# Patient Record
Sex: Female | Born: 1995 | Hispanic: No | Marital: Single | Smoking: Current every day smoker
Health system: Southern US, Community
[De-identification: ages and names within clinical notes are randomized; demographics above are authoritative.]

## PROBLEM LIST (undated history)

## (undated) ENCOUNTER — Emergency Department (HOSPITAL_COMMUNITY): Payer: MEDICAID

## (undated) VITALS — BP 124/88 | HR 79 | Temp 97.8°F | Resp 16 | Ht 64.96 in | Wt 159.8 lb

## (undated) VITALS — BP 108/66 | HR 118 | Temp 97.7°F | Resp 18 | Ht 61.61 in | Wt 155.4 lb

## (undated) DIAGNOSIS — F1320 Sedative, hypnotic or anxiolytic dependence, uncomplicated: Secondary | ICD-10-CM

## (undated) DIAGNOSIS — F32A Depression, unspecified: Secondary | ICD-10-CM

## (undated) DIAGNOSIS — T8859XA Other complications of anesthesia, initial encounter: Secondary | ICD-10-CM

## (undated) DIAGNOSIS — H539 Unspecified visual disturbance: Secondary | ICD-10-CM

## (undated) DIAGNOSIS — F1120 Opioid dependence, uncomplicated: Secondary | ICD-10-CM

## (undated) DIAGNOSIS — F1910 Other psychoactive substance abuse, uncomplicated: Secondary | ICD-10-CM

## (undated) DIAGNOSIS — E162 Hypoglycemia, unspecified: Secondary | ICD-10-CM

## (undated) DIAGNOSIS — E785 Hyperlipidemia, unspecified: Secondary | ICD-10-CM

## (undated) DIAGNOSIS — N809 Endometriosis, unspecified: Secondary | ICD-10-CM

## (undated) DIAGNOSIS — R519 Headache, unspecified: Secondary | ICD-10-CM

## (undated) DIAGNOSIS — K219 Gastro-esophageal reflux disease without esophagitis: Secondary | ICD-10-CM

## (undated) DIAGNOSIS — R55 Syncope and collapse: Secondary | ICD-10-CM

## (undated) DIAGNOSIS — F909 Attention-deficit hyperactivity disorder, unspecified type: Secondary | ICD-10-CM

## (undated) DIAGNOSIS — T4145XA Adverse effect of unspecified anesthetic, initial encounter: Secondary | ICD-10-CM

## (undated) DIAGNOSIS — F1410 Cocaine abuse, uncomplicated: Secondary | ICD-10-CM

## (undated) DIAGNOSIS — F419 Anxiety disorder, unspecified: Secondary | ICD-10-CM

## (undated) DIAGNOSIS — F329 Major depressive disorder, single episode, unspecified: Secondary | ICD-10-CM

## (undated) DIAGNOSIS — K819 Cholecystitis, unspecified: Secondary | ICD-10-CM

## (undated) DIAGNOSIS — N946 Dysmenorrhea, unspecified: Secondary | ICD-10-CM

## (undated) DIAGNOSIS — F1994 Other psychoactive substance use, unspecified with psychoactive substance-induced mood disorder: Secondary | ICD-10-CM

## (undated) DIAGNOSIS — D649 Anemia, unspecified: Secondary | ICD-10-CM

## (undated) DIAGNOSIS — Z720 Tobacco use: Secondary | ICD-10-CM

## (undated) DIAGNOSIS — E669 Obesity, unspecified: Secondary | ICD-10-CM

## (undated) HISTORY — DX: Hyperlipidemia, unspecified: E78.5

## (undated) HISTORY — DX: Syncope and collapse: R55

## (undated) HISTORY — DX: Dysmenorrhea, unspecified: N94.6

## (undated) HISTORY — DX: Cholecystitis, unspecified: K81.9

## (undated) HISTORY — DX: Other psychoactive substance abuse, uncomplicated: F19.10

## (undated) HISTORY — DX: Other complications of anesthesia, initial encounter: T88.59XA

## (undated) HISTORY — DX: Headache, unspecified: R51.9

## (undated) HISTORY — DX: Opioid dependence, uncomplicated: F11.20

## (undated) HISTORY — PX: WISDOM TOOTH EXTRACTION: SHX21

## (undated) HISTORY — PX: ADENOIDECTOMY: SUR15

---
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
## 2017-04-02 ENCOUNTER — Other Ambulatory Visit: Payer: Self-pay

## 2017-04-02 ENCOUNTER — Encounter (HOSPITAL_BASED_OUTPATIENT_CLINIC_OR_DEPARTMENT_OTHER): Payer: Self-pay | Admitting: *Deleted

## 2017-04-02 ENCOUNTER — Emergency Department (HOSPITAL_BASED_OUTPATIENT_CLINIC_OR_DEPARTMENT_OTHER)
Admission: EM | Admit: 2017-04-02 | Discharge: 2017-04-02 | Disposition: A | Discharge status: Home / Self Care | Payer: Medicaid Other | Attending: Emergency Medicine | Admitting: Emergency Medicine

## 2017-04-02 DIAGNOSIS — R51 Headache: Secondary | ICD-10-CM | POA: Insufficient documentation

## 2017-04-02 DIAGNOSIS — Z7722 Contact with and (suspected) exposure to environmental tobacco smoke (acute) (chronic): Secondary | ICD-10-CM | POA: Diagnosis not present

## 2017-04-02 DIAGNOSIS — M791 Myalgia, unspecified site: Secondary | ICD-10-CM | POA: Diagnosis not present

## 2017-04-02 LAB — BASIC METABOLIC PANEL
Anion gap: 6 (ref 5–15)
BUN: 13 mg/dL (ref 6–20)
CHLORIDE: 105 mmol/L (ref 101–111)
CO2: 28 mmol/L (ref 22–32)
CREATININE: 0.77 mg/dL (ref 0.44–1.00)
Calcium: 8.7 mg/dL — ABNORMAL LOW (ref 8.9–10.3)
GFR calc Af Amer: >60 mL/min (ref >60)
GFR calc non Af Amer: >60 mL/min (ref >60)
GLUCOSE: 76 mg/dL (ref 65–99)
Potassium: 3.7 mmol/L (ref 3.5–5.1)
Sodium: 139 mmol/L (ref 135–145)

## 2017-04-02 LAB — RAPID URINE DRUG SCREEN, HOSP PERFORMED
AMPHETAMINES: NOT DETECTED
BENZODIAZEPINES: POSITIVE — AB
Barbiturates: NOT DETECTED
Cocaine: NOT DETECTED
OPIATES: NOT DETECTED
Tetrahydrocannabinol: POSITIVE — AB
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
## 2017-04-02 NOTE — Discharge Instructions (Signed)
It was my pleasure taking care of you today!   Increase hydration.  Ibuprofen as needed for pain.  Call your primary care doctor if symptoms are not improved in the next day or two.  Return to ER for new or worsening symptoms, any additional concerns.

## 2017-04-02 NOTE — ED Notes (Signed)
Pt on monitor 
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
## 2017-04-03 ENCOUNTER — Encounter: Payer: Self-pay | Admitting: Obstetrics and Gynecology

## 2017-04-10 ENCOUNTER — Inpatient Hospital Stay (HOSPITAL_COMMUNITY)
Admission: RE | Admit: 2017-04-10 | Discharge: 2017-04-16 | DRG: 897 | Disposition: A | Discharge status: Home / Self Care | Payer: Medicaid Other | Attending: Psychiatry | Admitting: Psychiatry

## 2017-04-10 ENCOUNTER — Other Ambulatory Visit: Payer: Self-pay

## 2017-04-10 ENCOUNTER — Encounter (HOSPITAL_COMMUNITY): Payer: Self-pay | Admitting: *Deleted

## 2017-04-10 ENCOUNTER — Emergency Department (HOSPITAL_COMMUNITY)
Admission: EM | Admit: 2017-04-10 | Discharge: 2017-04-10 | Disposition: A | Discharge status: Home / Self Care | Payer: Medicaid Other | Attending: Emergency Medicine | Admitting: Emergency Medicine

## 2017-04-10 DIAGNOSIS — Z818 Family history of other mental and behavioral disorders: Secondary | ICD-10-CM

## 2017-04-10 DIAGNOSIS — Z885 Allergy status to narcotic agent status: Secondary | ICD-10-CM

## 2017-04-10 DIAGNOSIS — R1030 Lower abdominal pain, unspecified: Secondary | ICD-10-CM | POA: Diagnosis present

## 2017-04-10 DIAGNOSIS — Z810 Family history of intellectual disabilities: Secondary | ICD-10-CM | POA: Diagnosis not present

## 2017-04-10 DIAGNOSIS — F419 Anxiety disorder, unspecified: Secondary | ICD-10-CM | POA: Diagnosis present

## 2017-04-10 DIAGNOSIS — Z91018 Allergy to other foods: Secondary | ICD-10-CM | POA: Diagnosis not present

## 2017-04-10 DIAGNOSIS — F332 Major depressive disorder, recurrent severe without psychotic features: Secondary | ICD-10-CM | POA: Diagnosis present

## 2017-04-10 DIAGNOSIS — F1924 Other psychoactive substance dependence with psychoactive substance-induced mood disorder: Secondary | ICD-10-CM | POA: Diagnosis present

## 2017-04-10 DIAGNOSIS — F902 Attention-deficit hyperactivity disorder, combined type: Secondary | ICD-10-CM | POA: Diagnosis present

## 2017-04-10 DIAGNOSIS — Z5321 Procedure and treatment not carried out due to patient leaving prior to being seen by health care provider: Secondary | ICD-10-CM | POA: Diagnosis not present

## 2017-04-10 DIAGNOSIS — Z7722 Contact with and (suspected) exposure to environmental tobacco smoke (acute) (chronic): Secondary | ICD-10-CM | POA: Diagnosis present

## 2017-04-10 DIAGNOSIS — Z79899 Other long term (current) drug therapy: Secondary | ICD-10-CM | POA: Diagnosis not present

## 2017-04-10 DIAGNOSIS — Z915 Personal history of self-harm: Secondary | ICD-10-CM

## 2017-04-10 DIAGNOSIS — F1920 Other psychoactive substance dependence, uncomplicated: Secondary | ICD-10-CM | POA: Diagnosis present

## 2017-04-10 DIAGNOSIS — E785 Hyperlipidemia, unspecified: Secondary | ICD-10-CM | POA: Diagnosis present

## 2017-04-10 DIAGNOSIS — R45851 Suicidal ideations: Secondary | ICD-10-CM | POA: Diagnosis present

## 2017-04-10 DIAGNOSIS — Z91041 Radiographic dye allergy status: Secondary | ICD-10-CM

## 2017-04-10 DIAGNOSIS — Z560 Unemployment, unspecified: Secondary | ICD-10-CM | POA: Diagnosis not present

## 2017-04-10 DIAGNOSIS — F1994 Other psychoactive substance use, unspecified with psychoactive substance-induced mood disorder: Secondary | ICD-10-CM | POA: Diagnosis present

## 2017-04-10 LAB — COMPREHENSIVE METABOLIC PANEL
ALT: 11 U/L — ABNORMAL LOW (ref 14–54)
ANION GAP: 9 (ref 5–15)
AST: 16 U/L (ref 15–41)
Albumin: 4.1 g/dL (ref 3.5–5.0)
Alkaline Phosphatase: 43 U/L (ref 38–126)
BILIRUBIN TOTAL: 0.9 mg/dL (ref 0.3–1.2)
BUN: 7 mg/dL (ref 6–20)
CO2: 22 mmol/L (ref 22–32)
Calcium: 9.3 mg/dL (ref 8.9–10.3)
Chloride: 111 mmol/L (ref 101–111)
Creatinine, Ser: 0.73 mg/dL (ref 0.44–1.00)
Glucose, Bld: 91 mg/dL (ref 65–99)
POTASSIUM: 4.1 mmol/L (ref 3.5–5.1)
Sodium: 142 mmol/L (ref 135–145)
TOTAL PROTEIN: 6.3 g/dL — AB (ref 6.5–8.1)

## 2017-04-10 LAB — CBC
HEMATOCRIT: 40.7 % (ref 36.0–46.0)
HEMOGLOBIN: 12.9 g/dL (ref 12.0–15.0)
MCH: 26.8 pg (ref 26.0–34.0)
MCHC: 31.7 g/dL (ref 30.0–36.0)
MCV: 84.4 fL (ref 78.0–100.0)
Platelets: 214 10*3/uL (ref 150–400)
RBC: 4.82 MIL/uL (ref 3.87–5.11)
RDW: 13.9 % (ref 11.5–15.5)
WBC: 5.1 10*3/uL (ref 4.0–10.5)

## 2017-04-10 LAB — LIPASE, BLOOD: Lipase: 41 U/L (ref 11–51)

## 2017-04-10 LAB — HCG, QUANTITATIVE, PREGNANCY: hCG, Beta Chain, Quant, S: <1 m[IU]/mL (ref <5)

## 2017-04-10 MED ORDER — CHLORDIAZEPOXIDE HCL 25 MG PO CAPS
25.0000 mg | ORAL_CAPSULE | Freq: Four times a day (QID) | ORAL | Status: DC | PRN
  Administered 2017-04-10: 16:00:00 via ORAL

## 2017-04-10 MED ORDER — QUETIAPINE FUMARATE 50 MG PO TABS
50.0000 mg | ORAL_TABLET | Freq: Once | ORAL | Status: AC
  Administered 2017-04-10: 50 mg via ORAL
  Filled 2017-04-10 (×2): qty 1

## 2017-04-10 MED ORDER — CLONIDINE HCL 0.1 MG PO TABS
0.1000 mg | ORAL_TABLET | ORAL | Status: DC
  Filled 2017-04-10 (×2): qty 1

## 2017-04-10 MED ORDER — LOPERAMIDE HCL 2 MG PO CAPS: 2.0000 mg | ORAL_CAPSULE | ORAL | Status: DC | PRN

## 2017-04-10 MED ORDER — HYDROXYZINE HCL 25 MG PO TABS
25.0000 mg | ORAL_TABLET | Freq: Four times a day (QID) | ORAL | Status: AC | PRN
Start: 2017-04-10 — End: 2017-04-15
  Administered 2017-04-10 – 2017-04-15 (×12): 25 mg via ORAL
  Filled 2017-04-10 (×12): qty 1

## 2017-04-10 MED ORDER — DICYCLOMINE HCL 20 MG PO TABS: 20.0000 mg | ORAL_TABLET | Freq: Four times a day (QID) | ORAL | Status: DC | PRN

## 2017-04-10 MED ORDER — ADULT MULTIVITAMIN W/MINERALS CH
1.0000 | ORAL_TABLET | Freq: Every day | ORAL | Status: DC
  Administered 2017-04-11 – 2017-04-16 (×6): 1 via ORAL
  Filled 2017-04-10 (×10): qty 1

## 2017-04-10 MED ORDER — NAPROXEN 500 MG PO TABS
500.0000 mg | ORAL_TABLET | Freq: Two times a day (BID) | ORAL | Status: AC | PRN
  Administered 2017-04-11 – 2017-04-14 (×5): 500 mg via ORAL
  Filled 2017-04-10 (×5): qty 1

## 2017-04-10 MED ORDER — METHOCARBAMOL 500 MG PO TABS
500.0000 mg | ORAL_TABLET | Freq: Three times a day (TID) | ORAL | Status: DC | PRN
  Administered 2017-04-14: 500 mg via ORAL
  Filled 2017-04-10: qty 1

## 2017-04-10 MED ORDER — CHLORDIAZEPOXIDE HCL 25 MG PO CAPS
ORAL_CAPSULE | ORAL | Status: AC
Start: 2017-04-10 — End: 2017-04-10
  Filled 2017-04-10: qty 1

## 2017-04-10 MED ORDER — CLONIDINE HCL 0.1 MG PO TABS
0.1000 mg | ORAL_TABLET | Freq: Four times a day (QID) | ORAL | Status: DC
  Administered 2017-04-10 – 2017-04-11 (×2): 0.1 mg via ORAL
  Filled 2017-04-10 (×10): qty 1

## 2017-04-10 MED ORDER — CLONIDINE HCL 0.1 MG PO TABS: 0.1000 mg | ORAL_TABLET | Freq: Every day | ORAL | Status: DC

## 2017-04-10 MED ORDER — ONDANSETRON 4 MG PO TBDP: 4.0000 mg | ORAL_TABLET | Freq: Four times a day (QID) | ORAL | Status: AC | PRN
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
## 2017-04-10 NOTE — Progress Notes (Addendum)
Patient ID: [name], female   DOB: April 28, 1995, 22 y.o.   MRN: [id number]  Pt currently presents with a sad affect and cooperative behavior. Pt approaches nurses station multiple times reporting intermittent anxiety and requesting medications. Pt speech is slow, logical, no tremors or shaking noted. Pt reports difficulty sleeping during hospitalization, requests to take sleep aid. Reports she has taken Seroquel in the past. Pt blood pressure low, will consult with NP. Presents with signs and symptoms of withdrawal including fatigue, feeling like her heart is racing and anxiety.    Pt provided with medications per providers orders. Pt's labs and vitals were monitored throughout the night. Pt given a 1:1 about emotional and mental status. Pt supported and encouraged to express concerns and questions. Pt educated on medications and coping skills for anxiety. Provider notified of patients request.   Pt's safety ensured with 15 minute and environmental checks. Pt currently denies SI/HI and A/V hallucinations. Pt verbally agrees to seek staff if SI/HI or A/VH occurs and to consult with staff before acting on any harmful thoughts. Pt reports that coloring works to help decrease her anxiety. Will continue POC.

## 2017-04-10 NOTE — Tx Team (Signed)
Initial Treatment Plan 04/10/2017 4:15 PM [name] [id number]    PATIENT STRESSORS: Educational concerns Marital or family conflict Occupational concerns Substance abuse   PATIENT STRENGTHS: Average or above average intelligence Capable of independent living Communication skills Motivation for treatment/growth Physical Health Supportive family/friends   PATIENT IDENTIFIED PROBLEMS: Risk for self injurious behavior "I was suicidal, I had plan to OD on drugs".  Substance abuse "I drink 1/2 a bottle of Hennessy, 1 gram of cocaine and 7 xenax pill everyday".  Alteration in mood (Depressed, anxious, tearful)                 DISCHARGE CRITERIA:  Improved stabilization in mood, thinking, and/or behavior Verbal commitment to aftercare and medication compliance  PRELIMINARY DISCHARGE PLAN: Outpatient therapy Return to previous living arrangement  PATIENT/FAMILY INVOLVEMENT: This treatment plan has been presented to and reviewed with the patient, [name].  The patient have been given the opportunity to ask questions and make suggestions.  Sherryl MangesWesseh, Lovell Roe, RN 04/10/2017, 4:15 PM

## 2017-04-10 NOTE — ED Triage Notes (Signed)
To ED for eval of lower abd pain. States she has hx of this pain but seems worse today. No vomiting. No diarrhea. States she was told a couple of weeks ago that she was pregnant but unknown how far. Denies vaginal discharge. Hx of endometriosis.
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
## 2017-04-10 NOTE — [facility] Group Notes (Signed)
[facility] Group Notes:  (Nursing/MHT/Case Management/Adjunct)  Date:  04/10/2017  Time:  4:00 pm  Type of Therapy:  Psychoeducational Skills  Participation Level:  Did Not Attend  Participation Quality:    Affect:    Cognitive:    Insight:    Engagement in Group:    Modes of Intervention:    Summary of Progress/Problems:  Earline MayotteKnight, Ariza Evans Shephard 04/10/2017, 7:20 PM

## 2017-04-10 NOTE — H&P (Signed)
Behavioral Health Medical Screening Exam  [name] is an 22 y.o. female presents as walk in with complaints of suicidal ideation; worsening depression, and polysubstance use.  Patient unable to contract for safety   Total Time spent with patient: 45 minutes  Psychiatric Specialty Exam: Physical Exam  Vitals reviewed. Constitutional: She is oriented to person, place, and time.  Neck: Normal range of motion.  Respiratory: Effort normal.  Musculoskeletal: Normal range of motion.  Neurological: She is alert and oriented to person, place, and time.  Skin: Skin is warm and dry.  Psychiatric: Her speech is normal and behavior is normal. She expresses impulsivity. She exhibits a depressed mood. She expresses suicidal ideation. She expresses suicidal plans.    Review of Systems  Psychiatric/Behavioral: Positive for depression, substance abuse and suicidal ideas.  All other systems reviewed and are negative.   Blood pressure 109/67, pulse 72, temperature 98 F (36.7 C), resp. rate 16, last menstrual period 04/02/2017, SpO2 100 %.There is no height or weight on file to calculate BMI.  General Appearance: Casual  Eye Contact:  Good  Speech:  Clear and Coherent and Normal Rate  Volume:  Normal  Mood:  Depressed and Hopeless  Affect:  Depressed and Flat  Thought Process:  Coherent and Goal Directed  Orientation:  Full (Time, Place, and Person)  Thought Content:  Denies hallucinations, delusions, and paranoia  Suicidal Thoughts:  Yes.  with intent/plan  Homicidal Thoughts:  No  Memory:  Immediate;   Good Recent;   Good Remote;   Good  Judgement:  Impaired  Insight:  Lacking and Shallow  Psychomotor Activity:  Normal  Concentration: Concentration: Fair and Attention Span: Fair  Recall:  Good  Fund of Knowledge:Fair  Language: Good  Akathisia:  No  Handed:  Right  AIMS (if indicated):     Assets:  Communication Skills Desire for Improvement Intimacy Social Support  Sleep:        Musculoskeletal: Strength & Muscle Tone: within normal limits Gait & Station: normal Patient leans: N/A  Blood pressure 109/67, pulse 72, temperature 98 F (36.7 C), resp. rate 16, last menstrual period 04/02/2017, SpO2 100 %.  Recommendations:  Inpatient psychiatric treatment  Based on my evaluation the patient does not appear to have an emergency medical condition.  Shuvon Rankin, NP 04/10/2017, 12:24 PM

## 2017-04-11 DIAGNOSIS — Z915 Personal history of self-harm: Secondary | ICD-10-CM

## 2017-04-11 DIAGNOSIS — F332 Major depressive disorder, recurrent severe without psychotic features: Secondary | ICD-10-CM

## 2017-04-11 DIAGNOSIS — F1390 Sedative, hypnotic, or anxiolytic use, unspecified, uncomplicated: Secondary | ICD-10-CM

## 2017-04-11 DIAGNOSIS — F1490 Cocaine use, unspecified, uncomplicated: Secondary | ICD-10-CM

## 2017-04-11 DIAGNOSIS — R45851 Suicidal ideations: Secondary | ICD-10-CM

## 2017-04-11 DIAGNOSIS — F419 Anxiety disorder, unspecified: Secondary | ICD-10-CM

## 2017-04-11 DIAGNOSIS — Z813 Family history of other psychoactive substance abuse and dependence: Secondary | ICD-10-CM

## 2017-04-11 DIAGNOSIS — R450 Nervousness: Secondary | ICD-10-CM

## 2017-04-11 DIAGNOSIS — Z818 Family history of other mental and behavioral disorders: Secondary | ICD-10-CM

## 2017-04-11 DIAGNOSIS — F1920 Other psychoactive substance dependence, uncomplicated: Secondary | ICD-10-CM | POA: Diagnosis present

## 2017-04-11 DIAGNOSIS — F1994 Other psychoactive substance use, unspecified with psychoactive substance-induced mood disorder: Secondary | ICD-10-CM

## 2017-04-11 LAB — LIPID PANEL
CHOLESTEROL: 163 mg/dL (ref 0–200)
HDL: 71 mg/dL (ref >40)
LDL Cholesterol: 81 mg/dL (ref 0–99)
TRIGLYCERIDES: 53 mg/dL (ref <150)
Total CHOL/HDL Ratio: 2.3 RATIO
VLDL: 11 mg/dL (ref 0–40)

## 2017-04-11 LAB — TSH: TSH: 1.037 u[IU]/mL (ref 0.350–4.500)

## 2017-04-11 LAB — ETHANOL: Alcohol, Ethyl (B): <10 mg/dL (ref <10)

## 2017-04-11 MED ORDER — GABAPENTIN 600 MG PO TABS
300.0000 mg | ORAL_TABLET | Freq: Three times a day (TID) | ORAL | Status: DC
  Filled 2017-04-11 (×2): qty 0.5

## 2017-04-11 MED ORDER — QUETIAPINE FUMARATE 25 MG PO TABS
25.0000 mg | ORAL_TABLET | Freq: Every day | ORAL | Status: DC
  Administered 2017-04-12 – 2017-04-16 (×5): 25 mg via ORAL
  Filled 2017-04-11 (×3): qty 1
  Filled 2017-04-11: qty 7
  Filled 2017-04-11: qty 1
  Filled 2017-04-11: qty 7
  Filled 2017-04-11 (×2): qty 1

## 2017-04-11 MED ORDER — GABAPENTIN 300 MG PO CAPS
300.0000 mg | ORAL_CAPSULE | Freq: Three times a day (TID) | ORAL | Status: DC
  Administered 2017-04-11 – 2017-04-13 (×6): 300 mg via ORAL
  Filled 2017-04-11 (×13): qty 1

## 2017-04-11 MED ORDER — LORAZEPAM 1 MG PO TABS
1.0000 mg | ORAL_TABLET | Freq: Four times a day (QID) | ORAL | Status: DC | PRN
  Filled 2017-04-11: qty 1

## 2017-04-11 MED ORDER — QUETIAPINE FUMARATE 100 MG PO TABS
100.0000 mg | ORAL_TABLET | Freq: Every day | ORAL | Status: DC
  Administered 2017-04-11 – 2017-04-15 (×5): 100 mg via ORAL
  Filled 2017-04-11: qty 1
  Filled 2017-04-11: qty 7
  Filled 2017-04-11 (×2): qty 1
  Filled 2017-04-11: qty 7
  Filled 2017-04-11 (×4): qty 1

## 2017-04-11 MED ORDER — BUPROPION HCL ER (XL) 150 MG PO TB24
150.0000 mg | ORAL_TABLET | Freq: Every day | ORAL | Status: DC
  Administered 2017-04-11: 150 mg via ORAL
  Filled 2017-04-11 (×3): qty 1

## 2017-04-11 MED ORDER — CHLORDIAZEPOXIDE HCL 25 MG PO CAPS
25.0000 mg | ORAL_CAPSULE | Freq: Four times a day (QID) | ORAL | Status: DC | PRN
  Administered 2017-04-12: 25 mg via ORAL
  Filled 2017-04-11: qty 1

## 2017-04-11 NOTE — Progress Notes (Signed)
[facility] called [facility] informed that urine specimen was not labeled correctly so it had to be rejected.

## 2017-04-11 NOTE — [facility] Suicide Risk Assessment (Signed)
[facility] Ad[facility]   Nursing information obtained from:    Demographic factors:    Current Mental Status:    Loss Factors:    Historical Factors:    Risk Reduction Factors:     Total Time spent with patient: 45 minutes Principal Problem: Substance Induced Mood Disorder Diagnosis:   Patient Active Problem List   Diagnosis Date Noted  . Polysubstance (excluding opioids) dependence (HCC) [F19.20] 04/11/2017  . MDD (major depressive disorder), recurrent severe, without psychosis (HCC) [F33.2] 04/10/2017  . Post-operative pain [G89.18] 08/26/2014  . Pain, dental [K08.89]   . Cellulitis and abscess of oral soft tissues [K12.2]   . Major depressive disorder, recurrent episode, severe (HCC) [F33.2] 03/15/2012  . GAD (generalized anxiety disorder) [F41.1] 03/15/2012  . 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 ADHD (attention deficit hyperactivity disorder), combined type [F90.2] 03/15/2012  . Headache(784.0) [R51] 09/18/2011  . Palpitations [R00.2] 09/18/2011  . Rapid weight loss [R63.4] 09/18/2011  . Nausea & vomiting [R11.2] 09/17/2011  . Epigastric abdominal pain [R10.13] 09/17/2011  . Pilonidal abscess [L05.01] 04/20/2011  . Chronic constipation [K59.09] 04/20/2011  . Allergy history, radiographic dye [Z91.041] 04/20/2011  . Status post laparoscopic cholecystectomy [Z90.49] 03/10/2011   Subjective Data:  22 y.o Caucasian female, single, lives with her grand-mother, unemployed. Background history of SUD and mood disorder. Presented to the ER in company of her sister. Expressed increased anxiety and thoughts of suicide. Reports daily use of substances. Says she was anxious because she could not get any substance to use. Felt like killing herself. Denies any new stressor. Patient has past history of overdose on illicit substance. Says she wants to attend any program that would help her feel better. No hallucination in any modality. No persecutory delusion. No  grandiose delusion. No other form of delusion. No passivity of thought. No passivity of will.  No evidence of mania. No cognitive impairment. No access to weapons. She is cooperative with care. She has agreed to treatment recommendations. She has agreed to communicate suicidal thoughts to staff if the thoughts becomes overwhelming.     Continued Clinical Symptoms:  Alcohol Use Disorder Identification Test Final Score (AUDIT): 26 The "Alcohol Use Disorders Identification Test", Guidelines for Use in Primary Care, Second Edition.  World Science writerHealth Organization [facility](WHO). Score between 0-7:  no or low risk or alcohol related problems. Score between 8-15:  moderate risk of alcohol related problems. Score between 16-19:  high risk of alcohol related problems. Score 20 or above:  warrants further diagnostic evaluation for alcohol dependence and treatment.   CLINICAL FACTORS:   Alcohol/Substance Abuse/Dependencies   Musculoskeletal: Strength & Muscle Tone: within normal limits Gait & Station: normal Patient leans: N/A  Psychiatric Specialty Exam: Physical Exam  Constitutional: She is oriented to person, place, and time. She appears well-developed and well-nourished.  HENT:  Head: Normocephalic and atraumatic.  Respiratory: Effort normal.  Neurological: She is alert and oriented to person, place, and time.  Psychiatric:  As above    ROS  Blood pressure (!) 98/53, pulse 63, temperature 97.8 F (36.6 C), resp. rate 18, height 5' 3.5" (1.613 m), weight 65.3 kg (144 lb), last menstrual period 04/02/2017, SpO2 100 %.Body mass index is 25.11 kg/m.  General Appearance: Not shaky, not sweaty, not confused. Not unsteady, normal conjugate eye movements. Not internally distressed. Appropriate behavior.   Eye Contact:  Good  Speech:  Clear and Coherent and Normal Rate  Volume:  Normal  Mood:  Dysphoric  Affect:  Restricted  Thought Process:  Linear  Orientation:  Full (Time, Place, and Person)   Thought Content:  Preoccupied with getting drugs. No delusional theme. No preoccupation with violent thoughts. No negative ruminations. No obsession.  No hallucination in any modality.   Suicidal Thoughts:  None currently  Homicidal Thoughts:  No  Memory:  Immediate;   Good Recent;   Fair Remote;   Fair  Judgement:  Impaired  Insight:  Shallow  Psychomotor Activity:  Restlessness  Concentration:  Concentration: Fair and Attention Span: Fair  Recall:  [employer] of Knowledge:  Good  Language:  Good  Akathisia:  Negative  Handed:    AIMS (if indicated):     Assets:  Housing Physical Health Resilience  ADL's:  Intact  Cognition:  WNL  Sleep:  Number of Hours: 6      COGNITIVE FEATURES THAT CONTRIBUTE TO RISK:  None    SUICIDE RISK:   Minimal: No identifiable suicidal ideation.  Patients presenting with no risk factors but with morbid ruminations; may be classified as minimal risk based on the severity of the depressive symptoms  PLAN OF CARE: 1. Alcohol and opiate withdrawal protocol 2. Seroquel 25 mg daily and 100 mg at bedtime 3. Q 15 minute check for suicide. 4. Monitor mood, behavior and interaction with peers 5. SW would gather collateral from his family 6.  SW would facilitate inpatient rehab placement   I certify that inpatient services furnished can reasonably be expected to improve the patient's condition.   Georgiann Cocker, MD 04/11/2017, 3:22 PM

## 2017-04-11 NOTE — Progress Notes (Signed)
Patient at nurse's station complaining of dizziness following lunch. Pt reports that she is drinking and did "eat some chicken". Walked pt to her room and blood pressure rechecked -98/53- Refilled pt's pitcher and encouraged pt to drink fluids NP notified  . Held scheduled clonidine per NP.  Pt administered prn vistaril for anxiety.

## 2017-04-11 NOTE — [facility] Group Notes (Signed)
  Identifying Needs  Date:  04/11/2017  Time:  9:15 PM  Type of Therapy:  Nurse Education  /  The group focuses on teaching patients how to identify their needs and then how to develop the skills needed to get them met.   Participation Level:  Did Not Attend  Participation Quality:    Affect:    Cognitive:    Insight:    Engagement in Group:    Modes of Intervention:    Summary of Progress/Problems:  Lauralyn Primes 04/11/2017, 9:15 PM
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
## 2017-04-11 NOTE — Progress Notes (Signed)
D. Pt presents with an anxious affect and cooperative behavior-Observed in milieu interacting with peers. Pt reports mild withdrawal symptoms (chilling), but appears to be improving. Per pt's self inventory, pt rates her depression, hopelessness and anxiety a 7/8/8, respectively. Pt currently endorses intermittent passive SI, but agrees to contact staff before acting on any harmful thoughts. Pt writes that her goal today today to work on is "anxiety" and "learn coping skills". A. Labs and vitals monitored. Pt compliant with medications. Pt supported emotionally and encouraged to express concerns and ask questions.   R. Pt remains safe with 15 minute checks. Will continue POC.

## 2017-04-11 NOTE — [facility] Group Notes (Signed)
[facility] Group Notes: (Clinical Social Work)   04/11/2017      Type of Therapy:  Group Therapy   Participation Level:  Did Not Attend despite MHT prompting   Ambrose MantleMareida Grossman-Orr, LCSW 04/11/2017, 12:31 PM
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
## 2017-04-11 NOTE — Progress Notes (Signed)
Patient did attend the evening speaker AA meeting.  

## 2017-04-11 NOTE — Progress Notes (Signed)
Writer spoke with patient 1:1 at nursing station. She c/o feeling very anxious since taking wellbutrin earlier. She received a prn of vistaril. She reports that today has been much better than yesterday. Writer informed her that she will need to take in plenty of water and gatorade d/t low blood pressures. She reported that her pitcher was still full. Support given and safety maintained on unit with 15 min chercks.

## 2017-04-12 MED ORDER — DOCUSATE SODIUM 100 MG PO CAPS
100.0000 mg | ORAL_CAPSULE | Freq: Every day | ORAL | Status: DC
  Administered 2017-04-12 – 2017-04-16 (×5): 100 mg via ORAL
  Filled 2017-04-12 (×8): qty 1

## 2017-04-12 NOTE — Progress Notes (Signed)
[facility]  04/12/2017 12:34 PM [name]  MRN:  [id number] Subjective:   22 y.o Caucasian female, single, lives with her grand-mother, unemployed. Background history of SUD and mood disorder. Presented to the ER in company of her sister. Expressed increased anxiety and thoughts of suicide. Reports daily use of substances. No UDS on file.  Chart reviewed today. Patient discussed at team today.  Staff reports that she has been to few groups. No internal stimulation. No abnormal behavior. She has not voiced any suicidal thoughts here.  Seen today. Says she struggled to sleep last night. She has been napping this morning. Feels slightly unsteady when she got out of bed. Says it has normalized. No falls. She has been taking Hydroxyzine and Seroquel. Says Gabapentin makes her oversedated. Would prefer to take it at night only.  No hallucination in nay modality. No current suicidal thoughts. Encouraged    Principal Problem: Substance induced mood disorder (HCC) Diagnosis:   Patient Active Problem List   Diagnosis Date Noted  . Polysubstance (excluding opioids) dependence (HCC) [F19.20] 04/11/2017  . MDD (major depressive disorder), recurrent severe, without psychosis (HCC) [F33.2] 04/10/2017  . Post-operative pain [G89.18] 08/26/2014  . Pain, dental [K08.89]   . Cellulitis and abscess of oral soft tissues [K12.2]   . Substance induced mood disorder (HCC) [F19.94] 03/15/2012  . GAD (generalized anxiety disorder) [F41.1] 03/15/2012  . 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 ADHD (attention deficit hyperactivity disorder), combined type [F90.2] 03/15/2012  . Headache(784.0) [R51] 09/18/2011  . Palpitations [R00.2] 09/18/2011  . Rapid weight loss [R63.4] 09/18/2011  . Nausea & vomiting [R11.2] 09/17/2011  . Epigastric abdominal pain [R10.13] 09/17/2011  . Pilonidal abscess [L05.01] 04/20/2011  . Chronic constipation [K59.09] 04/20/2011  . Allergy history, radiographic dye  [Z91.041] 04/20/2011  . Status post laparoscopic cholecystectomy [Z90.49] 03/10/2011   Total Time spent with patient: 20 minutes  Past Psychiatric History: As in H&P  Past Medical History:  Past Medical History:  Diagnosis Date  . ADHD (attention deficit hyperactivity disorder)   . Anesthesia complication    woke up fighting after tonsillectomy  . Anxiety   . Cholecystitis   . Depression   . Dysmenorrhea   . GERD (gastroesophageal reflux disease)   . Hyperlipidemia   . Hypoglycemia   . Obesity   . Syncope   . Viral warts    hand  . Vision abnormalities    wears glasses    Past Surgical History:  Procedure Laterality Date  . CHOLECYSTECTOMY  03/21/2011   Procedure: LAPAROSCOPIC CHOLECYSTECTOMY;  Surgeon: Shelly Rubensteinouglas A Blackman, MD;  Location: [facility] OR;  Service: General;  Laterality: N/A;  . ESOPHAGOGASTRODUODENOSCOPY  09/26/2011   Procedure: ESOPHAGOGASTRODUODENOSCOPY (EGD);  Surgeon: Jon GillsJoseph H Clark, MD;  [facility];  Service: Gastroenterology;  Laterality: N/A;  . TONSILLECTOMY AND ADENOIDECTOMY  06/2005  . WISDOM TOOTH EXTRACTION     Family History:  Family History  Problem Relation Age of Onset  . Anesthesia problems Maternal Grandfather   . Heart disease Maternal Grandfather   . Nephrolithiasis Maternal Grandfather   . Diabetes Maternal Grandfather   . Mental illness Maternal Grandfather   . Cholelithiasis Mother   . Nephrolithiasis Mother   . Depression Mother   . Hypertension Mother   . Miscarriages / [place] Mother   . Anxiety disorder Mother   . Nephrolithiasis Maternal Grandmother   . COPD Maternal Grandmother   . Heart disease Paternal Grandfather   . Cholelithiasis Maternal Aunt   .  Depression Maternal Aunt   . Learning disabilities Maternal Aunt   . Bipolar disorder Sister    Family Psychiatric  History: As in H&P Social History:  Social History   Substance and Sexual Activity  Alcohol Use No     Social History   Substance and Sexual  Activity  Drug Use No    Social History   Socioeconomic History  . Marital status: Single    Spouse name: None  . Number of children: None  . Years of education: None  . Highest education level: None  Social Needs  . Financial resource strain: None  . Food insecurity - worry: None  . Food insecurity - inability: None  . Transportation needs - medical: None  . Transportation needs - non-medical: None  Occupational History  . 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 Smoking status: Passive Smoke Exposure - Never Smoker  . Smokeless tobacco: Never Used  Substance and Sexual Activity  . Alcohol use: No  . Drug use: No  . Sexual activity: Not Currently  Other Topics Concern  . None  Social History Narrative   Repeating 9th grade (missed 40 days last year despite homebound instruction)   Additional Social History:                         Sleep: Fair  Appetite:  Fair  Current Medications: Current Facility-Administered Medications  Medication Dose Route Frequency Provider Last Rate Last Dose  . chlordiazePOXIDE (LIBRIUM) capsule 25 mg  25 mg Oral QID PRN Georgiann Cocker, MD   25 mg at 04/12/17 0910  . dicyclomine (BENTYL) tablet 20 mg  20 mg Oral Q6H PRN Rankin, Shuvon B, NP      . gabapentin (NEURONTIN) capsule 300 mg  300 mg Oral TID Cobos, Rockey Situ, MD   300 mg at 04/12/17 0750  . hydrOXYzine (ATARAX/VISTARIL) tablet 25 mg  25 mg Oral Q6H PRN Rankin, Shuvon B, NP   25 mg at 04/12/17 0617  . loperamide (IMODIUM) capsule 2-4 mg  2-4 mg Oral PRN Rankin, Shuvon B, NP      . methocarbamol (ROBAXIN) tablet 500 mg  500 mg Oral Q8H PRN Rankin, Shuvon B, NP      . multivitamin with minerals tablet 1 tablet  1 tablet Oral Daily Rankin, Shuvon B, NP   1 tablet at 04/12/17 0750  . naproxen (NAPROSYN) tablet 500 mg  500 mg Oral BID PRN Rankin, Shuvon B, NP   500 mg at 04/12/17 0751  . ondansetron (ZOFRAN-ODT) disintegrating tablet 4 mg  4  mg Oral Q6H PRN Rankin, Shuvon B, NP      . QUEtiapine (SEROQUEL) tablet 100 mg  100 mg Oral QHS Charm Rings, NP   100 mg at 04/11/17 2126  . QUEtiapine (SEROQUEL) tablet 25 mg  25 mg Oral Daily Izediuno, Delight Ovens, MD   25 mg at 04/12/17 0750    Lab Results:  Results for orders placed or performed during the hospital encounter of 04/10/17 (from the past 48 hour(s))  TSH     Status: None   Collection Time: 04/11/17  6:25 AM  Result Value Ref Range   TSH 1.037 0.350 - 4.500 uIU/mL    Comment: Performed by a 3rd Generation assay with a functional sensitivity of <=0.01 uIU/mL. Performed at [facility], [address], [place], [place] [address]   Ethanol  Status: None   Collection Time: 04/11/17  6:25 AM  Result Value Ref Range   Alcohol, Ethyl (B) <10 <10 mg/dL    Comment:        LOWEST DETECTABLE LIMIT FOR SERUM ALCOHOL IS 10 mg/dL FOR MEDICAL PURPOSES ONLY Performed at [facility], [address], [place], [place] [address]   Lipid panel     Status: None   Collection Time: 04/11/17  6:25 AM  Result Value Ref Range   Cholesterol 163 0 - 200 mg/dL   Triglycerides 53 <604 mg/dL   HDL 71 >54 mg/dL   Total CHOL/HDL Ratio 2.3 RATIO   VLDL 11 0 - 40 mg/dL   LDL Cholesterol 81 0 - 99 mg/dL    Comment:        Total Cholesterol/HDL:CHD Risk Coronary Heart Disease Risk Table                     Men   Women  1/2 Average Risk   3.4   3.3  Average Risk       5.0   4.4  2 X Average Risk   9.6   7.1  3 X Average Risk  23.4   11.0        Use the calculated Patient Ratio above and the CHD Risk Table to determine the patient's CHD Risk.        ATP III CLASSIFICATION (LDL):  <100     mg/dL   Optimal  098-119  mg/dL   Near or Above                    Optimal  130-159  mg/dL   Borderline  147-829  mg/dL   High  >562     mg/dL   Very High Performed at [facility], [address] [place], [place] [address]     Blood  Alcohol level:  Lab Results  Component Value Date   [facility] <10 04/11/2017   ETH <11 12/09/2012    Metabolic Disorder Labs: Lab Results  Component Value Date   HGBA1C  04/20/2010    5.4 (NOTE)                                                                       According to the ADA Clinical Practice Recommendations for 2011, when HbA1c is used as a screening test:   >=6.5%   Diagnostic of Diabetes Mellitus           (if abnormal result  is confirmed)  5.7-6.4%   Increased risk of developing Diabetes Mellitus  References:Diagnosis and Classification of Diabetes Mellitus,Diabetes Care,2011,34(Suppl 1):S62-S69 and Standards of Medical Care in         Diabetes - 2011,Diabetes Care,2011,34  (Suppl 1):S11-S61.   MPG 108 04/20/2010   Lab Results  Component Value Date   PROLACTIN 9.4 03/16/2012   Lab Results  Component Value Date   CHOL 163 04/11/2017   TRIG 53 04/11/2017   HDL 71 04/11/2017   CHOLHDL 2.3 04/11/2017   VLDL 11 04/11/2017   LDLCALC 81 04/11/2017   LDLCALC 139 (H) 12/10/2012    Physical Findings: AIMS: Facial and Oral Movements Muscles of Facial  Expression: None, normal Lips and Perioral Area: None, normal Jaw: None, normal Tongue: None, normal,Extremity Movements Upper (arms, wrists, hands, fingers): None, normal Lower (legs, knees, ankles, toes): None, normal, Trunk Movements Neck, shoulders, hips: None, normal, Overall Severity Severity of abnormal movements (highest score from questions above): None, normal Incapacitation due to abnormal movements: None, normal Patient's awareness of abnormal movements (rate only patient's report): No Awareness, Dental Status Current problems with teeth and/or dentures?: Yes Does patient usually wear dentures?: Yes  CIWA:  CIWA-Ar Total: 4 COWS:  COWS Total Score: 3  Musculoskeletal: Strength & Muscle Tone: within normal limits Gait & Station: normal Patient leans: N/A  Psychiatric Specialty Exam: Physical Exam   Constitutional: She is oriented to person, place, and time. She appears well-developed and well-nourished.  HENT:  Head: Normocephalic and atraumatic.  Respiratory: Effort normal.  Neurological: She is alert and oriented to person, place, and time.  Psychiatric:  As above     ROS  Blood pressure (!) 107/53, pulse 62, temperature 98 F (36.7 C), resp. rate 16, height 5' 3.5" (1.613 m), weight 65.3 kg (144 lb), last menstrual period 04/02/2017, SpO2 100 %.Body mass index is 25.11 kg/m.  General Appearance: Casual  Eye Contact:  Good  Speech:  Clear and Coherent  Volume:  Decreased  Mood:  Anxious  Affect:  Blunted   Thought Process:  Linear  Orientation:  Full (Time, Place, and Person)  Thought Content:  Rumination  Suicidal Thoughts:  No  Homicidal Thoughts:  No  Memory:  Immediate;   Good Recent;   Fair Remote;   Good  Judgement:  Good  Insight:  Shallow  Psychomotor Activity:  Decreased  Concentration:  Concentration: Fair and Attention Span: Fair  Recall:  [employer] of Knowledge:  Good  Language:  Good  Akathisia:  Negative  Handed:    AIMS (if indicated):     Assets:  Communication Skills Physical Health Resilience  ADL's:  Intact  Cognition:  WNL  Sleep:  Number of Hours: 5.25     Treatment Plan Summary: Patient is still coming off psychoactive substances. Unclear what she was intoxicated with as she has refused to give her urine sample. She is not psychotic or manic. We plan to adjust medications as below and evaluate her further.  Psychiatric: SIMD SUD  Medical:  Psychosocial:   PLAN: 1. Gabapentin 300 mg at bedtime only 2. Continue other medications at current dose 3 Encourage unit groups and therapeutic activities 4. Monitor mood, behavior and interaction with peers 5. Motivational enhancement     Georgiann Cocker, MD 04/12/2017, 12:34 PM

## 2017-04-12 NOTE — Progress Notes (Signed)
Patient did attend the evening speaker AA meeting.  

## 2017-04-12 NOTE — [facility] Group Notes (Signed)
[facility] Group Notes: (Clinical Social Work)   04/12/2017      Type of Therapy:  Group Therapy   Participation Level:  Did Not Attend despite MHT prompting   Timothee Gali Grossman-Orr, LCSW 04/12/2017, 1:00 PM     

## 2017-04-12 NOTE — Progress Notes (Signed)
D. Pt presents with an anxious affect and congruent mood- observed sitting in dayroom interacting well with student nurse. Pt given prune juice for complaints of constipation. Per pt's self inventory, pt rates her depression, hopelessness and anxiety a 7/9/8, respectively. Pt writes that her most important goal today to work on is "depression-learning coping skills". Pt currently denies SI/HI and AVH  A. Labs and vitals monitored. Pt given compliant medications. Pt supported emotionally and encouraged to express concerns and ask questions.   R. Pt remains safe with 15 minute checks. Will continue POC.

## 2017-04-12 NOTE — Progress Notes (Signed)
Writer spoke with patient 1;1 at medication window. She c/o body aches and reports experiencing chills during the day. She attended group and reports that the vistaril really helps with her anxiety but the Neurontin causes her to be sleepy during the day. She had her sister and 2 friends to visit on tonight. She reports that she has been working on her goal today. Support given and safety maintained on unit with 15 min checks.

## 2017-04-12 NOTE — [facility] Group Notes (Signed)
[facility] Group Notes:  (Nursing)  Date:  04/12/2017  Time: 8:30 Type of Therapy:  Orientation  Participation Level:  Active  Participation Quality:  Appropriate  Affect:  Appropriate  Cognitive:  Alert and Appropriate  Insight:  Appropriate  Engagement in Group:  Engaged  Modes of Intervention:  Discussion and Orientation  Summary of Progress/Problems:  Shela NevinValerie S Sharne Linders 04/12/2017, 7:40 PM

## 2017-04-13 DIAGNOSIS — Z560 Unemployment, unspecified: Secondary | ICD-10-CM

## 2017-04-13 MED ORDER — GABAPENTIN 300 MG PO CAPS
300.0000 mg | ORAL_CAPSULE | Freq: Every day | ORAL | Status: DC
  Administered 2017-04-14 – 2017-04-15 (×2): 300 mg via ORAL
  Filled 2017-04-13: qty 7
  Filled 2017-04-13: qty 1
  Filled 2017-04-13: qty 7
  Filled 2017-04-13 (×2): qty 1

## 2017-04-13 MED ORDER — MAGNESIUM HYDROXIDE 400 MG/5ML PO SUSP
30.0000 mL | Freq: Every day | ORAL | Status: DC | PRN
  Administered 2017-04-13: 30 mL via ORAL

## 2017-04-13 NOTE — Progress Notes (Signed)
[facility] MD Progress Note  04/13/2017 2:43 PM [name]  MRN:  [id number]  Subjective:   22 y.o Caucasian female, single, lives with her grandmother, unemployed. Pt has a background history of SUD and mood disorder. She presented ER for increasing anxiety and thoughts of overdosing. Currently denies SI but has passive thoughts of self harm without a plan. Contracts for safety on the unit. Denies HI . Her primary stressor has been with using illicit drugs including hydrocodone (previously prescribed), cocaine, ecstasy, and xanax. She feels as if she is becoming like her mother who died as a result drug abuse. No tactile, auditory, or visual hallucinations expressed. No tremor.   Of note, she complains of increasing anxiety when around others. She states "feeling my heart racing when 3 or 4 people are sit around me". As per nurse note, patient seen in milieu interacting well with others.  She slept better with medication change. Gabapentin continues to make her feel oversedated. Patient also reports increase in mood swings after receiving a call from her sister. Says her mood is improving. Charted by MD.student Christiane Ha. Reviewed above Gorden Harms NP   Objective:  No UDS on file.   Principal Problem: Substance induced mood disorder (HCC) Diagnosis:   Patient Active Problem List   Diagnosis Date Noted  . Polysubstance (excluding opioids) dependence (HCC) [F19.20] 04/11/2017  . MDD (major depressive disorder), recurrent severe, without psychosis (HCC) [F33.2] 04/10/2017  . Post-operative pain [G89.18] 08/26/2014  . Pain, dental [K08.89]   . Cellulitis and abscess of oral soft tissues [K12.2]   . Substance induced mood disorder (HCC) [F19.94] 03/15/2012  . GAD (generalized anxiety disorder) [F41.1] 03/15/2012  . 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 ADHD (attention deficit hyperactivity disorder), combined type [F90.2] 03/15/2012  . Headache(784.0) [R51] 09/18/2011  . Palpitations  [R00.2] 09/18/2011  . Rapid weight loss [R63.4] 09/18/2011  . Nausea & vomiting [R11.2] 09/17/2011  . Epigastric abdominal pain [R10.13] 09/17/2011  . Pilonidal abscess [L05.01] 04/20/2011  . Chronic constipation [K59.09] 04/20/2011  . Allergy history, radiographic dye [Z91.041] 04/20/2011  . Status post laparoscopic cholecystectomy [Z90.49] 03/10/2011   Total Time spent with patient: 30 minutes  Past Psychiatric History: As per H&P  Past Medical History:  Past Medical History:  Diagnosis Date  . ADHD (attention deficit hyperactivity disorder)   . Anesthesia complication    woke up fighting after tonsillectomy  . Anxiety   . Cholecystitis   . Depression   . Dysmenorrhea   . GERD (gastroesophageal reflux disease)   . Hyperlipidemia   . Hypoglycemia   . Obesity   . Syncope   . Viral warts    hand  . Vision abnormalities    wears glasses    Past Surgical History:  Procedure Laterality Date  . CHOLECYSTECTOMY  03/21/2011   Procedure: LAPAROSCOPIC CHOLECYSTECTOMY;  Surgeon: Shelly Rubenstein, MD;  Location: [facility] OR;  Service: General;  Laterality: N/A;  . ESOPHAGOGASTRODUODENOSCOPY  09/26/2011   Procedure: ESOPHAGOGASTRODUODENOSCOPY (EGD);  Surgeon: Jon Gills, MD;  Location: [facility] OR;  Service: Gastroenterology;  Laterality: N/A;  . TONSILLECTOMY AND ADENOIDECTOMY  06/2005  . WISDOM TOOTH EXTRACTION     Family History:  Family History  Problem Relation Age of Onset  . Anesthesia problems Maternal Grandfather   . Heart disease Maternal Grandfather   . Nephrolithiasis Maternal Grandfather   . Diabetes Maternal Grandfather   . Mental illness Maternal Grandfather   . Cholelithiasis Mother   . Nephrolithiasis Mother   .  Depression Mother   . Hypertension Mother   . Miscarriages / [place] Mother   . Anxiety disorder Mother   . Nephrolithiasis Maternal Grandmother   . COPD Maternal Grandmother   . Heart disease Paternal Grandfather   . Cholelithiasis Maternal Aunt    . Depression Maternal Aunt   . Learning disabilities Maternal Aunt   . Bipolar disorder Sister    Family Psychiatric  History: As [er H&P Social History:  Social History   Substance and Sexual Activity  Alcohol Use No     Social History   Substance and Sexual Activity  Drug Use No    Social History   Socioeconomic History  . Marital status: Single    Spouse name: None  . Number of children: None  . Years of education: None  . Highest education level: None  Social Needs  . Financial resource strain: None  . Food insecurity - worry: None  . Food insecurity - inability: None  . Transportation needs - medical: None  . Transportation needs - non-medical: None  Occupational History  . 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 Smoking status: Passive Smoke Exposure - Never Smoker  . Smokeless tobacco: Never Used  Substance and Sexual Activity  . Alcohol use: No  . Drug use: No  . Sexual activity: Not Currently  Other Topics Concern  . None  Social History Narrative   Repeating 9th grade (missed 40 days last year despite homebound instruction)   Additional Social History:      Sleep: improving   Appetite:  Fair  Current Medications: Current Facility-Administered Medications  Medication Dose Route Frequency Provider Last Rate Last Dose  . chlordiazePOXIDE (LIBRIUM) capsule 25 mg  25 mg Oral QID PRN Georgiann Cocker, MD   25 mg at 04/12/17 0910  . dicyclomine (BENTYL) tablet 20 mg  20 mg Oral Q6H PRN Rankin, Shuvon B, NP      . docusate sodium (COLACE) capsule 100 mg  100 mg Oral Daily Izediuno, Delight Ovens, MD   100 mg at 04/13/17 0824  . gabapentin (NEURONTIN) capsule 300 mg  300 mg Oral TID Cobos, Rockey Situ, MD   300 mg at 04/13/17 1204  . hydrOXYzine (ATARAX/VISTARIL) tablet 25 mg  25 mg Oral Q6H PRN Rankin, Shuvon B, NP   25 mg at 04/12/17 2219  . loperamide (IMODIUM) capsule 2-4 mg  2-4 mg Oral PRN Rankin, Shuvon B, NP      .  magnesium hydroxide (MILK OF MAGNESIA) suspension 30 mL  30 mL Oral Daily PRN Oneta Rack, NP   30 mL at 04/13/17 1005  . methocarbamol (ROBAXIN) tablet 500 mg  500 mg Oral Q8H PRN Rankin, Shuvon B, NP      . multivitamin with minerals tablet 1 tablet  1 tablet Oral Daily Rankin, Shuvon B, NP   1 tablet at 04/13/17 0823  . naproxen (NAPROSYN) tablet 500 mg  500 mg Oral BID PRN Rankin, Shuvon B, NP   500 mg at 04/12/17 2111  . ondansetron (ZOFRAN-ODT) disintegrating tablet 4 mg  4 mg Oral Q6H PRN Rankin, Shuvon B, NP      . QUEtiapine (SEROQUEL) tablet 100 mg  100 mg Oral QHS Charm Rings, NP   100 mg at 04/12/17 2219  . QUEtiapine (SEROQUEL) tablet 25 mg  25 mg Oral Daily Georgiann Cocker, MD   25 mg at 04/13/17 1610    Lab Results: No  results found for this or any previous visit (from the past 48 hour(s)).  Blood Alcohol level:  Lab Results  Component Value Date   [facility] <10 04/11/2017   ETH <11 12/09/2012    Metabolic Disorder Labs: Lab Results  Component Value Date   HGBA1C  04/20/2010    5.4 (NOTE)                                                                       According to the ADA Clinical Practice Recommendations for 2011, when HbA1c is used as a screening test:   >=6.5%   Diagnostic of Diabetes Mellitus           (if abnormal result  is confirmed)  5.7-6.4%   Increased risk of developing Diabetes Mellitus  References:Diagnosis and Classification of Diabetes Mellitus,Diabetes Care,2011,34(Suppl 1):S62-S69 and Standards of Medical Care in         Diabetes - 2011,Diabetes Care,2011,34  (Suppl 1):S11-S61.   MPG 108 04/20/2010   Lab Results  Component Value Date   PROLACTIN 9.4 03/16/2012   Lab Results  Component Value Date   CHOL 163 04/11/2017   TRIG 53 04/11/2017   HDL 71 04/11/2017   CHOLHDL 2.3 04/11/2017   VLDL 11 04/11/2017   LDLCALC 81 04/11/2017   LDLCALC 139 (H) 12/10/2012    Physical Findings: AIMS: Facial and Oral Movements Muscles of Facial  Expression: None, normal Lips and Perioral Area: None, normal Jaw: None, normal Tongue: None, normal,Extremity Movements Upper (arms, wrists, hands, fingers): None, normal Lower (legs, knees, ankles, toes): None, normal, Trunk Movements Neck, shoulders, hips: None, normal, Overall Severity Severity of abnormal movements (highest score from questions above): None, normal Incapacitation due to abnormal movements: None, normal Patient's awareness of abnormal movements (rate only patient's report): No Awareness, Dental Status Current problems with teeth and/or dentures?: Yes Does patient usually wear dentures?: Yes  CIWA:  CIWA-Ar Total: 1 COWS:  COWS Total Score: 0  Musculoskeletal: Strength & Muscle Tone: within normal limits Gait & Station: normal Patient leans: N/A  Psychiatric Specialty Exam: Physical Exam  Nursing note and vitals reviewed. Constitutional: She is oriented to person, place, and time. She appears well-developed and well-nourished.  Cardiovascular: Normal rate.  Respiratory: Effort normal.  Musculoskeletal: Normal range of motion.  Neurological: She is alert and oriented to person, place, and time.  Skin: Skin is warm and dry.  Psychiatric: Her behavior is normal. Thought content normal.    Review of Systems  Neurological: Negative for tremors.  Psychiatric/Behavioral: Positive for depression. Suicidal ideas: passive. The patient is nervous/anxious.   All other systems reviewed and are negative.   Blood pressure (!) 127/58, pulse 90, temperature 98 F (36.7 C), resp. rate 16, height 5' 3.5" (1.613 m), weight 65.3 kg (144 lb), last menstrual period 04/02/2017, SpO2 100 %.Body mass index is 25.11 kg/m.  General Appearance: Fairly Groomed  Eye Contact:  Good  Speech:  Normal Rate  Volume:  Normal  Mood:  Euthymic, Pt reports anxious around others   Affect:  Blunted but improving  Thought Process:  Linear  Orientation:  Full (Time, Place, and Person)  Thought  Content:  No AVH, No Delusions.  Suicidal Thoughts:  Passive SI. No HI. Contracts for  safety on the unit.  Homicidal Thoughts:  No  Memory:  Immediate;   Good Recent;   Good Remote;   Good  Judgement:  Fair  Insight:  Fair  Psychomotor Activity:  Normal  Concentration:  Concentration: Good and Attention Span: Good  Recall:  Fair  Fund of Knowledge:  Good  Language:  Good  Akathisia:  Negative  Handed:    AIMS (if indicated):     Assets:  Communication Skills Physical Health Resilience  ADL's:  Intact  Cognition:  WNL  Sleep:  Number of Hours: 6.25, improving      Treatment Plan Summary: Daily contact with patient to assess and evaluate symptoms and progress in treatment and Medication management:  Psychiatric: SIMD SUD   PLAN: Continue Seroquel 100 mg QHS and 50 mg QD for mood stablilzation 1. Adjusted Gabapentin 300 mg PO at bedtime only 2. Continue other medications at current dose 3 Encourage unit groups and therapeutic activities 4. Monitor mood, behavior and interaction with peers 5. Motivational enhancement  CSW to continue working on discharge disposition    Oneta Rackanika N Peretz Thieme, NP 04/13/2017, 2:43 PM

## 2017-04-13 NOTE — [facility] Group Notes (Signed)
LCSW Group Therapy Note   04/13/2017 1:15pm   Type of Therapy and Topic:  Group Therapy:  Overcoming Obstacles   Participation Level:  Minimal   Description of Group:    In this group patients will be encouraged to explore what they see as obstacles to their own wellness and recovery. They will be guided to discuss their thoughts, feelings, and behaviors related to these obstacles. The group will process together ways to cope with barriers, with attention given to specific choices patients can make. Each patient will be challenged to identify changes they are motivated to make in order to overcome their obstacles. This group will be process-oriented, with patients participating in exploration of their own experiences as well as giving and receiving support and challenge from other group members.   Therapeutic Goals: 1. Patient will identify personal and current obstacles as they relate to admission. 2. Patient will identify barriers that currently interfere with their wellness or overcoming obstacles.  3. Patient will identify feelings, thought process and behaviors related to these barriers. 4. Patient will identify two changes they are willing to make to overcome these obstacles:      Summary of Patient Progress   [name] was attentive and engaged during today's processing group. She shared that her biggest obstacle involves affording her outpatient care and medications. [facility] and therapy, but declined residential treatment. [name] stated that her anxiety continues to be high. She continues to show some progress in the group setting with improving insight.    Therapeutic Modalities:   Cognitive Behavioral Therapy Solution Focused Therapy Motivational Interviewing Relapse Prevention Therapy  Ledell PeoplesHeather N Smart, LCSW 04/13/2017 12:23 PM

## 2017-04-13 NOTE — [facility] Group Notes (Signed)
Orientation / Goals   Date:  04/13/2017  Time:  10:19 AM  Type of Therapy:  Nurse Education  The group focuses on teaching patients who their staff is, what their staff's responsibilities are and what the unit programming / scheduling looks like.  Participation Level:  Did Not Attend  Participation Quality:    Affect:    Cognitive:    Insight:    Engagement in Group:    Modes of Intervention:    Summary of Progress/Problems:  [name], [name] 04/13/2017, 10:19 AM

## 2017-04-13 NOTE — Plan of Care (Signed)
  Medication: Compliance with prescribed medication regimen will improve 04/13/2017 2328 - Progressing by Delos HaringPhillips, Janiyla Long A, RN Note Pt taking medications as prescribed

## 2017-04-13 NOTE — [facility] Suicide Risk Assessment (Signed)
[facility] INPATIENT:  Family/Significant Other Suicide Prevention Education  Suicide Prevention Education:  Education Completed; Melinda BertholdHailey Turner, sister [phone]) has been identified by the patient as the family member/significant other with whom the patient will be residing, and identified as the person(s) who will aid the patient in the event of a mental health crisis (suicidal ideations/suicide attempt).  With written consent from the patient, the family member/significant other has been provided the following suicide prevention education, prior to the and/or following the discharge of the patient.  The suicide prevention education provided includes the following:  Suicide risk factors  Suicide prevention and interventions  National Suicide Hotline telephone number  [facility] assessment telephone number  [facility] Assistance 911  [facility] and/or Residential Mobile Crisis Unit telephone number  Request made of family/significant other to:  Remove weapons (e.g., guns, rifles, knives), all items previously/currently identified as safety concern.    Remove drugs/medications (over-the-counter, prescriptions, illicit drugs), all items previously/currently identified as a safety concern.  The family member/significant other verbalizes understanding of the suicide prevention education information provided.  The family member/significant other agrees to remove the items of safety concern listed above.  Melinda SarahJolan E Nicolina Turner 04/13/2017, 3:55 PM

## 2017-04-13 NOTE — Progress Notes (Signed)
D: Pt denies SI/HI/AVH. Pt is pleasant and cooperative. Pt said she felt better cause the meds are helping.   A: Pt was offered support and encouragement. Pt was given scheduled medications. Pt was encourage to attend groups. Q 15 minute checks were done for safety.   R:Pt attends groups and interacts well with peers and staff. Pt is taking medication. Pt has no complaints.Pt receptive to treatment and safety maintained on unit.

## 2017-04-13 NOTE — Progress Notes (Signed)
DAR NOTE: Patient presents with anxious affect and depressed mood. Pt complained of family issues that is causing her stress and anxiety. Denies pain, auditory and visual hallucinations.  Rates depression at 4, hopelessness at 5, and anxiety at 4.  Maintained on routine safety checks.  Medications given as prescribed.  Support and encouragement offered as needed.  Attended group and participated. Patient observed socializing with peers in the dayroom.  Offered no complaint.

## 2017-04-13 NOTE — Tx Team (Signed)
Interdisciplinary Treatment and Diagnostic Plan Update  04/13/2017 Time of Session: 0830AM [name] MRN: [id number]  Principal Diagnosis: Substance induced mood disorder ([place])  Secondary Diagnoses: Principal Problem:   Substance induced mood disorder ([place]) Active Problems:   MDD (major depressive disorder), recurrent severe, without psychosis ([place])   Polysubstance (excluding opioids) dependence ([place])   Current Medications:  Current Facility-Administered Medications  Medication Dose Route Frequency Provider Last Rate Last Dose  . chlordiazePOXIDE (LIBRIUM) capsule 25 mg  25 mg Oral QID PRN Artist Beach, MD   25 mg at 04/12/17 0910  . dicyclomine (BENTYL) tablet 20 mg  20 mg Oral Q6H PRN Rankin, Shuvon B, NP      . docusate sodium (COLACE) capsule 100 mg  100 mg Oral Daily Izediuno, Laruth Bouchard, MD   100 mg at 04/13/17 0824  . gabapentin (NEURONTIN) capsule 300 mg  300 mg Oral TID Cobos, Myer Peer, MD   300 mg at 04/13/17 2952  . hydrOXYzine (ATARAX/VISTARIL) tablet 25 mg  25 mg Oral Q6H PRN Rankin, Shuvon B, NP   25 mg at 04/12/17 2219  . loperamide (IMODIUM) capsule 2-4 mg  2-4 mg Oral PRN Rankin, Shuvon B, NP      . methocarbamol (ROBAXIN) tablet 500 mg  500 mg Oral Q8H PRN Rankin, Shuvon B, NP      . multivitamin with minerals tablet 1 tablet  1 tablet Oral Daily Rankin, Shuvon B, NP   1 tablet at 04/13/17 0823  . naproxen (NAPROSYN) tablet 500 mg  500 mg Oral BID PRN Rankin, Shuvon B, NP   500 mg at 04/12/17 2111  . ondansetron (ZOFRAN-ODT) disintegrating tablet 4 mg  4 mg Oral Q6H PRN Rankin, Shuvon B, NP      . QUEtiapine (SEROQUEL) tablet 100 mg  100 mg Oral QHS Patrecia Pour, NP   100 mg at 04/12/17 2219  . QUEtiapine (SEROQUEL) tablet 25 mg  25 mg Oral Daily Izediuno, Laruth Bouchard, MD   25 mg at 04/13/17 8413   PTA Medications: No medications prior to admission.    Patient Stressors: Educational concerns Marital or family conflict Occupational  concerns Substance abuse  Patient Strengths: Average or above average intelligence Capable of independent living Agricultural engineer for treatment/growth Physical Health Supportive family/friends  Treatment Modalities: Medication Management, Group therapy, Case management,  1 to 1 session with clinician, Psychoeducation, Recreational therapy.   Physician Treatment Plan for Primary Diagnosis: Substance induced mood disorder ([place]) Long Term Goal(s): Improvement in symptoms so as ready for discharge Improvement in symptoms so as ready for discharge   Short Term Goals: Ability to identify changes in lifestyle to reduce recurrence of condition will improve Ability to verbalize feelings will improve Ability to disclose and discuss suicidal ideas Ability to demonstrate self-control will improve Ability to identify and develop effective coping behaviors will improve Ability to maintain clinical measurements within normal limits will improve Compliance with prescribed medications will improve Ability to identify triggers associated with substance abuse/mental health issues will improve Ability to identify changes in lifestyle to reduce recurrence of condition will improve Ability to verbalize feelings will improve Ability to disclose and discuss suicidal ideas Ability to demonstrate self-control will improve Ability to identify and develop effective coping behaviors will improve Ability to maintain clinical measurements within normal limits will improve Compliance with prescribed medications will improve Ability to identify triggers associated with substance abuse/mental health issues will improve  Medication Management: Evaluate patient's response, side effects, and tolerance  of medication regimen.  Therapeutic Interventions: 1 to 1 sessions, Unit Group sessions and Medication administration.  Evaluation of Outcomes: Not Met  Physician Treatment Plan for Secondary  Diagnosis: Principal Problem:   Substance induced mood disorder ([place]) Active Problems:   MDD (major depressive disorder), recurrent severe, without psychosis ([place])   Polysubstance (excluding opioids) dependence ([place])  Long Term Goal(s): Improvement in symptoms so as ready for discharge Improvement in symptoms so as ready for discharge   Short Term Goals: Ability to identify changes in lifestyle to reduce recurrence of condition will improve Ability to verbalize feelings will improve Ability to disclose and discuss suicidal ideas Ability to demonstrate self-control will improve Ability to identify and develop effective coping behaviors will improve Ability to maintain clinical measurements within normal limits will improve Compliance with prescribed medications will improve Ability to identify triggers associated with substance abuse/mental health issues will improve Ability to identify changes in lifestyle to reduce recurrence of condition will improve Ability to verbalize feelings will improve Ability to disclose and discuss suicidal ideas Ability to demonstrate self-control will improve Ability to identify and develop effective coping behaviors will improve Ability to maintain clinical measurements within normal limits will improve Compliance with prescribed medications will improve Ability to identify triggers associated with substance abuse/mental health issues will improve     Medication Management: Evaluate patient's response, side effects, and tolerance of medication regimen.  Therapeutic Interventions: 1 to 1 sessions, Unit Group sessions and Medication administration.  Evaluation of Outcomes: Not Met   RN Treatment Plan for Primary Diagnosis: Substance induced mood disorder ([place]) Long Term Goal(s): Knowledge of disease and therapeutic regimen to maintain health will improve  Short Term Goals: Ability to remain free from injury will improve, Ability to disclose and discuss  suicidal ideas and Ability to identify and develop effective coping behaviors will improve  Medication Management: RN will administer medications as ordered by provider, will assess and evaluate patient's response and provide education to patient for prescribed medication. RN will report any adverse and/or side effects to prescribing provider.  Therapeutic Interventions: 1 on 1 counseling sessions, Psychoeducation, Medication administration, Evaluate responses to treatment, Monitor vital signs and CBGs as ordered, Perform/monitor CIWA, COWS, AIMS and Fall Risk screenings as ordered, Perform wound care treatments as ordered.  Evaluation of Outcomes: Not Met   LCSW Treatment Plan for Primary Diagnosis: Substance induced mood disorder ([place]) Long Term Goal(s): Safe transition to appropriate next level of care at discharge, Engage patient in therapeutic group addressing interpersonal concerns.  Short Term Goals: Engage patient in aftercare planning with referrals and resources, Facilitate patient progression through stages of change regarding substance use diagnoses and concerns and Identify triggers associated with mental health/substance abuse issues  Therapeutic Interventions: Assess for all discharge needs, 1 to 1 time with Social worker, Explore available resources and support systems, Assess for adequacy in community support network, Educate family and significant other(s) on suicide prevention, Complete Psychosocial Assessment, Interpersonal group therapy.  Evaluation of Outcomes: Not Met   Progress in Treatment: Attending groups: Yes. Participating in groups: Yes. Taking medication as prescribed: Yes. Toleration medication: Yes. Family/Significant other contact made: No, will contact:  pt consented to collateral contact with pt's sister Patient understands diagnosis: Yes. Discussing patient identified problems/goals with staff: Yes. Medical problems stabilized or resolved: Yes. Denies  suicidal/homicidal ideation: Yes. Issues/concerns per patient self-inventory: No. Other: n/a   New problem(s) identified: No, Describe:  n/a  New Short Term/Long Term Goal(s): detox, medication management for mood stabilization;  elimination of SI thoughts; development of comprehensive mental wellness/sobriety plan.   Discharge Plan or Barriers: CSW assessing for appropriate referrals. Pt is considering residential but is unsure of what she wants to do from here. [place] pamphlet and AA/NA information also provided to pt for additional community support.   Reason for Continuation of Hospitalization: Depression Medication stabilization Suicidal ideation Withdrawal symptoms  Estimated Length of Stay: Thursday, 04/16/17  Attendees: Patient: [name]  04/13/2017 8:50 AM  Physician: Dr. Sanjuana Letters MD 04/13/2017 8:50 AM  Nursing: Vallery Ridge; Opal Sidles RN 04/13/2017 8:50 AM  RN Care Manager:x 04/13/2017 8:50 AM  Social Worker: Press photographer, LCSW 04/13/2017 8:50 AM  Recreational Therapist: x 04/13/2017 8:50 AM  Other: Lindell Spar NP; Darnelle Maffucci Money NP 04/13/2017 8:50 AM  Other:  04/13/2017 8:50 AM  Other: 04/13/2017 8:50 AM    Scribe for Treatment Team: [place], LCSW 04/13/2017 8:50 AM

## 2017-04-14 MED ORDER — HYDROXYZINE HCL 50 MG PO TABS
50.0000 mg | ORAL_TABLET | Freq: Once | ORAL | Status: AC
  Administered 2017-04-14: 50 mg via ORAL

## 2017-04-14 MED ORDER — HYDROXYZINE HCL 50 MG PO TABS
ORAL_TABLET | ORAL | Status: AC
  Filled 2017-04-14: qty 1

## 2017-04-14 NOTE — Progress Notes (Signed)
Patient ID: [name], female   DOB: June 27, 1995, 22 y.o.   MRN: [id number]  D: Patient crying c/o of anxiety on approach. Pt reports she normally uses drugs when she is feeling this way. Pt encouraged to use different coping skills. Pt mood and affect appeared anxious. Pt denies SI/HI/AVH and pain.  Cooperative with assessment. No acute distressed noted at this time.   A: Support and encouragement provided as needed. Pt able to use deep breathing to decrease anxiety. Medications administered as prescribed. Pt encouraged to discuss feelings and come to staff with any question or concerns.   R: Patient remains safe on the unit. Pt reports decrease anxiety.

## 2017-04-14 NOTE — Plan of Care (Signed)
Pt is taking medications as prescribed and tolerating well. 

## 2017-04-14 NOTE — [facility] Group Notes (Signed)
[facility] Group Therapy 04/14/2017 1:15pm  Type of Therapy: Mental Health Association Presentation  Participation Level: Active  Participation Quality: Attentive  Affect: Appropriate  Cognitive: Oriented  Insight: Developing/Improving  Engagement in Therapy: Engaged  Modes of Intervention: Discussion, Education and Socialization  Summary of Progress/Problems: Mental Health Association (MHA) Speaker came to talk about his personal journey with mental health. The pt processed ways by which to relate to the speaker. MHA speaker provided handouts and educational information pertaining to groups and services offered by the MHA. Pt was engaged in speaker's presentation and was receptive to resources provided.    Darneisha Windhorst N Smart, LCSW 04/14/2017 3:25 PM  

## 2017-04-14 NOTE — Progress Notes (Signed)
Patient attended last evening's A.A.meeting and was appropriate.  

## 2017-04-14 NOTE — Progress Notes (Signed)
Pt did not attend the morning orientation/goals group. 

## 2017-04-14 NOTE — Progress Notes (Addendum)
[facility] MD Progress Note  04/14/2017 2:12 PM [name]  MRN:  [id number]  Subjective: I feel fine.  They have me on Seroquel and Vistaril.  I do not know if the naproxen is working as still having a lot of aches.  And I do not need an antidepressant on discharge.  I declined the gabapentin because it was giving me the high that I had when I was taking those pills.  22 y.o Caucasian female, single, lives with her grandmother, unemployed. Pt has a background history of SUD and mood disorder. She presented ER for increasing anxiety and thoughts of overdosing.  Patient is alert and oriented x4.  Her mood today continues to present as depressed and is congruent, she does note improvement since her admission to the hospital.  She is observed lying in her room during group time.  She responds and acknowledges Clinical research associate appropriately.  And seems to be very engaged with treatment.  She states her goal for today is to prepare for discharge.  And she is inquiring about her prescriptions upon discharge.  She denies any depressive or anxiety symptoms at this time.  Reports decrease withdrawal symptoms with the exception of muscle aches and myalgia.  Review course of expectation with patient in regards to symptoms of withdrawal and long-term effects.  She verbalizes understanding.  Currently working with a Child psychotherapist in regards to placement and will began to prepare for discharge at this time.  She states that she is sleeping sleeping and eating well with no disturbances.  She denies suicidal ideations homicidal ideations auditory and visual hallucinations and does not appear to be responding to internal stimuli at this time.  She is able to contract for safety.  Objective:  No UDS on file.   Principal Problem: Substance induced mood disorder ([facility]) Diagnosis:   Patient Active Problem List   Diagnosis Date Noted  . Polysubstance (excluding opioids) dependence ([facility]) [F19.20] 04/11/2017  . MDD (major depressive  disorder), recurrent severe, without psychosis ([facility]) [F33.2] 04/10/2017  . Post-operative pain [G89.18] 08/26/2014  . Pain, dental [K08.89]   . Cellulitis and abscess of oral soft tissues [K12.2]   . Substance induced mood disorder ([facility]) [F19.94] 03/15/2012  . GAD (generalized anxiety disorder) [F41.1] 03/15/2012  . 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 ADHD (attention deficit hyperactivity disorder), combined type [F90.2] 03/15/2012  . Headache(784.0) [R51] 09/18/2011  . Palpitations [R00.2] 09/18/2011  . Rapid weight loss [R63.4] 09/18/2011  . Nausea & vomiting [R11.2] 09/17/2011  . Epigastric abdominal pain [R10.13] 09/17/2011  . Pilonidal abscess [L05.01] 04/20/2011  . Chronic constipation [K59.09] 04/20/2011  . Allergy history, radiographic dye [Z91.041] 04/20/2011  . Status post laparoscopic cholecystectomy [Z90.49] 03/10/2011   Total Time spent with patient: 30 minutes  Past Psychiatric History: As per H&P  Past Medical History:  Past Medical History:  Diagnosis Date  . ADHD (attention deficit hyperactivity disorder)   . Anesthesia complication    woke up fighting after tonsillectomy  . Anxiety   . Cholecystitis   . Depression   . Dysmenorrhea   . GERD (gastroesophageal reflux disease)   . Hyperlipidemia   . Hypoglycemia   . Obesity   . Syncope   . Viral warts    hand  . Vision abnormalities    wears glasses    Past Surgical History:  Procedure Laterality Date  . CHOLECYSTECTOMY  03/21/2011   Procedure: LAPAROSCOPIC CHOLECYSTECTOMY;  Surgeon: Shelly Rubenstein, MD;  Location: [facility] OR;  Service: General;  Laterality: N/A;  . ESOPHAGOGASTRODUODENOSCOPY  09/26/2011   Procedure: ESOPHAGOGASTRODUODENOSCOPY (EGD);  Surgeon: Jon Gills, MD;  Location: [facility] OR;  Service: Gastroenterology;  Laterality: N/A;  . TONSILLECTOMY AND ADENOIDECTOMY  06/2005  . WISDOM TOOTH EXTRACTION     Family History:  Family History  Problem Relation Age of Onset  . Anesthesia  problems Maternal Grandfather   . Heart disease Maternal Grandfather   . Nephrolithiasis Maternal Grandfather   . Diabetes Maternal Grandfather   . Mental illness Maternal Grandfather   . Cholelithiasis Mother   . Nephrolithiasis Mother   . Depression Mother   . Hypertension Mother   . Miscarriages / [place] Mother   . Anxiety disorder Mother   . Nephrolithiasis Maternal Grandmother   . COPD Maternal Grandmother   . Heart disease Paternal Grandfather   . Cholelithiasis Maternal Aunt   . Depression Maternal Aunt   . Learning disabilities Maternal Aunt   . Bipolar disorder Sister    Family Psychiatric  History: As [er H&P Social History:  Social History   Substance and Sexual Activity  Alcohol Use No     Social History   Substance and Sexual Activity  Drug Use No    Social History   Socioeconomic History  . Marital status: Single    Spouse name: None  . Number of children: None  . Years of education: None  . Highest education level: None  Social Needs  . Financial resource strain: None  . Food insecurity - worry: None  . Food insecurity - inability: None  . Transportation needs - medical: None  . Transportation needs - non-medical: None  Occupational History  . 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 Smoking status: Passive Smoke Exposure - Never Smoker  . Smokeless tobacco: Never Used  Substance and Sexual Activity  . Alcohol use: No  . Drug use: No  . Sexual activity: Not Currently  Other Topics Concern  . None  Social History Narrative   Repeating 9th grade (missed 40 days last year despite homebound instruction)   Additional Social History:      Sleep: improving   Appetite:  Fair  Current Medications: Current Facility-Administered Medications  Medication Dose Route Frequency Provider Last Rate Last Dose  . hydrOXYzine (ATARAX/VISTARIL) 50 MG tablet           . chlordiazePOXIDE (LIBRIUM) capsule 25 mg  25 mg  Oral QID PRN Georgiann Cocker, MD   25 mg at 04/12/17 0910  . dicyclomine (BENTYL) tablet 20 mg  20 mg Oral Q6H PRN Rankin, Shuvon B, NP      . docusate sodium (COLACE) capsule 100 mg  100 mg Oral Daily Izediuno, Vincent A, MD   100 mg at 04/14/17 0809  . gabapentin (NEURONTIN) capsule 300 mg  300 mg Oral QHS Oneta Rack, NP      . hydrOXYzine (ATARAX/VISTARIL) tablet 25 mg  25 mg Oral Q6H PRN Rankin, Shuvon B, NP   25 mg at 04/14/17 0603  . loperamide (IMODIUM) capsule 2-4 mg  2-4 mg Oral PRN Rankin, Shuvon B, NP      . magnesium hydroxide (MILK OF MAGNESIA) suspension 30 mL  30 mL Oral Daily PRN Oneta Rack, NP   30 mL at 04/13/17 1005  . methocarbamol (ROBAXIN) tablet 500 mg  500 mg Oral Q8H PRN Rankin, Shuvon B, NP      . multivitamin with minerals tablet 1 tablet  1 tablet Oral Daily Rankin, Shuvon B, NP   1 tablet at 04/14/17 0810  . naproxen (NAPROSYN) tablet 500 mg  500 mg Oral BID PRN Rankin, Shuvon B, NP   500 mg at 04/14/17 1204  . ondansetron (ZOFRAN-ODT) disintegrating tablet 4 mg  4 mg Oral Q6H PRN Rankin, Shuvon B, NP      . QUEtiapine (SEROQUEL) tablet 100 mg  100 mg Oral QHS Charm Rings, NP   100 mg at 04/13/17 2200  . QUEtiapine (SEROQUEL) tablet 25 mg  25 mg Oral Daily Izediuno, Delight Ovens, MD   25 mg at 04/14/17 0810    Lab Results: No results found for this or any previous visit (from the past 48 hour(s)).  Blood Alcohol level:  Lab Results  Component Value Date   [facility] <10 04/11/2017   ETH <11 12/09/2012    Metabolic Disorder Labs: Lab Results  Component Value Date   HGBA1C  04/20/2010    5.4 (NOTE)                                                                       According to the ADA Clinical Practice Recommendations for 2011, when HbA1c is used as a screening test:   >=6.5%   Diagnostic of Diabetes Mellitus           (if abnormal result  is confirmed)  5.7-6.4%   Increased risk of developing Diabetes Mellitus  References:Diagnosis and  Classification of Diabetes Mellitus,Diabetes Care,2011,34(Suppl 1):S62-S69 and Standards of Medical Care in         Diabetes - 2011,Diabetes Care,2011,34  (Suppl 1):S11-S61.   MPG 108 04/20/2010   Lab Results  Component Value Date   PROLACTIN 9.4 03/16/2012   Lab Results  Component Value Date   CHOL 163 04/11/2017   TRIG 53 04/11/2017   HDL 71 04/11/2017   CHOLHDL 2.3 04/11/2017   VLDL 11 04/11/2017   LDLCALC 81 04/11/2017   LDLCALC 139 (H) 12/10/2012    Physical Findings: AIMS: Facial and Oral Movements Muscles of Facial Expression: None, normal Lips and Perioral Area: None, normal Jaw: None, normal Tongue: None, normal,Extremity Movements Upper (arms, wrists, hands, fingers): None, normal Lower (legs, knees, ankles, toes): None, normal, Trunk Movements Neck, shoulders, hips: None, normal, Overall Severity Severity of abnormal movements (highest score from questions above): None, normal Incapacitation due to abnormal movements: None, normal Patient's awareness of abnormal movements (rate only patient's report): No Awareness, Dental Status Current problems with teeth and/or dentures?: Yes Does patient usually wear dentures?: Yes  CIWA:  CIWA-Ar Total: 4 COWS:  COWS Total Score: 6  Musculoskeletal: Strength & Muscle Tone: within normal limits Gait & Station: normal Patient leans: N/A  Psychiatric Specialty Exam: Physical Exam  Nursing note and vitals reviewed. Constitutional: She is oriented to person, place, and time. She appears well-developed and well-nourished.  Cardiovascular: Normal rate.  Respiratory: Effort normal.  Musculoskeletal: Normal range of motion.  Neurological: She is alert and oriented to person, place, and time.  Skin: Skin is warm and dry.  Psychiatric: Her behavior is normal. Thought content normal.    Review of Systems  Neurological: Negative for tremors.  Psychiatric/Behavioral: Positive for depression. Suicidal ideas: passive. The patient  is nervous/anxious.  All other systems reviewed and are negative.   Blood pressure 112/64, pulse (!) 106, temperature 98.6 F (37 C), resp. rate 16, height 5' 3.5" (1.613 m), weight 65.3 kg (144 lb), last menstrual period 04/02/2017, SpO2 100 %.Body mass index is 25.11 kg/m.  General Appearance: Fairly Groomed  Eye Contact:  Good  Speech:  Normal Rate  Volume:  Normal  Mood:  Depressed, Pt reports anxious around others   Affect:  Congruent and brightens upon approach  Thought Process:  Linear  Orientation:  Full (Time, Place, and Person)  Thought Content:  No AVH, No Delusions.  Suicidal Thoughts:  No  Homicidal Thoughts:  No  Memory:  Immediate;   Good Recent;   Good Remote;   Good  Judgement:  Fair  Insight:  Fair  Psychomotor Activity:  Normal  Concentration:  Concentration: Good and Attention Span: Good  Recall:  Fair  Fund of Knowledge:  Good  Language:  Good  Akathisia:  Negative  Handed:    AIMS (if indicated):     Assets:  Communication Skills Physical Health Resilience  ADL's:  Intact  Cognition:  WNL  Sleep:  Number of Hours: 5.25, improving      Treatment Plan Summary: Daily contact with patient to assess and evaluate symptoms and progress in treatment and Medication management:  Psychiatric: SIMD SUD   PLAN: Continue Seroquel 100 mg QHS and 50 mg QD for mood stablilzation 1. Adjusted Gabapentin 300 mg PO at bedtime only 2. Continue other medications at current dose 3 Encourage unit groups and therapeutic activities 4. Monitor mood, behavior and interaction with peers 5. Motivational enhancement  CSW to continue working on discharge disposition    Truman Haywardakia S Starkes, FNP 04/14/2017, 2:12 PM   Agree with NP Progress Note

## 2017-04-14 NOTE — Progress Notes (Signed)
[facility] Group Notes:  (Nursing/MHT/Case Management/Adjunct)  Date:  04/14/2017  Time:  5:50 PM  Type of Therapy:  Nurse Education  Participation Level:  Active  Participation Quality:  Appropriate  Affect:  Appropriate  Cognitive:  Alert and Oriented  Insight:  Appropriate  Engagement in Group:  Engaged  Modes of Intervention:  Activity, Education and Support  Summary of Progress/Problems:The purpose of this group was to educate patients on the benefits and uses of aromatherapy.  Beatrix ShipperWright, Ia Leeb Martin 04/14/2017, 5:50 PM

## 2017-04-14 NOTE — Progress Notes (Signed)
D:Pt has c/o multiple detox symptoms including anxiety and generalized pain. She says that she has decreased sleep and is craving xanax.  A:Support and encouragement given. Medications given as ordered and 15 minute checks for safety.  R:Pt verbally denies si and hi. Safety maintained on the unit.

## 2017-04-15 MED ORDER — NAPROXEN 500 MG PO TABS: 500.0000 mg | ORAL_TABLET | Freq: Two times a day (BID) | ORAL | Status: DC | PRN

## 2017-04-15 MED ORDER — HYDROXYZINE HCL 25 MG PO TABS
25.0000 mg | ORAL_TABLET | Freq: Four times a day (QID) | ORAL | Status: DC | PRN
  Administered 2017-04-16: 25 mg via ORAL
  Filled 2017-04-15: qty 10
  Filled 2017-04-15: qty 1

## 2017-04-15 MED ORDER — ONDANSETRON 4 MG PO TBDP: 4.0000 mg | ORAL_TABLET | Freq: Four times a day (QID) | ORAL | Status: DC | PRN

## 2017-04-15 NOTE — Progress Notes (Addendum)
[facility] MD Progress Note  04/15/2017 12:37 PM [name]  MRN:  [id number]  Subjective: At this point I am ready to go.  The groups are no longer able to help me you guys have done all that you can and I am content with the medications that I am on.  My goal today is to go home.  The meds that I received last night the Seroquel Vistaril and Neurontin help me to get the best sleep of had in a really long time.  My discharge planning has already been complete.  And I am just waiting for the doctor to let me go. Objective:  22 y.o Caucasian female, single, lives with her grandmother, unemployed. Pt has a background history of SUD and mood disorder. She presented ER for increasing anxiety and thoughts of overdosing.  Patient is alert and oriented x4.   During evaluation patient is again observed laying in bed and not in group.  She does not appear to be vested in treatment.  However she reports going to group to assess if they will help her or not and if she chooses she will leave the group and return back to her room.  She notes an ongoing history of being a recluse even when she is not in the hospital.  She reports improvement in her muscle aches and that the medications are helping she denies having any support system does despite her sister who is 20 years old.  She reports some on ongoing long-term goals to include going back to school to get her GED.  Was discharged from the hospital she plans to keep herself busy and take her medication so that she can continue to stay healthy in her journey to recovery.  She reports improved sleep, due to the medications received in addition to aromatherapy that was trialed last night.  She has established aftercare at [facility] for next week for medication management, and mental health [facility] for orientation next week as well. She denies suicidal ideations homicidal ideations auditory and visual hallucinations and does not appear to be responding to  internal stimuli at this time.  She is able to contract for safety.    Principal Problem: Substance induced mood disorder (HCC) Diagnosis:   Patient Active Problem List   Diagnosis Date Noted  . Polysubstance (excluding opioids) dependence (HCC) [F19.20] 04/11/2017  . MDD (major depressive disorder), recurrent severe, without psychosis (HCC) [F33.2] 04/10/2017  . Post-operative pain [G89.18] 08/26/2014  . Pain, dental [K08.89]   . Cellulitis and abscess of oral soft tissues [K12.2]   . Substance induced mood disorder (HCC) [F19.94] 03/15/2012  . GAD (generalized anxiety disorder) [F41.1] 03/15/2012  . 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 ADHD (attention deficit hyperactivity disorder), combined type [F90.2] 03/15/2012  . Headache(784.0) [R51] 09/18/2011  . Palpitations [R00.2] 09/18/2011  . Rapid weight loss [R63.4] 09/18/2011  . Nausea & vomiting [R11.2] 09/17/2011  . Epigastric abdominal pain [R10.13] 09/17/2011  . Pilonidal abscess [L05.01] 04/20/2011  . Chronic constipation [K59.09] 04/20/2011  . Allergy history, radiographic dye [Z91.041] 04/20/2011  . Status post laparoscopic cholecystectomy [Z90.49] 03/10/2011   Total Time spent with patient: 20 minutes  Past Psychiatric History: As per H&P  Past Medical History:  Past Medical History:  Diagnosis Date  . ADHD (attention deficit hyperactivity disorder)   . Anesthesia complication    woke up fighting after tonsillectomy  . Anxiety   . Cholecystitis   . Depression   . Dysmenorrhea   .  GERD (gastroesophageal reflux disease)   . Hyperlipidemia   . Hypoglycemia   . Obesity   . Syncope   . Viral warts    hand  . Vision abnormalities    wears glasses    Past Surgical History:  Procedure Laterality Date  . CHOLECYSTECTOMY  03/21/2011   Procedure: LAPAROSCOPIC CHOLECYSTECTOMY;  Surgeon: Shelly Rubenstein, MD;  Location: [facility] OR;  Service: General;  Laterality: N/A;  . ESOPHAGOGASTRODUODENOSCOPY  09/26/2011    Procedure: ESOPHAGOGASTRODUODENOSCOPY (EGD);  Surgeon: Jon Gills, MD;  Location: [facility] OR;  Service: Gastroenterology;  Laterality: N/A;  . TONSILLECTOMY AND ADENOIDECTOMY  06/2005  . WISDOM TOOTH EXTRACTION     Family History:  Family History  Problem Relation Age of Onset  . Anesthesia problems Maternal Grandfather   . Heart disease Maternal Grandfather   . Nephrolithiasis Maternal Grandfather   . Diabetes Maternal Grandfather   . Mental illness Maternal Grandfather   . Cholelithiasis Mother   . Nephrolithiasis Mother   . Depression Mother   . Hypertension Mother   . Miscarriages / [place] Mother   . Anxiety disorder Mother   . Nephrolithiasis Maternal Grandmother   . COPD Maternal Grandmother   . Heart disease Paternal Grandfather   . Cholelithiasis Maternal Aunt   . Depression Maternal Aunt   . Learning disabilities Maternal Aunt   . Bipolar disorder Sister    Family Psychiatric  History: As [er H&P Social History:  Social History   Substance and Sexual Activity  Alcohol Use No     Social History   Substance and Sexual Activity  Drug Use No    Social History   Socioeconomic History  . Marital status: Single    Spouse name: None  . Number of children: None  . Years of education: None  . Highest education level: None  Social Needs  . Financial resource strain: None  . Food insecurity - worry: None  . Food insecurity - inability: None  . Transportation needs - medical: None  . Transportation needs - non-medical: None  Occupational History  . 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 Smoking status: Passive Smoke Exposure - Never Smoker  . Smokeless tobacco: Never Used  Substance and Sexual Activity  . Alcohol use: No  . Drug use: No  . Sexual activity: Not Currently  Other Topics Concern  . None  Social History Narrative   Repeating 9th grade (missed 40 days last year despite homebound instruction)   Additional  Social History:      Sleep: improving   Appetite:  Fair  Current Medications: Current Facility-Administered Medications  Medication Dose Route Frequency Provider Last Rate Last Dose  . chlordiazePOXIDE (LIBRIUM) capsule 25 mg  25 mg Oral QID PRN Georgiann Cocker, MD   25 mg at 04/12/17 0910  . dicyclomine (BENTYL) tablet 20 mg  20 mg Oral Q6H PRN Rankin, Shuvon B, NP      . docusate sodium (COLACE) capsule 100 mg  100 mg Oral Daily Izediuno, Delight Ovens, MD   100 mg at 04/15/17 0806  . gabapentin (NEURONTIN) capsule 300 mg  300 mg Oral QHS Oneta Rack, NP   300 mg at 04/14/17 2107  . hydrOXYzine (ATARAX/VISTARIL) tablet 25 mg  25 mg Oral Q6H PRN Rankin, Shuvon B, NP   25 mg at 04/15/17 1144  . loperamide (IMODIUM) capsule 2-4 mg  2-4 mg Oral PRN Rankin, Shuvon B, NP      .  magnesium hydroxide (MILK OF MAGNESIA) suspension 30 mL  30 mL Oral Daily PRN Oneta RackLewis, Tanika N, NP   30 mL at 04/13/17 1005  . methocarbamol (ROBAXIN) tablet 500 mg  500 mg Oral Q8H PRN Rankin, Shuvon B, NP   500 mg at 04/14/17 2318  . multivitamin with minerals tablet 1 tablet  1 tablet Oral Daily Rankin, Shuvon B, NP   1 tablet at 04/15/17 0806  . naproxen (NAPROSYN) tablet 500 mg  500 mg Oral BID PRN Rankin, Shuvon B, NP   500 mg at 04/14/17 1204  . ondansetron (ZOFRAN-ODT) disintegrating tablet 4 mg  4 mg Oral Q6H PRN Rankin, Shuvon B, NP      . QUEtiapine (SEROQUEL) tablet 100 mg  100 mg Oral QHS Charm RingsLord, Jamison Y, NP   100 mg at 04/14/17 2107  . QUEtiapine (SEROQUEL) tablet 25 mg  25 mg Oral Daily Izediuno, Delight OvensVincent A, MD   25 mg at 04/15/17 [id number]    Lab Results: No results found for this or any previous visit (from the past 48 hour(s)).  Blood Alcohol level:  Lab Results  Component Value Date   [facility] Facility[facility] <10 04/11/2017   ETH <11 12/09/2012    Metabolic Disorder Labs: Lab Results  Component Value Date   HGBA1C  04/20/2010    5.4 (NOTE)                                                                        According to the ADA Clinical Practice Recommendations for 2011, when HbA1c is used as a screening test:   >=6.5%   Diagnostic of Diabetes Mellitus           (if abnormal result  is confirmed)  5.7-6.4%   Increased risk of developing Diabetes Mellitus  References:Diagnosis and Classification of Diabetes Mellitus,Diabetes Care,2011,34(Suppl 1):S62-S69 and Standards of Medical Care in         Diabetes - 2011,Diabetes Care,2011,34  (Suppl 1):S11-S61.   MPG 108 04/20/2010   Lab Results  Component Value Date   PROLACTIN 9.4 03/16/2012   Lab Results  Component Value Date   CHOL 163 04/11/2017   TRIG 53 04/11/2017   HDL 71 04/11/2017   CHOLHDL 2.3 04/11/2017   VLDL 11 04/11/2017   LDLCALC 81 04/11/2017   LDLCALC 139 (H) 12/10/2012    Physical Findings: AIMS: Facial and Oral Movements Muscles of Facial Expression: None, normal Lips and Perioral Area: None, normal Jaw: None, normal Tongue: None, normal,Extremity Movements Upper (arms, wrists, hands, fingers): None, normal Lower (legs, knees, ankles, toes): None, normal, Trunk Movements Neck, shoulders, hips: None, normal, Overall Severity Severity of abnormal movements (highest score from questions above): None, normal Incapacitation due to abnormal movements: None, normal Patient's awareness of abnormal movements (rate only patient's report): No Awareness, Dental Status Current problems with teeth and/or dentures?: Yes Does patient usually wear dentures?: Yes  CIWA:  CIWA-Ar Total: 3 COWS:  COWS Total Score: 4  Musculoskeletal: Strength & Muscle Tone: within normal limits Gait & Station: normal Patient leans: N/A  Psychiatric Specialty Exam: Physical Exam  Nursing note and vitals reviewed. Constitutional: She is oriented to person, place, and time. She appears well-developed and well-nourished.  Cardiovascular: Normal rate.  Respiratory: Effort normal.  Musculoskeletal: Normal range of motion.  Neurological: She is alert and  oriented to person, place, and time.  Skin: Skin is warm and dry.  Psychiatric: Her behavior is normal. Thought content normal.    Review of Systems  Neurological: Negative for tremors.  Psychiatric/Behavioral: Positive for depression. Suicidal ideas: passive. The patient is nervous/anxious.   All other systems reviewed and are negative.   Blood pressure (!) 107/56, pulse 98, temperature 98 F (36.7 C), temperature source Oral, resp. rate 16, height 5' 3.5" (1.613 m), weight 65.3 kg (144 lb), last menstrual period 04/02/2017, SpO2 100 %.Body mass index is 25.11 kg/m.  General Appearance: Fairly Groomed  Eye Contact:  Good  Speech:  Normal Rate  Volume:  Normal  Mood:  better, Pt reports anxious around others   Affect:  Congruent and brightens upon approach  Thought Process:  Linear  Orientation:  Full (Time, Place, and Person)  Thought Content:  No AVH, No Delusions.  Suicidal Thoughts:  No  Homicidal Thoughts:  No  Memory:  Immediate;   Good Recent;   Good Remote;   Good  Judgement:  Fair  Insight:  Fair  Psychomotor Activity:  Normal  Concentration:  Concentration: Good and Attention Span: Good  Recall:  Fair  Fund of Knowledge:  Good  Language:  Good  Akathisia:  Negative  Handed:    AIMS (if indicated):     Assets:  Communication Skills Physical Health Resilience  ADL's:  Intact  Cognition:  WNL  Sleep:  Number of Hours: 6.25, improving      Treatment Plan Summary: Daily contact with patient to assess and evaluate symptoms and progress in treatment and Medication management:  Psychiatric: SIMD SUD   PLAN: Continue Seroquel 100 mg QHS and 50 mg QD for mood stablilzation 1. Adjusted Gabapentin 300 mg PO at bedtime only 2. Continue other medications at current dose 3 Encourage unit groups and therapeutic activities 4. Monitor mood, behavior and interaction with peers 5. Motivational enhancement  CSW to continue working on discharge disposition.   Anticipated discharge date tomorrow April 16, 2017.  Aftercare has been scheduled at this time.   Truman Hayward, FNP 04/15/2017, 12:37 PM   Agree with NP Progress Note

## 2017-04-15 NOTE — Progress Notes (Signed)
D: Pt denies SI/HI/AVH. Pt is pleasant and cooperative. Pt focused on going home, pt seen in dayroom putting puzzle together  A: Pt was offered support and encouragement. Pt was given scheduled medications. Pt was encourage to attend groups. Q 15 minute checks were done for safety.  R:Pt attends groups and interacts well with peers and staff. Pt is taking medication. Pt has no complaints.Pt receptive to treatment and safety maintained on unit.

## 2017-04-15 NOTE — [facility] Group Notes (Signed)
LCSW Group Therapy Note 04/15/2017 3:19 PM  Type of Therapy and Topic: Group Therapy: Avoiding Self-Sabotaging and Enabling Behaviors  Participation Level: Active  Description of Group:  In this group, patients will learn how to identify obstacles, self-sabotaging and enabling behaviors, as well as: what are they, why do we do them and what needs these behaviors meet. Discuss unhealthy relationships and how to have positive healthy boundaries with those that sabotage and enable. Explore aspects of self-sabotage and enabling in yourself and how to limit these self-destructive behaviors in everyday life.  Therapeutic Goals: 1. Patient will identify one obstacle that relates to self-sabotage and enabling behaviors 2. Patient will identify one personal self-sabotaging or enabling behavior they did prior to admission 3. Patient will state a plan to change the above identified behavior 4. Patient will demonstrate ability to communicate their needs through discussion and/or role play.   Summary of Patient Progress:  [name] was engaged and participated throughout the group session. She contributed and states that self sabotaging behaviors were "doing the same shit and expecting something good to come out of it". 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 She reports that she "will go without before she asks for help". [name] reports that when she discharges from the hospital, [facility] in her remaining clean and working on herself.    Therapeutic Modalities:  Cognitive Behavioral Therapy Person-Centered Therapy Motivational Interviewing   Baldo DaubJolan Arneisha Kincannon LCSWA Clinical Social Worker

## 2017-04-15 NOTE — Progress Notes (Signed)
D: Pt continues to be very flat and depressed on the unit today. Pt also continues to be very isolative. Pt reported this morning that she was ready for discharge, pt became irritable after she was told she is not being discharged. Complained of increased anxiety. Pt reported that her depression was a 0, his hopelessness was a 0, and that his anxiety was a 5. Pt reported being negative SI/HI, no AH/VH noted. A: 15 min checks continued for patient safety. R: Pts safety maintained.

## 2017-04-15 NOTE — Progress Notes (Signed)
Recreation Therapy Notes  Date: 3.20.19 Time: 9:30 a.m. Location: [address] Dayroom  Group Topic: Stress Management  Goal Area(s) Addresses:  Goal 1.1: To reduce stress  -Patient will report feeling a reduction in stress level  -Patient will identify the importance of stress management  -Patient will participate during stress management group treatment    Intervention: Stress Management  Activity: Guided Imagery- Patients were in a peaceful environment with soft lighting enhancing patients mood. Patients were read a guided imagery script to promote relaxation   Education: Stress Management, Discharge Planning.   Education Outcome: Acknowledges edcuation/In group clarification offered/Needs additional education  Clinical Observations/Feedback:: Patient did not attend   Sophia Sperry, Recreation Therapy Intern   [name] 04/15/2017 10:58 AM 

## 2017-04-15 NOTE — Progress Notes (Signed)
  [facility] :  Will you be returning to the same living situation after discharge:  Yes,  patient is returning home with her older sister.  At discharge, do you have transportation home?: Yes,  patient reports that her sister will pick her up at discharge.  Do you have the ability to pay for your medications: No.  Release of information consent forms completed and in the chart;  Patient's signature needed at discharge.  Patient to Follow up at: Follow-up Information    [facility] Follow up on 04/20/2017.   Specialty:  Behavioral Health Why:  Appointment is Monday, 04/20/17 at 8:00am.Please be sure to bring your hospital discharge paperwork, Photo ID and any insurance information you may have(if you have any).  Contact information: [address] [place] [address] [phone]           Next level of care provider [facility] Link:yes  Safety Planning and Suicide Prevention discussed: Yes,  with the patient's older sister, Patsey BertholdHailey Ellena  Has patient been referred to the Quitline?: Patient refused referral  Patient has been referred for addiction treatment: Pt. refused referral  Maeola SarahJolan E Nikala Walsworth, LCSWA 04/15/2017, 9:35 AM

## 2017-04-15 NOTE — [facility] Group Notes (Signed)
[facility] Group Notes:  (Nursing/MHT/Case Management/Adjunct)  Date:  04/15/2017  Time:  6:33 PM Type of Therapy:  Psychoeducational Skills  Participation Level:  Active  Participation Quality:  Appropriate  Affect:  Appropriate  Cognitive:  Appropriate  Insight:  Appropriate  Engagement in Group:  Engaged  Modes of Intervention:  Problem-solving  Summary of Progress/Problems: Pt talked about their coping skills and their experience here in the hospital.    Bethann PunchesJane O Maikel Neisler 04/15/2017, 6:33 PM

## 2017-04-15 NOTE — Plan of Care (Signed)
  Coping: Ability to demonstrate self-control will improve 04/15/2017 2216 - Progressing by Delos HaringPhillips, Dushaun Okey A, RN Note Pt stated she was anxious, but was in dayroom doing puzzle with peers

## 2017-04-16 DIAGNOSIS — Z810 Family history of intellectual disabilities: Secondary | ICD-10-CM

## 2017-04-16 MED ORDER — QUETIAPINE FUMARATE 100 MG PO TABS: 100.0000 mg | ORAL_TABLET | Freq: Every day | ORAL | 0 refills | Status: DC

## 2017-04-16 MED ORDER — HYDROXYZINE HCL 25 MG PO TABS: 25.0000 mg | ORAL_TABLET | Freq: Four times a day (QID) | ORAL | 0 refills | Status: DC | PRN

## 2017-04-16 MED ORDER — GABAPENTIN 300 MG PO CAPS: 300.0000 mg | ORAL_CAPSULE | Freq: Every day | ORAL | 0 refills | Status: DC

## 2017-04-16 MED ORDER — QUETIAPINE FUMARATE 25 MG PO TABS: 25.0000 mg | ORAL_TABLET | Freq: Every day | ORAL | 0 refills | Status: DC

## 2017-04-16 NOTE — Progress Notes (Signed)
Pt came to morning orientation/goals group and stated that she will be preparing for discharge today.

## 2017-04-16 NOTE — Progress Notes (Signed)
Patient discharged to lobby. Patient was stable and appreciative at that time. All papers, samples and prescriptions were given and valuables returned. Verbal understanding expressed. Denies SI/HI and A/VH. Pat given opportunity to express concerns and ask questions.

## 2017-04-16 NOTE — Progress Notes (Signed)
  [facility] :  Will you be returning to the same living situation after discharge:  Yes,  patient is returning home with her older sister, [name] At discharge, do you have transportation home?: Yes,  the patient reports her sister will pick her up at discharge Do you have the ability to pay for your medications: No.  Release of information consent forms completed and in the chart;  Patient's signature needed at discharge.  Patient to Follow up at: Follow-up Information    [facility] Follow up on 04/20/2017.   Specialty:  Behavioral Health Why:  Appointment is Monday, 04/20/17 at 8:00am.Please be sure to bring your hospital discharge paperwork, Photo ID and any insurance information you may have(if you have any).  Contact information: [address] [place] [address] [phone]           Next [facility] of care provider [facility]:yes  Safety Planning and Suicide Prevention discussed: Yes,  with the patient's older sister, Melinda BertholdHailey Turner     Has patient been referred to the Quitline?: Patient refused referral  Patient has been referred for addiction treatment: Pt. refused referral  Maeola SarahJolan E Treston Coker, LCSWA 04/16/2017, 10:20 AM

## 2017-04-16 NOTE — [facility] Suicide Risk Assessment (Signed)
[facility] Discharge Suicide Risk Assessment   Principal Problem: Substance induced mood disorder [facility]) Discharge Diagnoses:  Patient Active Problem List   Diagnosis Date Noted  . Polysubstance (excluding opioids) dependence (HCC) [F19.20] 04/11/2017  . MDD (major depressive disorder), recurrent severe, without psychosis (HCC) [F33.2] 04/10/2017  . Post-operative pain [G89.18] 08/26/2014  . Pain, dental [K08.89]   . Cellulitis and abscess of oral soft tissues [K12.2]   . Substance induced mood disorder (HCC) [F19.94] 03/15/2012  . GAD (generalized anxiety disorder) [F41.1] 03/15/2012  . 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 ADHD (attention deficit hyperactivity disorder), combined type [F90.2] 03/15/2012  . Headache(784.0) [R51] 09/18/2011  . Palpitations [R00.2] 09/18/2011  . Rapid weight loss [R63.4] 09/18/2011  . Nausea & vomiting [R11.2] 09/17/2011  . Epigastric abdominal pain [R10.13] 09/17/2011  . Pilonidal abscess [L05.01] 04/20/2011  . Chronic constipation [K59.09] 04/20/2011  . Allergy history, radiographic dye [Z91.041] 04/20/2011  . Status post laparoscopic cholecystectomy [Z90.49] 03/10/2011    Total Time spent with patient: 30 minutes  Musculoskeletal: Strength & Muscle Tone: within normal limits Gait & Station: normal Patient leans: N/A  Psychiatric Specialty Exam: ROS denies headache, no repetitive yawning, no rhinorrhea or lacrimation, no chest pain, no shortness of breath, no vomiting, no diarrhea , no cramping or myalgias  Blood pressure 114/64, pulse 94, temperature 98 F (36.7 C), temperature source Oral, resp. rate 16, height 5' 3.5" (1.613 m), weight 65.3 kg (144 lb), last menstrual period 04/02/2017, SpO2 100 %.Body mass index is 25.11 kg/m.  General Appearance: Well Groomed  Eye Contact::  Good  Speech:  Normal Rate409  Volume:  Normal  Mood:  reports mood is improved and denies feeling depressed at this time, characterizes mood as 8/10  Affect:   Appropriate  Thought Process:  Linear and Descriptions of Associations: Intact  Orientation:  Full (Time, Place, and Person)  Thought Content:  no hallucinations, no delusions, not internally preoccupied   Suicidal Thoughts:  No denies any suicidal or self injurious ideations, denies any homicidal or violent ideations  Homicidal Thoughts:  No  Memory:  recent and remote grossly intact   Judgement:  Other:  improving   Insight:  improving   Psychomotor Activity:  Normal  Concentration:  Good  Recall:  Good  Fund of Knowledge:Good  Language: Good  Akathisia:  Negative  Handed:  Right  AIMS (if indicated):     Assets:  Communication Skills Desire for Improvement Resilience  Sleep:  Number of Hours: 6.75  Cognition: WNL  ADL's:  Intact   Mental Status Per Nursing Assessment::   On Admission:     Demographic Factors:  22 year old single female,unemployed, homeless, states she was staying at friends before admission, but states she is now planning on going to stay with her sister.   Loss Factors: Substance abuse, unemployment, housing   Historical Factors: History of polysubstance use disorder , history of depression. History of suicide attempt as a teenager   Risk Reduction Factors:   Living with another person, especially a relative and Positive coping skills or problem solving skills  Continued Clinical Symptoms:  At this time patient reports she is feeling "a lot better than I did", and reports improving mood, and presents with a more reactive affect. No thought disorder, no suicidal or self injurious ideations, no homicidal or violent ideations, future oriented . Behavior on unit in good control. Denies medication side effects. Side effects discussed , including risk of weight gain, metabolic disturbances, motor side effects ,  and sedation.   Cognitive Features That Contribute To Risk:  No gross cognitive deficits noted upon discharge. Is alert , attentive, and oriented x  3   Suicide Risk:  Mild:  Suicidal ideation of limited frequency, intensity, duration, and specificity.  There are no identifiable plans, no associated intent, mild dysphoria and related symptoms, good self-control (both objective and subjective assessment), few other risk factors, and identifiable protective factors, including available and accessible social support.  Follow-up Information    [facility] Follow up on 04/20/2017.   Specialty:  Behavioral Health Why:  Appointment is Monday, 04/20/17 at 8:00am.Please be sure to bring your hospital discharge paperwork, Photo ID and any insurance information you may have(if you have any).  Contact information: [address] [place] [address] [phone]           Plan Of Care/Follow-up recommendations:  Activity:  as tolerated  Diet:  Regular Tests:  NA Other:  See below  Patient is discharging in good spirits, expresses readiness for discharge, states her sister will be picking her up later today. Plans to go live with sister, states this is a sober and supportive environment. Plans to follow up as above. We discussed importance of avoiding people, places and situations she associates with drug use in order to decrease risk of relapse .   Craige CottaFernando A Cobos, MD 04/16/2017, 12:57 PM
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
## 2017-04-27 ENCOUNTER — Encounter: Payer: Self-pay | Admitting: Obstetrics & Gynecology

## 2017-05-07 ENCOUNTER — Emergency Department (HOSPITAL_COMMUNITY)
Admission: EM | Admit: 2017-05-07 | Discharge: 2017-05-07 | Discharge status: Against Medical Advice | Payer: Medicaid Other

## 2017-05-07 NOTE — ED Notes (Signed)
Last call. No answer.  

## 2017-05-09 ENCOUNTER — Encounter (HOSPITAL_COMMUNITY): Payer: Self-pay | Admitting: Emergency Medicine

## 2017-05-09 ENCOUNTER — Other Ambulatory Visit: Payer: Self-pay

## 2017-05-09 ENCOUNTER — Emergency Department (HOSPITAL_COMMUNITY)
Admission: EM | Admit: 2017-05-09 | Discharge: 2017-05-09 | Disposition: A | Discharge status: Home / Self Care | Payer: Medicaid Other | Attending: Physician Assistant | Admitting: Physician Assistant

## 2017-05-09 DIAGNOSIS — E785 Hyperlipidemia, unspecified: Secondary | ICD-10-CM | POA: Diagnosis not present

## 2017-05-09 DIAGNOSIS — Z7722 Contact with and (suspected) exposure to environmental tobacco smoke (acute) (chronic): Secondary | ICD-10-CM | POA: Diagnosis not present

## 2017-05-09 DIAGNOSIS — Z79899 Other long term (current) drug therapy: Secondary | ICD-10-CM | POA: Diagnosis not present

## 2017-05-09 DIAGNOSIS — R21 Rash and other nonspecific skin eruption: Secondary | ICD-10-CM | POA: Diagnosis not present

## 2017-05-09 DIAGNOSIS — J029 Acute pharyngitis, unspecified: Secondary | ICD-10-CM | POA: Diagnosis present

## 2017-05-09 DIAGNOSIS — H6690 Otitis media, unspecified, unspecified ear: Secondary | ICD-10-CM

## 2017-05-09 DIAGNOSIS — H65192 Other acute nonsuppurative otitis media, left ear: Secondary | ICD-10-CM | POA: Diagnosis not present

## 2017-05-09 LAB — GROUP A STREP BY PCR: Group A Strep by PCR: NOT DETECTED

## 2017-05-09 MED ORDER — AMOXICILLIN 500 MG PO CAPS
500.0000 mg | ORAL_CAPSULE | Freq: Once | ORAL | Status: AC
  Administered 2017-05-09: 500 mg via ORAL
  Filled 2017-05-09: qty 1

## 2017-05-09 MED ORDER — AMOXICILLIN 500 MG PO CAPS: 500.0000 mg | ORAL_CAPSULE | Freq: Two times a day (BID) | ORAL | 0 refills | Status: AC

## 2017-05-09 MED ORDER — LIDOCAINE VISCOUS 2 % MT SOLN: 15.0000 mL | OROMUCOSAL | 0 refills | Status: DC | PRN

## 2017-05-09 MED ORDER — TRIAMCINOLONE ACETONIDE 0.1 % EX CREA: 1.0000 "application " | TOPICAL_CREAM | Freq: Two times a day (BID) | CUTANEOUS | 0 refills | Status: DC

## 2017-05-09 MED ORDER — ACETAMINOPHEN 325 MG PO TABS
650.0000 mg | ORAL_TABLET | Freq: Once | ORAL | Status: AC
  Administered 2017-05-09: 650 mg via ORAL
  Filled 2017-05-09: qty 2

## 2017-05-09 NOTE — ED Triage Notes (Signed)
C/o ear ache and sore throat x 4 days
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
## 2018-01-26 ENCOUNTER — Encounter (HOSPITAL_COMMUNITY): Payer: Self-pay | Admitting: *Deleted

## 2018-01-26 ENCOUNTER — Emergency Department (HOSPITAL_COMMUNITY): Payer: Medicaid Other

## 2018-01-26 ENCOUNTER — Emergency Department (HOSPITAL_COMMUNITY)
Admission: EM | Admit: 2018-01-26 | Discharge: 2018-01-26 | Disposition: A | Discharge status: Home / Self Care | Payer: Medicaid Other | Attending: Emergency Medicine | Admitting: Emergency Medicine

## 2018-01-26 DIAGNOSIS — B9789 Other viral agents as the cause of diseases classified elsewhere: Secondary | ICD-10-CM

## 2018-01-26 DIAGNOSIS — R05 Cough: Secondary | ICD-10-CM | POA: Diagnosis present

## 2018-01-26 DIAGNOSIS — J069 Acute upper respiratory infection, unspecified: Secondary | ICD-10-CM

## 2018-01-26 MED ORDER — ALBUTEROL SULFATE HFA 108 (90 BASE) MCG/ACT IN AERS
2.0000 | INHALATION_SPRAY | Freq: Four times a day (QID) | RESPIRATORY_TRACT | Status: DC | PRN
  Administered 2018-01-26: 2 via RESPIRATORY_TRACT
  Filled 2018-01-26: qty 6.7

## 2018-01-26 NOTE — ED Triage Notes (Signed)
Pt in c/o cough and congestion for the last month, no distress noted

## 2018-01-26 NOTE — ED Provider Notes (Signed)
[facility]   CSN: [id number] Arrival date & time: 01/26/18  1203     History   Chief Complaint Chief Complaint  Patient presents with  . Cough    HPI [name] is a 22 y.o. female.  Patient is a [age] year old female who presents with cough and cold symptoms.  She states she had an episode starting December 1 for a couple weeks but that resolved and then about a week ago started having recurrence of symptoms including runny nose nasal congestion and coughing.  She has some mild myalgias.  No shortness of breath.  She has some nausea but no vomiting or diarrhea.  No known fevers.  Her cough is mostly nonproductive.  She notices a little wheezing at times.  She has a smoker.  She has been using over-the-counter medicines with some improvement in symptoms.     Past Medical History:  Diagnosis Date  . ADHD (attention deficit hyperactivity disorder)   . Anesthesia complication    woke up fighting after tonsillectomy  . Anxiety   . Cholecystitis   . Depression   . Dysmenorrhea   . GERD (gastroesophageal reflux disease)   . Hyperlipidemia   . Hypoglycemia   . Obesity   . Syncope   . Viral warts    hand  . Vision abnormalities    wears glasses    Patient Active Problem List   Diagnosis Date Noted  . Polysubstance (excluding opioids) dependence (HCC) 04/11/2017  . MDD (major depressive disorder), recurrent severe, 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  . Post-operative pain 08/26/2014  . Pain, dental   . Cellulitis and abscess of oral soft tissues   . Substance induced mood disorder (HCC) 03/15/2012  . GAD (generalized anxiety disorder) 03/15/2012  . 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 ADHD (attention deficit hyperactivity disorder), combined type 03/15/2012  . Headache(784.0) 09/18/2011  . Palpitations 09/18/2011  . Rapid weight loss 09/18/2011  . Nausea & vomiting 09/17/2011  . Epigastric abdominal pain  09/17/2011  . Pilonidal abscess 04/20/2011  . Chronic constipation 04/20/2011  . Allergy history, radiographic dye 04/20/2011  . Status post laparoscopic cholecystectomy 03/10/2011    Past Surgical History:  Procedure Laterality Date  . CHOLECYSTECTOMY  03/21/2011   Procedure: LAPAROSCOPIC CHOLECYSTECTOMY;  Surgeon: Shelly Rubensteinouglas A Blackman, MD;  Location: [facility] OR;  Service: General;  Laterality: N/A;  . ESOPHAGOGASTRODUODENOSCOPY  09/26/2011   Procedure: ESOPHAGOGASTRODUODENOSCOPY (EGD);  Surgeon: Jon GillsJoseph H Clark, MD;  [facility];  Service: Gastroenterology;  Laterality: N/A;  . TONSILLECTOMY AND ADENOIDECTOMY  06/2005  . WISDOM TOOTH EXTRACTION       OB History    Gravida  0   Para      Term      Preterm      AB      Living        SAB      TAB      Ectopic      Multiple      Live Births               Home Medications    Prior to Admission medications   Medication Sig Start Date End Date Taking? Authorizing Provider  gabapentin (NEURONTIN) 300 MG capsule Take 1 capsule (300 mg total) by mouth at bedtime. 04/16/17   Money, Gerlene Burdockravis B, FNP  hydrOXYzine (ATARAX/VISTARIL) 25 MG tablet Take 1 tablet (25 mg total) by mouth every 6 (six) hours as needed for anxiety. 04/16/17  Money, Gerlene Burdockravis B, FNP  lidocaine (XYLOCAINE) 2 % solution Use as directed 15 mLs in the mouth or throat as needed for mouth pain. 05/09/17   Khatri, Hina, PA-C  QUEtiapine (SEROQUEL) 100 MG tablet Take 1 tablet (100 mg total) by mouth at bedtime. For mood control 04/16/17   Money, Gerlene Burdockravis B, FNP  QUEtiapine (SEROQUEL) 25 MG tablet Take 1 tablet (25 mg total) by mouth daily. For mood control 04/16/17   Money, Gerlene Burdockravis B, FNP  triamcinolone cream (KENALOG) 0.1 % Apply 1 application topically 2 (two) times daily. 05/09/17   Dietrich PatesKhatri, Hina, PA-C    Family History Family History  Problem Relation Age of Onset  . Anesthesia problems Maternal Grandfather   . Heart disease Maternal Grandfather   . Nephrolithiasis  Maternal Grandfather   . Diabetes Maternal Grandfather   . Mental illness Maternal Grandfather   . Cholelithiasis Mother   . Nephrolithiasis Mother   . Depression Mother   . Hypertension Mother   . Miscarriages / [place]   . Anxiety disorder Mother   . Nephrolithiasis Maternal Grandmother   . COPD Maternal Grandmother   . Heart disease Paternal Grandfather   . Cholelithiasis Maternal Aunt   . Depression Maternal Aunt   . Learning disabilities Maternal Aunt   . Bipolar disorder Sister     Social History Social History   Tobacco Use  . Smoking status: Passive Smoke Exposure - Never Smoker  . Smokeless tobacco: Never Used  Substance Use Topics  . Alcohol use: No  . Drug use: No     Allergies   Blueberry fruit extract; Contrast media [iodinated diagnostic agents]; Codeine; and Omnipaque [iohexol]   Review of Systems Review of Systems  Constitutional: Positive for fatigue. Negative for chills, diaphoresis and fever.  HENT: Positive for congestion, postnasal drip and rhinorrhea. Negative for sneezing.   Eyes: Negative.   Respiratory: Positive for cough and wheezing. Negative for chest tightness and shortness of breath.   Cardiovascular: Negative for chest pain and leg swelling.  Gastrointestinal: Positive for nausea. Negative for abdominal pain, blood in stool, diarrhea and vomiting.  Genitourinary: Negative for difficulty urinating, flank pain, frequency and hematuria.  Musculoskeletal: Positive for myalgias. Negative for arthralgias and back pain.  Skin: Negative for rash.  Neurological: Negative for dizziness, speech difficulty, weakness, numbness and headaches.     Physical Exam Updated Vital Signs BP 103/62 (BP Location: Right Arm)   Pulse 69   Temp 98.4 F (36.9 C) (Oral)   Resp 18   SpO2 99%   Physical Exam Constitutional:      Appearance: She is well-developed.  HENT:     Head: Normocephalic and atraumatic.     Right Ear: Tympanic membrane  normal.     Left Ear: Tympanic membrane normal.     Mouth/Throat:     Mouth: Mucous membranes are moist.     Pharynx: No oropharyngeal exudate or posterior oropharyngeal erythema.  Eyes:     Pupils: Pupils are equal, round, and reactive to light.  Neck:     Musculoskeletal: Normal range of motion and neck supple.  Cardiovascular:     Rate and Rhythm: Normal rate and regular rhythm.     Heart sounds: Normal heart sounds.  Pulmonary:     Effort: Pulmonary effort is normal. No respiratory distress.     Breath sounds: Normal breath sounds. No wheezing or rales.  Chest:     Chest wall: No tenderness.  Abdominal:     General:  Bowel sounds are normal.     Palpations: Abdomen is soft.     Tenderness: There is no abdominal tenderness. There is no guarding or rebound.  Musculoskeletal: Normal range of motion.  Lymphadenopathy:     Cervical: No cervical adenopathy.  Skin:    General: Skin is warm and dry.     Findings: No rash.  Neurological:     Mental Status: She is alert and oriented to person, place, and time.      ED Treatments / Results  Labs (all labs ordered are listed, but only abnormal results are displayed) Labs Reviewed - No data to display  EKG None  Radiology Dg Chest 2 View  Result Date: 01/26/2018 CLINICAL DATA:  Chest pain. EXAM: CHEST - 2 VIEW COMPARISON:  Radiographs of January 19, 2015. FINDINGS: The heart size and mediastinal contours are within normal limits. Both lungs are clear. No pneumothorax or pleural effusion is noted. The visualized skeletal structures are unremarkable. IMPRESSION: No active cardiopulmonary disease. Electronically Signed   By: Lupita Raider, M.D.   On: 01/26/2018 12:19    Procedures Procedures (including critical care time)  Medications Ordered in ED Medications  albuterol (PROVENTIL HFA;VENTOLIN HFA) 108 (90 Base) MCG/ACT inhaler 2 puff (has no administration in time range)     Initial Impression / Assessment and Plan /  ED Course  I have reviewed the triage vital signs and the nursing notes.  Pertinent labs & imaging results that were available during my care of the patient were reviewed by me and considered in my medical decision making (see chart for details).     Patient is a 22 year old female with URI symptoms.  Her lungs are clear without wheezing.  She has no hypoxia.  Her chest x-ray is clear without evidence of pneumonia.  This is likely viral in nature.  She was discharged home in good condition.  She was advised in symptomatic care.  She was dispensed an inhaler given her reports of wheezing and history of smoking.  Return precautions were given.  Final Clinical Impressions(s) / ED Diagnoses   Final diagnoses:  Viral URI with cough    ED Discharge Orders    None       Rolan Bucco, MD 01/26/18 1406

## 2018-01-27 ENCOUNTER — Emergency Department (HOSPITAL_COMMUNITY)
Admission: EM | Admit: 2018-01-27 | Discharge: 2018-01-27 | Disposition: A | Discharge status: Home / Self Care | Payer: Medicaid Other | Attending: Emergency Medicine | Admitting: Emergency Medicine

## 2018-01-27 ENCOUNTER — Encounter (HOSPITAL_COMMUNITY): Payer: Self-pay

## 2018-01-27 ENCOUNTER — Other Ambulatory Visit: Payer: Self-pay

## 2018-01-27 DIAGNOSIS — Z79899 Other long term (current) drug therapy: Secondary | ICD-10-CM | POA: Insufficient documentation

## 2018-01-27 DIAGNOSIS — F909 Attention-deficit hyperactivity disorder, unspecified type: Secondary | ICD-10-CM | POA: Insufficient documentation

## 2018-01-27 DIAGNOSIS — Z7722 Contact with and (suspected) exposure to environmental tobacco smoke (acute) (chronic): Secondary | ICD-10-CM | POA: Insufficient documentation

## 2018-01-27 DIAGNOSIS — J069 Acute upper respiratory infection, unspecified: Secondary | ICD-10-CM | POA: Insufficient documentation

## 2018-01-27 LAB — URINALYSIS, ROUTINE W REFLEX MICROSCOPIC
BILIRUBIN URINE: NEGATIVE
Glucose, UA: NEGATIVE mg/dL
Hgb urine dipstick: NEGATIVE
KETONES UR: NEGATIVE mg/dL
Nitrite: NEGATIVE
PH: 6 (ref 5.0–8.0)
Protein, ur: NEGATIVE mg/dL
Specific Gravity, Urine: 1.024 (ref 1.005–1.030)

## 2018-01-27 LAB — POC URINE PREG, ED: Preg Test, Ur: NEGATIVE

## 2018-01-27 NOTE — ED Triage Notes (Signed)
Pt reports cough, congestion, chest tightness, and nausea x1 month. She [facility]. She also states that "her kidneys hurt." Denies dysuria.

## 2018-01-27 NOTE — ED Notes (Signed)
Pt left prior to receiving discharge instructions.

## 2018-01-27 NOTE — ED Notes (Signed)
ED Provider at bedside. 

## 2018-01-27 NOTE — Discharge Instructions (Addendum)
Please continue using your albuterol as needed Please stop smoking. Recheck with your primary doctor early next week Return if you have worsening symptoms especially high fever, shortness of breath, or unable to keep down liquids.

## 2018-01-27 NOTE — ED Provider Notes (Signed)
[facility]-EMERGENCY DEPT Provider Note   CSN: [id number] Arrival date & time: 01/27/18  2023     History   Chief Complaint Chief Complaint  Patient presents with  . Flank Pain  . Generalized Body Aches    HPI [name] is a 23 y.o. female.  HPI  23 year old female presents today complaining of cough and congestion.  She states that she has had some cough since early December.  She felt worse over the past 2 days.  She was seen and evaluated yesterday and a chest x-Han Vejar.  She states that she was given albuterol inhaler.  She called her primary care doctor [facility] today with questionable antibiotics.  Patient states that that made her think she need to be on antibiotics and return to the ED due to this.  She states she has had ongoing cough and congestion generally does not feel well but has had only low-grade fever.  She states that she had some pain on both of her sides of her chest today with coughing.  She denies any vomiting but has felt nauseated.  She has had some nasal congestion but denies sore throat or ear pain.  She is a smoker and states she has decreased her smoking since she has been sick.  She states her last menstrual.  Was approximately a month ago.  She does not think that she is pregnant and had a negative pregnancy test yesterday as well as a clear chest x-Kinnley Paulson.  Past Medical History:  Diagnosis Date  . ADHD (attention deficit hyperactivity disorder)   . Anesthesia complication    woke up fighting after tonsillectomy  . Anxiety   . Cholecystitis   . Depression   . Dysmenorrhea   . GERD (gastroesophageal reflux disease)   . Hyperlipidemia   . Hypoglycemia   . Obesity   . Syncope   . Viral warts    hand  . Vision abnormalities    wears glasses    Patient Active Problem List   Diagnosis Date Noted  . Polysubstance (excluding opioids) dependence (HCC) 04/11/2017  . MDD (major depressive disorder), recurrent severe, without psychosis  (HCC) 04/10/2017  . Post-operative pain 08/26/2014  . Pain, dental   . Cellulitis and abscess of oral soft tissues   . Substance induced mood disorder (HCC) 03/15/2012  . GAD (generalized anxiety disorder) 03/15/2012  . 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 ADHD (attention deficit hyperactivity disorder), combined type 03/15/2012  . Headache(784.0) 09/18/2011  . Palpitations 09/18/2011  . Rapid weight loss 09/18/2011  . Nausea & vomiting 09/17/2011  . Epigastric abdominal pain 09/17/2011  . Pilonidal abscess 04/20/2011  . Chronic constipation 04/20/2011  . Allergy history, radiographic dye 04/20/2011  . Status post laparoscopic cholecystectomy 03/10/2011    Past Surgical History:  Procedure Laterality Date  . CHOLECYSTECTOMY  03/21/2011   Procedure: LAPAROSCOPIC CHOLECYSTECTOMY;  Surgeon: Shelly Rubenstein, MD;  Location: [facility] OR;  Service: General;  Laterality: N/A;  . ESOPHAGOGASTRODUODENOSCOPY  09/26/2011   Procedure: ESOPHAGOGASTRODUODENOSCOPY (EGD);  Surgeon: Jon Gills, MD;  Location: [facility] OR;  Service: Gastroenterology;  Laterality: N/A;  . TONSILLECTOMY AND ADENOIDECTOMY  06/2005  . WISDOM TOOTH EXTRACTION       OB History    Gravida  0   Para      Term      Preterm      AB      Living        SAB  TAB      Ectopic      Multiple      Live Births               Home Medications    Prior to Admission medications   Medication Sig Start Date End Date Taking? Authorizing Provider  gabapentin (NEURONTIN) 300 MG capsule Take 1 capsule (300 mg total) by mouth at bedtime. 04/16/17   Money, Gerlene Burdock, FNP  hydrOXYzine (ATARAX/VISTARIL) 25 MG tablet Take 1 tablet (25 mg total) by mouth every 6 (six) hours as needed for anxiety. 04/16/17   Money, Gerlene Burdock, FNP  lidocaine (XYLOCAINE) 2 % solution Use as directed 15 mLs in the mouth or throat as needed for mouth pain. 05/09/17   Khatri, Hina, PA-C  QUEtiapine (SEROQUEL) 100 MG tablet Take 1 tablet (100 mg total)  by mouth at bedtime. For mood control 04/16/17   Money, Gerlene Burdock, FNP  QUEtiapine (SEROQUEL) 25 MG tablet Take 1 tablet (25 mg total) by mouth daily. For mood control 04/16/17   Money, Gerlene Burdock, FNP  triamcinolone cream (KENALOG) 0.1 % Apply 1 application topically 2 (two) times daily. 05/09/17   Dietrich Pates, PA-C    Family History Family History  Problem Relation Age of Onset  . Anesthesia problems Maternal Grandfather   . Heart disease Maternal Grandfather   . Nephrolithiasis Maternal Grandfather   . Diabetes Maternal Grandfather   . Mental illness Maternal Grandfather   . Cholelithiasis Mother   . Nephrolithiasis Mother   . Depression Mother   . Hypertension Mother   . Miscarriages / [place] Mother   . Anxiety disorder Mother   . Nephrolithiasis Maternal Grandmother   . COPD Maternal Grandmother   . Heart disease Paternal Grandfather   . Cholelithiasis Maternal Aunt   . Depression Maternal Aunt   . Learning disabilities Maternal Aunt   . Bipolar disorder Sister     Social History Social History   Tobacco Use  . Smoking status: Passive Smoke Exposure - Never Smoker  . Smokeless tobacco: Never Used  Substance Use Topics  . Alcohol use: No  . Drug use: No     Allergies   Blueberry fruit extract; Contrast media [iodinated diagnostic agents]; Codeine; and Omnipaque [iohexol]   Review of Systems Review of Systems  All other systems reviewed and are negative.    Physical Exam Updated Vital Signs BP 107/73 (BP Location: Left Arm)   Pulse 71   Temp 98.3 F (36.8 C) (Oral)   Resp 15   Ht 1.6 m (5\' 3" )   Wt 68 kg   SpO2 100%   BMI 26.57 kg/m   Physical Exam Vitals signs and nursing note reviewed.  Constitutional:      Appearance: Normal appearance.  HENT:     Head: Normocephalic.     Right Ear: Tympanic membrane and external ear normal.     Left Ear: Tympanic membrane and external ear normal.     Nose: Nose normal.     Mouth/Throat:     Mouth: Mucous  membranes are moist.     Pharynx: Oropharynx is clear.  Eyes:     Pupils: Pupils are equal, round, and reactive to light.  Neck:     Musculoskeletal: Normal range of motion.  Cardiovascular:     Rate and Rhythm: Normal rate and regular rhythm.     Pulses: Normal pulses.  Pulmonary:     Effort: Pulmonary effort is normal.     Breath  sounds: Normal breath sounds.     Comments: Few rhonchi at right base with no wheezes Abdominal:     General: Abdomen is flat.     Palpations: Abdomen is soft.  Musculoskeletal: Normal range of motion.  Skin:    General: Skin is warm and dry.     Capillary Refill: Capillary refill takes less than 2 seconds.  Neurological:     General: No focal deficit present.     Mental Status: She is alert and oriented to person, place, and time.  Psychiatric:        Mood and Affect: Mood normal.        Behavior: Behavior normal.      ED Treatments / Results  Labs (all labs ordered are listed, but only abnormal results are displayed) Labs Reviewed  URINALYSIS, ROUTINE W REFLEX MICROSCOPIC - Abnormal; Notable for the following components:      Result Value   APPearance HAZY (*)    Leukocytes, UA TRACE (*)    Bacteria, UA RARE (*)    All other components within normal limits  POC URINE PREG, ED    EKG None  Radiology Dg Chest 2 View  Result Date: 01/26/2018 CLINICAL DATA:  Chest pain. EXAM: CHEST - 2 VIEW COMPARISON:  Radiographs of January 19, 2015. FINDINGS: The heart size and mediastinal contours are within normal limits. Both lungs are clear. No pneumothorax or pleural effusion is noted. The visualized skeletal structures are unremarkable. IMPRESSION: No active cardiopulmonary disease. Electronically Signed   By: Lupita RaiderJames  Green Jr, M.D.   On: 01/26/2018 12:19    Procedures Procedures (including critical care time)  Medications Ordered in ED Medications - No data to display   Initial Impression / Assessment and Plan / ED Course  I have  reviewed the triage vital signs and the nursing notes.  Pertinent labs & imaging results that were available during my care of the patient were reviewed by me and considered in my medical decision making (see chart for details).     Reviewed imaging from yesterday.  I reviewed the patient's other studies and her vital signs.  Do not feel any antibiotics are indicated at this time.  She was counseled regarding smoking cessation.  We discussed return precautions and need for follow-up with her primary care doctor and she voices understanding.  Final Clinical Impressions(s) / ED Diagnoses   Final diagnoses:  Upper respiratory tract infection, unspecified type    ED Discharge Orders    None       Margarita Grizzleay, Heinrich Fertig, MD 01/27/18 2320

## 2018-03-26 ENCOUNTER — Emergency Department (HOSPITAL_COMMUNITY)
Admission: EM | Admit: 2018-03-26 | Discharge: 2018-03-26 | Disposition: A | Discharge status: Home / Self Care | Payer: Medicaid Other | Attending: Emergency Medicine | Admitting: Emergency Medicine

## 2018-03-26 ENCOUNTER — Encounter (HOSPITAL_COMMUNITY): Payer: Self-pay

## 2018-03-26 ENCOUNTER — Other Ambulatory Visit: Payer: Self-pay

## 2018-03-26 DIAGNOSIS — R102 Pelvic and perineal pain: Secondary | ICD-10-CM

## 2018-03-26 DIAGNOSIS — F909 Attention-deficit hyperactivity disorder, unspecified type: Secondary | ICD-10-CM | POA: Insufficient documentation

## 2018-03-26 DIAGNOSIS — Z9049 Acquired absence of other specified parts of digestive tract: Secondary | ICD-10-CM | POA: Insufficient documentation

## 2018-03-26 DIAGNOSIS — Z7722 Contact with and (suspected) exposure to environmental tobacco smoke (acute) (chronic): Secondary | ICD-10-CM | POA: Insufficient documentation

## 2018-03-26 DIAGNOSIS — F329 Major depressive disorder, single episode, unspecified: Secondary | ICD-10-CM | POA: Insufficient documentation

## 2018-03-26 DIAGNOSIS — F419 Anxiety disorder, unspecified: Secondary | ICD-10-CM | POA: Insufficient documentation

## 2018-03-26 DIAGNOSIS — Z79899 Other long term (current) drug therapy: Secondary | ICD-10-CM | POA: Insufficient documentation

## 2018-03-26 LAB — URINALYSIS, ROUTINE W REFLEX MICROSCOPIC
BILIRUBIN URINE: NEGATIVE
Glucose, UA: NEGATIVE mg/dL
Ketones, ur: 5 mg/dL — AB
NITRITE: NEGATIVE
PROTEIN: NEGATIVE mg/dL
RBC / HPF: >50 RBC/hpf — ABNORMAL HIGH (ref 0–5)
Specific Gravity, Urine: 1.023 (ref 1.005–1.030)
pH: 8 (ref 5.0–8.0)

## 2018-03-26 LAB — I-STAT BETA HCG BLOOD, ED (MC, WL, AP ONLY)

## 2018-03-26 MED ORDER — FENTANYL CITRATE (PF) 100 MCG/2ML IJ SOLN
100.0000 ug | Freq: Once | INTRAMUSCULAR | Status: AC
  Administered 2018-03-26: 100 ug via INTRAVENOUS
  Filled 2018-03-26: qty 2

## 2018-03-26 MED ORDER — KETOROLAC TROMETHAMINE 30 MG/ML IJ SOLN
30.0000 mg | Freq: Once | INTRAMUSCULAR | Status: AC
Start: 2018-03-26 — End: 2018-03-26
  Administered 2018-03-26: 30 mg via INTRAVENOUS
  Filled 2018-03-26: qty 1

## 2018-03-26 NOTE — ED Notes (Signed)
MD Wickline just left patient room. Patient updated that she is to be discharged and patient very unhappy that she will not be getting more pain medication. Patient pulling out cords and lines at this time.

## 2018-03-26 NOTE — ED Notes (Signed)
Discharge instructions reviewed with patient. Patient verbalizes understanding. VSS. Patient ambulated from ER without assistance. Patient states she is being picked up by an uber.

## 2018-03-26 NOTE — ED Notes (Signed)
Bed: OV56 Expected date:  Expected time:  Means of arrival:  Comments: Abdominal pain/menstrual

## 2018-03-26 NOTE — ED Notes (Signed)
Patient called out every 3 minutes for 12 minutes during shift change. Secretary explained to patient the nurse would be in shortly. When this nurse went into patient room and introduced self to patient, patient cussing at this RN stating she is "still in pain and Im getting pissed off. I've been calling for 15 minutes and no one came in here and Im gonna need yall to do your fucking jobs and get me out of pain!" This RN explained to patient she got pain medications recently and that the physician would be following up with her to decide on a plan of care. Patient immediately responds "Well, then Im gonna need to see my fucking doctor right now!" This nurse explained to the patient that the doctor knows she wants to talk to him and will be in shortly. Patient then threatened this RN stating, "I dont know why yall cant do your fucking jobs! I need to see my doctor NOW. Im about to go the fuck off on you and youre not gonna like it when I do, so get my fucking doctor in here!" Patient mumbling foul language as this RN left the room to get the MD.

## 2018-03-26 NOTE — ED Provider Notes (Signed)
[facility]-EMERGENCY DEPT Provider Note   CSN: [id number] Arrival date & time: 03/26/18  0534    History   Chief Complaint Chief Complaint  Patient presents with  . Endometriosis    HPI [name] is a 23 y.o. female.     The history is provided by the patient.  Abdominal Cramping  This is a new problem. The current episode started 1 to 2 hours ago. The problem occurs constantly. The problem has been rapidly worsening. Associated symptoms include abdominal pain. Nothing aggravates the symptoms. Nothing relieves the symptoms. She has tried nothing for the symptoms.   PT With history of ADHD, depression, dysmenorrhea resents with abdominal cramping that started about 2 hours ago.  She reports she just started her menstrual cycle, and began having severe abdominal cramping.  She reports that she may have endometriosis that was diagnosed by her previous OB/GYN, but she has not had formal testing.  She reports every month she gets this type of pain.  This pain is similar to prior episodes. Past Medical History:  Diagnosis Date  . ADHD (attention deficit hyperactivity disorder)   . Anesthesia complication    woke up fighting after tonsillectomy  . Anxiety   . Cholecystitis   . Depression   . Dysmenorrhea   . GERD (gastroesophageal reflux disease)   . Hyperlipidemia   . Hypoglycemia   . Obesity   . Syncope   . Viral warts    hand  . Vision abnormalities    wears glasses    Patient Active Problem List   Diagnosis Date Noted  . Polysubstance (excluding opioids) dependence (HCC) 04/11/2017  . MDD (major depressive disorder), recurrent severe, 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  . Post-operative pain 08/26/2014  . Pain, dental   . Cellulitis and abscess of oral soft tissues   . Substance induced mood disorder (HCC) 03/15/2012  . GAD (generalized anxiety disorder) 03/15/2012  . 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 ADHD (attention deficit  hyperactivity disorder), combined type 03/15/2012  . Headache(784.0) 09/18/2011  . Palpitations 09/18/2011  . Rapid weight loss 09/18/2011  . Nausea & vomiting 09/17/2011  . Epigastric abdominal pain 09/17/2011  . Pilonidal abscess 04/20/2011  . Chronic constipation 04/20/2011  . Allergy history, radiographic dye 04/20/2011  . Status post laparoscopic cholecystectomy 03/10/2011    Past Surgical History:  Procedure Laterality Date  . CHOLECYSTECTOMY  03/21/2011   Procedure: LAPAROSCOPIC CHOLECYSTECTOMY;  Surgeon: Shelly Rubenstein, MD;  Location: [facility] OR;  Service: General;  Laterality: N/A;  . ESOPHAGOGASTRODUODENOSCOPY  09/26/2011   Procedure: ESOPHAGOGASTRODUODENOSCOPY (EGD);  Surgeon: Jon Gills, MD;  Location: [facility] OR;  Service: Gastroenterology;  Laterality: N/A;  . TONSILLECTOMY AND ADENOIDECTOMY  06/2005  . WISDOM TOOTH EXTRACTION       OB History    Gravida  0   Para      Term      Preterm      AB      Living        SAB      TAB      Ectopic      Multiple      Live Births               Home Medications    Prior to Admission medications   Medication Sig Start Date End Date Taking? Authorizing Provider  gabapentin (NEURONTIN) 300 MG capsule Take 1 capsule (300 mg total) by mouth at bedtime. 04/16/17   Money, Gerlene Burdock,  FNP  hydrOXYzine (ATARAX/VISTARIL) 25 MG tablet Take 1 tablet (25 mg total) by mouth every 6 (six) hours as needed for anxiety. 04/16/17   Money, Gerlene Burdock, FNP  lidocaine (XYLOCAINE) 2 % solution Use as directed 15 mLs in the mouth or throat as needed for mouth pain. 05/09/17   Khatri, Hina, PA-C  QUEtiapine (SEROQUEL) 100 MG tablet Take 1 tablet (100 mg total) by mouth at bedtime. For mood control 04/16/17   Money, Gerlene Burdock, FNP  QUEtiapine (SEROQUEL) 25 MG tablet Take 1 tablet (25 mg total) by mouth daily. For mood control 04/16/17   Money, Gerlene Burdock, FNP  triamcinolone cream (KENALOG) 0.1 % Apply 1 application topically 2 (two) times daily.  05/09/17   Dietrich Pates, PA-C    Family History Family History  Problem Relation Age of Onset  . Anesthesia problems Maternal Grandfather   . Heart disease Maternal Grandfather   . Nephrolithiasis Maternal Grandfather   . Diabetes Maternal Grandfather   . Mental illness Maternal Grandfather   . Cholelithiasis Mother   . Nephrolithiasis Mother   . Depression Mother   . Hypertension Mother   . Miscarriages / [place] Mother   . Anxiety disorder Mother   . Nephrolithiasis Maternal Grandmother   . COPD Maternal Grandmother   . Heart disease Paternal Grandfather   . Cholelithiasis Maternal Aunt   . Depression Maternal Aunt   . Learning disabilities Maternal Aunt   . Bipolar disorder Sister     Social History Social History   Tobacco Use  . Smoking status: Passive Smoke Exposure - Never Smoker  . Smokeless tobacco: Never Used  Substance Use Topics  . Alcohol use: No  . Drug use: No     Allergies   Blueberry fruit extract; Contrast media [iodinated diagnostic agents]; Codeine; and Omnipaque [iohexol]   Review of Systems Review of Systems  Constitutional: Negative for fever.  Gastrointestinal: Positive for abdominal pain.  Genitourinary: Positive for menstrual problem.  Psychiatric/Behavioral: The patient is nervous/anxious.   All other systems reviewed and are negative.    Physical Exam Updated Vital Signs BP 101/78 (BP Location: Left Arm)   Pulse 90   Resp 20   SpO2 98%   Physical Exam CONSTITUTIONAL: Anxious and crying, hyperventilating.  She is sitting up on bed on all fours refusing to lay on her back HEAD: Normocephalic/atraumatic EYES: EOMI, tearful ENMT: Mucous membranes moist NECK: supple no meningeal signs CV: S1/S2 noted, no murmurs/rubs/gallops noted LUNGS: Lungs are clear to auscultation bilaterally, no apparent distress ABDOMEN: soft, mild diffuse tenderness, no rebound or guarding, bowel sounds noted throughout abdomen NEURO: Pt is  awake/alert/appropriate, moves all extremitiesx4.  No facial droop.   EXTREMITIES: full ROM SKIN: warm, color normal PSYCH: Anxious and crying  ED Treatments / Results  Labs (all labs ordered are listed, but only abnormal results are displayed) Labs Reviewed  URINALYSIS, ROUTINE W REFLEX MICROSCOPIC  I-STAT BETA HCG BLOOD, ED ([facility], [facility], [facility] ONLY)    EKG None  Radiology No results found.  Procedures Procedures    Medications Ordered in ED Medications  ketorolac (TORADOL) 30 MG/ML injection 30 mg (30 mg Intravenous Given 03/26/18 0639)  fentaNYL (SUBLIMAZE) injection 100 mcg (100 mcg Intravenous Given 03/26/18 6147)     Initial Impression / Assessment and Plan / ED Course  I have reviewed the triage vital signs and the nursing notes.  Pertinent labs results that were available during my care of the patient were reviewed by me and considered in  my medical decision making (see chart for details).        6:58 AM Patient reports pelvic pain "on the daily "she reports it gets worse each month with her menstrual cycle.  Tonight's episode is similar to prior episodes.  She reports she has having her typical vaginal bleeding with her period.  She also reports a mild dysuria. Her abdominal exam is unremarkable except for mild lower abdominal tenderness on repeat exam.  No indication for emergent imaging or pelvic exam as patient reports she gets this pain chronically.  Suspicion for acute abdominal/gynecologic emergency is low. She reports she is not currently sexually active She has not had recent follow-up with gynecology, but has been advised by gynecology that she may have endometriosis  Pt will be referred to gyn clinic Advised continued use of OTC ibuprofen  Final Clinical Impressions(s) / ED Diagnoses   Final diagnoses:  Pelvic pain    ED Discharge Orders    None       Zadie Rhine, MD 03/26/18 [phone]

## 2018-03-26 NOTE — ED Provider Notes (Signed)
Patient informed of her results.  No convincing signs of UTI. She reports she gets this pain every day.  Advised that this is a chronic issue she should follow-up with gynecology today.  Advised to continue OTC ibuprofen She then asked me what medications were given and then she  states "that shit don't work " then she states she does not want to see any more nurses in the room.  Will discharge home    Zadie Rhine, MD 03/26/18 [phone]

## 2018-03-26 NOTE — ED Triage Notes (Signed)
Per EMS, patient coming from home with complaints of abdominal pain related to her menstrual cycle/endometriosis for the past hour. Patient tried taking tylenol and using heat packs with no relief.

## 2018-03-30 ENCOUNTER — Emergency Department (HOSPITAL_COMMUNITY)
Admission: EM | Admit: 2018-03-30 | Discharge: 2018-03-31 | Disposition: A | Discharge status: Home / Self Care | Payer: Medicaid Other | Attending: Emergency Medicine | Admitting: Emergency Medicine

## 2018-03-30 ENCOUNTER — Other Ambulatory Visit: Payer: Self-pay

## 2018-03-30 ENCOUNTER — Encounter (HOSPITAL_COMMUNITY): Payer: Self-pay

## 2018-03-30 DIAGNOSIS — Z5321 Procedure and treatment not carried out due to patient leaving prior to being seen by health care provider: Secondary | ICD-10-CM | POA: Insufficient documentation

## 2018-03-30 DIAGNOSIS — R1110 Vomiting, unspecified: Secondary | ICD-10-CM | POA: Insufficient documentation

## 2018-03-30 DIAGNOSIS — R1030 Lower abdominal pain, unspecified: Secondary | ICD-10-CM | POA: Insufficient documentation

## 2018-03-30 LAB — URINALYSIS, ROUTINE W REFLEX MICROSCOPIC
BACTERIA UA: NONE SEEN
Bilirubin Urine: NEGATIVE
Glucose, UA: NEGATIVE mg/dL
KETONES UR: 80 mg/dL — AB
Nitrite: NEGATIVE
PH: 6 (ref 5.0–8.0)
PROTEIN: NEGATIVE mg/dL
Specific Gravity, Urine: 1.026 (ref 1.005–1.030)

## 2018-03-30 LAB — CBC
HEMATOCRIT: 40 % (ref 36.0–46.0)
HEMOGLOBIN: 12.8 g/dL (ref 12.0–15.0)
MCH: 26.2 pg (ref 26.0–34.0)
MCHC: 32 g/dL (ref 30.0–36.0)
MCV: 82 fL (ref 80.0–100.0)
NRBC: 0 % (ref 0.0–0.2)
Platelets: 201 10*3/uL (ref 150–400)
RBC: 4.88 MIL/uL (ref 3.87–5.11)
RDW: 13 % (ref 11.5–15.5)
WBC: 3.7 10*3/uL — ABNORMAL LOW (ref 4.0–10.5)

## 2018-03-30 LAB — COMPREHENSIVE METABOLIC PANEL
ALBUMIN: 4.3 g/dL (ref 3.5–5.0)
ALK PHOS: 48 U/L (ref 38–126)
ALT: 11 U/L (ref 0–44)
ANION GAP: 10 (ref 5–15)
AST: 22 U/L (ref 15–41)
BILIRUBIN TOTAL: 1.3 mg/dL — AB (ref 0.3–1.2)
BUN: 6 mg/dL (ref 6–20)
CALCIUM: 8.8 mg/dL — AB (ref 8.9–10.3)
CO2: 22 mmol/L (ref 22–32)
CREATININE: 0.75 mg/dL (ref 0.44–1.00)
Chloride: 100 mmol/L (ref 98–111)
GFR calc Af Amer: >60 mL/min (ref >60)
GFR calc non Af Amer: >60 mL/min (ref >60)
GLUCOSE: 86 mg/dL (ref 70–99)
Potassium: 3.2 mmol/L — ABNORMAL LOW (ref 3.5–5.1)
Sodium: 132 mmol/L — ABNORMAL LOW (ref 135–145)
TOTAL PROTEIN: 6.6 g/dL (ref 6.5–8.1)

## 2018-03-30 LAB — LIPASE, BLOOD: Lipase: 26 U/L (ref 11–51)

## 2018-03-30 MED ORDER — FENTANYL CITRATE (PF) 100 MCG/2ML IJ SOLN
50.0000 ug | INTRAMUSCULAR | Status: DC | PRN
  Administered 2018-03-30: 50 ug via INTRAVENOUS
  Filled 2018-03-30: qty 2

## 2018-03-30 MED ORDER — SODIUM CHLORIDE 0.9% FLUSH: 3.0000 mL | Freq: Once | INTRAVENOUS | Status: DC

## 2018-03-30 NOTE — ED Triage Notes (Signed)
Pt BIB ems for severe lower abd pain and vomiting. Pt was seen a few days ago and dx with a uterine infection and is supposed to have an outpatient US done. 20G LAC 4 zofran IV given en route. Pt tearful in triage. HR 120s rr24, BP 126/58.

## 2018-03-31 ENCOUNTER — Encounter (HOSPITAL_COMMUNITY): Payer: Self-pay | Admitting: Emergency Medicine

## 2018-03-31 ENCOUNTER — Other Ambulatory Visit: Payer: Self-pay

## 2018-03-31 ENCOUNTER — Observation Stay (HOSPITAL_COMMUNITY)
Admission: EM | Admit: 2018-03-31 | Discharge: 2018-04-01 | Disposition: A | Discharge status: Home / Self Care | Payer: Self-pay | Attending: Internal Medicine | Admitting: Internal Medicine

## 2018-03-31 DIAGNOSIS — N809 Endometriosis, unspecified: Secondary | ICD-10-CM | POA: Insufficient documentation

## 2018-03-31 DIAGNOSIS — R1030 Lower abdominal pain, unspecified: Principal | ICD-10-CM | POA: Insufficient documentation

## 2018-03-31 DIAGNOSIS — Z885 Allergy status to narcotic agent status: Secondary | ICD-10-CM | POA: Insufficient documentation

## 2018-03-31 DIAGNOSIS — F909 Attention-deficit hyperactivity disorder, unspecified type: Secondary | ICD-10-CM | POA: Insufficient documentation

## 2018-03-31 DIAGNOSIS — E785 Hyperlipidemia, unspecified: Secondary | ICD-10-CM | POA: Insufficient documentation

## 2018-03-31 DIAGNOSIS — K219 Gastro-esophageal reflux disease without esophagitis: Secondary | ICD-10-CM | POA: Insufficient documentation

## 2018-03-31 DIAGNOSIS — Z791 Long term (current) use of non-steroidal anti-inflammatories (NSAID): Secondary | ICD-10-CM | POA: Insufficient documentation

## 2018-03-31 DIAGNOSIS — E162 Hypoglycemia, unspecified: Secondary | ICD-10-CM | POA: Insufficient documentation

## 2018-03-31 DIAGNOSIS — F419 Anxiety disorder, unspecified: Secondary | ICD-10-CM | POA: Insufficient documentation

## 2018-03-31 DIAGNOSIS — G8929 Other chronic pain: Secondary | ICD-10-CM | POA: Insufficient documentation

## 2018-03-31 DIAGNOSIS — E876 Hypokalemia: Secondary | ICD-10-CM | POA: Insufficient documentation

## 2018-03-31 DIAGNOSIS — R109 Unspecified abdominal pain: Secondary | ICD-10-CM | POA: Diagnosis present

## 2018-03-31 DIAGNOSIS — F332 Major depressive disorder, recurrent severe without psychotic features: Secondary | ICD-10-CM | POA: Insufficient documentation

## 2018-03-31 DIAGNOSIS — R112 Nausea with vomiting, unspecified: Secondary | ICD-10-CM | POA: Insufficient documentation

## 2018-03-31 DIAGNOSIS — Z79899 Other long term (current) drug therapy: Secondary | ICD-10-CM | POA: Insufficient documentation

## 2018-03-31 LAB — CBC
HEMATOCRIT: 42.7 % (ref 36.0–46.0)
Hemoglobin: 13.1 g/dL (ref 12.0–15.0)
MCH: 26.1 pg (ref 26.0–34.0)
MCHC: 30.7 g/dL (ref 30.0–36.0)
MCV: 85.1 fL (ref 80.0–100.0)
Platelets: 195 10*3/uL (ref 150–400)
RBC: 5.02 MIL/uL (ref 3.87–5.11)
RDW: 13.2 % (ref 11.5–15.5)
WBC: 2.6 10*3/uL — ABNORMAL LOW (ref 4.0–10.5)
nRBC: 0 % (ref 0.0–0.2)

## 2018-03-31 LAB — COMPREHENSIVE METABOLIC PANEL
ALT: 9 U/L (ref 0–44)
AST: 16 U/L (ref 15–41)
Albumin: 3.1 g/dL — ABNORMAL LOW (ref 3.5–5.0)
Alkaline Phosphatase: 34 U/L — ABNORMAL LOW (ref 38–126)
Anion gap: 8 (ref 5–15)
BUN: 6 mg/dL (ref 6–20)
CO2: 17 mmol/L — ABNORMAL LOW (ref 22–32)
Calcium: 6.3 mg/dL — CL (ref 8.9–10.3)
Chloride: 118 mmol/L — ABNORMAL HIGH (ref 98–111)
Creatinine, Ser: 0.47 mg/dL (ref 0.44–1.00)
GFR calc Af Amer: >60 mL/min (ref >60)
GFR calc non Af Amer: >60 mL/min (ref >60)
Glucose, Bld: 65 mg/dL — ABNORMAL LOW (ref 70–99)
Potassium: 2.8 mmol/L — ABNORMAL LOW (ref 3.5–5.1)
Sodium: 143 mmol/L (ref 135–145)
Total Bilirubin: 0.9 mg/dL (ref 0.3–1.2)
Total Protein: 4.8 g/dL — ABNORMAL LOW (ref 6.5–8.1)

## 2018-03-31 LAB — I-STAT BETA HCG BLOOD, ED (MC, WL, AP ONLY): I-stat hCG, quantitative: <5 m[IU]/mL (ref <5)

## 2018-03-31 LAB — LIPASE, BLOOD: Lipase: 25 U/L (ref 11–51)

## 2018-03-31 MED ORDER — SODIUM CHLORIDE 0.9% FLUSH
3.0000 mL | Freq: Once | INTRAVENOUS | Status: AC
  Administered 2018-03-31: 3 mL via INTRAVENOUS

## 2018-03-31 MED ORDER — ONDANSETRON HCL 4 MG/2ML IJ SOLN: 4.0000 mg | Freq: Once | INTRAMUSCULAR | Status: DC | PRN

## 2018-03-31 MED ORDER — SODIUM CHLORIDE 0.9 % IV BOLUS: 1000.0000 mL | Freq: Once | INTRAVENOUS | Status: DC

## 2018-03-31 MED ORDER — ONDANSETRON HCL 4 MG/2ML IJ SOLN
4.0000 mg | Freq: Once | INTRAMUSCULAR | Status: AC
  Administered 2018-03-31: 4 mg via INTRAVENOUS
  Filled 2018-03-31: qty 2

## 2018-03-31 MED ORDER — KETOROLAC TROMETHAMINE 30 MG/ML IJ SOLN
30.0000 mg | Freq: Once | INTRAMUSCULAR | Status: AC
  Administered 2018-03-31: 30 mg via INTRAVENOUS
  Filled 2018-03-31: qty 1

## 2018-03-31 MED ORDER — SODIUM CHLORIDE 0.9 % IV SOLN
1.0000 g | Freq: Once | INTRAVENOUS | Status: DC
  Filled 2018-03-31: qty 10

## 2018-03-31 MED ORDER — POTASSIUM CHLORIDE 10 MEQ/100ML IV SOLN
10.0000 meq | Freq: Once | INTRAVENOUS | Status: AC
  Administered 2018-04-01: 10 meq via INTRAVENOUS
  Filled 2018-03-31: qty 100

## 2018-03-31 NOTE — ED Notes (Signed)
Date and time results received: 03/31/18 2233 (use smartphrase ".now" to insert current time)  Test: calcium  Critical Value: 6.3  Name of Provider Notified: Dr. Rhunette Croft  Orders Received? Or Actions Taken?: Actions Taken: reported calcium 6.3 to Dr Rhunette Croft

## 2018-03-31 NOTE — ED Triage Notes (Signed)
Pt arriving via EMS for lower abdominal pain and N/V. Pt was seen at [facility] yesterday for same but left before finishing treatment. Pt has hx of endometriosis.

## 2018-03-31 NOTE — ED Notes (Signed)
Pt screaming and crying "it hurts so bad, I haven't been able to eat or drink all day. And I cant pee". Pt crying hysterically

## 2018-03-31 NOTE — ED Provider Notes (Signed)
[facility]-EMERGENCY DEPT Provider Note   CSN: [id number] Arrival date & time: 03/31/18  2017    History   Chief Complaint Chief Complaint  Patient presents with  . Abdominal Pain    HPI [name] is a 23 y.o. female past with history of ADHD, depression, GERD, hyperlipidemia, hypoglycemia, chronic abdominal pain who presents for evaluation of worsening lower abdominal pain that began approxi-6 days ago today.  Patient reports that she has a history of chronic abdominal pain and gets the same type of pain every month.  States is related to her cycle.  She reports at onset of symptoms, she was having her cycle.  Patient states that she was told she has endometriosis but states there was no definitive diagnosis.  She was seen in the ED on 03/26/18 for evaluation of symptoms.  At that time, her pain improved and she was discharged.  She was told to follow-up with OB/GYN but states she never followed up.  Patient reports she went to the emergency department last night for evaluation of symptoms but states that she did not wait to be seen.  She comes back today for worsening abdominal pain as well as nausea/vomiting.  She reports multiple episodes of nausea/vomiting over the last 24 hours.  She reports she has had a few episodes with blood-tinged emesis but states no pure hematemesis.  Patient states that the pain is in the lower abdomen and does not radiate.  Patient denies any fevers, chest pain, difficulty breathing, urinary complaints.      The history is provided by the patient.    Past Medical History:  Diagnosis Date  . ADHD (attention deficit hyperactivity disorder)   . Anesthesia complication    woke up fighting after tonsillectomy  . Anxiety   . Cholecystitis   . Depression   . Dysmenorrhea   . GERD (gastroesophageal reflux disease)   . Hyperlipidemia   . Hypoglycemia   . Obesity   . Syncope   . Viral warts    hand  . Vision abnormalities    wears  glasses    Patient Active Problem List   Diagnosis Date Noted  . Polysubstance (excluding opioids) dependence (HCC) 04/11/2017  . MDD (major depressive disorder), recurrent severe, 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  . Post-operative pain 08/26/2014  . Pain, dental   . Cellulitis and abscess of oral soft tissues   . Substance induced mood disorder (HCC) 03/15/2012  . GAD (generalized anxiety disorder) 03/15/2012  . 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 ADHD (attention deficit hyperactivity disorder), combined type 03/15/2012  . Headache(784.0) 09/18/2011  . Palpitations 09/18/2011  . Rapid weight loss 09/18/2011  . Nausea & vomiting 09/17/2011  . Epigastric abdominal pain 09/17/2011  . Pilonidal abscess 04/20/2011  . Chronic constipation 04/20/2011  . Allergy history, radiographic dye 04/20/2011  . Status post laparoscopic cholecystectomy 03/10/2011    Past Surgical History:  Procedure Laterality Date  . CHOLECYSTECTOMY  03/21/2011   Procedure: LAPAROSCOPIC CHOLECYSTECTOMY;  Surgeon: Shelly Rubenstein, MD;  Location: [facility] OR;  Service: General;  Laterality: N/A;  . ESOPHAGOGASTRODUODENOSCOPY  09/26/2011   Procedure: ESOPHAGOGASTRODUODENOSCOPY (EGD);  Surgeon: Jon Gills, MD;  Location: [facility] OR;  Service: Gastroenterology;  Laterality: N/A;  . TONSILLECTOMY AND ADENOIDECTOMY  06/2005  . WISDOM TOOTH EXTRACTION       OB History    Gravida  0   Para      Term      Preterm  AB      Living        SAB      TAB      Ectopic      Multiple      Live Births               Home Medications    Prior to Admission medications   Medication Sig Start Date End Date Taking? Authorizing Provider  acetaminophen (TYLENOL) 500 MG tablet Take 1,000 mg by mouth every 6 (six) hours as needed for moderate pain.   Yes [provider]  albuterol (PROVENTIL HFA) 108 (90 Base) MCG/ACT inhaler Inhale 2 puffs into the lungs every 6 (six) hours as needed for wheezing or  shortness of breath.   Yes [provider]  Ibuprofen (ADVIL) 200 MG CAPS Take 400 mg by mouth every 4 (four) hours as needed (stomach pain).   Yes [provider]  gabapentin (NEURONTIN) 300 MG capsule Take 1 capsule (300 mg total) by mouth at bedtime. Patient not taking: Reported on 03/31/2018 04/16/17   Money, Gerlene Burdock, FNP  hydrOXYzine (ATARAX/VISTARIL) 25 MG tablet Take 1 tablet (25 mg total) by mouth every 6 (six) hours as needed for anxiety. Patient not taking: Reported on 03/31/2018 04/16/17   Money, Gerlene Burdock, FNP  lidocaine (XYLOCAINE) 2 % solution Use as directed 15 mLs in the mouth or throat as needed for mouth pain. Patient not taking: Reported on 03/31/2018 05/09/17   Dietrich Pates, PA-C  QUEtiapine (SEROQUEL) 100 MG tablet Take 1 tablet (100 mg total) by mouth at bedtime. For mood control Patient not taking: Reported on 03/31/2018 04/16/17   Money, Gerlene Burdock, FNP  QUEtiapine (SEROQUEL) 25 MG tablet Take 1 tablet (25 mg total) by mouth daily. For mood control Patient not taking: Reported on 03/31/2018 04/16/17   Money, Gerlene Burdock, FNP  triamcinolone cream (KENALOG) 0.1 % Apply 1 application topically 2 (two) times daily. Patient not taking: Reported on 03/31/2018 05/09/17   Dietrich Pates, PA-C    Family History Family History  Problem Relation Age of Onset  . Anesthesia problems Maternal Grandfather   . Heart disease Maternal Grandfather   . Nephrolithiasis Maternal Grandfather   . Diabetes Maternal Grandfather   . Mental illness Maternal Grandfather   . Cholelithiasis Mother   . Nephrolithiasis Mother   . Depression Mother   . Hypertension Mother   . Miscarriages / [place] Mother   . Anxiety disorder Mother   . Nephrolithiasis Maternal Grandmother   . COPD Maternal Grandmother   . Heart disease Paternal Grandfather   . Cholelithiasis Maternal Aunt   . Depression Maternal Aunt   . Learning disabilities Maternal Aunt   . Bipolar disorder Sister     Social  History Social History   Tobacco Use  . Smoking status: Passive Smoke Exposure - Never Smoker  . Smokeless tobacco: Never Used  Substance Use Topics  . Alcohol use: No  . Drug use: No     Allergies   Blueberry fruit extract; Contrast media [iodinated diagnostic agents]; Codeine; and Omnipaque [iohexol]   Review of Systems Review of Systems  Constitutional: Negative for fever.  Respiratory: Negative for cough and shortness of breath.   Cardiovascular: Negative for chest pain.  Gastrointestinal: Positive for abdominal pain, nausea and vomiting.  Genitourinary: Negative for dysuria and hematuria.  Neurological: Negative for headaches.  All other systems reviewed and are negative.    Physical Exam Updated Vital Signs BP (!) 104/56 (BP Location:  Left Arm)   Pulse 65   Temp 98.8 F (37.1 C) (Oral)   Resp 17   Ht 5\' 3"  (1.6 m)   Wt 68 kg   SpO2 98%   BMI 26.57 kg/m   Physical Exam Vitals signs and nursing note reviewed.  Constitutional:      Appearance: Normal appearance. She is well-developed.     Comments: Appears uncomfortable  HENT:     Head: Normocephalic and atraumatic.  Eyes:     General: Lids are normal.     Conjunctiva/sclera: Conjunctivae normal.     Pupils: Pupils are equal, round, and reactive to light.  Neck:     Musculoskeletal: Full passive range of motion without pain.  Cardiovascular:     Rate and Rhythm: Normal rate and regular rhythm.     Pulses: Normal pulses.     Heart sounds: Normal heart sounds. No murmur. No friction rub. No gallop.   Pulmonary:     Effort: Pulmonary effort is normal.     Breath sounds: Normal breath sounds.  Abdominal:     Palpations: Abdomen is soft. Abdomen is not rigid.     Tenderness: There is abdominal tenderness in the right lower quadrant, suprapubic area and left lower quadrant. There is no guarding.     Comments: Abdomen is soft, nondistended.  Diffuse tenderness noted to the lower abdomen.  No rigidity,  guarding.  Musculoskeletal: Normal range of motion.  Skin:    General: Skin is warm and dry.     Capillary Refill: Capillary refill takes less than 2 seconds.  Neurological:     Mental Status: She is alert and oriented to person, place, and time.  Psychiatric:        Speech: Speech normal.      ED Treatments / Results  Labs (all labs ordered are listed, but only abnormal results are displayed) Labs Reviewed  COMPREHENSIVE METABOLIC PANEL - Abnormal; Notable for the following components:      Result Value   Potassium 2.8 (*)    Chloride 118 (*)    CO2 17 (*)    Glucose, Bld 65 (*)    Calcium 6.3 (*)    Total Protein 4.8 (*)    Albumin 3.1 (*)    Alkaline Phosphatase 34 (*)    All other components within normal limits  CBC - Abnormal; Notable for the following components:   WBC 2.6 (*)    All other components within normal limits  LIPASE, BLOOD  URINALYSIS, ROUTINE W REFLEX MICROSCOPIC  I-STAT BETA HCG BLOOD, ED ([facility], [facility], [facility] ONLY)    EKG None  Radiology No results found.  Procedures Procedures (including critical care time)  Medications Ordered in ED Medications  ondansetron (ZOFRAN) injection 4 mg (has no administration in time range)  sodium chloride 0.9 % bolus 1,000 mL (has no administration in time range)  potassium chloride 10 mEq in 100 mL IVPB (has no administration in time range)  calcium gluconate 2 g/ 100 mL sodium chloride IVPB (has no administration in time range)  potassium chloride 20 MEQ/15ML (10%) solution 60 mEq (has no administration in time range)  magnesium sulfate IVPB 2 g 50 mL (has no administration in time range)  sodium chloride flush (NS) 0.9 % injection 3 mL (3 mLs Intravenous Given 03/31/18 2221)  ondansetron (ZOFRAN) injection 4 mg (4 mg Intravenous Given 03/31/18 2221)  ketorolac (TORADOL) 30 MG/ML injection 30 mg (30 mg Intravenous Given 03/31/18 2221)     Initial  Impression / Assessment and Plan / ED Course  I have reviewed the  triage vital signs and the nursing notes.  Pertinent labs & imaging results that were available during my care of the patient were reviewed by me and considered in my medical decision making (see chart for details).        23 y.o. F who presents for evaluation of lower abdominal pain.  She reports been ongoing for the last 6 days.  She states that she has a history of chronic pain and states that she gets this every month associated with her cycle.  She states that this pain feels similar but states that it keeps coming back.  Additionally, she has had some nausea/vomiting.  No fevers, difficulty breathing. Patient is afebrile, non-toxic appearing, sitting comfortably on examination table. Vital signs reviewed and stable.  On exam, has diffuse tenderness noted to lower abdomen.  She is crying what appears in no acute distress.  We will plan to check basic labs, give fluids, analgesics.  Consider chronic abdominal pain versus pain associated with menstrual cramping versus infectious etiology.  Given diffuse tenderness, lower suspicion for infectious process such as appendicitis.  Additionally, doubt PID, ovarian torsion.  Abdomen is soft, nondistended.  No evidence of rigidity.  Will hold off on any imaging until reexam.  I-STAT beta negative.  Lipase unremarkable.  CBC shows leukopenia of 2.6.  CMP shows hypokalemia of 2.8, bicarb of 17.  Glucose is 65.  Calcium is 6.3.  Review of records shows her calcium has been low previously.  On 03/30/18, her calcium was 8.8.  EKG shows no evidence of QT prolongation.  Patient given IV potassium, calcium to replete.  Given severe hypocalcemia, recommend admission at this time.  Reevaluation after analgesics.  Patient reports her pain has improved after analgesics.  She has not had any episodes of vomiting since being here in ED.  Repeat abdominal exam shows improved tenderness.  Abdomen is soft.  No indication for CT as I do not suspect surgical abdomen.   Additionally, do not suspect PID or ovarian torsion.  No indication for pelvic exam, ultrasound evaluation.  Updated patient on plan.  She is agreeable.  Discussed patient with Dr. Clyde Lundborg (hospitalist). Will plan for admission.   Portions of this note were generated with Scientist, clinical (histocompatibility and immunogenetics). Dictation errors may occur despite best attempts at proofreading.   Final Clinical Impressions(s) / ED Diagnoses   Final diagnoses:  Hypokalemia  Hypocalcemia  Lower abdominal pain  Non-intractable vomiting with nausea, unspecified vomiting type    ED Discharge Orders    None       Maxwell Caul, PA-C 04/01/18 0032    Derwood Kaplan, MD 04/01/18 1535

## 2018-03-31 NOTE — ED Notes (Signed)
Pt requested to leave. IV removed.

## 2018-04-01 ENCOUNTER — Encounter (HOSPITAL_COMMUNITY): Payer: Self-pay | Admitting: Internal Medicine

## 2018-04-01 ENCOUNTER — Observation Stay (HOSPITAL_COMMUNITY): Payer: Self-pay

## 2018-04-01 DIAGNOSIS — R109 Unspecified abdominal pain: Secondary | ICD-10-CM | POA: Diagnosis present

## 2018-04-01 DIAGNOSIS — E162 Hypoglycemia, unspecified: Secondary | ICD-10-CM | POA: Diagnosis present

## 2018-04-01 DIAGNOSIS — E876 Hypokalemia: Secondary | ICD-10-CM | POA: Diagnosis present

## 2018-04-01 DIAGNOSIS — R1032 Left lower quadrant pain: Secondary | ICD-10-CM

## 2018-04-01 LAB — CBG MONITORING, ED: Glucose-Capillary: 140 mg/dL — ABNORMAL HIGH (ref 70–99)

## 2018-04-01 LAB — CBC
HCT: 36.7 % (ref 36.0–46.0)
Hemoglobin: 11.4 g/dL — ABNORMAL LOW (ref 12.0–15.0)
MCH: 27.1 pg (ref 26.0–34.0)
MCHC: 31.1 g/dL (ref 30.0–36.0)
MCV: 87.2 fL (ref 80.0–100.0)
NRBC: 0 % (ref 0.0–0.2)
PLATELETS: 147 10*3/uL — AB (ref 150–400)
RBC: 4.21 MIL/uL (ref 3.87–5.11)
RDW: 13.3 % (ref 11.5–15.5)
WBC: 2.5 10*3/uL — ABNORMAL LOW (ref 4.0–10.5)

## 2018-04-01 LAB — BASIC METABOLIC PANEL
Anion gap: 7 (ref 5–15)
BUN: 9 mg/dL (ref 6–20)
CO2: 22 mmol/L (ref 22–32)
Calcium: 8 mg/dL — ABNORMAL LOW (ref 8.9–10.3)
Chloride: 109 mmol/L (ref 98–111)
Creatinine, Ser: 0.71 mg/dL (ref 0.44–1.00)
GFR calc Af Amer: >60 mL/min (ref >60)
GFR calc non Af Amer: >60 mL/min (ref >60)
Glucose, Bld: 98 mg/dL (ref 70–99)
Potassium: 3.9 mmol/L (ref 3.5–5.1)
Sodium: 138 mmol/L (ref 135–145)

## 2018-04-01 LAB — MAGNESIUM: Magnesium: 2.3 mg/dL (ref 1.7–2.4)

## 2018-04-01 LAB — HIV ANTIBODY (ROUTINE TESTING W REFLEX): HIV Screen 4th Generation wRfx: NONREACTIVE

## 2018-04-01 MED ORDER — CALCIUM GLUCONATE-NACL 2-0.675 GM/100ML-% IV SOLN
2.0000 g | Freq: Once | INTRAVENOUS | Status: AC
  Administered 2018-04-01: 2000 mg via INTRAVENOUS
  Filled 2018-04-01: qty 100

## 2018-04-01 MED ORDER — KETOROLAC TROMETHAMINE 15 MG/ML IJ SOLN
15.0000 mg | Freq: Four times a day (QID) | INTRAMUSCULAR | Status: DC | PRN
  Administered 2018-04-01 (×2): 15 mg via INTRAVENOUS
  Filled 2018-04-01 (×2): qty 1

## 2018-04-01 MED ORDER — SODIUM CHLORIDE 0.9 % IV BOLUS
2000.0000 mL | Freq: Once | INTRAVENOUS | Status: AC
  Administered 2018-04-01: 2000 mL via INTRAVENOUS

## 2018-04-01 MED ORDER — IBUPROFEN 200 MG PO TABS
400.0000 mg | ORAL_TABLET | ORAL | Status: DC | PRN
  Administered 2018-04-01 (×3): 400 mg via ORAL
  Filled 2018-04-01 (×3): qty 2

## 2018-04-01 MED ORDER — DEXTROSE 50 % IV SOLN
50.0000 mL | Freq: Once | INTRAVENOUS | Status: AC
  Administered 2018-04-01: 50 mL via INTRAVENOUS
  Filled 2018-04-01: qty 50

## 2018-04-01 MED ORDER — DEXTROSE 50 % IV SOLN: 50.0000 mL | INTRAVENOUS | Status: DC | PRN

## 2018-04-01 MED ORDER — ONDANSETRON HCL 4 MG PO TABS: 4.0000 mg | ORAL_TABLET | Freq: Every day | ORAL | 1 refills | Status: DC | PRN

## 2018-04-01 MED ORDER — POTASSIUM CHLORIDE ER 20 MEQ PO TBCR: 20.0000 meq | EXTENDED_RELEASE_TABLET | Freq: Every day | ORAL | 0 refills | Status: DC

## 2018-04-01 MED ORDER — MAGNESIUM SULFATE 2 GM/50ML IV SOLN
2.0000 g | Freq: Once | INTRAVENOUS | Status: AC
  Administered 2018-04-01: 2 g via INTRAVENOUS
  Filled 2018-04-01: qty 50

## 2018-04-01 MED ORDER — HYDROCODONE-ACETAMINOPHEN 5-325 MG PO TABS: 1.0000 | ORAL_TABLET | Freq: Four times a day (QID) | ORAL | 0 refills | Status: DC | PRN

## 2018-04-01 MED ORDER — HYDROCODONE-ACETAMINOPHEN 5-325 MG PO TABS: 2.0000 | ORAL_TABLET | Freq: Four times a day (QID) | ORAL | 0 refills | Status: DC | PRN

## 2018-04-01 MED ORDER — PANTOPRAZOLE SODIUM 40 MG PO TBEC: 40.0000 mg | DELAYED_RELEASE_TABLET | Freq: Every day | ORAL | 0 refills | Status: DC

## 2018-04-01 MED ORDER — ACETAMINOPHEN 325 MG PO TABS
650.0000 mg | ORAL_TABLET | Freq: Four times a day (QID) | ORAL | Status: DC | PRN
  Administered 2018-04-01: 650 mg via ORAL
  Filled 2018-04-01: qty 2

## 2018-04-01 MED ORDER — POTASSIUM CHLORIDE 20 MEQ/15ML (10%) PO SOLN
60.0000 meq | Freq: Once | ORAL | Status: AC
  Administered 2018-04-01: 60 meq via ORAL
  Filled 2018-04-01: qty 45

## 2018-04-01 MED ORDER — ALBUTEROL SULFATE (2.5 MG/3ML) 0.083% IN NEBU: 3.0000 mL | INHALATION_SOLUTION | Freq: Four times a day (QID) | RESPIRATORY_TRACT | Status: DC | PRN

## 2018-04-01 MED ORDER — HYDROCODONE-ACETAMINOPHEN 5-325 MG PO TABS: 2.0000 | ORAL_TABLET | Freq: Four times a day (QID) | ORAL | Status: DC | PRN

## 2018-04-01 MED ORDER — FAMOTIDINE IN NACL 20-0.9 MG/50ML-% IV SOLN
20.0000 mg | Freq: Two times a day (BID) | INTRAVENOUS | Status: DC
  Administered 2018-04-01 (×2): 20 mg via INTRAVENOUS
  Filled 2018-04-01 (×2): qty 50

## 2018-04-01 MED ORDER — SODIUM CHLORIDE 0.9 % IV SOLN: 1.0000 g | Freq: Once | INTRAVENOUS | Status: DC

## 2018-04-01 MED ORDER — ONDANSETRON HCL 4 MG/2ML IJ SOLN
4.0000 mg | Freq: Three times a day (TID) | INTRAMUSCULAR | Status: AC | PRN
  Administered 2018-04-01: 4 mg via INTRAVENOUS
  Filled 2018-04-01: qty 2

## 2018-04-01 MED ORDER — CALCIUM GLUCONATE-NACL 1-0.675 GM/50ML-% IV SOLN
1.0000 g | Freq: Once | INTRAVENOUS | Status: DC
  Filled 2018-04-01: qty 50

## 2018-04-01 MED ORDER — ACETAMINOPHEN 650 MG RE SUPP: 650.0000 mg | Freq: Four times a day (QID) | RECTAL | Status: DC | PRN

## 2018-04-01 MED ORDER — MORPHINE SULFATE (PF) 2 MG/ML IV SOLN
2.0000 mg | Freq: Once | INTRAVENOUS | Status: AC
  Administered 2018-04-01: 2 mg via INTRAVENOUS
  Filled 2018-04-01: qty 1

## 2018-04-01 MED ORDER — HYDROCODONE-ACETAMINOPHEN 5-325 MG PO TABS
2.0000 | ORAL_TABLET | Freq: Once | ORAL | Status: AC
  Administered 2018-04-01: 2 via ORAL
  Filled 2018-04-01: qty 2

## 2018-04-01 NOTE — Progress Notes (Signed)
Dr Renford Dills paged regarding patient complaints of continued unrelieved pain and to report that both ultrasounds have been done. Patient states current medication regimen not helping. Lina Sar, RN

## 2018-04-01 NOTE — Discharge Summary (Signed)
Physician Discharge Summary  MARIADELROSARI JENSEN OPF:[id number] DOB: Jun 10, 1995 DOA: 03/31/2018  PCP: [facility]  Admit date: 03/31/2018 Discharge date: 04/01/2018  Admitted From: Home Disposition:  Home  Discharge Condition:Stable CODE STATUS:FULL Diet recommendation: Regular   Brief/Interim Summary: HPI: [name] is a 23 y.o. female with medical history significant of ADHD, depression, endometriosis with chronic abdominal pain, GERD, depression, anxiety, who presents with abdominal pain.  Patient states that she has chronic abdominal pain that goes worse each month with her menstrual cycle.  Her worsening abdominal pain started when she started having menstrual period on 2/28. States that her menstrual period stopped yesterday, but her abdominal pain has been persistent, which has worsened today.  The pain is located in the lower abdomen, right side is worse than the left, constant, 9 out of 10 severity, sharp, nonradiating.  It is associated with nausea and vomiting.  She states that she vomited many times, occasionally with streaks of blood.  No diarrhea.  She has chills, but no fever.  Denies chest pain, shortness breath, cough.  No symptoms of UTI.  She was seen in the ED on 03/26/18 for evaluation of symptoms. At that time, her pain improved and she was discharged home.  ED Course: pt was found to have WBC 2.6, hemoglobin 13.1, negative pregnancy test, lipase 25, pending urinalysis, potassium 2.8, calcium 6.3, blood sugar 65, temperature normal, initially tachycardia, currently heart rate 65, oxygen saturation 98% on room air.  Patient is placed on MedSurg bed for observation.  Hospital Course: Patient's hospital course remained stable.  She was found to be severely hypokalemic on presentation.  Potassium level normal today after supplementation.  She still complains of abdominal pain but surprisingly her pain improves only with opioids. We did ultrasound of the  abdomen and pelvis which did not show any acute intra-abdominal pathology, no adnexal masses or fibroids. Patient is stable for discharge to home today.  She is to follow-up with the PCP  who can make a referral for OB/GYN as an outpatient.  I discussed with case manager and requested her to arrange a follow-up as an outpatient with the PCP.  Following problems were addressed during hospitalization:  Abdominal pain, nausea vomiting: better today. Ultrasound of the abdomen did not show any acute intra-abdominal abnormalities.  MDD (major depressive disorder), recurrent severe, without psychosis (HCC): pt is not taking medications currently.  Denies suicidal or homicidal ideations.  She states she does not feel depressed at present.  Hypokalemia: Supplemented and corrected  Hypocalcemia: Ca 6.3.  -Given calcium gluconate  Hypoglycemia: Blood sugar 65, most likely due to decreased oral intake Blood sugar stable this morning.  Discharge Diagnoses:  Principal Problem:   Abdominal pain Active Problems:   Nausea & vomiting   MDD (major depressive disorder), recurrent severe, without psychosis (HCC)   Hypokalemia   Hypocalcemia   Hypoglycemia    Discharge Instructions  Discharge Instructions    Diet general   Complete by:  As directed    Discharge instructions   Complete by:  As directed    1)Follow up with your PCP in a week.  Make an outpatient appointment with OB/GYN as per the PCPs referral. 2)Take prescribed medications as instructed.   Increase activity slowly   Complete by:  As directed      Allergies as of 04/01/2018      Reactions   Blueberry Fruit Extract Anaphylaxis   Contrast Media [iodinated Diagnostic Agents] Anaphylaxis, Rash  Codeine Hives, Itching, Nausea And Vomiting   Omnipaque [iohexol] Itching, Nausea And Vomiting, Swelling      Medication List    TAKE these medications   acetaminophen 500 MG tablet Commonly known as:  TYLENOL Take 1,000 mg by  mouth every 6 (six) hours as needed for moderate pain.   ADVIL 200 MG Caps Generic drug:  Ibuprofen Take 400 mg by mouth every 4 (four) hours as needed (stomach pain).   gabapentin 300 MG capsule Commonly known as:  NEURONTIN Take 1 capsule (300 mg total) by mouth at bedtime.   HYDROcodone-acetaminophen 5-325 MG tablet Commonly known as:  NORCO/VICODIN Take 2 tablets by mouth every 6 (six) hours as needed for moderate pain.   hydrOXYzine 25 MG tablet Commonly known as:  ATARAX/VISTARIL Take 1 tablet (25 mg total) by mouth every 6 (six) hours as needed for anxiety.   lidocaine 2 % solution Commonly known as:  XYLOCAINE Use as directed 15 mLs in the mouth or throat as needed for mouth pain.   ondansetron 4 MG tablet Commonly known as:  ZOFRAN Take 1 tablet (4 mg total) by mouth daily as needed for nausea or vomiting.   pantoprazole 40 MG tablet Commonly known as:  PROTONIX Take 1 tablet (40 mg total) by mouth daily for 14 days.   Potassium Chloride ER 20 MEQ Tbcr Take 20 mEq by mouth daily.   PROVENTIL HFA 108 (90 Base) MCG/ACT inhaler Generic drug:  albuterol Inhale 2 puffs into the lungs every 6 (six) hours as needed for wheezing or shortness of breath.   QUEtiapine 100 MG tablet Commonly known as:  SEROQUEL Take 1 tablet (100 mg total) by mouth at bedtime. For mood control   QUEtiapine 25 MG tablet Commonly known as:  SEROQUEL Take 1 tablet (25 mg total) by mouth daily. For mood control   triamcinolone cream 0.1 % Commonly known as:  KENALOG Apply 1 application topically 2 (two) times daily.      Follow-up Information    [facility] Follow up.   Specialty:  Internal Medicine Why:  Clinic for people without insurance. Call to get appointment for a new patient to establish care with primary care doctor. Contact information: [address] [place] [id number] [address] [phone]       [facility] Follow up.   Why:  Clinic for people without insurance. Call to get appointment for a new patient to establish care with primary care doctor. Contact information: [address] [place] [address] [phone]         Allergies  Allergen Reactions  . Blueberry Fruit Extract Anaphylaxis  . Contrast Media [Iodinated Diagnostic Agents] Anaphylaxis and Rash  . Codeine Hives, Itching and Nausea And Vomiting  . Omnipaque [Iohexol] Itching, Nausea And Vomiting and Swelling    Consultations:  None   Procedures/Studies: US Abdomen Complete  Result Date: 04/01/2018 CLINICAL DATA:  Abdominal pain EXAM: ABDOMEN ULTRASOUND COMPLETE COMPARISON:  None. FINDINGS: Gallbladder: Surgically removed Common bile duct: Diameter: 3.1 mm Liver: No focal mass lesion is seen. The portal triads appear hyperechoic. This could be related to some underlying hepatic inflammatory change. Correlation with laboratory values is recommended. Portal vein is patent on color Doppler imaging with normal direction of blood flow towards the liver. IVC: No abnormality visualized. Pancreas: Visualized portion unremarkable. Spleen: Spleen is elongated 14.5 cm with a calculated volume of 572 mL. No mass lesion is noted.  Right Kidney: Length: 11.5 cm. Echogenicity within normal limits. No mass or hydronephrosis visualized. Left Kidney: Length: 11.7 cm. Echogenicity within normal limits. No mass or hydronephrosis visualized. Abdominal aorta: No aneurysm visualized. Other findings: None. IMPRESSION: Changes suggestive of mild hepatic inflammatory change. Correlation with laboratory values is recommended. Mild splenomegaly. Electronically Signed   By: Alcide Clever M.D.   On: 04/01/2018 11:48   US Pelvis (transabdominal Only)  Result Date: 04/01/2018 CLINICAL DATA:  Lower abdominal pain for 6 days. EXAM: TRANSABDOMINAL ULTRASOUND OF PELVIS TECHNIQUE: Transabdominal ultrasound examination of the pelvis  was performed including evaluation of the uterus, ovaries, adnexal regions, and pelvic cul-de-sac. COMPARISON:  None. FINDINGS: Uterus Measurements: 7.8 x 3.5 x 5.5 cm = volume: 77.8 mL. No fibroids or other mass visualized. Endometrium Thickness: 4.6 mm.  No focal abnormality visualized. Right ovary Measurements: 2.5 x 2.2 x 2.4 cm = volume: 6.9 mL. Normal appearance/no adnexal mass. Left ovary Measurements: 3.5 x 2.4 x 3.4 cm = volume: 14.9 mL. Normal appearance/no adnexal mass. Other findings:  Small amount of free fluid in the right adnexa. IMPRESSION: No acute process within the pelvis. Electronically Signed   By: Annia Belt M.D.   On: 04/01/2018 12:01      Subjective: Patient seen and examined the bedside this morning.  Hemodynamically stable.  Still complains of some abdominal pain.  Abdomen is soft and nondistended on examination.  Ultrasound of the abdomen did not show any acute intra abdominal pathology.  Stable for discharge.  Discharge Exam: Vitals:   04/01/18 0213 04/01/18 0537  BP: (!) 111/59 (!) 96/46  Pulse: (!) 56 60  Resp: 18 16  Temp: 98.3 F (36.8 C) 98.1 F (36.7 C)  SpO2: 100% 100%   Vitals:   04/01/18 0049 04/01/18 0115 04/01/18 0213 04/01/18 0537  BP: (!) 93/34 (!) 99/49 (!) 111/59 (!) 96/46  Pulse: 67 61 (!) 56 60  Resp: 14 14 18 16   Temp:   98.3 F (36.8 C) 98.1 F (36.7 C)  TempSrc:   Oral Oral  SpO2: 100% 98% 100% 100%  Weight:      Height:        General: Pt is alert, awake, not in acute distress Cardiovascular: RRR, S1/S2 +, no rubs, no gallops Respiratory: CTA bilaterally, no wheezing, no rhonchi Abdominal: Soft, NT, ND, bowel sounds + Extremities: no edema, no cyanosis    The results of significant diagnostics from this hospitalization (including imaging, microbiology, ancillary and laboratory) are listed below for reference.     Microbiology: No results found for this or any previous visit (from the past 240 hour(s)).   Labs: BNP (last  3 results) No results for input(s): BNP in the last 8760 hours. Basic Metabolic Panel: Recent Labs  Lab 03/30/18 2119 03/31/18 2054 04/01/18 0410  NA 132* 143 138  K 3.2* 2.8* 3.9  CL 100 118* 109  CO2 22 17* 22  GLUCOSE 86 65* 98  BUN 6 6 9   CREATININE 0.75 0.47 0.71  CALCIUM 8.8* 6.3* 8.0*  MG  --   --  2.3   Liver Function Tests: Recent Labs  Lab 03/30/18 2119 03/31/18 2054  AST 22 16  ALT 11 9  ALKPHOS 48 34*  BILITOT 1.3* 0.9  PROT 6.6 4.8*  ALBUMIN 4.3 3.1*   Recent Labs  Lab 03/30/18 2119 03/31/18 2054  LIPASE 26 25   No results for input(s): AMMONIA in the last 168 hours. CBC: Recent Labs  Lab 03/30/18 2119 03/31/18  2054 04/01/18 0410  WBC 3.7* 2.6* 2.5*  HGB 12.8 13.1 11.4*  HCT 40.0 42.7 36.7  MCV 82.0 85.1 87.2  PLT 201 195 147*   Cardiac Enzymes: No results for input(s): CKTOTAL, CKMB, CKMBINDEX, TROPONINI in the last 168 hours. BNP: Invalid input(s): POCBNP CBG: Recent Labs  Lab 04/01/18 0143  GLUCAP 140*   D-Dimer No results for input(s): DDIMER in the last 72 hours. Hgb A1c No results for input(s): HGBA1C in the last 72 hours. Lipid Profile No results for input(s): CHOL, HDL, LDLCALC, TRIG, CHOLHDL, LDLDIRECT in the last 72 hours. Thyroid function studies No results for input(s): TSH, T4TOTAL, T3FREE, THYROIDAB in the last 72 hours.  Invalid input(s): FREET3 Anemia work up No results for input(s): VITAMINB12, FOLATE, FERRITIN, TIBC, IRON, RETICCTPCT in the last 72 hours. Urinalysis    Component Value Date/Time   COLORURINE YELLOW 03/30/2018 2027   APPEARANCEUR HAZY (A) 03/30/2018 2027   LABSPEC 1.026 03/30/2018 2027   PHURINE 6.0 03/30/2018 2027   GLUCOSEU NEGATIVE 03/30/2018 2027   HGBUR SMALL (A) 03/30/2018 2027   BILIRUBINUR NEGATIVE 03/30/2018 2027   KETONESUR 80 (A) 03/30/2018 2027   PROTEINUR NEGATIVE 03/30/2018 2027   UROBILINOGEN 0.2 09/21/2013 2250   NITRITE NEGATIVE 03/30/2018 2027   LEUKOCYTESUR LARGE (A)  03/30/2018 2027   Sepsis Labs Invalid input(s): PROCALCITONIN,  WBC,  LACTICIDVEN Microbiology No results found for this or any previous visit (from the past 240 hour(s)).  Please note: You were cared for by a hospitalist during your hospital stay. Once you are discharged, your primary care physician will handle any further medical issues. Please note that NO REFILLS for any discharge medications will be authorized once you are discharged, as it is imperative that you return to your primary care physician (or establish a relationship with a primary care physician if you do not have one) for your post hospital discharge needs so that they can reassess your need for medications and monitor your lab values.    Time coordinating discharge: 40 minutes  SIGNED:   Burnadette Pop, MD  [facility] 04/01/2018, 12:52 PM Pager [phone]  If 7PM-7AM, please contact night-coverage [email or web address] Password [email or web address]

## 2018-04-01 NOTE — Progress Notes (Signed)
Discharge and medication instructions reviewed with patient. Questions answered and patient denies further questions. No prescriptions given to patient. Patient has called a family member to drive her home. Lina Sar, RN

## 2018-04-01 NOTE — Progress Notes (Signed)
Pt provided with Goodrx coupons for DC meds. Sandford Craze RN,BSN [phone]

## 2018-04-01 NOTE — H&P (Addendum)
History and Physical    [name] ZOX:[id number] DOB: [birth year] DOA: 03/31/2018  Referring MD/NP/PA:   PCP: [facility], [facility]   Patient coming from:  The patient is coming from home.  At baseline, pt is independent for most of ADL.        Chief Complaint: Abdominal pain  HPI: [name] is a 23 y.o. female with medical history significant of ADHD, depression, endometriosis with chronic abdominal pain, GERD, depression, anxiety, who presents with abdominal pain.  Patient states that she has chronic abdominal pain that goes worse each month with her menstrual cycle.  Her worsening abdominal pain started when she started having menstrual period on 2/28. States that her menstrual period stopped yesterday, but her abdominal pain has been persistent, which has worsened today.  The pain is located in the lower abdomen, right side is worse than the left, constant, 9 out of 10 severity, sharp, nonradiating.  It is associated with nausea and vomiting.  She states that she vomited many times, occasionally with streaks of blood.  No diarrhea.  She has chills, but no fever.  Denies chest pain, shortness breath, cough.  No symptoms of UTI.  She was seen in the ED on 03/26/18 for evaluation of symptoms.  At that time, her pain improved and she was discharged home.  ED Course: pt was found to have WBC 2.6, hemoglobin 13.1, negative pregnancy test, lipase 25, pending urinalysis, potassium 2.8, calcium 6.3, blood sugar 65, temperature normal, initially tachycardia, currently heart rate 65, oxygen saturation 98% on room air.  Patient is placed on MedSurg bed for observation.  Review of Systems:   General: no fevers, has chills, no body weight gain, has poor appetite, has fatigue HEENT: no blurry vision, hearing changes or sore throat Respiratory: no dyspnea, coughing, wheezing CV: no chest pain, no palpitations GI: has nausea, vomiting, abdominal pain, no diarrhea, constipation GU: no  dysuria, burning on urination, increased urinary frequency, hematuria  Ext: no leg edema Neuro: no unilateral weakness, numbness, or tingling, no vision change or hearing loss Skin: no rash, no skin tear. MSK: No muscle spasm, no deformity, no limitation of range of movement in spin Heme: No easy bruising.  Travel history: No recent long distant travel.  Allergy:  Allergies  Allergen Reactions  . Blueberry Fruit Extract Anaphylaxis  . Contrast Media [Iodinated Diagnostic Agents] Anaphylaxis and Rash  . Codeine Hives, Itching and Nausea And Vomiting  . Omnipaque [Iohexol] Itching, Nausea And Vomiting and Swelling    Past Medical History:  Diagnosis Date  . ADHD (attention deficit hyperactivity disorder)   . Anesthesia complication    woke up fighting after tonsillectomy  . Anxiety   . Cholecystitis   . Depression   . Dysmenorrhea   . GERD (gastroesophageal reflux disease)   . Hyperlipidemia   . Hypoglycemia   . Obesity   . Syncope   . Viral warts    hand  . Vision abnormalities    wears glasses    Past Surgical History:  Procedure Laterality Date  . CHOLECYSTECTOMY  03/21/2011   Procedure: LAPAROSCOPIC CHOLECYSTECTOMY;  Surgeon: Shelly Rubensteinouglas A Blackman, MD;  Location: [facility] OR;  Service: General;  Laterality: N/A;  . ESOPHAGOGASTRODUODENOSCOPY  09/26/2011   Procedure: ESOPHAGOGASTRODUODENOSCOPY (EGD);  Surgeon: Jon GillsJoseph H Clark, MD;  [facility];  Service: Gastroenterology;  Laterality: N/A;  . TONSILLECTOMY AND ADENOIDECTOMY  06/2005  . WISDOM TOOTH EXTRACTION      Social History:  reports that  she is a non-smoker but has been exposed to tobacco smoke. She has never used smokeless tobacco. She reports current drug use. Drug: Marijuana. She reports that she does not drink alcohol.  Family History:  Family History  Problem Relation Age of Onset  . Anesthesia problems Maternal Grandfather   . Heart disease Maternal Grandfather   . Nephrolithiasis Maternal Grandfather   .  Diabetes Maternal Grandfather   . Mental illness Maternal Grandfather   . Cholelithiasis Mother   . Nephrolithiasis Mother   . Depression Mother   . Hypertension Mother   . Miscarriages / [place] Mother   . Anxiety disorder Mother   . Nephrolithiasis Maternal Grandmother   . COPD Maternal Grandmother   . Heart disease Paternal Grandfather   . Cholelithiasis Maternal Aunt   . Depression Maternal Aunt   . Learning disabilities Maternal Aunt   . Bipolar disorder Sister      Prior to Admission medications   Medication Sig Start Date End Date Taking? Authorizing Provider  acetaminophen (TYLENOL) 500 MG tablet Take 1,000 mg by mouth every 6 (six) hours as needed for moderate pain.   Yes [provider]  albuterol (PROVENTIL HFA) 108 (90 Base) MCG/ACT inhaler Inhale 2 puffs into the lungs every 6 (six) hours as needed for wheezing or shortness of breath.   Yes [provider]  Ibuprofen (ADVIL) 200 MG CAPS Take 400 mg by mouth every 4 (four) hours as needed (stomach pain).   Yes [provider]  gabapentin (NEURONTIN) 300 MG capsule Take 1 capsule (300 mg total) by mouth at bedtime. Patient not taking: Reported on 03/31/2018 04/16/17   Money, Gerlene Burdock, FNP  hydrOXYzine (ATARAX/VISTARIL) 25 MG tablet Take 1 tablet (25 mg total) by mouth every 6 (six) hours as needed for anxiety. Patient not taking: Reported on 03/31/2018 04/16/17   Money, Gerlene Burdock, FNP  lidocaine (XYLOCAINE) 2 % solution Use as directed 15 mLs in the mouth or throat as needed for mouth pain. Patient not taking: Reported on 03/31/2018 05/09/17   Dietrich Pates, PA-C  QUEtiapine (SEROQUEL) 100 MG tablet Take 1 tablet (100 mg total) by mouth at bedtime. For mood control Patient not taking: Reported on 03/31/2018 04/16/17   Money, Gerlene Burdock, FNP  QUEtiapine (SEROQUEL) 25 MG tablet Take 1 tablet (25 mg total) by mouth daily. For mood control Patient not taking: Reported on 03/31/2018 04/16/17   Money, Gerlene Burdock, FNP    triamcinolone cream (KENALOG) 0.1 % Apply 1 application topically 2 (two) times daily. Patient not taking: Reported on 03/31/2018 05/09/17   Dietrich Pates, PA-C    Physical Exam: Vitals:   03/31/18 2024 03/31/18 2230 04/01/18 0049  BP: 106/64 (!) 104/56 (!) 93/34  Pulse: 93 65 67  Resp: Temp: 98.8 F (37.1 C)    TempSrc: Oral    SpO2: 98% 98% 100%  Weight: 68 kg    Height:  (1.6 m)     General: Not in acute distress HEENT:       Eyes: PERRL, EOMI, no scleral icterus.       ENT: No discharge from the ears and nose, no pharynx injection, no tonsillar enlargement.        Neck: No JVD, no bruit, no mass felt. Heme: No neck lymph node enlargement. Cardiac: S1/S2, RRR, No murmurs, No gallops or rubs. Respiratory: No rales, wheezing, rhonchi or rubs. GI: Soft, nondistended, has tenderness in lower abdoment, no rebound pain, no organomegaly, BS  present. GU: No hematuria Ext: No pitting leg edema bilaterally. 2+DP/PT pulse bilaterally. Musculoskeletal: No joint deformities, No joint redness or warmth, no limitation of ROM in spin. Skin: No rashes.  Neuro: Alert, oriented X3, cranial nerves II-XII grossly intact, moves all extremities normally.  Psych: Patient is not psychotic, no suicidal or hemocidal ideation.  Labs on Admission: I have personally reviewed following labs and imaging studies  CBC: Recent Labs  Lab 03/30/18 2119 03/31/18 2054  WBC 3.7* 2.6*  HGB 12.8 13.1  HCT 40.0 42.7  MCV 82.0 85.1  PLT 201 195   Basic Metabolic Panel: Recent Labs  Lab 03/30/18 2119 03/31/18 2054  NA 132* 143  K 3.2* 2.8*  CL 100 118*  CO2 22 17*  GLUCOSE 86 65*  BUN 6 6  CREATININE 0.75 0.47  CALCIUM 8.8* 6.3*   GFR: Estimated Creatinine Clearance: 102.9 mL/min (by C-G formula based on SCr of 0.47 mg/dL). Liver Function Tests: Recent Labs  Lab 03/30/18 2119 03/31/18 2054  AST 22 16  ALT 11 9  ALKPHOS 48 34*  BILITOT 1.3* 0.9  PROT 6.6 4.8*  ALBUMIN 4.3  3.1*   Recent Labs  Lab 03/30/18 2119 03/31/18 2054  LIPASE 26 25   No results for input(s): AMMONIA in the last 168 hours. Coagulation Profile: No results for input(s): INR, PROTIME in the last 168 hours. Cardiac Enzymes: No results for input(s): CKTOTAL, CKMB, CKMBINDEX, TROPONINI in the last 168 hours. BNP (last 3 results) No results for input(s): PROBNP in the last 8760 hours. HbA1C: No results for input(s): HGBA1C in the last 72 hours. CBG: No results for input(s): GLUCAP in the last 168 hours. Lipid Profile: No results for input(s): CHOL, HDL, LDLCALC, TRIG, CHOLHDL, LDLDIRECT in the last 72 hours. Thyroid Function Tests: No results for input(s): TSH, T4TOTAL, FREET4, T3FREE, THYROIDAB in the last 72 hours. Anemia Panel: No results for input(s): VITAMINB12, FOLATE, FERRITIN, TIBC, IRON, RETICCTPCT in the last 72 hours. Urine analysis:    Component Value Date/Time   COLORURINE YELLOW 03/30/2018 2027   APPEARANCEUR HAZY (A) 03/30/2018 2027   LABSPEC 1.026 03/30/2018 2027   PHURINE 6.0 03/30/2018 2027   GLUCOSEU NEGATIVE 03/30/2018 2027   HGBUR SMALL (A) 03/30/2018 2027   BILIRUBINUR NEGATIVE 03/30/2018 2027   KETONESUR 80 (A) 03/30/2018 2027   PROTEINUR NEGATIVE 03/30/2018 2027   UROBILINOGEN 0.2 09/21/2013 2250   NITRITE NEGATIVE 03/30/2018 2027   LEUKOCYTESUR LARGE (A) 03/30/2018 2027   Sepsis Labs: @LABRCNTIP (procalcitonin:4,lacticidven:4) )No results found for this or any previous visit (from the past 240 hour(s)).   Radiological Exams on Admission: No results found.   EKG: Independently reviewed.  Sinus rhythm, QTC 442, nonspecific T wave change.   Assessment/Plan Principal Problem:   Abdominal pain Active Problems:   Nausea & vomiting   MDD (major depressive disorder), recurrent severe, without psychosis (HCC)   Hypokalemia   Hypocalcemia   Hypoglycemia   Abdominal pain, nausea vomiting: pt has lower abdominal pain, right side is worse than the  left.  On physical examination, her abdomen is soft, no guarding and no rebound pain.  Most likely due to endometriosis. No indication for emergent imaging.   -will place on med-surg bed for obs -prn Tylenol, ibuprofen, ketorolac for pain -PRN Zofran for nausea -IV fluid: 2 L normal saline bolus -follow-up with gynecology -pepcid 20 mg bid by IV  MDD (major depressive disorder), recurrent severe, without psychosis (HCC): pt is not taking medications currently.  Denies suicidal or homicidal  ideations.  She states she does not feel depressed today. -Observe  Hypokalemia: K=2.8 on admission. - Repleted with 10 mEq of IV and 60 or oral KCl - Check Mg level - Give 1 g of magnesium sulfate'  Hypocalcemia: Ca 6.3.  -Give 2 g of calcium gluconate -Check vitamin D 1,25 level  Hypoglycemia: Blood sugar 65, most likely due to decreased oral intake -CBG q1h -D50 prn   DVT ppx: SCD Code Status: Full code Family Communication: None at bed side.   Disposition Plan:  Anticipate discharge back to previous home environment Consults called:  none Admission status:  medical floor/obs    Date of Service 04/01/2018    Lorretta Harp [facility]   If 7PM-7AM, please contact night-coverage [email or web address] Password [email or web address] 04/01/2018, 1:02 AM

## 2018-04-02 LAB — CALCITRIOL (1,25 DI-OH VIT D): Vit D, 1,25-Dihydroxy: 65.8 pg/mL (ref 19.9–79.3)

## 2018-11-16 ENCOUNTER — Other Ambulatory Visit: Payer: Self-pay

## 2018-11-16 ENCOUNTER — Ambulatory Visit (INDEPENDENT_AMBULATORY_CARE_PROVIDER_SITE_OTHER): Payer: Medicaid Other | Admitting: Obstetrics and Gynecology

## 2018-11-16 ENCOUNTER — Other Ambulatory Visit (HOSPITAL_COMMUNITY)
Admission: RE | Admit: 2018-11-16 | Discharge: 2018-11-16 | Disposition: A | Discharge status: Home / Self Care | Payer: Medicaid Other | Source: Ambulatory Visit | Attending: Obstetrics and Gynecology | Admitting: Obstetrics and Gynecology

## 2018-11-16 ENCOUNTER — Ambulatory Visit: Payer: Self-pay | Admitting: Clinical

## 2018-11-16 ENCOUNTER — Encounter: Payer: Self-pay | Admitting: Obstetrics and Gynecology

## 2018-11-16 VITALS — BP 120/73 | HR 87 | Wt 145.9 lb

## 2018-11-16 DIAGNOSIS — F329 Major depressive disorder, single episode, unspecified: Secondary | ICD-10-CM

## 2018-11-16 DIAGNOSIS — Z0000 Encounter for general adult medical examination without abnormal findings: Secondary | ICD-10-CM | POA: Diagnosis not present

## 2018-11-16 DIAGNOSIS — F909 Attention-deficit hyperactivity disorder, unspecified type: Secondary | ICD-10-CM

## 2018-11-16 DIAGNOSIS — F411 Generalized anxiety disorder: Secondary | ICD-10-CM

## 2018-11-16 DIAGNOSIS — Z202 Contact with and (suspected) exposure to infections with a predominantly sexual mode of transmission: Secondary | ICD-10-CM | POA: Insufficient documentation

## 2018-11-16 DIAGNOSIS — Z01419 Encounter for gynecological examination (general) (routine) without abnormal findings: Secondary | ICD-10-CM

## 2018-11-16 DIAGNOSIS — Z30011 Encounter for initial prescription of contraceptive pills: Secondary | ICD-10-CM

## 2018-11-16 DIAGNOSIS — Z309 Encounter for contraceptive management, unspecified: Secondary | ICD-10-CM | POA: Insufficient documentation

## 2018-11-16 HISTORY — DX: Major depressive disorder, single episode, unspecified: F32.9

## 2018-11-16 HISTORY — DX: Generalized anxiety disorder: F41.1

## 2018-11-16 MED ORDER — NORETHIN ACE-ETH ESTRAD-FE 1-20 MG-MCG(24) PO TABS: 1.0000 | ORAL_TABLET | Freq: Every day | ORAL | 11 refills | Status: DC

## 2018-11-16 NOTE — Patient Instructions (Signed)
Behavioral Health Resources:   What if I or someone I know is in crisis?  . If you are thinking about harming yourself or having thoughts of suicide, or if you know someone who is, seek help right away.  . Call your doctor or mental health care provider.  . Call 911 or go to a hospital emergency room to get immediate help, or ask a friend or family member to help you do these things.  . Call the [place] National Suicide Prevention Lifeline's toll-free, 24-hour hotline at 1-800-273-TALK (1-800-273-8255) or TTY: 1-800-799-4 TTY (1-800-799-4889) to talk to a trained counselor.  . If you are in crisis, make sure you are not left alone.   . If someone else is in crisis, make sure he or she is not left alone   24 Hour :   [place] National Suicide Hotline: 1-800-273-8255  Therapeutic Alternative Mobile Crisis: 1-877-626-1772   [facility]  [address], [place], [place] [address]  800-711-2635 or 336-832-9700  [facility] Crisis Line (Domestic Violence, Rape & Victim Assistance)  336-273-7273  [facility]  [address] [place], [place]  [address]   1-855-788-8787 or 336-676-6840   [facility]: 336-899-1505 (8am-4pm) or 1-866- 261-5769 (after hours)           [facility], [address], [place], [place]  1-800-711-2635 Fax: 336-832-9701 [email or web address]  *Interpreters available *Accepts Medicaid, Medicare, uninsured   [facility]   Mon-Fri: 8am-5pm [address], [place], [place] [phone](phone); [phone](fax) www.[email or web address]  *Accepts Medicare  [facility] Mon, Tues, Thurs, Fri: 8am-4pm [address], [place], [place]  [phone] (phone); 336-256-0121 (fax) www.[email or web address]  *Accepts Medicare  [facility] Mon-Fri: 9am-5pm  [address], [place], [place] [phone] (phone); 336-540-9454  www.[email or web address]  *Accepts Medicaid  [facility] [address], [place], [place]  [phone]  [email or web address]://[email or web address]   [facility] Mon-Fri, 8:30am-12pm/1pm-2:30pm [address], [place], [place] [phone] (phone); [phone] (fax) www.[email or web address]  *Accepts Medicaid, sliding-scale*Bilingual services available  [facility] Mon-Fri, 8am-7pm [address], [place], [place]  [phone](phone); 336-899-8811(fax) www.[email or web address]  *Accepts Medicaid *Bilingual services available  [facility] Mon-Fri: 8am-5pm, Saturday by appointment only [address], [place], [place] [phone] (phone); [phone] (fax) www.[email or web address]   [facility] [address], [place], [place] [phone] www.[email or web address]  *Free & reduced services for uninsured and underinsured individuals *Bilingual services for Spanish-speaking clients 21 and under  [facility], [address], [place], [place] [phone](phone); 336-676-6409(fax) www.[email or web address]  *Bring your own interpreter at first visit *Accepts Medicare and Medicaid  [facility] Mon-Fri: 9am-5:30pm [address], [place], [place] [phone] (phone), [phone] (fax) After hours crisis line: 336-763-1165 www.[email or web address]  *Accepts Medicare and Medicaid  [facility] Mon-Thurs, 8am-6pm [address], [place], [place]  [phone] (phone); [phone] (fax) http://[email or web address]  *Subsidized costs available  [facility] Mon-Fri: 8am-4pm [address], [place],  [place] [phone](phone); [phone](fax) www.[email or web address]  *Accepts Medicaid  [facility] [place] [facility] Same day access hours: Mon-Fri, 8:30-3pm Crisis hours: Mon-Fri, 8am-5pm [address], [place], [place]  [facility] Same day access hours: Mon-Fri, 8:30-3pm Crisis hours: Mon-Fri, 8am-8pm [address], [place],  [phone] (phone); [phone] (fax) www.[email or web address]  *Accepts Medicaid and Medicare  [facility] Mon, Wed, Fri: 9am-9pm Tues, Thurs: 9am-6pm [address],   [place], [place]  [phone] (phone); [phone] (fax) [email or web address]://[email or web address]  *(Accepts Medicare and Medicaid; payment plans available)*Bilingual services available  [facility] [address], [place], [place] [phone] (phone); [phone] (fax) www.[email or web address]   [facility] [address], [place], [place]  [phone]  www.[email or web address]  *Bilingual services available  [facility] Mon-Thurs: 8am-8pm [address], [place], [place] [phone] (phone); [phone] (fax) [email or web address]://[email or web address]  *Accepts Medicaid*Bilingual services available  [facility] [address], [place], [place] [phone] (phone) [email or web address]://[email or web address]  *Accepts Medicaid *Bilingual services available  [facility] Mon-Fri, 9am-4:45pm [address], [place], [place] [phone] (phone); [phone] (fax) [email or web address]://[email or web address]  *Accepts Medicare  [facility] Mon-Thurs: 8:30-8pm, Fri: 8:30am-7pm [address], [place], [place] (3rd floor) [phone] (phone); [phone] (fax) [email or web address]://[email or web address]  *Accepts Medicaid; income-based reduced rates available  [facility] Mon-Fri: 8am-5pm [address], [place],  [place] [phone] (phone); [phone] (fax) [email or web address]://[email or web address]  *Accepts Medicaid*Bilingual services available  [facility] [address], [place], [place]  [phone] (phone); [phone] (fax) www.[email or web address]  *Free emergency housing and clinical services for youth in crisis  [facility]  [address],  336-373-1402 www.mhag.org  *Provides direct services to individuals in recovery from mental illness, including support groups, recovery skills classes, and one on one peer support  NAMI (National Alliance on Mental Illness) [place] NAMI helpline: 336-370-4264  https://nam[facility].org  *A community hub for information relating to local resources and services for the friends and families of individuals living alongside a mental health condition, as well as the individuals themselves. Classes and support groups also provided   /Emotional Wellbeing Apps and Websites Here are a few free apps meant to help you to help yourself.  To find, try searching on the internet to see if the app is offered on Apple/Android devices. If your first choice doesn't come up on your device, the good news is that there are many choices! Play around with different apps to see which ones are helpful to you.    Calm This is an app meant to help increase calm feelings. Includes info, strategies, and tools for tracking your feelings.      Calm Harm  This app is meant to help with self-harm. Provides many 5-minute or 15-min coping strategies for doing instead of hurting yourself.       Healthy Minds Health Minds is a problem-solving tool to help deal with emotions and cope with stress you encounter wherever you are.      MindShift This app can help people cope with anxiety. Rather than trying to avoid anxiety, you can make an important shift and face it.      MY3  MY3 features a support system, safety plan and  resources with the goal of offering a tool to use in a time of need.       My Life My Voice  This mood journal offers a simple solution for tracking your thoughts, feelings and moods. Animated emoticons can help identify your mood.       Relax Melodies Designed to help with sleep, on this app you can mix sounds and meditations for relaxation.      Smiling Mind Smiling Mind is meditation made easy: it's a simple tool that helps put a smile on your mind.        Stop, Breathe & Think  A friendly, simple guide for people through meditations for mindfulness and compassion.  Stop,   Breathe and Think Kids Enter your current feelings and choose a "mission" to help you cope. Offers videos for certain moods instead of just sound recordings.       Team [place] The goal of this tool is to help teens change how they think, act, and react. This app helps you focus on your own good feelings and experiences.      The Virtual Hope Box The Virtual Hope Box (VHB) contains simple tools to help patients with coping, relaxation, distraction, and positive thinking.      

## 2018-11-16 NOTE — Addendum Note (Signed)
Addended by: Annabell Howells on: 11/16/2018 02:45 PM   Modules accepted: Orders

## 2018-11-16 NOTE — Patient Instructions (Signed)
Health Maintenance, Female Adopting a healthy lifestyle and getting preventive care are important in promoting health and wellness. Ask your health care provider about:  The right schedule for you to have regular tests and exams.  Things you can do on your own to prevent diseases and keep yourself healthy. What should I know about diet, weight, and exercise? Eat a healthy diet   Eat a diet that includes plenty of vegetables, fruits, low-fat dairy products, and lean protein.  Do not eat a lot of foods that are high in solid fats, added sugars, or sodium. Maintain a healthy weight Body mass index (BMI) is used to identify weight problems. It estimates body fat based on height and weight. Your health care provider can help determine your BMI and help you achieve or maintain a healthy weight. Get regular exercise Get regular exercise. This is one of the most important things you can do for your health. Most adults should:  Exercise for at least 150 minutes each week. The exercise should increase your heart rate and make you sweat (moderate-intensity exercise).  Do strengthening exercises at least twice a week. This is in addition to the moderate-intensity exercise.  Spend less time sitting. Even light physical activity can be beneficial. Watch cholesterol and blood lipids Have your blood tested for lipids and cholesterol at 22 years of age, then have this test every 5 years. Have your cholesterol levels checked more often if:  Your lipid or cholesterol levels are high.  You are older than 22 years of age.  You are at high risk for heart disease. What should I know about cancer screening? Depending on your health history and family history, you may need to have cancer screening at various ages. This may include screening for:  Breast cancer.  Cervical cancer.  Colorectal cancer.  Skin cancer.  Lung cancer. What should I know about heart disease, diabetes, and high blood  pressure? Blood pressure and heart disease  High blood pressure causes heart disease and increases the risk of stroke. This is more likely to develop in people who have high blood pressure readings, are of African descent, or are overweight.  Have your blood pressure checked: ? Every 3-5 years if you are 18-39 years of age. ? Every year if you are 40 years old or older. Diabetes Have regular diabetes screenings. This checks your fasting blood sugar level. Have the screening done:  Once every three years after age 40 if you are at a normal weight and have a low risk for diabetes.  More often and at a younger age if you are overweight or have a high risk for diabetes. What should I know about preventing infection? Hepatitis B If you have a higher risk for hepatitis B, you should be screened for this virus. Talk with your health care provider to find out if you are at risk for hepatitis B infection. Hepatitis C Testing is recommended for:  Everyone born from 1945 through 1965.  Anyone with known risk factors for hepatitis C. Sexually transmitted infections (STIs)  Get screened for STIs, including gonorrhea and chlamydia, if: ? You are sexually active and are younger than 22 years of age. ? You are older than 22 years of age and your health care provider tells you that you are at risk for this type of infection. ? Your sexual activity has changed since you were last screened, and you are at increased risk for chlamydia or gonorrhea. Ask your health care provider if   you are at risk.  Ask your health care provider about whether you are at high risk for HIV. Your health care provider may recommend a prescription medicine to help prevent HIV infection. If you choose to take medicine to prevent HIV, you should first get tested for HIV. You should then be tested every 3 months for as long as you are taking the medicine. Pregnancy  If you are about to stop having your period (premenopausal) and  you may become pregnant, seek counseling before you get pregnant.  Take 400 to 800 micrograms (mcg) of folic acid every day if you become pregnant.  Ask for birth control (contraception) if you want to prevent pregnancy. Osteoporosis and menopause Osteoporosis is a disease in which the bones lose minerals and strength with aging. This can result in bone fractures. If you are 65 years old or older, or if you are at risk for osteoporosis and fractures, ask your health care provider if you should:  Be screened for bone loss.  Take a calcium or vitamin D supplement to lower your risk of fractures.  Be given hormone replacement therapy (HRT) to treat symptoms of menopause. Follow these instructions at home: Lifestyle  Do not use any products that contain nicotine or tobacco, such as cigarettes, e-cigarettes, and chewing tobacco. If you need help quitting, ask your health care provider.  Do not use street drugs.  Do not share needles.  Ask your health care provider for help if you need support or information about quitting drugs. Alcohol use  Do not drink alcohol if: ? Your health care provider tells you not to drink. ? You are pregnant, may be pregnant, or are planning to become pregnant.  If you drink alcohol: ? Limit how much you use to 0-1 drink a day. ? Limit intake if you are breastfeeding.  Be aware of how much alcohol is in your drink. In the [place] one drink equals one 12 oz bottle of beer (355 mL), one 5 oz glass of wine (148 mL), or one 1 oz glass of hard liquor (44 mL). General instructions  Schedule regular health, dental, and eye exams.  Stay current with your vaccines.  Tell your health care provider if: ? You often feel depressed. ? You have ever been abused or do not feel safe at home. Summary  Adopting a healthy lifestyle and getting preventive care are important in promoting health and wellness.  Follow your health care provider's instructions about healthy  diet, exercising, and getting tested or screened for diseases.  Follow your health care provider's instructions on monitoring your cholesterol and blood pressure. This information is not intended to replace advice given to you by your health care provider. Make sure you discuss any questions you have with your health care provider. Document Released: 07/29/2010 Document Revised: 01/06/2018 Document Reviewed: 01/06/2018 Elsevier Patient Education  2020 Elsevier Inc.  

## 2018-11-16 NOTE — BH Specialist Note (Signed)
Integrated Behavioral Health Initial Visit  MRN: [id number] Name: [name]  Number of [place] Clinician visits:: 1/6 Session Start time: 2:54 Session End time: 3:08 Total time: 15 minutes  Type of Service: [place] Interpretor:No. Interpretor Name and Language: n/a   Warm Hand Off Completed.       SUBJECTIVE: [name] is a 23 y.o. female accompanied by n/a Patient was referred by Arlina Robes, MD for anxiety. Patient reports the following symptoms/concerns: Pt states her primary concern today is anxiety that is present "always"; was previously treated with therapy and medication at [facility] until about March 2020; pt open to learning self-coping strategy to implement daily, prior to re-establishing care with therapy/psychiatry.  Duration of problem: Ongoing; Severity of problem: moderate  OBJECTIVE: Mood: Normal and Affect: Appropriate Risk of harm to self or others: No plan to harm self or others  LIFE CONTEXT: Family and Social: - School/Work: - Self-Care: - Life Changes: -  GOALS ADDRESSED: Patient will: 1. Reduce symptoms of: anxiety  INTERVENTIONS: Interventions utilized: Optician, dispensing and Psychoeducation and/or Health Education  Standardized Assessments completed: GAD-7 and PHQ 9  ASSESSMENT: Patient currently experiencing Generalized anxiety disorder and ADHD and MDD as previously diagnosed   Patient may benefit from psychoeducation and brief therapeutic interventions regarding coping with symptoms of anxiety today .  PLAN: 1. Follow up with behavioral health clinician on : three weeks 2. Behavioral recommendations:  -CALM relaxation breathing exercise daily; continue to self coping strategy (breathing in paper bag) if panic begins again -Re-establish with previous therapist 3. Referral(s): [facility] (In  Clinic) 4. "From scale of 1-10, how likely are you to follow plan?": -  Melinda Fair, LCSW   Depression screen [facility] 2/9 11/16/2018  Decreased Interest 2  Down, Depressed, Hopeless 2  PHQ - 2 Score 4  Altered sleeping 2  Tired, decreased energy 2  Change in appetite 0  Feeling bad or failure about yourself  0  Trouble concentrating 0  Moving slowly or fidgety/restless 0  Suicidal thoughts 0  PHQ-9 Score 8   GAD 7 : Generalized Anxiety Score 11/16/2018  Nervous, Anxious, on Edge 1  Control/stop worrying 1  Worry too much - different things 1  Trouble relaxing 1  Restless 1  Easily annoyed or irritable 1  Afraid - awful might happen 1  Total GAD 7 Score 7

## 2018-11-16 NOTE — Progress Notes (Signed)
[name] is a 23 y.o. G0P0 female here for a routine annual gynecologic exam.  Current complaints: cramps with her cycles.  Desires OCP's ( has used in the past) and STD testing.  Denies abnormal vaginal bleeding, discharge, problems with intercourse or other gynecologic concerns.    Gynecologic History No LMP recorded. Contraception: condoms Last Pap: NA.  Last mammogram: NA.    Obstetric History OB History  Gravida Para Term Preterm AB Living  0            SAB TAB Ectopic Multiple Live Births               Past Medical History:  Diagnosis Date  . ADHD (attention deficit hyperactivity disorder)   . Anesthesia complication    woke up fighting after tonsillectomy  . Anxiety   . Cholecystitis   . Depression   . Dysmenorrhea   . GERD (gastroesophageal reflux disease)   . Hyperlipidemia   . Hypoglycemia   . Obesity   . Syncope   . Viral warts    hand  . Vision abnormalities    wears glasses    Past Surgical History:  Procedure Laterality Date  . CHOLECYSTECTOMY  03/21/2011   Procedure: LAPAROSCOPIC CHOLECYSTECTOMY;  Surgeon: Shelly Rubenstein, MD;  Location: [facility] OR;  Service: General;  Laterality: N/A;  . ESOPHAGOGASTRODUODENOSCOPY  09/26/2011   Procedure: ESOPHAGOGASTRODUODENOSCOPY (EGD);  Surgeon: Jon Gills, MD;  Location[facility] OR;  Service: Gastroenterology;  Laterality: N/A;  . TONSILLECTOMY AND ADENOIDECTOMY  06/2005  . WISDOM TOOTH EXTRACTION      Current Outpatient Medications on File Prior to Visit  Medication Sig Dispense Refill  . acetaminophen (TYLENOL) 500 MG tablet Take 1,000 mg by mouth every 6 (six) hours as needed for moderate pain.    Marland Kitchen gabapentin (NEURONTIN) 300 MG capsule Take 1 capsule (300 mg total) by mouth at bedtime. 30 capsule 0  . Ibuprofen (ADVIL) 200 MG CAPS Take 400 mg by mouth every 4 (four) hours as needed (stomach pain).     No current facility-administered medications on file prior to visit.     Allergies  Allergen Reactions   . Blueberry Fruit Extract Anaphylaxis  . Contrast Media [Iodinated Diagnostic Agents] Anaphylaxis and Rash  . Codeine Hives, Itching and Nausea And Vomiting  . Omnipaque [Iohexol] Itching, Nausea And Vomiting and Swelling    Social History   Socioeconomic History  . Marital status: Single    Spouse name: Not on file  . Number of children: Not on file  . Years of education: Not on file  . Highest education level: Not on file  Occupational History  . 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 Financial resource strain: Not on file  . Food insecurity    Worry: Not on file    Inability: Not on file  . Transportation needs    Medical: Not on file    Non-medical: Not on file  Tobacco Use  . Smoking status: Passive Smoke Exposure - Never Smoker  . Smokeless tobacco: Never Used  Substance and Sexual Activity  . Alcohol use: No  . Drug use: Yes    Types: Marijuana  . Sexual activity: Not Currently  Lifestyle  . Physical activity    Days per week: Not on file    Minutes per session: Not on file  . Stress: Not on file  Relationships  . Social Musician on  phone: Not on file    Gets together: Not on file    Attends religious service: Not on file    Active member of club or organization: Not on file    Attends meetings of clubs or organizations: Not on file    Relationship status: Not on file  . Intimate partner violence    Fear of current or ex partner: Not on file    Emotionally abused: Not on file    Physically abused: Not on file    Forced sexual activity: Not on file  Other Topics Concern  . Not on file  Social History Narrative   Repeating 9th grade (missed 40 days last year despite homebound instruction)    Family History  Problem Relation Age of Onset  . Anesthesia problems Maternal Grandfather   . Heart disease Maternal Grandfather   . Nephrolithiasis Maternal Grandfather   . Diabetes Maternal Grandfather   . Mental  illness Maternal Grandfather   . Cholelithiasis Mother   . Nephrolithiasis Mother   . Depression Mother   . Hypertension Mother   . Miscarriages / [place] Mother   . Anxiety disorder Mother   . Nephrolithiasis Maternal Grandmother   . COPD Maternal Grandmother   . Heart disease Paternal Grandfather   . Cholelithiasis Maternal Aunt   . Depression Maternal Aunt   . Learning disabilities Maternal Aunt   . Bipolar disorder Sister     The following portions of the patient's history were reviewed and updated as appropriate: allergies, current medications, past family history, past medical history, past social history, past surgical history and problem list.  Review of Systems Pertinent items noted in HPI and remainder of comprehensive ROS otherwise negative.   Objective:  Wt 145 lb 14.4 oz (66.2 kg)   BMI 25.85 kg/m  CONSTITUTIONAL: Well-developed, well-nourished female in no acute distress.  HENT:  Normocephalic, atraumatic, External right and left ear normal. Oropharynx is clear and moist EYES: Conjunctivae and EOM are normal. Pupils are equal, round, and reactive to light. No scleral icterus.  NECK: Normal range of motion, supple, no masses.  Normal thyroid.  SKIN: Skin is warm and dry. No rash noted. Not diaphoretic. No erythema. No pallor. Arnold: Alert and oriented to person, place, and time. Normal reflexes, muscle tone coordination. No cranial nerve deficit noted. PSYCHIATRIC: Normal mood and affect. Normal behavior. Normal judgment and thought content. CARDIOVASCULAR: Normal heart rate noted, regular rhythm RESPIRATORY: Clear to auscultation bilaterally. Effort and breath sounds normal, no problems with respiration noted. BREASTS: Symmetric in size. No masses, skin changes, nipple drainage, or lymphadenopathy. ABDOMEN: Soft, normal bowel sounds, no distention noted.  No tenderness, rebound or guarding.  PELVIC: Normal appearing external genitalia; normal appearing  vaginal mucosa and cervix.  No abnormal discharge noted.  Pap smear obtained.  Normal uterine size, no other palpable masses, no uterine or adnexal tenderness. MUSCULOSKELETAL: Normal range of motion. No tenderness.  No cyanosis, clubbing, or edema.  2+ distal pulses.   Assessment:  Annual gynecologic examination with pap smear  STD testing Contraception management Plan:  Will follow up results of pap smear and manage accordingly. STD testing per pt request. U/R/B and back up method reviewed with pt.  Routine preventative health maintenance measures emphasized. Please refer to After Visit Summary for other counseling recommendations.    Chancy Milroy, MD, [place] Attending [place] for [facility], [place]

## 2018-11-17 LAB — HEPATITIS B SURFACE ANTIGEN: Hepatitis B Surface Ag: NEGATIVE

## 2018-11-17 LAB — HIV ANTIBODY (ROUTINE TESTING W REFLEX): HIV Screen 4th Generation wRfx: NONREACTIVE

## 2018-11-17 LAB — HEPATITIS C ANTIBODY: Hep C Virus Ab: <0.1 s/co ratio (ref 0.0–0.9)

## 2018-11-17 LAB — RPR: RPR Ser Ql: NONREACTIVE

## 2018-11-18 LAB — CYTOLOGY - PAP
Chlamydia: NEGATIVE
Comment: NEGATIVE
Comment: NEGATIVE
Comment: NORMAL
Diagnosis: NEGATIVE
Neisseria Gonorrhea: NEGATIVE
Trichomonas: NEGATIVE

## 2018-12-01 ENCOUNTER — Encounter (HOSPITAL_COMMUNITY): Payer: Self-pay | Admitting: *Deleted

## 2018-12-01 ENCOUNTER — Telehealth: Payer: Self-pay | Admitting: Family Medicine

## 2018-12-01 ENCOUNTER — Inpatient Hospital Stay (HOSPITAL_COMMUNITY): Payer: Medicaid Other

## 2018-12-01 ENCOUNTER — Other Ambulatory Visit: Payer: Self-pay

## 2018-12-01 ENCOUNTER — Inpatient Hospital Stay (HOSPITAL_COMMUNITY)
Admission: AD | Admit: 2018-12-01 | Discharge: 2018-12-01 | Disposition: A | Discharge status: Home / Self Care | Payer: Medicaid Other | Attending: Obstetrics & Gynecology | Admitting: Obstetrics & Gynecology

## 2018-12-01 DIAGNOSIS — O3481 Maternal care for other abnormalities of pelvic organs, first trimester: Secondary | ICD-10-CM

## 2018-12-01 DIAGNOSIS — O99331 Smoking (tobacco) complicating pregnancy, first trimester: Secondary | ICD-10-CM | POA: Insufficient documentation

## 2018-12-01 DIAGNOSIS — E785 Hyperlipidemia, unspecified: Secondary | ICD-10-CM | POA: Insufficient documentation

## 2018-12-01 DIAGNOSIS — N83209 Unspecified ovarian cyst, unspecified side: Secondary | ICD-10-CM

## 2018-12-01 DIAGNOSIS — J029 Acute pharyngitis, unspecified: Secondary | ICD-10-CM | POA: Insufficient documentation

## 2018-12-01 DIAGNOSIS — O99281 Endocrine, nutritional and metabolic diseases complicating pregnancy, first trimester: Secondary | ICD-10-CM | POA: Insufficient documentation

## 2018-12-01 DIAGNOSIS — Z3A01 Less than 8 weeks gestation of pregnancy: Secondary | ICD-10-CM | POA: Insufficient documentation

## 2018-12-01 DIAGNOSIS — O23591 Infection of other part of genital tract in pregnancy, first trimester: Secondary | ICD-10-CM

## 2018-12-01 DIAGNOSIS — Z793 Long term (current) use of hormonal contraceptives: Secondary | ICD-10-CM | POA: Insufficient documentation

## 2018-12-01 DIAGNOSIS — B373 Candidiasis of vulva and vagina: Secondary | ICD-10-CM | POA: Insufficient documentation

## 2018-12-01 DIAGNOSIS — O98811 Other maternal infectious and parasitic diseases complicating pregnancy, first trimester: Secondary | ICD-10-CM | POA: Insufficient documentation

## 2018-12-01 DIAGNOSIS — R109 Unspecified abdominal pain: Secondary | ICD-10-CM

## 2018-12-01 DIAGNOSIS — F17200 Nicotine dependence, unspecified, uncomplicated: Secondary | ICD-10-CM | POA: Insufficient documentation

## 2018-12-01 DIAGNOSIS — O99211 Obesity complicating pregnancy, first trimester: Secondary | ICD-10-CM | POA: Insufficient documentation

## 2018-12-01 DIAGNOSIS — O26899 Other specified pregnancy related conditions, unspecified trimester: Secondary | ICD-10-CM

## 2018-12-01 DIAGNOSIS — R102 Pelvic and perineal pain: Secondary | ICD-10-CM | POA: Insufficient documentation

## 2018-12-01 DIAGNOSIS — E669 Obesity, unspecified: Secondary | ICD-10-CM | POA: Insufficient documentation

## 2018-12-01 DIAGNOSIS — O26891 Other specified pregnancy related conditions, first trimester: Secondary | ICD-10-CM | POA: Insufficient documentation

## 2018-12-01 LAB — CBC WITH DIFFERENTIAL/PLATELET
Abs Immature Granulocytes: 0.02 10*3/uL (ref 0.00–0.07)
Basophils Absolute: 0 10*3/uL (ref 0.0–0.1)
Basophils Relative: 1 %
Eosinophils Absolute: 0.1 10*3/uL (ref 0.0–0.5)
Eosinophils Relative: 2 %
HCT: 36.5 % (ref 36.0–46.0)
Hemoglobin: 11.8 g/dL — ABNORMAL LOW (ref 12.0–15.0)
Immature Granulocytes: 1 %
Lymphocytes Relative: 19 %
Lymphs Abs: 0.8 10*3/uL (ref 0.7–4.0)
MCH: 26.8 pg (ref 26.0–34.0)
MCHC: 32.3 g/dL (ref 30.0–36.0)
MCV: 82.8 fL (ref 80.0–100.0)
Monocytes Absolute: 0.3 10*3/uL (ref 0.1–1.0)
Monocytes Relative: 7 %
Neutro Abs: 3.1 10*3/uL (ref 1.7–7.7)
Neutrophils Relative %: 70 %
Platelets: 153 10*3/uL (ref 150–400)
RBC: 4.41 MIL/uL (ref 3.87–5.11)
RDW: 12.7 % (ref 11.5–15.5)
WBC: 4.4 10*3/uL (ref 4.0–10.5)
nRBC: 0 % (ref 0.0–0.2)

## 2018-12-01 LAB — WET PREP, GENITAL
Clue Cells Wet Prep HPF POC: NONE SEEN
Sperm: NONE SEEN
Trich, Wet Prep: NONE SEEN

## 2018-12-01 LAB — POCT PREGNANCY, URINE: Preg Test, Ur: POSITIVE — AB

## 2018-12-01 LAB — HCG, QUANTITATIVE, PREGNANCY: hCG, Beta Chain, Quant, S: 57719 m[IU]/mL — ABNORMAL HIGH (ref <5)

## 2018-12-01 LAB — HIV ANTIBODY (ROUTINE TESTING W REFLEX): HIV Screen 4th Generation wRfx: NONREACTIVE

## 2018-12-01 MED ORDER — TERCONAZOLE 0.8 % VA CREA: 1.0000 | TOPICAL_CREAM | Freq: Every day | VAGINAL | 0 refills | Status: DC

## 2018-12-01 MED ORDER — PRENATAL VITAMIN 27-0.8 MG PO TABS: 1.0000 | ORAL_TABLET | Freq: Every day | ORAL | 11 refills | Status: DC

## 2018-12-01 NOTE — MAU Note (Signed)
Presents with c/o abdominal cramping, denies VB.  LMP 10/14/2018.  Reports +HPT. Also c/o sore throat

## 2018-12-01 NOTE — Telephone Encounter (Signed)
Attempted to call patient with her strep appointment 11/5 @ 4:10. No answer, left voicemail with the appointment information. Patient instructed to wear a face mask and no visitors.

## 2018-12-01 NOTE — Discharge Instructions (Signed)
Safe Medications in Pregnancy  ° °Acne: °Benzoyl Peroxide °Salicylic Acid ° °Backache/Headache: °Tylenol: 2 regular strength every 4 hours OR °             2 Extra strength every 6 hours ° °Colds/Coughs/Allergies: °Benadryl (alcohol free) 25 mg every 6 hours as needed °Breath right strips °Claritin °Cepacol throat lozenges °Chloraseptic throat spray °Cold-Eeze- up to three times per day °Cough drops, alcohol free °Flonase (by prescription only) °Guaifenesin °Mucinex °Robitussin DM (plain only, alcohol free) °Saline nasal spray/drops °Sudafed (pseudoephedrine) & Actifed ** use only after [redacted] weeks gestation and if you do not have high blood pressure °Tylenol °Vicks Vaporub °Zinc lozenges °Zyrtec  ° °Constipation: °Colace °Ducolax suppositories °Fleet enema °Glycerin suppositories °Metamucil °Milk of magnesia °Miralax °Senokot °Smooth move tea ° °Diarrhea: °Kaopectate °Imodium A-D ° °*NO pepto Bismol ° °Hemorrhoids: °Anusol °Anusol HC °Preparation H °Tucks ° °Indigestion: °Tums °Maalox °Mylanta °Zantac  °Pepcid ° °Insomnia: °Benadryl (alcohol free) 25mg every 6 hours as needed °Tylenol PM °Unisom, no Gelcaps ° °Leg Cramps: °Tums °MagGel ° °Nausea/Vomiting:  °Bonine °Dramamine °Emetrol °Ginger extract °Sea bands °Meclizine  °Nausea medication to take during pregnancy:  °Unisom (doxylamine succinate 25 mg tablets) Take one tablet daily at bedtime. If symptoms are not adequately controlled, the dose can be increased to a maximum recommended dose of two tablets daily (1/2 tablet in the morning, 1/2 tablet mid-afternoon and one at bedtime). °Vitamin B6 100mg tablets. Take one tablet twice a day (up to 200 mg per day). ° °Skin Rashes: °Aveeno products °Benadryl cream or 25mg every 6 hours as needed °Calamine Lotion °1% cortisone cream ° °Yeast infection: °Gyne-lotrimin 7 °Monistat 7 ° ° °**If taking multiple medications, please check labels to avoid duplicating the same active ingredients °**take medication as directed on  the label °** Do not exceed 4000 mg of tylenol in 24 hours °**Do not take medications that contain aspirin or ibuprofen ° ° ° ° °First Trimester of Pregnancy ° °The first trimester of pregnancy is from week 1 until the end of week 13 (months 1 through 3). During this time, your baby will begin to develop inside you. At 6-8 weeks, the eyes and face are formed, and the heartbeat can be seen on ultrasound. At the end of 12 weeks, all the baby's organs are formed. Prenatal care is all the medical care you receive before the birth of your baby. Make sure you get good prenatal care and follow all of your doctor's instructions. °Follow these instructions at home: °Medicines °· Take over-the-counter and prescription medicines only as told by your doctor. Some medicines are safe and some medicines are not safe during pregnancy. °· Take a prenatal vitamin that contains at least 600 micrograms (mcg) of folic acid. °· If you have trouble pooping (constipation), take medicine that will make your stool soft (stool softener) if your doctor approves. °Eating and drinking ° °· Eat regular, healthy meals. °· Your doctor will tell you the amount of weight gain that is right for you. °· Avoid raw meat and uncooked cheese. °· If you feel sick to your stomach (nauseous) or throw up (vomit): °? Eat 4 or 5 small meals a day instead of 3 large meals. °? Try eating a few soda crackers. °? Drink liquids between meals instead of during meals. °· To prevent constipation: °? Eat foods that are high in fiber, like fresh fruits and vegetables, whole grains, and beans. °? Drink enough fluids to keep your pee (urine) clear or pale yellow. °  Activity °· Exercise only as told by your doctor. Stop exercising if you have cramps or pain in your lower belly (abdomen) or low back. °· Do not exercise if it is too hot, too humid, or if you are in a place of great height (high altitude). °· Try to avoid standing for long periods of time. Move your legs often  if you must stand in one place for a long time. °· Avoid heavy lifting. °· Wear low-heeled shoes. Sit and stand up straight. °· You can have sex unless your doctor tells you not to. °Relieving pain and discomfort °· Wear a good support bra if your breasts are sore. °· Take warm water baths (sitz baths) to soothe pain or discomfort caused by hemorrhoids. Use hemorrhoid cream if your doctor says it is okay. °· Rest with your legs raised if you have leg cramps or low back pain. °· If you have puffy, bulging veins (varicose veins) in your legs: °? Wear support hose or compression stockings as told by your doctor. °? Raise (elevate) your feet for 15 minutes, 3-4 times a day. °? Limit salt in your food. °Prenatal care °· Schedule your prenatal visits by the twelfth week of pregnancy. °· Write down your questions. Take them to your prenatal visits. °· Keep all your prenatal visits as told by your doctor. This is important. °Safety °· Wear your seat belt at all times when driving. °· Make a list of emergency phone numbers. The list should include numbers for family, friends, the hospital, and police and fire departments. °General instructions °· Ask your doctor for a referral to a local prenatal class. Begin classes no later than at the start of month 6 of your pregnancy. °· Ask for help if you need counseling or if you need help with nutrition. Your doctor can give you advice or tell you where to go for help. °· Do not use hot tubs, steam rooms, or saunas. °· Do not douche or use tampons or scented sanitary pads. °· Do not cross your legs for long periods of time. °· Avoid all herbs and alcohol. Avoid drugs that are not approved by your doctor. °· Do not use any tobacco products, including cigarettes, chewing tobacco, and electronic cigarettes. If you need help quitting, ask your doctor. You may get counseling or other support to help you quit. °· Avoid cat litter boxes and soil used by cats. These carry germs that can  cause birth defects in the baby and can cause a loss of your baby (miscarriage) or stillbirth. °· Visit your dentist. At home, brush your teeth with a soft toothbrush. Be gentle when you floss. °Contact a doctor if: °· You are dizzy. °· You have mild cramps or pressure in your lower belly. °· You have a nagging pain in your belly area. °· You continue to feel sick to your stomach, you throw up, or you have watery poop (diarrhea). °· You have a bad smelling fluid coming from your vagina. °· You have pain when you pee (urinate). °· You have increased puffiness (swelling) in your face, hands, legs, or ankles. °Get help right away if: °· You have a fever. °· You are leaking fluid from your vagina. °· You have spotting or bleeding from your vagina. °· You have very bad belly cramping or pain. °· You gain or lose weight rapidly. °· You throw up blood. It may look like coffee grounds. °· You are around people who have German measles, fifth disease,   or chickenpox. °· You have a very bad headache. °· You have shortness of breath. °· You have any kind of trauma, such as from a fall or a car accident. °Summary °· The first trimester of pregnancy is from week 1 until the end of week 13 (months 1 through 3). °· To take care of yourself and your unborn baby, you will need to eat healthy meals, take medicines only if your doctor tells you to do so, and do activities that are safe for you and your baby. °· Keep all follow-up visits as told by your doctor. This is important as your doctor will have to ensure that your baby is healthy and growing well. °This information is not intended to replace advice given to you by your health care provider. Make sure you discuss any questions you have with your health care provider. °Document Released: 07/02/2007 Document Revised: 05/06/2018 Document Reviewed: 01/22/2016 °Elsevier Patient Education © 2020 Elsevier Inc[employer] ° °

## 2018-12-01 NOTE — MAU Provider Note (Signed)
History     CSN: [id number]  Arrival date and time: 12/01/18 1046   First Provider Initiated Contact with Patient 12/01/18 1350      Chief Complaint  Patient presents with  . Abdominal Pain   HPI  [name] is a 23 y.o. G1P0 at [redacted]w[redacted]d who presents to MAU today with complaint of intermittent lower abdominal cramping x 1 week and sore throat x 3-4 days . The patient denies vaginal bleeding. She rates pain at 4/10 at the most and has tried ibuprofen and Tylenol which help. She states LMP 10/14/18 and recent +HPT. She denies congestion or fever or sick contacts. She states that she has been COVID tested numerous times and is negative.   OB History    Gravida  1   Para      Term      Preterm      AB      Living        SAB      TAB      Ectopic      Multiple      Live Births              Past Medical History:  Diagnosis Date  . ADHD (attention deficit hyperactivity disorder)   . Anesthesia complication    woke up fighting after tonsillectomy  . Anxiety   . Cholecystitis   . Depression   . Dysmenorrhea   . GERD (gastroesophageal reflux disease)   . Hyperlipidemia   . Hypoglycemia   . Obesity   . Syncope   . Viral warts    hand  . Vision abnormalities    wears glasses    Past Surgical History:  Procedure Laterality Date  . CHOLECYSTECTOMY  03/21/2011   Procedure: LAPAROSCOPIC CHOLECYSTECTOMY;  Surgeon: Shelly Rubenstein, MD;  Location: [facility] OR;  Service: General;  Laterality: N/A;  . ESOPHAGOGASTRODUODENOSCOPY  09/26/2011   Procedure: ESOPHAGOGASTRODUODENOSCOPY (EGD);  Surgeon: Jon Gills, MD;  Location: [facility] OR;  Service: Gastroenterology;  Laterality: N/A;  . TONSILLECTOMY AND ADENOIDECTOMY  06/2005  . WISDOM TOOTH EXTRACTION      Family History  Problem Relation Age of Onset  . Anesthesia problems Maternal Grandfather   . Heart disease Maternal Grandfather   . Nephrolithiasis Maternal Grandfather   . Diabetes Maternal Grandfather   .  Mental illness Maternal Grandfather   . Cholelithiasis Mother   . Nephrolithiasis Mother   . Depression Mother   . Hypertension Mother   . Miscarriages / [place] Mother   . Anxiety disorder Mother   . Gout Father   . Nephrolithiasis Maternal Grandmother   . COPD Maternal Grandmother   . Heart disease Paternal Grandfather   . Cholelithiasis Maternal Aunt   . Depression Maternal Aunt   . Learning disabilities Maternal Aunt   . Bipolar disorder Sister     Social History   Tobacco Use  . Smoking status: Current Some Day Smoker  . Smokeless tobacco: Never Used  Substance Use Topics  . Alcohol use: No  . Drug use: Not Currently    Types: Marijuana    Allergies:  Allergies  Allergen Reactions  . Blueberry Fruit Extract Anaphylaxis  . Contrast Media [Iodinated Diagnostic Agents] Anaphylaxis and Rash  . Codeine Hives, Itching and Nausea And Vomiting  . Omnipaque [Iohexol] Itching, Nausea And Vomiting and Swelling    Medications Prior to Admission  Medication Sig Dispense Refill Last Dose  . acetaminophen (TYLENOL) 500 MG  tablet Take 1,000 mg by mouth every 6 (six) hours as needed for moderate pain.   11/26/2018 at Unknown time  . Ibuprofen (ADVIL) 200 MG CAPS Take 400 mg by mouth every 4 (four) hours as needed (stomach pain).   11/30/2018 at 1800  . gabapentin (NEURONTIN) 300 MG capsule Take 1 capsule (300 mg total) by mouth at bedtime. 30 capsule 0 11/24/2018 at 2200  . Norethindrone Acetate-Ethinyl Estrad-FE (LOESTRIN 24 FE) 1-20 MG-MCG(24) tablet Take 1 tablet by mouth daily. 1 Package 11     Review of Systems  Constitutional: Negative for fever.  HENT: Positive for sore throat. Negative for congestion, rhinorrhea, sinus pressure and sinus pain.   Respiratory: Negative for cough and shortness of breath.   Gastrointestinal: Positive for abdominal pain, nausea and vomiting. Negative for constipation and diarrhea.  Genitourinary: Negative for dysuria, frequency, urgency,  vaginal bleeding and vaginal discharge.   Physical Exam   Blood pressure (!) 105/59, pulse 100, temperature 97.8 F (36.6 C), temperature source Oral, resp. rate 20, height 5\' 3"  (1.6 m), weight 65.6 kg, last menstrual period 10/14/2018, SpO2 100 %.  Physical Exam  Nursing note and vitals reviewed. Constitutional: She is oriented to person, place, and time. She appears well-developed and well-nourished. No distress.  HENT:  Head: Normocephalic and atraumatic.  Cardiovascular: Normal rate.  Respiratory: Effort normal.  GI: Soft. She exhibits no distension and no mass. There is no abdominal tenderness. There is no rebound and no guarding.  Neurological: She is alert and oriented to person, place, and time.  Skin: Skin is warm and dry. No erythema.  Psychiatric: She has a normal mood and affect.   Results for orders placed or performed during the hospital encounter of 12/01/18 (from the past 24 hour(s))  Pregnancy, urine POC     Status: Abnormal   Collection Time: 12/01/18 10:57 AM  Result Value Ref Range   Preg Test, Ur POSITIVE (A) NEGATIVE  CBC with Differential/Platelet     Status: Abnormal   Collection Time: 12/01/18 11:40 AM  Result Value Ref Range   WBC 4.4 4.0 - 10.5 K/uL   RBC 4.41 3.87 - 5.11 MIL/uL   Hemoglobin 11.8 (L) 12.0 - 15.0 g/dL   HCT 36.5 36.0 - 46.0 %   MCV 82.8 80.0 - 100.0 fL   MCH 26.8 26.0 - 34.0 pg   MCHC 32.3 30.0 - 36.0 g/dL   RDW 12.7 11.5 - 15.5 %   Platelets 153 150 - 400 K/uL   nRBC 0.0 0.0 - 0.2 %   Neutrophils Relative % 70 %   Neutro Abs 3.1 1.7 - 7.7 K/uL   Lymphocytes Relative 19 %   Lymphs Abs 0.8 0.7 - 4.0 K/uL   Monocytes Relative 7 %   Monocytes Absolute 0.3 0.1 - 1.0 K/uL   Eosinophils Relative 2 %   Eosinophils Absolute 0.1 0.0 - 0.5 K/uL   Basophils Relative 1 %   Basophils Absolute 0.0 0.0 - 0.1 K/uL   Immature Granulocytes 1 %   Abs Immature Granulocytes 0.02 0.00 - 0.07 K/uL  hCG, quantitative, pregnancy     Status: Abnormal    Collection Time: 12/01/18 11:40 AM  Result Value Ref Range   hCG, Beta Chain, Quant, S 57,719 (H) <5 mIU/mL  ABO/Rh     Status: None   Collection Time: 12/01/18 11:40 AM  Result Value Ref Range   ABO/RH(D)      A POS Performed at [facility], [address] [place], [place] [id number]   HIV Antibody (routine testing w rflx)     Status: None   Collection Time: 12/01/18 11:40 AM  Result Value Ref Range   HIV Screen 4th Generation wRfx NON REACTIVE NON REACTIVE  Wet prep, genital     Status: Abnormal   Collection Time: 12/01/18 11:47 AM  Result Value Ref Range   Yeast Wet Prep HPF POC PRESENT (A) NONE SEEN   Trich, Wet Prep NONE SEEN NONE SEEN   Clue Cells Wet Prep HPF POC NONE SEEN NONE SEEN   WBC, Wet Prep HPF POC MANY (A) NONE SEEN   Sperm NONE SEEN    [place] Ob Less Than 14 Weeks With Ob Transvaginal  Result Date: 12/01/2018 CLINICAL DATA:  Pelvic cramping. EXAM: OBSTETRIC <14 WK US AND TRANSVAGINAL OB US TECHNIQUE: Both transabdominal and transvaginal ultrasound examinations were performed for complete evaluation of the gestation as well as the maternal uterus, adnexal regions, and pelvic cul-de-sac. Transvaginal technique was performed to assess early pregnancy. COMPARISON:  04/01/2018 FINDINGS: Intrauterine gestational sac: Present Yolk sac:  Present Embryo:  Present Cardiac Activity: Present Heart Rate: 100 bpm CRL:  2.18 mm   5 w   5 d                  US EDC: 07/29/2019 Subchorionic hemorrhage:  None visualized. Maternal uterus/adnexae: Normal ovaries. Hemorrhagic corpus luteum cyst noted on the left. IMPRESSION: Single living intrauterine embryo estimated at 5 weeks and 5 days gestation with heart rate of 100 beats per minute. Normal ovaries. Electronically Signed   By: Rudie MeyerP.  Gallerani M.D.   On: 12/01/2018 13:25    MAU Course  Procedures None  MDM +UPT today  UA, wet prep, GC/chlamydia, CBC, ABO/Rh, quant hCG, HIV, RPR and US today to rule out ectopic pregnancy Patient  instructed on self swab  Assessment and Plan  A: SIUP at [id number] Hemorrhagic ovarian cyst  Abdominal pain in pregnancy, first trimester  Yeast vulvovaginitis  Sore throat  P:  Discharge home Rx for Terazol cream and PNV sent to patient's pharmacy  Rapid Strep swab pending. Will contact patient with results later today.  List of OTC medications safe in pregnancy for symptom management given  First trimester  precautions discussed Patient advised to follow-up with [facility] as planned to start prenatal care  Patient may return to MAU as needed or if her condition were to change or worsen  Vonzella NippleJulie Jd Mccaster, PA-C 12/01/2018, 1:50 PM

## 2018-12-02 ENCOUNTER — Ambulatory Visit: Payer: Medicaid Other

## 2018-12-02 LAB — ABO/RH: ABO/RH(D): A POS

## 2018-12-02 LAB — GC/CHLAMYDIA PROBE AMP (~~LOC~~) NOT AT ARMC
Chlamydia: NEGATIVE
Comment: NEGATIVE
Comment: NORMAL
Neisseria Gonorrhea: NEGATIVE

## 2018-12-02 LAB — RPR: RPR Ser Ql: NONREACTIVE

## 2018-12-03 ENCOUNTER — Other Ambulatory Visit: Payer: Self-pay

## 2018-12-03 ENCOUNTER — Inpatient Hospital Stay (HOSPITAL_COMMUNITY)
Admission: AD | Admit: 2018-12-03 | Discharge: 2018-12-03 | Disposition: A | Discharge status: Home / Self Care | Payer: Medicaid Other | Attending: Obstetrics & Gynecology | Admitting: Obstetrics & Gynecology

## 2018-12-03 ENCOUNTER — Encounter (HOSPITAL_COMMUNITY): Payer: Self-pay | Admitting: *Deleted

## 2018-12-03 ENCOUNTER — Ambulatory Visit: Payer: Medicaid Other

## 2018-12-03 DIAGNOSIS — Z3A01 Less than 8 weeks gestation of pregnancy: Secondary | ICD-10-CM | POA: Insufficient documentation

## 2018-12-03 DIAGNOSIS — E860 Dehydration: Secondary | ICD-10-CM

## 2018-12-03 DIAGNOSIS — O211 Hyperemesis gravidarum with metabolic disturbance: Secondary | ICD-10-CM | POA: Insufficient documentation

## 2018-12-03 DIAGNOSIS — O219 Vomiting of pregnancy, unspecified: Secondary | ICD-10-CM

## 2018-12-03 LAB — URINALYSIS, ROUTINE W REFLEX MICROSCOPIC
Bilirubin Urine: NEGATIVE
Glucose, UA: NEGATIVE mg/dL
Hgb urine dipstick: NEGATIVE
Ketones, ur: 80 mg/dL — AB
Leukocytes,Ua: NEGATIVE
Nitrite: NEGATIVE
Protein, ur: 30 mg/dL — AB
Specific Gravity, Urine: 1.031 — ABNORMAL HIGH (ref 1.005–1.030)
pH: 5 (ref 5.0–8.0)

## 2018-12-03 MED ORDER — LACTATED RINGERS IV BOLUS
1000.0000 mL | Freq: Once | INTRAVENOUS | Status: AC
  Administered 2018-12-03: 1000 mL via INTRAVENOUS

## 2018-12-03 MED ORDER — PROMETHAZINE HCL 25 MG/ML IJ SOLN: 25.0000 mg | Freq: Once | INTRAMUSCULAR | Status: DC

## 2018-12-03 MED ORDER — PROMETHAZINE HCL 25 MG/ML IJ SOLN
25.0000 mg | Freq: Once | INTRAVENOUS | Status: AC
  Administered 2018-12-03: 14:00:00 25 mg via INTRAVENOUS
  Filled 2018-12-03: qty 1

## 2018-12-03 MED ORDER — LACTATED RINGERS IV BOLUS: 1000.0000 mL | Freq: Once | INTRAVENOUS | Status: DC

## 2018-12-03 MED ORDER — PROMETHAZINE HCL 25 MG PO TABS: 12.5000 mg | ORAL_TABLET | Freq: Four times a day (QID) | ORAL | 1 refills | Status: DC | PRN

## 2018-12-03 NOTE — MAU Note (Signed)
Is real sick.  Can't keep no food or fluids down.  Can't keep nothing down. Having pain in rt lower abd, was dx with a cyst.

## 2018-12-03 NOTE — Discharge Instructions (Signed)
Morning Sickness  Morning sickness is when you feel sick to your stomach (nauseous) during pregnancy. You may feel sick to your stomach and throw up (vomit). You may feel sick in the morning, but you can feel this way at any time of day. Some women feel very sick to their stomach and cannot stop throwing up (hyperemesis gravidarum). Follow these instructions at home: Medicines  Take over-the-counter and prescription medicines only as told by your doctor. Do not take any medicines until you talk with your doctor about them first.  Taking multivitamins before getting pregnant can stop or lessen the harshness of morning sickness. Eating and drinking  Eat dry toast or crackers before getting out of bed.  Eat 5 or 6 small meals a day.  Eat dry and bland foods like rice and baked potatoes.  Do not eat greasy, fatty, or spicy foods.  Have someone cook for you if the smell of food causes you to feel sick or throw up.  If you feel sick to your stomach after taking prenatal vitamins, take them at night or with a snack.  Eat protein when you need a snack. Nuts, yogurt, and cheese are good choices.  Drink fluids throughout the day.  Try ginger ale made with real ginger, ginger tea made from fresh grated ginger, or ginger candies. General instructions  Do not use any products that have nicotine or tobacco in them, such as cigarettes and e-cigarettes. If you need help quitting, ask your doctor.  Use an air purifier to keep the air in your house free of smells.  Get lots of fresh air.  Try to avoid smells that make you feel sick.  Try: ? Wearing a bracelet that is used for seasickness (acupressure wristband). ? Going to a doctor who puts thin needles into certain body points (acupuncture) to improve how you feel. Contact a doctor if:  You need medicine to feel better.  You feel dizzy or light-headed.  You are losing weight. Get help right away if:  You feel very sick to your  stomach and cannot stop throwing up.  You pass out (faint).  You have very bad pain in your belly. Summary  Morning sickness is when you feel sick to your stomach (nauseous) during pregnancy.  You may feel sick in the morning, but you can feel this way at any time of day.  Making some changes to what you eat may help your symptoms go away. This information is not intended to replace advice given to you by your health care provider. Make sure you discuss any questions you have with your health care provider. Document Released: 02/21/2004 Document Revised: 12/26/2016 Document Reviewed: 02/14/2016 Elsevier Patient Education  2020 Elsevier Inc.  

## 2018-12-03 NOTE — MAU Provider Note (Signed)
History     CSN: [id number]  Arrival date and time: 12/03/18 1220   First Provider Initiated Contact with Patient 12/03/18 1312      Chief Complaint  Patient presents with  . Emesis   [name] is a 23 y.o. G1P0 at G1P0 who presents today with nausea and vomiting. She states that this started on 11/4, and has gotten to the point that she is vomiting about 10x per day.   Emesis  This is a new problem. Episode onset: 12/01/2018. The problem occurs more than 10 times per day. The problem has been unchanged. The emesis has an appearance of stomach contents. There has been no fever. Pertinent negatives include no chills, diarrhea or fever. Risk factors: pregnancy. She has tried bed rest, increased fluids and sleep (zofran (had some at home)) for the symptoms. The treatment provided no relief.    OB History    Gravida  1   Para      Term      Preterm      AB      Living        SAB      TAB      Ectopic      Multiple      Live Births              Past Medical History:  Diagnosis Date  . ADHD (attention deficit hyperactivity disorder)   . Anesthesia complication    woke up fighting after tonsillectomy  . Anxiety   . Cholecystitis   . Depression   . Dysmenorrhea   . GERD (gastroesophageal reflux disease)   . Hyperlipidemia   . Hypoglycemia   . Obesity   . Syncope   . Viral warts    hand  . Vision abnormalities    wears glasses    Past Surgical History:  Procedure Laterality Date  . CHOLECYSTECTOMY  03/21/2011   Procedure: LAPAROSCOPIC CHOLECYSTECTOMY;  Surgeon: Harl Bowie, MD;  Location: [place];  Service: General;  Laterality: N/A;  . ESOPHAGOGASTRODUODENOSCOPY  09/26/2011   Procedure: ESOPHAGOGASTRODUODENOSCOPY (EGD);  Surgeon: Oletha Blend, MD;  Location: [place];  Service: Gastroenterology;  Laterality: N/A;  . TONSILLECTOMY AND ADENOIDECTOMY  06/2005  . WISDOM TOOTH EXTRACTION      Family History  Problem Relation Age of Onset  .  Anesthesia problems Maternal Grandfather   . Heart disease Maternal Grandfather   . Nephrolithiasis Maternal Grandfather   . Diabetes Maternal Grandfather   . Mental illness Maternal Grandfather   . Cholelithiasis Mother   . Nephrolithiasis Mother   . Depression Mother   . Hypertension Mother   . Miscarriages / [place] Mother   . Anxiety disorder Mother   . Gout Father   . Nephrolithiasis Maternal Grandmother   . COPD Maternal Grandmother   . Heart disease Paternal Grandfather   . Cholelithiasis Maternal Aunt   . Depression Maternal Aunt   . Learning disabilities Maternal Aunt   . Bipolar disorder Sister     Social History   Tobacco Use  . Smoking status: Current Some Day Smoker  . Smokeless tobacco: Never Used  Substance Use Topics  . Alcohol use: No  . Drug use: Not Currently    Types: Marijuana    Allergies:  Allergies  Allergen Reactions  . Blueberry Fruit Extract Anaphylaxis  . Contrast Media [Iodinated Diagnostic Agents] Anaphylaxis and Rash  . Codeine Hives, Itching and Nausea And Vomiting  . Omnipaque [Iohexol]  Itching, Nausea And Vomiting and Swelling    Medications Prior to Admission  Medication Sig Dispense Refill Last Dose  . acetaminophen (TYLENOL) 500 MG tablet Take 1,000 mg by mouth every 6 (six) hours as needed for moderate pain.     . Prenatal Vit-Fe Fumarate-FA (PRENATAL VITAMIN) 27-0.8 MG TABS Take 1 tablet by mouth daily. 30 tablet 11   . terconazole (TERAZOL 3) 0.8 % vaginal cream Place 1 applicator vaginally at bedtime. 20 g 0     Review of Systems  Constitutional: Negative for chills and fever.  Gastrointestinal: Positive for nausea and vomiting. Negative for constipation and diarrhea.  Genitourinary: Negative for dysuria, frequency, hematuria, pelvic pain, vaginal bleeding and vaginal discharge.   Physical Exam   Blood pressure (!) 131/56, pulse 100, temperature 98.5 F (36.9 C), temperature source Oral, resp. rate 18, height 5\' 3"   (1.6 m), weight 64.8 kg, last menstrual period 10/14/2018, SpO2 100 %.  Physical Exam  Nursing note and vitals reviewed. Constitutional: She is oriented to person, place, and time. She appears well-developed and well-nourished. No distress.  HENT:  Head: Normocephalic.  Cardiovascular: Normal rate.  Respiratory: Effort normal.  GI: Soft. There is no abdominal tenderness. There is no rebound.  Neurological: She is alert and oriented to person, place, and time.  Skin: Skin is warm and dry.  Psychiatric: She has a normal mood and affect.     Results for orders placed or performed during the hospital encounter of 12/03/18 (from the past 24 hour(s))  Urinalysis, Routine w reflex microscopic     Status: Abnormal   Collection Time: 12/03/18 12:45 PM  Result Value Ref Range   Color, Urine AMBER (A) YELLOW   APPearance HAZY (A) CLEAR   Specific Gravity, Urine 1.031 (H) 1.005 - 1.030   pH 5.0 5.0 - 8.0   Glucose, UA NEGATIVE NEGATIVE mg/dL   Hgb urine dipstick NEGATIVE NEGATIVE   Bilirubin Urine NEGATIVE NEGATIVE   Ketones, ur 80 (A) NEGATIVE mg/dL   Protein, ur 30 (A) NEGATIVE mg/dL   Nitrite NEGATIVE NEGATIVE   Leukocytes,Ua NEGATIVE NEGATIVE   RBC / HPF 0-5 0 - 5 RBC/hpf   WBC, UA 6-10 0 - 5 WBC/hpf   Bacteria, UA MANY (A) NONE SEEN   Squamous Epithelial / LPF 6-10 0 - 5   Mucus PRESENT     MAU Course  Procedures  MDM Patient has had 2L of LR and 25mg  phenergan. She reports that she is feeling better. She is tolerating PO.   Assessment and Plan   1. Nausea/vomiting in pregnancy   2. [redacted] weeks gestation of pregnancy   3. Dehydration    DC home Comfort measures reviewed  1stTrimester precautions  RX: phenergan PRN #[id number]  Return to MAU as needed FU with OB as planned  Follow-up Information    Department, [facility] Follow up.   Contact information: [address] [place] [address] [place] [phone]          [id number] DNP, CNM  12/03/18  4:31  PM

## 2018-12-07 ENCOUNTER — Ambulatory Visit: Payer: Self-pay | Admitting: Clinical

## 2018-12-07 ENCOUNTER — Other Ambulatory Visit: Payer: Self-pay

## 2018-12-07 DIAGNOSIS — Z5329 Procedure and treatment not carried out because of patient's decision for other reasons: Secondary | ICD-10-CM

## 2018-12-07 DIAGNOSIS — Z91199 Patient's noncompliance with other medical treatment and regimen due to unspecified reason: Secondary | ICD-10-CM

## 2018-12-07 NOTE — BH Specialist Note (Signed)
Pt did not arrive to video visit and did not answer the phone; Left HIPPA-compliant message to call back Roselyn Reef from [facility] at [phone], and left MyChart message for patient.   [place] via Telemedicine Video Visit  12/07/2018 FREIDA NEBEL [id number]  Garlan Fair

## 2018-12-16 ENCOUNTER — Other Ambulatory Visit: Payer: Self-pay

## 2018-12-16 ENCOUNTER — Ambulatory Visit (INDEPENDENT_AMBULATORY_CARE_PROVIDER_SITE_OTHER): Payer: Self-pay | Admitting: *Deleted

## 2018-12-16 DIAGNOSIS — O0990 Supervision of high risk pregnancy, unspecified, unspecified trimester: Secondary | ICD-10-CM | POA: Insufficient documentation

## 2018-12-16 DIAGNOSIS — K5909 Other constipation: Secondary | ICD-10-CM

## 2018-12-16 NOTE — Progress Notes (Signed)
I connected with  [name] on 12/16/18 at  2:30 PM EST by telephone and verified that I am speaking with the correct person using two identifiers.   I discussed the limitations, risks, security and privacy concerns of performing an evaluation and management service by telephone and the availability of in person appointments. I also discussed with the patient that there may be a patient responsible charge related to this service. The patient expressed understanding and agreed to proceed. Explained I am completing her New OB Intake today. We discussed Her EDD and that it is based on  Early [place] . I reviewed her allergies, meds, OB History, Medical /Surgical history, and appropriate screenings. I explained I will send her the [employer] app- app sent to her while on phone.  I explained we would like her to take her blood pressure weekly and once her medicaid is active ( changing from Family Planning to pregnancy medicaid) we will send a blood pressure cuff to [facility] that will fill that prescription  Explained  then we will have her take her blood pressure weekly and enter into the app. Explained she will have some visits in office and some virtually. She already has MyChart but forgot her log in information. I assisted her with her login and resetting her password. She will download the MyChart  App after our phone call as she can't do it while we are on the phone. I reviewed her  appointment date/ time with her , our location and to wear mask, no visitors. Explained she will have exam, ob bloodwork, hemoglobin a1C, cbg , genetic testing if desired, pap if needed. I scheduled an [place] at 19 weeks and gave her the appointment. She also c/o constipation which she has had for several years. She states the MAU gave her a list of meds she can take but she has only taken colace  A few times and miralax a few times. I instructed her per protocol to take Colace 1 BID and metamucil or fibercon daily to prevent  constipation and then after several days this isn't helping; she may take Miralax only as needed. I asked her to call us if this isn't helping. She voices understanding.   Noel Rodier,RN 12/16/2018  2:30 PM

## 2018-12-16 NOTE — Patient Instructions (Signed)

## 2018-12-21 NOTE — Progress Notes (Signed)
Patient seen and assessed by nursing staff during this encounter. I have reviewed the chart and agree with the documentation and plan.  Emilina Smarr, MD 12/21/2018 12:19 AM    

## 2019-01-05 ENCOUNTER — Encounter: Payer: Medicaid Other | Admitting: Obstetrics and Gynecology

## 2019-01-05 NOTE — BH Specialist Note (Addendum)
Pt did not arrive to video visit and did not answer the phone ; Left HIPPA-compliant message to call back Roselyn Reef from [facility] at [phone].  ; left MyChart message for patient.   [place] via Telemedicine Video Visit  01/05/2019 [name] [id number]  Garlan Fair

## 2019-01-06 ENCOUNTER — Ambulatory Visit: Payer: Medicaid Other | Admitting: Clinical

## 2019-01-06 DIAGNOSIS — Z91199 Patient's noncompliance with other medical treatment and regimen due to unspecified reason: Secondary | ICD-10-CM

## 2019-01-06 DIAGNOSIS — Z5329 Procedure and treatment not carried out because of patient's decision for other reasons: Secondary | ICD-10-CM

## 2019-01-24 ENCOUNTER — Encounter: Payer: Self-pay | Admitting: Family Medicine

## 2019-01-24 ENCOUNTER — Encounter: Payer: Medicaid Other | Admitting: Family Medicine

## 2019-01-24 NOTE — Progress Notes (Signed)
Patient did not keep appointment today. She will be called to reschedule.  

## 2019-01-31 ENCOUNTER — Encounter: Payer: Self-pay | Admitting: Family Medicine

## 2019-01-31 ENCOUNTER — Encounter: Payer: Self-pay | Admitting: Nurse Practitioner

## 2019-01-31 ENCOUNTER — Ambulatory Visit: Payer: Medicaid Other | Admitting: Nurse Practitioner

## 2019-02-08 ENCOUNTER — Encounter: Payer: Self-pay | Admitting: Nurse Practitioner

## 2019-02-08 ENCOUNTER — Ambulatory Visit (INDEPENDENT_AMBULATORY_CARE_PROVIDER_SITE_OTHER): Payer: Self-pay

## 2019-02-08 ENCOUNTER — Other Ambulatory Visit: Payer: Self-pay

## 2019-02-08 ENCOUNTER — Other Ambulatory Visit (HOSPITAL_COMMUNITY)
Admission: RE | Admit: 2019-02-08 | Discharge: 2019-02-08 | Disposition: A | Discharge status: Home / Self Care | Payer: Medicaid Other | Source: Ambulatory Visit | Attending: Nurse Practitioner | Admitting: Nurse Practitioner

## 2019-02-08 VITALS — BP 121/82 | HR 95 | Wt 143.0 lb

## 2019-02-08 DIAGNOSIS — N898 Other specified noninflammatory disorders of vagina: Secondary | ICD-10-CM | POA: Insufficient documentation

## 2019-02-08 NOTE — Progress Notes (Signed)
Pt here today following TAB on 12/27/18 with complaint of vaginal discharge. She denies any abdominal pain or bleeding at this time. She states she has a yellow, brown vaginal discharge with a foul odor. Reviewed pt's hx with Nolene Bernheim, NP who states this is most likely a vaginal infection and not related to TAB. Terri, NP recommends a self-swab and treatment to follow based on results. Explained provider's recommendation to pt; self swab instructions given and specimen obtained.   Per chart review patient has missed multiple appointments with [facility]. Pt states she would like to make another appt with [facility]. Front office to schedule appt before pt leaves the office.   Discussed with patient if she is using birth control at this time. Pt was last seen in our office on 11/16/18 by Alysia Penna, MD. Patient states she was prescribed OCPs at that time and plans to begin taking. Will follow-up with our office as needed.   Fleet Contras RN 02/08/19   Chart reviewed for nurse visit. Agree with plan of care.   Currie Paris, NP 02/08/2019 5:21 PM

## 2019-02-09 LAB — CERVICOVAGINAL ANCILLARY ONLY
Bacterial Vaginitis (gardnerella): POSITIVE — AB
Candida Glabrata: NEGATIVE
Candida Vaginitis: NEGATIVE
Chlamydia: NEGATIVE
Comment: NEGATIVE
Comment: NEGATIVE
Comment: NEGATIVE
Comment: NEGATIVE
Comment: NEGATIVE
Comment: NORMAL
Neisseria Gonorrhea: NEGATIVE
Trichomonas: NEGATIVE

## 2019-02-09 MED ORDER — NITROFURANTOIN MONOHYD MACRO 100 MG PO CAPS: 100.0000 mg | ORAL_CAPSULE | Freq: Two times a day (BID) | ORAL | 0 refills | Status: DC

## 2019-02-09 NOTE — Addendum Note (Signed)
Addended by: Currie Paris on: 02/09/2019 09:22 PM   Modules accepted: Orders

## 2019-02-14 NOTE — BH Specialist Note (Signed)
Pt did not arrive to video visit and did not answer the phone ; Left HIPPA-compliant message to call back Asher Muir from [facility] at [phone].  ; left MyChart message for patient.    [facility] via Telemedicine Video Visit  02/14/2019 KIEU QUIGGLE [phone]  Rae Lips

## 2019-02-16 ENCOUNTER — Other Ambulatory Visit: Payer: Self-pay

## 2019-02-16 ENCOUNTER — Ambulatory Visit: Payer: Self-pay | Admitting: Clinical

## 2019-02-16 DIAGNOSIS — Z5329 Procedure and treatment not carried out because of patient's decision for other reasons: Secondary | ICD-10-CM

## 2019-02-16 DIAGNOSIS — Z91199 Patient's noncompliance with other medical treatment and regimen due to unspecified reason: Secondary | ICD-10-CM

## 2019-03-04 ENCOUNTER — Other Ambulatory Visit (HOSPITAL_COMMUNITY): Payer: Medicaid Other

## 2019-04-06 ENCOUNTER — Ambulatory Visit: Payer: Medicaid Other | Admitting: Student

## 2019-04-26 ENCOUNTER — Ambulatory Visit: Payer: Medicaid Other

## 2019-05-02 ENCOUNTER — Ambulatory Visit: Payer: Medicaid Other

## 2019-05-03 ENCOUNTER — Encounter: Payer: Self-pay | Admitting: General Practice

## 2019-06-08 ENCOUNTER — Ambulatory Visit: Payer: Medicaid Other

## 2019-12-16 ENCOUNTER — Encounter (HOSPITAL_COMMUNITY): Payer: Self-pay | Admitting: Emergency Medicine

## 2019-12-16 ENCOUNTER — Emergency Department (HOSPITAL_COMMUNITY)
Admission: EM | Admit: 2019-12-16 | Discharge: 2019-12-18 | Disposition: A | Discharge status: Home / Self Care | Payer: Medicaid Other | Attending: Emergency Medicine | Admitting: Emergency Medicine

## 2019-12-16 DIAGNOSIS — F17210 Nicotine dependence, cigarettes, uncomplicated: Secondary | ICD-10-CM | POA: Insufficient documentation

## 2019-12-16 DIAGNOSIS — Z20822 Contact with and (suspected) exposure to covid-19: Secondary | ICD-10-CM | POA: Insufficient documentation

## 2019-12-16 DIAGNOSIS — F331 Major depressive disorder, recurrent, moderate: Secondary | ICD-10-CM | POA: Insufficient documentation

## 2019-12-16 DIAGNOSIS — F411 Generalized anxiety disorder: Secondary | ICD-10-CM | POA: Insufficient documentation

## 2019-12-16 DIAGNOSIS — F32A Depression, unspecified: Secondary | ICD-10-CM

## 2019-12-16 DIAGNOSIS — F1994 Other psychoactive substance use, unspecified with psychoactive substance-induced mood disorder: Secondary | ICD-10-CM | POA: Diagnosis present

## 2019-12-16 DIAGNOSIS — F332 Major depressive disorder, recurrent severe without psychotic features: Secondary | ICD-10-CM | POA: Diagnosis present

## 2019-12-16 DIAGNOSIS — F1920 Other psychoactive substance dependence, uncomplicated: Secondary | ICD-10-CM | POA: Insufficient documentation

## 2019-12-16 DIAGNOSIS — R45851 Suicidal ideations: Secondary | ICD-10-CM | POA: Insufficient documentation

## 2019-12-16 LAB — COMPREHENSIVE METABOLIC PANEL
ALT: 37 U/L (ref 0–44)
AST: 45 U/L — ABNORMAL HIGH (ref 15–41)
Albumin: 3.9 g/dL (ref 3.5–5.0)
Alkaline Phosphatase: 56 U/L (ref 38–126)
Anion gap: 8 (ref 5–15)
BUN: 10 mg/dL (ref 6–20)
CO2: 22 mmol/L (ref 22–32)
Calcium: 8.6 mg/dL — ABNORMAL LOW (ref 8.9–10.3)
Chloride: 107 mmol/L (ref 98–111)
Creatinine, Ser: 0.53 mg/dL (ref 0.44–1.00)
GFR, Estimated: >60 mL/min (ref >60)
Glucose, Bld: 97 mg/dL (ref 70–99)
Potassium: 4.1 mmol/L (ref 3.5–5.1)
Sodium: 137 mmol/L (ref 135–145)
Total Bilirubin: 0.6 mg/dL (ref 0.3–1.2)
Total Protein: 5.8 g/dL — ABNORMAL LOW (ref 6.5–8.1)

## 2019-12-16 LAB — CBC WITH DIFFERENTIAL/PLATELET
Abs Immature Granulocytes: 0.01 10*3/uL (ref 0.00–0.07)
Basophils Absolute: 0 10*3/uL (ref 0.0–0.1)
Basophils Relative: 1 %
Eosinophils Absolute: 0.3 10*3/uL (ref 0.0–0.5)
Eosinophils Relative: 7 %
HCT: 36.6 % (ref 36.0–46.0)
Hemoglobin: 11.6 g/dL — ABNORMAL LOW (ref 12.0–15.0)
Immature Granulocytes: 0 %
Lymphocytes Relative: 24 %
Lymphs Abs: 1.1 10*3/uL (ref 0.7–4.0)
MCH: 27.4 pg (ref 26.0–34.0)
MCHC: 31.7 g/dL (ref 30.0–36.0)
MCV: 86.5 fL (ref 80.0–100.0)
Monocytes Absolute: 0.4 10*3/uL (ref 0.1–1.0)
Monocytes Relative: 8 %
Neutro Abs: 2.7 10*3/uL (ref 1.7–7.7)
Neutrophils Relative %: 60 %
Platelets: 164 10*3/uL (ref 150–400)
RBC: 4.23 MIL/uL (ref 3.87–5.11)
RDW: 13.2 % (ref 11.5–15.5)
WBC: 4.5 10*3/uL (ref 4.0–10.5)
nRBC: 0 % (ref 0.0–0.2)

## 2019-12-16 LAB — RESP PANEL BY RT-PCR (FLU A&B, COVID) ARPGX2
Influenza A by PCR: NEGATIVE
Influenza B by PCR: NEGATIVE
SARS Coronavirus 2 by RT PCR: NEGATIVE

## 2019-12-16 LAB — ETHANOL: Alcohol, Ethyl (B): <10 mg/dL (ref <10)

## 2019-12-16 LAB — I-STAT BETA HCG BLOOD, ED (MC, WL, AP ONLY): I-stat hCG, quantitative: <5 m[IU]/mL (ref <5)

## 2019-12-16 MED ORDER — ACETAMINOPHEN 325 MG PO TABS: 650.0000 mg | ORAL_TABLET | ORAL | Status: DC | PRN

## 2019-12-16 MED ORDER — CLONIDINE HCL 0.1 MG PO TABS: 0.1000 mg | ORAL_TABLET | Freq: Two times a day (BID) | ORAL | Status: DC

## 2019-12-16 MED ORDER — DICYCLOMINE HCL 20 MG PO TABS: 20.0000 mg | ORAL_TABLET | Freq: Four times a day (QID) | ORAL | Status: DC | PRN

## 2019-12-16 MED ORDER — METHOCARBAMOL 500 MG PO TABS
500.0000 mg | ORAL_TABLET | Freq: Three times a day (TID) | ORAL | Status: DC | PRN
  Administered 2019-12-17 – 2019-12-18 (×2): 500 mg via ORAL
  Filled 2019-12-16 (×2): qty 1

## 2019-12-16 MED ORDER — ZOLPIDEM TARTRATE 5 MG PO TABS: 5.0000 mg | ORAL_TABLET | Freq: Every evening | ORAL | Status: DC | PRN

## 2019-12-16 MED ORDER — ONDANSETRON HCL 4 MG PO TABS: 4.0000 mg | ORAL_TABLET | Freq: Three times a day (TID) | ORAL | Status: DC | PRN

## 2019-12-16 MED ORDER — NAPROXEN 500 MG PO TABS: 500.0000 mg | ORAL_TABLET | Freq: Two times a day (BID) | ORAL | Status: DC | PRN

## 2019-12-16 MED ORDER — CLONIDINE HCL 0.1 MG PO TABS: 0.1000 mg | ORAL_TABLET | Freq: Every day | ORAL | Status: DC

## 2019-12-16 MED ORDER — LOPERAMIDE HCL 2 MG PO CAPS: 2.0000 mg | ORAL_CAPSULE | ORAL | Status: DC | PRN

## 2019-12-16 MED ORDER — ONDANSETRON 4 MG PO TBDP: 4.0000 mg | ORAL_TABLET | Freq: Four times a day (QID) | ORAL | Status: DC | PRN

## 2019-12-16 MED ORDER — HYDROXYZINE HCL 25 MG PO TABS: 25.0000 mg | ORAL_TABLET | Freq: Four times a day (QID) | ORAL | Status: DC | PRN

## 2019-12-16 MED ORDER — CLONIDINE HCL 0.1 MG PO TABS
0.1000 mg | ORAL_TABLET | Freq: Four times a day (QID) | ORAL | Status: DC
  Administered 2019-12-17 – 2019-12-18 (×3): 0.1 mg via ORAL
  Filled 2019-12-16 (×3): qty 1

## 2019-12-16 NOTE — ED Notes (Addendum)
Attempted to obtain blood work from pt and pt refused labs stating " I am fine, I don't need my blood drawn." Triage RN made aware

## 2019-12-16 NOTE — ED Notes (Signed)
pts mother requesting to speak with tts [name] [place] [phone]

## 2019-12-16 NOTE — ED Provider Notes (Signed)
[name]-EMERGENCY DEPT Provider Note   CSN: [id number] Arrival date & time: 12/16/19  1904     History Chief Complaint  Patient presents with  . IVC  . Medical Clearance    [name] is a 24 y.o. female.  HPI    Patient with a history of depression, reported polysubstance abuse comes in with police department with IVC completed by her mother.  Patient is tearful and is unsure why she is in the hospital.  She reports that she was laying in the couch at her grandmothers when she was picked up by the PD.  She states that she normally lives at a motel but was unable to pay the rent and had to leave earlier today.  She denies any SI.  She denies any substance abuse.  According to the IVC paperwork, patient has made verbal threats of wanting to harm herself.  She also was kicked out of the motel because of threatening and aggressive behavior towards the staff over there.  I spoke with patient's mother, who filled out the IVC paperwork.  She reports that patient might have broken up with her boyfriend recently and that might have triggered her downfall.  She is concerned about her wellbeing.  She reports that she suspects that patient had contacted someone for delivery of drugs to their home LATER this evening.  Past Medical History:  Diagnosis Date  . ADHD (attention deficit hyperactivity disorder)   . Anesthesia complication    woke up fighting after tonsillectomy  . Anxiety   . Cholecystitis   . Complication of anesthesia    when wakes up " freaks out- like panic attack"  . Depression   . Dysmenorrhea   . GERD (gastroesophageal reflux disease)   . Hyperlipidemia   . Hypoglycemia   . Obesity   . Syncope   . Viral warts    hand  . Vision abnormalities    wears glasses    Patient Active Problem List   Diagnosis Date Noted  . Contraceptive management 11/16/2018  . Abdominal pain 04/01/2018  . Hypokalemia 04/01/2018  . Hypocalcemia 04/01/2018    . Hypoglycemia 04/01/2018  . Polysubstance (excluding opioids) dependence (HCC) 04/11/2017  . MDD (major depressive disorder), recurrent severe, 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  . Post-operative pain 08/26/2014  . Pain, dental   . Cellulitis and abscess of oral soft tissues   . Substance induced mood disorder (HCC) 03/15/2012  . GAD (generalized anxiety disorder) 03/15/2012  . 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 ADHD (attention deficit hyperactivity disorder), combined type 03/15/2012  . Headache(784.0) 09/18/2011  . Palpitations 09/18/2011  . Rapid weight loss 09/18/2011  . Nausea & vomiting 09/17/2011  . Epigastric abdominal pain 09/17/2011  . Pilonidal abscess 04/20/2011  . Chronic constipation 04/20/2011  . Allergy history, radiographic dye 04/20/2011  . Status post laparoscopic cholecystectomy 03/10/2011    Past Surgical History:  Procedure Laterality Date  . CHOLECYSTECTOMY  03/21/2011   Procedure: LAPAROSCOPIC CHOLECYSTECTOMY;  Surgeon: Shelly Rubenstein, MD;  Location: [facility];  Service: General;  Laterality: N/A;  . ESOPHAGOGASTRODUODENOSCOPY  09/26/2011   Procedure: ESOPHAGOGASTRODUODENOSCOPY (EGD);  Surgeon: Jon Gills, MD;  Location: [facility] OR;  Service: Gastroenterology;  Laterality: N/A;  . TONSILLECTOMY AND ADENOIDECTOMY  06/2005  . WISDOM TOOTH EXTRACTION       OB History    Gravida  1   Para      Term      Preterm  AB      Living        SAB      TAB      Ectopic      Multiple      Live Births              Family History  Problem Relation Age of Onset  . Anesthesia problems Maternal Grandfather   . Heart disease Maternal Grandfather   . Nephrolithiasis Maternal Grandfather   . Diabetes Maternal Grandfather   . Mental illness Maternal Grandfather   . Cholelithiasis Mother   . Nephrolithiasis Mother   . Depression Mother   . Hypertension Mother   . Miscarriages / [place] Mother   . Anxiety disorder Mother   . Gout Father    . Nephrolithiasis Maternal Grandmother   . COPD Maternal Grandmother   . Heart disease Paternal Grandfather   . Cholelithiasis Maternal Aunt   . Depression Maternal Aunt   . Learning disabilities Maternal Aunt   . Bipolar disorder Sister     Social History   Tobacco Use  . Smoking status: Current Every Day Smoker    Packs/day: 0.50    Years: 4.00    Pack years: 2.00    Types: Cigarettes  . Smokeless tobacco: Never Used  . Tobacco comment: is cutting back ; trying to quit   Vaping Use  . Vaping Use: Never used  Substance Use Topics  . Alcohol use: Not Currently    Comment: socially  . Drug use: Not Currently    Types: Marijuana    Comment: last used 2014    Home Medications Prior to Admission medications   Medication Sig Start Date End Date Taking? Authorizing Provider  acetaminophen (TYLENOL) 500 MG tablet Take 1,000 mg by mouth every 6 (six) hours as needed for moderate pain.    [provider]  docusate sodium (COLACE) 100 MG capsule Take 100 mg by mouth daily as needed for mild constipation.    [provider]  nitrofurantoin, macrocrystal-monohydrate, (MACROBID) 100 MG capsule Take 1 capsule (100 mg total) by mouth 2 (two) times daily. 02/09/19   Burleson, Brand Males, NP  promethazine (PHENERGAN) 25 MG tablet Take 0.5-1 tablets (12.5-25 mg total) by mouth every 6 (six) hours as needed for nausea or vomiting. 12/03/18   Armando Reichert, CNM    Allergies    Blueberry fruit extract, Contrast media [iodinated diagnostic agents], Codeine, and Omnipaque [iohexol]  Review of Systems   Review of Systems  Constitutional: Positive for activity change.  Respiratory: Negative for shortness of breath.   Cardiovascular: Negative for chest pain.  Psychiatric/Behavioral: Positive for behavioral problems and suicidal ideas.  All other systems reviewed and are negative.   Physical Exam Updated Vital Signs BP 123/80 (BP Location: Right Arm)   Pulse (!) 109    Temp 98 F (36.7 C)   Resp 19   SpO2 100%   Physical Exam Vitals and nursing note reviewed.  Constitutional:      Appearance: She is well-developed.  HENT:     Head: Normocephalic and atraumatic.  Eyes:     Pupils: Pupils are equal, round, and reactive to light.  Cardiovascular:     Rate and Rhythm: Normal rate and regular rhythm.     Heart sounds: Normal heart sounds. No murmur heard.   Pulmonary:     Effort: Pulmonary effort is normal. No respiratory distress.  Abdominal:     General: There is no distension.  Palpations: Abdomen is soft.     Tenderness: There is no abdominal tenderness. There is no guarding or rebound.  Musculoskeletal:     Cervical back: Neck supple.  Skin:    General: Skin is warm and dry.  Neurological:     Mental Status: She is alert and oriented to person, place, and time.  Psychiatric:     Comments: Tearful, and labile     ED Results / Procedures / Treatments   Labs (all labs ordered are listed, but only abnormal results are displayed) Labs Reviewed  COMPREHENSIVE METABOLIC PANEL - Abnormal; Notable for the following components:      Result Value   Calcium 8.6 (*)    Total Protein 5.8 (*)    AST 45 (*)    All other components within normal limits  CBC WITH DIFFERENTIAL/PLATELET - Abnormal; Notable for the following components:   Hemoglobin 11.6 (*)    All other components within normal limits  RESP PANEL BY RT-PCR (FLU A&B, COVID) ARPGX2  ETHANOL  RAPID URINE DRUG SCREEN, HOSP PERFORMED  I-STAT BETA HCG BLOOD, ED ([facility], [facility], [facility] ONLY)    EKG None  Radiology No results found.  Procedures Procedures (including critical care time)  Medications Ordered in ED Medications  cloNIDine (CATAPRES) tablet 0.1 mg (0.1 mg Oral Refused 12/16/19 2207)    Followed by  cloNIDine (CATAPRES) tablet 0.1 mg (has no administration in time range)    Followed by  cloNIDine (CATAPRES) tablet 0.1 mg (has no administration in time range)  dicyclomine  (BENTYL) tablet 20 mg (has no administration in time range)  hydrOXYzine (ATARAX/VISTARIL) tablet 25 mg (has no administration in time range)  loperamide (IMODIUM) capsule 2-4 mg (has no administration in time range)  methocarbamol (ROBAXIN) tablet 500 mg (has no administration in time range)  naproxen (NAPROSYN) tablet 500 mg (has no administration in time range)  ondansetron (ZOFRAN-ODT) disintegrating tablet 4 mg (has no administration in time range)  acetaminophen (TYLENOL) tablet 650 mg (has no administration in time range)  zolpidem (AMBIEN) tablet 5 mg (has no administration in time range)  ondansetron (ZOFRAN) tablet 4 mg (has no administration in time range)    ED Course  I have reviewed the triage vital signs and the nursing notes.  Pertinent labs & imaging results that were available during my care of the patient were reviewed by me and considered in my medical decision making (see chart for details).    MDM Rules/Calculators/A&P                          Patient brought in for suicidal ideation.  Patient is not providing meaningful history besides stating that she does not want to hurt herself.  Family contacted to get their side.  For now, I think she will need psych for clearance.  Pt denies nausea, emesis, fevers, chills, chest pains, shortness of breath, headaches, abdominal pain, uti like symptoms. Medically cleared.   Final Clinical Impression(s) / ED Diagnoses Final diagnoses:  Suicidal ideation  Depression, unspecified depression type    Rx / DC Orders ED Discharge Orders    None       Derwood Kaplan, MD 12/16/19 2322

## 2019-12-16 NOTE — ED Triage Notes (Signed)
Patient here via [facility] IVC'd reporting danger to self and others. Xanax and heroin addiction. Wandering streets, sitting on steps confused, threatening and cursing at staff at hotel where shes staying. Denies SI/HI.

## 2019-12-16 NOTE — ED Notes (Signed)
Pt's grandmother, Laurence Aly, is requesting to speak with counselor. [phone]

## 2019-12-17 LAB — RAPID URINE DRUG SCREEN, HOSP PERFORMED
Amphetamines: NOT DETECTED
Barbiturates: NOT DETECTED
Benzodiazepines: POSITIVE — AB
Cocaine: NOT DETECTED
Opiates: POSITIVE — AB
Tetrahydrocannabinol: NOT DETECTED

## 2019-12-17 MED ORDER — GABAPENTIN 300 MG PO CAPS
300.0000 mg | ORAL_CAPSULE | Freq: Two times a day (BID) | ORAL | Status: DC
  Administered 2019-12-17 – 2019-12-18 (×2): 300 mg via ORAL
  Filled 2019-12-17 (×2): qty 1

## 2019-12-17 MED ORDER — RISPERIDONE 1 MG PO TABS
1.0000 mg | ORAL_TABLET | Freq: Two times a day (BID) | ORAL | Status: DC
  Administered 2019-12-17 – 2019-12-18 (×3): 1 mg via ORAL
  Filled 2019-12-17 (×3): qty 1

## 2019-12-17 NOTE — ED Notes (Addendum)
Pt mother would like to speak to psychiatrist when rounds ar done in the morning.[name] [phone]

## 2019-12-17 NOTE — BH Assessment (Signed)
Tele Assessment Note   Patient Name: [name] MRN: [id number] Referring Physician: Dr. Derwood Kaplan Location of Patient: [place] Location of Provider: Behavioral Health TTS Department  [name] is an 24 y.o. female.  -Clinician reviewed note by Dr. Rhunette Croft.  Patient with a history of depression, reported polysubstance abuse comes in with police department with IVC completed by her mother. Patient is tearful and is unsure why she is in the hospital.  She reports that she was laying in the couch at her grandmothers when she was picked up by the PD.  She states that she normally lives at a motel but was unable to pay the rent and had to leave earlier today.  She denies any SI.  She denies any substance abuse. According to the IVC paperwork, patient has made verbal threats of wanting to harm herself.  She also was kicked out of the motel because of threatening and aggressive behavior towards the staff over there.  I spoke with patient's mother, who filled out the IVC paperwork.  She reports that patient might have broken up with her boyfriend recently and that might have triggered her downfall.  She is concerned about her wellbeing.  She reports that she suspects that patient had contacted someone for delivery of drugs to their home LATER this evening  When this clinician talked to patient she was waking up from having slept.  She was cooperative during assessment.  She reiterated that she did not understand why she was taken to [facility] from her grandmother's home.  Pt admits that she had some "trouble" at the hotel where she was staying.  She says "my mom knows how it is over there."  Patient denies wanting to harm or kill anyone however.    Patient is denying any SI, no intention or plan.  Patient has had a previous suicide attempt.  She denies telling anyone that she was feeling suicidal.    Pt says that she has not been drinking or smoking marijuana.  She has had some use of heroin in  the past.  Pt is taking 1 tab (8mg ) of suboxone off the street daily.  Patient says her stressors are that she has some legal issues (trafficing cocaine charge).  Clinician asked if she had broken up w/ a boyfriend and she said they were not broken up.  When asked if she panhandled for money she does not answer, pt is unemployed.  Patient has good eye contact and is oriented x3.  She is not responding to internal stimuli nor is she displaying delusional behavior.  Pt thought process is clear and logical.  She reports weight loss of 20 lbs in the last 2 months however.  Sleep is WNL.  Patient has been at [facility] in 03/2017 and 3x in 2014.  She was at [facility] once in 2014.  Patient has no current outpatient provider.  -Clinician unable to contact provider.  Called over to [facility] and unable to get in touch with [facility] at [facility].  Pt will be reviewed by psychiatry since she is on IVC.  Diagnosis: MDD recurrent, moderate; polysubstance dependence; GAD  Past Medical History:  Past Medical History:  Diagnosis Date  . ADHD (attention deficit hyperactivity disorder)   . Anesthesia complication    woke up fighting after tonsillectomy  . Anxiety   . Cholecystitis   . Complication of anesthesia    when wakes up " freaks out- like panic attack"  . Depression   .  Dysmenorrhea   . GERD (gastroesophageal reflux disease)   . Hyperlipidemia   . Hypoglycemia   . Obesity   . Syncope   . Viral warts    hand  . Vision abnormalities    wears glasses    Past Surgical History:  Procedure Laterality Date  . CHOLECYSTECTOMY  03/21/2011   Procedure: LAPAROSCOPIC CHOLECYSTECTOMY;  Surgeon: Shelly Rubenstein, MD;  Location: [facility] OR;  Service: General;  Laterality: N/A;  . ESOPHAGOGASTRODUODENOSCOPY  09/26/2011   Procedure: ESOPHAGOGASTRODUODENOSCOPY (EGD);  Surgeon: Jon Gills, MD;  Location[facility] OR;  Service: Gastroenterology;  Laterality: N/A;  . TONSILLECTOMY AND ADENOIDECTOMY  06/2005  . WISDOM TOOTH  EXTRACTION      Family History:  Family History  Problem Relation Age of Onset  . Anesthesia problems Maternal Grandfather   . Heart disease Maternal Grandfather   . Nephrolithiasis Maternal Grandfather   . Diabetes Maternal Grandfather   . Mental illness Maternal Grandfather   . Cholelithiasis Mother   . Nephrolithiasis Mother   . Depression Mother   . Hypertension Mother   . Miscarriages / [place] Mother   . Anxiety disorder Mother   . Gout Father   . Nephrolithiasis Maternal Grandmother   . COPD Maternal Grandmother   . Heart disease Paternal Grandfather   . Cholelithiasis Maternal Aunt   . Depression Maternal Aunt   . Learning disabilities Maternal Aunt   . Bipolar disorder Sister     Social History:  reports that she has been smoking cigarettes. She has a 2.00 pack-year smoking history. She has never used smokeless tobacco. She reports previous alcohol use. She reports previous drug use. Drug: Marijuana.  Additional Social History:  Alcohol / Drug Use Pain Medications: No medications Prescriptions: None Over the Counter: No medications History of alcohol / drug use?: Yes  CIWA: CIWA-Ar BP: 124/82 Pulse Rate: 99 COWS:    Allergies:  Allergies  Allergen Reactions  . Blueberry Fruit Extract Anaphylaxis  . Contrast Media [Iodinated Diagnostic Agents] Anaphylaxis and Rash  . Codeine Hives, Itching and Nausea And Vomiting  . Omnipaque [Iohexol] Itching, Nausea And Vomiting and Swelling    Home Medications: (Not in a hospital admission)   OB/GYN Status:  No LMP recorded.  General Assessment Data Location of Assessment: [facility] ED TTS Assessment: In system Is this a Tele or Face-to-Face Assessment?: Tele Assessment Is this an Initial Assessment or a Re-assessment for this encounter?: Initial Assessment Patient Accompanied by:: N/A Language Other than English: No Living Arrangements: Homeless/Shelter What gender do you identify as?: Female Date Telepsych  consult ordered in [facility]: 12/16/19 Time Telepsych consult ordered in [facility]: 2019 Marital status: Single Pregnancy Status: No Living Arrangements: Other (Comment) (Pt has been staying at hotels.) Can pt return to current living arrangement?: Yes Admission Status: Involuntary Petitioner: Family member Is patient capable of signing voluntary admission?: No Referral Source: Self/Family/Friend Insurance type: self pay     Crisis Care Plan Living Arrangements: Other (Comment) (Pt has been staying at hotels.) Name of Psychiatrist: None Name of Therapist: None  Education Status Is patient currently in school?: No Is the patient employed, unemployed or receiving disability?: Unemployed  Risk to self with the past 6 months Suicidal Ideation: No Has patient been a risk to self within the past 6 months prior to admission? : No Suicidal Intent: No Has patient had any suicidal intent within the past 6 months prior to admission? : No Is patient at risk for suicide?: No Suicidal Plan?: No Has  patient had any suicidal plan within the past 6 months prior to admission? : No Access to Means: No What has been your use of drugs/alcohol within the last 12 months?: Suboxone Previous Attempts/Gestures: Yes How many times?: 1 Other Self Harm Risks: SA issues Triggers for Past Attempts: Other personal contacts Intentional Self Injurious Behavior: None Family Suicide History: No Recent stressful life event(s): Conflict (Comment), Financial Problems, Legal Issues, Turmoil (Comment) (Homeless; conflicts with mother) Persecutory voices/beliefs?: No Depression: Yes Depression Symptoms: Despondent, Loss of interest in usual pleasures, Feeling worthless/self pity, Insomnia, Isolating Substance abuse history and/or treatment for substance abuse?: Yes Suicide prevention information given to non-admitted patients: Not applicable  Risk to Others within the past 6 months Homicidal Ideation: No Does patient have  any lifetime risk of violence toward others beyond the six months prior to admission? : No Thoughts of Harm to Others: No Current Homicidal Intent: No Current Homicidal Plan: No Access to Homicidal Means: No Identified Victim: No one History of harm to others?: No Assessment of Violence: None Noted Violent Behavior Description: None reported Does patient have access to weapons?: No Criminal Charges Pending?: Yes Describe Pending Criminal Charges: Trafficing cocaine Does patient have a court date: Yes Court Date: 02/20/20 Is patient on probation?: No  Psychosis Hallucinations: None noted Delusions: None noted  Mental Status Report Appearance/Hygiene: Unremarkable, In scrubs Eye Contact: Good Motor Activity: Unremarkable Speech: Logical/coherent Level of Consciousness: Drowsy Mood: Depressed, Sad Affect: Sad Anxiety Level: Moderate Thought Processes: Coherent, Relevant Judgement: Impaired Orientation: Person, Situation, Place Obsessive Compulsive Thoughts/Behaviors: None  Cognitive Functioning Concentration: Poor Memory: Recent Impaired, Remote Intact Is patient IDD: No Insight: Fair Impulse Control: Poor Appetite: Poor Have you had any weight changes? : Loss Amount of the weight change? (lbs):  (20 lbs in last 2 months) Sleep: No Change Total Hours of Sleep: 6 Vegetative Symptoms: Staying in bed  ADLScreening [facility] Assessment Services) Patient's cognitive ability adequate to safely complete daily activities?: Yes Patient able to express need for assistance with ADLs?: Yes Independently performs ADLs?: Yes (appropriate for developmental age)  Prior Inpatient Therapy Prior Inpatient Therapy: Yes Prior Therapy Dates: 03/2017; 3x in 03/2012 [facility] Reason for Treatment: SI, SA  Prior Outpatient Therapy Prior Outpatient Therapy: Yes Prior Therapy Dates: unknown Prior [facility] Reason for Treatment: Therapy Does patient have an ACCT team?: No Does patient have Intensive In-House Services?  : No Does patient have [facility] services? : No Does patient have P4CC services?: No  ADL Screening (condition at time of admission) Patient's cognitive ability adequate to safely complete daily activities?: Yes Is the patient deaf or have difficulty hearing?: No Does the patient have difficulty seeing, even when wearing glasses/contacts?: No Does the patient have difficulty concentrating, remembering, or making decisions?: No Patient able to express need for assistance with ADLs?: Yes Does the patient have difficulty dressing or bathing?: No Independently performs ADLs?: Yes (appropriate for developmental age) Does the patient have difficulty walking or climbing stairs?: No Weakness of Legs: None Weakness of Arms/Hands: None       Abuse/Neglect Assessment (Assessment to be complete while patient is alone) Abuse/Neglect Assessment Can Be Completed: Yes Physical Abuse: Denies Verbal Abuse: Denies Sexual Abuse: Denies Exploitation of patient/patient's resources: Denies Self-Neglect: Denies     Merchant navy officerAdvance Directives (For Healthcare) Does Patient Have a Medical Advance Directive?: No          Disposition:  Disposition Initial Assessment Completed for  this Encounter: Yes Patient referred to: Other (Comment) (Psychiatry to review)  This service was provided via telemedicine using a 2-way, interactive audio and video technology.  Names of all persons participating in this telemedicine service and their role in this encounter. Name: [name] Role: patient  Name: Beatriz Stallion, M.S. LCAS QP Role: clinician  Name:  Role:   Name:  Role:     Alexandria Lodge 12/17/2019 6:52 AM

## 2019-12-18 DIAGNOSIS — F1994 Other psychoactive substance use, unspecified with psychoactive substance-induced mood disorder: Secondary | ICD-10-CM

## 2019-12-18 MED ORDER — GABAPENTIN 300 MG PO CAPS
300.0000 mg | ORAL_CAPSULE | Freq: Three times a day (TID) | ORAL | Status: DC
  Administered 2019-12-18: 300 mg via ORAL
  Filled 2019-12-18: qty 1

## 2019-12-18 MED ORDER — GABAPENTIN 100 MG PO CAPS: 300.0000 mg | ORAL_CAPSULE | Freq: Three times a day (TID) | ORAL | 0 refills | Status: DC

## 2019-12-18 NOTE — ED Notes (Signed)
Lunch tray at bedside. ?

## 2019-12-18 NOTE — ED Notes (Addendum)
Call received from pt mother [name] [phone], states returning call received from [facility] NP re: pt status/updates...requesting f/u when possible. [employer]

## 2019-12-18 NOTE — [facility] Suicide Risk Assessment (Cosign Needed)
Suicide Risk Assessment  Discharge Assessment   [facility] Discharge Suicide Risk Assessment   Principal Problem: Substance induced mood disorder (HCC) Discharge Diagnoses: Principal Problem:   Substance induced mood disorder (HCC) Active Problems:   MDD (major depressive disorder), recurrent severe, without psychosis (HCC)   INTERIM PROGRESS NOTE [name], 24 y.o., female patient seen via telepsych by this provider; chart reviewed and consulted with Dr. Lucianne Muss on 12/18/19.  On evaluation [name] endorses a 2 year history for heroine use but denies suicidal thoughts,plan or intent as outlined in IVC taken out by her mother.  She also endorses recent break up with her boyfriend, but is guarded and does not provide specific details surrounding this.  States she is confused to why her mother had her hospitalized because she states she has a good relationship with her mother and grandmother and she feels they are very supportive of her. When she broke up with her boyfriend, she relates she went to her grandmother's house to rest because she "felt safe there."  She acknowledges her family has reasons to be concerned about her due to present drug use but emphatically denies intentional attempts or thoughts of self harm.  She states she been hospitalized for psychiatric concerns 3x over her lifetime, for suicidal behaviors "caused by drugs" and for substance abuse treatment in 2019 at [facility] and know how to reach for help when needed.  On admission, her UDS was positive for benzodiazepines and opiates.    She was treated placed on CIWA protocol and also treated with clonidine, gabapentin for withdrawal symptoms and anxiety; and risperidone to counteract effects of drug use and depressive symptoms.  She tolerated the medications well, no side effects. Sleep and appetite good.   During her admission, she has remained cooperative with staff and has not demonstrated behavioral or safety concerns.     Collateral information received from her mother, [name], who was the petitioner for her current IVC. She states at the time of petitioner, she was concerned with her daughter's drug use and wanted her to get help. She denies hearing her daughter voice suicidal concerns.  States she has a history for addiction as well and just wants her daughter to get help.  States she spoke wit Mallisa earlier today and, "she sounds much better today, I am not concerned about her now but know we have a long road ahead of [place]. "     Total Time spent with patient: 30 minutes  Musculoskeletal: Psychiatric Specialty Exam: @ROSBYAGE @  Blood pressure (!) 107/57, pulse 76, temperature 98.4 F (36.9 C), temperature source Oral, resp. rate 18, SpO2 100 %.There is no height or weight on file to calculate BMI.  General Appearance: Casual  Eye Contact::  Good  Speech:  Clear and Coherent and Normal Rate  Volume:  Normal  Mood:  Euthymic  Affect:  Congruent  Thought Process:  Coherent, Goal Directed and Descriptions of Associations: Intact  Orientation:  Full (Time, Place, and Person)  Thought Content:  Logical, improved since admission  Suicidal Thoughts:  No  Homicidal Thoughts:  No  Memory:  Immediate;   Good Recent;   Good Remote;   Good  Judgement:  Fair, in lieu of chronic drug abuse  Insight:  Fair  Psychomotor Activity:  Normal  Concentration:  Good  Recall:  Good  Fund of Knowledge:Good  Language: Good  Akathisia:  NA  Handed:  Right  AIMS (if indicated):     Assets:  Communication Skills Desire for Improvement Housing Social Support  Sleep:   >6 hours  Cognition: WNL  ADL's:  Intact   Mental Status Per Nursing Assessment::   On Admission:     Demographic Factors:  Low socioeconomic status  Loss Factors: Loss of significant relationship  Historical Factors: Prior suicide attempts, Family history of mental illness or substance abuse and Impulsivity  Risk Reduction Factors:    Sense of responsibility to family, Living with another person, especially a relative and Positive social support  Continued Clinical Symptoms:  Depression:   Comorbid alcohol abuse/dependence Alcohol/Substance Abuse/Dependencies  Cognitive Features That Contribute To Risk:  None    Suicide Risk:  Mild:  Suicidal ideation of limited frequency, intensity, duration, and specificity.  There are no identifiable plans, no associated intent, mild dysphoria and related symptoms, good self-control (both objective and subjective assessment), few other risk factors, and identifiable protective factors, including available and accessible social support.    Plan Of Care/Follow-up recommendations:    Disposition: No evidence of imminent risk to self or others at present.   Patient does not meet criteria for psychiatric inpatient admission. Supportive therapy provided about ongoing stressors. Refer to IOP. Discussed crisis plan, support from social network, calling 911, coming to the Emergency Department, and calling Suicide Hotline.   Recommendations: Plan- As per above assessment, there are no current grounds for involuntary commitment at this time.?   Patient is not currently interested in inpatient services, but requesting resources for outpatient mental health services.  I have asked SW to provide him with a Higher education careers adviser.  ?  Recommend SW to provide resources for patient to establish with outpatient substance abuse and medication management.  Patient would like to establish are with [facility] outpatient or [facility].  Asking SW to help with this.  Additionally, She does not have medical insurance but would like to continue taking gabapentin 300mg  po TID until she can follow up for outpatient care. Asking EDP for assistance with this matter.   Spoke with Dr. , EDP informed of above recommendation and disposition.  He agrees to rescind the IVC.        Melene Plan, NP 12/18/2019,  10:53 AM

## 2019-12-18 NOTE — Progress Notes (Signed)
..  Transition of Care [facility]) - Emergency Department Mini Assessment   Patient Details  Name: [name] MRN: [id number] Date of Birth: 1995-08-29  Transition of [facility] CM/SW Contact:    Malayasia Mirkin C Tarpley-Carter, [place] Phone Number: 12/18/2019, 2:10 PM   Clinical Narrative: TOC CM/CSW met with pt about outpt substance treatment and med.assistance.  Pt stated her mother has her an appointment to a treatment center on Monday.  Pt also accepted my resources to also seek outpt treatment.  Pt also accepted medication coupon to [facility] for prescribed meds (CSW got assistance with coupon from [facility] CM/RN-Diana).    CSW also actively listened to pt about her concerns.  CSW offered pt information concerning resource sheet.    Clementine Soulliere Tarpley-Carter, MSW, LCSW-A Pronouns:  She, Her, [place] ED Transitions of CareClinical Social Worker [email or web address] [phone]    ED Mini Assessment: What brought you to the Emergency Department? : Unsure, brought in by Grandmother.  Barriers to Discharge: No Barriers Identified     Means of departure: Car  Interventions which prevented an admission or readmission: Other (must enter comment) (Medication management and outpt substance treatment)    Patient Contact and Communications        ,              Choice offered to / list presented to : Patient  Admission diagnosis:  IVC Patient Active Problem List   Diagnosis Date Noted  . Contraceptive management 11/16/2018  . Abdominal pain 04/01/2018  . Hypokalemia 04/01/2018  . Hypocalcemia 04/01/2018  . Hypoglycemia 04/01/2018  . Polysubstance (excluding opioids) dependence ([place]) 04/11/2017  . MDD (major depressive disorder), recurrent severe, without psychosis ([place]) 04/10/2017  . Post-operative pain 08/26/2014  . Pain, dental   . Cellulitis and abscess of oral soft tissues   . Substance induced mood disorder ([place])  03/15/2012  . GAD (generalized anxiety disorder) 03/15/2012  . 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 ADHD (attention deficit hyperactivity disorder), combined type 03/15/2012  . Headache(784.0) 09/18/2011  . Palpitations 09/18/2011  . Rapid weight loss 09/18/2011  . Nausea & vomiting 09/17/2011  . Epigastric abdominal pain 09/17/2011  . Pilonidal abscess 04/20/2011  . Chronic constipation 04/20/2011  . Allergy history, radiographic dye 04/20/2011  . Status post laparoscopic cholecystectomy 03/10/2011   PCP:  Patient, No Pcp Per Pharmacy:   [id number] [place] [place] [address]Phone: [phone]: [phone] [place]: [phone]: [phone]

## 2019-12-19 ENCOUNTER — Telehealth: Payer: Self-pay

## 2019-12-19 NOTE — Telephone Encounter (Signed)
Requesting to speak with a nurse about getting an appt for OUD. Please call pt back.

## 2019-12-19 NOTE — Telephone Encounter (Signed)
Sounds great.  Thank you. 

## 2019-12-19 NOTE — Telephone Encounter (Signed)
Pt calls and states a friend told her about OUD clinic. She has been using heroin and xanax for appr 2 yrs Overdosed 1x in 2019 Went to detox in 2019 for 7 days but eventually went back to using Snorts only does not use needles Last used heroin 4 days ago Lives w/ mother and grandmother Mother is clean but was using heroin Mother and grandmother are supportive and want to help her get clean She does not have medicaid or insurance appt 12/7 at 0945 She was given directions, told about clinic and ask to please call for cancellations, she was agreeable.

## 2020-01-10 ENCOUNTER — Other Ambulatory Visit: Payer: Self-pay

## 2020-01-10 ENCOUNTER — Other Ambulatory Visit: Payer: Self-pay | Admitting: Internal Medicine

## 2020-01-10 ENCOUNTER — Ambulatory Visit (INDEPENDENT_AMBULATORY_CARE_PROVIDER_SITE_OTHER): Payer: Self-pay | Admitting: Internal Medicine

## 2020-01-10 DIAGNOSIS — F1121 Opioid dependence, in remission: Secondary | ICD-10-CM | POA: Insufficient documentation

## 2020-01-10 DIAGNOSIS — F1120 Opioid dependence, uncomplicated: Secondary | ICD-10-CM | POA: Insufficient documentation

## 2020-01-10 MED ORDER — BUPRENORPHINE HCL-NALOXONE HCL 8-2 MG SL SUBL: 1.0000 | SUBLINGUAL_TABLET | Freq: Two times a day (BID) | SUBLINGUAL | 0 refills | Status: DC

## 2020-01-10 MED FILL — BUPRENORPHIN-NALOXON 8-2 MG: 8-2 | 7 days supply | Qty: 14 | Fill #0

## 2020-01-10 NOTE — Patient Instructions (Addendum)
To Ms. Riese,  It was a pleasure meeting you today! Today we will start you on Suboxone, please take the medication as directed on this handout. We will see you back in one week's time to see how you are handling the medication! Have a good day! Sincerely,  Dolan Amen, MD  Instruction for starting buprenorphine-naloxone (Suboxone) at home  You should not mix buprenorphine-naloxone with other drugs especially large amounts of alcohol or benzodiazepines (Valium, Klonopin, Xanax, Ativan). If you have taken any of these medication, please tell your healthcare team and do not take buprenorphine-naloxone.   You must wait until you are feeling signs of withdrawal from opiates (heroin, pain pills) before you take buprenorphine-naloxone.  If you do not wait long enough the medication will make you sicker.  If you do take it too soon and get sicker then wait until later when you feel signs of withdrawal listed below and then try again.   Signs that you are withdrawing: ? Anxiety, restlessness, can't sit still ? Aches ? Nausea or sick to your stomach ? Goose-bumps ? Racing heart   You should have ALL of these symptoms before you start taking your first dose of buprenorphine-naloxone. If you are not sure call your healthcare team.    When it's time to take your first dose 1. Split your pill or film in half 2. Make sure your mouth is empty of everything (no candy/gum/etc) 3. Sit or stand, but do not lie down 4. Swallow a sip of water to wet your mouth  5. Put the half of the tablet or film under your tongue. Do not suck or swallow it. It must stay there until it is completely dissolved. Try to not even swallow your spit during this time. Anything that you swallow will not make you feel better.   In 20 minutes: You should start feeling a little better. If you feel worse then you started too early so you would wait a few hours and then try again later.    In one hour: You can take the other half  of the pill or film the same way you took the first one.   In 2 hours: if you are still feeling symptoms of withdrawal listed above you can take another half a pill or film. You can repeat this if needed until you take a total of 2 pills or 2 films (16mg ). You may need less than this to control your symptoms.  You should adjust your dose so that you are taking one and half or two pills or films per day (12-16mg  per day). At this dose you should have cut down on cravings and help with any withdrawal symptoms.   The next day:  In the morning you can take the same amount you took yesterday all at one time in the morning.  Expect a call from your team to see how you are doing.   If you have any questions or concerns at any time call your healthcare team.  Clinic Number:  830am - 5pm: [phone] After Hours Number: [phone] - - Leave your number and expect a call back from a physician.

## 2020-01-10 NOTE — Progress Notes (Addendum)
01/10/2020  [name] presents for buprenorphine/naloxone intake visit.   I have reviewed EPIC data including labwork which was available.  I have reviewed outside records provided by patient if available.  The salient points were confirmed with the patient.    Review of substance use history (first use, substances used, any illicit purchases): 24 y/o, pain medications (1st use), xanax, (snorted) heroin, buying from the streets  Last substance used: xanax, heroin  If last substance not an opioid, last opioid used (type, dose, route, withdrawal symptoms): Xanax, oral, no withdrawal symptoms.   Mental Health History: GAD, Anxiety, Depression  Current counseling/behavioural health provider: Does not see a provider   This patient has Opioid Use Disorder by following DSM-V criteria: Keep those that apply.  - Opioids taken in larger amounts or over a longer period than intended - Persistent desire to cut down - A great deal of time is spent to obtain/use/recover from the opioid - Cravings to use opioids - Use resulting in a failure to fulfill major role obligations - Continue opioid use despite persistent social or interpersonal problems - Important activities are given up or reduced because of opioid use - Recurrent opioid use in situations in which it is physically hazardous - Use despite knowledge of health problems caused by opioids - Tolerance - Withdrawal  Past Medical History:  Diagnosis Date  . ADHD (attention deficit hyperactivity disorder)   . Anesthesia complication    woke up fighting after tonsillectomy  . Anxiety   . Cholecystitis   . Complication of anesthesia    when wakes up " freaks out- like panic attack"  . Depression   . Dysmenorrhea   . GERD (gastroesophageal reflux disease)   . Hyperlipidemia   . Hypoglycemia   . Obesity   . Syncope   . Viral warts    hand  . Vision abnormalities    wears glasses    Current Outpatient Medications on File  Prior to Visit  Medication Sig Dispense Refill  . gabapentin (NEURONTIN) 100 MG capsule Take 3 capsules (300 mg total) by mouth 3 (three) times daily. 270 capsule 0  . nitrofurantoin, macrocrystal-monohydrate, (MACROBID) 100 MG capsule Take 1 capsule (100 mg total) by mouth 2 (two) times daily. (Patient not taking: Reported on 12/16/2019) 14 capsule 0  . promethazine (PHENERGAN) 25 MG tablet Take 0.5-1 tablets (12.5-25 mg total) by mouth every 6 (six) hours as needed for nausea or vomiting. (Patient not taking: Reported on 12/16/2019) 30 tablet 1   No current facility-administered medications on file prior to visit.    Physical Exam  There were no vitals filed for this visit.   Physical Exam Vitals reviewed.  Constitutional:      General: She is not in acute distress.    Appearance: Normal appearance. She is not ill-appearing or toxic-appearing.  HENT:     Head: Normocephalic and atraumatic.  Eyes:     Extraocular Movements: Extraocular movements intact.     Conjunctiva/sclera: Conjunctivae normal.     Comments: Moderate mydriasis bilaterally   Cardiovascular:     Rate and Rhythm: Normal rate and regular rhythm.     Pulses: Normal pulses.     Heart sounds: Normal heart sounds. No murmur heard. No friction rub. No gallop.   Pulmonary:     Effort: Pulmonary effort is normal.     Breath sounds: Normal breath sounds. No wheezing, rhonchi or rales.  Abdominal:     General: Bowel sounds are normal.  Palpations: Abdomen is soft.     Tenderness: There is no abdominal tenderness. There is no guarding.  Musculoskeletal:        General: No swelling.     Right lower leg: No edema.     Left lower leg: No edema.  Skin:    General: Skin is warm and dry.     Coloration: Skin is not jaundiced or pale.     Findings: No bruising, erythema, lesion or rash.  Neurological:     General: No focal deficit present.     Mental Status: She is alert and oriented to person, place, and time.   Psychiatric:        Behavior: Behavior normal.     Comments: Flat affect     Clinical Opiate Withdrawal Scale: bold applicable COWS scoring   - Resting HR:    - 0 for < 80   - 1 for 81 - 100   - 2 for 101 - 120   - 4 for > 120  - Sweating:   - 0 for no chills/flushing   - 1 for subjective chills/flushing   - 3 for beads of sweat on brow/face   - 4 for sweat streaming off of face  - Restlessness:    - 0 for able to sit still   - 1 for subjective difficulty sitting still   - 3 for frequent shifting or extraneous movement   - 5 for unable to sit still for more than a few seconds  - Pupil size:    - 0 for pinpoint or normal   - 1 for possibly larger than normal   - 2 for moderately dilated   - 5 for only iris rim visible  - Bone/joint pain:    - 0 for not present   - 1 for mild diffuse discomfort   - 2 severe diffuse aching   - 4 for objectively rubbing joints/muscles and obviously in pain  - Runny nose/tearing:    - 0 for not present   - 1 for stuffy nose/moist eyes   - 2 for nose running/tearing   - 4 for nose constantly running or tears streaming down cheeks  - GI Upset:    - 0 for no GI symptoms   - 1 for stomach cramps   - 2 for nausea or loose stool   - 3 for vomiting or diarrhea   - 5 for multiple episodes of vomiting or diarrhea  - Tremor observation of outstretched hands:    - 0 for no tremor   - 1 for tremor can be felt but not observed   - 2 for slight tremor observable   - 4 for gross tremor or muscle twitching  - Yawning:    - 0 for no yawning   - 1 for yawning once or twice during assessment   - 2 for yawning three or more times during assessment   - 4 for yawning several times per minute  - Anxiety or irritability:    - 0 for none   - 1 for patient reports increasing irritability or anxiousness   - 2 for patient obviously irritable/anxious   - 4 for patient so irritable/anxious that assessment is difficult  - Gooseflesh:    - 0 for skin is  smooth   - 3 for piloerection of skin can be felt or seen   - 5 for prominent piloerection  TOTAL: 16  Assessment/Plan:   Based on a  review of the patient's medical history including substance use and mental health factors, and physical exam, JALEY YAN is a suitable candidate for MAT with buprenorphine/naloxone.  UDS ordered this visit.     Home Induction:   I have instructed the patient how to appropriately take this medication, including placing under the tongue with head relaxed for 10 minutes and allowing to dissolve without chewing or swallowing tab/film, and with nothing to eat or drink in the subsequent 15 minutes.  They have been told not to start taking the medication until they have significant signs of withdrawal and I have explained the concept of precipitated withdrawal with the patient.    Intervisit Care:  We discussed this medication must be kept in a safe place and away from children.   We will see the patient back in 1 week in clinic, with options for a sooner appointment based on patient and provider preference.   Patient was encouraged to call the office and speak with the MD on call for any urgent concerns.    Dolan Amen, MD 01/10/2020 10:49 AM

## 2020-01-10 NOTE — Assessment & Plan Note (Addendum)
Patient presents with 3 years of (snorted) heroin usage, and Xanax. Patient's last use was before her psychiatric stay 12/18/2019. She is currently in moderate withdrawal with a COWS score of 16. She qualifies for Severe OUD by answering positively to all 11 of the criteria. Will start suboxone induction today.  - Suboxone 8-2 mg tablet twice daily through [facility] outpatient pharmacy - Self induction handout given, with education in the room about how to take and start medication. - FU one week's time - Tox screen at next visit

## 2020-01-11 NOTE — Progress Notes (Signed)
Internal Medicine Clinic Attending  I saw and evaluated the patient.  I personally confirmed the key portions of the history and exam documented by Dr. Winters and I reviewed pertinent patient test results.  The assessment, diagnosis, and plan were formulated together and I agree with the documentation in the resident's note.  

## 2020-01-15 ENCOUNTER — Inpatient Hospital Stay (HOSPITAL_COMMUNITY)
Admission: AD | Admit: 2020-01-15 | Discharge: 2020-01-15 | Disposition: A | Discharge status: Home / Self Care | Payer: Medicaid Other | Attending: Obstetrics & Gynecology | Admitting: Obstetrics & Gynecology

## 2020-01-15 ENCOUNTER — Other Ambulatory Visit: Payer: Self-pay

## 2020-01-15 ENCOUNTER — Encounter (HOSPITAL_COMMUNITY): Payer: Self-pay | Admitting: Obstetrics & Gynecology

## 2020-01-15 DIAGNOSIS — O99611 Diseases of the digestive system complicating pregnancy, first trimester: Secondary | ICD-10-CM | POA: Insufficient documentation

## 2020-01-15 DIAGNOSIS — Z79899 Other long term (current) drug therapy: Secondary | ICD-10-CM | POA: Diagnosis not present

## 2020-01-15 DIAGNOSIS — O99211 Obesity complicating pregnancy, first trimester: Secondary | ICD-10-CM | POA: Diagnosis not present

## 2020-01-15 DIAGNOSIS — K219 Gastro-esophageal reflux disease without esophagitis: Secondary | ICD-10-CM | POA: Insufficient documentation

## 2020-01-15 DIAGNOSIS — O219 Vomiting of pregnancy, unspecified: Secondary | ICD-10-CM | POA: Insufficient documentation

## 2020-01-15 DIAGNOSIS — O99331 Smoking (tobacco) complicating pregnancy, first trimester: Secondary | ICD-10-CM | POA: Diagnosis not present

## 2020-01-15 DIAGNOSIS — Z3A01 Less than 8 weeks gestation of pregnancy: Secondary | ICD-10-CM | POA: Insufficient documentation

## 2020-01-15 DIAGNOSIS — F17210 Nicotine dependence, cigarettes, uncomplicated: Secondary | ICD-10-CM | POA: Insufficient documentation

## 2020-01-15 DIAGNOSIS — O210 Mild hyperemesis gravidarum: Secondary | ICD-10-CM

## 2020-01-15 LAB — COMPREHENSIVE METABOLIC PANEL
ALT: 11 U/L (ref 0–44)
AST: 15 U/L (ref 15–41)
Albumin: 4.2 g/dL (ref 3.5–5.0)
Alkaline Phosphatase: 36 U/L — ABNORMAL LOW (ref 38–126)
Anion gap: 14 (ref 5–15)
BUN: 11 mg/dL (ref 6–20)
CO2: 17 mmol/L — ABNORMAL LOW (ref 22–32)
Calcium: 9.4 mg/dL (ref 8.9–10.3)
Chloride: 104 mmol/L (ref 98–111)
Creatinine, Ser: 0.66 mg/dL (ref 0.44–1.00)
GFR, Estimated: >60 mL/min (ref >60)
Glucose, Bld: 69 mg/dL — ABNORMAL LOW (ref 70–99)
Potassium: 4.1 mmol/L (ref 3.5–5.1)
Sodium: 135 mmol/L (ref 135–145)
Total Bilirubin: 1.4 mg/dL — ABNORMAL HIGH (ref 0.3–1.2)
Total Protein: 6.9 g/dL (ref 6.5–8.1)

## 2020-01-15 LAB — CBC
HCT: 39.5 % (ref 36.0–46.0)
Hemoglobin: 12.6 g/dL (ref 12.0–15.0)
MCH: 26.9 pg (ref 26.0–34.0)
MCHC: 31.9 g/dL (ref 30.0–36.0)
MCV: 84.4 fL (ref 80.0–100.0)
Platelets: 164 10*3/uL (ref 150–400)
RBC: 4.68 MIL/uL (ref 3.87–5.11)
RDW: 12.3 % (ref 11.5–15.5)
WBC: 4.2 10*3/uL (ref 4.0–10.5)
nRBC: 0 % (ref 0.0–0.2)

## 2020-01-15 LAB — URINALYSIS, ROUTINE W REFLEX MICROSCOPIC
Bacteria, UA: NONE SEEN
Bilirubin Urine: NEGATIVE
Glucose, UA: NEGATIVE mg/dL
Hgb urine dipstick: NEGATIVE
Ketones, ur: 80 mg/dL — AB
Leukocytes,Ua: NEGATIVE
Nitrite: NEGATIVE
Protein, ur: 30 mg/dL — AB
Specific Gravity, Urine: 1.027 (ref 1.005–1.030)
pH: 5 (ref 5.0–8.0)

## 2020-01-15 LAB — POCT PREGNANCY, URINE: Preg Test, Ur: POSITIVE — AB

## 2020-01-15 MED ORDER — FAMOTIDINE IN NACL 20-0.9 MG/50ML-% IV SOLN
20.0000 mg | Freq: Once | INTRAVENOUS | Status: AC
  Administered 2020-01-15: 16:00:00 20 mg via INTRAVENOUS
  Filled 2020-01-15: qty 50

## 2020-01-15 MED ORDER — SCOPOLAMINE 1 MG/3DAYS TD PT72: 1.0000 | MEDICATED_PATCH | TRANSDERMAL | 12 refills | Status: DC

## 2020-01-15 MED ORDER — ONDANSETRON HCL 4 MG/2ML IJ SOLN
4.0000 mg | Freq: Once | INTRAMUSCULAR | Status: AC
  Administered 2020-01-15: 20:00:00 4 mg via INTRAVENOUS
  Filled 2020-01-15: qty 2

## 2020-01-15 MED ORDER — M.V.I. ADULT IV INJ
Freq: Once | INTRAVENOUS | Status: AC
  Filled 2020-01-15: qty 1000

## 2020-01-15 MED ORDER — PROMETHAZINE HCL 25 MG/ML IJ SOLN
12.5000 mg | Freq: Four times a day (QID) | INTRAMUSCULAR | Status: DC | PRN
  Administered 2020-01-15: 16:00:00 12.5 mg via INTRAVENOUS
  Filled 2020-01-15: qty 1

## 2020-01-15 MED ORDER — SCOPOLAMINE 1 MG/3DAYS TD PT72
1.0000 | MEDICATED_PATCH | TRANSDERMAL | Status: DC
  Administered 2020-01-15: 17:00:00 1.5 mg via TRANSDERMAL
  Filled 2020-01-15: qty 1

## 2020-01-15 MED ORDER — LACTATED RINGERS IV BOLUS
1000.0000 mL | Freq: Once | INTRAVENOUS | Status: AC
  Administered 2020-01-15: 16:00:00 1000 mL via INTRAVENOUS

## 2020-01-15 MED ORDER — PROMETHAZINE HCL 25 MG PO TABS: 12.5000 mg | ORAL_TABLET | Freq: Four times a day (QID) | ORAL | 2 refills | Status: DC | PRN

## 2020-01-15 MED ORDER — PROMETHAZINE HCL 25 MG/ML IJ SOLN
12.5000 mg | Freq: Once | INTRAMUSCULAR | Status: AC
  Administered 2020-01-15: 20:00:00 12.5 mg via INTRAVENOUS
  Filled 2020-01-15: qty 1

## 2020-01-15 MED ORDER — ONDANSETRON HCL 4 MG PO TABS: 4.0000 mg | ORAL_TABLET | Freq: Three times a day (TID) | ORAL | 2 refills | Status: DC | PRN

## 2020-01-15 NOTE — MAU Provider Note (Signed)
Chief Complaint: Emesis and Nausea   Event Date/Time   First Provider Initiated Contact with Patient 01/15/20 1510      SUBJECTIVE HPI: [name] is a 24 y.o. G2P0010 at [redacted]w[redacted]d by sure LMP who presents to maternity admissions reporting nausea with emesis 10+ times in the last 24 hours. She is unable to keep down any food or fluids. She has not tried any treatments. She plans to start care at [facility] but was told she needed proof of pregnancy.  There are no other s/sx, she denies pain or vaginal bleeding.  HPI  Past Medical History:  Diagnosis Date  . ADHD (attention deficit hyperactivity disorder)   . Anesthesia complication    woke up fighting after tonsillectomy  . Anxiety   . Cholecystitis   . Complication of anesthesia    when wakes up " freaks out- like panic attack"  . Depression   . Dysmenorrhea   . GERD (gastroesophageal reflux disease)   . Hyperlipidemia   . Hypoglycemia   . Obesity   . Syncope   . Viral warts    hand  . Vision abnormalities    wears glasses   Past Surgical History:  Procedure Laterality Date  . CHOLECYSTECTOMY  03/21/2011   Procedure: LAPAROSCOPIC CHOLECYSTECTOMY;  Surgeon: Shelly Rubenstein, MD;  Location: [facility] OR;  Service: General;  Laterality: N/A;  . ESOPHAGOGASTRODUODENOSCOPY  09/26/2011   Procedure: ESOPHAGOGASTRODUODENOSCOPY (EGD);  Surgeon: Jon Gills, MD;  Location: [facility] OR;  Service: Gastroenterology;  Laterality: N/A;  . TONSILLECTOMY AND ADENOIDECTOMY  06/2005  . WISDOM TOOTH EXTRACTION     Social History   Socioeconomic History  . Marital status: Single    Spouse name: Not on file  . Number of children: Not on file  . Years of education: Not on file  . Highest education level: Not on file  Occupational History  . 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 Smoking status: Current Every Day Smoker    Packs/day: 0.50    Years: 4.00    Pack years: 2.00    Types: Cigarettes  . Smokeless tobacco:  Never Used  . Tobacco comment: 0.5 PPD  Vaping Use  . Vaping Use: Never used  Substance and Sexual Activity  . Alcohol use: Not Currently    Comment: socially  . Drug use: Not Currently    Types: Marijuana    Comment: last used 2014  . Sexual activity: Yes    Birth control/protection: None  Other Topics Concern  . Not on file  Social History Narrative   Repeating 9th grade (missed 40 days last year despite homebound instruction)   Social Determinants of Health   Financial Resource Strain: Not on file  Food Insecurity: Not on file  Transportation Needs: Not on file  Physical Activity: Not on file  Stress: Not on file  Social Connections: Not on file  Intimate Partner Violence: Not on file   No current facility-administered medications on file prior to encounter.   Current Outpatient Medications on File Prior to Encounter  Medication Sig Dispense Refill  . buprenorphine-naloxone (SUBOXONE) 8-2 mg SUBL SL tablet Place 1 tablet under the tongue in the morning and at bedtime for 7 days. 14 tablet 0  . gabapentin (NEURONTIN) 100 MG capsule Take 3 capsules (300 mg total) by mouth 3 (three) times daily. 270 capsule 0  . nitrofurantoin, macrocrystal-monohydrate, (MACROBID) 100 MG capsule Take 1 capsule (100 mg total)  by mouth 2 (two) times daily. (Patient not taking: Reported on 12/16/2019) 14 capsule 0  . promethazine (PHENERGAN) 25 MG tablet Take 0.5-1 tablets (12.5-25 mg total) by mouth every 6 (six) hours as needed for nausea or vomiting. (Patient not taking: Reported on 12/16/2019) 30 tablet 1   Allergies  Allergen Reactions  . Blueberry Fruit Extract Anaphylaxis  . Contrast Media [Iodinated Diagnostic Agents] Anaphylaxis and Rash  . Codeine Hives, Itching and Nausea And Vomiting  . Omnipaque [Iohexol] Itching, Nausea And Vomiting and Swelling    ROS:  Review of Systems  Constitutional: Negative for chills, fatigue and fever.  Eyes: Negative for visual disturbance.   Respiratory: Negative for shortness of breath.   Cardiovascular: Negative for chest pain.  Gastrointestinal: Negative for abdominal pain, nausea and vomiting.  Genitourinary: Negative for difficulty urinating, dysuria, flank pain, pelvic pain, vaginal bleeding, vaginal discharge and vaginal pain.  Neurological: Negative for dizziness and headaches.  Psychiatric/Behavioral: Negative.      I have reviewed patient's Past Medical Hx, Surgical Hx, Family Hx, Social Hx, medications and allergies.   Physical Exam   Patient Vitals for the past 24 hrs:  BP Temp Temp src Pulse Resp SpO2 Height Weight  01/15/20 1406 (!) 109/58 98.4 F (36.9 C) Oral 90 16 100 % 5\' 3"  (1.6 m) 62.8 kg   Constitutional: Well-developed, well-nourished female in no acute distress.  Cardiovascular: normal rate Respiratory: normal effort GI: Abd soft, non-tender. Pos BS x 4 MS: Extremities nontender, no edema, normal ROM Neurologic: Alert and oriented x 4.  GU: Neg CVAT.  PELVIC EXAM: Deferred   LAB RESULTS Results for orders placed or performed during the hospital encounter of 01/15/20 (from the past 24 hour(s))  Pregnancy, urine POC     Status: Abnormal   Collection Time: 01/15/20  2:17 PM  Result Value Ref Range   Preg Test, Ur POSITIVE (A) NEGATIVE  Urinalysis, Routine w reflex microscopic Urine, Clean Catch     Status: Abnormal   Collection Time: 01/15/20  2:19 PM  Result Value Ref Range   Color, Urine YELLOW YELLOW   APPearance HAZY (A) CLEAR   Specific Gravity, Urine 1.027 1.005 - 1.030   pH 5.0 5.0 - 8.0   Glucose, UA NEGATIVE NEGATIVE mg/dL   Hgb urine dipstick NEGATIVE NEGATIVE   Bilirubin Urine NEGATIVE NEGATIVE   Ketones, ur 80 (A) NEGATIVE mg/dL   Protein, ur 30 (A) NEGATIVE mg/dL   Nitrite NEGATIVE NEGATIVE   Leukocytes,Ua NEGATIVE NEGATIVE   RBC / HPF 0-5 0 - 5 RBC/hpf   WBC, UA 0-5 0 - 5 WBC/hpf   Bacteria, UA NONE SEEN NONE SEEN   Squamous Epithelial / LPF 0-5 0 - 5   Mucus  PRESENT   CBC     Status: None   Collection Time: 01/15/20  3:40 PM  Result Value Ref Range   WBC 4.2 4.0 - 10.5 K/uL   RBC 4.68 3.87 - 5.11 MIL/uL   Hemoglobin 12.6 12.0 - 15.0 g/dL   HCT 01/17/20 40.8 - 14.4 %   MCV 84.4 80.0 - 100.0 fL   MCH 26.9 26.0 - 34.0 pg   MCHC 31.9 30.0 - 36.0 g/dL   RDW 81.8 56.3 - 14.9 %   Platelets 164 150 - 400 K/uL   nRBC 0.0 0.0 - 0.2 %  Comprehensive metabolic panel     Status: Abnormal   Collection Time: 01/15/20  3:40 PM  Result Value Ref Range   Sodium 135 135 -  145 mmol/L   Potassium 4.1 3.5 - 5.1 mmol/L   Chloride 104 98 - 111 mmol/L   CO2 17 (L) 22 - 32 mmol/L   Glucose, Bld 69 (L) 70 - 99 mg/dL   BUN 11 6 - 20 mg/dL   Creatinine, Ser 1.28 0.44 - 1.00 mg/dL   Calcium 9.4 8.9 - 78.6 mg/dL   Total Protein 6.9 6.5 - 8.1 g/dL   Albumin 4.2 3.5 - 5.0 g/dL   AST 15 15 - 41 U/L   ALT 11 0 - 44 U/L   Alkaline Phosphatase 36 (L) 38 - 126 U/L   Total Bilirubin 1.4 (H) 0.3 - 1.2 mg/dL   GFR, Estimated >76 >72 mL/min   Anion gap 14 5 - 15       IMAGING No results found.  MAU Management/MDM: Orders Placed This Encounter  Procedures  . Urinalysis, Routine w reflex microscopic Urine, Clean Catch  . CBC  . Comprehensive metabolic panel  . Rapid urine drug screen (hospital performed)  . Pregnancy, urine POC  . Discharge patient    Meds ordered this encounter  Medications  . lactated ringers bolus 1,000 mL  . promethazine (PHENERGAN) injection 12.5 mg  . famotidine (PEPCID) IVPB 20 mg premix  . scopolamine (TRANSDERM-SCOP) 1 MG/3DAYS 1.5 mg  . dextrose 5% lactated ringers 1,000 mL with multivitamins adult (INFUVITE ADULT) 10 mL infusion  . ondansetron (ZOFRAN) injection 4 mg  . promethazine (PHENERGAN) 25 MG tablet    Sig: Take 0.5-1 tablets (12.5-25 mg total) by mouth every 6 (six) hours as needed for nausea. If you do not tolerate the pills by mouth, you can place them in the vagina or rectum instead.    Dispense:  30 tablet    Refill:   2    Order Specific Question:   Supervising Provider    Answer:   Jaynie Collins A [[id number]]  . scopolamine (TRANSDERM-SCOP) 1 MG/3DAYS    Sig: Place 1 patch (1.5 mg total) onto the skin every 3 (three) days.    Dispense:  10 patch    Refill:  12    Order Specific Question:   Supervising Provider    Answer:   Jaynie Collins A [[id number]]  . ondansetron (ZOFRAN) 4 MG tablet    Sig: Take 1 tablet (4 mg total) by mouth every 8 (eight) hours as needed for nausea or vomiting.    Dispense:  20 tablet    Refill:  2    Order Specific Question:   Supervising Provider    Answer:   Jaynie Collins A [[id number]]  . promethazine (PHENERGAN) injection 12.5 mg    UA consistent with mild dehydration.  Fluids and antiemetics given. Order for outpatient dating confirmation [place] ordered, pt to follow up at [facility].  Proof of pregnancy letter provided today. Pt improved initially after Phenergan and Pepcid but reported more nausea within 2-3 hours so Zofran and second dose of Phenergan 12.5 mg IV. Discussed with the patient the risk of Zofran use in the first trimester of pregnancy. Risks of use in the first trimester include birth defects in babies, specifically cleft lip/palate.  Pt states understanding and plans to use Zofran sparingly.   Rx for Phenergan, Zofran, and Scopolamine patch sent to pharmacy. Pt waiting on insurance so will pick up Phenergan and get others when insurance covers.  Return precautions reviewed.   ASSESSMENT 1. Nausea and vomiting during pregnancy prior to [redacted] weeks gestation     PLAN Discharge  home Allergies as of 01/15/2020      Reactions   Blueberry Fruit Extract Anaphylaxis   Contrast Media [iodinated Diagnostic Agents] Anaphylaxis, Rash   Codeine Hives, Itching, Nausea And Vomiting   Omnipaque [iohexol] Itching, Nausea And Vomiting, Swelling      Medication List    STOP taking these medications   nitrofurantoin (macrocrystal-monohydrate) 100 MG capsule Commonly known as:  MACROBID     TAKE these medications   buprenorphine-naloxone 8-2 mg Subl SL tablet Commonly known as: SUBOXONE Place 1 tablet under the tongue in the morning and at bedtime for 7 days.   gabapentin 100 MG capsule Commonly known as: NEURONTIN Take 3 capsules (300 mg total) by mouth 3 (three) times daily.   ondansetron 4 MG tablet Commonly known as: Zofran Take 1 tablet (4 mg total) by mouth every 8 (eight) hours as needed for nausea or vomiting.   promethazine 25 MG tablet Commonly known as: PHENERGAN Take 0.5-1 tablets (12.5-25 mg total) by mouth every 6 (six) hours as needed for nausea. If you do not tolerate the pills by mouth, you can place them in the vagina or rectum instead. What changed:   reasons to take this  additional instructions   scopolamine 1 MG/3DAYS Commonly known as: TRANSDERM-SCOP Place 1 patch (1.5 mg total) onto the skin every 3 (three) days. Start taking on: January 18, 2020        Sharen CounterLisa Leftwich-Kirby Certified Nurse-Midwife 01/15/2020  7:56 PM

## 2020-01-15 NOTE — MAU Note (Signed)
[name] is a 24 y.o. here in MAU reporting: had a + UPT on 12/1. Started vomiting about 2 days ago, > 10 episodes in the past 24 hours. Is unable to keep down foods or liquids. Denies pain and bleeding. Does report increased discharge but denies odor or itching.  LMP: 11/26/19  Onset of complaint: 2 days  Pain score: 0/10  Vitals:   01/15/20 1406  BP: (!) 109/58  Pulse: 90  Resp: 16  Temp: 98.4 F (36.9 C)  SpO2: 100%     Lab orders placed from triage: UPT

## 2020-01-16 ENCOUNTER — Other Ambulatory Visit: Payer: Self-pay | Admitting: Internal Medicine

## 2020-01-16 ENCOUNTER — Ambulatory Visit (INDEPENDENT_AMBULATORY_CARE_PROVIDER_SITE_OTHER): Payer: Self-pay | Admitting: Internal Medicine

## 2020-01-16 ENCOUNTER — Encounter: Payer: Self-pay | Admitting: Internal Medicine

## 2020-01-16 ENCOUNTER — Other Ambulatory Visit: Payer: Self-pay

## 2020-01-16 VITALS — BP 90/45 | HR 70 | Temp 98.3°F | Ht 63.0 in | Wt 145.6 lb

## 2020-01-16 DIAGNOSIS — F1120 Opioid dependence, uncomplicated: Secondary | ICD-10-CM

## 2020-01-16 MED ORDER — BUPRENORPHINE HCL-NALOXONE HCL 8-2 MG SL SUBL: 1.0000 | SUBLINGUAL_TABLET | Freq: Two times a day (BID) | SUBLINGUAL | 0 refills | Status: DC

## 2020-01-16 MED FILL — BUPRENORPHIN-NALOXON 8-2 MG: 8-2 | 14 days supply | Qty: 28 | Fill #0

## 2020-01-16 NOTE — Patient Instructions (Signed)
To Ms. Panik,  It was a pleasure meeting you again today! Today we discussed your Suboxone medication. I am glad to hear that there have been no relapses since the last I saw you. We will continue your suboxone for 2 weeks and have you follow back with [place] in clinic. Have a happy holiday season!  Sincerely,  Dolan Amen, MD

## 2020-01-16 NOTE — Progress Notes (Signed)
   01/16/2020  [name] presents for follow up of opioid use disorder I have reviewed the prior induction visit, follow up visits, and telephone encounters relevant to opiate use disorder (OUD) treatment.   Current daily dose: Suboxone 8-2 mg twice daily   Date of Induction: 01/10/2020  Current follow up interval, in weeks: 2 weeks  The patient has been adherent with the buprenorphine for OUD contract.   Last UDS Result: Today- Results pending.   HPI:  Patient is compliant with her medications. She states that she is doing well since her last visit. She takes her Suboxone as indicated and lets it completely dissolve. She does endorse having some cravings, but is doing well overall. She is not having symptoms of withdrawal at this time. Additionally, patient found out that she is recently pregnant. Patient endorses that it was expected, and plans to carry out to full term.   Exam:   Vitals:   01/16/20 1357  BP: (!) 90/45  Pulse: 70  Temp: 98.3 F (36.8 C)  TempSrc: Oral  SpO2: 100%  Weight: 145 lb 9.6 oz (66 kg)  Height: 5\' 3"  (1.6 m)    Physical Exam Vitals reviewed.  Constitutional:      General: She is not in acute distress.    Appearance: Normal appearance. She is not ill-appearing or toxic-appearing.  Cardiovascular:     Rate and Rhythm: Normal rate and regular rhythm.     Pulses: Normal pulses.     Heart sounds: Normal heart sounds. No murmur heard. No friction rub. No gallop.   Pulmonary:     Effort: Pulmonary effort is normal.     Breath sounds: Normal breath sounds. No wheezing, rhonchi or rales.  Abdominal:     General: Bowel sounds are normal.     Palpations: Abdomen is soft.     Tenderness: There is no abdominal tenderness. There is no guarding.  Musculoskeletal:        General: No swelling.     Right lower leg: No edema.     Left lower leg: No edema.  Neurological:     General: No focal deficit present.     Mental Status: She is alert and  oriented to person, place, and time.  Psychiatric:        Mood and Affect: Mood normal.        Behavior: Behavior normal.     Assessment/Plan:  See Problem Based Charting in the Encounters Tab     , MD  01/16/2020  2:15 PM

## 2020-01-17 ENCOUNTER — Encounter: Payer: Self-pay | Admitting: Internal Medicine

## 2020-01-17 NOTE — Assessment & Plan Note (Signed)
Patient presents today for follow up for her severe OUD. Patient is compliant with her medications, but recently discovered she was pregnant. She is taking suboxone 8-2 mg twice daily. We discussed side effects of suboxone during pregnancy, and agreed upon continuing the medication. Due to the holiday season and clinic closure, we will supply [name] with a 2 week supply of suboxone, and have her follow up in the new year.   P:  - Suboxone 8-2 mg tablet twice daily. 14 day supply - Follow up in 2 weeks.  - Utox today

## 2020-01-25 LAB — TOXASSURE SELECT,+ANTIDEPR,UR

## 2020-01-28 NOTE — L&D Delivery Note (Addendum)
[facility]N [facility] Delivery Note  [name] is a 25 y.o. G2P0010 s/p PPD#0 SVD at [redacted]w[redacted]d.   ROM: 5h 54m with clear fluid GBS Status: negative  Delivery Date/Time: 3790 Delivery: Called to room and patient was complete and pushing. Head delivered ROA. Cord wrapped around should, delivered through. Shoulder and body delivered in usual fashion. Infant with spontaneous cry, placed on mother's abdomen, dried and stimulated. Cord clamped x 2 after 1-minute delay, and cut by FOB under my direct supervision. Cord blood drawn. Placenta delivered spontaneously with gentle cord traction. Fundus firm with massage and Pitocin. Labia, perineum, vagina, and cervix were inspected, 1st degree perineal.   Placenta: complete, three vessel cord appreciated Complications: N/A Lacerations: 1st degree perineal EBL: 100 mL Analgesia: Epidural  Postpartum Planning [x]  message to sent to schedule follow-up    Infant:   APGARs 8, 9   Allee , MD [facility], [facility]     I have seen and examined this patient and agree with above documentation in the resident's note. I was gloved and present at the time of delivery and available for questions/assistance.  [facility], MSN, CNM 08/28/20 2:56 AM

## 2020-01-30 ENCOUNTER — Ambulatory Visit (INDEPENDENT_AMBULATORY_CARE_PROVIDER_SITE_OTHER): Payer: Self-pay | Admitting: Internal Medicine

## 2020-01-30 ENCOUNTER — Other Ambulatory Visit: Payer: Self-pay

## 2020-01-30 ENCOUNTER — Other Ambulatory Visit: Payer: Self-pay | Admitting: Internal Medicine

## 2020-01-30 DIAGNOSIS — F1120 Opioid dependence, uncomplicated: Secondary | ICD-10-CM

## 2020-01-30 MED ORDER — BUPRENORPHINE HCL-NALOXONE HCL 8-2 MG SL SUBL
1.0000 | SUBLINGUAL_TABLET | Freq: Two times a day (BID) | SUBLINGUAL | 0 refills | Status: DC
Start: 2020-01-30 — End: 2020-02-14

## 2020-01-30 NOTE — Progress Notes (Signed)
I connected with  [name] on 01/30/20 by a video enabled telemedicine application and verified that I am speaking with the correct person using two identifiers.   I discussed the limitations of evaluation and management by telemedicine. The patient expressed understanding and agreed to proceed.    CC: Cough  This is a telephone encounter between [name] and Dellia Cloud on 01/30/2020 for cough. The visit was conducted with the patient located at home and Dellia Cloud at [facility]. The patient's identity was confirmed using their DOB and current address. The patient has consented to being evaluated through a telephone encounter and understands the associated risks (an examination cannot be done and the patient may need to come in for an appointment) / benefits (allows the patient to remain at home, decreasing exposure to coronavirus). I personally spent 10 minutes on medical discussion.   HPI:  Ms.Jacklynn Kathie Rhodes Lanagan is a 25 y.o. with PMH as below.   Please see A&P for assessment of the patient's acute and chronic medical conditions.   Past Medical History:  Diagnosis Date  . ADHD (attention deficit hyperactivity disorder)   . Anesthesia complication    woke up fighting after tonsillectomy  . Anxiety   . Cholecystitis   . Complication of anesthesia    when wakes up " freaks out- like panic attack"  . Depression   . Dysmenorrhea   . GERD (gastroesophageal reflux disease)   . Hyperlipidemia   . Hypoglycemia   . Obesity   . Syncope   . Viral warts    hand  . Vision abnormalities    wears glasses   Review of Systems:  Negative except what is noted in problem based assessment and plan.     Assessment & Plan:   See Encounters Tab for problem based charting.  Patient discussed with Dr. Antony Contras

## 2020-01-31 ENCOUNTER — Telehealth: Payer: Self-pay | Admitting: *Deleted

## 2020-01-31 ENCOUNTER — Encounter: Payer: Self-pay | Admitting: Internal Medicine

## 2020-01-31 NOTE — Assessment & Plan Note (Addendum)
Presents for telehealth visit for her opioid use disorder.  States that she has not had any relapses and denies any symptoms of withdrawal.  She admits to being compliant with her medication but does endorse some increased cravings and periodically takes an extra half in the afternoon to help with this.    She states that she has had some body aches and cold chills that she was initially concerned for withdrawal symptoms.  She states that she was recently exposed to her grandfather who tested positive for Covid.  She states that she denies any shortness of breath, cough, change in taste or smell, rhinorrhea, or other upper respiratory symptoms.  But she does admit to fatigue, body aches, sore throat and chills.  I counseled the patient to get tested for COVID-19 and to call the clinic if she is positive.  She will need referral for monoclonal antibodies treatment if she is positive.  Furthermore, I counseled her to go to the hospital if she becomes short of breath.  Patient states that she is not taking any other medications but does smoke up to 2 to 3 cigarettes/day.  Her recent pregnancy I counseled her on the importance of cessation.  She states that she is trying to quit slowly.  Patient's last U tox was 2 weeks ago and was as expected.  We will give her another 2-week supply of Suboxone 8-2 mg tablets twice daily.  She will need a 2-week follow-up appointment in the office.  If her U tox at that time is appropriate, she will need to be scheduled for 1 month follow-up.  Plan: -Refill Suboxone 8-2 mg tablets twice daily.  2-week supply -Follow-up with in person visit in 2 weeks -U tox in 2 weeks

## 2020-01-31 NOTE — Telephone Encounter (Signed)
Call from pt's mother who stated pt has been exposed toc ovid from her step-father who is positive.stated pt has fever 101.0 axillary and c/o "bad headache". Also stated she's [redacted] weeks pregnant. Instructed to take pt to the ED. She agreed to take pt to the [facility] ED. Is this correct?

## 2020-02-01 NOTE — Progress Notes (Signed)
Internal Medicine Clinic Attending ? ?Case discussed with Dr. Winters  At the time of the visit.  We reviewed the resident?s history and exam and pertinent patient test results.  I agree with the assessment, diagnosis, and plan of care documented in the resident?s note.  ?

## 2020-02-01 NOTE — Telephone Encounter (Signed)
F/U I had called pt back yesterday afternoon - informed the mother , pt will need to be tested. Mother stated [facility] was [facility]

## 2020-02-01 NOTE — Telephone Encounter (Signed)
I agree

## 2020-02-02 MED FILL — BUPRENORPHIN-NALOXON 8-2 MG: 8-2 | 14 days supply | Qty: 28 | Fill #0

## 2020-02-06 ENCOUNTER — Telehealth: Payer: Self-pay

## 2020-02-06 NOTE — Telephone Encounter (Signed)
Patient has an appt scheduled for 02/14/2020 at 0915. Returned call to patient/mom. No answer. Left message on VM requesting return call. Kinnie Feil, BSN, RN-BC

## 2020-02-06 NOTE — Telephone Encounter (Signed)
Pt's mother requesting an appt for OUD for Tuesday afternoon. Please call back.

## 2020-02-07 NOTE — Addendum Note (Signed)
Addended by: Burnell Blanks on: 02/07/2020 12:20 PM   Modules accepted: Level of Service

## 2020-02-07 NOTE — Progress Notes (Addendum)
Internal Medicine Clinic Attending  Case discussed with Dr. Coe  At the time of the visit.  We reviewed the resident's history and exam and pertinent patient test results.  I agree with the assessment, diagnosis, and plan of care documented in the resident's note.  

## 2020-02-14 ENCOUNTER — Other Ambulatory Visit (HOSPITAL_COMMUNITY)
Admission: RE | Admit: 2020-02-14 | Discharge: 2020-02-14 | Disposition: A | Discharge status: Home / Self Care | Payer: Medicaid Other | Source: Ambulatory Visit | Attending: Family Medicine | Admitting: Family Medicine

## 2020-02-14 ENCOUNTER — Ambulatory Visit (INDEPENDENT_AMBULATORY_CARE_PROVIDER_SITE_OTHER): Payer: Self-pay | Admitting: Obstetrics and Gynecology

## 2020-02-14 ENCOUNTER — Other Ambulatory Visit: Payer: Self-pay | Admitting: Student in an Organized Health Care Education/Training Program

## 2020-02-14 ENCOUNTER — Telehealth: Payer: Self-pay

## 2020-02-14 ENCOUNTER — Other Ambulatory Visit: Payer: Self-pay | Admitting: Internal Medicine

## 2020-02-14 ENCOUNTER — Encounter: Payer: Self-pay | Admitting: Obstetrics and Gynecology

## 2020-02-14 ENCOUNTER — Other Ambulatory Visit: Payer: Self-pay

## 2020-02-14 VITALS — BP 122/59 | HR 97 | Wt 156.4 lb

## 2020-02-14 DIAGNOSIS — F1120 Opioid dependence, uncomplicated: Secondary | ICD-10-CM

## 2020-02-14 DIAGNOSIS — Z3401 Encounter for supervision of normal first pregnancy, first trimester: Secondary | ICD-10-CM | POA: Diagnosis present

## 2020-02-14 DIAGNOSIS — F332 Major depressive disorder, recurrent severe without psychotic features: Secondary | ICD-10-CM

## 2020-02-14 DIAGNOSIS — Z3400 Encounter for supervision of normal first pregnancy, unspecified trimester: Secondary | ICD-10-CM

## 2020-02-14 DIAGNOSIS — O98811 Other maternal infectious and parasitic diseases complicating pregnancy, first trimester: Secondary | ICD-10-CM | POA: Diagnosis not present

## 2020-02-14 DIAGNOSIS — Z3A11 11 weeks gestation of pregnancy: Secondary | ICD-10-CM | POA: Insufficient documentation

## 2020-02-14 DIAGNOSIS — F902 Attention-deficit hyperactivity disorder, combined type: Secondary | ICD-10-CM

## 2020-02-14 DIAGNOSIS — O99320 Drug use complicating pregnancy, unspecified trimester: Secondary | ICD-10-CM

## 2020-02-14 MED ORDER — BLOOD PRESSURE KIT DEVI: 1.0000 | 0 refills | Status: DC | PRN

## 2020-02-14 MED ORDER — VITAFOL ULTRA 29-0.6-0.4-200 MG PO CAPS: 1.0000 | ORAL_CAPSULE | Freq: Every day | ORAL | 12 refills | Status: DC

## 2020-02-14 NOTE — Patient Instructions (Signed)
 Obstetrics: Normal and Problem Pregnancies (7th ed., pp. 102-121). [place], [place]: [employer]."> Textbook of Family Medicine (9th ed., pp. 365-410). [place], [place]: [employer].">  First Trimester of Pregnancy  The first trimester of pregnancy starts on the first day of your last menstrual period until the end of week 12. This is months 1 through 3 of pregnancy. A week after a sperm fertilizes an egg, the egg will implant into the wall of the uterus and begin to develop into a baby. By the end of 12 weeks, all the baby's organs will be formed and the baby will be 2-3 inches in size. Body changes during your first trimester Your body goes through many changes during pregnancy. The changes vary and generally return to normal after your baby is born. Physical changes  You may gain or lose weight.  Your breasts may begin to grow larger and become tender. The tissue that surrounds your nipples (areola) may become darker.  Dark spots or blotches (chloasma or mask of pregnancy) may develop on your face.  You may have changes in your hair. These can include thickening or thinning of your hair or changes in texture. Health changes  You may feel nauseous, and you may vomit.  You may have heartburn.  You may develop headaches.  You may develop constipation.  Your gums may bleed and may be sensitive to brushing and flossing. Other changes  You may tire easily.  You may urinate more often.  Your menstrual periods will stop.  You may have a loss of appetite.  You may develop cravings for certain kinds of food.  You may have changes in your emotions from day to day.  You may have more vivid and strange dreams. Follow these instructions at home: Medicines  Follow your health care provider's instructions regarding medicine use. Specific medicines may be either safe or unsafe to take during pregnancy. Do not take any medicines unless told to by your health care provider.  Take  a prenatal vitamin that contains at least 600 micrograms (mcg) of folic acid. Eating and drinking  Eat a healthy diet that includes fresh fruits and vegetables, whole grains, good sources of protein such as meat, eggs, or tofu, and low-fat dairy products.  Avoid raw meat and unpasteurized juice, milk, and cheese. These carry germs that can harm you and your baby.  If you feel nauseous or you vomit: ? Eat 4 or 5 small meals a day instead of 3 large meals. ? Try eating a few soda crackers. ? Drink liquids between meals instead of during meals.  You may need to take these actions to prevent or treat constipation: ? Drink enough fluid to keep your urine pale yellow. ? Eat foods that are high in fiber, such as beans, whole grains, and fresh fruits and vegetables. ? Limit foods that are high in fat and processed sugars, such as fried or sweet foods. Activity  Exercise only as directed by your health care provider. Most people can continue their usual exercise routine during pregnancy. Try to exercise for 30 minutes at least 5 days a week.  Stop exercising if you develop pain or cramping in the lower abdomen or lower back.  Avoid exercising if it is very hot or humid or if you are at high altitude.  Avoid heavy lifting.  If you choose to, you may have sex unless your health care provider tells you not to. Relieving pain and discomfort  Wear a good support bra to relieve   breast tenderness.  Rest with your legs elevated if you have leg cramps or low back pain.  If you develop bulging veins (varicose veins) in your legs: ? Wear support hose as told by your health care provider. ? Elevate your feet for 15 minutes, 3-4 times a day. ? Limit salt in your diet. Safety  Wear your seat belt at all times when driving or riding in a car.  Talk with your health care provider if someone is verbally or physically abusive to you.  Talk with your health care provider if you are feeling sad or have  thoughts of hurting yourself. Lifestyle  Do not use hot tubs, steam rooms, or saunas.  Do not douche. Do not use tampons or scented sanitary pads.  Do not use herbal remedies, alcohol, illegal drugs, or medicines that are not approved by your health care provider. Chemicals in these products can harm your baby.  Do not use any products that contain nicotine or tobacco, such as cigarettes, e-cigarettes, and chewing tobacco. If you need help quitting, ask your health care provider.  Avoid cat litter boxes and soil used by cats. These carry germs that can cause birth defects in the baby and possibly loss of the unborn baby (fetus) by miscarriage or stillbirth. General instructions  During routine prenatal visits in the first trimester, your health care provider will do a physical exam, perform necessary tests, and ask you how things are going. Keep all follow-up visits. This is important.  Ask for help if you have counseling or nutritional needs during pregnancy. Your health care provider can offer advice or refer you to specialists for help with various needs.  Schedule a dentist appointment. At home, brush your teeth with a soft toothbrush. Floss gently.  Write down your questions. Take them to your prenatal visits. Where to find more information  American Pregnancy Association: americanpregnancy.org  American College of Obstetricians and Gynecologists: acog.org/en/Womens%20Health/Pregnancy  Office on Women's Health: womenshealth.gov/pregnancy Contact a health care provider if you have:  Dizziness.  A fever.  Mild pelvic cramps, pelvic pressure, or nagging pain in the abdominal area.  Nausea, vomiting, or diarrhea that lasts for 24 hours or longer.  A bad-smelling vaginal discharge.  Pain when you urinate.  Known exposure to a contagious illness, such as chickenpox, measles, Zika virus, HIV, or hepatitis. Get help right away if you have:  Spotting or bleeding from your  vagina.  Severe abdominal cramping or pain.  Shortness of breath or chest pain.  Any kind of trauma, such as from a fall or a car crash.  New or increased pain, swelling, or redness in an arm or leg. Summary  The first trimester of pregnancy starts on the first day of your last menstrual period until the end of week 12 (months 1 through 3).  Eating 4 or 5 small meals a day rather than 3 large meals may help to relieve nausea and vomiting.  Do not use any products that contain nicotine or tobacco, such as cigarettes, e-cigarettes, and chewing tobacco. If you need help quitting, ask your health care provider.  Keep all follow-up visits. This is important. This information is not intended to replace advice given to you by your health care provider. Make sure you discuss any questions you have with your health care provider. Document Revised: 06/22/2019 Document Reviewed: 04/28/2019 Else[employer] Patient Education  2021 [employer]   Second Trimester of Pregnancy  The second trimester of pregnancy is from week 13 through week   27. This is months 4 through 6 of pregnancy. The second trimester is often a time when you feel your best. Your body has adjusted to being pregnant, and you begin to feel better physically. During the second trimester:  Morning sickness has lessened or stopped completely.  You may have more energy.  You may have an increase in appetite. The second trimester is also a time when the unborn baby (fetus) is growing rapidly. At the end of the sixth month, the fetus may be up to 12 inches long and weigh about 1 pounds. You will likely begin to feel the baby move (quickening) between 16 and 20 weeks of pregnancy. Body changes during your second trimester Your body continues to go through many changes during your second trimester. The changes vary and generally return to normal after the baby is born. Physical changes  Your weight will continue to increase. You will  notice your lower abdomen bulging out.  You may begin to get stretch marks on your hips, abdomen, and breasts.  Your breasts will continue to grow and to become tender.  Dark spots or blotches (chloasma or mask of pregnancy) may develop on your face.  A dark line from your belly button to the pubic area (linea nigra) may appear.  You may have changes in your hair. These can include thickening of your hair, rapid growth, and changes in texture. Some people also have hair loss during or after pregnancy, or hair that feels dry or thin. Health changes  You may develop headaches.  You may have heartburn.  You may develop constipation.  You may develop hemorrhoids or swollen, bulging veins (varicose veins).  Your gums may bleed and may be sensitive to brushing and flossing.  You may urinate more often because the fetus is pressing on your bladder.  You may have back pain. This is caused by: ? Weight gain. ? Pregnancy hormones that are relaxing the joints in your pelvis. ? A shift in weight and the muscles that support your balance. Follow these instructions at home: Medicines  Follow your health care provider's instructions regarding medicine use. Specific medicines may be either safe or unsafe to take during pregnancy. Do not take any medicines unless approved by your health care provider.  Take a prenatal vitamin that contains at least 600 micrograms (mcg) of folic acid. Eating and drinking  Eat a healthy diet that includes fresh fruits and vegetables, whole grains, good sources of protein such as meat, eggs, or tofu, and low-fat dairy products.  Avoid raw meat and unpasteurized juice, milk, and cheese. These carry germs that can harm you and your baby.  You may need to take these actions to prevent or treat constipation: ? Drink enough fluid to keep your urine pale yellow. ? Eat foods that are high in fiber, such as beans, whole grains, and fresh fruits and  vegetables. ? Limit foods that are high in fat and processed sugars, such as fried or sweet foods. Activity  Exercise only as directed by your health care provider. Most people can continue their usual exercise routine during pregnancy. Try to exercise for 30 minutes at least 5 days a week. Stop exercising if you develop contractions in your uterus.  Stop exercising if you develop pain or cramping in the lower abdomen or lower back.  Avoid exercising if it is very hot or humid or if you are at a high altitude.  Avoid heavy lifting.  If you choose to, you may have   sex unless your health care provider tells you not to. Relieving pain and discomfort  Wear a supportive bra to prevent discomfort from breast tenderness.  Take warm sitz baths to soothe any pain or discomfort caused by hemorrhoids. Use hemorrhoid cream if your health care provider approves.  Rest with your legs raised (elevated) if you have leg cramps or low back pain.  If you develop varicose veins: ? Wear support hose as told by your health care provider. ? Elevate your feet for 15 minutes, 3-4 times a day. ? Limit salt in your diet. Safety  Wear your seat belt at all times when driving or riding in a car.  Talk with your health care provider if someone is verbally or physically abusive to you. Lifestyle  Do not use hot tubs, steam rooms, or saunas.  Do not douche. Do not use tampons or scented sanitary pads.  Avoid cat litter boxes and soil used by cats. These carry germs that can cause birth defects in the baby and possibly loss of the fetus by miscarriage or stillbirth.  Do not use herbal remedies, alcohol, illegal drugs, or medicines that are not approved by your health care provider. Chemicals in these products can harm your baby.  Do not use any products that contain nicotine or tobacco, such as cigarettes, e-cigarettes, and chewing tobacco. If you need help quitting, ask your health care provider. General  instructions  During a routine prenatal visit, your health care provider will do a physical exam and other tests. He or she will also discuss your overall health. Keep all follow-up visits. This is important.  Ask your health care provider for a referral to a local prenatal education class.  Ask for help if you have counseling or nutritional needs during pregnancy. Your health care provider can offer advice or refer you to specialists for help with various needs. Where to find more information  American Pregnancy Association: americanpregnancy.org  American College of Obstetricians and Gynecologists: acog.org/en/Womens%20Health/Pregnancy  Office on Women's Health: womenshealth.gov/pregnancy Contact a health care provider if you have:  A headache that does not go away when you take medicine.  Vision changes or you see spots in front of your eyes.  Mild pelvic cramps, pelvic pressure, or nagging pain in the abdominal area.  Persistent nausea, vomiting, or diarrhea.  A bad-smelling vaginal discharge or foul-smelling urine.  Pain when you urinate.  Sudden or extreme swelling of your face, hands, ankles, feet, or legs.  A fever. Get help right away if you:  Have fluid leaking from your vagina.  Have spotting or bleeding from your vagina.  Have severe abdominal cramping or pain.  Have difficulty breathing.  Have chest pain.  Have fainting spells.  Have not felt your baby move for the time period told by your health care provider.  Have new or increased pain, swelling, or redness in an arm or leg. Summary  The second trimester of pregnancy is from week 13 through week 27 (months 4 through 6).  Do not use herbal remedies, alcohol, illegal drugs, or medicines that are not approved by your health care provider. Chemicals in these products can harm your baby.  Exercise only as directed by your health care provider. Most people can continue their usual exercise routine  during pregnancy.  Keep all follow-up visits. This is important. This information is not intended to replace advice given to you by your health care provider. Make sure you discuss any questions you have with your health care   provider. Document Revised: 06/22/2019 Document Reviewed: 04/28/2019 Else[employer] Patient Education  2021 [employer]   Contraception Choices Contraception, also called birth control, refers to methods or devices that prevent pregnancy. Hormonal methods Contraceptive implant A contraceptive implant is a thin, plastic tube that contains a hormone that prevents pregnancy. It is different from an intrauterine device (IUD). It is inserted into the upper part of the arm by a health care provider. Implants can be effective for up to 3 years. Progestin-only injections Progestin-only injections are injections of progestin, a synthetic form of the hormone progesterone. They are given every 3 months by a health care provider. Birth control pills Birth control pills are pills that contain hormones that prevent pregnancy. They must be taken once a day, preferably at the same time each day. A prescription is needed to use this method of contraception. Birth control patch The birth control patch contains hormones that prevent pregnancy. It is placed on the skin and must be changed once a week for three weeks and removed on the fourth week. A prescription is needed to use this method of contraception. Vaginal ring A vaginal ring contains hormones that prevent pregnancy. It is placed in the vagina for three weeks and removed on the fourth week. After that, the process is repeated with a new ring. A prescription is needed to use this method of contraception. Emergency contraceptive Emergency contraceptives prevent pregnancy after unprotected sex. They come in pill form and can be taken up to 5 days after sex. They work best the sooner they are taken after having sex. Most emergency  contraceptives are available without a prescription. This method should not be used as your only form of birth control.   Barrier methods Female condom A female condom is a thin sheath that is worn over the penis during sex. Condoms keep sperm from going inside a woman's body. They can be used with a sperm-killing substance (spermicide) to increase their effectiveness. They should be thrown away after one use. Female condom A female condom is a soft, loose-fitting sheath that is put into the vagina before sex. The condom keeps sperm from going inside a woman's body. They should be thrown away after one use. Diaphragm A diaphragm is a soft, dome-shaped barrier. It is inserted into the vagina before sex, along with a spermicide. The diaphragm blocks sperm from entering the uterus, and the spermicide kills sperm. A diaphragm should be left in the vagina for 6-8 hours after sex and removed within 24 hours. A diaphragm is prescribed and fitted by a health care provider. A diaphragm should be replaced every 1-2 years, after giving birth, after gaining more than 15 lb (6.8 kg), and after pelvic surgery. Cervical cap A cervical cap is a round, soft latex or plastic cup that fits over the cervix. It is inserted into the vagina before sex, along with spermicide. It blocks sperm from entering the uterus. The cap should be left in place for 6-8 hours after sex and removed within 48 hours. A cervical cap must be prescribed and fitted by a health care provider. It should be replaced every 2 years. Sponge A sponge is a soft, circular piece of polyurethane foam with spermicide in it. The sponge helps block sperm from entering the uterus, and the spermicide kills sperm. To use it, you make it wet and then insert it into the vagina. It should be inserted before sex, left in for at least 6 hours after sex, and removed and thrown away   within 30 hours. Spermicides Spermicides are chemicals that kill or block sperm from  entering the cervix and uterus. They can come as a cream, jelly, suppository, foam, or tablet. A spermicide should be inserted into the vagina with an applicator at least 10-15 minutes before sex to allow time for it to work. The process must be repeated every time you have sex. Spermicides do not require a prescription.   Intrauterine contraception Intrauterine device (IUD) An IUD is a T-shaped device that is put in a woman's uterus. There are two types:  Hormone IUD.This type contains progestin, a synthetic form of the hormone progesterone. This type can stay in place for 3-5 years.  Copper IUD.This type is wrapped in copper wire. It can stay in place for 10 years. Permanent methods of contraception Female tubal ligation In this method, a woman's fallopian tubes are sealed, tied, or blocked during surgery to prevent eggs from traveling to the uterus. Hysteroscopic sterilization In this method, a small, flexible insert is placed into each fallopian tube. The inserts cause scar tissue to form in the fallopian tubes and block them, so sperm cannot reach an egg. The procedure takes about 3 months to be effective. Another form of birth control must be used during those 3 months. Female sterilization This is a procedure to tie off the tubes that carry sperm (vasectomy). After the procedure, the man can still ejaculate fluid (semen). Another form of birth control must be used for 3 months after the procedure. Natural planning methods Natural family planning In this method, a couple does not have sex on days when the woman could become pregnant. Calendar method In this method, the woman keeps track of the length of each menstrual cycle, identifies the days when pregnancy can happen, and does not have sex on those days. Ovulation method In this method, a couple avoids sex during ovulation. Symptothermal method This method involves not having sex during ovulation. The woman typically checks for  ovulation by watching changes in her temperature and in the consistency of cervical mucus. Post-ovulation method In this method, a couple waits to have sex until after ovulation. Where to find more information  Centers for Disease Control and Prevention: www.cdc.gov Summary  Contraception, also called birth control, refers to methods or devices that prevent pregnancy.  Hormonal methods of contraception include implants, injections, pills, patches, vaginal rings, and emergency contraceptives.  Barrier methods of contraception can include female condoms, female condoms, diaphragms, cervical caps, sponges, and spermicides.  There are two types of IUDs (intrauterine devices). An IUD can be put in a woman's uterus to prevent pregnancy for 3-5 years.  Permanent sterilization can be done through a procedure for males and females. Natural family planning methods involve nothaving sex on days when the woman could become pregnant. This information is not intended to replace advice given to you by your health care provider. Make sure you discuss any questions you have with your health care provider. Document Revised: 06/20/2019 Document Reviewed: 06/20/2019 Elsevier Patient Education  2021 [employer].   Breastfeeding  Choosing to breastfeed is one of the best decisions you can make for yourself and your baby. A change in hormones during pregnancy causes your breasts to make breast milk in your milk-producing glands. Hormones prevent breast milk from being released before your baby is born. They also prompt milk flow after birth. Once breastfeeding has begun, thoughts of your baby, as well as his or her sucking or crying, can stimulate the release of milk   from your milk-producing glands. Benefits of breastfeeding Research shows that breastfeeding offers many health benefits for infants and mothers. It also offers a cost-free and convenient way to feed your baby. For your baby  Your first milk  (colostrum) helps your baby's digestive system to function better.  Special cells in your milk (antibodies) help your baby to fight off infections.  Breastfed babies are less likely to develop asthma, allergies, obesity, or type 2 diabetes. They are also at lower risk for sudden infant death syndrome (SIDS).  Nutrients in breast milk are better able to meet your baby's needs compared to infant formula.  Breast milk improves your baby's brain development. For you  Breastfeeding helps to create a very special bond between you and your baby.  Breastfeeding is convenient. Breast milk costs nothing and is always available at the correct temperature.  Breastfeeding helps to burn calories. It helps you to lose the weight that you gained during pregnancy.  Breastfeeding makes your uterus return faster to its size before pregnancy. It also slows bleeding (lochia) after you give birth.  Breastfeeding helps to lower your risk of developing type 2 diabetes, osteoporosis, rheumatoid arthritis, cardiovascular disease, and breast, ovarian, uterine, and endometrial cancer later in life. Breastfeeding basics Starting breastfeeding  Find a comfortable place to sit or lie down, with your neck and back well-supported.  Place a pillow or a rolled-up blanket under your baby to bring him or her to the level of your breast (if you are seated). Nursing pillows are specially designed to help support your arms and your baby while you breastfeed.  Make sure that your baby's tummy (abdomen) is facing your abdomen.  Gently massage your breast. With your fingertips, massage from the outer edges of your breast inward toward the nipple. This encourages milk flow. If your milk flows slowly, you may need to continue this action during the feeding.  Support your breast with 4 fingers underneath and your thumb above your nipple (make the letter "C" with your hand). Make sure your fingers are well away from your nipple and  your baby's mouth.  Stroke your baby's lips gently with your finger or nipple.  When your baby's mouth is open wide enough, quickly bring your baby to your breast, placing your entire nipple and as much of the areola as possible into your baby's mouth. The areola is the colored area around your nipple. ? More areola should be visible above your baby's upper lip than below the lower lip. ? Your baby's lips should be opened and extended outward (flanged) to ensure an adequate, comfortable latch. ? Your baby's tongue should be between his or her lower gum and your breast.  Make sure that your baby's mouth is correctly positioned around your nipple (latched). Your baby's lips should create a seal on your breast and be turned out (everted).  It is common for your baby to suck about 2-3 minutes in order to start the flow of breast milk. Latching Teaching your baby how to latch onto your breast properly is very important. An improper latch can cause nipple pain, decreased milk supply, and poor weight gain in your baby. Also, if your baby is not latched onto your nipple properly, he or she may swallow some air during feeding. This can make your baby fussy. Burping your baby when you switch breasts during the feeding can help to get rid of the air. However, teaching your baby to latch on properly is still the best way to   prevent fussiness from swallowing air while breastfeeding. Signs that your baby has successfully latched onto your nipple  Silent tugging or silent sucking, without causing you pain. Infant's lips should be extended outward (flanged).  Swallowing heard between every 3-4 sucks once your milk has started to flow (after your let-down milk reflex occurs).  Muscle movement above and in front of his or her ears while sucking. Signs that your baby has not successfully latched onto your nipple  Sucking sounds or smacking sounds from your baby while breastfeeding.  Nipple pain. If you think  your baby has not latched on correctly, slip your finger into the corner of your baby's mouth to break the suction and place it between your baby's gums. Attempt to start breastfeeding again. Signs of successful breastfeeding Signs from your baby  Your baby will gradually decrease the number of sucks or will completely stop sucking.  Your baby will fall asleep.  Your baby's body will relax.  Your baby will retain a small amount of milk in his or her mouth.  Your baby will let go of your breast by himself or herself. Signs from you  Breasts that have increased in firmness, weight, and size 1-3 hours after feeding.  Breasts that are softer immediately after breastfeeding.  Increased milk volume, as well as a change in milk consistency and color by the fifth day of breastfeeding.  Nipples that are not sore, cracked, or bleeding. Signs that your baby is getting enough milk  Wetting at least 1-2 diapers during the first 24 hours after birth.  Wetting at least 5-6 diapers every 24 hours for the first week after birth. The urine should be clear or pale yellow by the age of 5 days.  Wetting 6-8 diapers every 24 hours as your baby continues to grow and develop.  At least 3 stools in a 24-hour period by the age of 5 days. The stool should be soft and yellow.  At least 3 stools in a 24-hour period by the age of 7 days. The stool should be seedy and yellow.  No loss of weight greater than 10% of birth weight during the first 3 days of life.  Average weight gain of 4-7 oz (113-198 g) per week after the age of 4 days.  Consistent daily weight gain by the age of 5 days, without weight loss after the age of 2 weeks. After a feeding, your baby may spit up a small amount of milk. This is normal. Breastfeeding frequency and duration Frequent feeding will help you make more milk and can prevent sore nipples and extremely full breasts (breast engorgement). Breastfeed when you feel the need to  reduce the fullness of your breasts or when your baby shows signs of hunger. This is called "breastfeeding on demand." Signs that your baby is hungry include:  Increased alertness, activity, or restlessness.  Movement of the head from side to side.  Opening of the mouth when the corner of the mouth or cheek is stroked (rooting).  Increased sucking sounds, smacking lips, cooing, sighing, or squeaking.  Hand-to-mouth movements and sucking on fingers or hands.  Fussing or crying. Avoid introducing a pacifier to your baby in the first 4-6 weeks after your baby is born. After this time, you may choose to use a pacifier. Research has shown that pacifier use during the first year of a baby's life decreases the risk of sudden infant death syndrome (SIDS). Allow your baby to feed on each breast as long as   he or she wants. When your baby unlatches or falls asleep while feeding from the first breast, offer the second breast. Because newborns are often sleepy in the first few weeks of life, you may need to awaken your baby to get him or her to feed. Breastfeeding times will vary from baby to baby. However, the following rules can serve as a guide to help you make sure that your baby is properly fed:  Newborns (babies 4 weeks of age or younger) may breastfeed every 1-3 hours.  Newborns should not go without breastfeeding for longer than 3 hours during the day or 5 hours during the night.  You should breastfeed your baby a minimum of 8 times in a 24-hour period. Breast milk pumping Pumping and storing breast milk allows you to make sure that your baby is exclusively fed your breast milk, even at times when you are unable to breastfeed. This is especially important if you go back to work while you are still breastfeeding, or if you are not able to be present during feedings. Your lactation consultant can help you find a method of pumping that works best for you and give you guidelines about how long it is  safe to store breast milk.      Caring for your breasts while you breastfeed Nipples can become dry, cracked, and sore while breastfeeding. The following recommendations can help keep your breasts moisturized and healthy:  Avoid using soap on your nipples.  Wear a supportive bra designed especially for nursing. Avoid wearing underwire-style bras or extremely tight bras (sports bras).  Air-dry your nipples for 3-4 minutes after each feeding.  Use only cotton bra pads to absorb leaked breast milk. Leaking of breast milk between feedings is normal.  Use lanolin on your nipples after breastfeeding. Lanolin helps to maintain your skin's normal moisture barrier. Pure lanolin is not harmful (not toxic) to your baby. You may also hand express a few drops of breast milk and gently massage that milk into your nipples and allow the milk to air-dry. In the first few weeks after giving birth, some women experience breast engorgement. Engorgement can make your breasts feel heavy, warm, and tender to the touch. Engorgement peaks within 3-5 days after you give birth. The following recommendations can help to ease engorgement:  Completely empty your breasts while breastfeeding or pumping. You may want to start by applying warm, moist heat (in the shower or with warm, water-soaked hand towels) just before feeding or pumping. This increases circulation and helps the milk flow. If your baby does not completely empty your breasts while breastfeeding, pump any extra milk after he or she is finished.  Apply ice packs to your breasts immediately after breastfeeding or pumping, unless this is too uncomfortable for you. To do this: ? Put ice in a plastic bag. ? Place a towel between your skin and the bag. ? Leave the ice on for 20 minutes, 2-3 times a day.  Make sure that your baby is latched on and positioned properly while breastfeeding. If engorgement persists after 48 hours of following these recommendations,  contact your health care provider or a lactation consultant. Overall health care recommendations while breastfeeding  Eat 3 healthy meals and 3 snacks every day. Well-nourished mothers who are breastfeeding need an additional 450-500 calories a day. You can meet this requirement by increasing the amount of a balanced diet that you eat.  Drink enough water to keep your urine pale yellow or clear.  Rest   often, relax, and continue to take your prenatal vitamins to prevent fatigue, stress, and low vitamin and mineral levels in your body (nutrient deficiencies).  Do not use any products that contain nicotine or tobacco, such as cigarettes and e-cigarettes. Your baby may be harmed by chemicals from cigarettes that pass into breast milk and exposure to secondhand smoke. If you need help quitting, ask your health care provider.  Avoid alcohol.  Do not use illegal drugs or marijuana.  Talk with your health care provider before taking any medicines. These include over-the-counter and prescription medicines as well as vitamins and herbal supplements. Some medicines that may be harmful to your baby can pass through breast milk.  It is possible to become pregnant while breastfeeding. If birth control is desired, ask your health care provider about options that will be safe while breastfeeding your baby. Where to find more information: [employer]: www.llli.org Contact a health care provider if:  You feel like you want to stop breastfeeding or have become frustrated with breastfeeding.  Your nipples are cracked or bleeding.  Your breasts are red, tender, or warm.  You have: ? Painful breasts or nipples. ? A swollen area on either breast. ? A fever or chills. ? Nausea or vomiting. ? Drainage other than breast milk from your nipples.  Your breasts do not become full before feedings by the fifth day after you give birth.  You feel sad and depressed.  Your baby is: ? Too  sleepy to eat well. ? Having trouble sleeping. ? More than 1 week old and wetting fewer than 6 diapers in a 24-hour period. ? Not gaining weight by 5 days of age.  Your baby has fewer than 3 stools in a 24-hour period.  Your baby's skin or the white parts of his or her eyes become yellow. Get help right away if:  Your baby is overly tired (lethargic) and does not want to wake up and feed.  Your baby develops an unexplained fever. Summary  Breastfeeding offers many health benefits for infant and mothers.  Try to breastfeed your infant when he or she shows early signs of hunger.  Gently tickle or stroke your baby's lips with your finger or nipple to allow the baby to open his or her mouth. Bring the baby to your breast. Make sure that much of the areola is in your baby's mouth. Offer one side and burp the baby before you offer the other side.  Talk with your health care provider or lactation consultant if you have questions or you face problems as you breastfeed. This information is not intended to replace advice given to you by your health care provider. Make sure you discuss any questions you have with your health care provider. Document Revised: 04/09/2017 Document Reviewed: 02/15/2016 Elsevier Patient Education  2021 Elsevier Inc.  

## 2020-02-14 NOTE — Progress Notes (Signed)
Subjective:    [name] is a G2P0010 [redacted]w[redacted]d being seen today for her first obstetrical visit.  Her obstetrical history is significant for history of opioid abuse currently on suboxone followed by [facility] Internal medicine. Patient does intend to breast feed. Pregnancy history fully reviewed.  Patient reports nausea controlled with compazine.  There were no vitals filed for this visit.  HISTORY: OB History  Gravida Para Term Preterm AB Living  2       1    SAB IAB Ectopic Multiple Live Births    1          # Outcome Date GA Lbr Len/2nd Weight Sex Delivery Anes PTL Lv  2 Current           1 IAB      TAB      Past Medical History:  Diagnosis Date  . ADHD (attention deficit hyperactivity disorder)   . Anesthesia complication    woke up fighting after tonsillectomy  . Anxiety   . Cholecystitis   . Complication of anesthesia    when wakes up " freaks out- like panic attack"  . Depression   . Dysmenorrhea   . GERD (gastroesophageal reflux disease)   . Hyperlipidemia   . Hypoglycemia   . Obesity   . Syncope   . Viral warts    hand  . Vision abnormalities    wears glasses   Past Surgical History:  Procedure Laterality Date  . CHOLECYSTECTOMY  03/21/2011   Procedure: LAPAROSCOPIC CHOLECYSTECTOMY;  Surgeon: Shelly Rubenstein, MD;  Location: [facility] OR;  Service: General;  Laterality: N/A;  . ESOPHAGOGASTRODUODENOSCOPY  09/26/2011   Procedure: ESOPHAGOGASTRODUODENOSCOPY (EGD);  Surgeon: Jon Gills, MD;  Location: [facility] OR;  Service: Gastroenterology;  Laterality: N/A;  . TONSILLECTOMY AND ADENOIDECTOMY  06/2005  . WISDOM TOOTH EXTRACTION     Family History  Problem Relation Age of Onset  . Anesthesia problems Maternal Grandfather   . Heart disease Maternal Grandfather   . Nephrolithiasis Maternal Grandfather   . Diabetes Maternal Grandfather   . Mental illness Maternal Grandfather   . Cholelithiasis Mother   . Nephrolithiasis Mother   . Depression Mother   .  Hypertension Mother   . Miscarriages / [place] Mother   . Anxiety disorder Mother   . Gout Father   . Nephrolithiasis Maternal Grandmother   . COPD Maternal Grandmother   . Heart disease Paternal Grandfather   . Cholelithiasis Maternal Aunt   . Depression Maternal Aunt   . Learning disabilities Maternal Aunt   . Bipolar disorder Sister      Exam    Uterus:   12-weeks  Pelvic Exam:    Perineum: Normal Perineum   Vulva: normal   Vagina:  normal mucosa, normal discharge   pH:    Cervix: nulliparous appearance and cervix is closed and long   Adnexa: no mass, fullness, tenderness   Bony Pelvis: gynecoid  System: Breast:  normal appearance, no masses or tenderness   Skin: normal coloration and turgor, no rashes    Neurologic: oriented, no focal deficits   Extremities: normal strength, tone, and muscle mass   HEENT extra ocular movement intact   Mouth/Teeth mucous membranes moist, pharynx normal without lesions and dental hygiene good   Neck supple and no masses   Cardiovascular: regular rate and rhythm   Respiratory:  appears well, vitals normal, no respiratory distress, acyanotic, normal RR, chest clear, no wheezing, crepitations, rhonchi, normal symmetric air entry  Abdomen: soft, non-tender; bowel sounds normal; no masses,  no organomegaly   Urinary:       Assessment:    Pregnancy: G2P0010 Patient Active Problem List   Diagnosis Date Noted  . Supervision of normal first pregnancy, antepartum 02/14/2020  . Severe opioid use disorder (HCC) 01/10/2020  . Polysubstance (excluding opioids) dependence (HCC) 04/11/2017  . MDD (major depressive disorder), recurrent severe, 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  . Substance induced mood disorder (HCC) 03/15/2012  . GAD (generalized anxiety disorder) 03/15/2012  . 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 ADHD (attention deficit hyperactivity disorder), combined type 03/15/2012  . Allergy history, radiographic dye 04/20/2011  .  Status post laparoscopic cholecystectomy 03/10/2011        Plan:     Initial labs drawn. Prenatal vitamins. Problem list reviewed and updated. Genetic Screening discussed : Panorama ordered.  Ultrasound discussed; fetal survey: requested.  Follow up in 4 weeks. 50% of 30 min visit spent on counseling and coordination of care.     Janziel Hockett 02/14/2020

## 2020-02-14 NOTE — Telephone Encounter (Signed)
Requesting a refill on Suboxone @ [facility].

## 2020-02-14 NOTE — Telephone Encounter (Signed)
Suboxone is not on current med list. Next appt scheduled 1/25 in th OUD clinic.

## 2020-02-15 ENCOUNTER — Encounter: Payer: Self-pay | Admitting: *Deleted

## 2020-02-15 LAB — CBC/D/PLT+RPR+RH+ABO+RUB AB...
Antibody Screen: NEGATIVE
Basophils Absolute: 0 10*3/uL (ref 0.0–0.2)
Basos: 1 %
EOS (ABSOLUTE): 0.1 10*3/uL (ref 0.0–0.4)
Eos: 3 %
HCV Ab: <0.1 s/co ratio (ref 0.0–0.9)
HIV Screen 4th Generation wRfx: NONREACTIVE
Hematocrit: 30.7 % — ABNORMAL LOW (ref 34.0–46.6)
Hemoglobin: 10.1 g/dL — ABNORMAL LOW (ref 11.1–15.9)
Hepatitis B Surface Ag: NEGATIVE
Immature Grans (Abs): 0 10*3/uL (ref 0.0–0.1)
Immature Granulocytes: 0 %
Lymphocytes Absolute: 0.9 10*3/uL (ref 0.7–3.1)
Lymphs: 28 %
MCH: 26.6 pg (ref 26.6–33.0)
MCHC: 32.9 g/dL (ref 31.5–35.7)
MCV: 81 fL (ref 79–97)
Monocytes Absolute: 0.3 10*3/uL (ref 0.1–0.9)
Monocytes: 7 %
Neutrophils Absolute: 2.1 10*3/uL (ref 1.4–7.0)
Neutrophils: 61 %
Platelets: 197 10*3/uL (ref 150–450)
RBC: 3.8 x10E6/uL (ref 3.77–5.28)
RDW: 12.4 % (ref 11.7–15.4)
RPR Ser Ql: NONREACTIVE
Rh Factor: POSITIVE
Rubella Antibodies, IGG: 9 index (ref >0.99)
WBC: 3.4 10*3/uL (ref 3.4–10.8)

## 2020-02-15 LAB — HEMOGLOBIN A1C
Est. average glucose Bld gHb Est-mCnc: 97 mg/dL
Hgb A1c MFr Bld: 5 % (ref 4.8–5.6)

## 2020-02-15 LAB — HCV INTERPRETATION

## 2020-02-16 ENCOUNTER — Other Ambulatory Visit: Payer: Self-pay | Admitting: Obstetrics and Gynecology

## 2020-02-16 DIAGNOSIS — A749 Chlamydial infection, unspecified: Secondary | ICD-10-CM | POA: Insufficient documentation

## 2020-02-16 LAB — GC/CHLAMYDIA PROBE AMP (~~LOC~~) NOT AT ARMC
Chlamydia: POSITIVE — AB
Comment: NEGATIVE
Comment: NORMAL
Neisseria Gonorrhea: NEGATIVE

## 2020-02-16 LAB — URINE CULTURE, OB REFLEX

## 2020-02-16 LAB — CULTURE, OB URINE

## 2020-02-16 MED ORDER — AZITHROMYCIN 500 MG PO TABS: 1000.0000 mg | ORAL_TABLET | Freq: Once | ORAL | 1 refills | Status: DC

## 2020-02-16 MED FILL — BUPRENORPHIN-NALOXON 8-2 MG: 8-2 | 14 days supply | Qty: 28 | Fill #0

## 2020-02-16 MED FILL — AZITHROMYCIN 500 MG TABLET: 500 | 1 days supply | Qty: 2 | Fill #0

## 2020-02-17 ENCOUNTER — Telehealth: Payer: Self-pay

## 2020-02-17 NOTE — Telephone Encounter (Addendum)
Left message for pt to return call to RN for results. Sent pt Mychart message to return call about results.   STD card faxed to [facility].

## 2020-02-17 NOTE — Telephone Encounter (Signed)
-----   Message from Catalina Antigua, MD sent at 02/16/2020  4:19 PM EST ----- Please inform patient of positive chlamydia infection. Rx e-prescribed. Her partner needs to be notified and treated as well

## 2020-02-21 ENCOUNTER — Ambulatory Visit (INDEPENDENT_AMBULATORY_CARE_PROVIDER_SITE_OTHER): Payer: Self-pay | Admitting: Student in an Organized Health Care Education/Training Program

## 2020-02-21 ENCOUNTER — Other Ambulatory Visit: Payer: Self-pay

## 2020-02-21 ENCOUNTER — Other Ambulatory Visit: Payer: Self-pay | Admitting: Student in an Organized Health Care Education/Training Program

## 2020-02-21 VITALS — BP 110/67 | HR 104 | Temp 99.5°F | Wt 155.8 lb

## 2020-02-21 DIAGNOSIS — F1120 Opioid dependence, uncomplicated: Secondary | ICD-10-CM

## 2020-02-21 DIAGNOSIS — F411 Generalized anxiety disorder: Secondary | ICD-10-CM

## 2020-02-21 MED ORDER — GABAPENTIN 100 MG PO CAPS: 100.0000 mg | ORAL_CAPSULE | Freq: Three times a day (TID) | ORAL | 1 refills | Status: DC

## 2020-02-21 MED ORDER — GABAPENTIN 100 MG PO CAPS
300.0000 mg | ORAL_CAPSULE | Freq: Three times a day (TID) | ORAL | 1 refills | Status: DC
Start: 2020-02-21 — End: 2020-02-21

## 2020-02-21 MED ORDER — PROMETHAZINE HCL 25 MG PO TABS: 25.0000 mg | ORAL_TABLET | Freq: Two times a day (BID) | ORAL | 0 refills | Status: DC | PRN

## 2020-02-21 MED ORDER — BUPRENORPHINE HCL 8 MG SL SUBL: SUBLINGUAL_TABLET | SUBLINGUAL | 1 refills | Status: DC

## 2020-02-21 MED FILL — GABAPENTIN 100 MG CAPSULE: 100 | 30 days supply | Qty: 90 | Fill #0

## 2020-02-21 MED FILL — BUPRENORPHINE HCL 8 MG SUBL: 8 | 14 days supply | Qty: 35 | Fill #0

## 2020-02-21 MED FILL — PROMETHAZINE 25 MG TABLET: 25 | 14 days supply | Qty: 28 | Fill #0

## 2020-02-21 NOTE — Assessment & Plan Note (Signed)
Mostly stable, PHQ9 score of 8 today. She is nervous about the pregnancy, but seems to have a good living situation right now. She has been prescribed gabapentin in November by psychiatry during an ED visit and noted benefit with anxiety symptoms. This is still acceptable given pregnancy. I have refilled the gabapentin 100mg  tid.

## 2020-02-21 NOTE — Addendum Note (Signed)
Addended by: Erlinda Hong T on: 02/21/2020 11:03 AM   Modules accepted: Orders

## 2020-02-21 NOTE — Assessment & Plan Note (Signed)
Doing ok on treatment since early December, subsequently found to be pregnant, now [redacted] weeks gestation and [facility]. We talked about the issues surrounding best choice of treatment for OUD during and after pregnancy. I re-iterated our commitment to her treatment, that the best thing for baby is to treat mother. We decided to switch from Suboxone product to Subutex product to avoid naloxone exposure. Will increase the dose slightly given her significant cravings to 20mg  daily (2 and one-half tabs in divided doses over the day). Urine toxassure ordered today. Database reviewed and appropriate. Follow up in 4 weeks. Medicaid should cover the subutex given pregnancy status, just need to wait for it to be activated. She has plenty of suboxone to last the rest of the week.

## 2020-02-21 NOTE — Progress Notes (Signed)
Last office visit 01/30/20  Presents for telehealth visit for her opioid use disorder.  States that she has not had any relapses and denies any symptoms of withdrawal.  She admits to being compliant with her medication but does endorse some increased cravings and periodically takes an extra half in the afternoon to help with this.    She states that she has had some body aches and cold chills that she was initially concerned for withdrawal symptoms.  She states that she was recently exposed to her grandfather who tested positive for Covid.  She states that she denies any shortness of breath, cough, change in taste or smell, rhinorrhea, or other upper respiratory symptoms.  But she does admit to fatigue, body aches, sore throat and chills.  I counseled the patient to get tested for COVID-19 and to call the clinic if she is positive.  She will need referral for monoclonal antibodies treatment if she is positive.  Furthermore, I counseled her to go to the hospital if she becomes short of breath.  Patient states that she is not taking any other medications but does smoke up to 2 to 3 cigarettes/day.  Her recent pregnancy I counseled her on the importance of cessation.  She states that she is trying to quit slowly.  Patient's last U tox was 2 weeks ago and was as expected.  We will give her another 2-week supply of Suboxone 8-2 mg tablets twice daily.  She will need a 2-week follow-up appointment in the office.  If her U tox at that time is appropriate, she will need to be scheduled for 1 month follow-up.  Plan: -Refill Suboxone 8-2 mg tablets twice daily.  2-week supply -Follow-up with in person visit in 2 weeks -U tox in 2 weeks  PDMP: last filled on 1/20 for 14 days

## 2020-02-21 NOTE — Progress Notes (Signed)
   Assessment and Plan:  See Encounters tab for problem-based medical decision making.   __________________________________________________________  HPI:   25 year old person, now [redacted] weeks pregnant, living with OUD here for office based OUD treatment. Reports significant cravings throughout the week, rates at a level 8. Denies any relapses. She found out she was pregnant at the end of December and [facility]. Says the OB was supportive of her continuing with OUD treatment. She is worried about relapse if her cravings remain this bad. She denies any withdrawal. Reports good technique with sublingual administration. Living with her mother right now, no drugs in the home, says it is a safe and supportive environment. No recent fevers or chills. Having nausea related to the pregnancy, found phenergan helpful from prior ED visit.   __________________________________________________________  Problem List: Patient Active Problem List   Diagnosis Date Noted  . Severe opioid use disorder (HCC) 01/10/2020    Priority: High  . GAD (generalized anxiety disorder) 03/15/2012    Priority: Medium  . Chlamydia infection affecting pregnancy 02/16/2020  . Supervision of normal first pregnancy, antepartum 02/14/2020  . Substance abuse affecting pregnancy, antepartum 02/14/2020  . MDD (major depressive disorder), recurrent severe, 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    Medications: Reconciled today in Epic __________________________________________________________  Physical Exam:  Vital Signs: Vitals:   02/21/20 0931 02/21/20 0932  BP: 110/67   Pulse: (!) 104   Temp:  99.5 F (37.5 C)  SpO2: 98%   Weight: 155 lb 12.8 oz (70.7 kg)     Gen: Well appearing, NAD Neck: No cervical LAD, No thyromegaly or nodules CV: RRR, no murmurs Pulm: Normal effort, CTA throughout, no wheezing Ext: Warm, no edema, normal joints

## 2020-02-22 ENCOUNTER — Encounter: Payer: Self-pay | Admitting: General Practice

## 2020-02-22 NOTE — Telephone Encounter (Signed)
Called patient and message stated call couldn't be completed at this time. Called and left message at emergency contact number. Will send letter.

## 2020-02-27 ENCOUNTER — Encounter: Payer: Self-pay | Admitting: General Practice

## 2020-02-29 LAB — TOXASSURE SELECT,+ANTIDEPR,UR

## 2020-03-01 ENCOUNTER — Telehealth: Payer: Self-pay | Admitting: Obstetrics and Gynecology

## 2020-03-01 NOTE — Telephone Encounter (Signed)
Patient is requesting a call back today. She would like her results.

## 2020-03-02 NOTE — BH Specialist Note (Deleted)
Integrated Behavioral Health via Telemedicine Visit  03/02/2020 [name] [id number]  Number of Integrated Behavioral Health visits: 1 Session Start time: 8:15***  Session End time: 9:15*** Total time: {IBH Total XBWI:20355974}  Referring Provider: Catalina Antigua, MD Patient/Family location: Home*** [facility] Provider location: Center for [facility]  All persons participating in visit: Patient *** and [facility] Lashun Mccants ***  Types of Service: {CHL AMB TYPE OF [phone]}  I connected with [name] and/or Coolidge Breeze Baumgarten's {family members:20773} by Telephone  (Video is Caregility application) and verified that I am speaking with the correct person using two identifiers.Discussed confidentiality: Yes   I discussed the limitations of telemedicine and the availability of in person appointments.  Discussed there is a possibility of technology failure and discussed alternative modes of communication if that failure occurs.  I discussed that engaging in this telemedicine visit, they consent to the provision of behavioral healthcare and the services will be billed under their insurance.  Patient and/or legal guardian expressed understanding and consented to Telemedicine visit: Yes   Presenting Concerns: Patient and/or family reports the following symptoms/concerns: *** Duration of problem: ***; Severity of problem: {Mild/Moderate/Severe:20260}  Patient and/or Family's Strengths/Protective Factors: {CHL AMB BH PROTECTIVE FACTORS:5703143629}  Goals Addressed: Patient will: 1.  Reduce symptoms of: {IBH Symptoms:21014056}  2.  Increase knowledge and/or ability of: {IBH Patient Tools:21014057}  3.  Demonstrate ability to: {IBH Goals:21014053}  Progress towards Goals: {CHL AMB BH PROGRESS TOWARDS [phone]}  Interventions: Interventions utilized:  {IBH Interventions:21014054} Standardized Assessments completed: {IBH Screening  Tools:21014051}  Patient and/or Family Response: ***  Assessment: Patient currently experiencing ***.   Patient may benefit from ***.  Plan: 1. Follow up with behavioral health clinician on : *** 2. Behavioral recommendations: *** 3. Referral(s): {IBH Referrals:21014055}  I discussed the assessment and treatment plan with the patient and/or parent/guardian. They were provided an opportunity to ask questions and all were answered. They agreed with the plan and demonstrated an understanding of the instructions.   They were advised to call back or seek an in-person evaluation if the symptoms worsen or if the condition fails to improve as anticipated.  Valetta Close Titilayo Hagans, LCSW

## 2020-03-05 ENCOUNTER — Other Ambulatory Visit: Payer: Self-pay | Admitting: Student in an Organized Health Care Education/Training Program

## 2020-03-05 NOTE — Telephone Encounter (Signed)
Patient called and wanted her results of her Genetic Screening. Panorama results are in the chart. She is not able to get into My Chart. She was given the results that infant is a boy. Patient excited.

## 2020-03-06 ENCOUNTER — Other Ambulatory Visit: Payer: Self-pay | Admitting: *Deleted

## 2020-03-06 ENCOUNTER — Other Ambulatory Visit: Payer: Self-pay | Admitting: Internal Medicine

## 2020-03-06 ENCOUNTER — Telehealth: Payer: Self-pay

## 2020-03-06 MED ORDER — PROMETHAZINE HCL 25 MG PO TABS: ORAL_TABLET | ORAL | 0 refills | Status: DC

## 2020-03-06 MED FILL — BUPRENORPHINE HCL 8 MG SUBL: 8 | 14 days supply | Qty: 35 | Fill #1

## 2020-03-06 MED FILL — PROMETHAZINE 25 MG TABLET: 25 | 14 days supply | Qty: 28 | Fill #0

## 2020-03-06 NOTE — Telephone Encounter (Signed)
Call [facility] requesting to re-send Promethazine rx with "IM Program". Thanks

## 2020-03-10 ENCOUNTER — Inpatient Hospital Stay (HOSPITAL_COMMUNITY)
Admission: AD | Admit: 2020-03-10 | Discharge: 2020-03-10 | Disposition: A | Discharge status: Home / Self Care | Payer: Medicaid Other | Attending: Obstetrics and Gynecology | Admitting: Obstetrics and Gynecology

## 2020-03-10 ENCOUNTER — Encounter (HOSPITAL_COMMUNITY): Payer: Self-pay | Admitting: Obstetrics and Gynecology

## 2020-03-10 ENCOUNTER — Other Ambulatory Visit: Payer: Self-pay

## 2020-03-10 DIAGNOSIS — F17210 Nicotine dependence, cigarettes, uncomplicated: Secondary | ICD-10-CM | POA: Diagnosis not present

## 2020-03-10 DIAGNOSIS — O99612 Diseases of the digestive system complicating pregnancy, second trimester: Secondary | ICD-10-CM | POA: Diagnosis not present

## 2020-03-10 DIAGNOSIS — Z3400 Encounter for supervision of normal first pregnancy, unspecified trimester: Secondary | ICD-10-CM

## 2020-03-10 DIAGNOSIS — Z885 Allergy status to narcotic agent status: Secondary | ICD-10-CM | POA: Diagnosis not present

## 2020-03-10 DIAGNOSIS — O99332 Smoking (tobacco) complicating pregnancy, second trimester: Secondary | ICD-10-CM | POA: Diagnosis not present

## 2020-03-10 DIAGNOSIS — O99891 Other specified diseases and conditions complicating pregnancy: Secondary | ICD-10-CM

## 2020-03-10 DIAGNOSIS — R109 Unspecified abdominal pain: Secondary | ICD-10-CM | POA: Diagnosis present

## 2020-03-10 DIAGNOSIS — Z3A15 15 weeks gestation of pregnancy: Secondary | ICD-10-CM | POA: Diagnosis not present

## 2020-03-10 DIAGNOSIS — K5900 Constipation, unspecified: Secondary | ICD-10-CM | POA: Diagnosis not present

## 2020-03-10 DIAGNOSIS — O99320 Drug use complicating pregnancy, unspecified trimester: Secondary | ICD-10-CM

## 2020-03-10 LAB — CBC
HCT: 36.2 % (ref 36.0–46.0)
Hemoglobin: 11.7 g/dL — ABNORMAL LOW (ref 12.0–15.0)
MCH: 26.8 pg (ref 26.0–34.0)
MCHC: 32.3 g/dL (ref 30.0–36.0)
MCV: 82.8 fL (ref 80.0–100.0)
Platelets: 191 10*3/uL (ref 150–400)
RBC: 4.37 MIL/uL (ref 3.87–5.11)
RDW: 14.3 % (ref 11.5–15.5)
WBC: 5.3 10*3/uL (ref 4.0–10.5)
nRBC: 0 % (ref 0.0–0.2)

## 2020-03-10 LAB — COMPREHENSIVE METABOLIC PANEL
ALT: 14 U/L (ref 0–44)
AST: 16 U/L (ref 15–41)
Albumin: 3.7 g/dL (ref 3.5–5.0)
Alkaline Phosphatase: 53 U/L (ref 38–126)
Anion gap: 10 (ref 5–15)
BUN: 8 mg/dL (ref 6–20)
CO2: 22 mmol/L (ref 22–32)
Calcium: 8.9 mg/dL (ref 8.9–10.3)
Chloride: 104 mmol/L (ref 98–111)
Creatinine, Ser: 0.58 mg/dL (ref 0.44–1.00)
GFR, Estimated: >60 mL/min (ref >60)
Glucose, Bld: 87 mg/dL (ref 70–99)
Potassium: 3.9 mmol/L (ref 3.5–5.1)
Sodium: 136 mmol/L (ref 135–145)
Total Bilirubin: 1.1 mg/dL (ref 0.3–1.2)
Total Protein: 6.6 g/dL (ref 6.5–8.1)

## 2020-03-10 LAB — URINALYSIS, ROUTINE W REFLEX MICROSCOPIC
Bilirubin Urine: NEGATIVE
Glucose, UA: NEGATIVE mg/dL
Hgb urine dipstick: NEGATIVE
Ketones, ur: 5 mg/dL — AB
Nitrite: NEGATIVE
Protein, ur: NEGATIVE mg/dL
Specific Gravity, Urine: 1.026 (ref 1.005–1.030)
pH: 5 (ref 5.0–8.0)

## 2020-03-10 LAB — LIPASE, BLOOD: Lipase: 42 U/L (ref 11–51)

## 2020-03-10 MED ORDER — DOCUSATE SODIUM 100 MG PO CAPS: 100.0000 mg | ORAL_CAPSULE | Freq: Two times a day (BID) | ORAL | 0 refills | Status: AC

## 2020-03-10 MED ORDER — LACTATED RINGERS IV BOLUS
1000.0000 mL | Freq: Once | INTRAVENOUS | Status: AC
  Administered 2020-03-10: 1000 mL via INTRAVENOUS

## 2020-03-10 MED ORDER — PROMETHAZINE HCL 25 MG PO TABS: ORAL_TABLET | ORAL | 1 refills | Status: DC

## 2020-03-10 MED ORDER — CYCLOBENZAPRINE HCL 5 MG PO TABS
10.0000 mg | ORAL_TABLET | Freq: Once | ORAL | Status: AC
  Administered 2020-03-10: 10 mg via ORAL
  Filled 2020-03-10: qty 2

## 2020-03-10 MED ORDER — POLYETHYLENE GLYCOL 3350 17 G PO PACK: 17.0000 g | PACK | Freq: Every day | ORAL | 0 refills | Status: DC

## 2020-03-10 MED ORDER — ACETAMINOPHEN 500 MG PO TABS
1000.0000 mg | ORAL_TABLET | Freq: Once | ORAL | Status: AC
  Administered 2020-03-10: 1000 mg via ORAL
  Filled 2020-03-10: qty 2

## 2020-03-10 NOTE — Discharge Instructions (Signed)
Abdominal Pain During Pregnancy Belly (abdominal) pain is common during pregnancy. There are many possible causes. Some causes are more serious than others. Sometimes the cause is not known. Always tell your doctor if you have belly pain. Follow these instructions at home:  Do not have sex or put anything in your vagina until your pain goes away completely.  Get plenty of rest until your pain gets better.  Drink enough fluid to keep your pee (urine) pale yellow.  Take over-the-counter and prescription medicines only as told by your doctor.  Keep all follow-up visits.   Contact a doctor if:  You keep having pain after resting.  Your pain gets worse after resting.  You have lower belly pain that: ? Comes and goes at regular times. ? Spreads to your back. ? Feels like menstrual cramps.  You have pain or burning when you pee (urinate). Get help right away if:  You have a fever or chills.  You feel like it is hard to breathe.  You have bleeding from your vagina.  You are leaking fluid or tissue from your vagina.  You vomit for more than 24 hours.  You have watery poop (diarrhea) for more than 24 hours.  Your baby is moving less than usual.  You feel very weak or faint.  You have very bad pain in your upper belly. Summary  Belly pain is common during pregnancy. There are many possible causes.  If you have belly pain during pregnancy, tell your doctor right away.  Keep all follow-up visits. This information is not intended to replace advice given to you by your health care provider. Make sure you discuss any questions you have with your health care provider. Document Revised: 09/27/2019 Document Reviewed: 09/27/2019 Elsevier Patient Education  2021 Elsevier Inc.   Constipation, Adult Constipation is when a person has trouble pooping (having a bowel movement). When you have this condition, you may poop fewer than 3 times a week. Your poop (stool) may also be dry,  hard, or bigger than normal. Follow these instructions at home: Eating and drinking  Eat foods that have a lot of fiber, such as: ? Fresh fruits and vegetables. ? Whole grains. ? Beans.  Eat less of foods that are low in fiber and high in fat and sugar, such as: ? [place] fries. ? Hamburgers. ? Cookies. ? Candy. ? Soda.  Drink enough fluid to keep your pee (urine) pale yellow.   General instructions  Exercise regularly or as told by your doctor. Try to do 150 minutes of exercise each week.  Go to the restroom when you feel like you need to poop. Do not hold it in.  Take over-the-counter and prescription medicines only as told by your doctor. These include any fiber supplements.  When you poop: ? Do deep breathing while relaxing your lower belly (abdomen). ? Relax your pelvic floor. The pelvic floor is a group of muscles that support the rectum, bladder, and intestines (as well as the uterus in women).  Watch your condition for any changes. Tell your doctor if you notice any.  Keep all follow-up visits as told by your doctor. This is important. Contact a doctor if:  You have pain that gets worse.  You have a fever.  You have not pooped for 4 days.  You vomit.  You are not hungry.  You lose weight.  You are bleeding from the opening of the butt (anus).  You have thin, pencil-like poop. Get help right  away if:  You have a fever, and your symptoms suddenly get worse.  You leak poop or have blood in your poop.  Your belly feels hard or bigger than normal (bloated).  You have very bad belly pain.  You feel dizzy or you faint. Summary  Constipation is when a person poops fewer than 3 times a week, has trouble pooping, or has poop that is dry, hard, or bigger than normal.  Eat foods that have a lot of fiber.  Drink enough fluid to keep your pee (urine) pale yellow.  Take over-the-counter and prescription medicines only as told by your doctor. These include  any fiber supplements. This information is not intended to replace advice given to you by your health care provider. Make sure you discuss any questions you have with your health care provider. Document Revised: 12/01/2018 Document Reviewed: 12/01/2018 Elsevier Patient Education  2021 [employer].

## 2020-03-10 NOTE — MAU Note (Signed)
Pt woke up this morning with some mild abdominal pain and took a warm shower and went back to bed.  Woke up at 1000 with worsening pain she rates a 10/10.  Denies vaginal bleeding or LOF.  Called EMS because she was hurting so bad.

## 2020-03-10 NOTE — MAU Provider Note (Signed)
History     CSN: [id number]  Arrival date and time: 03/10/20 1115  Event Date/Time  First Provider Initiated Contact with Patient 03/10/20 1312     Chief Complaint  Patient presents with  . Abdominal Pain   HPI [name] is a 25 y.o. G2P0010 at 93w0dwho presents to MAU via EMS with chief complaint of abdominal pain. This is a new problem, onset about one hour ago. Patient's pain is periumbilical and does not radiate. Pain score 10/10. She has not taken medication or tried other treatments for this complaint.  Patient's pregnancy is c/b recurrent constipation. She has attempted management with Colace and laxatives but has not experienced relief. She denies hemorrhoids. She denies lower abdominal pain, vaginal bleeding, dysuria, fever or recent illness.  Patient's pregnancy also c/b Opioid Use Disorder. Her Subutex is managed by Internal Medicine, most recently seen in office end of January 2022.  Patient receives prenatal care with [facility].  OB History    Gravida  2   Para  0   Term  0   Preterm  0   AB  1   Living  0     SAB  0   IAB  1   Ectopic  0   Multiple  0   Live Births  0           Past Medical History:  Diagnosis Date  . ADHD (attention deficit hyperactivity disorder)   . Anesthesia complication    woke up fighting after tonsillectomy  . Anxiety   . Cholecystitis   . Complication of anesthesia    when wakes up " freaks out- like panic attack"  . Depression   . Dysmenorrhea   . GERD (gastroesophageal reflux disease)   . Hyperlipidemia   . Hypoglycemia   . Obesity   . Syncope   . Viral warts    hand  . Vision abnormalities    wears glasses    Past Surgical History:  Procedure Laterality Date  . ADENOIDECTOMY    . CHOLECYSTECTOMY  03/21/2011   Procedure: LAPAROSCOPIC CHOLECYSTECTOMY;  Surgeon: DHarl Bowie MD;  [place] [facility]  Service: General;  Laterality: N/A;  . ESOPHAGOGASTRODUODENOSCOPY  09/26/2011    Procedure: ESOPHAGOGASTRODUODENOSCOPY (EGD);  Surgeon: JOletha Blend MD;  [place] [facility]  Service: Gastroenterology;  Laterality: N/A;  . TONSILLECTOMY AND ADENOIDECTOMY  06/2005  . WISDOM TOOTH EXTRACTION      Family History  Problem Relation Age of Onset  . Anesthesia problems Maternal Grandfather   . Heart disease Maternal Grandfather   . Nephrolithiasis Maternal Grandfather   . Diabetes Maternal Grandfather   . Mental illness Maternal Grandfather   . Cholelithiasis Mother   . Nephrolithiasis Mother   . Depression Mother   . Hypertension Mother   . Miscarriages / [place]   . Anxiety disorder Mother   . Gout Father   . Nephrolithiasis Maternal Grandmother   . COPD Maternal Grandmother   . Heart disease Paternal Grandfather   . Cholelithiasis Maternal Aunt   . Depression Maternal Aunt   . Learning disabilities Maternal Aunt   . Bipolar disorder Sister     Social History   Tobacco Use  . Smoking status: Current Every Day Smoker    Packs/day: 0.50    Years: 4.00    Pack years: 2.00    Types: Cigarettes  . Smokeless tobacco: Never Used  . Tobacco comment: 0.5 PPD  Vaping Use  .  Vaping Use: Never used  Substance Use Topics  . Alcohol use: Not Currently    Comment: socially  . Drug use: Not Currently    Types: Marijuana, Heroin, Benzodiazepines    Comment: last used 11/2019     Allergies:  Allergies  Allergen Reactions  . Blueberry Fruit Extract Anaphylaxis  . Contrast Media [Iodinated Diagnostic Agents] Anaphylaxis and Rash  . Codeine Hives, Itching and Nausea And Vomiting  . Omnipaque [Iohexol] Itching, Nausea And Vomiting and Swelling    Medications Prior to Admission  Medication Sig Dispense Refill Last Dose  . buprenorphine (SUBUTEX) 8 MG SUBL SL tablet Use two and one-half tablets per day 35 tablet 1 03/10/2020 at Unknown time  . gabapentin (NEURONTIN) 100 MG capsule Take 1 capsule (100 mg total) by mouth 3 (three) times daily. 90 capsule 1  03/09/2020 at Unknown time  . Prenat-Fe Poly-Methfol-FA-DHA (VITAFOL ULTRA) 29-0.6-0.4-200 MG CAPS Take 1 tablet by mouth daily. 30 capsule 12 03/09/2020 at Unknown time  . UNKNOWN TO PATIENT Patient reports taking 3 laxatives around 1130pm 03/09/20   03/09/2020 at Unknown time  . [DISCONTINUED] docusate sodium (COLACE) 100 MG capsule Take 200 mg by mouth once.   03/09/2020 at Unknown time  . [DISCONTINUED] promethazine (PHENERGAN) 25 MG tablet TAKE 1 TABLET BY MOUTH TWICE DAILY AS NEEDED FOR NAUSEA. IF NOT TOLERATED BY MOUTH, YOU CAN PLACE THEM IN THE VAGINA OR RECTUM INSTEAD. 28 tablet 0 03/09/2020 at Unknown time  . Blood Pressure Monitoring (BLOOD PRESSURE KIT) DEVI 1 Device by Does not apply route as needed. 1 each 0   . prenatal vitamin w/FE, FA (PRENATAL 1 + 1) 27-1 MG TABS tablet Take 1 tablet by mouth daily at 12 noon.       Review of Systems  Gastrointestinal: Positive for abdominal pain.  Genitourinary: Negative for vaginal bleeding.  Musculoskeletal: Negative for back pain.  All other systems reviewed and are negative.  Physical Exam   Blood pressure (!) 105/43, pulse 81, temperature 97.6 F (36.4 C), temperature source Oral, resp. rate 16, last menstrual period 11/26/2019, SpO2 100 %.  Physical Exam Vitals and nursing note reviewed. Exam conducted with a chaperone present.  Constitutional:      Appearance: She is well-developed.  Cardiovascular:     Rate and Rhythm: Normal rate.     Heart sounds: Normal heart sounds.  Pulmonary:     Effort: Pulmonary effort is normal.     Breath sounds: Normal breath sounds.  Abdominal:     General: Bowel sounds are normal.     Palpations: Abdomen is soft.     Tenderness: There is no abdominal tenderness. There is no right CVA tenderness, left CVA tenderness, guarding or rebound.    Skin:    General: Skin is warm.     Capillary Refill: Capillary refill takes less than 2 seconds.  Neurological:     Mental Status: She is alert and  oriented to person, place, and time.  Psychiatric:        Mood and Affect: Mood normal.        Behavior: Behavior normal.     MAU Course  Procedures   --Urine sample provided is tea-colored/amber. Discussed that constipation is related to medications, low activity level, low hydration. Important to manage these variable in addition to taking Colace.  --Enema initially deferred per patient stated preference. Discussed my concern that patient has anxiety about discomfort with bowel movement. Encouraged enema in MAU. Pt agreeable. Large bowel movement in  MAU. Patient endorses relief and resolution of periumbilical pain. Endorses residual rectal pressure.  Orders Placed This Encounter  Procedures  . Urinalysis, Routine w reflex microscopic  . CBC  . Comprehensive metabolic panel  . Lipase, blood  . Soap suds enema  . Blood draw with IV start  . Discharge patient   Results for orders placed or performed during the hospital encounter of 03/10/20 (from the past 24 hour(s))  Urinalysis, Routine w reflex microscopic Urine, Clean Catch     Status: Abnormal   Collection Time: 03/10/20 11:40 AM  Result Value Ref Range   Color, Urine AMBER (A) YELLOW   APPearance CLOUDY (A) CLEAR   Specific Gravity, Urine 1.026 1.005 - 1.030   pH 5.0 5.0 - 8.0   Glucose, UA NEGATIVE NEGATIVE mg/dL   Hgb urine dipstick NEGATIVE NEGATIVE   Bilirubin Urine NEGATIVE NEGATIVE   Ketones, ur 5 (A) NEGATIVE mg/dL   Protein, ur NEGATIVE NEGATIVE mg/dL   Nitrite NEGATIVE NEGATIVE   Leukocytes,Ua TRACE (A) NEGATIVE   RBC / HPF 0-5 0 - 5 RBC/hpf   WBC, UA 6-10 0 - 5 WBC/hpf   Bacteria, UA RARE (A) NONE SEEN   Squamous Epithelial / LPF 6-10 0 - 5   Mucus PRESENT    Ca Oxalate Crys, UA PRESENT   CBC     Status: Abnormal   Collection Time: 03/10/20 12:19 PM  Result Value Ref Range   WBC 5.3 4.0 - 10.5 K/uL   RBC 4.37 3.87 - 5.11 MIL/uL   Hemoglobin 11.7 (L) 12.0 - 15.0 g/dL   HCT 36.2 36.0 - 46.0 %   MCV 82.8  80.0 - 100.0 fL   MCH 26.8 26.0 - 34.0 pg   MCHC 32.3 30.0 - 36.0 g/dL   RDW 14.3 11.5 - 15.5 %   Platelets 191 150 - 400 K/uL   nRBC 0.0 0.0 - 0.2 %  Comprehensive metabolic panel     Status: None   Collection Time: 03/10/20 12:19 PM  Result Value Ref Range   Sodium 136 135 - 145 mmol/L   Potassium 3.9 3.5 - 5.1 mmol/L   Chloride 104 98 - 111 mmol/L   CO2 22 22 - 32 mmol/L   Glucose, Bld 87 70 - 99 mg/dL   BUN 8 6 - 20 mg/dL   Creatinine, Ser 0.58 0.44 - 1.00 mg/dL   Calcium 8.9 8.9 - 10.3 mg/dL   Total Protein 6.6 6.5 - 8.1 g/dL   Albumin 3.7 3.5 - 5.0 g/dL   AST 16 15 - 41 U/L   ALT 14 0 - 44 U/L   Alkaline Phosphatase 53 38 - 126 U/L   Total Bilirubin 1.1 0.3 - 1.2 mg/dL   GFR, Estimated >60 >60 mL/min   Anion gap 10 5 - 15  Lipase, blood     Status: None   Collection Time: 03/10/20 12:19 PM  Result Value Ref Range   Lipase 42 11 - 51 U/L   Meds ordered this encounter  Medications  . acetaminophen (TYLENOL) tablet 1,000 mg  . cyclobenzaprine (FLEXERIL) tablet 10 mg  . lactated ringers bolus 1,000 mL  . docusate sodium (COLACE) 100 MG capsule    Sig: Take 1 capsule (100 mg total) by mouth 2 (two) times daily.    Dispense:  60 capsule    Refill:  0    Order Specific Question:   Supervising Provider    Answer:   Lynnda Shields A [[id number]]  . promethazine (  PHENERGAN) 25 MG tablet    Sig: TAKE 1 TABLET BY MOUTH TWICE DAILY AS NEEDED FOR NAUSEA. IF NOT TOLERATED BY MOUTH, YOU CAN PLACE THEM IN THE VAGINA OR RECTUM INSTEAD.    Dispense:  28 tablet    Refill:  1    IM Program    Order Specific Question:   Supervising Provider    Answer:   Lynnda Shields A [[id number]]  . polyethylene glycol (MIRALAX) 17 g packet    Sig: Take 17 g by mouth daily.    Dispense:  14 each    Refill:  0    Order Specific Question:   Supervising Provider    Answer:   Griffin Basil [[id number]]   Assessment and Plan  --25 y.o. G2P0010 at [redacted]w[redacted]d --[place] by Doppler --Abdominal pain r/t  constipation --S/p large bowel movement in MAU --New outpatient regimen and diet recommendations for constipation --Denies pain prior to discharge from MAU --Discharge home in stable condition  F/U: --[facility]  SDarlina Rumpf CPrentice2/12/2020, 7:02 PM

## 2020-03-13 ENCOUNTER — Other Ambulatory Visit: Payer: Self-pay | Admitting: Family Medicine

## 2020-03-13 ENCOUNTER — Telehealth (INDEPENDENT_AMBULATORY_CARE_PROVIDER_SITE_OTHER): Payer: Self-pay | Admitting: Family Medicine

## 2020-03-13 DIAGNOSIS — O98812 Other maternal infectious and parasitic diseases complicating pregnancy, second trimester: Secondary | ICD-10-CM

## 2020-03-13 DIAGNOSIS — Z3A15 15 weeks gestation of pregnancy: Secondary | ICD-10-CM

## 2020-03-13 DIAGNOSIS — Z3400 Encounter for supervision of normal first pregnancy, unspecified trimester: Secondary | ICD-10-CM

## 2020-03-13 DIAGNOSIS — F1120 Opioid dependence, uncomplicated: Secondary | ICD-10-CM

## 2020-03-13 DIAGNOSIS — O99322 Drug use complicating pregnancy, second trimester: Secondary | ICD-10-CM

## 2020-03-13 DIAGNOSIS — F332 Major depressive disorder, recurrent severe without psychotic features: Secondary | ICD-10-CM

## 2020-03-13 DIAGNOSIS — O99342 Other mental disorders complicating pregnancy, second trimester: Secondary | ICD-10-CM

## 2020-03-13 DIAGNOSIS — O99320 Drug use complicating pregnancy, unspecified trimester: Secondary | ICD-10-CM

## 2020-03-13 DIAGNOSIS — A749 Chlamydial infection, unspecified: Secondary | ICD-10-CM

## 2020-03-13 MED ORDER — PROMETHAZINE HCL 25 MG PO TABS: ORAL_TABLET | ORAL | 1 refills | Status: DC

## 2020-03-13 NOTE — Progress Notes (Signed)
Pt states has not picked up BP cuff at pharmacy.   Pt is scheduled for MFM [place] for anatomy and growth on 3/15@ 1245pm.

## 2020-03-13 NOTE — Progress Notes (Signed)
Called pt on given number at 1:24pm  x 1 and left message to connect to video for appt. Will try again if no answer.

## 2020-03-13 NOTE — Progress Notes (Signed)
    TELEHEALTH OBSTETRICS VISIT ENCOUNTER NOTE  Provider location: [facility] [employer] [facility] [employer]   I connected with [name] on 03/13/20 at  1:35 PM EST by telephone at home and verified that I am speaking with the correct person using two identifiers. Of note, unable to do video encounter due to technical difficulties.    I discussed the limitations, risks, security and privacy concerns of performing an evaluation and management service by telephone and the availability of in person appointments. I also discussed with the patient that there may be a patient responsible charge related to this service. The patient expressed understanding and agreed to proceed.  Subjective:  [name] is a 25 y.o. G2P0010 at [redacted]w[redacted]d being followed for ongoing prenatal care.  She is currently monitored for the following issues for this high-risk pregnancy and has GAD (generalized anxiety disorder); MDD (major depressive disorder), recurrent severe, without psychosis (HCC); Severe opioid use disorder (HCC); Supervision of normal first pregnancy, antepartum; Substance abuse affecting pregnancy, antepartum; and Chlamydia infection affecting pregnancy on their problem list.  Patient reports nausea. Reports fetal movement. Denies any contractions, bleeding or leaking of fluid.   The following portions of the patient's history were reviewed and updated as appropriate: allergies, current medications, past family history, past medical history, past social history, past surgical history and problem list.   Objective:  Last menstrual period 11/26/2019. General:  Alert, oriented and cooperative.   Mental Status: Normal mood and affect perceived. Normal judgment and thought content.  Rest of physical exam deferred due to type of encounter  Assessment and Plan:  Pregnancy: G2P0010 at [redacted]w[redacted]d 1. Supervision of normal first pregnancy, antepartum Some ongoing nausea relieved by phenergan, refill  sent Discussed AFP at next visit Anatomy scan scheduled  2. Severe opioid use disorder (HCC) Follows w IM clinic Switched to Subutex, discussed with her that while the effects of naloxone have not been definitively proven, numerous studies have shown no adverse effects If she would like to revert to suboxone I told her we would have no issue with that Will message IM clinic providers as well  3. MDD (major depressive disorder), recurrent severe, without psychosis (HCC)   4. Chlamydia infection affecting pregnancy in second trimester Aware and took azithromycin, TOC at next visit  5. Substance abuse affecting pregnancy, antepartum   Preterm labor symptoms and general obstetric precautions including but not limited to vaginal bleeding, contractions, leaking of fluid and fetal movement were reviewed in detail with the patient.  I discussed the assessment and treatment plan with the patient. The patient was provided an opportunity to ask questions and all were answered. The patient agreed with the plan and demonstrated an understanding of the instructions. The patient was advised to call back or seek an in-person office evaluation/go to MAU at [facility] for any urgent or concerning symptoms. Please refer to After Visit Summary for other counseling recommendations.   I provided 12 minutes of non-face-to-face time during this encounter.  Return in 4 weeks (on 04/10/2020).  Future Appointments  Date Time Provider Department Center  03/20/2020 10:15 AM IMP-OUD [facility]    Venora Maples, MD Center for [facility], [facility]

## 2020-03-13 NOTE — Patient Instructions (Signed)
 Contraception Choices Contraception, also called birth control, refers to methods or devices that prevent pregnancy. Hormonal methods Contraceptive implant A contraceptive implant is a thin, plastic tube that contains a hormone that prevents pregnancy. It is different from an intrauterine device (IUD). It is inserted into the upper part of the arm by a health care provider. Implants can be effective for up to 3 years. Progestin-only injections Progestin-only injections are injections of progestin, a synthetic form of the hormone progesterone. They are given every 3 months by a health care provider. Birth control pills Birth control pills are pills that contain hormones that prevent pregnancy. They must be taken once a day, preferably at the same time each day. A prescription is needed to use this method of contraception. Birth control patch The birth control patch contains hormones that prevent pregnancy. It is placed on the skin and must be changed once a week for three weeks and removed on the fourth week. A prescription is needed to use this method of contraception. Vaginal ring A vaginal ring contains hormones that prevent pregnancy. It is placed in the vagina for three weeks and removed on the fourth week. After that, the process is repeated with a new ring. A prescription is needed to use this method of contraception. Emergency contraceptive Emergency contraceptives prevent pregnancy after unprotected sex. They come in pill form and can be taken up to 5 days after sex. They work best the sooner they are taken after having sex. Most emergency contraceptives are available without a prescription. This method should not be used as your only form of birth control.   Barrier methods Female condom A female condom is a thin sheath that is worn over the penis during sex. Condoms keep sperm from going inside a woman's body. They can be used with a sperm-killing substance (spermicide) to increase their  effectiveness. They should be thrown away after one use. Female condom A female condom is a soft, loose-fitting sheath that is put into the vagina before sex. The condom keeps sperm from going inside a woman's body. They should be thrown away after one use. Diaphragm A diaphragm is a soft, dome-shaped barrier. It is inserted into the vagina before sex, along with a spermicide. The diaphragm blocks sperm from entering the uterus, and the spermicide kills sperm. A diaphragm should be left in the vagina for 6-8 hours after sex and removed within 24 hours. A diaphragm is prescribed and fitted by a health care provider. A diaphragm should be replaced every 1-2 years, after giving birth, after gaining more than 15 lb (6.8 kg), and after pelvic surgery. Cervical cap A cervical cap is a round, soft latex or plastic cup that fits over the cervix. It is inserted into the vagina before sex, along with spermicide. It blocks sperm from entering the uterus. The cap should be left in place for 6-8 hours after sex and removed within 48 hours. A cervical cap must be prescribed and fitted by a health care provider. It should be replaced every 2 years. Sponge A sponge is a soft, circular piece of polyurethane foam with spermicide in it. The sponge helps block sperm from entering the uterus, and the spermicide kills sperm. To use it, you make it wet and then insert it into the vagina. It should be inserted before sex, left in for at least 6 hours after sex, and removed and thrown away within 30 hours. Spermicides Spermicides are chemicals that kill or block sperm from entering the   cervix and uterus. They can come as a cream, jelly, suppository, foam, or tablet. A spermicide should be inserted into the vagina with an applicator at least 10-15 minutes before sex to allow time for it to work. The process must be repeated every time you have sex. Spermicides do not require a prescription.   Intrauterine  contraception Intrauterine device (IUD) An IUD is a T-shaped device that is put in a woman's uterus. There are two types:  Hormone IUD.This type contains progestin, a synthetic form of the hormone progesterone. This type can stay in place for 3-5 years.  Copper IUD.This type is wrapped in copper wire. It can stay in place for 10 years. Permanent methods of contraception Female tubal ligation In this method, a woman's fallopian tubes are sealed, tied, or blocked during surgery to prevent eggs from traveling to the uterus. Hysteroscopic sterilization In this method, a small, flexible insert is placed into each fallopian tube. The inserts cause scar tissue to form in the fallopian tubes and block them, so sperm cannot reach an egg. The procedure takes about 3 months to be effective. Another form of birth control must be used during those 3 months. Female sterilization This is a procedure to tie off the tubes that carry sperm (vasectomy). After the procedure, the man can still ejaculate fluid (semen). Another form of birth control must be used for 3 months after the procedure. Natural planning methods Natural family planning In this method, a couple does not have sex on days when the woman could become pregnant. Calendar method In this method, the woman keeps track of the length of each menstrual cycle, identifies the days when pregnancy can happen, and does not have sex on those days. Ovulation method In this method, a couple avoids sex during ovulation. Symptothermal method This method involves not having sex during ovulation. The woman typically checks for ovulation by watching changes in her temperature and in the consistency of cervical mucus. Post-ovulation method In this method, a couple waits to have sex until after ovulation. Where to find more information  Centers for Disease Control and Prevention: www.cdc.gov Summary  Contraception, also called birth control, refers to methods or  devices that prevent pregnancy.  Hormonal methods of contraception include implants, injections, pills, patches, vaginal rings, and emergency contraceptives.  Barrier methods of contraception can include female condoms, female condoms, diaphragms, cervical caps, sponges, and spermicides.  There are two types of IUDs (intrauterine devices). An IUD can be put in a woman's uterus to prevent pregnancy for 3-5 years.  Permanent sterilization can be done through a procedure for males and females. Natural family planning methods involve nothaving sex on days when the woman could become pregnant. This information is not intended to replace advice given to you by your health care provider. Make sure you discuss any questions you have with your health care provider. Document Revised: 06/20/2019 Document Reviewed: 06/20/2019 Elsevier Patient Education  2021 Elsevier Inc.   Breastfeeding  Choosing to breastfeed is one of the best decisions you can make for yourself and your baby. A change in hormones during pregnancy causes your breasts to make breast milk in your milk-producing glands. Hormones prevent breast milk from being released before your baby is born. They also prompt milk flow after birth. Once breastfeeding has begun, thoughts of your baby, as well as his or her sucking or crying, can stimulate the release of milk from your milk-producing glands. Benefits of breastfeeding Research shows that breastfeeding offers many health benefits   for infants and mothers. It also offers a cost-free and convenient way to feed your baby. For your baby  Your first milk (colostrum) helps your baby's digestive system to function better.  Special cells in your milk (antibodies) help your baby to fight off infections.  Breastfed babies are less likely to develop asthma, allergies, obesity, or type 2 diabetes. They are also at lower risk for sudden infant death syndrome (SIDS).  Nutrients in breast milk are better  able to meet your baby's needs compared to infant formula.  Breast milk improves your baby's brain development. For you  Breastfeeding helps to create a very special bond between you and your baby.  Breastfeeding is convenient. Breast milk costs nothing and is always available at the correct temperature.  Breastfeeding helps to burn calories. It helps you to lose the weight that you gained during pregnancy.  Breastfeeding makes your uterus return faster to its size before pregnancy. It also slows bleeding (lochia) after you give birth.  Breastfeeding helps to lower your risk of developing type 2 diabetes, osteoporosis, rheumatoid arthritis, cardiovascular disease, and breast, ovarian, uterine, and endometrial cancer later in life. Breastfeeding basics Starting breastfeeding  Find a comfortable place to sit or lie down, with your neck and back well-supported.  Place a pillow or a rolled-up blanket under your baby to bring him or her to the level of your breast (if you are seated). Nursing pillows are specially designed to help support your arms and your baby while you breastfeed.  Make sure that your baby's tummy (abdomen) is facing your abdomen.  Gently massage your breast. With your fingertips, massage from the outer edges of your breast inward toward the nipple. This encourages milk flow. If your milk flows slowly, you may need to continue this action during the feeding.  Support your breast with 4 fingers underneath and your thumb above your nipple (make the letter "C" with your hand). Make sure your fingers are well away from your nipple and your baby's mouth.  Stroke your baby's lips gently with your finger or nipple.  When your baby's mouth is open wide enough, quickly bring your baby to your breast, placing your entire nipple and as much of the areola as possible into your baby's mouth. The areola is the colored area around your nipple. ? More areola should be visible above your  baby's upper lip than below the lower lip. ? Your baby's lips should be opened and extended outward (flanged) to ensure an adequate, comfortable latch. ? Your baby's tongue should be between his or her lower gum and your breast.  Make sure that your baby's mouth is correctly positioned around your nipple (latched). Your baby's lips should create a seal on your breast and be turned out (everted).  It is common for your baby to suck about 2-3 minutes in order to start the flow of breast milk. Latching Teaching your baby how to latch onto your breast properly is very important. An improper latch can cause nipple pain, decreased milk supply, and poor weight gain in your baby. Also, if your baby is not latched onto your nipple properly, he or she may swallow some air during feeding. This can make your baby fussy. Burping your baby when you switch breasts during the feeding can help to get rid of the air. However, teaching your baby to latch on properly is still the best way to prevent fussiness from swallowing air while breastfeeding. Signs that your baby has successfully latched onto   your nipple  Silent tugging or silent sucking, without causing you pain. Infant's lips should be extended outward (flanged).  Swallowing heard between every 3-4 sucks once your milk has started to flow (after your let-down milk reflex occurs).  Muscle movement above and in front of his or her ears while sucking. Signs that your baby has not successfully latched onto your nipple  Sucking sounds or smacking sounds from your baby while breastfeeding.  Nipple pain. If you think your baby has not latched on correctly, slip your finger into the corner of your baby's mouth to break the suction and place it between your baby's gums. Attempt to start breastfeeding again. Signs of successful breastfeeding Signs from your baby  Your baby will gradually decrease the number of sucks or will completely stop sucking.  Your baby  will fall asleep.  Your baby's body will relax.  Your baby will retain a small amount of milk in his or her mouth.  Your baby will let go of your breast by himself or herself. Signs from you  Breasts that have increased in firmness, weight, and size 1-3 hours after feeding.  Breasts that are softer immediately after breastfeeding.  Increased milk volume, as well as a change in milk consistency and color by the fifth day of breastfeeding.  Nipples that are not sore, cracked, or bleeding. Signs that your baby is getting enough milk  Wetting at least 1-2 diapers during the first 24 hours after birth.  Wetting at least 5-6 diapers every 24 hours for the first week after birth. The urine should be clear or pale yellow by the age of 5 days.  Wetting 6-8 diapers every 24 hours as your baby continues to grow and develop.  At least 3 stools in a 24-hour period by the age of 5 days. The stool should be soft and yellow.  At least 3 stools in a 24-hour period by the age of 7 days. The stool should be seedy and yellow.  No loss of weight greater than 10% of birth weight during the first 3 days of life.  Average weight gain of 4-7 oz (113-198 g) per week after the age of 4 days.  Consistent daily weight gain by the age of 5 days, without weight loss after the age of 2 weeks. After a feeding, your baby may spit up a small amount of milk. This is normal. Breastfeeding frequency and duration Frequent feeding will help you make more milk and can prevent sore nipples and extremely full breasts (breast engorgement). Breastfeed when you feel the need to reduce the fullness of your breasts or when your baby shows signs of hunger. This is called "breastfeeding on demand." Signs that your baby is hungry include:  Increased alertness, activity, or restlessness.  Movement of the head from side to side.  Opening of the mouth when the corner of the mouth or cheek is stroked (rooting).  Increased  sucking sounds, smacking lips, cooing, sighing, or squeaking.  Hand-to-mouth movements and sucking on fingers or hands.  Fussing or crying. Avoid introducing a pacifier to your baby in the first 4-6 weeks after your baby is born. After this time, you may choose to use a pacifier. Research has shown that pacifier use during the first year of a baby's life decreases the risk of sudden infant death syndrome (SIDS). Allow your baby to feed on each breast as long as he or she wants. When your baby unlatches or falls asleep while feeding from the   first breast, offer the second breast. Because newborns are often sleepy in the first few weeks of life, you may need to awaken your baby to get him or her to feed. Breastfeeding times will vary from baby to baby. However, the following rules can serve as a guide to help you make sure that your baby is properly fed:  Newborns (babies 4 weeks of age or younger) may breastfeed every 1-3 hours.  Newborns should not go without breastfeeding for longer than 3 hours during the day or 5 hours during the night.  You should breastfeed your baby a minimum of 8 times in a 24-hour period. Breast milk pumping Pumping and storing breast milk allows you to make sure that your baby is exclusively fed your breast milk, even at times when you are unable to breastfeed. This is especially important if you go back to work while you are still breastfeeding, or if you are not able to be present during feedings. Your lactation consultant can help you find a method of pumping that works best for you and give you guidelines about how long it is safe to store breast milk.      Caring for your breasts while you breastfeed Nipples can become dry, cracked, and sore while breastfeeding. The following recommendations can help keep your breasts moisturized and healthy:  Avoid using soap on your nipples.  Wear a supportive bra designed especially for nursing. Avoid wearing underwire-style  bras or extremely tight bras (sports bras).  Air-dry your nipples for 3-4 minutes after each feeding.  Use only cotton bra pads to absorb leaked breast milk. Leaking of breast milk between feedings is normal.  Use lanolin on your nipples after breastfeeding. Lanolin helps to maintain your skin's normal moisture barrier. Pure lanolin is not harmful (not toxic) to your baby. You may also hand express a few drops of breast milk and gently massage that milk into your nipples and allow the milk to air-dry. In the first few weeks after giving birth, some women experience breast engorgement. Engorgement can make your breasts feel heavy, warm, and tender to the touch. Engorgement peaks within 3-5 days after you give birth. The following recommendations can help to ease engorgement:  Completely empty your breasts while breastfeeding or pumping. You may want to start by applying warm, moist heat (in the shower or with warm, water-soaked hand towels) just before feeding or pumping. This increases circulation and helps the milk flow. If your baby does not completely empty your breasts while breastfeeding, pump any extra milk after he or she is finished.  Apply ice packs to your breasts immediately after breastfeeding or pumping, unless this is too uncomfortable for you. To do this: ? Put ice in a plastic bag. ? Place a towel between your skin and the bag. ? Leave the ice on for 20 minutes, 2-3 times a day.  Make sure that your baby is latched on and positioned properly while breastfeeding. If engorgement persists after 48 hours of following these recommendations, contact your health care provider or a lactation consultant. Overall health care recommendations while breastfeeding  Eat 3 healthy meals and 3 snacks every day. Well-nourished mothers who are breastfeeding need an additional 450-500 calories a day. You can meet this requirement by increasing the amount of a balanced diet that you eat.  Drink  enough water to keep your urine pale yellow or clear.  Rest often, relax, and continue to take your prenatal vitamins to prevent fatigue, stress, and low   vitamin and mineral levels in your body (nutrient deficiencies).  Do not use any products that contain nicotine or tobacco, such as cigarettes and e-cigarettes. Your baby may be harmed by chemicals from cigarettes that pass into breast milk and exposure to secondhand smoke. If you need help quitting, ask your health care provider.  Avoid alcohol.  Do not use illegal drugs or marijuana.  Talk with your health care provider before taking any medicines. These include over-the-counter and prescription medicines as well as vitamins and herbal supplements. Some medicines that may be harmful to your baby can pass through breast milk.  It is possible to become pregnant while breastfeeding. If birth control is desired, ask your health care provider about options that will be safe while breastfeeding your baby. Where to find more information: [employer]: www.llli.org Contact a health care provider if:  You feel like you want to stop breastfeeding or have become frustrated with breastfeeding.  Your nipples are cracked or bleeding.  Your breasts are red, tender, or warm.  You have: ? Painful breasts or nipples. ? A swollen area on either breast. ? A fever or chills. ? Nausea or vomiting. ? Drainage other than breast milk from your nipples.  Your breasts do not become full before feedings by the fifth day after you give birth.  You feel sad and depressed.  Your baby is: ? Too sleepy to eat well. ? Having trouble sleeping. ? More than 1 week old and wetting fewer than 6 diapers in a 24-hour period. ? Not gaining weight by 5 days of age.  Your baby has fewer than 3 stools in a 24-hour period.  Your baby's skin or the white parts of his or her eyes become yellow. Get help right away if:  Your baby is overly tired  (lethargic) and does not want to wake up and feed.  Your baby develops an unexplained fever. Summary  Breastfeeding offers many health benefits for infant and mothers.  Try to breastfeed your infant when he or she shows early signs of hunger.  Gently tickle or stroke your baby's lips with your finger or nipple to allow the baby to open his or her mouth. Bring the baby to your breast. Make sure that much of the areola is in your baby's mouth. Offer one side and burp the baby before you offer the other side.  Talk with your health care provider or lactation consultant if you have questions or you face problems as you breastfeed. This information is not intended to replace advice given to you by your health care provider. Make sure you discuss any questions you have with your health care provider. Document Revised: 04/09/2017 Document Reviewed: 02/15/2016 Elsevier Patient Education  2021 Elsevier Inc.  

## 2020-03-19 ENCOUNTER — Telehealth: Payer: Self-pay | Admitting: Lactation Services

## 2020-03-19 NOTE — Telephone Encounter (Signed)
Called patient in regards to [employer].Patient is a [employer] for Alpha-Thalassem[employer].    Received message that call cannot be completed at this time on [phone] #. Then called home number and female answered and reports patient is not home. Asked her to have patient call the office for results. She reports she will give Scout the message.   My Chart message sent with information.

## 2020-03-20 ENCOUNTER — Other Ambulatory Visit: Payer: Self-pay | Admitting: *Deleted

## 2020-03-20 NOTE — Telephone Encounter (Signed)
Pt was a no show for OUD appt this am and has been rescheduled for next Tues (overbook).  Requests refill.Kingsley Spittle Cassady2/22/20224:56 PM

## 2020-03-21 ENCOUNTER — Other Ambulatory Visit: Payer: Self-pay | Admitting: Internal Medicine

## 2020-03-21 ENCOUNTER — Telehealth: Payer: Self-pay

## 2020-03-21 MED ORDER — BUPRENORPHINE HCL 8 MG SL SUBL: SUBLINGUAL_TABLET | SUBLINGUAL | 0 refills | Status: DC

## 2020-03-21 MED FILL — BUPRENORPHINE HCL 8 MG SUBL: 8 | 8 days supply | Qty: 18 | Fill #0

## 2020-03-21 NOTE — Telephone Encounter (Signed)
Received TC from patient who is asking if her Subutex was sent to the pharmacy.  Pt informed, per chart review, RX was sent this morning to [facility] w/ receipt confirmation.  Pt also asking if she has an appt in OUD on 03/27/20 @0845 .  No appt noted, pt states she missed her appt yesterday and was told by someone at [facility] she will be worked into the schedule next Tuesday at 0845.  Informed patient RN does not see a pending appt, but will forward to OUD nurse to follow up.  Pt states she will call [facility] back regarding this tomorrow morning.  SChaplin, RN,BSN

## 2020-03-27 ENCOUNTER — Ambulatory Visit (INDEPENDENT_AMBULATORY_CARE_PROVIDER_SITE_OTHER): Payer: Self-pay | Admitting: Internal Medicine

## 2020-03-27 ENCOUNTER — Other Ambulatory Visit: Payer: Self-pay | Admitting: Internal Medicine

## 2020-03-27 ENCOUNTER — Other Ambulatory Visit: Payer: Self-pay

## 2020-03-27 VITALS — BP 96/52 | HR 82 | Temp 98.2°F | Ht 62.0 in | Wt 157.4 lb

## 2020-03-27 DIAGNOSIS — B359 Dermatophytosis, unspecified: Secondary | ICD-10-CM

## 2020-03-27 DIAGNOSIS — O210 Mild hyperemesis gravidarum: Secondary | ICD-10-CM | POA: Insufficient documentation

## 2020-03-27 DIAGNOSIS — F1120 Opioid dependence, uncomplicated: Secondary | ICD-10-CM

## 2020-03-27 MED ORDER — BUPRENORPHINE HCL 8 MG SL SUBL: 8.0000 mg | SUBLINGUAL_TABLET | Freq: Three times a day (TID) | SUBLINGUAL | 0 refills | Status: DC

## 2020-03-27 MED FILL — BUPRENORPHINE HCL 8 MG SUBL: 8 | 14 days supply | Qty: 42 | Fill #0

## 2020-03-27 NOTE — Assessment & Plan Note (Signed)
Pt notes 2w history of rash. Lesions noted on her right calf, left finger and left arm. Lesions consistent with ringworm -recommend OTC lotrimin treatment

## 2020-03-27 NOTE — Assessment & Plan Note (Signed)
This encounter was for follow up of chronic opioid use. Currently being maintained on subutex 8-2mg  twice daily and a half tablet in the afternoon. She currently admits to still experiencing cravings between her afternoon and evening dose. Denies recent illicit drug use. Plan -will increase subutex to 8-2mg  every 8 hours but advised her to try to stick with the half tablet for the afternoon dose if able.  -UDS collected today -f/u in 4 weeks

## 2020-03-27 NOTE — Progress Notes (Signed)
Office Visit   Patient ID: [name], female    DOB: January 10, 1996, 25 y.o.   MRN: [id number]  Subjective:  CC: chronic opioid use disorder   HPI 25 y.o. presents today for follow up of chronic opioid use disorder. Please refer to problem based charting for details, assesment and plan.      ACTIVE MEDICATIONS   Outpatient Medications Prior to Visit  Medication Sig Dispense Refill  . Blood Pressure Monitoring (BLOOD PRESSURE KIT) DEVI 1 Device by Does not apply route as needed. (Patient not taking: Reported on 03/13/2020) 1 each 0  . docusate sodium (COLACE) 100 MG capsule Take 1 capsule (100 mg total) by mouth 2 (two) times daily. 60 capsule 0  . gabapentin (NEURONTIN) 100 MG capsule Take 1 capsule (100 mg total) by mouth 3 (three) times daily. 90 capsule 1  . polyethylene glycol (MIRALAX) 17 g packet Take 17 g by mouth daily. 14 each 0  . Prenat-Fe Poly-Methfol-FA-DHA (VITAFOL ULTRA) 29-0.6-0.4-200 MG CAPS Take 1 tablet by mouth daily. 30 capsule 12  . prenatal vitamin w/FE, FA (PRENATAL 1 + 1) 27-1 MG TABS tablet Take 1 tablet by mouth daily at 12 noon.    . promethazine (PHENERGAN) 25 MG tablet TAKE 1 TABLET BY MOUTH TWICE DAILY AS NEEDED FOR NAUSEA. IF NOT TOLERATED BY MOUTH, YOU CAN PLACE THEM IN THE VAGINA OR RECTUM INSTEAD. 28 tablet 1  . UNKNOWN TO PATIENT Patient reports taking 3 laxatives around 1130pm 03/09/20    . buprenorphine (SUBUTEX) 8 MG SUBL SL tablet Use two and one-half tablets per day 18 tablet 0   No facility-administered medications prior to visit.     ROS  Review of Systems  Gastrointestinal: Positive for nausea and vomiting.  Skin: Positive for rash.  Neurological: Positive for dizziness.  Psychiatric/Behavioral: Negative.     Objective:   BP (!) 96/52 (BP Location: Left Arm, Patient Position: Sitting, Cuff Size: Normal)   Pulse 82   Temp 98.2 F (36.8 C) (Oral)   Ht '5\' 2"'  (1.575 m)   Wt 157 lb 6.4 oz (71.4 kg)   LMP 11/26/2019   SpO2 100%    BMI 28.79 kg/m  Wt Readings from Last 3 Encounters:  03/27/20 157 lb 6.4 oz (71.4 kg)  02/21/20 155 lb 12.8 oz (70.7 kg)  02/14/20 156 lb 6.4 oz (70.9 kg)   BP Readings from Last 3 Encounters:  03/27/20 (!) 96/52  03/10/20 (!) 105/43  02/21/20 110/67   Physical Exam HENT:     Mouth/Throat:     Mouth: Mucous membranes are dry.  Cardiovascular:     Rate and Rhythm: Normal rate and regular rhythm.  Skin:    Comments: circumferential rash with well demarcated border on the right calf, left 4th finger and left upper arm     Health Maintenance:   Health Maintenance  Topic Date Due  . COVID-19 Vaccine (1) Never done  . HPV VACCINES (1 - 2-dose series) Never done  . INFLUENZA VACCINE  04/26/2020 (Originally 08/28/2019)  . PAP-Cervical Cytology Screening  11/15/2021  . PAP SMEAR-Modifier  11/15/2021  . TETANUS/TDAP  08/09/2023  . Hepatitis C Screening  Completed  . HIV Screening  Completed     Assessment & Plan:   Problem List Items Addressed This Visit      Musculoskeletal and Integument   Ringworm    Pt notes 2w history of rash. Lesions noted on her right calf, left finger and left arm. Lesions consistent  with ringworm -recommend OTC lotrimin treatment        Other   Severe opioid use disorder ([place]) - Primary (Chronic)    This encounter was for follow up of chronic opioid use. Currently being maintained on subutex 8-6m twice daily and a half tablet in the afternoon. She currently admits to still experiencing cravings between her afternoon and evening dose. Denies recent illicit drug use. Plan -will increase subutex to 8-235mevery 8 hours but advised her to try to stick with the half tablet for the afternoon dose if able.  -UDS collected today -f/u in 4 weeks      Relevant Orders   ToxAssure Select,+Antidepr,UR        Pt discussed with Dr. HoForde RadonMD Internal Medicine Resident PGY-2 [facility] Pager:  #[phone]/01/2020 11:09 AM

## 2020-03-30 ENCOUNTER — Encounter: Payer: Self-pay | Admitting: *Deleted

## 2020-03-30 NOTE — Progress Notes (Signed)
Internal Medicine Clinic Attending  Case discussed with Dr. Christian  At the time of the visit.  We reviewed the resident's history and exam and pertinent patient test results.  I agree with the assessment, diagnosis, and plan of care documented in the resident's note.  

## 2020-04-02 ENCOUNTER — Other Ambulatory Visit: Payer: Self-pay | Admitting: Obstetrics and Gynecology

## 2020-04-02 DIAGNOSIS — Z3400 Encounter for supervision of normal first pregnancy, unspecified trimester: Secondary | ICD-10-CM

## 2020-04-05 LAB — TOXASSURE SELECT,+ANTIDEPR,UR

## 2020-04-10 ENCOUNTER — Ambulatory Visit: Payer: Medicaid Other | Attending: Obstetrics and Gynecology

## 2020-04-10 ENCOUNTER — Ambulatory Visit (INDEPENDENT_AMBULATORY_CARE_PROVIDER_SITE_OTHER): Payer: Medicaid Other | Admitting: Internal Medicine

## 2020-04-10 ENCOUNTER — Other Ambulatory Visit: Payer: Self-pay | Admitting: *Deleted

## 2020-04-10 ENCOUNTER — Other Ambulatory Visit: Payer: Self-pay

## 2020-04-10 ENCOUNTER — Encounter: Payer: Self-pay | Admitting: Internal Medicine

## 2020-04-10 ENCOUNTER — Ambulatory Visit: Payer: Medicaid Other | Admitting: *Deleted

## 2020-04-10 ENCOUNTER — Encounter: Payer: Self-pay | Admitting: *Deleted

## 2020-04-10 DIAGNOSIS — O09899 Supervision of other high risk pregnancies, unspecified trimester: Secondary | ICD-10-CM

## 2020-04-10 DIAGNOSIS — F1120 Opioid dependence, uncomplicated: Secondary | ICD-10-CM | POA: Diagnosis not present

## 2020-04-10 DIAGNOSIS — F17210 Nicotine dependence, cigarettes, uncomplicated: Secondary | ICD-10-CM | POA: Diagnosis not present

## 2020-04-10 DIAGNOSIS — O99322 Drug use complicating pregnancy, second trimester: Secondary | ICD-10-CM | POA: Diagnosis not present

## 2020-04-10 DIAGNOSIS — O99332 Smoking (tobacco) complicating pregnancy, second trimester: Secondary | ICD-10-CM

## 2020-04-10 DIAGNOSIS — O99320 Drug use complicating pregnancy, unspecified trimester: Secondary | ICD-10-CM | POA: Insufficient documentation

## 2020-04-10 DIAGNOSIS — F411 Generalized anxiety disorder: Secondary | ICD-10-CM | POA: Diagnosis not present

## 2020-04-10 DIAGNOSIS — Z3400 Encounter for supervision of normal first pregnancy, unspecified trimester: Secondary | ICD-10-CM

## 2020-04-10 DIAGNOSIS — Z3A19 19 weeks gestation of pregnancy: Secondary | ICD-10-CM

## 2020-04-10 DIAGNOSIS — O289 Unspecified abnormal findings on antenatal screening of mother: Secondary | ICD-10-CM

## 2020-04-10 DIAGNOSIS — F1121 Opioid dependence, in remission: Secondary | ICD-10-CM | POA: Diagnosis not present

## 2020-04-10 DIAGNOSIS — Z79891 Long term (current) use of opiate analgesic: Secondary | ICD-10-CM

## 2020-04-10 DIAGNOSIS — Z148 Genetic carrier of other disease: Secondary | ICD-10-CM

## 2020-04-10 MED ORDER — GABAPENTIN 300 MG PO CAPS
300.0000 mg | ORAL_CAPSULE | Freq: Three times a day (TID) | ORAL | 1 refills | Status: DC
Start: 2020-04-10 — End: 2020-04-10

## 2020-04-10 MED ORDER — BUPRENORPHINE HCL 8 MG SL SUBL
8.0000 mg | SUBLINGUAL_TABLET | Freq: Three times a day (TID) | SUBLINGUAL | 0 refills | Status: DC
Start: 2020-04-10 — End: 2020-04-25

## 2020-04-10 NOTE — Progress Notes (Signed)
    This is a telephone encounter between [name] and Reymundo Poll on 04/10/2020 for OUD follow up. The visit was conducted with the patient located at home and Reymundo Poll at [facility]. The patient's identity was confirmed using their DOB and current address. The patient has consented to being evaluated through a telephone encounter and understands the associated risks (an examination cannot be done and the patient may need to come in for an appointment) / benefits (allows the patient to remain at home, decreasing exposure to coronavirus). I personally spent 15 minutes on medical discussion.    [name] presents for follow up of opioid use disorder I have reviewed the prior induction visit, follow up visits, and telephone encounters relevant to opiate use disorder (OUD) treatment.   Current daily dose: Suboxone 8 mg TID  Date of Induction: 01/10/2020  Current follow up interval, in weeks: 2 weeks  The patient has been adherent with the buprenorphine for OUD contract.   Last UDS Result: 03/27/20 appropriate for buprenorphine and metabolite   HPI: For the details of today's visit, please refer to the assessment and plan.   Review of Systems  Musculoskeletal: Positive for myalgias.  Psychiatric/Behavioral: The patient is nervous/anxious.      Assessment & Plan:   See Encounters Tab for problem based charting.

## 2020-04-10 NOTE — Assessment & Plan Note (Signed)
Patient initially scheduled for in person follow up but requested to change to telehealth. She is doing well with the increased dose of subutex 8 mg TID. She feels this has helped with her cravings. Denies relapse. She is currently [redacted] weeks pregnant and [facility] closely with [facility] for her [facility] care. Plan for in person follow up in 2 weeks. If Utox is appropriate at that visit we can likely space her out to 1 month follow up.  -- Refilled subutex 8 mg TID -- Follow up in person in 2 weeks

## 2020-04-10 NOTE — Assessment & Plan Note (Signed)
Patient is requesting refill of her gabapentin. This was refilled during a visit in January but she reports the prescription was for the wrong dose. On review of prior psych notes, she was initially prescribed 300 mg QHS but she says this was increased at her outpatient follow up visit to [facility] (I am unable to see these records). She has follow up scheduled with her psychiatrist next month. I have sent a short term refill of her gabapentin 300 mg TID and encouraged her to keep her follow up appointment.

## 2020-04-20 MED FILL — GABAPENTIN 300 MG CAPSULE: 300 | 30 days supply | Qty: 90 | Fill #0

## 2020-04-24 ENCOUNTER — Telehealth: Payer: Self-pay | Admitting: *Deleted

## 2020-04-24 NOTE — Telephone Encounter (Signed)
Pt dnka appt today with OUD  Call placed to pt- No answer, message left on recorder.Melinda Spittle Cassady3/29/202211:13 AM

## 2020-04-25 ENCOUNTER — Other Ambulatory Visit: Payer: Self-pay

## 2020-04-25 ENCOUNTER — Other Ambulatory Visit: Payer: Self-pay | Admitting: Internal Medicine

## 2020-04-25 DIAGNOSIS — F1120 Opioid dependence, uncomplicated: Secondary | ICD-10-CM

## 2020-04-25 MED ORDER — BUPRENORPHINE HCL 8 MG SL SUBL
8.0000 mg | SUBLINGUAL_TABLET | Freq: Three times a day (TID) | SUBLINGUAL | 0 refills | Status: DC
Start: 2020-04-25 — End: 2020-04-25

## 2020-04-25 MED FILL — BUPRENORPHINE HCL 8 MG SUBL: 8 | 14 days supply | Qty: 42 | Fill #0

## 2020-04-25 NOTE — Telephone Encounter (Signed)
Sent 2 week refill to the [facility] outpatient pharmacy to last until her next appointment. Thank you!

## 2020-04-25 NOTE — Telephone Encounter (Signed)
Attempted to relay info below to patient, however, recording immediately picked up stating mail box has not been set up.

## 2020-04-25 NOTE — Telephone Encounter (Signed)
  buprenorphine (SUBUTEX) 8 MG SUBL SL tablet, refill request @  [facility] - [place], [place] - [address] [facility]. Phone:  [phone]  Fax:  [phone]

## 2020-04-25 NOTE — Telephone Encounter (Signed)
Patient no showed OUD appt yesterday. Returned her call. States she thought appt was for next Tuesday. States she has been having transportation issues. R/s for first available OUD appt on 05/08/2020 at 0915. Asked her to start working on transportation arrangements now. She is requesting refill on suboxone till that appt.

## 2020-04-26 ENCOUNTER — Telehealth: Payer: Self-pay | Admitting: Internal Medicine

## 2020-04-26 NOTE — Telephone Encounter (Signed)
Pt rtn call about her medication Refills.  buprenorphine (SUBUTEX) 8 MG SUBL SL tablet   [facility] - [place], [place] - [address] [facility]. (Ph: [phone])

## 2020-04-26 NOTE — Telephone Encounter (Signed)
Call / inform pt Subutex was refilled yesterday.

## 2020-05-07 ENCOUNTER — Telehealth: Payer: Self-pay | Admitting: *Deleted

## 2020-05-07 NOTE — Telephone Encounter (Signed)
Patient's mom called stating patient will definitely need med refill on tomorrow.  OUD clinic was cancelled.

## 2020-05-08 ENCOUNTER — Other Ambulatory Visit (HOSPITAL_COMMUNITY): Payer: Self-pay

## 2020-05-08 ENCOUNTER — Ambulatory Visit: Payer: Medicaid Other | Attending: Obstetrics and Gynecology

## 2020-05-08 ENCOUNTER — Other Ambulatory Visit: Payer: Self-pay

## 2020-05-08 ENCOUNTER — Encounter: Payer: Self-pay | Admitting: *Deleted

## 2020-05-08 ENCOUNTER — Other Ambulatory Visit: Payer: Self-pay | Admitting: Internal Medicine

## 2020-05-08 ENCOUNTER — Ambulatory Visit: Payer: Medicaid Other | Admitting: *Deleted

## 2020-05-08 DIAGNOSIS — Z3400 Encounter for supervision of normal first pregnancy, unspecified trimester: Secondary | ICD-10-CM | POA: Diagnosis present

## 2020-05-08 DIAGNOSIS — O09899 Supervision of other high risk pregnancies, unspecified trimester: Secondary | ICD-10-CM | POA: Diagnosis present

## 2020-05-08 DIAGNOSIS — O99320 Drug use complicating pregnancy, unspecified trimester: Secondary | ICD-10-CM | POA: Insufficient documentation

## 2020-05-08 DIAGNOSIS — F1120 Opioid dependence, uncomplicated: Secondary | ICD-10-CM

## 2020-05-08 MED FILL — Azithromycin Tab 500 MG: ORAL | 1 days supply | Qty: 2 | Fill #0 | Status: CN

## 2020-05-08 NOTE — Telephone Encounter (Signed)
There should be 1 more refill on Gabapentin - called pt ; no answer. VM has not been activated ,unable to leave a message.

## 2020-05-08 NOTE — Telephone Encounter (Signed)
Refill Request- PT has sch fro 05/29/2020 with the OUD Clinic.    buprenorphine (SUBUTEX) 8 MG SUBL SL tablet  gabapentin (NEURONTIN) 300 MG capsule    [facility] (Ph: [phone])

## 2020-05-09 ENCOUNTER — Other Ambulatory Visit: Payer: Self-pay

## 2020-05-09 ENCOUNTER — Other Ambulatory Visit (HOSPITAL_COMMUNITY): Payer: Self-pay

## 2020-05-09 ENCOUNTER — Other Ambulatory Visit: Payer: Self-pay | Admitting: *Deleted

## 2020-05-09 DIAGNOSIS — F1120 Opioid dependence, uncomplicated: Secondary | ICD-10-CM

## 2020-05-09 DIAGNOSIS — Q270 Congenital absence and hypoplasia of umbilical artery: Secondary | ICD-10-CM

## 2020-05-09 MED ORDER — BUPRENORPHINE HCL 8 MG SL SUBL
8.0000 mg | SUBLINGUAL_TABLET | Freq: Three times a day (TID) | SUBLINGUAL | 0 refills | Status: DC
  Filled 2020-05-09: qty 42, 14d supply, fill #0

## 2020-05-09 NOTE — Telephone Encounter (Signed)
Next appt scheduled  5/3 in OUD clinic.

## 2020-05-09 NOTE — Telephone Encounter (Signed)
Pt states she is completely out of  buprenorphine (SUBUTEX) 8 MG SUBL SL tablet. Needs med to be filled by today.

## 2020-05-09 NOTE — Telephone Encounter (Signed)
Pt mom is requesting her buprenorphine (SUBUTEX) 8 MG SUBL SL tablet sent to  [facility] Phone:  [phone]  Fax:  [phone]      ( pt appt was canceled for this past Tuesday  05/08/20  And she was given an appt in may  But by pt  choice  .. she was to get her medicine yesterday so she is in need of a refill )

## 2020-05-10 ENCOUNTER — Telehealth: Payer: Self-pay | Admitting: *Deleted

## 2020-05-10 ENCOUNTER — Other Ambulatory Visit (HOSPITAL_COMMUNITY): Payer: Self-pay

## 2020-05-10 NOTE — Telephone Encounter (Signed)
Call to Pharmacy patient does not need a PA for her Suboxone .

## 2020-05-10 NOTE — Telephone Encounter (Signed)
Call to [facility] about need for PA for Suboxone tablets.  Spoke  To Pharmacist -no PA is needed as patient is on IM program.  Patient pays $25 for and prescription is ready for pick up.  Attempts to call patient.  Unable to get through to leave message.  Angelina Ok, RN 05/10/2020 3:09 PM.

## 2020-05-10 NOTE — Telephone Encounter (Signed)
Patient spoke with FO and stated suboxone needs a PA. Will forward to PA Staff.

## 2020-05-14 ENCOUNTER — Other Ambulatory Visit: Payer: Self-pay

## 2020-05-14 ENCOUNTER — Other Ambulatory Visit (HOSPITAL_COMMUNITY): Payer: Self-pay

## 2020-05-14 ENCOUNTER — Inpatient Hospital Stay (HOSPITAL_COMMUNITY)
Admission: AD | Admit: 2020-05-14 | Discharge: 2020-05-14 | Disposition: A | Discharge status: Home / Self Care | Payer: Medicaid Other | Attending: Obstetrics & Gynecology | Admitting: Obstetrics & Gynecology

## 2020-05-14 ENCOUNTER — Encounter (HOSPITAL_COMMUNITY): Payer: Self-pay | Admitting: Obstetrics & Gynecology

## 2020-05-14 DIAGNOSIS — Z3A24 24 weeks gestation of pregnancy: Secondary | ICD-10-CM | POA: Diagnosis not present

## 2020-05-14 DIAGNOSIS — O98812 Other maternal infectious and parasitic diseases complicating pregnancy, second trimester: Secondary | ICD-10-CM | POA: Insufficient documentation

## 2020-05-14 DIAGNOSIS — Z79899 Other long term (current) drug therapy: Secondary | ICD-10-CM | POA: Diagnosis not present

## 2020-05-14 DIAGNOSIS — O26892 Other specified pregnancy related conditions, second trimester: Secondary | ICD-10-CM | POA: Insufficient documentation

## 2020-05-14 DIAGNOSIS — O23592 Infection of other part of genital tract in pregnancy, second trimester: Secondary | ICD-10-CM

## 2020-05-14 DIAGNOSIS — F17210 Nicotine dependence, cigarettes, uncomplicated: Secondary | ICD-10-CM | POA: Diagnosis not present

## 2020-05-14 DIAGNOSIS — R102 Pelvic and perineal pain: Secondary | ICD-10-CM | POA: Insufficient documentation

## 2020-05-14 DIAGNOSIS — O99332 Smoking (tobacco) complicating pregnancy, second trimester: Secondary | ICD-10-CM | POA: Diagnosis not present

## 2020-05-14 DIAGNOSIS — B373 Candidiasis of vulva and vagina: Secondary | ICD-10-CM | POA: Insufficient documentation

## 2020-05-14 DIAGNOSIS — O26899 Other specified pregnancy related conditions, unspecified trimester: Secondary | ICD-10-CM

## 2020-05-14 DIAGNOSIS — B3731 Acute candidiasis of vulva and vagina: Secondary | ICD-10-CM

## 2020-05-14 LAB — WET PREP, GENITAL
Clue Cells Wet Prep HPF POC: NONE SEEN
Sperm: NONE SEEN
Trich, Wet Prep: NONE SEEN

## 2020-05-14 LAB — GC/CHLAMYDIA PROBE AMP (~~LOC~~) NOT AT ARMC
Chlamydia: NEGATIVE
Comment: NEGATIVE
Comment: NORMAL
Neisseria Gonorrhea: NEGATIVE

## 2020-05-14 LAB — URINALYSIS, ROUTINE W REFLEX MICROSCOPIC
Bilirubin Urine: NEGATIVE
Glucose, UA: NEGATIVE mg/dL
Hgb urine dipstick: NEGATIVE
Ketones, ur: NEGATIVE mg/dL
Leukocytes,Ua: NEGATIVE
Nitrite: NEGATIVE
Protein, ur: NEGATIVE mg/dL
Specific Gravity, Urine: 1.018 (ref 1.005–1.030)
pH: 7 (ref 5.0–8.0)

## 2020-05-14 MED ORDER — CYCLOBENZAPRINE HCL 5 MG PO TABS
10.0000 mg | ORAL_TABLET | Freq: Once | ORAL | Status: AC
  Administered 2020-05-14: 10 mg via ORAL
  Filled 2020-05-14: qty 2

## 2020-05-14 MED ORDER — TERCONAZOLE 0.4 % VA CREA
1.0000 | TOPICAL_CREAM | Freq: Every day | VAGINAL | 0 refills | Status: DC
  Filled 2020-05-14: qty 45, 7d supply, fill #0

## 2020-05-14 MED ORDER — LACTATED RINGERS IV BOLUS
1000.0000 mL | Freq: Once | INTRAVENOUS | Status: AC
  Administered 2020-05-14: 1000 mL via INTRAVENOUS

## 2020-05-14 MED ORDER — CYCLOBENZAPRINE HCL 10 MG PO TABS
10.0000 mg | ORAL_TABLET | Freq: Two times a day (BID) | ORAL | 0 refills | Status: DC | PRN
  Filled 2020-05-14: qty 20, 10d supply, fill #0

## 2020-05-14 MED ORDER — NIFEDIPINE 10 MG PO CAPS
10.0000 mg | ORAL_CAPSULE | ORAL | Status: DC | PRN
  Administered 2020-05-14: 10 mg via ORAL
  Filled 2020-05-14: qty 1

## 2020-05-14 NOTE — MAU Provider Note (Signed)
History     CSN: [id number]  Arrival date and time: 05/14/20 0240   Event Date/Time   First Provider Initiated Contact with Patient 05/14/20 0308      Chief Complaint  Patient presents with  . Abdominal Cramping  . Vaginal Bleeding    Spotting-dark and bright red   HPI  [name] is a 25 y.o. G2P0010 at 48w2dwho presents with abdominal pain. She states it started yesterday and got worse around 0230 am. She reports the pain comes in waves and is a 10/10. She also reports spotting when she wipes. She denies any leaking or discharge. She denies any recent intercourse. She reports taking her regular subutex dose last night and not missing any doses. She reports normal fetal movement.   OB History    Gravida  2   Para  0   Term  0   Preterm  0   AB  1   Living  0     SAB  0   IAB  1   Ectopic  0   Multiple  0   Live Births  0           Past Medical History:  Diagnosis Date  . ADHD (attention deficit hyperactivity disorder)   . Anesthesia complication    woke up fighting after tonsillectomy  . Anxiety   . Cholecystitis   . Complication of anesthesia    when wakes up " freaks out- like panic attack"  . Depression   . Dysmenorrhea   . GERD (gastroesophageal reflux disease)   . Headache    migraines  . Hyperlipidemia   . Hypoglycemia   . Polysubstance abuse ([facility]   . Pregnancy complicated by subutex maintenance, antepartum ([place]   . Syncope   . Viral warts    hand  . Vision abnormalities    wears glasses    Past Surgical History:  Procedure Laterality Date  . ADENOIDECTOMY    . CHOLECYSTECTOMY  03/21/2011   Procedure: LAPAROSCOPIC CHOLECYSTECTOMY;  Surgeon: DHarl Bowie MD;  [place] [facility]  Service: General;  Laterality: N/A;  . ESOPHAGOGASTRODUODENOSCOPY  09/26/2011   Procedure: ESOPHAGOGASTRODUODENOSCOPY (EGD);  Surgeon: JOletha Blend MD;  [place] [facility]  Service: Gastroenterology;  Laterality: N/A;  . TONSILLECTOMY AND  ADENOIDECTOMY  06/2005  . WISDOM TOOTH EXTRACTION      Family History  Problem Relation Age of Onset  . Anesthesia problems Maternal Grandfather   . Heart disease Maternal Grandfather   . Nephrolithiasis Maternal Grandfather   . Diabetes Maternal Grandfather   . Mental illness Maternal Grandfather   . Cholelithiasis Mother   . Nephrolithiasis Mother   . Depression Mother   . Hypertension Mother   . Miscarriages / [place]orea[place]   . Anxiety disorder Mother   . Gout Father   . Nephrolithiasis Maternal Grandmother   . COPD Maternal Grandmother   . Heart disease Paternal Grandfather   . Cholelithiasis Maternal Aunt   . Depression Maternal Aunt   . Learning disabilities Maternal Aunt   . Bipolar disorder Sister     Social History   Tobacco Use  . Smoking status: Current Every Day Smoker    Packs/day: 0.50    Years: 4.00    Pack years: 2.00    Types: Cigarettes  . Smokeless tobacco: Never Used  . Tobacco comment: 0.5 PPD  Vaping Use  . Vaping Use: Never used  Substance Use Topics  . Alcohol use: Not Currently  Comment: socially  . Drug use: Not Currently    Types: Marijuana, Heroin, Benzodiazepines    Comment: last used 11/2019     Allergies:  Allergies  Allergen Reactions  . Blueberry Fruit Extract Anaphylaxis  . Contrast Media [Iodinated Diagnostic Agents] Anaphylaxis and Rash  . Codeine Hives, Itching and Nausea And Vomiting  . Omnipaque [Iohexol] Itching, Nausea And Vomiting and Swelling    Medications Prior to Admission  Medication Sig Dispense Refill Last Dose  . azithromycin (ZITHROMAX) 500 MG tablet TAKE 2 TABLETS (1,000 MG TOTAL) BY MOUTH ONCE FOR 1 DOSE. 2 tablet 1   . Blood Pressure Monitoring (BLOOD PRESSURE KIT) DEVI 1 Device by Does not apply route as needed. (Patient not taking: Reported on 03/13/2020) 1 each 0   . buprenorphine (SUBUTEX) 8 MG SUBL SL tablet Place 1 tablet (8 mg total) under the tongue in the morning, at noon, and at bedtime.  42 tablet 0   . gabapentin (NEURONTIN) 300 MG capsule TAKE 1 CAPSULE (300 MG TOTAL) BY MOUTH 3 (THREE) TIMES DAILY. 90 capsule 1   . polyethylene glycol (MIRALAX) 17 g packet Take 17 g by mouth daily. 14 each 0   . Prenat-Fe Poly-Methfol-FA-DHA (VITAFOL ULTRA) 29-0.6-0.4-200 MG CAPS Take 1 tablet by mouth daily. 30 capsule 12   . prenatal vitamin w/FE, FA (PRENATAL 1 + 1) 27-1 MG TABS tablet Take 1 tablet by mouth daily at 12 noon.     . promethazine (PHENERGAN) 25 MG tablet TAKE 1 TABLET BY MOUTH TWICE DAILY AS NEEDED FOR NAUSEA. IF NOT TOLERATED BY MOUTH, YOU CAN PLACE THEM IN THE VAGINA OR RECTUM INSTEAD. 28 tablet 1   . UNKNOWN TO PATIENT Patient reports taking 3 laxatives around 1130pm 03/09/20       Review of Systems  Constitutional: Negative.  Negative for fatigue and fever.  HENT: Negative.   Respiratory: Negative.  Negative for shortness of breath.   Cardiovascular: Negative.  Negative for chest pain.  Gastrointestinal: Positive for abdominal pain. Negative for constipation, diarrhea, nausea and vomiting.  Genitourinary: Positive for vaginal bleeding. Negative for dysuria and vaginal discharge.  Neurological: Negative.  Negative for dizziness and headaches.   Physical Exam   Blood pressure (!) 115/59, pulse 73, temperature 97.6 F (36.4 C), temperature source Oral, resp. rate 17, last menstrual period 11/26/2019, SpO2 100 %.  Physical Exam Vitals and nursing note reviewed.  Constitutional:      General: She is not in acute distress.    Appearance: She is well-developed.  HENT:     Head: Normocephalic.  Eyes:     Pupils: Pupils are equal, round, and reactive to light.  Cardiovascular:     Rate and Rhythm: Normal rate and regular rhythm.     Heart sounds: Normal heart sounds.  Pulmonary:     Effort: Pulmonary effort is normal. No respiratory distress.     Breath sounds: Normal breath sounds.  Abdominal:     General: Bowel sounds are normal. There is no distension.      Palpations: Abdomen is soft.     Tenderness: There is no abdominal tenderness.  Genitourinary:    Comments: SSE: Cervix pink, visually closed, without lesion, scant white creamy discharge, vaginal walls and external genitalia normal Bimanual exam: Cervix 0/long/high, firm  Skin:    General: Skin is warm and dry.  Neurological:     Mental Status: She is alert and oriented to person, place, and time.  Psychiatric:  Behavior: Behavior normal.        Thought Content: Thought content normal.        Judgment: Judgment normal.    Fetal Tracing:  Baseline: 130 Variability: moderate Accels: 10x10 Decels: none  Toco: ui   MAU Course  Procedures Results for orders placed or performed during the hospital encounter of 05/14/20 (from the past 24 hour(s))  Urinalysis, Routine w reflex microscopic PATH Cytology Cervicovaginal Ancillary Only     Status: Abnormal   Collection Time: 05/14/20  2:59 AM  Result Value Ref Range   Color, Urine YELLOW YELLOW   APPearance CLOUDY (A) CLEAR   Specific Gravity, Urine 1.018 1.005 - 1.030   pH 7.0 5.0 - 8.0   Glucose, UA NEGATIVE NEGATIVE mg/dL   Hgb urine dipstick NEGATIVE NEGATIVE   Bilirubin Urine NEGATIVE NEGATIVE   Ketones, ur NEGATIVE NEGATIVE mg/dL   Protein, ur NEGATIVE NEGATIVE mg/dL   Nitrite NEGATIVE NEGATIVE   Leukocytes,Ua NEGATIVE NEGATIVE  Wet prep, genital     Status: Abnormal   Collection Time: 05/14/20  3:17 AM   Specimen: PATH Cytology Cervicovaginal Ancillary Only  Result Value Ref Range   Yeast Wet Prep HPF POC PRESENT (A) NONE SEEN   Trich, Wet Prep NONE SEEN NONE SEEN   Clue Cells Wet Prep HPF POC NONE SEEN NONE SEEN   WBC, Wet Prep HPF POC MANY (A) NONE SEEN   Sperm NONE SEEN    MDM UA Wet prep and gc/chlamydia LR bolus Procardia x1- resolution of uterine activity  Patient now describes pain as sharp and shooting with movement. Still rating pain 8/10.  Flexeril PO  Cervix rechecked and unchanged. No signs  of preterm labor at this time.  Assessment and Plan   1. Pain of round ligament during pregnancy   2. [redacted] weeks gestation of pregnancy   3. Candidiasis of vagina    -Discharge home in stable condition -Rx for terazol and flexeril sent to patient's pharmacy. Encouraged patient to get pregnancy support belt. -Preterm labor precautions discussed -Patient advised to follow-up with [facility] as scheduled for prenatal care -Patient may return to MAU as needed or if her condition were to change or worsen   Wende Mott CNM 05/14/2020, 3:08 AM

## 2020-05-14 NOTE — MAU Note (Addendum)
Pt reports uterine cramping that she rates at a 10 on 1-10 scale. States yesterday morning cramping began and within the last 30 min became more intense.Cramping is intermittent approx. Every 10 min. Pt denies SROM or vaginal discharge or foul odor. Pt denies placenta previa or low lying placenta. Denies drug use Pt denies fall or abdominal trauma. Endorses + fetal movement.

## 2020-05-14 NOTE — Discharge Instructions (Signed)

## 2020-05-15 ENCOUNTER — Other Ambulatory Visit (HOSPITAL_COMMUNITY): Payer: Self-pay

## 2020-05-15 NOTE — Telephone Encounter (Signed)
I called pt to let her know PA was done and there's a $25 co-pay per [facility] telephone encounter. She wanted to see if using "pregnacncy " medicaid (family planning) would make co-pay less. I asked to f/u.

## 2020-05-15 NOTE — Telephone Encounter (Signed)
Pt wants a call back about why her medication has not been authorized through [facility] yet.  Please call the patient back.

## 2020-05-15 NOTE — Telephone Encounter (Signed)
Spoke with patient-pt was uninsured, but now has "pregnancy medicaid" and wanted to see if subutex rx was covered, CMA faxed form (opioid dependence) to medicaid along with last visit. Request pending review.Melinda Spittle Cassady4/19/20224:40 PM  Pt aware.Marland Kitchen

## 2020-05-21 ENCOUNTER — Other Ambulatory Visit (HOSPITAL_COMMUNITY): Payer: Self-pay

## 2020-05-21 ENCOUNTER — Telehealth: Payer: Self-pay | Admitting: *Deleted

## 2020-05-21 NOTE — Telephone Encounter (Signed)
Call to [employer] for results of PA for Subutex Tablets.  Approved 05/16/2020 thru 02/10/2021.  Patient will need to call [phone] for refund of previously paid prescriptions.  Unable to reach pt per phone or to leave a message x 2 . [phone].  Angelina Ok, RN 05/21/2020 9:49 AM.

## 2020-05-22 ENCOUNTER — Other Ambulatory Visit (HOSPITAL_COMMUNITY): Payer: Self-pay

## 2020-05-22 MED FILL — Gabapentin Cap 300 MG: ORAL | 30 days supply | Qty: 90 | Fill #0 | Status: AC

## 2020-05-24 ENCOUNTER — Other Ambulatory Visit (HOSPITAL_COMMUNITY): Payer: Self-pay

## 2020-05-29 ENCOUNTER — Other Ambulatory Visit (HOSPITAL_COMMUNITY): Payer: Self-pay

## 2020-05-29 ENCOUNTER — Other Ambulatory Visit: Payer: Self-pay

## 2020-05-29 ENCOUNTER — Ambulatory Visit (INDEPENDENT_AMBULATORY_CARE_PROVIDER_SITE_OTHER): Payer: Medicaid Other | Admitting: Internal Medicine

## 2020-05-29 VITALS — BP 104/56 | HR 85 | Temp 98.6°F | Wt 186.2 lb

## 2020-05-29 DIAGNOSIS — F1120 Opioid dependence, uncomplicated: Secondary | ICD-10-CM | POA: Diagnosis not present

## 2020-05-29 DIAGNOSIS — F17200 Nicotine dependence, unspecified, uncomplicated: Secondary | ICD-10-CM | POA: Insufficient documentation

## 2020-05-29 DIAGNOSIS — F1121 Opioid dependence, in remission: Secondary | ICD-10-CM | POA: Diagnosis present

## 2020-05-29 MED ORDER — BUPRENORPHINE HCL 8 MG SL SUBL
8.0000 mg | SUBLINGUAL_TABLET | Freq: Three times a day (TID) | SUBLINGUAL | 0 refills | Status: DC
  Filled 2020-05-29: qty 6, 2d supply, fill #0
  Filled 2020-05-29: qty 84, 28d supply, fill #0

## 2020-05-29 MED ORDER — NICOTINE 7 MG/24HR TD PT24
7.0000 mg | MEDICATED_PATCH | TRANSDERMAL | 0 refills | Status: AC
  Filled 2020-05-29: qty 14, 14d supply, fill #0

## 2020-05-29 MED ORDER — BUPRENORPHINE HCL 8 MG SL SUBL
8.0000 mg | SUBLINGUAL_TABLET | Freq: Three times a day (TID) | SUBLINGUAL | 0 refills | Status: DC
  Filled 2020-05-29 (×2): qty 90, 30d supply, fill #0

## 2020-05-29 MED ORDER — NICOTINE 14 MG/24HR TD PT24
14.0000 mg | MEDICATED_PATCH | TRANSDERMAL | 0 refills | Status: AC
  Filled 2020-05-29: qty 42, 42d supply, fill #0

## 2020-05-29 NOTE — Assessment & Plan Note (Signed)
Severe opioid use disorder (HCC) - Treatment is going well. Patient will follow up in 4 weeks.  - buprenorphine (SUBUTEX) 8 MG SUBL SL tablet; Place 1 tablet (8 mg total) under the tongue in the morning, at noon, and at bedtime.  Dispense: 90 tablet; Refill: 0 - ToxAssure Select,+Antidepr,UR

## 2020-05-29 NOTE — Assessment & Plan Note (Signed)
Patient has decrease from 1 ppd to 5-7 cigarettes a day. She would like help with quitting all together.   Assessment: Tobacco use disorder Plan: - nicotine (NICODERM CQ - DOSED IN MG/24 HOURS) 14 mg/24hr patch; Place 1 patch (14 mg total) onto the skin daily.  Dispense: 42 patch; Refill: 0 - nicotine (NICODERM CQ - DOSED IN MG/24 HR) 7 mg/24hr patch; Place 1 patch (7 mg total) onto the skin daily for 14 days.  Dispense: 14 patch; Refill: 0

## 2020-05-29 NOTE — Patient Instructions (Signed)
Thank you for trusting me with your care. To recap, today we discussed the following:   1. Severe opioid use disorder (HCC)  - buprenorphine (SUBUTEX) 8 MG SUBL SL tablet; Place 1 tablet (8 mg total) under the tongue in the morning, at noon, and at bedtime.  Dispense: 90 tablet; Refill: 0  2. Tobacco use disorder  - nicotine (NICODERM CQ - DOSED IN MG/24 HOURS) 14 mg/24hr patch; Place 1 patch (14 mg total) onto the skin daily.  Dispense: 42 patch; Refill: 0 - nicotine (NICODERM CQ - DOSED IN MG/24 HR) 7 mg/24hr patch; Place 1 patch (7 mg total) onto the skin daily for 14 days.  Dispense: 14 patch; Refill: 0

## 2020-05-29 NOTE — Progress Notes (Signed)
   05/29/2020  [name] presents for follow up of opioid use disorder I have reviewed the prior induction visit, follow up visits, and telephone encounters relevant to opiate use disorder (OUD) treatment.   Current daily dose: Buprenorphine 8 mg TID  Date of Induction: 01/10/2020  Current follow up interval, in weeks: Every 2 weeks   The patient has been adherent with the buprenorphine for OUD contract.   Last UDS Result: 03/27/2020 . Appropriate for Buprenorphine and metabolite.   HPI: This is a 25 year old person living with OUD who presents for follow up of her OUD. She reports she has been adherent to Buprenorphine therapy which is controlling cravings well. Denies any relapse and feels she is doing well on Buprenorphine therapy.    Exam:   Vitals:   05/29/20 0850  BP: (!) 104/56  Pulse: 85  Temp: 98.6 F (37 C)  TempSrc: Oral  SpO2: 100%  Weight: 186 lb 3.2 oz (84.5 kg)     General: Pregnant women in NAD Cardiovascular: Normal rate, regular rhythm.  No murmurs, rubs, or gallops Pulmonary : Effort normal, breath sounds normal. No wheezes, rales, or rhonchi Skin: Warm, ecchymosis on left forearm  Psychiatric/Behavioral:  normal mood, normal behavior    Assessment/Plan:  See Problem Based Charting in the Encounters Tab     Albertha Ghee, MD  05/29/2020  9:13 AM

## 2020-05-29 NOTE — Progress Notes (Signed)
Internal Medicine Clinic Attending  Case discussed with Dr. Steen  At the time of the visit.  We reviewed the resident's history and exam and pertinent patient test results.  I agree with the assessment, diagnosis, and plan of care documented in the resident's note.  

## 2020-05-29 NOTE — Addendum Note (Signed)
Addended by: Erlinda Hong T on: 05/29/2020 09:44 AM   Modules accepted: Orders

## 2020-05-30 ENCOUNTER — Other Ambulatory Visit (HOSPITAL_COMMUNITY): Payer: Self-pay

## 2020-06-04 ENCOUNTER — Telehealth: Payer: Self-pay | Admitting: Lactation Services

## 2020-06-04 NOTE — Telephone Encounter (Signed)
Patient called and LM that she needs a letter for court. She reports she needs a note for the attorney that reports she needs to be on bedrest, how far along she is, EDD, Meds, bedrest for swelling or pain.  Called patient back and she asked that I speak with her mom for explanation for the letter.  She has an court appearance on 5/16 and they would like a letter to devery court until after baby is born. Reviewed with patient and her mom that patient needs to be seen in the office as she has not been seen in 2 months.   I spoke with mother and she reports patient is having a lot of lower extremity swelling. She is moving some but is resting when she is having lower abdominal pain and pressure. She is having the pain often. Mom reports that her feet and legs are very swollen at least 3-4 days a week so that you may not see her ankles or knees well. She has some intermittent swelling to her hands, none to face.   Patient denies headache, blurred vision, or dizziness.   Patient is not drinking well as she is nauseated. She is not eating well. She is having a lot of nausea, she is taking Promethazine for nausea.   GM has a BP cuff at home. Had patient sit for 15 minutes with feet on the floor. Could not find batteries to check BP. Advised her mom to call [facility] to see if she can get her BP cuff through Medicaid.   Patient to be seen in the office tomorrow at 3:55. Mom and patient voiced she will be at appointment.

## 2020-06-05 ENCOUNTER — Other Ambulatory Visit: Payer: Self-pay

## 2020-06-05 ENCOUNTER — Ambulatory Visit (INDEPENDENT_AMBULATORY_CARE_PROVIDER_SITE_OTHER): Payer: Medicaid Other | Admitting: Obstetrics and Gynecology

## 2020-06-05 VITALS — BP 119/55 | HR 86 | Wt 177.0 lb

## 2020-06-05 DIAGNOSIS — A749 Chlamydial infection, unspecified: Secondary | ICD-10-CM

## 2020-06-05 DIAGNOSIS — Z23 Encounter for immunization: Secondary | ICD-10-CM | POA: Diagnosis not present

## 2020-06-05 DIAGNOSIS — Z3A27 27 weeks gestation of pregnancy: Secondary | ICD-10-CM

## 2020-06-05 DIAGNOSIS — F1120 Opioid dependence, uncomplicated: Secondary | ICD-10-CM

## 2020-06-05 DIAGNOSIS — Z3400 Encounter for supervision of normal first pregnancy, unspecified trimester: Secondary | ICD-10-CM

## 2020-06-05 DIAGNOSIS — O98812 Other maternal infectious and parasitic diseases complicating pregnancy, second trimester: Secondary | ICD-10-CM

## 2020-06-05 NOTE — Progress Notes (Signed)
   PRENATAL VISIT NOTE  Subjective:  [name] is a 25 y.o. G2P0010 at [redacted]w[redacted]d being seen today for ongoing prenatal care.  She is currently monitored for the following issues for this high-risk pregnancy and has GAD (generalized anxiety disorder); MDD (major depressive disorder), recurrent severe, without psychosis (HCC); Opioid use disorder, severe, in early remission (HCC); Supervision of normal first pregnancy, antepartum; Substance abuse affecting pregnancy, antepartum; Chlamydia infection affecting pregnancy; Ringworm; and Tobacco use disorder on their problem list.  Patient reports swelling and fatigue. Reports that she gets really swollen when she is on her feet. Goes away with rest. No shortness of breath or difficulty breathing. Swelling is bilateral and symmetric. It is not present today she says because she has been laying down all day.  Contractions: Irritability. Vag. Bleeding: None.  Movement: Present. Denies leaking of fluid.    No history of IVDU. No history of endocarditis.   The following portions of the patient's history were reviewed and updated as appropriate: allergies, current medications, past family history, past medical history, past social history, past surgical history and problem list.   Objective:   Vitals:   06/05/20 1552  BP: (!) 119/55  Pulse: 86  Weight: 177 lb (80.3 kg)    Fetal Status:     Movement: Present     General:  Alert, oriented and cooperative. Patient is in no acute distress.  Skin: Skin is warm and dry. No rash noted.   Cardiovascular: Normal heart rate noted  Respiratory: Normal respiratory effort, no problems with respiration noted  Abdomen: Soft, gravid, appropriate for gestational age.  Pain/Pressure: Present     Pelvic: Cervical exam deferred        Extremities: Normal range of motion.  Edema: none  Mental Status: Normal mood and affect. Normal behavior. Normal judgment and thought content.   Assessment and Plan:  Pregnancy:  G2P0010 at [redacted]w[redacted]d 1. Supervision of normal first pregnancy, antepartum -has missed several appointments, will have her return for 2hr gtt and 28 week labs  -no swelling on exam today. Suspect secondary to pregnancy and not alternative cause. Discussed rest, compression socks, etc.   2. [redacted] weeks gestation of pregnancy   3. Severe opioid use disorder (HCC) -Follows with Internal Medicine. On 8 mg subutex TID  4. Chlamydia infection affecting pregnancy in second trimester -Neg TOC 4/18   Preterm labor symptoms and general obstetric precautions including but not limited to vaginal bleeding, contractions, leaking of fluid and fetal movement were reviewed in detail with the patient. Please refer to After Visit Summary for other counseling recommendations.   No follow-ups on file.  Future Appointments  Date Time Provider Department Center  06/12/2020  3:00 PM [facility] NURSE [facility]  06/12/2020  3:15 PM [facility]    Gita Kudo, MD

## 2020-06-06 LAB — TOXASSURE SELECT,+ANTIDEPR,UR

## 2020-06-12 ENCOUNTER — Ambulatory Visit: Payer: Medicaid Other

## 2020-06-12 ENCOUNTER — Ambulatory Visit: Payer: Medicaid Other | Attending: Maternal & Fetal Medicine

## 2020-06-14 ENCOUNTER — Other Ambulatory Visit: Payer: Self-pay | Admitting: Internal Medicine

## 2020-06-14 ENCOUNTER — Other Ambulatory Visit (HOSPITAL_COMMUNITY): Payer: Self-pay

## 2020-06-14 DIAGNOSIS — F411 Generalized anxiety disorder: Secondary | ICD-10-CM

## 2020-06-18 ENCOUNTER — Other Ambulatory Visit: Payer: Self-pay | Admitting: General Practice

## 2020-06-18 DIAGNOSIS — Z3400 Encounter for supervision of normal first pregnancy, unspecified trimester: Secondary | ICD-10-CM

## 2020-06-20 ENCOUNTER — Other Ambulatory Visit (HOSPITAL_COMMUNITY): Payer: Self-pay

## 2020-06-20 ENCOUNTER — Other Ambulatory Visit: Payer: Self-pay | Admitting: Internal Medicine

## 2020-06-20 DIAGNOSIS — F411 Generalized anxiety disorder: Secondary | ICD-10-CM

## 2020-06-21 ENCOUNTER — Telehealth: Payer: Self-pay | Admitting: *Deleted

## 2020-06-21 ENCOUNTER — Other Ambulatory Visit: Payer: Medicaid Other

## 2020-06-21 ENCOUNTER — Telehealth: Payer: Self-pay | Admitting: Family Medicine

## 2020-06-21 ENCOUNTER — Encounter: Payer: Medicaid Other | Admitting: Family Medicine

## 2020-06-21 ENCOUNTER — Other Ambulatory Visit (HOSPITAL_COMMUNITY): Payer: Self-pay

## 2020-06-21 DIAGNOSIS — F411 Generalized anxiety disorder: Secondary | ICD-10-CM

## 2020-06-21 MED ORDER — GABAPENTIN 300 MG PO CAPS
300.0000 mg | ORAL_CAPSULE | Freq: Three times a day (TID) | ORAL | 1 refills | Status: DC
  Filled 2020-06-21: qty 90, 30d supply, fill #0

## 2020-06-21 NOTE — Telephone Encounter (Signed)
Sure. I have sent to the [facility].

## 2020-06-21 NOTE — Telephone Encounter (Signed)
Patient asking MD to refill the Gabapentin until she can get in to see her GYN MD.

## 2020-06-22 ENCOUNTER — Other Ambulatory Visit (HOSPITAL_COMMUNITY): Payer: Self-pay

## 2020-06-26 ENCOUNTER — Other Ambulatory Visit (HOSPITAL_COMMUNITY): Payer: Self-pay

## 2020-06-26 ENCOUNTER — Other Ambulatory Visit: Payer: Self-pay | Admitting: Student in an Organized Health Care Education/Training Program

## 2020-06-26 DIAGNOSIS — F1120 Opioid dependence, uncomplicated: Secondary | ICD-10-CM

## 2020-06-27 ENCOUNTER — Telehealth: Payer: Self-pay

## 2020-06-27 ENCOUNTER — Other Ambulatory Visit (HOSPITAL_COMMUNITY): Payer: Self-pay

## 2020-06-27 ENCOUNTER — Other Ambulatory Visit: Payer: Self-pay | Admitting: Student in an Organized Health Care Education/Training Program

## 2020-06-27 DIAGNOSIS — F1120 Opioid dependence, uncomplicated: Secondary | ICD-10-CM

## 2020-06-27 NOTE — Telephone Encounter (Signed)
Pt is requesting herbuprenorphine (SUBUTEX) 8 MG SUBL SL tablet sent to  [facility] Phone:  [phone]  Fax:  [phone]

## 2020-06-27 NOTE — Telephone Encounter (Signed)
Pt is requesting a call back  Due to the pharmacy had sent in a few med refills and she is completely out of her meds

## 2020-06-27 NOTE — Telephone Encounter (Signed)
Refill request from pharmacy forwarded to OUD attendings. SChaplin, RN,BSN

## 2020-06-28 ENCOUNTER — Other Ambulatory Visit (HOSPITAL_COMMUNITY): Payer: Self-pay

## 2020-06-28 ENCOUNTER — Other Ambulatory Visit: Payer: Self-pay | Admitting: Student in an Organized Health Care Education/Training Program

## 2020-06-28 ENCOUNTER — Encounter: Payer: Medicaid Other | Admitting: Family Medicine

## 2020-06-28 DIAGNOSIS — F1120 Opioid dependence, uncomplicated: Secondary | ICD-10-CM

## 2020-06-28 NOTE — Telephone Encounter (Signed)
Patient's mother calling back for refill on suboxone. FO explained providers have 48 hours to respond. Will forward to OUD Attending Pool.

## 2020-06-29 ENCOUNTER — Other Ambulatory Visit (HOSPITAL_COMMUNITY): Payer: Self-pay

## 2020-06-29 MED ORDER — BUPRENORPHINE HCL 8 MG SL SUBL
8.0000 mg | SUBLINGUAL_TABLET | Freq: Three times a day (TID) | SUBLINGUAL | 0 refills | Status: DC
  Filled 2020-06-29: qty 12, 4d supply, fill #0
  Filled 2020-06-29: qty 78, 26d supply, fill #0

## 2020-06-29 NOTE — Telephone Encounter (Signed)
Patient and mother have called multiple times since 6/1 requesting refill on suboxone. She has been out x 4 days. We did schedule f/u for 07/04/20 at 3:15 with Dr. Sande Brothers.

## 2020-06-29 NOTE — Telephone Encounter (Signed)
Patient's mother notified refill has been sent and she is very Adult nurse.

## 2020-07-02 ENCOUNTER — Other Ambulatory Visit (HOSPITAL_COMMUNITY): Payer: Self-pay

## 2020-07-02 ENCOUNTER — Encounter: Payer: Medicaid Other | Admitting: Family Medicine

## 2020-07-04 ENCOUNTER — Other Ambulatory Visit: Payer: Self-pay

## 2020-07-04 ENCOUNTER — Ambulatory Visit (INDEPENDENT_AMBULATORY_CARE_PROVIDER_SITE_OTHER): Payer: Medicaid Other | Admitting: Internal Medicine

## 2020-07-04 ENCOUNTER — Encounter: Payer: Self-pay | Admitting: Internal Medicine

## 2020-07-04 DIAGNOSIS — F1121 Opioid dependence, in remission: Secondary | ICD-10-CM | POA: Diagnosis not present

## 2020-07-04 NOTE — Patient Instructions (Signed)
To [name],  It was a pleasure seeing you again! Today we talked about your Subutex. I am glad you are continuing to do well on your current dose. We will continue to have you follow up with [place] in 4 weeks time. Have a good day!  Dolan Amen, MD

## 2020-07-04 NOTE — Progress Notes (Signed)
   07/04/2020  [name] presents for follow up of opioid use disorder I have reviewed the prior induction visit, follow up visits, and telephone encounters relevant to opiate use disorder (OUD) treatment.   Current daily dose: subutex 8 mg SL tab TID  Date of Induction: 01/10/20  Current follow up interval, in weeks: 4 weeks  The patient has been adherent with the buprenorphine for OUD contract.   Last UDS Result: Expected  HPI:  [name] states that she is doing well today. She has been compliant with her medications, although she was out of them for 4 days. During those days she felt symptomatic with nausea, chills, and fatigue. She denies relapse, and her symptoms were well controlled when her prescription was called in.   Exam:   Vitals:   07/04/20 1530  BP: 103/61  Pulse: 97  Temp: 98.2 F (36.8 C)  TempSrc: Oral  SpO2: 100%  Weight: 188 lb 8 oz (85.5 kg)  Height: 5\' 3"  (1.6 m)    Physical Exam Constitutional:      General: She is not in acute distress.    Appearance: Normal appearance. She is not ill-appearing, toxic-appearing or diaphoretic.  Cardiovascular:     Rate and Rhythm: Normal rate and regular rhythm.     Pulses: Normal pulses.     Heart sounds: Normal heart sounds. No murmur heard.   No friction rub. No gallop.  Pulmonary:     Effort: Pulmonary effort is normal. No respiratory distress.     Breath sounds: Normal breath sounds. No wheezing.  Abdominal:     General: There is no distension.  Neurological:     Mental Status: She is alert and oriented to person, place, and time.  Psychiatric:        Mood and Affect: Mood normal.        Behavior: Behavior normal.     Assessment/Plan:  See Problem Based Charting in the Encounters Tab     , MD  07/04/2020  3:32 PM

## 2020-07-05 ENCOUNTER — Encounter: Payer: Self-pay | Admitting: Internal Medicine

## 2020-07-05 NOTE — Assessment & Plan Note (Signed)
Continues to do well on treatment with no relapses. She is currently pregnant. Her baby (boy) is due in August. Will continue her current medication, have an in person visit in July, then telehealth as she settles with her newborn.  - Continue Subutex 8 mg sublingual tablet three times daily. Was prescribed on 07/02/20. - Last Utox reassuring. She is prescribed Flexeril.  - F/U in 4 weeks.

## 2020-07-06 NOTE — Progress Notes (Signed)
Internal Medicine Clinic Attending ? ?Case discussed with Dr. Winters  At the time of the visit.  We reviewed the resident?s history and exam and pertinent patient test results.  I agree with the assessment, diagnosis, and plan of care documented in the resident?s note.  ?

## 2020-07-11 ENCOUNTER — Other Ambulatory Visit (HOSPITAL_COMMUNITY): Payer: Self-pay

## 2020-07-11 ENCOUNTER — Other Ambulatory Visit: Payer: Self-pay

## 2020-07-11 ENCOUNTER — Ambulatory Visit (INDEPENDENT_AMBULATORY_CARE_PROVIDER_SITE_OTHER): Payer: Medicaid Other | Admitting: Family Medicine

## 2020-07-11 ENCOUNTER — Other Ambulatory Visit: Payer: Medicaid Other

## 2020-07-11 VITALS — BP 110/72 | HR 92 | Wt 191.1 lb

## 2020-07-11 DIAGNOSIS — Z3400 Encounter for supervision of normal first pregnancy, unspecified trimester: Secondary | ICD-10-CM

## 2020-07-11 DIAGNOSIS — K219 Gastro-esophageal reflux disease without esophagitis: Secondary | ICD-10-CM

## 2020-07-11 DIAGNOSIS — F17200 Nicotine dependence, unspecified, uncomplicated: Secondary | ICD-10-CM

## 2020-07-11 DIAGNOSIS — O98813 Other maternal infectious and parasitic diseases complicating pregnancy, third trimester: Secondary | ICD-10-CM

## 2020-07-11 DIAGNOSIS — F1121 Opioid dependence, in remission: Secondary | ICD-10-CM

## 2020-07-11 DIAGNOSIS — A749 Chlamydial infection, unspecified: Secondary | ICD-10-CM

## 2020-07-11 DIAGNOSIS — F411 Generalized anxiety disorder: Secondary | ICD-10-CM

## 2020-07-11 DIAGNOSIS — F332 Major depressive disorder, recurrent severe without psychotic features: Secondary | ICD-10-CM

## 2020-07-11 DIAGNOSIS — Q270 Congenital absence and hypoplasia of umbilical artery: Secondary | ICD-10-CM | POA: Insufficient documentation

## 2020-07-11 MED ORDER — OMEPRAZOLE 20 MG PO CPDR
20.0000 mg | DELAYED_RELEASE_CAPSULE | Freq: Every day | ORAL | 5 refills | Status: DC
  Filled 2020-07-11 – 2020-08-24 (×2): qty 30, 30d supply, fill #0

## 2020-07-11 MED ORDER — GABAPENTIN 300 MG PO CAPS
300.0000 mg | ORAL_CAPSULE | Freq: Three times a day (TID) | ORAL | 2 refills | Status: DC
Start: 2020-07-11 — End: 2020-11-06
  Filled 2020-07-11 – 2020-07-26 (×2): qty 90, 30d supply, fill #0
  Filled 2020-08-23: qty 90, 30d supply, fill #1
  Filled 2020-09-21: qty 90, 30d supply, fill #2

## 2020-07-11 NOTE — Patient Instructions (Addendum)
AREA PEDIATRIC/FAMILY PRACTICE PHYSICIANS  [facility] [address] [place], [place] [address] Phone - [phone]   Fax - [phone]  [facility] [address] [place], [place]  [address] Phone - [phone]   Fax - [phone]  [facility] [address] [place], [place]  [address] Phone - [phone]   Fax - [phone]  [facility] [address] [place], [place]  [address] Phone - [phone]   Fax - [phone]  [facility] [address] [place], [place]  [address] Phone - [phone]   Fax - [phone]  [facility] [address] [place], [place]  [address] Phone - [phone]   Fax - [phone]  [facility] [address] [place], [place]  [address] Phone - [phone]   Fax - [phone]  [facility] AT [facility] [address] [place], [address] [place], [place]  [address] Phone - [phone]   Fax - [phone]  [facility] AT [facility] [address] [place], [place]  [address] Phone - [phone]   Fax - [phone] [facility] [place] [address] [place], [place]  [address] Phone - [phone]   Fax - [phone]  [facility] [address] [place], [place]  [address] Phone - [phone]   Fax - [phone]  [facility] [address], [address] [place], [place]  [address] Phone - [phone]   Fax - [phone]  [facility] [place] [address] [place], [place]  [address] Phone - [phone]   Fax - [phone]  [facility] [address], [address] [place], [place]  [address] Phone - [phone]  [facility] [address] [place], [place]  [address] Phone - [phone]   Fax - [phone]  [facility] [address] [place], [place]  [address] Phone - [phone]   Fax - [phone]  [facility] [address] [place], [place]  [address] Phone - [phone]   Fax - [phone]  [facility] [address] [place], [place]  [address] Phone - [phone]   Fax - [phone]   [facility] [address] [place], [place]  [address] Phone - [phone]   Fax - [phone]  [facility] [address] [place], [place]  [address] Phone - [phone]   Fax - [phone]  DAVID RUBIN [address] [place], [place]  [address] Phone - [phone]   Fax - [phone]  [facility] [address] [place], [place]  [address] Phone - [phone]   Fax - [phone]  [facility] [address] [place], [place]  [address] Phone - [phone]   Fax - [phone] Gerarda Fraction [address] [place], [place]  [address] Phone - [phone]   Fax - [phone]  [facility] [address] [place], [place]  [address] Phone - [phone]   Fax - [phone]  [facility] [place] [address] [place], [place]  [address] Phone - [phone]   Fax - [phone]     Contraception Choices Contraception, also called birth control, refers to methods or devices thatprevent pregnancy. Hormonal methods  Contraceptive implant A contraceptive implant is a thin, plastic tube that contains a hormone  that prevents pregnancy. It is different from an intrauterine device (IUD). It is inserted into the upper part of the arm by a health care provider. Implants canbe effective for up to 3 years. Progestin-only injections Progestin-only injections are injections of progestin, a synthetic form of thehormone progesterone. They are given every 3 months by a health care provider. Birth control  pills Birth control pills are pills that contain hormones that prevent pregnancy. They must be taken once a day, preferably at the same time each day. Aprescription is needed to use this method of contraception. Birth control patch The birth control patch contains hormones that prevent pregnancy. It is placed on the skin and must be changed once a week for three weeks and removed on thefourth week. A prescription is needed to use this method of contraception. Vaginal ring A vaginal ring contains hormones that prevent pregnancy. It is placed in the vagina for three weeks and removed on the fourth week. After that, the process is repeated with a new ring. A prescription is needed to use this method ofcontraception. Emergency contraceptive Emergency contraceptives prevent pregnancy after unprotected sex. They come in pill form and can be taken up to 5 days after sex. They work best the sooner they are taken after having sex. Most emergency contraceptives are available without a prescription. This method should not be used as your only form ofbirth control. Barrier methods  Female condom A female condom is a thin sheath that is worn over the penis during sex. Condoms keep sperm from going inside a woman's body. They can be used with a sperm-killing substance (spermicide) to increase their effectiveness. They should be thrown away after one use. Female condom A female condom is a soft, loose-fitting sheath that is put into the vagina before sex. The condom keeps sperm from going inside a woman's body. Theyshould be thrown away after one use. Diaphragm A diaphragm is a soft, dome-shaped barrier. It is inserted into the vagina before sex, along with a spermicide. The diaphragm blocks sperm from entering the uterus, and the spermicide kills sperm. A diaphragm should be left in thevagina for 6-8 hours after sex and removed within 24 hours. A diaphragm is prescribed and fitted by a health care provider. A  diaphragm should be replaced every 1-2 years, after giving birth, after gaining more than15 lb (6.8 kg), and after pelvic surgery. Cervical cap A cervical cap is a round, soft latex or plastic cup that fits over the cervix. It is inserted into the vagina before sex, along with spermicide. It blocks sperm from entering the uterus. The cap should be left in place for 6-8 hours after sex and removed within 48 hours. A cervical cap must be prescribed andfitted by a health care provider. It should be replaced every 2 years. Sponge A sponge is a soft, circular piece of polyurethane foam with spermicide in it. The sponge helps block sperm from entering the uterus, and the spermicide kills sperm. To use it, you make it wet and then insert it into the vagina. It should be inserted before sex, left in for at least 6 hours after sex, and removed andthrown away within 30 hours. Spermicides Spermicides are chemicals that kill or block sperm from entering the cervix and uterus. They can come as a cream, jelly, suppository, foam, or tablet. A spermicide should be inserted into the vagina with an applicator at least 10-15 minutes before sex to allow time for it to work. The process must be repeatedevery  you have sex. Spermicides do not require a prescription. Intrauterine contraception Intrauterine device (IUD) An IUD is a T-shaped device that is put in a woman's uterus. There are two types: Hormone IUD.This type contains progestin, a synthetic form of the hormone progesterone. This type can stay in place for 3-5 years. Copper IUD.This type is wrapped in copper wire. It can stay in place for 10 years. Permanent methods of contraception Female tubal ligation In this method, a woman's fallopian tubes are sealed, tied, or blocked duringsurgery to prevent eggs from traveling to the uterus. Hysteroscopic sterilization In this method, a small, flexible insert is placed into each fallopian tube. The inserts cause scar  tissue to form in the fallopian tubes and block them, so sperm cannot reach an egg. The procedure takes about 3 months to be effective.Another form of birth control must be used during those 3 months. Female sterilization This is a procedure to tie off the tubes that carry sperm (vasectomy). After the procedure, the man can still ejaculate fluid (semen). Another form of birth control must be used for 3 months after the procedure. Natural planning methods Natural family planning In this method, a couple does not have sex on days when the woman could become pregnant. Calendar method In this method, the woman keeps track of the length of each menstrual cycle, identifies the days when pregnancy can happen, and does not have sex on those days. Ovulation method In this method, a couple avoids sex during ovulation. Symptothermal method This method involves not having sex during ovulation. The woman typically checks for ovulation bywatching changes in her temperature and in the consistency of cervical mucus. Post-ovulation method In this method, a couple waits to have sex until after ovulation. Where to find more information Centers for Disease Control and Prevention: www.cdc.gov Summary Contraception, also called birth control, refers to methods or devices that prevent pregnancy. Hormonal methods of contraception include implants, injections, pills, patches, vaginal rings, and emergency contraceptives. Barrier methods of contraception can include female condoms, female condoms, diaphragms, cervical caps, sponges, and spermicides. There are two types of IUDs (intrauterine devices). An IUD can be put in a woman's uterus to prevent pregnancy for 3-5 years. Permanent sterilization can be done through a procedure for males and females. Natural family planning methods involve nothaving sex on days when the woman could become pregnant. This information is not intended to replace advice given to you by your  health care provider. Make sure you discuss any questions you have with your healthcare provider. Document Revised: 06/20/2019 Document Reviewed: 06/20/2019 Elsevier Patient Education  2022 Elsevier Inc.  

## 2020-07-11 NOTE — Progress Notes (Signed)
   Subjective:  [name] is a 25 y.o. G2P0010 at [redacted]w[redacted]d being seen today for ongoing prenatal care.  She is currently monitored for the following issues for this high-risk pregnancy and has GAD (generalized anxiety disorder); MDD (major depressive disorder), recurrent severe, without psychosis (HCC); Opioid use disorder, severe, in early remission (HCC); Supervision of normal first pregnancy, antepartum; Substance abuse affecting pregnancy, antepartum; Chlamydia infection affecting pregnancy; and Tobacco use disorder on their problem list.  Patient reports  pressure and discomfort .  Contractions: Irritability. Vag. Bleeding: None.  Movement: Present. Denies leaking of fluid.   The following portions of the patient's history were reviewed and updated as appropriate: allergies, current medications, past family history, past medical history, past social history, past surgical history and problem list. Problem list updated.  Objective:   Vitals:   07/11/20 0850  BP: 110/72  Pulse: 92  Weight: 191 lb 1.6 oz (86.7 kg)    Fetal Status: Fetal Heart Rate (bpm): 134   Movement: Present     General:  Alert, oriented and cooperative. Patient is in no acute distress.  Skin: Skin is warm and dry. No rash noted.   Cardiovascular: Normal heart rate noted  Respiratory: Normal respiratory effort, no problems with respiration noted  Abdomen: Soft, gravid, appropriate for gestational age. Pain/Pressure: Present     Pelvic: Vag. Bleeding: None Vag D/C Character: Watery   Cervical exam deferred        Extremities: Normal range of motion.  Edema: Trace  Mental Status: Normal mood and affect. Normal behavior. Normal judgment and thought content.   Urinalysis:      Assessment and Plan:  Pregnancy: G2P0010 at [redacted]w[redacted]d  1. Supervision of normal first pregnancy, antepartum BP and FHR normal Given list of peds practices to choose from Discussed contraception, will think about a LARC PPI sent for GERD  symptoms  2. Opioid use disorder, severe, in early remission (HCC) Follows w IM clinic Subutex 8mg  TID UDS have been appropriate Stable   3. MDD (major depressive disorder), recurrent severe, without psychosis (HCC) Reports she takes gabapentin primarily for anxiety and that it helps a lot Discussed unknown risks on fetus and for the most part irrelevant at this point as she is well past organogenesis stage and has been taking throughout pregnancy Refill provided  4. Chlamydia infection affecting pregnancy in third trimester TOC neg on 05/14/2020  5. Tobacco use disorder Cutting back, using patches and vaping more Encouraged to continue progress towards quitting  6. Single umbilical artery Needs follow up growth 05/16/2020 with MFM Will schedule today  Preterm labor symptoms and general obstetric precautions including but not limited to vaginal bleeding, contractions, leaking of fluid and fetal movement were reviewed in detail with the patient. Please refer to After Visit Summary for other counseling recommendations.  Return in 2 weeks (on 07/25/2020).   07/27/2020, MD

## 2020-07-12 ENCOUNTER — Telehealth: Payer: Self-pay | Admitting: Lactation Services

## 2020-07-12 ENCOUNTER — Other Ambulatory Visit (HOSPITAL_COMMUNITY): Payer: Self-pay

## 2020-07-12 ENCOUNTER — Other Ambulatory Visit: Payer: Self-pay | Admitting: Family Medicine

## 2020-07-12 DIAGNOSIS — O99013 Anemia complicating pregnancy, third trimester: Secondary | ICD-10-CM | POA: Insufficient documentation

## 2020-07-12 LAB — RPR: RPR Ser Ql: NONREACTIVE

## 2020-07-12 LAB — HIV ANTIBODY (ROUTINE TESTING W REFLEX): HIV Screen 4th Generation wRfx: NONREACTIVE

## 2020-07-12 LAB — CBC
Hematocrit: 29.4 % — ABNORMAL LOW (ref 34.0–46.6)
Hemoglobin: 9.6 g/dL — ABNORMAL LOW (ref 11.1–15.9)
MCH: 27.8 pg (ref 26.6–33.0)
MCHC: 32.7 g/dL (ref 31.5–35.7)
MCV: 85 fL (ref 79–97)
Platelets: 180 10*3/uL (ref 150–450)
RBC: 3.45 x10E6/uL — ABNORMAL LOW (ref 3.77–5.28)
RDW: 12.9 % (ref 11.7–15.4)
WBC: 8.2 10*3/uL (ref 3.4–10.8)

## 2020-07-12 MED ORDER — FERROUS SULFATE 325 (65 FE) MG PO TABS
325.0000 mg | ORAL_TABLET | ORAL | 1 refills | Status: DC
  Filled 2020-07-12: qty 60, 120d supply, fill #0

## 2020-07-12 NOTE — Telephone Encounter (Signed)
-----   Message from Federico Flake, MD sent at 07/12/2020  9:15 AM EDT ----- Patient with new onset anemia. Sent in ferrous sulfate. If patient unable to tolerate oral meds would recommend IV iron.

## 2020-07-12 NOTE — Telephone Encounter (Signed)
Called patient, voice mail not set up, so not able to leave a message. No other contact information seen in chart.   My Chart message was sent to patient by Dr. Alvester Morin.   My Chart message sent

## 2020-07-18 ENCOUNTER — Other Ambulatory Visit (HOSPITAL_COMMUNITY): Payer: Self-pay

## 2020-07-20 ENCOUNTER — Other Ambulatory Visit (HOSPITAL_COMMUNITY): Payer: Self-pay

## 2020-07-23 ENCOUNTER — Ambulatory Visit: Payer: Medicaid Other | Attending: Maternal & Fetal Medicine

## 2020-07-23 ENCOUNTER — Ambulatory Visit: Payer: Medicaid Other | Admitting: *Deleted

## 2020-07-23 ENCOUNTER — Encounter: Payer: Self-pay | Admitting: *Deleted

## 2020-07-23 ENCOUNTER — Other Ambulatory Visit: Payer: Self-pay

## 2020-07-23 VITALS — BP 116/64 | HR 97

## 2020-07-23 DIAGNOSIS — Z3400 Encounter for supervision of normal first pregnancy, unspecified trimester: Secondary | ICD-10-CM | POA: Diagnosis present

## 2020-07-23 DIAGNOSIS — O99320 Drug use complicating pregnancy, unspecified trimester: Secondary | ICD-10-CM | POA: Insufficient documentation

## 2020-07-23 DIAGNOSIS — Q270 Congenital absence and hypoplasia of umbilical artery: Secondary | ICD-10-CM | POA: Diagnosis present

## 2020-07-23 DIAGNOSIS — O98813 Other maternal infectious and parasitic diseases complicating pregnancy, third trimester: Secondary | ICD-10-CM | POA: Insufficient documentation

## 2020-07-23 DIAGNOSIS — O99323 Drug use complicating pregnancy, third trimester: Secondary | ICD-10-CM | POA: Insufficient documentation

## 2020-07-23 DIAGNOSIS — O3663X0 Maternal care for excessive fetal growth, third trimester, not applicable or unspecified: Secondary | ICD-10-CM | POA: Diagnosis not present

## 2020-07-23 DIAGNOSIS — A749 Chlamydial infection, unspecified: Secondary | ICD-10-CM

## 2020-07-23 DIAGNOSIS — O289 Unspecified abnormal findings on antenatal screening of mother: Secondary | ICD-10-CM | POA: Diagnosis not present

## 2020-07-23 DIAGNOSIS — F1120 Opioid dependence, uncomplicated: Secondary | ICD-10-CM | POA: Diagnosis not present

## 2020-07-23 DIAGNOSIS — Z3A34 34 weeks gestation of pregnancy: Secondary | ICD-10-CM | POA: Insufficient documentation

## 2020-07-23 DIAGNOSIS — Z79891 Long term (current) use of opiate analgesic: Secondary | ICD-10-CM

## 2020-07-23 DIAGNOSIS — O403XX0 Polyhydramnios, third trimester, not applicable or unspecified: Secondary | ICD-10-CM | POA: Diagnosis present

## 2020-07-23 DIAGNOSIS — O99333 Smoking (tobacco) complicating pregnancy, third trimester: Secondary | ICD-10-CM

## 2020-07-23 DIAGNOSIS — F17210 Nicotine dependence, cigarettes, uncomplicated: Secondary | ICD-10-CM

## 2020-07-23 DIAGNOSIS — Z148 Genetic carrier of other disease: Secondary | ICD-10-CM

## 2020-07-23 NOTE — Progress Notes (Unsigned)
MFM Note  This patient was seen for a follow up growth scan due to a fetus with a two vessel cord and maternal treatment with Subutex.  She reports that she has screened negative for gestational diabetes in her pregnancy.  She was informed that the fetal growth measures greater than the 99 with percentile for her gestational age.  Mild polyhydramnios with a total AFI of over 25 cm.  Due to a large for gestational fetus and polyhydramnios noted today, we will start weekly biophysical profiles next week and continue these tests until delivery.  We will reassess the fetal growth in 3 weeks.  Should a large for gestational age fetus continue to be noted at that exam, delivery is recommended at around 39 weeks in order to increase her chances of having a successful vaginal delivery.  The patient understands that a vaginal delivery may be attempted in someone without diabetes should the EFW be less than 5000 g.   A total of 15 minutes was spent counseling and coordinating the care for this patient.  Greater than 50% of the time was spent in direct face-to-face contact.

## 2020-07-24 ENCOUNTER — Other Ambulatory Visit: Payer: Self-pay | Admitting: *Deleted

## 2020-07-24 DIAGNOSIS — O3663X0 Maternal care for excessive fetal growth, third trimester, not applicable or unspecified: Secondary | ICD-10-CM

## 2020-07-25 ENCOUNTER — Other Ambulatory Visit (HOSPITAL_COMMUNITY): Payer: Self-pay

## 2020-07-25 MED FILL — Promethazine HCl Tab 25 MG: ORAL | 14 days supply | Qty: 28 | Fill #0 | Status: AC

## 2020-07-26 ENCOUNTER — Other Ambulatory Visit (HOSPITAL_COMMUNITY): Payer: Self-pay

## 2020-07-26 ENCOUNTER — Ambulatory Visit (INDEPENDENT_AMBULATORY_CARE_PROVIDER_SITE_OTHER): Payer: Medicaid Other | Admitting: Student

## 2020-07-26 ENCOUNTER — Telehealth: Payer: Self-pay

## 2020-07-26 DIAGNOSIS — F1120 Opioid dependence, uncomplicated: Secondary | ICD-10-CM

## 2020-07-26 DIAGNOSIS — F1121 Opioid dependence, in remission: Secondary | ICD-10-CM | POA: Diagnosis not present

## 2020-07-26 MED ORDER — BUPRENORPHINE HCL 8 MG SL SUBL
8.0000 mg | SUBLINGUAL_TABLET | Freq: Three times a day (TID) | SUBLINGUAL | 0 refills | Status: DC
  Filled 2020-07-26: qty 90, 30d supply, fill #0

## 2020-07-26 NOTE — Telephone Encounter (Signed)
Pt mother requesting gabapentin to be filled by today. Please call back.

## 2020-07-27 ENCOUNTER — Other Ambulatory Visit (HOSPITAL_COMMUNITY): Payer: Self-pay

## 2020-07-27 ENCOUNTER — Encounter: Payer: Medicaid Other | Admitting: Family Medicine

## 2020-07-27 NOTE — Patient Instructions (Signed)
It was a pleasure seeing you in clinic. Today I have sent in a new script for your Subetex that you can fill before this holiday weekend.  If you have any questions or concerns, please call our clinic at [phone] between 9am-5pm and after hours call [phone] and ask for the internal medicine resident on call. If you feel you are having a medical emergency please call 911.   Thank you, we look forward to helping you remain healthy!

## 2020-07-28 NOTE — Progress Notes (Signed)
  07/26/2020   [name] presents for follow up of opioid use disorder I have reviewed the prior induction visit, follow up visits, and telephone encounters relevant to opiate use disorder (OUD) treatment.   Current daily dose: subutex 8 mg SL tab TID   Date of Induction: 01/10/20   Current follow up interval, in weeks: 4 weeks   The patient has been adherent with the buprenorphine for OUD contract.   Last UDS Result: Expected HPI:  [name] is a 25 y.o.   Past Medical History:  Diagnosis Date   ADHD (attention deficit hyperactivity disorder)    Anesthesia complication    woke up fighting after tonsillectomy   Anxiety    Cholecystitis    Complication of anesthesia    when wakes up " freaks out- like panic attack"   Depression    Dysmenorrhea    GERD (gastroesophageal reflux disease)    Headache    migraines   Hyperlipidemia    Hypoglycemia    Polysubstance abuse (HCC)    Pregnancy complicated by subutex maintenance, antepartum (HCC)    Syncope    Viral warts    hand   Vision abnormalities    wears glasses   Review of Systems:  negative as per HPI  Physical Exam:  Vitals:   07/26/20 1504  BP: 111/62  Pulse: 100  Temp: 98 F (36.7 C)  TempSrc: Oral  SpO2: 100%  Weight: 197 lb 1.6 oz (89.4 kg)  Height: 5\' 3"  (1.6 m)   Physical Exam Vitals reviewed.  Constitutional:      Appearance: Normal appearance.  HENT:     Head: Normocephalic and atraumatic.     Right Ear: External ear normal.     Left Ear: External ear normal.     Mouth/Throat:     Mouth: Mucous membranes are moist.     Pharynx: Oropharynx is clear.  Eyes:     Extraocular Movements: Extraocular movements intact.     Pupils: Pupils are equal, round, and reactive to light.  Cardiovascular:     Rate and Rhythm: Normal rate and regular rhythm.     Heart sounds: No murmur heard. Pulmonary:     Effort: Pulmonary effort is normal.     Breath sounds: Normal breath sounds. No wheezing.   Abdominal:     General: Bowel sounds are normal.     Comments: gravid  Musculoskeletal:        General: No swelling or deformity. Normal range of motion.  Skin:    General: Skin is warm and dry.     Capillary Refill: Capillary refill takes less than 2 seconds.  Neurological:     General: No focal deficit present.     Mental Status: She is alert and oriented to person, place, and time.  Psychiatric:        Mood and Affect: Mood normal.        Behavior: Behavior normal.     Assessment & Plan:   See Encounters Tab for problem based charting.  Patient discussed with Dr. 

## 2020-08-03 ENCOUNTER — Other Ambulatory Visit: Payer: Medicaid Other

## 2020-08-07 NOTE — Assessment & Plan Note (Addendum)
Patient reports doing well. States she has had no withdrawal symptoms, cravings between doses, or relapses. Anxious about refill due to upcoming holiday weekend scheduling. Reports withdrawal symptoms when she ran out of script and could not pick up medication for a few days a month ago. Tentative plans of induction of labor in second half of July.  - Continue subutex 8 mg three times daily. Prescribed on 6/30 so she can pick up script before holiday weekend.  - Telehealth follow up in 2 month after delivery

## 2020-08-08 ENCOUNTER — Other Ambulatory Visit: Payer: Self-pay

## 2020-08-13 ENCOUNTER — Encounter: Payer: Self-pay | Admitting: *Deleted

## 2020-08-13 ENCOUNTER — Other Ambulatory Visit (HOSPITAL_COMMUNITY): Payer: Self-pay

## 2020-08-13 ENCOUNTER — Ambulatory Visit: Payer: Medicaid Other | Admitting: *Deleted

## 2020-08-13 ENCOUNTER — Other Ambulatory Visit: Payer: Self-pay

## 2020-08-13 ENCOUNTER — Ambulatory Visit: Payer: Medicaid Other | Attending: Obstetrics

## 2020-08-13 VITALS — BP 109/54 | HR 72

## 2020-08-13 DIAGNOSIS — F17210 Nicotine dependence, cigarettes, uncomplicated: Secondary | ICD-10-CM

## 2020-08-13 DIAGNOSIS — Z3400 Encounter for supervision of normal first pregnancy, unspecified trimester: Secondary | ICD-10-CM | POA: Insufficient documentation

## 2020-08-13 DIAGNOSIS — F1120 Opioid dependence, uncomplicated: Secondary | ICD-10-CM | POA: Diagnosis not present

## 2020-08-13 DIAGNOSIS — O99323 Drug use complicating pregnancy, third trimester: Secondary | ICD-10-CM

## 2020-08-13 DIAGNOSIS — Z362 Encounter for other antenatal screening follow-up: Secondary | ICD-10-CM

## 2020-08-13 DIAGNOSIS — O3663X0 Maternal care for excessive fetal growth, third trimester, not applicable or unspecified: Secondary | ICD-10-CM

## 2020-08-13 DIAGNOSIS — O98813 Other maternal infectious and parasitic diseases complicating pregnancy, third trimester: Secondary | ICD-10-CM | POA: Insufficient documentation

## 2020-08-13 DIAGNOSIS — A749 Chlamydial infection, unspecified: Secondary | ICD-10-CM | POA: Insufficient documentation

## 2020-08-13 DIAGNOSIS — O99320 Drug use complicating pregnancy, unspecified trimester: Secondary | ICD-10-CM | POA: Diagnosis present

## 2020-08-13 DIAGNOSIS — Z148 Genetic carrier of other disease: Secondary | ICD-10-CM

## 2020-08-13 DIAGNOSIS — O289 Unspecified abnormal findings on antenatal screening of mother: Secondary | ICD-10-CM | POA: Diagnosis not present

## 2020-08-13 DIAGNOSIS — Z79891 Long term (current) use of opiate analgesic: Secondary | ICD-10-CM

## 2020-08-13 DIAGNOSIS — O99333 Smoking (tobacco) complicating pregnancy, third trimester: Secondary | ICD-10-CM

## 2020-08-13 DIAGNOSIS — Z3A37 37 weeks gestation of pregnancy: Secondary | ICD-10-CM

## 2020-08-13 MED FILL — Promethazine HCl Tab 25 MG: ORAL | 14 days supply | Qty: 28 | Fill #1 | Status: CN

## 2020-08-13 NOTE — Progress Notes (Signed)
Internal Medicine Clinic Attending ? ?Case discussed with Dr. Liang  At the time of the visit.  We reviewed the resident?s history and exam and pertinent patient test results.  I agree with the assessment, diagnosis, and plan of care documented in the resident?s note. ? ?

## 2020-08-17 ENCOUNTER — Other Ambulatory Visit: Payer: Self-pay | Admitting: Internal Medicine

## 2020-08-17 ENCOUNTER — Other Ambulatory Visit (HOSPITAL_COMMUNITY): Payer: Self-pay

## 2020-08-17 DIAGNOSIS — F1120 Opioid dependence, uncomplicated: Secondary | ICD-10-CM

## 2020-08-17 MED ORDER — BUPRENORPHINE HCL 8 MG SL SUBL
8.0000 mg | SUBLINGUAL_TABLET | Freq: Three times a day (TID) | SUBLINGUAL | 0 refills | Status: DC
  Filled 2020-08-17 – 2020-08-24 (×3): qty 90, 30d supply, fill #0

## 2020-08-17 NOTE — Telephone Encounter (Signed)
Last appt was 07/26/20.

## 2020-08-21 ENCOUNTER — Other Ambulatory Visit: Payer: Self-pay

## 2020-08-21 ENCOUNTER — Other Ambulatory Visit (HOSPITAL_COMMUNITY): Payer: Self-pay

## 2020-08-21 ENCOUNTER — Inpatient Hospital Stay (HOSPITAL_COMMUNITY)
Admission: AD | Admit: 2020-08-21 | Discharge: 2020-08-21 | Disposition: A | Discharge status: Home / Self Care | Payer: Medicaid Other | Attending: Obstetrics and Gynecology | Admitting: Obstetrics and Gynecology

## 2020-08-21 ENCOUNTER — Encounter (HOSPITAL_COMMUNITY): Payer: Self-pay | Admitting: Obstetrics and Gynecology

## 2020-08-21 DIAGNOSIS — O99320 Drug use complicating pregnancy, unspecified trimester: Secondary | ICD-10-CM

## 2020-08-21 DIAGNOSIS — A749 Chlamydial infection, unspecified: Secondary | ICD-10-CM

## 2020-08-21 DIAGNOSIS — Z3A39 39 weeks gestation of pregnancy: Secondary | ICD-10-CM | POA: Insufficient documentation

## 2020-08-21 DIAGNOSIS — Z3400 Encounter for supervision of normal first pregnancy, unspecified trimester: Secondary | ICD-10-CM

## 2020-08-21 DIAGNOSIS — O471 False labor at or after 37 completed weeks of gestation: Secondary | ICD-10-CM | POA: Insufficient documentation

## 2020-08-21 DIAGNOSIS — Z3A38 38 weeks gestation of pregnancy: Secondary | ICD-10-CM | POA: Diagnosis not present

## 2020-08-21 HISTORY — DX: Endometriosis, unspecified: N80.9

## 2020-08-21 MED ORDER — ONDANSETRON 4 MG PO TBDP
4.0000 mg | ORAL_TABLET | Freq: Once | ORAL | Status: AC
  Administered 2020-08-21: 4 mg via ORAL
  Filled 2020-08-21: qty 1

## 2020-08-21 MED FILL — Promethazine HCl Tab 25 MG: ORAL | 14 days supply | Qty: 28 | Fill #1 | Status: AC

## 2020-08-21 NOTE — MAU Provider Note (Signed)
[name] is a G2P0010 at [redacted]w[redacted]d seen in MAU for labor. RN labor check, not seen by provider. SVE by RN Dilation: Fingertip Cervical Position: Posterior Station: Ballotable Exam by:: K WIlson  Unchanged after 1 hour+ observation  REACTIVE NST - FHR: 135 bpm / moderate variability / accels present / decels absent / TOCO: irregular UC's with UI    Plan:  D/C home with labor precautions Keep scheduled appt with [facility] on 08/22/2020  Raelyn Mora, CNM  08/21/2020 11:24 AM

## 2020-08-21 NOTE — MAU Note (Signed)
Contractions started this morning. Denies ROM or bleeding.   Has been throwing up , keeps asking for something for nausea.

## 2020-08-22 ENCOUNTER — Ambulatory Visit (INDEPENDENT_AMBULATORY_CARE_PROVIDER_SITE_OTHER): Payer: Medicaid Other | Admitting: Obstetrics and Gynecology

## 2020-08-22 ENCOUNTER — Other Ambulatory Visit: Payer: Self-pay | Admitting: Advanced Practice Midwife

## 2020-08-22 ENCOUNTER — Ambulatory Visit (INDEPENDENT_AMBULATORY_CARE_PROVIDER_SITE_OTHER): Payer: Medicaid Other

## 2020-08-22 ENCOUNTER — Encounter: Payer: Self-pay | Admitting: Obstetrics and Gynecology

## 2020-08-22 ENCOUNTER — Ambulatory Visit: Payer: Self-pay | Admitting: Clinical

## 2020-08-22 VITALS — BP 111/59 | HR 79 | Wt 200.5 lb

## 2020-08-22 DIAGNOSIS — F411 Generalized anxiety disorder: Secondary | ICD-10-CM

## 2020-08-22 DIAGNOSIS — O98813 Other maternal infectious and parasitic diseases complicating pregnancy, third trimester: Secondary | ICD-10-CM

## 2020-08-22 DIAGNOSIS — O99320 Drug use complicating pregnancy, unspecified trimester: Secondary | ICD-10-CM

## 2020-08-22 DIAGNOSIS — Q270 Congenital absence and hypoplasia of umbilical artery: Secondary | ICD-10-CM

## 2020-08-22 DIAGNOSIS — O99013 Anemia complicating pregnancy, third trimester: Secondary | ICD-10-CM

## 2020-08-22 DIAGNOSIS — F1120 Opioid dependence, uncomplicated: Secondary | ICD-10-CM

## 2020-08-22 DIAGNOSIS — Z3400 Encounter for supervision of normal first pregnancy, unspecified trimester: Secondary | ICD-10-CM

## 2020-08-22 DIAGNOSIS — O403XX0 Polyhydramnios, third trimester, not applicable or unspecified: Secondary | ICD-10-CM

## 2020-08-22 DIAGNOSIS — Z7189 Other specified counseling: Secondary | ICD-10-CM

## 2020-08-22 DIAGNOSIS — O0933 Supervision of pregnancy with insufficient antenatal care, third trimester: Secondary | ICD-10-CM

## 2020-08-22 DIAGNOSIS — Z3A38 38 weeks gestation of pregnancy: Secondary | ICD-10-CM

## 2020-08-22 DIAGNOSIS — O3663X0 Maternal care for excessive fetal growth, third trimester, not applicable or unspecified: Secondary | ICD-10-CM

## 2020-08-22 DIAGNOSIS — O409XX0 Polyhydramnios, unspecified trimester, not applicable or unspecified: Secondary | ICD-10-CM | POA: Insufficient documentation

## 2020-08-22 DIAGNOSIS — F1910 Other psychoactive substance abuse, uncomplicated: Secondary | ICD-10-CM

## 2020-08-22 DIAGNOSIS — A749 Chlamydial infection, unspecified: Secondary | ICD-10-CM

## 2020-08-22 LAB — GLUCOSE, CAPILLARY: Glucose-Capillary: 101 mg/dL — ABNORMAL HIGH (ref 70–99)

## 2020-08-22 NOTE — BH Specialist Note (Signed)
Integrated Behavioral Health Initial In-Person Visit  MRN: [id number] Name: [name]  Number of Integrated Behavioral Health Clinician visits:: 1/6 Session Start time: 2:46  Session End time: 3:10 Total time:  24  minutes  Types of Service: Individual psychotherapy  Interpretor:No. Interpretor Name and Language: n/a   Warm Hand Off Completed.         Subjective: [name] is a 25 y.o. female accompanied by  n/a Patient was referred by Leroy Libman, MD for anxious about childbirth. Patient reports the following symptoms/concerns: Pt states her primary concern is feeling nervous about upcoming childbirth; helps to talk through emotions today.  Duration of problem: Increase last trimester pregnancy; Severity of problem: moderate  Objective: Mood: Anxious and Affect: Appropriate Risk of harm to self or others: No plan to harm self or others  Life Context: Family and Social: Pt feels well-supported by boyfriend, mother, family School/Work: - Self-Care: - Life Changes: Current first pregnancy  Patient and/or Family's Strengths/Protective Factors: Social connections, Concrete supports in place (healthy food, safe environments, etc.), and Sense of purpose  Goals Addressed: Patient will: Reduce symptoms of: anxiety, depression, and stress Increase knowledge and/or ability of: healthy habits and stress reduction  Demonstrate ability to: Increase healthy adjustment to current life circumstances and Increase adequate support systems for patient/family  Progress towards Goals: Ongoing  Interventions: Interventions utilized: Solution-Focused Strategies, Psychoeducation and/or Health Education, and Link to [employer]  Standardized Assessments completed: GAD-7 and PHQ 9  Patient and/or Family Response: Pt agrees with treatment plan  Patient Centered Plan: Patient is on the following Treatment Plan(s):  IBH  Assessment: Patient currently experiencing Other  specified counseling   Patient may benefit from psychoeducation and brief therapeutic interventions regarding coping with symptoms of anxiety, depression, stress .  Plan: Follow up with behavioral health clinician on : Pt agrees to f/u call two weeks postpartum; will schedule any follow up visits at that time. Call Tyress Loden at [phone] as needed.  Behavioral recommendations:  -Continue taking Subutex and Iron pills as prescribed by medical provider -Use relaxation and distraction strategies discussed, for the next three days, prior to induction -Consider reading through Postpartum Planner (on After Visit Summary) Referral(s): Integrated Art gallery manager (In Clinic) and [employer]:  New mom support groups  Rae Lips, [place]  Depression screen [facility] 2/9 08/22/2020 07/26/2020 07/11/2020 07/04/2020 06/05/2020  Decreased Interest 3 0 3 0 2  Down, Depressed, Hopeless 3 0 1 0 1  PHQ - 2 Score 6 0 4 0 3  Altered sleeping 3 - 3 0 2  Tired, decreased energy 3 - 3 0 3  Change in appetite 3 - 3 0 3  Feeling bad or failure about yourself  0 - 0 0 0  Trouble concentrating 0 - 0 0 0  Moving slowly or fidgety/restless 0 - 0 0 2  Suicidal thoughts 0 - 0 0 0  PHQ-9 Score 15 - 13 0 13  Difficult doing work/chores - - - Not difficult at all -  Some recent data might be hidden   GAD 7 : Generalized Anxiety Score 08/22/2020 07/11/2020 06/05/2020 03/13/2020  Nervous, Anxious, on Edge 1 1 2  0  Control/stop worrying 1 1 1  0  Worry too much - different things 1 1 1  0  Trouble relaxing 2 1 3  0  Restless 3 1 3  0  Easily annoyed or irritable 3 1 3  0  Afraid - awful might happen 0 1 1 0  Total GAD  7 Score 11 7 14  0

## 2020-08-22 NOTE — Progress Notes (Addendum)
Induction has been scheduled for August 25, 2020 during the day. Patient will be 39 weeks at time of induction.   [name], CMA   08/22/20

## 2020-08-22 NOTE — Progress Notes (Signed)
   PRENATAL VISIT NOTE  Subjective:  [name] is a 25 y.o. G2P0010 at [redacted]w[redacted]d being seen today for ongoing prenatal care.  She is currently monitored for the following issues for this high-risk pregnancy and has GAD (generalized anxiety disorder); MDD (major depressive disorder), recurrent severe, without psychosis (HCC); Opioid use disorder, severe, in early remission (HCC); Supervision of normal first pregnancy, antepartum; Substance abuse affecting pregnancy, antepartum; Chlamydia infection affecting pregnancy; Tobacco use disorder; Single umbilical artery; Anemia affecting pregnancy in third trimester; and Polyhydramnios on their problem list.  Patient reports no complaints.  Contractions: Irregular. Vag. Bleeding: None.  Movement: Present. Denies leaking of fluid.   The following portions of the patient's history were reviewed and updated as appropriate: allergies, current medications, past family history, past medical history, past social history, past surgical history and problem list.   Objective:   Vitals:   08/22/20 1321  BP: (!) 111/59  Pulse: 79  Weight: 200 lb 8 oz (90.9 kg)    Fetal Status: Fetal Heart Rate (bpm): RNST   Movement: Present  Presentation: Vertex  General:  Alert, oriented and cooperative. Patient is in no acute distress.  Skin: Skin is warm and dry. No rash noted.   Cardiovascular: Normal heart rate noted  Respiratory: Normal respiratory effort, no problems with respiration noted  Abdomen: Soft, gravid, appropriate for gestational age.  Pain/Pressure: Present     Pelvic: Cervical exam deferred        Extremities: Normal range of motion.     Mental Status: Normal mood and affect. Normal behavior. Normal judgment and thought content.   Assessment and Plan:  Pregnancy: G2P0010 at [redacted]w[redacted]d  1. Supervision of normal first pregnancy, antepartum - Patient with limited prenatal care (has been seen by IM for subutex but has had limited OB appointments, today has  mild polyhydramnios - random CBG today is 101 - given limited prenatal care, mild poly, lack of gDM screening, recommend induction to be planned for 39 weeks - IOL scheduled for 39 weeks - patient left prior to GBS/GC/CT being collected  2. Pregnancy complicated by subutex maintenance, antepartum (HCC) - US FETAL BPP W/NONSTRESS; Future  3. Single umbilical artery  4. Substance abuse affecting pregnancy, antepartum - on subutex 8 mg TID rx by IM  5. Chlamydia infection affecting pregnancy in third trimester Neg TOC  6. Anemia affecting pregnancy in third trimester  7. [redacted] weeks gestation of pregnancy  8. Limited prenatal care in third trimester - Pt has had limited prenatal care, has been followed by IM for subutex but has only had 3 prenatal visits - states she has had issues with transportation  9. Polyhydramnios in third trimester complication, single or unspecified fetus  10. Excessive fetal growth affecting management of pregnancy in third trimester, single or unspecified fetus  11. GAD (generalized anxiety disorder) Elevated PHQ9, patient states it is due to being irritable from being "so big" and she sees Asher Muir, will try to schedule appt with Asher Muir   Term labor symptoms and general obstetric precautions including but not limited to vaginal bleeding, contractions, leaking of fluid and fetal movement were reviewed in detail with the patient. Please refer to After Visit Summary for other counseling recommendations.   Return in about 5 weeks (around 09/26/2020) for high OB, in person.  Future Appointments  Date Time Provider Department Center  10/02/2020  2:15 PM Currie Paris, NP [facility]    Conan Bowens, MD

## 2020-08-22 NOTE — Progress Notes (Signed)

## 2020-08-22 NOTE — Patient Instructions (Signed)
[facility] [place], [place] [id number] [phone] (main office) [phone] [facility] office)  [email or web address]  Www.[email or web address].net     BRAINSTORMING  Develop a Plan Goals: Provide a way to start conversation about your new life with a baby Assist parents in recognizing and using resources within their reach Help pave the way before birth for an easier period of transition afterwards.  Make a list of the following information to keep in a central location: Full name of Mom and Partner: _____________________________________________ 76 full name and Date of Birth: ___________________________________________ Home Address: ___________________________________________________________ ________________________________________________________________________ Home Phone: ____________________________________________________________ Parents' cell numbers: _____________________________________________________ ________________________________________________________________________ Name and contact info for OB: ______________________________________________ Name and contact info for Pediatrician:________________________________________ Contact info for Lactation Consultants: ________________________________________  REST and SLEEP *You each need at least 4-5 hours of uninterrupted sleep every day. Write specific names and contact information.* How are you going to rest in the postpartum period? While partner's home? When partner returns to work? When you both return to work? Where will your baby sleep? Who is available to help during the day? Evening? Night? Who could move in for a period to help support you? What are some ideas to help you get enough  sleep? __________________________________________________________________________________________________________________________________________________________________________________________________________________________________________ NUTRITIOUS FOOD AND DRINK *Plan for meals before your baby is born so you can have healthy food to eat during the immediate postpartum period.* Who will look after breakfast? Lunch? Dinner? List names and contact information. Brainstorm quick, healthy ideas for each meal. What can you do before baby is born to prepare meals for the postpartum period? How can others help you with meals? Which grocery stores provide online shopping and delivery? Which restaurants offer take-out or delivery options? ______________________________________________________________________________________________________________________________________________________________________________________________________________________________________________________________________________________________________________________________________________________________________________________________________  CARE FOR MOM *It's important that mom is cared for and pampered in the postpartum period. Remember, the most important ways new mothers need care are: sleep, nutrition, gentle exercise, and time off.* Who can come take care of mom during this period? Make a list of people with their contact information. List some activities that make you feel cared for, rested, and energized? Who can make sure you have opportunities to do these things? Does mom have a space of her very own within your home that's just for her? Make a "[facility]" where she can be comfortable, rest, and renew herself  daily. ______________________________________________________________________________________________________________________________________________________________________________________________________________________________________________________________________________________________________________________________________________________________________________________________________    CARE FOR AND FEEDING BABY *Knowledgeable and encouraging people will offer the best support with regard to feeding your baby.* Educate yourself and choose the best feeding option for your baby. Make a list of people who will guide, support, and be a resource for you as your care for and feed your baby. (Friends that have breastfed or are currently breastfeeding, lactation consultants, breastfeeding support groups, etc.) Consider a postpartum doula. (These websites can give you information: dona.org & BuyingShow.es) Seek out local breastfeeding resources like the breastfeeding support group at [employer] or [employer]. ______________________________________________________________________________________________________________________________________________________________________________________________________________________________________________________________________________________________________________________________________________________________________________________________________  Verner Chol AND ERRANDS Who can help with a thorough cleaning before baby is born? Make a list of people who will help with housekeeping and chores, like laundry, light cleaning, dishes, bathrooms, etc. Who can run some errands for you? What can you do to make sure you are stocked with basic supplies before baby is born? Who is going to do the  shopping? ______________________________________________________________________________________________________________________________________________________________________________________________________________________________________________________________________________________________________________________________________________________________________________________________________     Family Adjustment *Nurture yourselves.it helps parents be more loving and allows for better bonding with their child.* What sorts of things do  you and partner enjoy doing together? Which activities help you to connect and strengthen your relationship? Make a list of those things. Make a list of people whom you trust to care for your baby so you can have some time together as a couple. What types of things help partner feel connected to Mom? Make a list. What needs will partner have in order to bond with baby? Other children? Who will care for them when you go into labor and while you are in the hospital? Think about what the needs of your older children might be. Who can help you meet those needs? In what ways are you helping them prepare for bringing baby home? List some specific strategies you have for family adjustment. _______________________________________________________________________________________________________________________________________________________________________________________________________________________________________________________________________________________________________________________________________________  SUPPORT *Someone who can empathize with experiences normalizes your problems and makes them more bearable.* Make a list of other friends, neighbors, and/or co-workers you know with infants (and small children, if applicable) with whom you can connect. Make a list of local or online support groups, mom groups, etc. in which you can be  involved. ______________________________________________________________________________________________________________________________________________________________________________________________________________________________________________________________________________________________________________________________________________________________________________________________________  Childcare Plans Investigate and plan for childcare if mom is returning to work. Talk about mom's concerns about her transition back to work. Talk about partner's concerns regarding this transition.  Mental Health *Your mental health is one of the highest priorities for a pregnant or postpartum mom.* 1 in 5 women experience anxiety and/or depression from the time of conception through the first year after birth. Postpartum Mood Disorders are the #1 complication of pregnancy and childbirth and the suffering experienced by these mothers is not necessary! These illnesses are temporary and respond well to treatment, which often includes self-care, social support, talk therapy, and medication when needed. Women experiencing anxiety and depression often say things like: "I'm supposed to be happy.why do I feel so sad?", "Why can't I snap out of it?", "I'm having thoughts that scare me." There is no need to be embarrassed if you are feeling these symptoms: Overwhelmed, anxious, angry, sad, guilty, irritable, hopeless, exhausted but can't sleep You are NOT alone. You are NOT to blame. With help, you WILL be well. Where can I find help? Medical professionals such as your OB, midwife, gynecologist, family practitioner, primary care provider, pediatrician, or mental health providers; [facility] support groups: Feelings After Birth, Breastfeeding Support Group, Baby and Me Group, and Fit 4 Two exercise classes. You have permission to ask for help. It will confirm your feelings, validate your experiences,  share/learn coping strategies, and gain support and encouragement as you heal. You are important! BRAINSTORM Make a list of local resources, including resources for mom and for partner. Identify support groups. Identify people to call late at night - include names and contact info. Talk with partner about perinatal mood and anxiety disorders. Talk with your OB, midwife, and doula about baby blues and about perinatal mood and anxiety disorders. Talk with your pediatrician about perinatal mood and anxiety disorders.   Support & Sanity Savers   What do you really need?  Basics In preparing for a new baby, many expectant parents spend hours shopping for baby clothes, decorating the nursery, and deciding which car seat to buy. Yet most don't think much about what the reality of parenting a newborn will be like, and what they need to make it through that. So, here is the advice of experienced parents. We know you'll read this, and think "they're exaggerating, I don't really need that." Just trust us on these, OK?   Plan for all of this, and if it turns out you don't need it, come back and teach us how you did it!  Must-Haves (Once baby's survival needs are met, make sure you attend to your own survival needs!) Sleep An average newborn sleeps 16-18 hours per day, over 6-7 sleep periods, rarely more than three hours at a time. It is normal and healthy for a newborn to wake throughout the night... but really hard on parents!! Naps. Prioritize sleep above any responsibilities like: cleaning house, visiting friends, running errands, etc.  Sleep whenever baby sleeps. If you can't nap, at least have restful times when baby eats. The more rest you get, the more patient you will be, the more emotionally stable, and better at solving problems.  Food You may not have realized it would be difficult to eat when you have a newborn. Yet, when we talk to countless new parents, they say things like "it may be 2:00 pm  when I realize I haven't had breakfast yet." Or "every time we sit down to dinner, baby needs to eat, and my food gets cold, so I don't bother to eat it." Finger food. Before your baby is born, stock up with one months' worth of food that: 1) you can eat with one hand while holding a baby, 2) doesn't need to be prepped, 3) is good hot or cold, 4) doesn't spoil when left out for a few hours, and 5) you like to eat. Think about: nuts, dried fruit, Clif bars, pretzels, jerky, gogurt, baby carrots, apples, bananas, crackers, cheez-n-crackers, string cheese, hot pockets or frozen burritos to microwave, garden burgers and breakfast pastries to put in the toaster, yogurt drinks, etc. Restaurant Menus. Make lists of your favorite restaurants & menu items. When family/friends want to help, you can give specific information without much thought. They can either bring you the food or send gift cards for just the right meals. Freezer Meals.  Take some time to make a few meals to put in the freezer ahead of time.  Easy to freeze meals can be anything such as soup, lasagna, chicken pie, or spaghetti sauce. Set up a Meal Schedule.  Ask friends and family to sign up to bring you meals during the first few weeks of being home. (It can be passed around at baby showers!) You have no idea how helpful this will be until you are in the throes of parenting.  www.takethemameal.com is a great website to check out. Emotional Support Know who to call when you're stressed out. Parenting a newborn is very challenging work. There are times when it totally overwhelms your normal coping abilities. EVERY NEW PARENT NEEDS TO HAVE A PLAN FOR WHO TO CALL WHEN THEY JUST CAN'T COPE ANY MORE. (And it has to be someone other than the baby's other parent!) Before your baby is born, come up with at least one person you can call for support - write their phone number down and post it on the refrigerator. Anxiety & Sadness. Baby blues are normal after  pregnancy; however, there are more severe types of anxiety & sadness which can occur and should not be ignored.  They are always treatable, but you have to take the first step by reaching out for help. [facility] offers a "Mom Talk" group which meets every Tuesday from 10 am - 11 am.  This group is for new moms who need support and connection after their babies are born.  Call [phone].  Really, Really   Helpful (Plan for them! Make sure these happen often!!) Physical Support with Taking Care of Yourselves Asking friends and family. Before your baby is born, set up a schedule of people who can come and visit and help out (or ask a friend to schedule for you). Any time someone says "let me know what I can do to help," sign them up for a day. When they get there, their job is not to take care of the baby (that's your job and your joy). Their job is to take care of you!  Postpartum doulas. If you don't have anyone you can call on for support, look into postpartum doulas:  professionals at helping parents with caring for baby, caring for themselves, getting breastfeeding started, and helping with household tasks. www.padanc.org is a helpful website for learning about doulas in our area. Peer Support / Parent Groups Why: One of the greatest ideas for new parents is to be around other new parents. Parent groups give you a chance to share and listen to others who are going through the same season of life, get a sense of what is normal infant development by watching several babies learn and grow, share your stories of triumph and struggles with empathetic ears, and forgive your own mistakes when you realize all parents are learning by trial and error. Where to find: There are many places you can meet other new parents throughout our community.  [facility] offers the following classes for new moms and their little ones:  Baby and Me (Birth to [place]) and Breastfeeding Support Group. Go to  www.conehealthybaby.com or call [phone] for more information. Time for your Relationship It's easy to get so caught up in meeting baby's immediate needs that it's hard to find time to connect with your partner, and meet the needs of your relationship. It's also easy to forget what "quality time with your partner" actually looks like. If you take your baby on a date, you'd be amazed how much of your couple time is spent feeding the baby, diapering the baby, admiring the baby, and talking about the baby. Dating: Try to take time for just the two of you. Babysitter tip: Sometimes when moms are breastfeeding a newborn, they find it hard to figure out how to schedule outings around baby's unpredictable feeding schedules. Have the babysitter come for a three hour period. When she comes over, if baby has just eaten, you can leave right away, and come back in two hours. If baby hasn't fed recently, you start the date at home. Once baby gets hungry and gets a good feeding in, you can head out for the rest of your date time. Date Nights at Home: If you can't get out, at least set aside one evening a week to prioritize your relationship: whenever baby dozes off or doesn't have any immediate needs, spend a little time focusing on each other. Potential conflicts: The main relationship conflicts that come up for new parents are: issues related to sexuality, financial stresses, a feeling of an unfair division of household tasks, and conflicts in parenting styles. The more you can work on these issues before baby arrives, the better!  Fun and Frills (Don't forget these. and don't feel guilty for indulging in them!) Everyone has something in life that is a fun little treat that they do just for themselves. It may be: reading the morning paper, or going for a daily jog, or having coffee with a friend once a week, or going to a  movie on Friday nights, or fine chocolates, or bubble baths, or curling up with a good  book. Unless you do fun things for yourself every now and then, it's hard to have the energy for fun with your baby. Whatever your "special" treats are, make sure you find a way to continue to indulge in them after your baby is born. These special moments can recharge you, and allow you to return to baby with a new joy   PERINATAL MOOD DISORDERS: [place]   Emergency and Crisis Resources:  If you are an imminent risk to self or others, are experiencing intense personal distress, and/or have noticed significant changes in activities of daily living, call:  [facility]: [place]: [place]: [phone] Or visit the following crisis centers: Local Emergency Departments [facility]: [address] [place]. Hours: 8:30AM-5PM. Insurance Accepted: Medicaid, Medicare, and Uninsured.  [facility]  [address] [place] Mon-Friday 8am-3pm  [phone]                                                                                    Non-Crisis Resources: To identify specific providers that are covered by your insurance, contact your insurance company or local agencies: [place]: [phone] [facility] and [place]: [phone] [place]: [phone] Postpartum Support [facility]- [place] [phone]                                                      Outpatient therapy and medication management providers:  [facility] [phone] Hours: 9AM-5PM  Insurance Accepted: Alben Spittle, Lorella Nimrod, Freddrick March, [place], Medicare  [facility] ([place]) [phone] Hours: 8AM-5PM  nsurance Accepted: All insurances EXCEPT AARP, [place], [place], and [place]: [phone]             Hours: 8AM-8PM Insurance  Accepted: Cristal Ford, Freddrick March, [place], Medicare, [place] [phone] [facility]: [phone] Hours: 8:30AM-7PM Insurance Accepted: Cristal Ford, Medicaid, Medicare, Tricare, [employer] Counseling:  [place] Accepted:  Holland Falling, Lorella Nimrod, [facility], [place], [facility] [phone] Hours: 9AM-5:30PM Insurance Accepted: Alben Spittle, Charlotte Crumb, and Medicaid, Medicare, [employer] [facility] Counsel[facility]:  [phone] Hours: 9am-5pm Insurance Accepted: BCBS; they do not accept Medicaid/Medicare [place]: [phone] Hours: 9am-9pm Insurance Accepted: All major insurance including Medicaid and Medicare [facility]: [phone] Hours: 9AM-5:30PM Insurance Accepted: All insurances EXCEPT Medicaid and Medicare. [facility]: [phone]  Parenting [place]: [phone] [facility]:  [place] (support for children in the NICU and/or with special needs), [place] [employer]: [phone]                                                                                     Online Resources: Postpartum Support International: http://jones-berg.com/  800-944-4PPD 2Moms Supporting Moms:  www.momssupportingmoms.net

## 2020-08-23 ENCOUNTER — Telehealth (HOSPITAL_COMMUNITY): Payer: Self-pay | Admitting: *Deleted

## 2020-08-23 ENCOUNTER — Other Ambulatory Visit (HOSPITAL_COMMUNITY): Payer: Self-pay

## 2020-08-23 NOTE — Telephone Encounter (Signed)
Preadmission screen  

## 2020-08-24 ENCOUNTER — Telehealth (HOSPITAL_COMMUNITY): Payer: Self-pay | Admitting: *Deleted

## 2020-08-24 ENCOUNTER — Other Ambulatory Visit (HOSPITAL_COMMUNITY): Payer: Medicaid Other | Attending: Obstetrics and Gynecology

## 2020-08-24 ENCOUNTER — Other Ambulatory Visit (HOSPITAL_COMMUNITY): Payer: Self-pay

## 2020-08-24 NOTE — Telephone Encounter (Signed)
Preadmission screen  

## 2020-08-25 ENCOUNTER — Inpatient Hospital Stay (HOSPITAL_COMMUNITY): Payer: Medicaid Other

## 2020-08-26 ENCOUNTER — Inpatient Hospital Stay (HOSPITAL_COMMUNITY)
Admission: AD | Admit: 2020-08-26 | Discharge: 2020-08-30 | DRG: 806 | Disposition: A | Discharge status: Home / Self Care | Payer: Medicaid Other | Attending: Obstetrics & Gynecology | Admitting: Obstetrics & Gynecology

## 2020-08-26 ENCOUNTER — Encounter (HOSPITAL_COMMUNITY): Payer: Self-pay | Admitting: Obstetrics and Gynecology

## 2020-08-26 DIAGNOSIS — O99344 Other mental disorders complicating childbirth: Secondary | ICD-10-CM | POA: Diagnosis present

## 2020-08-26 DIAGNOSIS — O99334 Smoking (tobacco) complicating childbirth: Secondary | ICD-10-CM | POA: Diagnosis present

## 2020-08-26 DIAGNOSIS — Z20822 Contact with and (suspected) exposure to covid-19: Secondary | ICD-10-CM | POA: Diagnosis present

## 2020-08-26 DIAGNOSIS — O24429 Gestational diabetes mellitus in childbirth, unspecified control: Secondary | ICD-10-CM | POA: Diagnosis present

## 2020-08-26 DIAGNOSIS — A749 Chlamydial infection, unspecified: Secondary | ICD-10-CM

## 2020-08-26 DIAGNOSIS — O9902 Anemia complicating childbirth: Secondary | ICD-10-CM | POA: Diagnosis present

## 2020-08-26 DIAGNOSIS — F17210 Nicotine dependence, cigarettes, uncomplicated: Secondary | ICD-10-CM | POA: Diagnosis present

## 2020-08-26 DIAGNOSIS — Z3A39 39 weeks gestation of pregnancy: Secondary | ICD-10-CM

## 2020-08-26 DIAGNOSIS — F332 Major depressive disorder, recurrent severe without psychotic features: Secondary | ICD-10-CM | POA: Diagnosis present

## 2020-08-26 DIAGNOSIS — O99320 Drug use complicating pregnancy, unspecified trimester: Secondary | ICD-10-CM | POA: Diagnosis present

## 2020-08-26 DIAGNOSIS — D563 Thalassemia minor: Secondary | ICD-10-CM | POA: Diagnosis present

## 2020-08-26 DIAGNOSIS — F32A Depression, unspecified: Secondary | ICD-10-CM | POA: Diagnosis present

## 2020-08-26 DIAGNOSIS — F1121 Opioid dependence, in remission: Secondary | ICD-10-CM | POA: Diagnosis present

## 2020-08-26 DIAGNOSIS — O99013 Anemia complicating pregnancy, third trimester: Secondary | ICD-10-CM

## 2020-08-26 DIAGNOSIS — O98813 Other maternal infectious and parasitic diseases complicating pregnancy, third trimester: Secondary | ICD-10-CM

## 2020-08-26 DIAGNOSIS — O409XX0 Polyhydramnios, unspecified trimester, not applicable or unspecified: Secondary | ICD-10-CM | POA: Diagnosis present

## 2020-08-26 DIAGNOSIS — Q270 Congenital absence and hypoplasia of umbilical artery: Secondary | ICD-10-CM

## 2020-08-26 DIAGNOSIS — Z79899 Other long term (current) drug therapy: Secondary | ICD-10-CM | POA: Diagnosis not present

## 2020-08-26 DIAGNOSIS — F411 Generalized anxiety disorder: Secondary | ICD-10-CM | POA: Diagnosis present

## 2020-08-26 DIAGNOSIS — O99324 Drug use complicating childbirth: Secondary | ICD-10-CM | POA: Diagnosis present

## 2020-08-26 DIAGNOSIS — O403XX0 Polyhydramnios, third trimester, not applicable or unspecified: Secondary | ICD-10-CM | POA: Diagnosis present

## 2020-08-26 DIAGNOSIS — Z3400 Encounter for supervision of normal first pregnancy, unspecified trimester: Secondary | ICD-10-CM

## 2020-08-26 MED ORDER — OXYTOCIN BOLUS FROM INFUSION
333.0000 mL | Freq: Once | INTRAVENOUS | Status: AC
  Administered 2020-08-28: 333 mL via INTRAVENOUS

## 2020-08-26 MED ORDER — ACETAMINOPHEN 325 MG PO TABS
650.0000 mg | ORAL_TABLET | ORAL | Status: DC | PRN
  Administered 2020-08-28: 650 mg via ORAL
  Filled 2020-08-26: qty 2

## 2020-08-26 MED ORDER — LACTATED RINGERS IV SOLN: INTRAVENOUS | Status: DC

## 2020-08-26 MED ORDER — ONDANSETRON HCL 4 MG/2ML IJ SOLN
4.0000 mg | Freq: Four times a day (QID) | INTRAMUSCULAR | Status: DC | PRN
  Administered 2020-08-27: 4 mg via INTRAVENOUS
  Filled 2020-08-26: qty 2

## 2020-08-26 MED ORDER — LACTATED RINGERS IV SOLN
500.0000 mL | INTRAVENOUS | Status: DC | PRN
  Administered 2020-08-27: 1000 mL via INTRAVENOUS

## 2020-08-26 MED ORDER — OXYTOCIN-SODIUM CHLORIDE 30-0.9 UT/500ML-% IV SOLN
2.5000 [IU]/h | INTRAVENOUS | Status: DC
  Administered 2020-08-28: 2.5 [IU]/h via INTRAVENOUS
  Filled 2020-08-26: qty 500

## 2020-08-26 MED ORDER — TERBUTALINE SULFATE 1 MG/ML IJ SOLN: 0.2500 mg | Freq: Once | INTRAMUSCULAR | Status: DC | PRN

## 2020-08-26 MED ORDER — SOD CITRATE-CITRIC ACID 500-334 MG/5ML PO SOLN: 30.0000 mL | ORAL | Status: DC | PRN

## 2020-08-26 MED ORDER — LIDOCAINE HCL (PF) 1 % IJ SOLN: 30.0000 mL | INTRAMUSCULAR | Status: DC | PRN

## 2020-08-26 MED ORDER — MISOPROSTOL 25 MCG QUARTER TABLET: 25.0000 ug | ORAL_TABLET | ORAL | Status: DC | PRN

## 2020-08-26 NOTE — H&P (Addendum)
OBSTETRIC ADMISSION HISTORY AND PHYSICAL  [name] is a 25 y.o. female G2P0010 with IUP at 79w1dby LMP presenting for IOL- mild polyhydramnios (AFI 25). She reports +FMs-she reports decreased FM over the last couple of days, No LOF, no VB, no blurry vision, headaches or peripheral edema, and RUQ pain.  She plans on breast feeding. She request POPs for birth control but is open to an IUD. [facility] --->  Estimated Date of Delivery: 09/01/20  Sono:   08/13/20'@[redacted]w[redacted]d' , CWD, normal anatomy, cephalic presentation, posterior placental lie,  Prenatal History/Complications:  Anxiety/Depression (gabapentin) Substance Use Disorder (subutex) SUA Alpha thal carrier  Anemia   Past Medical History: Past Medical History:  Diagnosis Date   ADHD (attention deficit hyperactivity disorder)    Anesthesia complication    woke up fighting after tonsillectomy   Anxiety    Cholecystitis    Complication of anesthesia    when wakes up " freaks out- like panic attack"   Depression    Dysmenorrhea    Endometriosis    GERD (gastroesophageal reflux disease)    Headache    migraines   Hyperlipidemia    Hypoglycemia    Polysubstance abuse ([place]    Pregnancy complicated by subutex maintenance, [place]    Syncope    Viral warts    hand   Vision abnormalities    wears glasses    Past Surgical History: Past Surgical History:  Procedure Laterality Date   ADENOIDECTOMY     CHOLECYSTECTOMY  03/21/2011   Procedure: LAPAROSCOPIC CHOLECYSTECTOMY;  Surgeon: DHarl Bowie MD;  [facility] [facility]  Service: General;  Laterality: N/A;   ESOPHAGOGASTRODUODENOSCOPY  09/26/2011   Procedure: ESOPHAGOGASTRODUODENOSCOPY (EGD);  Surgeon: JOletha Blend MD;  [facility] [facility]  Service: Gastroenterology;  Laterality: N/A;   TONSILLECTOMY AND ADENOIDECTOMY  06/2005   WISDOM TOOTH EXTRACTION      Obstetrical History: OB History     Gravida  2   Para  0    Term  0   Preterm  0   AB  1   Living  0      SAB  0   IAB  1   Ectopic  0   Multiple  0   Live Births  0           Social History Social History   Socioeconomic History   Marital status: Single    Spouse name: Not on file   Number of children: Not on file   Years of education: Not on file   Highest education level: Not on file  Occupational History   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  Tobacco Use   Smoking status: Every Day    Packs/day: 1.00    Years: 4.00    Pack years: 4.00    Types: Cigarettes   Smokeless tobacco: Never   Tobacco comments:    Quit in June  Vaping Use   Vaping Use: Never used  Substance and Sexual Activity   Alcohol use: Not Currently    Comment: socially   Drug use: Not Currently    Types: Marijuana, Heroin, Benzodiazepines    Comment: last used 11/2019    Sexual activity: Yes    Birth control/protection: None  Other Topics Concern   Not on file  Social History Narrative   Repeating 9th grade (missed 40 days last year despite homebound instruction)  Social Determinants of Radio broadcast assistant Strain: Not on file  Food Insecurity: No Food Insecurity   Worried About Charity fundraiser in the Last Year: Never true   Arboriculturist in the Last Year: Never true  Transportation Needs: Unmet Transportation Needs   Lack of Transportation (Medical): Yes   Lack of Transportation (Non-Medical): Yes  Physical Activity: Not on file  Stress: Not on file  Social Connections: Not on file    Family History: Family History  Problem Relation Age of Onset   Anesthesia problems Maternal Grandfather    Heart disease Maternal Grandfather    Nephrolithiasis Maternal Grandfather    Diabetes Maternal Grandfather    Mental illness Maternal Grandfather    Cholelithiasis Mother    Nephrolithiasis Mother    Depression Mother    Hypertension Mother    Miscarriages / Stillbirths Mother    Anxiety disorder  Mother    Gout Father    Nephrolithiasis Maternal Grandmother    COPD Maternal Grandmother    Heart disease Paternal Grandfather    Cholelithiasis Maternal Aunt    Depression Maternal Aunt    Learning disabilities Maternal Aunt    Bipolar disorder Sister     Allergies: Allergies  Allergen Reactions   Blueberry Fruit Extract Anaphylaxis   Contrast Media [Iodinated Diagnostic Agents] Anaphylaxis and Rash   Codeine Hives, Itching and Nausea And Vomiting   Omnipaque [Iohexol] Itching, Nausea And Vomiting and Swelling    Medications Prior to Admission  Medication Sig Dispense Refill Last Dose   buprenorphine (SUBUTEX) 8 MG SUBL SL tablet Place 1 tablet (8 mg total) under the tongue in the morning, at noon, and at bedtime. 90 tablet 0 08/26/2020   gabapentin (NEURONTIN) 300 MG capsule Take 1 capsule (300 mg total) by mouth 3 (three) times daily. 90 capsule 2 08/26/2020   polyethylene glycol (MIRALAX) 17 g packet Take 17 g by mouth daily. 14 each 0 Past Week   Prenat-Fe Poly-Methfol-FA-DHA (VITAFOL ULTRA) 29-0.6-0.4-200 MG CAPS Take 1 tablet by mouth daily. 30 capsule 12 08/26/2020   promethazine (PHENERGAN) 25 MG tablet TAKE 1 TABLET BY MOUTH TWICE DAILY AS NEEDED FOR NAUSEA. IF NOT TOLERATED BY MOUTH, YOU CAN PLACE THEM IN THE VAGINA OR RECTUM INSTEAD. 28 tablet 1 08/26/2020   Blood Pressure Monitoring (BLOOD PRESSURE KIT) DEVI 1 Device by Does not apply route as needed. (Patient not taking: No sig reported) 1 each 0    cyclobenzaprine (FLEXERIL) 10 MG tablet Take 1 tablet (10 mg total) by mouth 2 (two) times daily as needed for muscle spasms. (Patient not taking: No sig reported) 20 tablet 0 More than a month   ferrous sulfate (FERROUSUL) 325 (65 FE) MG tablet Take 1 tablet (325 mg total) by mouth every other day. 60 tablet 1 More than a month   omeprazole (PRILOSEC) 20 MG capsule Take 1 capsule (20 mg total) by mouth daily. 30 capsule 5 More than a month     Review of Systems   All  systems reviewed and negative except as stated in HPI  Last menstrual period 11/26/2019. General appearance: alert, cooperative, and appears stated age Lungs: Normal WOB Abdomen: soft, non-tender; gravid Pelvic: As stated below Extremities: Homans sign is negative, no sign of DVT Presentation: cephalic Fetal monitoringBaseline: 130 bpm, Variability: Good {> 6 bpm), Accelerations: Reactive, and Decelerations: Absent Uterine activity: No contractions Dilation: 1 Effacement (%): Thick Exam by:: Maye Hides, CNM  Prenatal labs: ABO, Rh: --/--/PENDING (  07/31 2331) Antibody: PENDING (07/31 2331) Rubella: 9.00 (01/18 1449) RPR: Non Reactive (06/15 0846)  HBsAg: Negative (01/18 1449)  HIV: Non Reactive (06/15 0846)  GBS:   Unknown No glucose test performed  Genetic screening  low risk female/silent alpha thal carrier Anatomy US nml  Prenatal Transfer Tool  Maternal Diabetes: No 2 hour glucose test  Genetic Screening: Normal Maternal Ultrasounds/Referrals: Other: SUA, polyhydramnios (AFI 25) Fetal Ultrasounds or other Referrals:  Referred to Materal Fetal Medicine  Maternal Substance Abuse:  Yes:  Type: Smoker, Other: Subutex Significant Maternal Medications:  Meds include: Other:  Subutex and Gabapentin  Significant Maternal Lab Results: None  Results for orders placed or performed during the hospital encounter of 08/26/20 (from the past 24 hour(s))  Type and screen   Collection Time: 08/26/20 11:31 PM  Result Value Ref Range   ABO/RH(D) PENDING    Antibody Screen PENDING    Sample Expiration      08/29/2020,2359 Performed at [facility], [place] [address] [place], [place] [address]   CBC   Collection Time: 08/26/20 11:45 PM  Result Value Ref Range   WBC 8.7 4.0 - 10.5 K/uL   RBC 3.22 (L) 3.87 - 5.11 MIL/uL   Hemoglobin 8.8 (L) 12.0 - 15.0 g/dL   HCT 27.5 (L) 36.0 - 46.0 %   MCV 85.4 80.0 - 100.0 fL   MCH 27.3 26.0 - 34.0 pg   MCHC 32.0 30.0 - 36.0 g/dL   RDW  13.6 11.5 - 15.5 %   Platelets 203 150 - 400 K/uL   nRBC 0.0 0.0 - 0.2 %  Glucose, capillary   Collection Time: 08/27/20 12:16 AM  Result Value Ref Range   Glucose-Capillary 95 70 - 99 mg/dL    Patient Active Problem List   Diagnosis Date Noted   Polyhydramnios affecting pregnancy 08/26/2020   Polyhydramnios 08/22/2020   Anemia affecting pregnancy in third trimester 28/36/6294   Single umbilical artery 76/54/6503   Tobacco use disorder 05/29/2020   Chlamydia infection affecting pregnancy 02/16/2020   Supervision of normal first pregnancy, antepartum 02/14/2020   Substance abuse affecting pregnancy, antepartum 02/14/2020   Opioid use disorder, severe, in early remission ([place]) 01/10/2020   MDD (major depressive disorder), recurrent severe, without psychosis ([place]) 04/10/2017   GAD (generalized anxiety disorder) 03/15/2012    Assessment/Plan:  QUINNLYN HEARNS is a 25 y.o. G2P0010 at 30w1dhere for IOL- mild polyhydramnios (AFI 25).  #IOL: Pt is very posterior and 1cm. Will continue with a dose of cytotec, and on recheck may attempt a FB.  #Pain: PRN #FWB: Cat 1 #ID:  GBS unknown>ordered PCR #MOF: breast #MOC: POPs-open to IUDpp-continue conversation #Circ:  No #Anxiety/Depression: Gabapentin 3024mTID-ordered home dose #Substance use disorder: Subutex 18m20mID-no withdrawal symptoms, UDS ordered, home dose ordered #Possible GDM: Last CBG on 7/27 101, no 2 hour glucose test was performed. Will continue with HA1C, q4CBG. Cannot do fasting as pt ate 1 hour prior to arrival.  #Anemia: Await admit Hgb #Alpha thal carrier #SUA  AllErskine EmeryD  08/27/2020, 12:25 AM  -Patient last US [place] fetus weight 3632g, 92% EFW> caution for possible LGA -GC/CT collected as well -anticipatory guidance given regarding steps to IOL, patient, mother and FOB had no questions.   KatMaye Hides

## 2020-08-26 NOTE — Telephone Encounter (Signed)
Per RN, pt has been spoken to and can come in for IOL in about an hour.

## 2020-08-27 ENCOUNTER — Inpatient Hospital Stay (HOSPITAL_COMMUNITY): Payer: Medicaid Other | Admitting: Anesthesiology

## 2020-08-27 ENCOUNTER — Other Ambulatory Visit: Payer: Self-pay

## 2020-08-27 LAB — CBC
HCT: 27.5 % — ABNORMAL LOW (ref 36.0–46.0)
Hemoglobin: 8.8 g/dL — ABNORMAL LOW (ref 12.0–15.0)
MCH: 27.3 pg (ref 26.0–34.0)
MCHC: 32 g/dL (ref 30.0–36.0)
MCV: 85.4 fL (ref 80.0–100.0)
Platelets: 203 10*3/uL (ref 150–400)
RBC: 3.22 MIL/uL — ABNORMAL LOW (ref 3.87–5.11)
RDW: 13.6 % (ref 11.5–15.5)
WBC: 8.7 10*3/uL (ref 4.0–10.5)
nRBC: 0 % (ref 0.0–0.2)

## 2020-08-27 LAB — TYPE AND SCREEN
ABO/RH(D): A POS
Antibody Screen: NEGATIVE

## 2020-08-27 LAB — RAPID URINE DRUG SCREEN, HOSP PERFORMED
Amphetamines: NOT DETECTED
Barbiturates: NOT DETECTED
Benzodiazepines: NOT DETECTED
Cocaine: NOT DETECTED
Opiates: NOT DETECTED
Tetrahydrocannabinol: NOT DETECTED

## 2020-08-27 LAB — GROUP B STREP BY PCR: Group B strep by PCR: NEGATIVE

## 2020-08-27 LAB — GC/CHLAMYDIA PROBE AMP (~~LOC~~) NOT AT ARMC
Chlamydia: NEGATIVE
Comment: NEGATIVE
Comment: NORMAL
Neisseria Gonorrhea: NEGATIVE

## 2020-08-27 LAB — GLUCOSE, CAPILLARY
Glucose-Capillary: 60 mg/dL — ABNORMAL LOW (ref 70–99)
Glucose-Capillary: 64 mg/dL — ABNORMAL LOW (ref 70–99)
Glucose-Capillary: 74 mg/dL (ref 70–99)
Glucose-Capillary: 87 mg/dL (ref 70–99)
Glucose-Capillary: 91 mg/dL (ref 70–99)
Glucose-Capillary: 91 mg/dL (ref 70–99)
Glucose-Capillary: 95 mg/dL (ref 70–99)

## 2020-08-27 LAB — RESP PANEL BY RT-PCR (FLU A&B, COVID) ARPGX2
Influenza A by PCR: NEGATIVE
Influenza B by PCR: NEGATIVE
SARS Coronavirus 2 by RT PCR: NEGATIVE

## 2020-08-27 LAB — RPR: RPR Ser Ql: NONREACTIVE

## 2020-08-27 LAB — HEMOGLOBIN A1C
Hgb A1c MFr Bld: 5.1 % (ref 4.8–5.6)
Mean Plasma Glucose: 99.67 mg/dL

## 2020-08-27 MED ORDER — BUPRENORPHINE HCL 8 MG SL SUBL
8.0000 mg | SUBLINGUAL_TABLET | Freq: Three times a day (TID) | SUBLINGUAL | Status: DC
  Administered 2020-08-27 (×2): 8 mg via SUBLINGUAL
  Filled 2020-08-27 (×2): qty 1

## 2020-08-27 MED ORDER — EPHEDRINE 5 MG/ML INJ: 10.0000 mg | INTRAVENOUS | Status: DC | PRN

## 2020-08-27 MED ORDER — BUPRENORPHINE HCL 8 MG SL SUBL
8.0000 mg | SUBLINGUAL_TABLET | Freq: Three times a day (TID) | SUBLINGUAL | Status: DC
Start: 2020-08-28 — End: 2020-08-27
  Administered 2020-08-27: 8 mg via SUBLINGUAL
  Filled 2020-08-27: qty 1

## 2020-08-27 MED ORDER — GABAPENTIN 300 MG PO CAPS
300.0000 mg | ORAL_CAPSULE | Freq: Three times a day (TID) | ORAL | Status: DC
  Administered 2020-08-27 – 2020-08-28 (×3): 300 mg via ORAL
  Filled 2020-08-27 (×3): qty 1

## 2020-08-27 MED ORDER — TERBUTALINE SULFATE 1 MG/ML IJ SOLN: 0.2500 mg | Freq: Once | INTRAMUSCULAR | Status: DC | PRN

## 2020-08-27 MED ORDER — DIPHENHYDRAMINE HCL 50 MG/ML IJ SOLN
12.5000 mg | INTRAMUSCULAR | Status: DC | PRN
Start: 2020-08-27 — End: 2020-08-28

## 2020-08-27 MED ORDER — GABAPENTIN 300 MG PO CAPS: 300.0000 mg | ORAL_CAPSULE | Freq: Three times a day (TID) | ORAL | Status: DC

## 2020-08-27 MED ORDER — OXYTOCIN-SODIUM CHLORIDE 30-0.9 UT/500ML-% IV SOLN
1.0000 m[IU]/min | INTRAVENOUS | Status: DC
  Administered 2020-08-27: 2 m[IU]/min via INTRAVENOUS

## 2020-08-27 MED ORDER — PHENYLEPHRINE 40 MCG/ML (10ML) SYRINGE FOR IV PUSH (FOR BLOOD PRESSURE SUPPORT)
80.0000 ug | PREFILLED_SYRINGE | INTRAVENOUS | Status: DC | PRN
  Filled 2020-08-27: qty 10

## 2020-08-27 MED ORDER — SODIUM CHLORIDE 0.9 % IV SOLN
12.5000 mg | Freq: Four times a day (QID) | INTRAVENOUS | Status: DC | PRN
  Administered 2020-08-27: 12.5 mg via INTRAVENOUS
  Filled 2020-08-27 (×3): qty 0.5

## 2020-08-27 MED ORDER — PHENYLEPHRINE 40 MCG/ML (10ML) SYRINGE FOR IV PUSH (FOR BLOOD PRESSURE SUPPORT)
80.0000 ug | PREFILLED_SYRINGE | INTRAVENOUS | Status: AC | PRN
  Administered 2020-08-27 – 2020-08-28 (×3): 80 ug via INTRAVENOUS

## 2020-08-27 MED ORDER — MISOPROSTOL 50MCG HALF TABLET
50.0000 ug | ORAL_TABLET | ORAL | Status: DC
  Administered 2020-08-27 (×3): 50 ug via BUCCAL
  Filled 2020-08-27 (×3): qty 1

## 2020-08-27 MED ORDER — FENTANYL-BUPIVACAINE-NACL 0.5-0.125-0.9 MG/250ML-% EP SOLN
12.0000 mL/h | EPIDURAL | Status: DC | PRN
  Administered 2020-08-27: 12 mL/h via EPIDURAL
  Filled 2020-08-27: qty 250

## 2020-08-27 MED ORDER — LIDOCAINE HCL (PF) 1 % IJ SOLN
INTRAMUSCULAR | Status: DC | PRN
  Administered 2020-08-27: 11 mL via EPIDURAL

## 2020-08-27 MED ORDER — LACTATED RINGERS IV SOLN: 500.0000 mL | Freq: Once | INTRAVENOUS | Status: DC

## 2020-08-27 NOTE — Progress Notes (Signed)
Labor Progress Note ADRIJANA HAROS is a 25 y.o. G2P0010 at [redacted]w[redacted]d presented for IOL-mild polyhydramnios S: Patient is resting comfortably.  O:  BP 102/66   Pulse 69   Temp (!) 96.4 F (35.8 C) (Axillary)   Resp 15   Ht 5\' 3"  (1.6 m)   Wt 97.3 kg   LMP 11/26/2019   SpO2 100%   BMI 38.00 kg/m  EFM: baseline 110 BPM/few accels/-decels/min variability Toco: contractions q5-64min   CVE: Dilation: 6 Effacement (%): 90 Cervical Position: Middle Station: -1 Presentation: Vertex Exam by:: [email or web address], CNM   A&P: 25 y.o. G2P0010 [redacted]w[redacted]d IOL-mild polyhydramnios #Labor: S/p Cytotec x3, FB dislodged, AROM@1915 -clear fluid. Pt remains unchanged on check. Will start pit 2/2.  #Pain: Epidural  #FWB: Has been cat 2-overall reassuring-currently working with peanut ball and position changes. Will monitor.  #Anxiety/Depression: Gabapentin 300mg  TID-ordered home dose #Substance use disorder: Subutex 8mg  TID-no withdrawal symptoms, UDS neg, home dose ordered #Possible GDM: CBG: 91>60>91. Continue monitoring. HA1C-5.1 #Anemia: Hgb 8.8-Iron PP #Alpha thal carrier #SUA  [redacted]w[redacted]d, MD Center for , [facility] 9:47 PM

## 2020-08-27 NOTE — Progress Notes (Signed)
Hypoglycemic Event  CBG: 60  Treatment: 4 oz juice/soda  Symptoms: None  Follow-up CBG: Time: 7:15pm CBG Result:n/a  Possible Reasons for Event: Inadequate meal intake  Comments/MD notified:Dr. Pray/Dr.Kraus  Next oncoming nurse to repeat after she drinks 4oz of juice.   Garald Braver

## 2020-08-27 NOTE — Progress Notes (Addendum)
Labor Progress Note [name] is a 25 y.o. G2P0010 at [redacted]w[redacted]d presented for IOL-mild polyhydramnios.  S: Patient is resting comfortably.  O:  BP 109/68 (BP Location: Left Arm)   Pulse 72   Temp 98.7 F (37.1 C) (Oral)   Resp 16   Ht 5\' 3"  (1.6 m)   Wt 97.3 kg   LMP 11/26/2019   BMI 38.00 kg/m  EFM: baseline 140BPM/+accels/-decels/mod variability Toco: ~q4-42min contractions   CVE: Dilation: 1 Effacement (%): Thick Cervical Position: Posterior Station: -3 Presentation: Vertex Exam by:: [email or web address], RN   A&P: 25 y.o. G2P0010 [redacted]w[redacted]d IOL-mild polyhydramnios #Labor: S/p cytotec x3. Discussed option of inserting FB with pt. She would like to wait a couple of hours before attempting the balloon.  #Pain: prn, epidural as desired  #FWB: cat 1 #GBS negative   [redacted]w[redacted]d, MD Center for [name], [facility] 7:52 AM   Attestation of Supervision of Student:  I confirm that I have verified the information documented in the  resident  student's note and that I have also personally reperformed the history, physical exam and all medical decision making activities.  I have verified that all services and findings are accurately documented in this student's note; and I agree with management and plan as outlined in the documentation. I have also made any necessary editorial changes.  Patient expressing anxiety about balloon; desiring to wait until later in the morning. Patient will eat breakfast and reconsider. Continue checking blood sugars as patient did not have 2 hour GTT.   [facility], CNM Center for Melinda Land, [facility] 08/29/2020 12:25 PM

## 2020-08-27 NOTE — Progress Notes (Signed)
Labor Progress Note CHIQUITTA MATTY is a 25 y.o. G2P0010 at [redacted]w[redacted]d presented for IOL-mild polyhydramnios S:  Patient resting comfortably. Requesting AROM  O:  BP 109/64 (BP Location: Left Arm)   Pulse 84   Temp 97.6 F (36.4 C) (Oral)   Resp 15   Ht 5\' 3"  (1.6 m)   Wt 97.3 kg   LMP 11/26/2019   SpO2 100%   BMI 38.00 kg/m  EFM: 110-115 bpm/minimal tomoderate variability/+accels, - decels Toco: q1-10min  CVE: 6cm after AROM /80% effaced/0 station with head well applied   A&P: 25 y.o. G2P0010 [redacted]w[redacted]d IOL mild polyhydramnios #IOL: Progressing well. Cytotec x3, Fb out. Bulging bag noted on exam, progessing well without further augmentation, discussed risks/benefits of artificial rupture of membranes including risk of cord prolapse, patient aware and agreeable. AROM with clear fluid at 1915, infant tolerated well. #Pain: Epidural in place #FWB: Category II, overall reassuring, will continue to monitor #GBS negative #Anxiety/Depression: Gabapentin 300mg  TID-ordered home dose #Substance use disorder: Subutex 8mg  TID-no withdrawal symptoms, UDS ordered, home dose ordered #Possible GDM: Last CBG on 7/27 101, no 2 hour glucose test was performed. Will continue with HA1C, q4CBG, have been appropriate so far. #Anemia: Hgb 8.8 #Alpha thal carrier #SUA  , MD 8:05 PM

## 2020-08-27 NOTE — Anesthesia Procedure Notes (Signed)
Epidural Patient location during procedure: OB Start time: 08/27/2020 12:21 PM End time: 08/27/2020 12:35 PM  Staffing Anesthesiologist: Lowella Curb, MD Performed: anesthesiologist   Preanesthetic Checklist Completed: patient identified, IV checked, site marked, risks and benefits discussed, surgical consent, monitors and equipment checked, pre-op evaluation and timeout performed  Epidural Patient position: sitting Prep: ChloraPrep Patient monitoring: heart rate, cardiac monitor, continuous pulse ox and blood pressure Approach: midline Location: L2-L3 Injection technique: LOR saline  Needle:  Needle type: Tuohy  Needle gauge: 17 G Needle length: 9 cm Needle insertion depth: 6 cm Catheter type: closed end flexible Catheter size: 20 Guage Catheter at skin depth: 10 cm Test dose: negative  Assessment Events: blood not aspirated, injection not painful, no injection resistance, no paresthesia and negative IV test  Additional Notes Reason for block:procedure for pain

## 2020-08-27 NOTE — Progress Notes (Signed)
Labor Progress Note [name] is a 25 y.o. G2P0010 at [redacted]w[redacted]d presented for IOL-mild polyhydramnios S:  Patient resting comfortably. Has not received her phenergan yet but would like it  O:  BP 108/63   Pulse 82   Temp 98.7 F (37.1 C) (Oral)   Resp 18   Ht 5\' 3"  (1.6 m)   Wt 97.3 kg   LMP 11/26/2019   SpO2 100%   BMI 38.00 kg/m  EFM: 110 bpm/moderate variability/+accels, - decels Toco: q1-63min  CVE: Dilation: 5.5 Effacement (%): 90 Cervical Position: Middle Station: -3 Presentation: Vertex Exam by:: [email or web address] RN   A&P: 25 y.o. G2P0010 [redacted]w[redacted]d IOL mild polyhydramnios #IOL: Progressing well. Cytotec x3, foley bulb out and will start pitocin if  contractions space out #Pain: Epidural in place #FWB: Cat 1 #GBS negative #Anxiety/Depression: Gabapentin 300mg  TID-ordered home dose #Substance use disorder: Subutex 8mg  TID-no withdrawal symptoms, UDS ordered, home dose ordered #Possible GDM: Last CBG on 7/27 101, no 2 hour glucose test was performed. Will continue with HA1C, q4CBG. #Anemia: Hgb 8.8 #Alpha thal carrier #SUA  , MD 3:31 PM

## 2020-08-27 NOTE — Anesthesia Preprocedure Evaluation (Signed)
Anesthesia Evaluation  Patient identified by MRN, date of birth, ID band Patient awake    Reviewed: Allergy & Precautions, H&P , NPO status , Patient's Chart, lab work & pertinent test results  History of Anesthesia Complications Negative for: history of anesthetic complications  Airway Mallampati: I  TM Distance: >3 FB Neck ROM: Full    Dental no notable dental hx. (+) Teeth Intact, Dental Advisory Given   Pulmonary neg pulmonary ROS,    Pulmonary exam normal breath sounds clear to auscultation       Cardiovascular negative cardio ROS Normal cardiovascular exam Rhythm:Regular Rate:Normal     Neuro/Psych  Headaches, Anxiety Depression    GI/Hepatic Neg liver ROS, GERD  Poorly Controlled,Nausea, vomiting, abd pain   Endo/Other  negative endocrine ROS  Renal/GU negative Renal ROS     Musculoskeletal   Abdominal (+) + obese,   Peds  Hematology   Anesthesia Other Findings   Reproductive/Obstetrics (+) Pregnancy LMP presently                             Anesthesia Physical  Anesthesia Plan  ASA: II  Anesthesia Plan: Epidural   Post-op Pain Management:    Induction:   PONV Risk Score and Plan: Treatment may vary due to age or medical condition  Airway Management Planned: Natural Airway  Additional Equipment:   Intra-op Plan:   Post-operative Plan:   Informed Consent: I have reviewed the patients History and Physical, chart, labs and discussed the procedure including the risks, benefits and alternatives for the proposed anesthesia with the patient or authorized representative who has indicated his/her understanding and acceptance.     Dental advisory given  Plan Discussed with: CRNA and Surgeon  Anesthesia Plan Comments:         Anesthesia Quick Evaluation

## 2020-08-27 NOTE — Progress Notes (Signed)
Labor Progress Note [name] is a 25 y.o. G2P0010 at [redacted]w[redacted]d presented for IOL-mild polyhydramnios S:  Patient resting comfortably. Discussed foley bulb insertion with patient. Patient declining at this time for foley bulb placement as she wants to try and dilate on her own.  O:  BP 109/68 (BP Location: Left Arm)   Pulse 72   Temp 98.7 F (37.1 C) (Oral)   Resp 16   Ht 5\' 3"  (1.6 m)   Wt 97.3 kg   LMP 11/26/2019   BMI 38.00 kg/m  EFM: 130 bpm/moderate variability/+accels, - decels Toco: q1-73min  CVE: Dilation: 1.5 Effacement (%): Thick Cervical Position: Posterior Station: -3 Presentation: Vertex Exam by:: K. Kooistra, CNM   A&P: 25 y.o. G2P0010 [redacted]w[redacted]d IOL mild polyhydramnios #IOL: Progressing well. Cytotec x3 now, will consider foley bulb next check if no change #Pain: IV pain medicine PRN vs Epidural as desired #FWB: Cat 1 #GBS negative #Anxiety/Depression: Gabapentin 300mg  TID-ordered home dose #Substance use disorder: Subutex 8mg  TID-no withdrawal symptoms, UDS ordered, home dose ordered #Possible GDM: Last CBG on 7/27 101, no 2 hour glucose test was performed. Will continue with HA1C, q4CBG. #Anemia: Hgb 8.8 #Alpha thal carrier #SUA  , MD 9:04 AM

## 2020-08-28 ENCOUNTER — Encounter (HOSPITAL_COMMUNITY): Payer: Self-pay | Admitting: Obstetrics and Gynecology

## 2020-08-28 DIAGNOSIS — O403XX0 Polyhydramnios, third trimester, not applicable or unspecified: Secondary | ICD-10-CM

## 2020-08-28 DIAGNOSIS — Z3A39 39 weeks gestation of pregnancy: Secondary | ICD-10-CM

## 2020-08-28 MED ORDER — ONDANSETRON HCL 4 MG PO TABS: 4.0000 mg | ORAL_TABLET | ORAL | Status: DC | PRN

## 2020-08-28 MED ORDER — DIBUCAINE (PERIANAL) 1 % EX OINT: 1.0000 "application " | TOPICAL_OINTMENT | CUTANEOUS | Status: DC | PRN

## 2020-08-28 MED ORDER — DIPHENHYDRAMINE HCL 25 MG PO CAPS: 25.0000 mg | ORAL_CAPSULE | Freq: Four times a day (QID) | ORAL | Status: DC | PRN

## 2020-08-28 MED ORDER — ACETAMINOPHEN 325 MG PO TABS
650.0000 mg | ORAL_TABLET | ORAL | Status: DC | PRN
  Administered 2020-08-28 – 2020-08-30 (×3): 650 mg via ORAL
  Filled 2020-08-28 (×3): qty 2

## 2020-08-28 MED ORDER — BUPRENORPHINE HCL 8 MG SL SUBL
8.0000 mg | SUBLINGUAL_TABLET | Freq: Three times a day (TID) | SUBLINGUAL | Status: DC
Start: 2020-08-28 — End: 2020-08-30
  Administered 2020-08-28 – 2020-08-30 (×7): 8 mg via SUBLINGUAL
  Filled 2020-08-28 (×7): qty 1

## 2020-08-28 MED ORDER — TETANUS-DIPHTH-ACELL PERTUSSIS 5-2.5-18.5 LF-MCG/0.5 IM SUSY: 0.5000 mL | PREFILLED_SYRINGE | Freq: Once | INTRAMUSCULAR | Status: DC

## 2020-08-28 MED ORDER — IBUPROFEN 600 MG PO TABS
600.0000 mg | ORAL_TABLET | Freq: Four times a day (QID) | ORAL | Status: DC
  Administered 2020-08-28 – 2020-08-30 (×8): 600 mg via ORAL
  Filled 2020-08-28 (×9): qty 1

## 2020-08-28 MED ORDER — COCONUT OIL OIL: 1.0000 "application " | TOPICAL_OIL | Status: DC | PRN

## 2020-08-28 MED ORDER — GABAPENTIN 300 MG PO CAPS
300.0000 mg | ORAL_CAPSULE | Freq: Three times a day (TID) | ORAL | Status: DC
  Administered 2020-08-28 – 2020-08-30 (×7): 300 mg via ORAL
  Filled 2020-08-28 (×7): qty 1

## 2020-08-28 MED ORDER — SENNOSIDES-DOCUSATE SODIUM 8.6-50 MG PO TABS
2.0000 | ORAL_TABLET | Freq: Every day | ORAL | Status: DC
  Administered 2020-08-29 – 2020-08-30 (×2): 2 via ORAL
  Filled 2020-08-28 (×2): qty 2

## 2020-08-28 MED ORDER — BENZOCAINE-MENTHOL 20-0.5 % EX AERO
1.0000 "application " | INHALATION_SPRAY | CUTANEOUS | Status: DC | PRN
  Administered 2020-08-28: 1 via TOPICAL
  Filled 2020-08-28: qty 56

## 2020-08-28 MED ORDER — WITCH HAZEL-GLYCERIN EX PADS: 1.0000 "application " | MEDICATED_PAD | CUTANEOUS | Status: DC | PRN

## 2020-08-28 MED ORDER — PRENATAL MULTIVITAMIN CH
1.0000 | ORAL_TABLET | Freq: Every day | ORAL | Status: DC
  Administered 2020-08-28 – 2020-08-30 (×3): 1 via ORAL
  Filled 2020-08-28 (×3): qty 1

## 2020-08-28 MED ORDER — ONDANSETRON HCL 4 MG/2ML IJ SOLN: 4.0000 mg | INTRAMUSCULAR | Status: DC | PRN

## 2020-08-28 MED ORDER — SIMETHICONE 80 MG PO CHEW: 80.0000 mg | CHEWABLE_TABLET | ORAL | Status: DC | PRN

## 2020-08-28 NOTE — Lactation Note (Addendum)
This note was copied from a baby's chart. Lactation Consultation Note  Patient Name: [name] [id number] Date: 08/28/2020 Reason for consult: Initial assessment Age:24 hours  P1, Mother taught hand expression. After massage and attempt to hand express several times, Obtained tiny clear drop of colostrum .   Mother was given a hand pump and sat up with a DEBP.  Assist mother with pumping. She was fit with a #21 flange , no colostrum obtained. Advised mother to pump every 3 hours until infant is feeding well. Mother reports that she has a DEBP at home.  Assist mother with positioning and attempt to latch infant on in football hold. Infant showing no signs of interest . No feeding cues. Infant refused suckling on a gloved finger.   Mother was given Nurse, learning disability and basic teaching done. Father of infant asleep at the bedside.  Encouraged mother to call for assistance with latching infant . Advised mother to attempt to feed infant with all feeding cues. Mother was taught to use bulb syringe and it was placed in crib bedside of infant.   Maternal Data Has patient been taught Hand Expression?: Yes Does the patient have breastfeeding experience prior to this delivery?: No  Feeding Mother's Current Feeding Choice: Breast Milk and Formula Nipple Type: Slow - flow  LATCH Score                    Lactation Tools Discussed/Used    Interventions    Discharge    Consult Status      Michel Bickers 08/28/2020, 8:27 AM

## 2020-08-28 NOTE — Anesthesia Postprocedure Evaluation (Signed)
Anesthesia Post Note  Patient: CAMRI MOLLOY  Procedure(s) Performed: AN AD HOC LABOR EPIDURAL     Patient location during evaluation: Mother Baby Anesthesia Type: Epidural Level of consciousness: awake and alert Pain management: pain level controlled Vital Signs Assessment: post-procedure vital signs reviewed and stable Respiratory status: spontaneous breathing, nonlabored ventilation and respiratory function stable Cardiovascular status: stable Postop Assessment: no headache, no backache and epidural receding Anesthetic complications: no   No notable events documented.  Last Vitals:  Vitals:   08/28/20 0838 08/28/20 1245  BP: 113/70 97/62  Pulse: 73 88  Resp: 18 17  Temp: (!) 36.4 C 36.7 C  SpO2: 100% 100%    Last Pain:  Vitals:   08/28/20 1549  TempSrc:   PainSc: 0-No pain   Pain Goal:                   Juni Glaab

## 2020-08-28 NOTE — Clinical Social Work Maternal (Signed)
CLINICAL SOCIAL WORK MATERNAL/CHILD NOTE  Patient Details  Name: [name] MRN: [id number] Date of Birth: 05/09/1995  Date:  03-04-20  Clinical Social Worker Initiating Note:  Darra Lis, [place] Date/Time: Initiated:  2020-04-25/1100     Child's Name:  [name].   Biological Parents:  Mother, Father Sylvio Weatherall 09/08/1991)   Need for Interpreter:  None   Reason for Referral:  Behavioral Health Concerns, Current Substance Use/Substance Use During Pregnancy     Address:  [address] [place] [address]    Phone number:  [phone] (home)     Additional phone number:   Household Members/Support Persons (HM/SP):   Household Member/Support Person 1   HM/SP Name Relationship DOB or Age  HM/SP -1 Dewitte Vannice Significant Other/FOB 09/08/1991  HM/SP -2        HM/SP -3        HM/SP -4        HM/SP -5        HM/SP -6        HM/SP -7        HM/SP -8          Natural Supports (not living in the home):  Immediate Family   Professional Supports: None   Employment: Unemployed   Type of Work:     Education:  Other (comment) (8th grade)   Homebound arranged:    Museum/gallery curator Resources:  Medicaid   Other Resources:  Physicist, medical  , [place] Considerations Which May Impact Care:    Strengths:  Ability to meet basic needs  , Engineer, materials, Home prepared for child     Psychotropic Medications:         Pediatrician:    [place]olic[place] area  Pediatrician List:   [place] Other ([facility])  [place]      Pediatrician Fax Number:    Risk Factors/Current Problems:  Substance Use     Cognitive State:  Linear Thinking  , Goal Oriented  , Alert     Mood/Affect:  Interested  , Calm  , Relaxed  , Happy     CSW Assessment: CSW consulted for "Hx polysubstance abuse currently on Subutex, Hx ADHD/Anx/Dep". CSW met with MOB to  assess and offer support. FOB was leaving the room when CSW entered. CSW observed infant sleeping in bassinet. CSW informed MOB of reason for consult. MOB was understanding and receptive to visit. CSW inquired on MOB current emotions. MOB reported she is currently doing better than expected. MOB shared she is feeling okay emotionally. CSW inquired on MOB social situation. MOB stated her and FOB live together. MOB is currently unemployed and FOB works full-time for [employer]. MOB is enrolled in [facility] and has submitted a food stamp application.  CSW inquired on MOB's mental health history. MOB reported she has a diagnosis of anxiety, depression, ADHD and Auditory Processing Disorder. MOB stated she was first diagnosed at age 29. MOB stated she had a really good pregnancy, however she did struggle with a lot of anxiety. MOB shared she cried a lot and maintaining sobriety was challenging due to the fact that she previously coped by using drugs. CSW expressed understanding and validated MOB feelings. CSW encouraged MOB to identify coping skills such as exercising, writing or other self care methods. MOB commended MOB on maintaining her sobriety during the pregnancy.  MOB reported that she was on Risperidone prior to pregnancy and had to stop during the pregnancy. MOB stated she plans to restart postpartum with her OBGYN or PCP. CSW asked MOB if she she think her auditory processing disorder will interfere with her ability to care for infant. MOB reported "no," and stated it just takes her a little longer to learn things at times. MOB stated it impacted her more in the school setting. CSW discussed MOB previous hospitalizations. MOB reported she was hospitalized in November due to her mother filing an IVC, however she was not experiencing any SI or HI. MOB disclosed the previous hospitalizations (2014 & 2019) she had were due to SI and depression. CSW asked MOB when she last experienced SI, she reported it has been at least two  years. MOB stated the thoughts were passive and she has never had a plan. MOB shared she does worry about PPD, due to all of the stories she has heard. CSW was understanding and encouraged MOB to utilize supports and engage in mental health services postpartum to adapt to the adjustment. MOB was receptive and reported she feels comfortable reaching out to a professional or supports if mental health needs arise. MOB expressed interest in therapy postpartum. MOB identifies FOB, her mother, sister, aunt and grandmother as supports.  MOB denies any current SI, HI or being involved in DV.   CSW inquired on MOB substance use history. MOB reported she has been on Subutex since December 2021. MOB stated prior to being on Subutex 8mg she was abusing Xanax, pain pills and Heroine. MOB is managed through [facility] once a month. MOB reported she still has cravings at time, however the Subutex is extremely helpful. CSW asked MOB how she copes with the cravings. MOB stated "I know I will lose him if I use," referring to infant. MOB reported she last used substances in November of 2021. MOB denies FOB abuses substances. CSW informed MOB of the hospital drug screen policy. MOB aware an UDS/CDS is performed on infant and a CPS report will be made if infant test positive for any substance other than prescribed. CSW informed MOB that a CPS notification is made if infant is positive for any substance prescribed, to ensure proper outpatient referrals are made. MOB was understanding and denied any questions.   CSW provided education regarding the baby blues period versus PPD and provided resources. CSW provided the New Mom Checklist and encouraged MOB to self evaluate and contact a medical professional if symptoms are noted at any time.   CSW provided review of Sudden Infant Death Syndrome (SIDS) precautions. MOB stated she has a bassinet, car seat and all infant needs. MOB reported she does not have a car, however she  is never in need of a ride considering family and friends help. MOB denies any barriers to infant follow-up care.  MOB open to all referrals for community resources. CSW completed referral for Healthy Start and Parents as Teachers. [facility] referral will be completed with CPS notification.   CSW will continue to follow CDS/UDS and make a CPS report if warranted. CSW identifies no further need for intervention and no barriers to discharge at this time.  CSW Plan/Description:  CSW Will Continue to Monitor Umbilical Cord Tissue Drug Screen Results and Make Report if Warranted, Child Protective Service Report  , Hospital Drug Screen Policy Information, Perinatal Mood and Anxiety Disorder (PMADs) Education, Sudden Infant Death Syndrome (SIDS) Education, No Further Intervention Required/No Barriers to   Discharge, Other Information/Referral to Community Resources, Other Patient/Family Education    Alyxandra Tenbrink J Renne Platts, LCSWA 08/28/2020, 12:25 PM 

## 2020-08-28 NOTE — Discharge Summary (Addendum)
Postpartum Discharge Summary     Patient Name: [name] DOB: 1995/11/15 MRN: [id number]  Date of admission: 08/26/2020 Delivery date:08/28/2020  Delivering provider: Erskine Emery  Date of discharge: 08/30/2020  Admitting diagnosis: Polyhydramnios affecting pregnancy [O40.9XX0] Intrauterine pregnancy: [redacted]w[redacted]d    Secondary diagnosis:  Active Problems:   Polyhydramnios affecting pregnancy  Additional problems: substance use disorder, anxiety and depression; SUA    Discharge diagnosis: Term Pregnancy Delivered and suspected GDM                                               Post partum procedures: None Augmentation: AROM, Pitocin, Cytotec, and IP Foley Complications: None  Hospital course: Induction of Labor With Vaginal Delivery   25y.o. yo G2P0010 at 379w3das admitted to the hospital 08/26/2020 for induction of labor.  Indication for induction:  polyhydramnios .  Patient had an uncomplicated labor course as follows: Membrane Rupture Time/Date: 7:14 PM ,08/27/2020   Delivery Method:Vaginal, Spontaneous  Episiotomy: None  Lacerations:  1st degree  Details of delivery can be found in separate delivery note.  Patient had a routine postpartum course. Patient is discharged home 08/30/20.  Newborn Data: Birth date:08/28/2020  Birth time:1:05 AM  Gender:Female  Living status:Living  Apgars:8 ,9  Weight:3711 g   Magnesium Sulfate received: No BMZ received: No Rhophylac:N/A MMR:N/A T-DaP:Given prenatally Flu: No Transfusion:No  Physical exam  Vitals:   08/28/20 1245 08/29/20 0616 08/29/20 1410 08/30/20 0601  BP: 97/62 108/63 118/64 103/69  Pulse: 88 92 90 87  Resp: _0 Temp: 98 F (36.7 C) 97.8 F (36.6 C) 98.1 F (36.7 C) 98.2 F (36.8 C)  TempSrc: Oral Oral Oral Oral  SpO2: 100% 100% 100% 100%  Weight:      Height:       General: alert, cooperative, and no distress Lochia: appropriate Uterine Fundus: firm Incision: N/A DVT Evaluation: No evidence of DVT  seen on physical exam. Labs: Lab Results  Component Value Date   WBC 8.7 08/26/2020   HGB 8.8 (L) 08/26/2020   HCT 27.5 (L) 08/26/2020   MCV 85.4 08/26/2020   PLT 203 08/26/2020   CMP Latest Ref Rng & Units 03/10/2020  Glucose 70 - 99 mg/dL 87  BUN 6 - 20 mg/dL 8  Creatinine 0.44 - 1.00 mg/dL 0.58  Sodium 135 - 145 mmol/L 136  Potassium 3.5 - 5.1 mmol/L 3.9  Chloride 98 - 111 mmol/L 104  CO2 22 - 32 mmol/L 22  Calcium 8.9 - 10.3 mg/dL 8.9  Total Protein 6.5 - 8.1 g/dL 6.6  Total Bilirubin 0.3 - 1.2 mg/dL 1.1  Alkaline Phos 38 - 126 U/L 53  AST 15 - 41 U/L 16  ALT 0 - 44 U/L 14   Edinburgh Score: Edinburgh Postnatal Depression Scale Screening Tool 08/28/2020  I have been able to laugh and see the funny side of things. 0  I have looked forward with enjoyment to things. 0  I have blamed myself unnecessarily when things went wrong. 2  I have been anxious or worried for no good reason. 2  I have felt scared or panicky for no good reason. 3  Things have been getting on top of me. 3  I have been so unhappy that I have had difficulty sleeping. 3  I have felt sad or miserable.  1  I have been so unhappy that I have been crying. 1  The thought of harming myself has occurred to me. 0  Edinburgh Postnatal Depression Scale Total 15     After visit meds:  Allergies as of 08/30/2020       Reactions   Blueberry Fruit Extract Anaphylaxis   Contrast Media [iodinated Diagnostic Agents] Anaphylaxis, Rash   Codeine Hives, Itching, Nausea And Vomiting   Omnipaque [iohexol] Itching, Nausea And Vomiting, Swelling        Medication List     STOP taking these medications    Blood Pressure Kit Devi     cyclobenzaprine 10 MG tablet Commonly known as: FLEXERIL     polyethylene glycol 17 g packet Commonly known as: MiraLax   Vitafol Ultra 29-0.6-0.4-200 MG Caps       TAKE these medications    acetaminophen 325 MG tablet Commonly known as: Tylenol Take 2 tablets (650 mg total)  by mouth every 4 (four) hours as needed (for pain scale < 4).   benzocaine-Menthol 20-0.5 % Aero Commonly known as: DERMOPLAST Apply 1 application topically as needed for irritation (perineal discomfort).   coconut oil Oil Apply 1 application topically as needed.   ferrous sulfate 325 (65 FE) MG tablet Commonly known as: FerrouSul Take 1 tablet (325 mg total) by mouth every other day.   ibuprofen 600 MG tablet Commonly known as: ADVIL Take 1 tablet (600 mg total) by mouth every 6 (six) hours.   omeprazole 20 MG capsule Commonly known as: PRILOSEC Take 1 capsule (20 mg total) by mouth daily.   prenatal multivitamin Tabs tablet Take 1 tablet by mouth daily at 12 noon.   promethazine 25 MG tablet Commonly known as: PHENERGAN TAKE 1 TABLET BY MOUTH TWICE DAILY AS NEEDED FOR NAUSEA. IF NOT TOLERATED BY MOUTH, YOU CAN PLACE THEM IN THE VAGINA OR RECTUM INSTEAD.         Discharge home in stable condition Infant Feeding: Breast Infant Disposition:home with mother Discharge instruction: per After Visit Summary and Postpartum booklet. Activity: Advance as tolerated. Pelvic rest for 6 weeks.  Diet: routine diet -- F/U for subutex with IM -- F/U with psych for the risperdal -- Counseled pt on signs and symptoms of PPD and that she should come in if she experiences any of these  -- Hgb 8.8-home with PO iron   Future Appointments: Future Appointments  Date Time Provider [place]  10/02/2020  2:15 PM Burleson, Rona Ravens, NP [facility]   Follow up Visit:   Message sent by Maryagnes Amos, CNM Please schedule this patient for a In person postpartum visit in 6 weeks with the following provider: Any provider. Additional Postpartum F/U:2 hour GTT  High risk pregnancy complicated by:  polyhydramnios Delivery mode:  Vaginal, Spontaneous  Anticipated Birth Control:  POPs   08/30/2020 Erskine Emery, MD  CNM attestation I have seen and examined this patient and agree with  above documentation in the resident's note.   [name] is a 25 y.o. G2P1011 s/p vag del.   Pain is well controlled.  Plan for birth control is oral progesterone-only contraceptive.  Method of Feeding: breast  PE:  BP 103/69 (BP Location: Left Arm)   Pulse 87   Temp 98.2 F (36.8 C) (Oral)   Resp 16   Ht 5' 3" (1.6 m)   Wt 97.3 kg   LMP 11/26/2019   SpO2 100%   Breastfeeding Unknown   BMI 38.00 kg/m  Fundus firm  No results for input(s): HGB, HCT in the last 72 hours.   Plan: discharge today - postpartum care discussed - f/u clinic in 6 weeks for postpartum visit; msg also sent to [facility] to schedule a 1-2wk f/u either with a provider or with Roselyn Reef for a mental health check   Myrtis Ser, CNM 10:24 PM 08/30/2020

## 2020-08-29 MED ORDER — FERROUS SULFATE 325 (65 FE) MG PO TABS: 325.0000 mg | ORAL_TABLET | ORAL | Status: DC

## 2020-08-29 MED ORDER — FERROUS SULFATE 325 (65 FE) MG PO TABS: 325.0000 mg | ORAL_TABLET | Freq: Every day | ORAL | Status: DC

## 2020-08-29 NOTE — Progress Notes (Signed)
POSTPARTUM PROGRESS NOTE  Post Partum Day 1  Subjective:  [name] is a 25 y.o. G2P1011 s/p VD at [redacted]w[redacted]d.  She reports she is doing well, just feeling alittle tired. No acute events overnight. She denies any problems with ambulating, voiding or po intake. Denies nausea or vomiting.  Pain is moderately controlled.  Lochia is mild  Denies any lightheadedness, dizziness, CP/SOB.   Objective: Blood pressure 108/63, pulse 92, temperature 97.8 F (36.6 C), temperature source Oral, resp. rate 16, height 5\' 3"  (1.6 m), weight 97.3 kg, last menstrual period 11/26/2019, SpO2 100 %, unknown if currently breastfeeding.  Physical Exam:  General: alert, cooperative and no distress Chest: no respiratory distress Heart:regular rate, distal pulses intact  Uterine Fundus: firm, appropriately tender DVT Evaluation: No calf swelling or tenderness Extremities: pedal edema bilaterally  Skin: warm, dry  Recent Labs    08/26/20 2345  HGB 8.8*  HCT 27.5*    Assessment/Plan: ELZINA DEVERA is a 25 y.o. G2P1011 s/p VD at [redacted]w[redacted]d   PPD#1 - Doing well  Routine postpartum care  Substance use disorder:  --Subutex 8mg  TID, tolerating well  --SW consulted, will follow CDS/UDS, no barriers to discharge   Anxiety/depression: Seen by SW.  --cont gabapentin 300mg  TID home dose as is   Anemia:  Hgb 8.8, from 9.6 in 06/2020. Asymptomatic.  --Start ferrous sulfate daily   Contraception: undecided, considering nexplanon Feeding: bottle/attempting breast. Counseled on breastmilk supply and demand.    Dispo: Plan for discharge tomorrow.   LOS: 3 days   , DO  OB Fellow  08/29/2020, 1:53 PM

## 2020-08-29 NOTE — Social Work (Signed)
CSW acknowledges consult for MOB scoring a 15 on the Edinburgh Postpartum Depression Scale.   CSW has completed a full assessment with MOB and provided education and resources regarding PPD. CSW has also completed referrals for community agencies that can be of assistance to MOB.   CSW identifies no further need for intervention and no barriers to discharge at this time.  Ridge Lafond, LCSWA Clinical Social Work [facility] [phone]  

## 2020-08-29 NOTE — Progress Notes (Signed)
RN called and told me that patient has anxiety and depression, [place] with score of 15. Patient reports previously being well controlled on risperdal for anxiety and depression, has a psychiatrist. Risperdal was reportedly stopped prior to pregnancy, patient wants to know if she could be prescribed this here, or must she follow up with psychiatry.  Certainly given [place] patient warrants medical treatment as well as therapy, however an antipsychotic is not first line treatment for PPD or MDD/GAD. Recommend this is discussed with patient tomorrow at postpartum rounds, she should absolutely follow up with psychiatry.  Shirlean Mylar, MD [facility], PGY-3

## 2020-08-30 ENCOUNTER — Other Ambulatory Visit (HOSPITAL_COMMUNITY): Payer: Self-pay

## 2020-08-30 ENCOUNTER — Ambulatory Visit: Payer: Self-pay

## 2020-08-30 MED ORDER — IBUPROFEN 600 MG PO TABS
600.0000 mg | ORAL_TABLET | Freq: Four times a day (QID) | ORAL | 0 refills | Status: DC
  Filled 2020-08-30: qty 30, 8d supply, fill #0

## 2020-08-30 MED ORDER — COCONUT OIL OIL
1.0000 | TOPICAL_OIL | 0 refills | Status: DC | PRN
Start: 2020-08-30 — End: 2021-01-21

## 2020-08-30 MED ORDER — FERROUS SULFATE 325 (65 FE) MG PO TABS
325.0000 mg | ORAL_TABLET | ORAL | 1 refills | Status: DC
  Filled 2020-08-30: qty 60, 120d supply, fill #0

## 2020-08-30 MED ORDER — FERROUS SULFATE 325 (65 FE) MG PO TABS
325.0000 mg | ORAL_TABLET | ORAL | 3 refills | Status: DC
  Filled 2020-08-30: qty 30, 60d supply, fill #0

## 2020-08-30 MED ORDER — PRENATAL MULTIVITAMIN CH: 1.0000 | ORAL_TABLET | Freq: Every day | ORAL | Status: DC

## 2020-08-30 MED ORDER — ACETAMINOPHEN 325 MG PO TABS
650.0000 mg | ORAL_TABLET | ORAL | Status: DC | PRN
Start: 2020-08-30 — End: 2022-04-17

## 2020-08-30 MED ORDER — BENZOCAINE-MENTHOL 20-0.5 % EX AERO: 1.0000 "application " | INHALATION_SPRAY | CUTANEOUS | Status: DC | PRN

## 2020-08-30 MED ORDER — NORETHINDRONE 0.35 MG PO TABS
1.0000 | ORAL_TABLET | Freq: Every day | ORAL | 11 refills | Status: DC
  Filled 2020-08-30: qty 28, 28d supply, fill #0

## 2020-08-30 NOTE — Lactation Note (Signed)
This note was copied from a baby's chart. Lactation Consultation Note  Patient Name: [name] [id number] Date: 08/30/2020 Reason for consult: Follow-up assessment;1st time breastfeeding;Primapara;Term;Maternal endocrine disorder Age:24 hours SUD on Subutex   LC in to visit with P1 Mom of term infant at 8.5% weight loss.  Baby currently sleeping on Mom's chest after consuming 35 ml ProTotal Comfort formula by bottle.  Baby has been having some withdrawal symptoms and Mom is complaining of headache.  RN provided medication.  Mom states she hasn't pumped since day one.  Mom shared that she isn't feeling up to pumping or latching currently.    Encouraged STS with baby, recommended breast massage and hand expression for colostrum drops to entice baby.  Mom aware of lactation support and encouraged her to ask her RN for assistance and RN can call LC.  Mom aware that LC would be happy to assist her today.   Lactation Tools Discussed/Used Tools: Bottle;Pump Breast pump type: Double-Electric Breast Pump  Interventions Interventions: Breast feeding basics reviewed;Skin to skin;Breast massage;Hand express;DEBP;Education   Consult Status Consult Status: Follow-up Date: 08/31/20 Follow-up type: In-patient    Judee Clara 08/30/2020, 8:34 AM

## 2020-08-30 NOTE — Progress Notes (Signed)
RN walked into room and observed MOB very drowsy on multiple occasions. FOB not attentive to MOB or infant. MOB having trouble staying awake to attend to infant. RN had concerns of infants safety and when being discharged due to lack of support while inpatient.

## 2020-08-30 NOTE — Social Work (Signed)
CSW consulted for "lack of support inpatient". Per RN note and previous RN verbal reports, MOB has been observed as "drowsy" on multiple occassions. Prior to meeting with MOB, CSW informed by lactation staff that nutrition staff stopped her in the hall and stated MOB was dosing off with infant on her stomach. Lactation staff reported to CSW that MOB did not make eye contact when in the room and appeared to be overwhelmed.  CSW met with MOB to assess and offer support. MOB was up starting breakfast and infant was observed in bassinet. MOB had just called RN to swaddle infant. CSW assessed MOB current emotions. MOB reported she is currently feeling okay. CSW asked MOB if she is feeling bonded with infant, MOB stated yes. CSW informed MOB that she has been observed as being drowsy on multiple occassions. MOB reported she has been tired due to not getting any sleep. MOB stated she had two hours of sleep last night and no sleep the night before. MOB denied being drowsy with infant laying on her and stated the nutrition staff overreacted when she saw MOB lay her head back. CSW inquired on MOB anxiousness. MOB shared she still has some anxiety and is taking Gabapentin. MOB reported she is interested in restarting Risperidone immediately postpartum. CSW reached out to [facility] and left a message for the nurse regarding patient's request. CSW informed by staff that MOB will be personally contacted for follow-up.  MOB denies any current SI or HI. MOB stated she believes she is able to manage her anxiety until she hears from the [facility]. CSW asked MOB if FOB has continued to be supportive. MOB stated FOB has been supportive during stay and her mother will be staying at her home postpartum to provide support. CSW assessed MOB feelings surrounding infant's breast feeding challenges. MOB expressed understanding that she isn't yet developing enough milk and that she fed infant with formula overnight. MOB stated she is okay as long as  infant is feeding. CSW asked MOB if the Subutex is still effective and if she has had any cravings postpartum. MOB reported she is still getting the Subutex and stated she has not had time to have cravings postpartum. MOB stated "I have been so busy here, I haven't had time to think about cravings." MOB denies any additional needs at this time. Although MOB speech seemed to be slightly more delayed than previous interactions, MOB was engaged and responded appropriately to questions. CSW spoke with RN who reported MOB has fed and cared for infant appropriately.   CSW spoke with pediatrician and requested infant temporarily be removed from the room if possible, to allow MOB to sleep in hopes of further assessing her interaction following appropriate rest.   CSW made CPS notification due to substance exposed infant, related to Subutex. CSW available to assist as needed.  CSW will continue to follow UDS/CDS and make an additional CPS report if warranted. No barriers to discharge at this time.  

## 2020-08-30 NOTE — Lactation Note (Signed)
This note was copied from a baby's chart. Lactation Consultation Note  Patient Name: [name] [id number] Date: 08/30/2020 Reason for consult: Mother's request;Engorgement (Mother multiple plugged ducts and painful breasts) Age:24 hours  LC called to room by RN following mother's complaint of breast pain. On arrival, Mom showing signs of engorgement. LC applied ice in a reclining position with reverse pressure using coconut oil. Once able to hand express her milk, LC increased flange size got mom pumping with release of milk.   LC returned to find mother turned off the pump and refused to continue.   Mom did not tolerate well, says to stop, not going to pump anymore and asked [place] to discontinue care.   RN, Doran Heater aware of mother's decision.   Maternal Data    Feeding Nipple Type: Nfant Slow Flow (purple)  LATCH Score                    Lactation Tools Discussed/Used Tools: Pump;Flanges;Coconut oil;Other (comment) (Ice applied 2 to 3 times a day followed by pumping with DEBP to alleviate discomfort.) Flange Size: 27 (she used 21 flange 2x today.) Breast pump type: Double-Electric Breast Pump Reason for Pumping: pumping for engorgement Pumping frequency: every 2 to 3 hrs  Interventions Interventions: Ice;Reverse pressure;DEBP;Breast massage;Coconut oil;Hand express;Expressed milk;Support pillows;Education  Discharge    Consult Status Consult Status: Complete Date: 08/30/20    [name]  [name] 08/30/2020, 10:45 PM

## 2020-08-31 ENCOUNTER — Ambulatory Visit: Payer: Self-pay

## 2020-08-31 NOTE — Lactation Note (Signed)
This note was copied from a baby's chart. Lactation Consultation Note  Patient Name: [name] [id number] Date: 08/31/2020 Reason for consult: Engorgement Age:24 days  LC back to room to help with engorgement. Mother applied ice bags to breasts for 15 minutes twice with a break. Did lymphatic massage with reverse pressure using coconut oil. Per mother, breasts/nipples feel better.  LC reviewed how to use a DEBP and checked flange size. Tried 27 mm, 24 mm and 21.  Encouraged to request [facility] for any needs, support or questions.   Plan: 1-Apply ice to breast for 15 minutes at a time at least 30 minutes prior to pumping.  2-Massage breasts using coconut oil prior to pumping. 3-Pump 8-12 in 24h. 4-Only use heat while actively pumping.   All questions answered at this time.    Maternal Data Has patient been taught Hand Expression?: Yes  Feeding Mother's Current Feeding Choice: Breast Milk and Formula Nipple Type: Nfant Slow Flow (purple)  Lactation Tools Discussed/Used Tools: Pump;Flanges;Coconut oil Flange Size: 27;24;21 Breast pump type: Double-Electric Breast Pump Reason for Pumping: engorgement Pumping frequency: q3 or sooner as needed Pumped volume: 3 mL  Interventions Interventions: Education;Expressed milk;DEBP;Ice;Breast massage;Reverse pressure  Discharge Discharge Education: Engorgement and breast care  Consult Status Consult Status: Follow-up Date: 09/01/20 Follow-up type: In-patient    Braydee Shimkus A Higuera Ancidey 08/31/2020, 11:21 PM

## 2020-08-31 NOTE — Lactation Note (Signed)
This note was copied from a baby's chart. Lactation Consultation Note  Patient Name: Boy Valrie Jia XMIWO'E Date: 08/31/2020 Reason for consult: Engorgement;Maternal endocrine disorder Age:24 days  LC in to room due to engorgement. Mother states breasts are very full, firm and painful. Mother explains she tried pumping but unable to collect any breast milk. LC observed swollen breasts.  LC applied ice for 15 minutes in a laid back position. Talked about massaging breasts and then pumping. Explained that heat should only be used when expressing milk. Explored other alternatives including using cabbage. Mother fell asleep while using ice.  LC will come back at a later time.   Feeding Mother's Current Feeding Choice: Breast Milk and Formula  Lactation Tools Discussed/Used Tools: Pump;Flanges Breast pump type: Double-Electric Breast Pump  Interventions Interventions: Ice;Education;Breast feeding basics reviewed;DEBP  Discharge Discharge Education: Engorgement and breast care  Consult Status Consult Status: Follow-up Date: 08/31/20 Follow-up type: In-patient    Willadean Guyton A Higuera Ancidey 08/31/2020, 2:18 PM

## 2020-09-03 NOTE — BH Specialist Note (Signed)
Pt did not arrive to video visit and did not answer the phone; Left HIPPA-compliant message to call back Mariska Daffin from [facility] at  [phone] (Analeise Mccleery's office).  ?; left MyChart message for patient.  ? ?

## 2020-09-04 ENCOUNTER — Encounter: Payer: Self-pay | Admitting: *Deleted

## 2020-09-05 ENCOUNTER — Other Ambulatory Visit (HOSPITAL_COMMUNITY): Payer: Self-pay

## 2020-09-06 ENCOUNTER — Ambulatory Visit: Payer: Medicaid Other | Admitting: Clinical

## 2020-09-06 DIAGNOSIS — Z5329 Procedure and treatment not carried out because of patient's decision for other reasons: Secondary | ICD-10-CM

## 2020-09-06 DIAGNOSIS — Z91199 Patient's noncompliance with other medical treatment and regimen due to unspecified reason: Secondary | ICD-10-CM

## 2020-09-07 ENCOUNTER — Other Ambulatory Visit (HOSPITAL_COMMUNITY): Payer: Self-pay

## 2020-09-20 ENCOUNTER — Encounter: Payer: Self-pay | Admitting: Emergency Medicine

## 2020-09-20 ENCOUNTER — Other Ambulatory Visit: Payer: Self-pay

## 2020-09-20 ENCOUNTER — Emergency Department
Admission: EM | Admit: 2020-09-20 | Discharge: 2020-09-21 | Disposition: A | Discharge status: Home / Self Care | Payer: Medicaid Other | Attending: Emergency Medicine | Admitting: Emergency Medicine

## 2020-09-20 DIAGNOSIS — F17210 Nicotine dependence, cigarettes, uncomplicated: Secondary | ICD-10-CM | POA: Diagnosis not present

## 2020-09-20 DIAGNOSIS — F1120 Opioid dependence, uncomplicated: Secondary | ICD-10-CM | POA: Diagnosis present

## 2020-09-20 DIAGNOSIS — T424X4A Poisoning by benzodiazepines, undetermined, initial encounter: Secondary | ICD-10-CM | POA: Insufficient documentation

## 2020-09-20 DIAGNOSIS — F1121 Opioid dependence, in remission: Secondary | ICD-10-CM | POA: Diagnosis not present

## 2020-09-20 DIAGNOSIS — Z046 Encounter for general psychiatric examination, requested by authority: Secondary | ICD-10-CM | POA: Diagnosis not present

## 2020-09-20 LAB — COMPREHENSIVE METABOLIC PANEL
ALT: 11 U/L (ref 0–44)
AST: 22 U/L (ref 15–41)
Albumin: 3.8 g/dL (ref 3.5–5.0)
Alkaline Phosphatase: 143 U/L — ABNORMAL HIGH (ref 38–126)
Anion gap: 8 (ref 5–15)
BUN: 12 mg/dL (ref 6–20)
CO2: 25 mmol/L (ref 22–32)
Calcium: 9 mg/dL (ref 8.9–10.3)
Chloride: 105 mmol/L (ref 98–111)
Creatinine, Ser: 0.71 mg/dL (ref 0.44–1.00)
GFR, Estimated: >60 mL/min (ref >60)
Glucose, Bld: 93 mg/dL (ref 70–99)
Potassium: 4.2 mmol/L (ref 3.5–5.1)
Sodium: 138 mmol/L (ref 135–145)
Total Bilirubin: 0.5 mg/dL (ref 0.3–1.2)
Total Protein: 6.5 g/dL (ref 6.5–8.1)

## 2020-09-20 LAB — CBC
HCT: 33.4 % — ABNORMAL LOW (ref 36.0–46.0)
Hemoglobin: 10.7 g/dL — ABNORMAL LOW (ref 12.0–15.0)
MCH: 26.8 pg (ref 26.0–34.0)
MCHC: 32 g/dL (ref 30.0–36.0)
MCV: 83.7 fL (ref 80.0–100.0)
Platelets: 246 10*3/uL (ref 150–400)
RBC: 3.99 MIL/uL (ref 3.87–5.11)
RDW: 13.1 % (ref 11.5–15.5)
WBC: 3.8 10*3/uL — ABNORMAL LOW (ref 4.0–10.5)
nRBC: 0 % (ref 0.0–0.2)

## 2020-09-20 LAB — ETHANOL: Alcohol, Ethyl (B): <10 mg/dL (ref <10)

## 2020-09-20 MED ORDER — SODIUM CHLORIDE 0.9 % IV BOLUS
1000.0000 mL | Freq: Once | INTRAVENOUS | Status: AC
  Administered 2020-09-20: 1000 mL via INTRAVENOUS

## 2020-09-20 NOTE — ED Triage Notes (Addendum)
Pt via [employer] under IVC, per [employer] officers were looking to different apartments for drugs. Pt was on the bed and very lethargic on the bed when they arrived. There was a 3 week newborn in the apartment. Officers left, then the janitor came to the officer before they left and pt's baby was in the janitors hand and she asked them to come back to the apartment because she thought something was wrong. Pt was passed out on the bed and boyfriend admitted to Xanax use. Pt is very lethargic on arrival. Pt states she took only 2 Xanax. Denies ETOH or any other drug use. Pt is alert but lethargic. Denies SI/HI.

## 2020-09-20 NOTE — ED Provider Notes (Signed)
[facility] Emergency Department Provider Note   ____________________________________________   Event Date/Time   First MD Initiated Contact with Patient 09/20/20 1811     (approximate)  I have reviewed the triage vital signs and the nursing notes.   HISTORY  Chief Complaint Psychiatric Evaluation    HPI [name] is a 25 y.o. female with past medical history of generalized anxiety disorder, depression, and polysubstance abuse who presents to the ED for psychiatric evaluation.  History is limited as patient appears intoxicated, admits to taking Xanax earlier this evening.  Per IVC paperwork, she was found passed out in a hotel room with her 75-month-old child also in the room.  Staff at the hotel was concerned that she was not caring for the child.  Patient denies attempting to harm herself, denies suicidal or homicidal ideation.  She states she is sleepy from caring for her child, denies any complaints.        Past Medical History:  Diagnosis Date   ADHD (attention deficit hyperactivity disorder)    Anesthesia complication    woke up fighting after tonsillectomy   Anxiety    Cholecystitis    Complication of anesthesia    when wakes up " freaks out- like panic attack"   Depression    Dysmenorrhea    Endometriosis    GERD (gastroesophageal reflux disease)    Headache    migraines   Hyperlipidemia    Hypoglycemia    Polysubstance abuse (HCC)    Pregnancy complicated by subutex maintenance, antepartum (HCC)    Syncope    Viral warts    hand   Vision abnormalities    wears glasses    Patient Active Problem List   Diagnosis Date Noted   Polyhydramnios affecting pregnancy 08/26/2020   Polyhydramnios 08/22/2020   Anemia affecting pregnancy in third trimester 07/12/2020   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   Tobacco use disorder 05/29/2020   Chlamydia infection affecting pregnancy 02/16/2020   Supervision of normal first pregnancy,  antepartum 02/14/2020   Substance abuse affecting pregnancy, antepartum 02/14/2020   Opioid use disorder, severe, in early remission (HCC) 01/10/2020   MDD (major depressive disorder), recurrent severe, 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    Past Surgical History:  Procedure Laterality Date   ADENOIDECTOMY     CHOLECYSTECTOMY  03/21/2011   Procedure: LAPAROSCOPIC CHOLECYSTECTOMY;  Surgeon: Shelly Rubenstein, MD;  Location: [facility] OR;  Service: General;  Laterality: N/A;   ESOPHAGOGASTRODUODENOSCOPY  09/26/2011   Procedure: ESOPHAGOGASTRODUODENOSCOPY (EGD);  Surgeon: Jon Gills, MD;  Location: [facility] OR;  Service: Gastroenterology;  Laterality: N/A;   TONSILLECTOMY AND ADENOIDECTOMY  06/2005   WISDOM TOOTH EXTRACTION      Prior to Admission medications   Medication Sig Start Date End Date Taking? Authorizing Provider  acetaminophen (TYLENOL) 325 MG tablet Take 2 tablets (650 mg total) by mouth every 4 (four) hours as needed (for pain scale < 4). 08/30/20  Yes Maxwell, Allee, MD  benzocaine-Menthol (DERMOPLAST) 20-0.5 % AERO Apply 1 application topically as needed for irritation (perineal discomfort). 08/30/20  Yes Alfredo Martinez, MD  buprenorphine (SUBUTEX) 8 MG SUBL SL tablet Place 1 tablet (8 mg total) under the tongue in the morning, at noon, and at bedtime. 08/17/20  Yes Reymundo Poll, MD  coconut oil OIL Apply 1 application topically as needed. 08/30/20  Yes Alfredo Martinez, MD  ferrous sulfate 325 (65 FE) MG tablet Take 1 tablet (325 mg total)  by mouth every other day. 08/30/20  Yes Alfredo Martinez, MD  gabapentin (NEURONTIN) 300 MG capsule Take 1 capsule (300 mg total) by mouth 3 (three) times daily. 07/11/20  Yes Venora Maples, MD  ibuprofen (ADVIL) 600 MG tablet Take 1 tablet (600 mg total) by mouth every 6 (six) hours. 08/30/20  Yes Alfredo Martinez, MD  norethindrone (ORTHO MICRONOR) 0.35 MG tablet Take 1 tablet (0.35 mg total) by mouth daily.  09/15/20  Yes Alfredo Martinez, MD  Prenatal Vit-Fe Fumarate-FA (PRENATAL MULTIVITAMIN) TABS tablet Take 1 tablet by mouth daily at 12 noon. 08/30/20  Yes Maxwell, Allee, MD  promethazine (PHENERGAN) 25 MG tablet TAKE 1 TABLET BY MOUTH TWICE DAILY AS NEEDED FOR NAUSEA. IF NOT TOLERATED BY MOUTH, YOU CAN PLACE THEM IN THE VAGINA OR RECTUM INSTEAD. 03/13/20 03/13/21 Yes Venora Maples, MD  omeprazole (PRILOSEC) 20 MG capsule Take 1 capsule (20 mg total) by mouth daily. Patient not taking: Reported on 09/20/2020 07/11/20   Venora Maples, MD    Allergies Blueberry fruit extract, Contrast media [iodinated diagnostic agents], Codeine, and Omnipaque [iohexol]  Family History  Problem Relation Age of Onset   Anesthesia problems Maternal Grandfather    Heart disease Maternal Grandfather    Nephrolithiasis Maternal Grandfather    Diabetes Maternal Grandfather    Mental illness Maternal Grandfather    Cholelithiasis Mother    Nephrolithiasis Mother    Depression Mother    Hypertension Mother    Miscarriages / Stillbirths Mother    Anxiety disorder Mother    Gout Father    Nephrolithiasis Maternal Grandmother    COPD Maternal Grandmother    Heart disease Paternal Grandfather    Cholelithiasis Maternal Aunt    Depression Maternal Aunt    Learning disabilities Maternal Aunt    Bipolar disorder Sister     Social History Social History   Tobacco Use   Smoking status: Every Day    Packs/day: 1.00    Years: 4.00    Pack years: 4.00    Types: Cigarettes   Smokeless tobacco: Never   Tobacco comments:    Quit in June  Vaping Use   Vaping Use: Never used  Substance Use Topics   Alcohol use: Not Currently    Comment: socially   Drug use: Not Currently    Types: Marijuana, Heroin, Benzodiazepines    Comment: last used 11/2019     Review of Systems  Constitutional: No fever/chills.  Positive for fatigue. Eyes: No visual changes. ENT: No sore throat. Cardiovascular: Denies chest  pain. Respiratory: Denies shortness of breath. Gastrointestinal: No abdominal pain.  No nausea, no vomiting.  No diarrhea.  No constipation. Genitourinary: Negative for dysuria. Musculoskeletal: Negative for back pain. Skin: Negative for rash. Neurological: Negative for headaches, focal weakness or numbness.  ____________________________________________   PHYSICAL EXAM:  VITAL SIGNS: ED Triage Vitals  Enc Vitals Group     BP 09/20/20 1637 (!) 97/59     Pulse Rate 09/20/20 1637 88     Resp 09/20/20 1637 20     Temp 09/20/20 1637 98.7 F (37.1 C)     Temp Source 09/20/20 1637 Oral     SpO2 09/20/20 1637 99 %     Weight 09/20/20 1633 160 lb (72.6 kg)     Height 09/20/20 1633 5\' 3"  (1.6 m)     Head Circumference --      Peak Flow --      Pain Score 09/20/20 1633 0  Pain Loc --      Pain Edu? --      Excl. in GC? --     Constitutional: Somnolent, initially arousable only to sternal rub, now awakens to voice. Eyes: Conjunctivae are normal.  Pupils equal, round, and reactive to light bilaterally. Head: Atraumatic. Nose: No congestion/rhinnorhea. Mouth/Throat: Mucous membranes are moist. Neck: Normal ROM Cardiovascular: Normal rate, regular rhythm. Grossly normal heart sounds.  2+ radial pulses bilaterally. Respiratory: Normal respiratory effort.  No retractions. Lungs CTAB. Gastrointestinal: Soft and nontender. No distention. Genitourinary: deferred Musculoskeletal: No lower extremity tenderness nor edema. Neurologic:  Normal speech and language. No gross focal neurologic deficits are appreciated. Skin:  Skin is warm, dry and intact. No rash noted. Psychiatric: Mood and affect are normal. Speech and behavior are normal.  ____________________________________________   LABS (all labs ordered are listed, but only abnormal results are displayed)  Labs Reviewed  COMPREHENSIVE METABOLIC PANEL - Abnormal; Notable for the following components:      Result Value   Alkaline  Phosphatase 143 (*)    All other components within normal limits  CBC - Abnormal; Notable for the following components:   WBC 3.8 (*)    Hemoglobin 10.7 (*)    HCT 33.4 (*)    All other components within normal limits  ETHANOL  URINE DRUG SCREEN, QUALITATIVE ([facility] ONLY)  POC URINE PREG, ED     PROCEDURES  Procedure(s) performed (including Critical Care):  Procedures   ____________________________________________   INITIAL IMPRESSION / ASSESSMENT AND PLAN / ED COURSE      25 year old female with past medical history of anxiety, depression, and polysubstance abuse presents to the ED for psychiatric evaluation after being found passed out in a hotel room with her 34-month-old child nearby.  Patient initially quite somnolent, now arousable to voice and admits to taking Xanax.  She denies suicidal ideation, however we will have psychiatry evaluate to better and determine intent.  She denies medical complaints at this time and labs are unremarkable.  We will observe until she is clinically sober, so that psychiatry may better evaluate.      ____________________________________________   FINAL CLINICAL IMPRESSION(S) / ED DIAGNOSES  Final diagnoses:  Benzodiazepine overdose of undetermined intent, initial encounter     ED Discharge Orders     None        Note:  This document was prepared using Dragon voice recognition software and may include unintentional dictation errors.    Chesley Noon, MD 09/20/20 2351

## 2020-09-20 NOTE — ED Notes (Signed)
Pt is lethargic but follows commands, moans when spoken to. Pt placed on monitor by this nurse.

## 2020-09-20 NOTE — ED Notes (Signed)
Dr. Larinda Buttery states that no further need for fluids. And to monitor BP with goal of MAP 65 or greater

## 2020-09-20 NOTE — BH Assessment (Signed)
This Clinical research associate attempted to assess patient with Psyc NP but patient was currently unable to participate in assessment, will attempt at a later time.

## 2020-09-20 NOTE — ED Notes (Signed)
IVC PENDING  CONSULT ?

## 2020-09-20 NOTE — ED Notes (Signed)
Pt belongings include;   1 silver bracelet  1 gold belly button ring 1 set of gold earrings 2 gold braclets  1 pair of gray crocs 1 tan tank top 1 gray bra 1 gray and black hair tie  1 gray shorts  1 pair of green underwear 1 blue and red purse

## 2020-09-20 NOTE — ED Notes (Signed)
Report received from [place], English as a second language teacher. Patient lethargic but follows commands. Patient made aware of Q15 minute rounds and Psychologist, counselling presence for their safety. Patient instructed to come to this nurse with needs or concerns.

## 2020-09-21 ENCOUNTER — Other Ambulatory Visit (HOSPITAL_COMMUNITY): Payer: Self-pay

## 2020-09-21 ENCOUNTER — Other Ambulatory Visit: Payer: Self-pay | Admitting: Internal Medicine

## 2020-09-21 DIAGNOSIS — F1121 Opioid dependence, in remission: Secondary | ICD-10-CM

## 2020-09-21 DIAGNOSIS — F1120 Opioid dependence, uncomplicated: Secondary | ICD-10-CM

## 2020-09-21 MED ORDER — GABAPENTIN 300 MG PO CAPS
300.0000 mg | ORAL_CAPSULE | Freq: Three times a day (TID) | ORAL | Status: DC
  Administered 2020-09-21: 300 mg via ORAL
  Filled 2020-09-21: qty 1

## 2020-09-21 MED ORDER — BUPRENORPHINE HCL-NALOXONE HCL 8-2 MG SL SUBL
1.0000 | SUBLINGUAL_TABLET | Freq: Three times a day (TID) | SUBLINGUAL | Status: DC
  Administered 2020-09-21: 1 via SUBLINGUAL
  Filled 2020-09-21: qty 1

## 2020-09-21 NOTE — ED Notes (Signed)
Pt declined breakfast tray at this time.  

## 2020-09-21 NOTE — ED Notes (Signed)
Dr. York Cerise states no further interventions at this time

## 2020-09-21 NOTE — ED Provider Notes (Signed)
Cleared for discharge by Dr. Luz Lex, MD 09/21/20 1038

## 2020-09-21 NOTE — ED Notes (Signed)
Psychologist, Dr. Toni Amend, at bedside with patient.

## 2020-09-21 NOTE — ED Notes (Signed)
Pt woken up by this nurse and Georgiann Hahn, Tech at this time. Vitals obtained again, all WNL, per Dr. York Cerise pt is safe to be removed from cardiac monitoring. Garage door is closed at this time in room and no wires left in pt reach. Pt does still have 2 rings on left hand but these are unable to be taken off, will remain and reported to next shift nurse at shift change. Pt is able to converse more now, denies any drug use tonight. States she is sleepy due to having a new born at home. Pt states she took a half a xanax when asked about this. Pt denies all else and is dismissive of conversation. Requests subutex and gabapentin, Dr. York Cerise states none to be given to patient.

## 2020-09-21 NOTE — ED Provider Notes (Signed)
Emergency Medicine Observation Re-evaluation Note  [name] is a 25 y.o. female, seen on rounds today.  Pt initially presented to the ED for complaints of No chief complaint on file. Currently, the patient is resting.  Physical Exam  BP 101/65   Pulse 64   Temp 98 F (36.7 C) (Oral)   Resp 18   Ht 1.6 m (5\' 3" )   Wt 72.6 kg   LMP 11/26/2019   SpO2 98%   Breastfeeding No   BMI 28.34 kg/m  Physical Exam  Gen:  No acute distress Resp:  Breathing easily and comfortably, no accessory muscle usage Neuro:  Moving all four extremities, no gross focal neuro deficits Psych:  Resting currently, calm when awake  ED Course / MDM  EKG:   I have reviewed the labs performed to date as well as medications administered while in observation.  Recent changes in the last 24 hours include initial evaluation.  Plan  Current plan is for psych disposition. [name] is under involuntary commitment.      [name], MD 09/21/20 [phone]

## 2020-09-21 NOTE — ED Notes (Signed)
Pt mother speaks to this nurse at this time, this provided HIPPA appropriate information to patient mother. Pt mother expressed concern for patient due to worsening symptoms of depression and worsening mood since pregnancy and after child birth 3 weeks ago. Pt mother then reports that pt expresses desire to self harm and poor emotional state.

## 2020-09-21 NOTE — ED Notes (Signed)
Dr. York Cerise notified that BP and MAP are low at this time. States to assess pt and have her wake up and recheck vitals.

## 2020-09-21 NOTE — ED Notes (Signed)
IVC/pending psych consult 

## 2020-09-21 NOTE — ED Notes (Signed)
Pt woken up at this time to assess mental status and readjust blood pressure cuff. PT cooperative and vocal when woken up. Oriented to place, purpose, requests time of night and expresses understanding. Pt remains in bed at this time
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
## 2020-09-21 NOTE — ED Notes (Signed)
Pt ambulates on own to restroom while staff was with other patient. No urine was able to be collected. Will attempt to collect urine again when pt goes to restroom

## 2020-09-24 ENCOUNTER — Other Ambulatory Visit (HOSPITAL_COMMUNITY): Payer: Self-pay

## 2020-09-24 ENCOUNTER — Ambulatory Visit (INDEPENDENT_AMBULATORY_CARE_PROVIDER_SITE_OTHER): Payer: Medicaid Other | Admitting: Student

## 2020-09-24 ENCOUNTER — Other Ambulatory Visit: Payer: Self-pay

## 2020-09-24 ENCOUNTER — Encounter: Payer: Self-pay | Admitting: Student

## 2020-09-24 ENCOUNTER — Other Ambulatory Visit: Payer: Self-pay | Admitting: Internal Medicine

## 2020-09-24 VITALS — BP 127/64 | HR 88 | Temp 98.6°F | Ht 63.0 in | Wt 195.6 lb

## 2020-09-24 DIAGNOSIS — F1121 Opioid dependence, in remission: Secondary | ICD-10-CM | POA: Diagnosis present

## 2020-09-24 DIAGNOSIS — F1120 Opioid dependence, uncomplicated: Secondary | ICD-10-CM

## 2020-09-24 DIAGNOSIS — F411 Generalized anxiety disorder: Secondary | ICD-10-CM

## 2020-09-24 MED ORDER — BUPRENORPHINE HCL 8 MG SL SUBL
8.0000 mg | SUBLINGUAL_TABLET | Freq: Three times a day (TID) | SUBLINGUAL | 0 refills | Status: DC
  Filled 2020-09-24: qty 90, 30d supply, fill #0

## 2020-09-24 NOTE — Patient Instructions (Addendum)
[name],  It was a pleasure seeing you in the clinic today.  Here is a summary what we talked about:  1.  Depression/anxiety: Please go to the [facility] walk-in clinic.  2.  Opioid use disorder: We will send a refill of your Subutex to the pharmacy.   Please return in 4 weeks for follow-up,  Take care  Dr. Cyndie Chime

## 2020-09-24 NOTE — Assessment & Plan Note (Addendum)
Patient is here for follow-up on her ED.  Last visit was 07/26/2020.  Her mother accompanies her to the visit.  Patient reports doing well on Subutex.  Denies craving or usage of opioid products or other substances.  I offered to switch Subutex back to Suboxone for better safety profile.  Patient request to continue Subutex because it works better.  Current dose: Subutex 8 mg TID PDMP checked.  Last refill 7/29. Last U tox was in May.  Patient recently had an ED visit in August due to benzodiazepine use.  -Refill Subutex -U tox today -Follow-up in 4 weeks
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
## 2020-09-24 NOTE — Telephone Encounter (Signed)
Pt has an appt today w/Dr Cyndie Chime.

## 2020-09-25 ENCOUNTER — Other Ambulatory Visit (HOSPITAL_COMMUNITY): Payer: Self-pay

## 2020-09-28 LAB — TOXASSURE SELECT,+ANTIDEPR,UR

## 2020-10-02 ENCOUNTER — Ambulatory Visit: Payer: Medicaid Other | Admitting: Nurse Practitioner

## 2020-10-02 ENCOUNTER — Other Ambulatory Visit: Payer: Medicaid Other

## 2020-10-02 ENCOUNTER — Other Ambulatory Visit: Payer: Self-pay | Admitting: General Practice

## 2020-10-02 DIAGNOSIS — Z8632 Personal history of gestational diabetes: Secondary | ICD-10-CM

## 2020-10-08 ENCOUNTER — Other Ambulatory Visit (HOSPITAL_COMMUNITY): Payer: Self-pay

## 2020-10-08 ENCOUNTER — Other Ambulatory Visit: Payer: Self-pay | Admitting: Internal Medicine

## 2020-10-08 DIAGNOSIS — F1120 Opioid dependence, uncomplicated: Secondary | ICD-10-CM

## 2020-10-09 ENCOUNTER — Other Ambulatory Visit (HOSPITAL_COMMUNITY): Payer: Self-pay

## 2020-10-09 MED ORDER — BUPRENORPHINE HCL 8 MG SL SUBL
8.0000 mg | SUBLINGUAL_TABLET | Freq: Three times a day (TID) | SUBLINGUAL | 0 refills | Status: DC
Start: 2020-10-09 — End: 2020-11-28
  Filled 2020-10-09 – 2020-10-26 (×2): qty 45, 15d supply, fill #0

## 2020-10-09 NOTE — Telephone Encounter (Signed)
2 week follow up needed, OUD.

## 2020-10-10 ENCOUNTER — Other Ambulatory Visit (HOSPITAL_COMMUNITY): Payer: Self-pay

## 2020-10-11 NOTE — Progress Notes (Signed)
Internal Medicine Clinic Attending  Case discussed with Dr. Nguyen  At the time of the visit.  We reviewed the resident's history and exam and pertinent patient test results.  I agree with the assessment, diagnosis, and plan of care documented in the resident's note. 

## 2020-10-15 ENCOUNTER — Other Ambulatory Visit (HOSPITAL_COMMUNITY): Payer: Self-pay

## 2020-10-15 ENCOUNTER — Other Ambulatory Visit: Payer: Self-pay | Admitting: Student in an Organized Health Care Education/Training Program

## 2020-10-16 ENCOUNTER — Other Ambulatory Visit (HOSPITAL_COMMUNITY): Payer: Self-pay

## 2020-10-16 NOTE — Telephone Encounter (Signed)
Refill request for phenergan declined. Looks like this is an old prescription that was written by an outside provider. She needs to be seen or at least have a telehealth appointment. She also needs to schedule OUD follow up. Thanks!

## 2020-10-22 ENCOUNTER — Other Ambulatory Visit: Payer: Self-pay | Admitting: Family Medicine

## 2020-10-22 ENCOUNTER — Other Ambulatory Visit (HOSPITAL_COMMUNITY): Payer: Self-pay

## 2020-10-22 DIAGNOSIS — F411 Generalized anxiety disorder: Secondary | ICD-10-CM

## 2020-10-23 ENCOUNTER — Other Ambulatory Visit (HOSPITAL_COMMUNITY): Payer: Self-pay

## 2020-10-23 ENCOUNTER — Other Ambulatory Visit: Payer: Self-pay | Admitting: Internal Medicine

## 2020-10-23 DIAGNOSIS — F411 Generalized anxiety disorder: Secondary | ICD-10-CM

## 2020-10-24 ENCOUNTER — Other Ambulatory Visit (HOSPITAL_COMMUNITY): Payer: Self-pay

## 2020-10-24 ENCOUNTER — Telehealth: Payer: Self-pay

## 2020-10-24 NOTE — Telephone Encounter (Signed)
Pt is requesting a call back .. she is wanting her dr to write her a note for court for her OUD treatment

## 2020-10-24 NOTE — Telephone Encounter (Signed)
This has never been filled by [place].  Needs to go to previous prescriber.

## 2020-10-25 ENCOUNTER — Encounter: Payer: Self-pay | Admitting: Student in an Organized Health Care Education/Training Program

## 2020-10-25 NOTE — Telephone Encounter (Signed)
Placed call to patient. Mom answered and states patient has been in jail since 9/23. Has court appearance tomorrow at 0830. Mom states letter needs to state the following:  The date patient first started treatment with suboxone at our clinic  Any missed appointments  All meds  That she was referred to [facility] for Psych eval.  Mom will need to pick up letter today.

## 2020-10-25 NOTE — Telephone Encounter (Signed)
I am happy to help, but I would like to hear from the patient herself about this before we release medical information about her. Can we call [name] and ask if she wants [place] to provide her with a letter about her medical condition, and ask specifically what she wants it to say, such as are there specific date ranges that it needs to cover? Should it just say that she is engaged in treatment for OUD?

## 2020-10-25 NOTE — Telephone Encounter (Signed)
Pt's mother requesting to speak with a nurse about getting a letter for her daughter. Please call back.

## 2020-10-25 NOTE — Telephone Encounter (Signed)
Patient's mother notified that letter is ready for p/u by 4:30 today.

## 2020-10-26 ENCOUNTER — Other Ambulatory Visit (HOSPITAL_COMMUNITY): Payer: Self-pay

## 2020-11-05 ENCOUNTER — Other Ambulatory Visit (HOSPITAL_COMMUNITY): Payer: Self-pay

## 2020-11-05 ENCOUNTER — Other Ambulatory Visit: Payer: Self-pay | Admitting: Internal Medicine

## 2020-11-05 DIAGNOSIS — F411 Generalized anxiety disorder: Secondary | ICD-10-CM

## 2020-11-06 ENCOUNTER — Other Ambulatory Visit (HOSPITAL_COMMUNITY): Payer: Self-pay

## 2020-11-06 ENCOUNTER — Other Ambulatory Visit: Payer: Self-pay | Admitting: Internal Medicine

## 2020-11-06 ENCOUNTER — Other Ambulatory Visit: Payer: Self-pay | Admitting: *Deleted

## 2020-11-06 DIAGNOSIS — F411 Generalized anxiety disorder: Secondary | ICD-10-CM

## 2020-11-06 MED ORDER — GABAPENTIN 300 MG PO CAPS
300.0000 mg | ORAL_CAPSULE | Freq: Three times a day (TID) | ORAL | 2 refills | Status: DC
  Filled 2020-11-06: qty 90, 30d supply, fill #0

## 2020-11-06 NOTE — Telephone Encounter (Signed)
Spoke with patient's mother. Patient is still incarcerated. States she is taking the suboxone to patient today. She would like to take the gabapentin to her also. Unfortunately, the Provider who has prescribed this for her in the past has left the practice. Mom is asking if [facility] Provider would be willing to refill.

## 2020-11-06 NOTE — Telephone Encounter (Signed)
Patient is not a PCP patient in this clinic.  Needs to call previous prescriber

## 2020-11-28 ENCOUNTER — Other Ambulatory Visit: Payer: Self-pay | Admitting: Internal Medicine

## 2020-11-28 ENCOUNTER — Other Ambulatory Visit (HOSPITAL_COMMUNITY): Payer: Self-pay

## 2020-11-28 DIAGNOSIS — F1120 Opioid dependence, uncomplicated: Secondary | ICD-10-CM

## 2020-11-29 ENCOUNTER — Other Ambulatory Visit (HOSPITAL_COMMUNITY): Payer: Self-pay

## 2020-11-29 ENCOUNTER — Ambulatory Visit (HOSPITAL_COMMUNITY)
Admission: RE | Admit: 2020-11-29 | Discharge: 2020-11-29 | Disposition: A | Discharge status: Home / Self Care | Payer: Medicaid Other | Attending: Psychiatry | Admitting: Psychiatry

## 2020-11-29 MED ORDER — BUPRENORPHINE HCL 8 MG SL SUBL
8.0000 mg | SUBLINGUAL_TABLET | Freq: Three times a day (TID) | SUBLINGUAL | 0 refills | Status: DC
  Filled 2020-11-29: qty 45, 15d supply, fill #0

## 2020-11-29 NOTE — Telephone Encounter (Signed)
Ok, I will refill in case family can bring this medicine to her. I hope we can get her a follow up for after she is released.

## 2020-11-30 ENCOUNTER — Ambulatory Visit (HOSPITAL_COMMUNITY): Admission: AD | Admit: 2020-11-30 | Payer: Medicaid Other | Source: Home / Self Care | Admitting: Emergency Medicine

## 2020-11-30 NOTE — BH Assessment (Addendum)
Received notice from the [facility] Selena Batten, RN) that patient presented to [facility] as a walk-in, requesting a TTS assessment. The [facility] provided patient with the MSE, which was signed.   The [facility] later stated that when patient was called back for her TTS assessment, she was not in the lobby. [facility] AC attempted to contact patient using the number listed on her walk-in form as a wellness check [phone]. However, patient did not answer the phone.   The [facility] provider Melbourne Abts, [place]) is aware of the above information.

## 2020-12-04 ENCOUNTER — Telehealth: Payer: Self-pay

## 2020-12-04 NOTE — Telephone Encounter (Signed)
PA for pt ( [facility] ) came through VIA fax  12/03/20 was done and sent back to [facility] Track awaiting approval or denial

## 2020-12-24 ENCOUNTER — Emergency Department (HOSPITAL_COMMUNITY)
Admission: EM | Admit: 2020-12-24 | Discharge: 2020-12-25 | Disposition: A | Discharge status: Home / Self Care | Payer: Medicaid Other | Attending: Student | Admitting: Student

## 2020-12-24 ENCOUNTER — Emergency Department (HOSPITAL_COMMUNITY): Payer: Medicaid Other

## 2020-12-24 ENCOUNTER — Other Ambulatory Visit: Payer: Self-pay

## 2020-12-24 ENCOUNTER — Encounter (HOSPITAL_COMMUNITY): Payer: Self-pay | Admitting: Emergency Medicine

## 2020-12-24 DIAGNOSIS — R0781 Pleurodynia: Secondary | ICD-10-CM | POA: Insufficient documentation

## 2020-12-24 DIAGNOSIS — Z5321 Procedure and treatment not carried out due to patient leaving prior to being seen by health care provider: Secondary | ICD-10-CM | POA: Insufficient documentation

## 2020-12-24 DIAGNOSIS — H532 Diplopia: Secondary | ICD-10-CM | POA: Diagnosis present

## 2020-12-24 MED ORDER — LORAZEPAM 2 MG/ML IJ SOLN
1.0000 mg | Freq: Once | INTRAMUSCULAR | Status: AC
  Administered 2020-12-24: 22:00:00 1 mg via INTRAMUSCULAR
  Filled 2020-12-24: qty 1

## 2020-12-24 NOTE — ED Notes (Signed)
Refused vitals 
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
## 2020-12-25 ENCOUNTER — Ambulatory Visit (INDEPENDENT_AMBULATORY_CARE_PROVIDER_SITE_OTHER): Payer: Medicaid Other | Admitting: Student

## 2020-12-25 ENCOUNTER — Other Ambulatory Visit (HOSPITAL_COMMUNITY): Payer: Self-pay

## 2020-12-25 ENCOUNTER — Other Ambulatory Visit: Payer: Self-pay

## 2020-12-25 VITALS — BP 101/54 | HR 83 | Temp 98.0°F | Wt 169.1 lb

## 2020-12-25 DIAGNOSIS — G4700 Insomnia, unspecified: Secondary | ICD-10-CM

## 2020-12-25 DIAGNOSIS — F411 Generalized anxiety disorder: Secondary | ICD-10-CM

## 2020-12-25 DIAGNOSIS — H532 Diplopia: Secondary | ICD-10-CM

## 2020-12-25 DIAGNOSIS — R110 Nausea: Secondary | ICD-10-CM | POA: Diagnosis not present

## 2020-12-25 DIAGNOSIS — F1120 Opioid dependence, uncomplicated: Secondary | ICD-10-CM | POA: Diagnosis not present

## 2020-12-25 DIAGNOSIS — F1121 Opioid dependence, in remission: Secondary | ICD-10-CM | POA: Diagnosis not present

## 2020-12-25 MED ORDER — BUPRENORPHINE HCL-NALOXONE HCL 8-2 MG SL SUBL
1.0000 | SUBLINGUAL_TABLET | Freq: Two times a day (BID) | SUBLINGUAL | 0 refills | Status: DC
  Filled 2020-12-25 – 2021-01-10 (×7): qty 28, 14d supply, fill #0

## 2020-12-25 MED ORDER — GABAPENTIN 300 MG PO CAPS
300.0000 mg | ORAL_CAPSULE | Freq: Three times a day (TID) | ORAL | 2 refills | Status: DC
Start: 2020-12-25 — End: 2021-03-26
  Filled 2020-12-25: qty 90, 30d supply, fill #0
  Filled 2021-01-22: qty 90, 30d supply, fill #1
  Filled 2021-02-25 – 2021-02-28 (×2): qty 90, 30d supply, fill #2

## 2020-12-25 MED ORDER — TRAZODONE HCL 100 MG PO TABS
100.0000 mg | ORAL_TABLET | Freq: Every evening | ORAL | 0 refills | Status: DC | PRN
  Filled 2020-12-25: qty 30, 30d supply, fill #0

## 2020-12-25 MED ORDER — PROMETHAZINE HCL 25 MG PO TABS
25.0000 mg | ORAL_TABLET | Freq: Three times a day (TID) | ORAL | 0 refills | Status: DC | PRN
  Filled 2020-12-25: qty 15, 5d supply, fill #0

## 2020-12-25 NOTE — ED Notes (Signed)
Pts mother states that pt is leaving, pt and mother seen leaving ED entrance.

## 2020-12-25 NOTE — Patient Instructions (Signed)
[name], it was a pleasure seeing you today!  Today we discussed: - Suboxone: We are going to start you out on Suboxone twice daily. Please come back in two weeks so we can see how you are doing.  - I have prescribed trazodone for sleep and sent in refills for Phenergan and gabapentin.  - The referral for eye doctor has also been submitted. You will receive a call to schedule.  I have ordered the following medication/changed the following medications:   Start the following medications: Meds ordered this encounter  Medications   buprenorphine-naloxone (SUBOXONE) 8-2 mg SUBL SL tablet    Sig: Place 1 tablet under the tongue 2 (two) times daily.    Dispense:  28 tablet    Refill:  0    KYHCWC:[id number]   promethazine (PHENERGAN) 25 MG tablet    Sig: Take 1 tablet (25 mg total) by mouth every 8 (eight) hours as needed for up to 5 days for nausea or vomiting.    Dispense:  15 tablet    Refill:  0   gabapentin (NEURONTIN) 300 MG capsule    Sig: Take 1 capsule (300 mg total) by mouth 3 (three) times daily.    Dispense:  90 capsule    Refill:  2   traZODone (DESYREL) 100 MG tablet    Sig: Take 1 tablet (100 mg total) by mouth at bedtime as needed for sleep.    Dispense:  30 tablet    Refill:  0     Follow-up:  01/08/2021 at 10:15AM    Please make sure to arrive 15 minutes prior to your next appointment. If you arrive late, you may be asked to reschedule.   We look forward to seeing you next time. Please call our clinic at [phone] if you have any questions or concerns. The best time to call is Monday-Friday from 9am-4pm, but there is someone available 24/7. If after hours or the weekend, call the main hospital number and ask for the Internal Medicine Resident On-Call. If you need medication refills, please notify your pharmacy one week in advance and they will send [place] a request.  Thank you for letting us take part in your care. Wishing you the best!  Thank you, Evlyn Kanner, MD
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
## 2020-12-26 ENCOUNTER — Other Ambulatory Visit (HOSPITAL_COMMUNITY): Payer: Self-pay

## 2020-12-27 ENCOUNTER — Other Ambulatory Visit (HOSPITAL_COMMUNITY): Payer: Self-pay
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
## 2020-12-28 NOTE — Progress Notes (Signed)
Internal Medicine Clinic Attending ? ?Case discussed with Dr. Braswell  At the time of the visit.  We reviewed the resident?s history and exam and pertinent patient test results.  I agree with the assessment, diagnosis, and plan of care documented in the resident?s note.  ?

## 2021-01-01 ENCOUNTER — Other Ambulatory Visit (HOSPITAL_COMMUNITY): Payer: Self-pay

## 2021-01-02 ENCOUNTER — Other Ambulatory Visit (HOSPITAL_COMMUNITY): Payer: Self-pay

## 2021-01-02 NOTE — Telephone Encounter (Signed)
If she is no longer pregnant she likely needs to be changed to suboxone.

## 2021-01-02 NOTE — Telephone Encounter (Signed)
Pt mom called stated  that she received  the denial letter from the insurance  stating that pt was denied because she did not meet the  criteria.. I explained that I did not receive the letter so I can only explain what it meant... which pt mom and grand mother in the back ground starts to yell that pt had pregnancy medicare before and the insurance has change to regular medicare now so why now she is not allowed.. I then explained maybe the coverage is different and if she can bring the letter so I can see it she ( MOM ) stated she cant just drive over here she is trying to fix it over the phone .Marland Kitchen I then tried to explain I cant make a false chart to send in the [place] I sent the chart notes that the DR wrote  and that is what the decision is based on .Marland Kitchen PT mom then hang up the phone on me .Marland KitchenMarland Kitchen

## 2021-01-08 ENCOUNTER — Encounter: Payer: Medicaid Other | Admitting: Student

## 2021-01-09 ENCOUNTER — Telehealth: Payer: Self-pay

## 2021-01-09 NOTE — Telephone Encounter (Signed)
A  new PA  came through for pt ( BURENOPHINE HCL-NALOXONE 8-2MG  ) was done and sent back through cover my meds ... PT doesn't not have straight medicare she is enrolled in well care  ID # [id number] p  .. submitted with office notes and UDA .. awaiting approval or denial

## 2021-01-10 ENCOUNTER — Other Ambulatory Visit (HOSPITAL_COMMUNITY): Payer: Self-pay

## 2021-01-10 NOTE — Telephone Encounter (Signed)
DECISION:    Approved today  Approved. This drug has been approved.   Approved quantity: 28 tablets per 30 day(s). You may fill up to a 34 day supply at a retail pharmacy.   You may fill up to a 90 day supply for maintenance drugs, please refer to the formulary for details.  Please call the pharmacy to process your prescription claim.  Drug  Buprenorphine HCl-Naloxone HCl 8-2MG  sublingual tablets  Form [employer] Prior Authorization Request Form [phone] NCPDP)   (COPY SENT TO THE PHARMACY ALSO )

## 2021-01-14 ENCOUNTER — Other Ambulatory Visit (HOSPITAL_COMMUNITY): Payer: Self-pay

## 2021-01-21 ENCOUNTER — Other Ambulatory Visit: Payer: Self-pay

## 2021-01-21 ENCOUNTER — Encounter (HOSPITAL_COMMUNITY): Payer: Self-pay

## 2021-01-21 ENCOUNTER — Emergency Department (HOSPITAL_COMMUNITY)
Admission: EM | Admit: 2021-01-21 | Discharge: 2021-01-21 | Disposition: A | Discharge status: Home / Self Care | Payer: Medicaid Other | Attending: Emergency Medicine | Admitting: Emergency Medicine

## 2021-01-21 ENCOUNTER — Other Ambulatory Visit (HOSPITAL_COMMUNITY): Payer: Self-pay

## 2021-01-21 ENCOUNTER — Emergency Department (HOSPITAL_COMMUNITY): Payer: Medicaid Other

## 2021-01-21 DIAGNOSIS — R1084 Generalized abdominal pain: Secondary | ICD-10-CM | POA: Insufficient documentation

## 2021-01-21 DIAGNOSIS — R112 Nausea with vomiting, unspecified: Secondary | ICD-10-CM | POA: Insufficient documentation

## 2021-01-21 DIAGNOSIS — Z20822 Contact with and (suspected) exposure to covid-19: Secondary | ICD-10-CM | POA: Insufficient documentation

## 2021-01-21 DIAGNOSIS — F17210 Nicotine dependence, cigarettes, uncomplicated: Secondary | ICD-10-CM | POA: Diagnosis not present

## 2021-01-21 DIAGNOSIS — F1113 Opioid abuse with withdrawal: Secondary | ICD-10-CM | POA: Diagnosis not present

## 2021-01-21 DIAGNOSIS — N9489 Other specified conditions associated with female genital organs and menstrual cycle: Secondary | ICD-10-CM | POA: Diagnosis not present

## 2021-01-21 DIAGNOSIS — F1193 Opioid use, unspecified with withdrawal: Secondary | ICD-10-CM

## 2021-01-21 DIAGNOSIS — R109 Unspecified abdominal pain: Secondary | ICD-10-CM | POA: Diagnosis present

## 2021-01-21 LAB — CBC WITH DIFFERENTIAL/PLATELET
Abs Immature Granulocytes: 0.01 10*3/uL (ref 0.00–0.07)
Basophils Absolute: 0 10*3/uL (ref 0.0–0.1)
Basophils Relative: 0 %
Eosinophils Absolute: 0 10*3/uL (ref 0.0–0.5)
Eosinophils Relative: 1 %
HCT: 39.8 % (ref 36.0–46.0)
Hemoglobin: 12.6 g/dL (ref 12.0–15.0)
Immature Granulocytes: 0 %
Lymphocytes Relative: 22 %
Lymphs Abs: 0.9 10*3/uL (ref 0.7–4.0)
MCH: 24.3 pg — ABNORMAL LOW (ref 26.0–34.0)
MCHC: 31.7 g/dL (ref 30.0–36.0)
MCV: 76.7 fL — ABNORMAL LOW (ref 80.0–100.0)
Monocytes Absolute: 0.3 10*3/uL (ref 0.1–1.0)
Monocytes Relative: 7 %
Neutro Abs: 3 10*3/uL (ref 1.7–7.7)
Neutrophils Relative %: 70 %
Platelets: 244 10*3/uL (ref 150–400)
RBC: 5.19 MIL/uL — ABNORMAL HIGH (ref 3.87–5.11)
RDW: 16 % — ABNORMAL HIGH (ref 11.5–15.5)
WBC: 4.3 10*3/uL (ref 4.0–10.5)
nRBC: 0 % (ref 0.0–0.2)

## 2021-01-21 LAB — URINALYSIS, ROUTINE W REFLEX MICROSCOPIC
Bacteria, UA: NONE SEEN
Glucose, UA: NEGATIVE mg/dL
Ketones, ur: 20 mg/dL — AB
Leukocytes,Ua: NEGATIVE
Nitrite: NEGATIVE
Protein, ur: 30 mg/dL — AB
RBC / HPF: >50 RBC/hpf — ABNORMAL HIGH (ref 0–5)
Specific Gravity, Urine: 1.03 (ref 1.005–1.030)
pH: 6 (ref 5.0–8.0)

## 2021-01-21 LAB — COMPREHENSIVE METABOLIC PANEL
ALT: 14 U/L (ref 0–44)
AST: 22 U/L (ref 15–41)
Albumin: 4.6 g/dL (ref 3.5–5.0)
Alkaline Phosphatase: 66 U/L (ref 38–126)
Anion gap: 10 (ref 5–15)
BUN: 10 mg/dL (ref 6–20)
CO2: 23 mmol/L (ref 22–32)
Calcium: 9.3 mg/dL (ref 8.9–10.3)
Chloride: 103 mmol/L (ref 98–111)
Creatinine, Ser: 0.76 mg/dL (ref 0.44–1.00)
GFR, Estimated: >60 mL/min (ref >60)
Glucose, Bld: 105 mg/dL — ABNORMAL HIGH (ref 70–99)
Potassium: 3.7 mmol/L (ref 3.5–5.1)
Sodium: 136 mmol/L (ref 135–145)
Total Bilirubin: 1.3 mg/dL — ABNORMAL HIGH (ref 0.3–1.2)
Total Protein: 7.3 g/dL (ref 6.5–8.1)

## 2021-01-21 LAB — RESP PANEL BY RT-PCR (FLU A&B, COVID) ARPGX2
Influenza A by PCR: NEGATIVE
Influenza B by PCR: NEGATIVE
SARS Coronavirus 2 by RT PCR: NEGATIVE

## 2021-01-21 LAB — LIPASE, BLOOD: Lipase: 30 U/L (ref 11–51)

## 2021-01-21 LAB — I-STAT BETA HCG BLOOD, ED (MC, WL, AP ONLY): I-stat hCG, quantitative: <5 m[IU]/mL (ref <5)

## 2021-01-21 MED ORDER — DICYCLOMINE HCL 20 MG PO TABS: 20.0000 mg | ORAL_TABLET | Freq: Two times a day (BID) | ORAL | 0 refills | Status: DC

## 2021-01-21 MED ORDER — HYDROXYZINE HCL 25 MG PO TABS: 25.0000 mg | ORAL_TABLET | Freq: Four times a day (QID) | ORAL | 0 refills | Status: DC

## 2021-01-21 MED ORDER — METHOCARBAMOL 500 MG PO TABS: 500.0000 mg | ORAL_TABLET | Freq: Three times a day (TID) | ORAL | Status: DC | PRN

## 2021-01-21 MED ORDER — ONDANSETRON HCL 4 MG/2ML IJ SOLN
4.0000 mg | Freq: Once | INTRAMUSCULAR | Status: AC
  Administered 2021-01-21: 06:00:00 4 mg via INTRAVENOUS
  Filled 2021-01-21: qty 2

## 2021-01-21 MED ORDER — CLONIDINE HCL 0.1 MG PO TABS
0.1000 mg | ORAL_TABLET | Freq: Once | ORAL | 0 refills | Status: DC
  Filled 2021-01-21: qty 30, 30d supply, fill #0

## 2021-01-21 MED ORDER — CLONIDINE HCL 0.1 MG PO TABS: 0.1000 mg | ORAL_TABLET | Freq: Two times a day (BID) | ORAL | Status: DC

## 2021-01-21 MED ORDER — HYDROXYZINE HCL 25 MG PO TABS
25.0000 mg | ORAL_TABLET | Freq: Four times a day (QID) | ORAL | 0 refills | Status: DC
  Filled 2021-01-21: qty 12, 3d supply, fill #0

## 2021-01-21 MED ORDER — SODIUM CHLORIDE 0.9 % IV BOLUS
500.0000 mL | Freq: Once | INTRAVENOUS | Status: AC
  Administered 2021-01-21: 06:00:00 500 mL via INTRAVENOUS

## 2021-01-21 MED ORDER — ONDANSETRON 4 MG PO TBDP
4.0000 mg | ORAL_TABLET | Freq: Once | ORAL | Status: AC
  Administered 2021-01-21: 04:00:00 4 mg via ORAL
  Filled 2021-01-21: qty 1

## 2021-01-21 MED ORDER — METHOCARBAMOL 500 MG PO TABS
500.0000 mg | ORAL_TABLET | Freq: Two times a day (BID) | ORAL | 0 refills | Status: DC
  Filled 2021-01-21: qty 20, 10d supply, fill #0

## 2021-01-21 MED ORDER — CLONIDINE HCL 0.1 MG PO TABS: 0.1000 mg | ORAL_TABLET | Freq: Four times a day (QID) | ORAL | Status: DC

## 2021-01-21 MED ORDER — ONDANSETRON HCL 4 MG PO TABS
4.0000 mg | ORAL_TABLET | Freq: Four times a day (QID) | ORAL | 0 refills | Status: DC
  Filled 2021-01-21: qty 12, 3d supply, fill #0

## 2021-01-21 MED ORDER — HYDROXYZINE HCL 25 MG PO TABS
25.0000 mg | ORAL_TABLET | Freq: Four times a day (QID) | ORAL | Status: DC | PRN
  Administered 2021-01-21: 07:00:00 25 mg via ORAL
  Filled 2021-01-21: qty 1

## 2021-01-21 MED ORDER — NAPROXEN 500 MG PO TABS: 500.0000 mg | ORAL_TABLET | Freq: Two times a day (BID) | ORAL | Status: DC | PRN

## 2021-01-21 MED ORDER — DICYCLOMINE HCL 20 MG PO TABS
20.0000 mg | ORAL_TABLET | Freq: Four times a day (QID) | ORAL | Status: DC | PRN
  Administered 2021-01-21: 07:00:00 20 mg via ORAL
  Filled 2021-01-21: qty 1

## 2021-01-21 MED ORDER — METHOCARBAMOL 500 MG PO TABS: 500.0000 mg | ORAL_TABLET | Freq: Two times a day (BID) | ORAL | 0 refills | Status: DC

## 2021-01-21 MED ORDER — ONDANSETRON HCL 4 MG PO TABS: 4.0000 mg | ORAL_TABLET | Freq: Four times a day (QID) | ORAL | 0 refills | Status: DC

## 2021-01-21 MED ORDER — CLONIDINE HCL 0.1 MG PO TABS: 0.1000 mg | ORAL_TABLET | Freq: Every day | ORAL | Status: DC

## 2021-01-21 MED ORDER — DICYCLOMINE HCL 20 MG PO TABS
20.0000 mg | ORAL_TABLET | Freq: Two times a day (BID) | ORAL | 0 refills | Status: DC
  Filled 2021-01-21: qty 20, 10d supply, fill #0

## 2021-01-21 MED ORDER — CLONIDINE HCL 0.1 MG PO TABS: 0.1000 mg | ORAL_TABLET | Freq: Once | ORAL | 0 refills | Status: DC

## 2021-01-21 MED ORDER — SODIUM CHLORIDE 0.9 % IV SOLN
25.0000 mg | Freq: Four times a day (QID) | INTRAVENOUS | Status: DC | PRN
  Administered 2021-01-21: 08:00:00 25 mg via INTRAVENOUS
  Filled 2021-01-21: qty 25

## 2021-01-21 MED ORDER — LOPERAMIDE HCL 2 MG PO CAPS: 2.0000 mg | ORAL_CAPSULE | ORAL | Status: DC | PRN

## 2021-01-21 NOTE — ED Notes (Signed)
Pt's mom standing at nurses station, states that pt wants to leave.  PA notified, PA at bedside.

## 2021-01-21 NOTE — ED Triage Notes (Signed)
Pt BIB EMS with complaints of abdominal pain and constipation x 3 weeks. The pain became more severe 30 mins ago with vomiting.

## 2021-01-21 NOTE — ED Notes (Signed)
Pt in bed, pt appears more calm, pt denies pain at iv site, ns infusion flowing, no signs of swelling.
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
## 2021-01-21 NOTE — ED Notes (Signed)
Pt standing in room, pt has removed her iv, pt states that she wants to leave, talked with pt about plan of care, discussed care and health goals with pt, reviewed risks and benefits of staying vs leaving.  Pt states that she still wants to leave, pt ambulatory from dpt with mom.  PA notified that pt walked out.
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
## 2021-01-21 NOTE — ED Provider Notes (Signed)
°  Physical Exam  BP 129/82 (BP Location: Right Arm)    Pulse 98    Temp 98.4 F (36.9 C) (Oral)    Resp 18    Ht 5\' 3"  (1.6 m)    Wt 77.1 kg    SpO2 100%    BMI 30.11 kg/m   Physical Exam  ED Course/Procedures     Procedures  MDM  Accepted handoff at shift change from [facility], PA-C. Please see prior provider note for more detail.   Briefly: Patient is 25 y.o. presenting to the ED for eval of abdominal pain with nausea and vomiting as well as constipation for 3 weeks. Was on suboxone, but is now on heroin.   DDX: concern for withdrawal  Plan: [facility] detox and most likely discharge.  I accepted this patient at handoff with [facility] detox order placed. On my evaluation, the patient reports she would like to go home. I initiated a PO challenge, but the patient refused stating she wanted to go home. I advised her that this would be against my medical advice, but the patient was insistent. The patient eloped prior to discharge and was unable to discuss return precautions with her. Prescriptions sent to pharmacy.      26, PA-C 01/23/21 0850    01/25/21, MD 01/26/21 [phone]

## 2021-01-22 ENCOUNTER — Other Ambulatory Visit (HOSPITAL_COMMUNITY): Payer: Self-pay

## 2021-01-22 ENCOUNTER — Other Ambulatory Visit: Payer: Self-pay | Admitting: Internal Medicine

## 2021-01-22 ENCOUNTER — Other Ambulatory Visit: Payer: Self-pay | Admitting: Student

## 2021-01-22 DIAGNOSIS — G4700 Insomnia, unspecified: Secondary | ICD-10-CM

## 2021-01-22 DIAGNOSIS — F1121 Opioid dependence, in remission: Secondary | ICD-10-CM

## 2021-01-23 ENCOUNTER — Other Ambulatory Visit (HOSPITAL_COMMUNITY): Payer: Self-pay

## 2021-01-24 ENCOUNTER — Other Ambulatory Visit (HOSPITAL_COMMUNITY): Payer: Self-pay

## 2021-01-24 NOTE — Telephone Encounter (Signed)
Patient missed last [facility] appointment and had a recent ED visit. We will need to see her in a visit to reassess her goals for using suboxone, as it seems she is not using it consistently. Can we schedule her in the next available resident or OUD visit?

## 2021-01-30 ENCOUNTER — Other Ambulatory Visit: Payer: Self-pay

## 2021-01-30 ENCOUNTER — Other Ambulatory Visit (HOSPITAL_COMMUNITY): Payer: Self-pay

## 2021-01-30 DIAGNOSIS — F1121 Opioid dependence, in remission: Secondary | ICD-10-CM

## 2021-01-30 MED ORDER — BUPRENORPHINE HCL-NALOXONE HCL 8-2 MG SL SUBL
1.0000 | SUBLINGUAL_TABLET | Freq: Two times a day (BID) | SUBLINGUAL | 0 refills | Status: DC
  Filled 2021-01-30: qty 28, 14d supply, fill #0

## 2021-01-30 NOTE — Telephone Encounter (Signed)
I see the patient made an appointment with [place] for OUD clinic on 1/17. Will approve 14 day supply and reassess at that visit.

## 2021-01-30 NOTE — Telephone Encounter (Signed)
buprenorphine-naloxone (SUBOXONE) 8-2 mg SUBL SL tablet, REFILL REQUEST @ [facility]. ?

## 2021-02-01 ENCOUNTER — Other Ambulatory Visit (HOSPITAL_COMMUNITY): Payer: Self-pay

## 2021-02-12 ENCOUNTER — Other Ambulatory Visit (HOSPITAL_COMMUNITY): Payer: Self-pay

## 2021-02-12 ENCOUNTER — Telehealth: Payer: Self-pay | Admitting: *Deleted

## 2021-02-12 ENCOUNTER — Other Ambulatory Visit: Payer: Self-pay

## 2021-02-12 ENCOUNTER — Ambulatory Visit (INDEPENDENT_AMBULATORY_CARE_PROVIDER_SITE_OTHER): Payer: Medicaid Other | Admitting: Student in an Organized Health Care Education/Training Program

## 2021-02-12 VITALS — BP 103/48 | HR 82 | Temp 98.1°F | Wt 175.4 lb

## 2021-02-12 DIAGNOSIS — F1121 Opioid dependence, in remission: Secondary | ICD-10-CM | POA: Diagnosis not present

## 2021-02-12 DIAGNOSIS — F411 Generalized anxiety disorder: Secondary | ICD-10-CM | POA: Diagnosis not present

## 2021-02-12 DIAGNOSIS — F1120 Opioid dependence, uncomplicated: Secondary | ICD-10-CM

## 2021-02-12 DIAGNOSIS — M26609 Unspecified temporomandibular joint disorder, unspecified side: Secondary | ICD-10-CM | POA: Diagnosis not present

## 2021-02-12 MED ORDER — BUPRENORPHINE HCL-NALOXONE HCL 8-2 MG SL SUBL
SUBLINGUAL_TABLET | SUBLINGUAL | 0 refills | Status: DC
  Filled 2021-02-12: qty 35, fill #0

## 2021-02-12 MED ORDER — HYDROXYZINE HCL 25 MG PO TABS
25.0000 mg | ORAL_TABLET | Freq: Two times a day (BID) | ORAL | 1 refills | Status: DC | PRN
  Filled 2021-02-12: qty 60, 30d supply, fill #0
  Filled 2021-03-14: qty 60, 30d supply, fill #1

## 2021-02-12 MED ORDER — BUPRENORPHINE HCL-NALOXONE HCL 8-2 MG SL SUBL
SUBLINGUAL_TABLET | SUBLINGUAL | 0 refills | Status: DC
  Filled 2021-02-12: qty 28, fill #0

## 2021-02-12 MED ORDER — BUPRENORPHINE HCL-NALOXONE HCL 8-2 MG SL SUBL
2.5000 | SUBLINGUAL_TABLET | Freq: Every day | SUBLINGUAL | 0 refills | Status: DC
  Filled 2021-02-12: qty 35, 14d supply, fill #0

## 2021-02-12 MED ORDER — ESCITALOPRAM OXALATE 20 MG PO TABS
20.0000 mg | ORAL_TABLET | Freq: Every day | ORAL | 2 refills | Status: DC
  Filled 2021-02-12: qty 30, 30d supply, fill #0

## 2021-02-12 MED ORDER — TRAZODONE HCL 100 MG PO TABS
100.0000 mg | ORAL_TABLET | Freq: Every day | ORAL | 2 refills | Status: DC
  Filled 2021-02-12: qty 30, 30d supply, fill #0
  Filled 2021-03-14: qty 30, 30d supply, fill #1
  Filled 2021-04-10: qty 30, 30d supply, fill #2

## 2021-02-12 NOTE — Assessment & Plan Note (Signed)
Symptoms of headache that radiate from the right jaw, with click appreciated on exam, consistent with TMJ syndrome. We talked about avoiding teeth grind, which she does do. Under a lot of stress right now. We talked about OTC ibuprofen and tylenol as need. And I recommended establishing care with a dentist.

## 2021-02-12 NOTE — Telephone Encounter (Signed)
I resent the rx and removed "in divided doses"

## 2021-02-12 NOTE — Addendum Note (Signed)
Addended by: Erlinda Hong T on: 02/12/2021 11:54 AM   Modules accepted: Orders

## 2021-02-12 NOTE — Assessment & Plan Note (Signed)
Severe OUD that has been difficult to control since September, partly due to terrible social situations. She has re-engaged in treatment in late December, has transitioned off daily heroin onto suboxone again. She reports good adherence, no withdrawals, but is having significant cravings. Will increase suboxone to 20mg  daily in divided doses. Will check urine toxassure today to confirm adherence. Database review is appropriate. Hopefully we can maintain her on suboxone treatment to give the best support to work through her legal and home challenges.

## 2021-02-12 NOTE — Telephone Encounter (Signed)
Call from [facility] [place] - stated they need clarification on directions: buprenorphine-naloxone (SUBOXONE) 8-2 mg SUBL SL tablet  Take 2 and 1/2 tablets sublingually daily in divided doses.  When to take "in divided doses" ;send new rx Thanks

## 2021-02-12 NOTE — Assessment & Plan Note (Signed)
Severe anxiety symptoms that are limiting her daily life. She does have a history of bipolar disorder in the chart, has been referred to psychiatry in the past, seen them in the ED, but has not established yet. She reports poor response to sertraline in the past, no benefit with wellbutrin. Will try hydroxyzine twice daily for anxiety, continue trazodone 100mg  nightly for sleep. Probably would do well with low dose antipsychotic in the future, would prefer for there to establish with psychiatry for that.
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
## 2021-02-13 ENCOUNTER — Other Ambulatory Visit (HOSPITAL_COMMUNITY): Payer: Self-pay

## 2021-02-15 LAB — TOXASSURE SELECT,+ANTIDEPR,UR

## 2021-02-25 ENCOUNTER — Other Ambulatory Visit (HOSPITAL_COMMUNITY): Payer: Self-pay

## 2021-02-25 ENCOUNTER — Other Ambulatory Visit: Payer: Self-pay | Admitting: Student in an Organized Health Care Education/Training Program

## 2021-02-25 DIAGNOSIS — F1121 Opioid dependence, in remission: Secondary | ICD-10-CM

## 2021-02-25 NOTE — Telephone Encounter (Signed)
Will defer this refill request to tomorrow's OUD visit.

## 2021-02-26 ENCOUNTER — Other Ambulatory Visit: Payer: Self-pay

## 2021-02-26 ENCOUNTER — Other Ambulatory Visit (HOSPITAL_COMMUNITY): Payer: Self-pay

## 2021-02-26 ENCOUNTER — Ambulatory Visit (INDEPENDENT_AMBULATORY_CARE_PROVIDER_SITE_OTHER): Payer: Medicaid Other | Admitting: Student in an Organized Health Care Education/Training Program

## 2021-02-26 ENCOUNTER — Encounter: Payer: Self-pay | Admitting: Student in an Organized Health Care Education/Training Program

## 2021-02-26 DIAGNOSIS — F1121 Opioid dependence, in remission: Secondary | ICD-10-CM

## 2021-02-26 DIAGNOSIS — F1120 Opioid dependence, uncomplicated: Secondary | ICD-10-CM

## 2021-02-26 MED ORDER — BUPRENORPHINE HCL-NALOXONE HCL 8-2 MG SL SUBL
2.5000 | SUBLINGUAL_TABLET | Freq: Every day | SUBLINGUAL | 0 refills | Status: DC
  Filled 2021-02-26: qty 35, 14d supply, fill #0
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
## 2021-02-26 NOTE — Assessment & Plan Note (Signed)
High risk time for [facility]. She reports doing well on the dose increase from last visit. Will plan to continue with Suboxone 8mg  tabs, taking 20mg  daily in divided doses. I refilled another two week supply. She has an acute pain generator with an odontogenic infection. We talked about how hydrocodone and other oral opioids will be ineffective, they cannot out-compete the buprenorphine. So her pain management strategy will have to be with NSAIDs and Tylenol. We talked about how pain is often a trigger leading to relapse. She understands the risk of this. She is still able to dissolve the tab in her mouth, I recommended using the opposite side of her mouth to the infection. Will follow up with her in person in 2 weeks.

## 2021-02-28 ENCOUNTER — Other Ambulatory Visit (HOSPITAL_COMMUNITY): Payer: Self-pay

## 2021-03-04 ENCOUNTER — Other Ambulatory Visit (HOSPITAL_COMMUNITY): Payer: Self-pay

## 2021-03-05 NOTE — Telephone Encounter (Signed)
Completed.

## 2021-03-11 ENCOUNTER — Other Ambulatory Visit (HOSPITAL_COMMUNITY): Payer: Self-pay

## 2021-03-11 ENCOUNTER — Other Ambulatory Visit: Payer: Self-pay | Admitting: Student in an Organized Health Care Education/Training Program

## 2021-03-11 DIAGNOSIS — F1121 Opioid dependence, in remission: Secondary | ICD-10-CM

## 2021-03-11 MED ORDER — BUPRENORPHINE HCL-NALOXONE HCL 8-2 MG SL SUBL
2.5000 | SUBLINGUAL_TABLET | Freq: Every day | SUBLINGUAL | 0 refills | Status: DC
  Filled 2021-03-11: qty 35, 14d supply, fill #0
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
## 2021-03-12 ENCOUNTER — Other Ambulatory Visit (HOSPITAL_COMMUNITY): Payer: Self-pay

## 2021-03-12 ENCOUNTER — Ambulatory Visit (INDEPENDENT_AMBULATORY_CARE_PROVIDER_SITE_OTHER): Payer: Medicaid Other | Admitting: Student

## 2021-03-12 ENCOUNTER — Other Ambulatory Visit: Payer: Self-pay

## 2021-03-12 DIAGNOSIS — F411 Generalized anxiety disorder: Secondary | ICD-10-CM | POA: Diagnosis not present

## 2021-03-12 DIAGNOSIS — F1120 Opioid dependence, uncomplicated: Secondary | ICD-10-CM | POA: Diagnosis not present

## 2021-03-12 DIAGNOSIS — F1121 Opioid dependence, in remission: Secondary | ICD-10-CM | POA: Diagnosis not present

## 2021-03-12 DIAGNOSIS — M26609 Unspecified temporomandibular joint disorder, unspecified side: Secondary | ICD-10-CM

## 2021-03-12 MED ORDER — BUPRENORPHINE HCL-NALOXONE HCL 8-2 MG SL SUBL
3.0000 | SUBLINGUAL_TABLET | Freq: Every day | SUBLINGUAL | 0 refills | Status: DC
  Filled 2021-03-12: qty 42, 14d supply, fill #0

## 2021-03-12 NOTE — Assessment & Plan Note (Addendum)
[name] continues to struggle on the current dose of her Suboxone. She is in significant pain from her dental infection/caries. Antibiotics was not helpful and OTC Tylenol and NSAIDs did not help. She feels like her current dose of Suboxone (2.5 tablets daily) does nothing for her now. Reports that she relapsed while on 3 tablets a day. She feels like her body has built tolerance to the Suboxone and she continues to feel strong cravings. She does not want to relapse and so has not tried any illicit drugs. She was informed that we will go up back to 3 tablets a day however this is the maximum dose. She was provided with a list of resources available in the community to provide higher level treatment if we are not able to control her cravings on maximal dose of Suboxone. We are hopeful that with the management of her dental issues, her pain will improve. -- Increase Suboxone 8-2 mg to 3 tablets daily --Follow-up tox screen --2-week follow-up  Addendum: Patient's toxicology screen showed expected buprenorphine, trazodone and their metabolites.  It also showed unexpected results of oxycodone, fentanyl and methamphetamine. Please discuss with patient at next OUD clinic visit.

## 2021-03-12 NOTE — Patient Instructions (Addendum)
Thank you, [name] for allowing [place] to provide your care today. Today we discussed your dental procedure and suboxone use.  The maximal dose of Suboxone we can give you is 3 tablets/day.  If you require more to help with your cravings there are other resources available for you below.  [facility] [phone] [address] [place], [place]   [facility] [phone] [address] [place], [place]   [facility] [phone] [address] - [place], [place]  [facility]  Syringe exchange, assistance with treatment [phone]  I have ordered the following labs for you:  Lab Orders         ToxAssure Select,+Antidepr,UR       I have ordered the following medication/changed the following medications:  Increase his Suboxone to 3 tablets daily  My Chart Access: [email or web address]  Please follow-up in 2 weeks  Please make sure to arrive 15 minutes prior to your next appointment. If you arrive late, you may be asked to reschedule.    We look forward to seeing you next time. Please call our clinic at [phone] if you have any questions or concerns. The best time to call is Monday-Friday from 9am-4pm, but there is someone available 24/7. If after hours or the weekend, call the main hospital number and ask for the Internal Medicine Resident On-Call. If you need medication refills, please notify your pharmacy one week in advance and they will send [place] a request.   Thank you for letting us take part in your care. Wishing you the best!  Steffanie Rainwater, MD 03/12/2021, 10:25 AM IM Resident, PGY-2 [place] Heck 41:10

## 2021-03-12 NOTE — Progress Notes (Signed)
° °  03/12/2021  [name] presents for follow up of opioid use disorder I have reviewed the prior induction visit, follow up visits, and telephone encounters relevant to opiate use disorder (OUD) treatment.   Current daily dose: Suboxone 8-2 mg 2.5 tablets daily  Date of Induction: Initial 01/10/2020, reengaged 12/25/2020  Current follow up interval, in weeks: 2 weeks  The patient has been adherent with the buprenorphine for OUD contract.  However, she has had some relapse.  Last UDS Result: 1/17, appropriate for buprenorphine, trazodone and their metabolites.  Inappropriate for hydrocodone and metabolites.  HPI: Patient is a 26 year old with a history of OUD, anxiety and trauma who presents for 2 weeks follow-up on his Suboxone treatment.  Patient has a tooth extraction scheduled for today. She had one of her left upper molars fall out 2 weeks ago. She saw a dentist for this and was put on antibiotics. Pain did not improve he decided to do teeth extraction. Her current dose of Suboxone has not been sufficient for her pain. She continues to have cravings due to the significant pain however afraid to relapse again. She does not feel like even the 3 tablets a day was stopped her cravings last year. Feels like her body has gotten used to the Suboxone.   Exam:   Vitals:   03/12/21 1337  BP: (!) 106/53  Pulse: 83  Temp: 98 F (36.7 C)  TempSrc: Oral  SpO2: 100%  Weight: 180 lb 14.4 oz (82.1 kg)   General: Pleasant, young obese female.  NAD. HEENT: [place]/AT.  Dental caries with a missing R upper molar. Mild swelling around the right TMJ.  CV: RRR. No murmurs, rubs, or gallops. No LE edema Pulmonary: Lungs CTAB. Normal effort. No wheezing or rales. Skin: Warm and dry. No obvious rash or lesions. Neuro: A&Ox3. Moves all extremities. Quiet but answers questions appropriately Psych: Slightly frustrated and anxious mood.    Assessment/Plan:  See Problem Based Charting in the Encounters  Tab   Melinda Rainwater, MD  03/12/2021  10:16 AM

## 2021-03-12 NOTE — Assessment & Plan Note (Signed)
Patient continues to have some headache.  On exam, she has dental caries, missing left upper molar and some swelling around the right TMJ.  Plan for tooth extraction today at the dentist. -- Follow-up on pain after tooth extraction

## 2021-03-12 NOTE — Assessment & Plan Note (Signed)
Patient reports that she feels jittery today.  She continues to have significant anxiety due to the current dental problems and uncontrolled pain.  She is worried about relapsing again and concerned that her Suboxone is not working as well as it did before when she started the treatment. -- GAD-7 at next office visit -- Continue Atarax 25 mg every 12 hours as needed

## 2021-03-14 ENCOUNTER — Other Ambulatory Visit (HOSPITAL_COMMUNITY): Payer: Self-pay

## 2021-03-14 NOTE — Progress Notes (Signed)
Internal Medicine Clinic Attending  Case discussed with Dr. Amponsah  At the time of the visit.  We reviewed the resident's history and exam and pertinent patient test results.  I agree with the assessment, diagnosis, and plan of care documented in the resident's note.  

## 2021-03-15 ENCOUNTER — Other Ambulatory Visit (HOSPITAL_COMMUNITY): Payer: Self-pay

## 2021-03-20 LAB — TOXASSURE SELECT,+ANTIDEPR,UR

## 2021-03-26 ENCOUNTER — Encounter: Payer: Self-pay | Admitting: Student in an Organized Health Care Education/Training Program

## 2021-03-26 ENCOUNTER — Other Ambulatory Visit: Payer: Self-pay

## 2021-03-26 ENCOUNTER — Other Ambulatory Visit: Payer: Self-pay | Admitting: Student

## 2021-03-26 ENCOUNTER — Other Ambulatory Visit: Payer: Self-pay | Admitting: Student in an Organized Health Care Education/Training Program

## 2021-03-26 ENCOUNTER — Other Ambulatory Visit (HOSPITAL_COMMUNITY): Payer: Self-pay

## 2021-03-26 ENCOUNTER — Ambulatory Visit (INDEPENDENT_AMBULATORY_CARE_PROVIDER_SITE_OTHER): Payer: Medicaid Other | Admitting: Student in an Organized Health Care Education/Training Program

## 2021-03-26 DIAGNOSIS — F1121 Opioid dependence, in remission: Secondary | ICD-10-CM | POA: Diagnosis not present

## 2021-03-26 DIAGNOSIS — F1120 Opioid dependence, uncomplicated: Secondary | ICD-10-CM | POA: Diagnosis not present

## 2021-03-26 DIAGNOSIS — F411 Generalized anxiety disorder: Secondary | ICD-10-CM

## 2021-03-26 MED ORDER — GABAPENTIN 300 MG PO CAPS
300.0000 mg | ORAL_CAPSULE | Freq: Three times a day (TID) | ORAL | 2 refills | Status: DC
  Filled 2021-03-26 – 2021-04-01 (×2): qty 90, 30d supply, fill #0

## 2021-03-26 MED ORDER — HYDROXYZINE HCL 25 MG PO TABS
25.0000 mg | ORAL_TABLET | Freq: Two times a day (BID) | ORAL | 1 refills | Status: DC | PRN
  Filled 2021-03-26: qty 60, 30d supply, fill #0

## 2021-03-26 MED ORDER — BUPRENORPHINE HCL-NALOXONE HCL 8-2 MG SL SUBL
3.0000 | SUBLINGUAL_TABLET | Freq: Every day | SUBLINGUAL | 0 refills | Status: AC
  Filled 2021-03-26: qty 42, 14d supply, fill #0

## 2021-03-26 NOTE — Assessment & Plan Note (Signed)
Uncontrolled at this point with frequent relapses. She does not seem to have much confidence in the suboxone strategy at this point for treating her OUD. She has very little insight into her disease. She is using counterfit oxycodone tablets that contain fentanyl on a regular basis in addition to the suboxone. She was given other OUD resources at last visit, we talked for a time about methadone treatment. She is contemplative but has not reached out to any clinics to research if she can access them. She tells me she does want to continue suboxone for now. We re-iterated our treatment agreement, we set a goal for good adherence with suboxone and no oxycodone use over the next two weeks. We will follow up with her in person in two weeks to reassess. I suspect she may need a higher level of care very soon, and I want to help coordinate this with her to minimize treatment interruptions.

## 2021-03-26 NOTE — Progress Notes (Signed)
°  [facility] Internal Medicine Residency Telephone Encounter Continuity Care Appointment  HPI:  This telephone encounter was created for [name] on 03/26/2021 for the following purpose/cc OUD.  Patient last seen in person 2 weeks ago. She has been struggling over the last 6 weeks due to a number of stressors, one of which was an odontogenic infection that was causing her a lot of pain. She has had strong cravings for opioids, says she took an oxycodone from a friend because the pain was so severe. Says the pills were counterfit, was not surprised to hear that she had fentanyl in her system. She tells me that she still feels like the suboxone is no longer effective for her. She has had trouble sleeping for several weeks, but is tired all the time. She was supposed to come in person today but changed this to a tele-visit last minute because she was too tired to come to clinic. Denies having withdrawals, no recent overdose or ED visit. She was given a list of higher-level clinics and methadone clinics, she is considering but has not researched them any further.    Past Medical History:  Past Medical History:  Diagnosis Date   ADHD (attention deficit hyperactivity disorder)    Anesthesia complication    woke up fighting after tonsillectomy   Anxiety    Cholecystitis    Complication of anesthesia    when wakes up " freaks out- like panic attack"   Depression    Dysmenorrhea    Endometriosis    GERD (gastroesophageal reflux disease)    Headache    migraines   Hyperlipidemia    Hypoglycemia    Polysubstance abuse (HCC)    Pregnancy complicated by subutex maintenance, antepartum (HCC)    Syncope    Viral warts    hand   Vision abnormalities    wears glasses      Assessment / Plan / Recommendations:  Please see A&P under problem oriented charting for assessment of the patient's acute and chronic medical conditions.  As always, pt is advised that if symptoms worsen or new  symptoms arise, they should go to an urgent care facility or to to ER for further evaluation.   Consent and Medical Decision Making:   This is a telephone encounter between [name] and Tyson Alias on 03/26/2021 for OUD. The visit was conducted with the patient located at home and Tyson Alias at [facility]. The patient's identity was confirmed using their DOB and current address. The patient has consented to being evaluated through a telephone encounter and understands the associated risks (an examination cannot be done and the patient may need to come in for an appointment) / benefits (allows the patient to remain at home, decreasing exposure to coronavirus). I personally spent 12 minutes on medical discussion.

## 2021-03-27 ENCOUNTER — Ambulatory Visit (INDEPENDENT_AMBULATORY_CARE_PROVIDER_SITE_OTHER): Payer: Medicaid Other

## 2021-03-27 ENCOUNTER — Other Ambulatory Visit (HOSPITAL_COMMUNITY)
Admission: RE | Admit: 2021-03-27 | Discharge: 2021-03-27 | Disposition: A | Discharge status: Home / Self Care | Payer: Medicaid Other | Source: Ambulatory Visit | Attending: Family Medicine | Admitting: Family Medicine

## 2021-03-27 VITALS — BP 108/67 | HR 79 | Wt 183.3 lb

## 2021-03-27 DIAGNOSIS — N898 Other specified noninflammatory disorders of vagina: Secondary | ICD-10-CM | POA: Insufficient documentation

## 2021-03-27 NOTE — Progress Notes (Signed)
Pt here today for STD screening due to potential exposure and vaginal discharge with odor that is constant and has a fishy odor.  Pt explained how to obtain self swab and that we will call with abnormal results.  Pt verbalized understanding with no further questions.  ? ?Daesha Insco,RN  ?03/27/21 ?

## 2021-03-28 ENCOUNTER — Telehealth: Payer: Self-pay | Admitting: Family Medicine

## 2021-03-28 ENCOUNTER — Encounter: Payer: Self-pay | Admitting: Family Medicine

## 2021-03-28 ENCOUNTER — Encounter: Payer: Self-pay | Admitting: *Deleted

## 2021-03-28 ENCOUNTER — Encounter: Payer: Self-pay | Admitting: Student in an Organized Health Care Education/Training Program

## 2021-03-28 ENCOUNTER — Telehealth: Payer: Self-pay

## 2021-03-28 LAB — CERVICOVAGINAL ANCILLARY ONLY
Bacterial Vaginitis (gardnerella): POSITIVE — AB
Candida Glabrata: NEGATIVE
Candida Vaginitis: NEGATIVE
Chlamydia: NEGATIVE
Comment: NEGATIVE
Comment: NEGATIVE
Comment: NEGATIVE
Comment: NEGATIVE
Comment: NEGATIVE
Comment: NORMAL
Neisseria Gonorrhea: NEGATIVE
Trichomonas: NEGATIVE

## 2021-03-28 NOTE — Telephone Encounter (Signed)
Opened in error

## 2021-03-28 NOTE — Telephone Encounter (Signed)
Patient's mother notified that letter is ready for p/u. ?

## 2021-03-28 NOTE — Telephone Encounter (Signed)
Letter completed.

## 2021-03-28 NOTE — Telephone Encounter (Signed)
Returned call to patient's mother. States she needs an updated letter for court; how long she's been in treatment, and updated med list. ?

## 2021-03-28 NOTE — Telephone Encounter (Signed)
Pt's mother requesting a letter for her daughter. States this letter is for court. Please call back.  ?

## 2021-03-29 ENCOUNTER — Other Ambulatory Visit (HOSPITAL_COMMUNITY): Payer: Self-pay

## 2021-03-29 ENCOUNTER — Telehealth: Payer: Self-pay

## 2021-03-29 DIAGNOSIS — B9689 Other specified bacterial agents as the cause of diseases classified elsewhere: Secondary | ICD-10-CM

## 2021-03-29 MED ORDER — METRONIDAZOLE 500 MG PO TABS
500.0000 mg | ORAL_TABLET | Freq: Two times a day (BID) | ORAL | 0 refills | Status: DC
  Filled 2021-03-29: qty 14, 7d supply, fill #0

## 2021-03-29 NOTE — Telephone Encounter (Signed)
-----   Message from Griffin Basil, MD sent at 03/29/2021  9:47 AM EST ----- ?BV noted on vaginal swab, will offer treatment ?

## 2021-03-29 NOTE — Telephone Encounter (Signed)
I called patient and reviewed results with her and informed her a prescription for treatment was sent to her pharmacy. I also verified patient's pharmacy. Patient verbalized understanding and denies any other questions.  ? ?Alesia Richards, RN ?03/29/21 ?

## 2021-04-01 ENCOUNTER — Other Ambulatory Visit (HOSPITAL_COMMUNITY): Payer: Self-pay

## 2021-04-04 ENCOUNTER — Other Ambulatory Visit (HOSPITAL_COMMUNITY): Payer: Self-pay

## 2021-04-10 ENCOUNTER — Other Ambulatory Visit (HOSPITAL_COMMUNITY): Payer: Self-pay

## 2021-04-10 ENCOUNTER — Other Ambulatory Visit: Payer: Self-pay | Admitting: Student in an Organized Health Care Education/Training Program

## 2021-04-10 DIAGNOSIS — F1121 Opioid dependence, in remission: Secondary | ICD-10-CM

## 2021-04-11 ENCOUNTER — Other Ambulatory Visit (HOSPITAL_COMMUNITY): Payer: Self-pay

## 2021-04-11 NOTE — Telephone Encounter (Signed)
Patient's mother called in wanting to know why patient's suboxone was refused. Explained patient no showed appt on 04/09/21. She had been made aware that she must come in person for that appt. Also, patient told Provider at last tele appt that suboxone was not helping her. She was given information on clinics that could offer a higher level of care. Mom states she did not know patient had appt that day or she would have brought her. Also, states patient is not using and she doesn't want her on methadone. Last UDS was positive for fentanyl and meth. Explained Provider feels methadone may be the next option as suboxone is not helping her. Mom voiced understanding and politely ended call. ?

## 2022-04-13 ENCOUNTER — Inpatient Hospital Stay (HOSPITAL_COMMUNITY): Payer: Medicaid Other

## 2022-04-13 ENCOUNTER — Inpatient Hospital Stay (HOSPITAL_COMMUNITY)
Admission: EM | Admit: 2022-04-13 | Discharge: 2022-04-17 | DRG: 917 | Disposition: A | Discharge status: Other Acute Inpatient Hospital | Payer: Medicaid Other | Attending: Internal Medicine | Admitting: Internal Medicine

## 2022-04-13 ENCOUNTER — Emergency Department (HOSPITAL_COMMUNITY): Payer: Medicaid Other

## 2022-04-13 ENCOUNTER — Encounter (HOSPITAL_COMMUNITY): Payer: Self-pay | Admitting: Pulmonary Disease

## 2022-04-13 DIAGNOSIS — J9600 Acute respiratory failure, unspecified whether with hypoxia or hypercapnia: Secondary | ICD-10-CM | POA: Diagnosis not present

## 2022-04-13 DIAGNOSIS — F17210 Nicotine dependence, cigarettes, uncomplicated: Secondary | ICD-10-CM | POA: Diagnosis present

## 2022-04-13 DIAGNOSIS — Z833 Family history of diabetes mellitus: Secondary | ICD-10-CM

## 2022-04-13 DIAGNOSIS — T50904A Poisoning by unspecified drugs, medicaments and biological substances, undetermined, initial encounter: Principal | ICD-10-CM

## 2022-04-13 DIAGNOSIS — Z888 Allergy status to other drugs, medicaments and biological substances status: Secondary | ICD-10-CM | POA: Diagnosis not present

## 2022-04-13 DIAGNOSIS — E785 Hyperlipidemia, unspecified: Secondary | ICD-10-CM | POA: Diagnosis present

## 2022-04-13 DIAGNOSIS — E876 Hypokalemia: Secondary | ICD-10-CM | POA: Diagnosis present

## 2022-04-13 DIAGNOSIS — G928 Other toxic encephalopathy: Secondary | ICD-10-CM | POA: Diagnosis present

## 2022-04-13 DIAGNOSIS — Z23 Encounter for immunization: Secondary | ICD-10-CM

## 2022-04-13 DIAGNOSIS — T424X2A Poisoning by benzodiazepines, intentional self-harm, initial encounter: Principal | ICD-10-CM | POA: Diagnosis present

## 2022-04-13 DIAGNOSIS — Z555 Less than a high school diploma: Secondary | ICD-10-CM

## 2022-04-13 DIAGNOSIS — T401X2A Poisoning by heroin, intentional self-harm, initial encounter: Secondary | ICD-10-CM | POA: Diagnosis present

## 2022-04-13 DIAGNOSIS — R092 Respiratory arrest: Secondary | ICD-10-CM

## 2022-04-13 DIAGNOSIS — T40601A Poisoning by unspecified narcotics, accidental (unintentional), initial encounter: Secondary | ICD-10-CM | POA: Diagnosis present

## 2022-04-13 DIAGNOSIS — F1910 Other psychoactive substance abuse, uncomplicated: Secondary | ICD-10-CM

## 2022-04-13 DIAGNOSIS — M6282 Rhabdomyolysis: Secondary | ICD-10-CM | POA: Diagnosis present

## 2022-04-13 DIAGNOSIS — Z1152 Encounter for screening for COVID-19: Secondary | ICD-10-CM

## 2022-04-13 DIAGNOSIS — T405X2A Poisoning by cocaine, intentional self-harm, initial encounter: Secondary | ICD-10-CM | POA: Diagnosis present

## 2022-04-13 DIAGNOSIS — F419 Anxiety disorder, unspecified: Secondary | ICD-10-CM | POA: Diagnosis present

## 2022-04-13 DIAGNOSIS — F411 Generalized anxiety disorder: Secondary | ICD-10-CM | POA: Diagnosis present

## 2022-04-13 DIAGNOSIS — Z8249 Family history of ischemic heart disease and other diseases of the circulatory system: Secondary | ICD-10-CM | POA: Diagnosis not present

## 2022-04-13 DIAGNOSIS — J9601 Acute respiratory failure with hypoxia: Secondary | ICD-10-CM | POA: Diagnosis present

## 2022-04-13 DIAGNOSIS — Z825 Family history of asthma and other chronic lower respiratory diseases: Secondary | ICD-10-CM

## 2022-04-13 DIAGNOSIS — F1410 Cocaine abuse, uncomplicated: Secondary | ICD-10-CM | POA: Diagnosis present

## 2022-04-13 DIAGNOSIS — Z818 Family history of other mental and behavioral disorders: Secondary | ICD-10-CM

## 2022-04-13 DIAGNOSIS — Z91040 Latex allergy status: Secondary | ICD-10-CM

## 2022-04-13 DIAGNOSIS — Z885 Allergy status to narcotic agent status: Secondary | ICD-10-CM

## 2022-04-13 DIAGNOSIS — K219 Gastro-esophageal reflux disease without esophagitis: Secondary | ICD-10-CM | POA: Diagnosis present

## 2022-04-13 DIAGNOSIS — Z91041 Radiographic dye allergy status: Secondary | ICD-10-CM

## 2022-04-13 DIAGNOSIS — R404 Transient alteration of awareness: Secondary | ICD-10-CM

## 2022-04-13 DIAGNOSIS — F32A Depression, unspecified: Secondary | ICD-10-CM | POA: Diagnosis present

## 2022-04-13 DIAGNOSIS — D61818 Other pancytopenia: Secondary | ICD-10-CM | POA: Diagnosis not present

## 2022-04-13 DIAGNOSIS — Z5982 Transportation insecurity: Secondary | ICD-10-CM

## 2022-04-13 DIAGNOSIS — Z9151 Personal history of suicidal behavior: Secondary | ICD-10-CM

## 2022-04-13 DIAGNOSIS — N179 Acute kidney failure, unspecified: Secondary | ICD-10-CM | POA: Diagnosis present

## 2022-04-13 DIAGNOSIS — T50901A Poisoning by unspecified drugs, medicaments and biological substances, accidental (unintentional), initial encounter: Secondary | ICD-10-CM

## 2022-04-13 DIAGNOSIS — R45851 Suicidal ideations: Secondary | ICD-10-CM | POA: Diagnosis present

## 2022-04-13 DIAGNOSIS — G4700 Insomnia, unspecified: Secondary | ICD-10-CM | POA: Diagnosis present

## 2022-04-13 DIAGNOSIS — R739 Hyperglycemia, unspecified: Secondary | ICD-10-CM | POA: Diagnosis present

## 2022-04-13 DIAGNOSIS — Z79899 Other long term (current) drug therapy: Secondary | ICD-10-CM

## 2022-04-13 HISTORY — DX: Poisoning by unspecified drugs, medicaments and biological substances, accidental (unintentional), initial encounter: T50.901A

## 2022-04-13 LAB — BLOOD GAS, ARTERIAL
Acid-base deficit: 1.4 mmol/L (ref 0.0–2.0)
Bicarbonate: 22.8 mmol/L (ref 20.0–28.0)
Drawn by: 11249
FIO2: 100 %
MECHVT: 500 mL
O2 Saturation: 99.2 %
PEEP: 5 cmH2O
Patient temperature: 37
RATE: 16 resp/min
pCO2 arterial: 36 mmHg (ref 32–48)
pH, Arterial: 7.41 (ref 7.35–7.45)
pO2, Arterial: 489 mmHg — ABNORMAL HIGH (ref 83–108)

## 2022-04-13 LAB — CBC WITH DIFFERENTIAL/PLATELET
Abs Immature Granulocytes: 0.01 10*3/uL (ref 0.00–0.07)
Basophils Absolute: 0 10*3/uL (ref 0.0–0.1)
Basophils Relative: 1 %
Eosinophils Absolute: 0.4 10*3/uL (ref 0.0–0.5)
Eosinophils Relative: 8 %
HCT: 36.5 % (ref 36.0–46.0)
Hemoglobin: 11.6 g/dL — ABNORMAL LOW (ref 12.0–15.0)
Immature Granulocytes: 0 %
Lymphocytes Relative: 42 %
Lymphs Abs: 2.1 10*3/uL (ref 0.7–4.0)
MCH: 26 pg (ref 26.0–34.0)
MCHC: 31.8 g/dL (ref 30.0–36.0)
MCV: 81.8 fL (ref 80.0–100.0)
Monocytes Absolute: 0.5 10*3/uL (ref 0.1–1.0)
Monocytes Relative: 10 %
Neutro Abs: 1.9 10*3/uL (ref 1.7–7.7)
Neutrophils Relative %: 39 %
Platelets: 191 10*3/uL (ref 150–400)
RBC: 4.46 MIL/uL (ref 3.87–5.11)
RDW: 13.6 % (ref 11.5–15.5)
WBC: 5 10*3/uL (ref 4.0–10.5)
nRBC: 0 % (ref 0.0–0.2)

## 2022-04-13 LAB — COMPREHENSIVE METABOLIC PANEL
ALT: 20 U/L (ref 0–44)
AST: 28 U/L (ref 15–41)
Albumin: 4.1 g/dL (ref 3.5–5.0)
Alkaline Phosphatase: 51 U/L (ref 38–126)
Anion gap: 10 (ref 5–15)
BUN: 18 mg/dL (ref 6–20)
CO2: 23 mmol/L (ref 22–32)
Calcium: 9.3 mg/dL (ref 8.9–10.3)
Chloride: 104 mmol/L (ref 98–111)
Creatinine, Ser: 1.43 mg/dL — ABNORMAL HIGH (ref 0.44–1.00)
GFR, Estimated: 52 mL/min — ABNORMAL LOW (ref >60)
Glucose, Bld: 83 mg/dL (ref 70–99)
Potassium: 3.8 mmol/L (ref 3.5–5.1)
Sodium: 137 mmol/L (ref 135–145)
Total Bilirubin: 0.4 mg/dL (ref 0.3–1.2)
Total Protein: 6.6 g/dL (ref 6.5–8.1)

## 2022-04-13 LAB — RAPID URINE DRUG SCREEN, HOSP PERFORMED
Amphetamines: NOT DETECTED
Barbiturates: NOT DETECTED
Benzodiazepines: POSITIVE — AB
Cocaine: POSITIVE — AB
Opiates: POSITIVE — AB
Tetrahydrocannabinol: NOT DETECTED

## 2022-04-13 LAB — ACETAMINOPHEN LEVEL: Acetaminophen (Tylenol), Serum: <10 ug/mL — ABNORMAL LOW (ref 10–30)

## 2022-04-13 LAB — RESP PANEL BY RT-PCR (RSV, FLU A&B, COVID)  RVPGX2
Influenza A by PCR: NEGATIVE
Influenza B by PCR: NEGATIVE
Resp Syncytial Virus by PCR: NEGATIVE
SARS Coronavirus 2 by RT PCR: NEGATIVE

## 2022-04-13 LAB — ETHANOL: Alcohol, Ethyl (B): <10 mg/dL (ref <10)

## 2022-04-13 LAB — MAGNESIUM
Magnesium: 2.1 mg/dL (ref 1.7–2.4)
Magnesium: 2.1 mg/dL (ref 1.7–2.4)

## 2022-04-13 LAB — GLUCOSE, CAPILLARY
Glucose-Capillary: 73 mg/dL (ref 70–99)
Glucose-Capillary: 93 mg/dL (ref 70–99)
Glucose-Capillary: 93 mg/dL (ref 70–99)
Glucose-Capillary: 97 mg/dL (ref 70–99)

## 2022-04-13 LAB — CBG MONITORING, ED: Glucose-Capillary: 92 mg/dL (ref 70–99)

## 2022-04-13 LAB — CK: Total CK: 326 U/L — ABNORMAL HIGH (ref 38–234)

## 2022-04-13 LAB — HIV ANTIBODY (ROUTINE TESTING W REFLEX): HIV Screen 4th Generation wRfx: NONREACTIVE

## 2022-04-13 LAB — I-STAT BETA HCG BLOOD, ED (MC, WL, AP ONLY): I-stat hCG, quantitative: <5 m[IU]/mL (ref <5)

## 2022-04-13 LAB — PHOSPHORUS
Phosphorus: 4.8 mg/dL — ABNORMAL HIGH (ref 2.5–4.6)
Phosphorus: 4 mg/dL (ref 2.5–4.6)

## 2022-04-13 LAB — SALICYLATE LEVEL: Salicylate Lvl: <7 mg/dL — ABNORMAL LOW (ref 7.0–30.0)

## 2022-04-13 LAB — MRSA NEXT GEN BY PCR, NASAL: MRSA by PCR Next Gen: NOT DETECTED

## 2022-04-13 MED ORDER — FENTANYL CITRATE PF 50 MCG/ML IJ SOSY
50.0000 ug | PREFILLED_SYRINGE | INTRAMUSCULAR | Status: DC | PRN
  Administered 2022-04-13 (×2): 200 ug via INTRAVENOUS
  Filled 2022-04-13: qty 4

## 2022-04-13 MED ORDER — ETOMIDATE 2 MG/ML IV SOLN
INTRAVENOUS | Status: AC
  Administered 2022-04-13: 20 mg via INTRAVENOUS
  Filled 2022-04-13: qty 20

## 2022-04-13 MED ORDER — ETOMIDATE 2 MG/ML IV SOLN: 20.0000 mg | Freq: Once | INTRAVENOUS | Status: AC

## 2022-04-13 MED ORDER — PROSOURCE TF20 ENFIT COMPATIBL EN LIQD
60.0000 mL | Freq: Every day | ENTERAL | Status: DC
  Administered 2022-04-13: 60 mL
  Filled 2022-04-13: qty 60

## 2022-04-13 MED ORDER — SODIUM CHLORIDE 0.9 % IV SOLN: INTRAVENOUS | Status: DC

## 2022-04-13 MED ORDER — ONDANSETRON HCL 4 MG/2ML IJ SOLN: 4.0000 mg | Freq: Four times a day (QID) | INTRAMUSCULAR | Status: DC | PRN

## 2022-04-13 MED ORDER — FENTANYL 2500MCG IN NS 250ML (10MCG/ML) PREMIX INFUSION
0.0000 ug/h | INTRAVENOUS | Status: DC
  Administered 2022-04-13: 25 ug/h via INTRAVENOUS

## 2022-04-13 MED ORDER — ONDANSETRON HCL 4 MG/2ML IJ SOLN
4.0000 mg | Freq: Once | INTRAMUSCULAR | Status: AC
  Filled 2022-04-13: qty 2

## 2022-04-13 MED ORDER — FENTANYL CITRATE PF 50 MCG/ML IJ SOSY
50.0000 ug | PREFILLED_SYRINGE | INTRAMUSCULAR | Status: DC | PRN
  Administered 2022-04-13 (×2): 50 ug via INTRAVENOUS

## 2022-04-13 MED ORDER — ENOXAPARIN SODIUM 30 MG/0.3ML IJ SOSY
30.0000 mg | PREFILLED_SYRINGE | INTRAMUSCULAR | Status: DC
  Administered 2022-04-13: 30 mg via SUBCUTANEOUS
  Filled 2022-04-13: qty 0.3

## 2022-04-13 MED ORDER — FENTANYL CITRATE PF 50 MCG/ML IJ SOSY
50.0000 ug | PREFILLED_SYRINGE | Freq: Once | INTRAMUSCULAR | Status: AC
  Administered 2022-04-13: 50 ug via INTRAVENOUS

## 2022-04-13 MED ORDER — POLYETHYLENE GLYCOL 3350 17 G PO PACK
17.0000 g | PACK | Freq: Every day | ORAL | Status: DC
  Administered 2022-04-13: 17 g
  Filled 2022-04-13: qty 1

## 2022-04-13 MED ORDER — PROSOURCE TF20 ENFIT COMPATIBL EN LIQD
60.0000 mL | Freq: Two times a day (BID) | ENTERAL | Status: DC
  Administered 2022-04-13: 60 mL
  Filled 2022-04-13: qty 60

## 2022-04-13 MED ORDER — ORAL CARE MOUTH RINSE: 15.0000 mL | OROMUCOSAL | Status: DC | PRN

## 2022-04-13 MED ORDER — ONDANSETRON HCL 4 MG/2ML IJ SOLN
INTRAMUSCULAR | Status: AC
  Administered 2022-04-13: 4 mg via INTRAVENOUS
  Filled 2022-04-13: qty 2

## 2022-04-13 MED ORDER — CHLORHEXIDINE GLUCONATE CLOTH 2 % EX PADS
6.0000 | MEDICATED_PAD | Freq: Every day | CUTANEOUS | Status: DC
  Administered 2022-04-13 – 2022-04-17 (×4): 6 via TOPICAL

## 2022-04-13 MED ORDER — MIDAZOLAM HCL 2 MG/2ML IJ SOLN: 2.0000 mg | INTRAMUSCULAR | Status: DC | PRN

## 2022-04-13 MED ORDER — FENTANYL BOLUS VIA INFUSION
50.0000 ug | INTRAVENOUS | Status: DC | PRN
  Administered 2022-04-13: 100 ug via INTRAVENOUS

## 2022-04-13 MED ORDER — ENOXAPARIN SODIUM 40 MG/0.4ML IJ SOSY
40.0000 mg | PREFILLED_SYRINGE | INTRAMUSCULAR | Status: DC
  Administered 2022-04-14 – 2022-04-17 (×4): 40 mg via SUBCUTANEOUS
  Filled 2022-04-13 (×4): qty 0.4

## 2022-04-13 MED ORDER — NALOXONE HCL 2 MG/2ML IJ SOSY
1.0000 mg | PREFILLED_SYRINGE | Freq: Once | INTRAMUSCULAR | Status: AC
  Filled 2022-04-13: qty 2

## 2022-04-13 MED ORDER — DOCUSATE SODIUM 50 MG/5ML PO LIQD: 100.0000 mg | Freq: Two times a day (BID) | ORAL | Status: DC | PRN

## 2022-04-13 MED ORDER — POLYETHYLENE GLYCOL 3350 17 G PO PACK: 17.0000 g | PACK | Freq: Every day | ORAL | Status: DC | PRN

## 2022-04-13 MED ORDER — FAMOTIDINE 20 MG PO TABS: 20.0000 mg | ORAL_TABLET | Freq: Two times a day (BID) | ORAL | Status: DC

## 2022-04-13 MED ORDER — VITAL 1.5 CAL PO LIQD
1000.0000 mL | ORAL | Status: DC
  Administered 2022-04-14: 1000 mL
  Filled 2022-04-13: qty 1000

## 2022-04-13 MED ORDER — ROCURONIUM BROMIDE 50 MG/5ML IV SOLN
INTRAVENOUS | Status: AC | PRN
  Administered 2022-04-13: 80 mg via INTRAVENOUS
  Administered 2022-04-13: 20 mg via INTRAVENOUS

## 2022-04-13 MED ORDER — FENTANYL 2500MCG IN NS 250ML (10MCG/ML) PREMIX INFUSION
50.0000 ug/h | INTRAVENOUS | Status: DC
  Administered 2022-04-13: 50 ug/h via INTRAVENOUS

## 2022-04-13 MED ORDER — TETANUS-DIPHTH-ACELL PERTUSSIS 5-2.5-18.5 LF-MCG/0.5 IM SUSY
0.5000 mL | PREFILLED_SYRINGE | Freq: Once | INTRAMUSCULAR | Status: AC
  Administered 2022-04-13: 0.5 mL via INTRAMUSCULAR
  Filled 2022-04-13: qty 0.5

## 2022-04-13 MED ORDER — ETOMIDATE 2 MG/ML IV SOLN
INTRAVENOUS | Status: AC | PRN
  Administered 2022-04-13: 20 mg via INTRAVENOUS

## 2022-04-13 MED ORDER — ROCURONIUM BROMIDE 10 MG/ML (PF) SYRINGE
PREFILLED_SYRINGE | INTRAVENOUS | Status: AC
  Administered 2022-04-13: 80 mg via INTRAVENOUS
  Filled 2022-04-13: qty 10

## 2022-04-13 MED ORDER — NALOXONE HCL 2 MG/2ML IJ SOSY
2.0000 mg | PREFILLED_SYRINGE | INTRAMUSCULAR | Status: DC | PRN
  Administered 2022-04-13: 2 mg via INTRAVENOUS

## 2022-04-13 MED ORDER — ROCURONIUM BROMIDE 10 MG/ML (PF) SYRINGE: 80.0000 mg | PREFILLED_SYRINGE | Freq: Once | INTRAVENOUS | Status: AC

## 2022-04-13 MED ORDER — FREE WATER
100.0000 mL | Status: DC
  Administered 2022-04-13 – 2022-04-14 (×5): 100 mL

## 2022-04-13 MED ORDER — ACETAMINOPHEN 160 MG/5ML PO SOLN
650.0000 mg | ORAL | Status: DC | PRN
  Administered 2022-04-14: 650 mg
  Filled 2022-04-13: qty 20.3

## 2022-04-13 MED ORDER — VITAL HIGH PROTEIN PO LIQD
1000.0000 mL | ORAL | Status: AC
  Administered 2022-04-13: 1000 mL

## 2022-04-13 MED ORDER — MIDAZOLAM HCL 2 MG/2ML IJ SOLN
2.0000 mg | Freq: Once | INTRAMUSCULAR | Status: AC
  Administered 2022-04-13: 2 mg via INTRAVENOUS

## 2022-04-13 MED ORDER — PROPOFOL 1000 MG/100ML IV EMUL
0.0000 ug/kg/min | INTRAVENOUS | Status: DC
  Administered 2022-04-13: 60 ug/kg/min via INTRAVENOUS
  Administered 2022-04-13 (×2): 50 ug/kg/min via INTRAVENOUS
  Administered 2022-04-13: 10 ug/kg/min via INTRAVENOUS
  Administered 2022-04-13: 50 ug/kg/min via INTRAVENOUS
  Administered 2022-04-13: 35 ug/kg/min via INTRAVENOUS
  Administered 2022-04-14 (×2): 60 ug/kg/min via INTRAVENOUS
  Filled 2022-04-13 (×7): qty 100

## 2022-04-13 MED ORDER — SODIUM CHLORIDE 0.9 % IV BOLUS
1000.0000 mL | Freq: Once | INTRAVENOUS | Status: AC
  Administered 2022-04-13: 1000 mL via INTRAVENOUS

## 2022-04-13 MED ORDER — NALOXONE HCL 2 MG/2ML IJ SOSY
1.0000 mg | PREFILLED_SYRINGE | Freq: Once | INTRAMUSCULAR | Status: AC
  Administered 2022-04-13: 1 mg via INTRAVENOUS

## 2022-04-13 MED ORDER — FAMOTIDINE 20 MG PO TABS
20.0000 mg | ORAL_TABLET | Freq: Two times a day (BID) | ORAL | Status: DC
  Administered 2022-04-13 (×2): 20 mg
  Filled 2022-04-13 (×2): qty 1

## 2022-04-13 MED ORDER — NALOXONE HCL 2 MG/2ML IJ SOSY
PREFILLED_SYRINGE | INTRAMUSCULAR | Status: AC
  Administered 2022-04-13: 1 mg via INTRAVENOUS
  Filled 2022-04-13: qty 2

## 2022-04-13 MED ORDER — MIDAZOLAM HCL 2 MG/2ML IJ SOLN
2.0000 mg | INTRAMUSCULAR | Status: DC | PRN
  Administered 2022-04-13: 2 mg via INTRAVENOUS
  Filled 2022-04-13: qty 2

## 2022-04-13 MED ORDER — ORAL CARE MOUTH RINSE
15.0000 mL | OROMUCOSAL | Status: DC
  Administered 2022-04-13 – 2022-04-14 (×12): 15 mL via OROMUCOSAL

## 2022-04-13 NOTE — TOC Progression Note (Signed)
Transition of Care [facility]) - Progression Note    Patient Details  Name: [name] MRN: [id number] Date of Birth: April 10, 1995  Transition of [facility]) CM/SW Contact  Henrietta Dine, RN Phone Number: 04/13/2022, 12:56 PM  Clinical Narrative:    No PCP and SDOH risk; pt intubated; no family in room; unable to complete assessment.        Expected Discharge Plan and Services                                               Social Determinants of Health (SDOH) Interventions SDOH Screenings   Food Insecurity: No Food Insecurity (08/22/2020)  Transportation Needs: Unmet Transportation Needs (08/22/2020)  Alcohol Screen: Medium Risk (04/10/2017)  Depression (PHQ2-9): Medium Risk (03/12/2021)  Tobacco Use: High Risk (04/13/2022)    Readmission Risk Interventions     No data to display
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
## 2022-04-13 NOTE — Progress Notes (Signed)
[place] Progress Note Patient Name: [name] DOB: May 13, 1995 MRN: [id number]   Date of Service  04/13/2022  HPI/Events of Note  Pt remains agitated. As nurses were trying to turn her in bed, she suddenly sat straight up, wouldn't lie back down, almost self-extubated.    eICU Interventions  Increased max dose of propofol to 61mcg/kg/min Increased frequency of PRN midazolam to q1h.  Will continue to uptitrate fentanyl drip.         [place] 04/13/2022, 11:19 PM

## 2022-04-13 NOTE — Progress Notes (Signed)
RT NOTE:  Pt transported from ED to ICU via ventilator with complications noted. BMV at bedside at all times during transport.
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
## 2022-04-13 NOTE — H&P (Signed)
NAME:  [name], MRN:  [id number], DOB:  [birth year], LOS: 0 ADMISSION DATE:  04/13/2022, CONSULTATION DATE:  04/13/2022  REFERRING MD:  Randal Buba, ED, CHIEF COMPLAINT:  overdose   History of Present Illness:  27 year old brought in by [employer] for  unresponsiveness.  She was arrested after a domestic dispute with mother  and taken to jail around 1 AM.  She possibly took cocaine, heroin or Suboxone or gabapentin, unknown time of use , was poorly responsive and brought to ED.  On arrival to ED, she was only responsive to sternal rub and had pinpoint pupils in the ED. given Narcan x2 , then intubated for airway protection . Labs significant for BUN/creatinine of 18/1.4, normal electrolytes, UDS positive for cocaine, benzodiazepines and opiates  Pertinent  Medical History  Opioid use disorder on Suboxone Generalized anxiety disorder Unclear why on gabapentin  Significant Hospital Events: Including procedures, antibiotic start and stop dates in addition to other pertinent events     Interim History / Subjective:  Afebrile  Objective   Blood pressure (!) 137/106, pulse 100, temperature 97.7 F (36.5 C), temperature source Axillary, resp. rate (!) 29, SpO2 100 %, not currently breastfeeding.        Intake/Output Summary (Last 24 hours) at 04/13/2022 0523 Last data filed at 04/13/2022 0515 Gross per 24 hour  Intake 1000 ml  Output --  Net 1000 ml   There were no vitals filed for this visit.  Examination: General: Young woman, orally intubated, sedated on fentanyl drip HENT: No pallor, icterus, no JVD Lungs: Clear to auscultation bilateral ventilated breath sounds, no accessory muscle use Cardiovascular: S1-S2 regular, no murmur Abdomen: Soft, nontender, no hepatosplenomegaly Extremities: No edema, no deformity Neuro: Sedate, RASS -4 on fentanyl drip, pupils 3 mm reactive to light   EKG reviewed shows normal QTc Chest x-ray shows left lower lobe atelectasis, ET tube in  position Head CT negative  Resolved Hospital Problem list     Assessment & Plan:  Overdose, possibly accidental but unable to rule out suicidal intent due to limited history -UDS positive for opiates, benzodiazepines and cocaine, possibly gabapentin Opiate use disorder -Suspicion for seizure is low but EDP reports some thrashing movements of legs even while unresponsive   -Vent orders reviewed and adjusted -Use propofol for sedation with intermittent fentanyl for breakthrough, goal RASS 0 to -1 -Hope to extubate once awake and calm -Low threshold to order EEG -Suicide watch once extubated until more history obtained  AKI -prerenal versus renal IV fluids @125 /h Obtain CK to rule out rhabdo   Best Practice (right click and "Reselect all SmartList Selections" daily)   Diet/type: NPO DVT prophylaxis: LMWH GI prophylaxis: H2B Lines: N/A Foley:  N/A Code Status:  full code Last date of multidisciplinary goals of care discussion [NA]  Labs   CBC: Recent Labs  Lab 04/13/22 0325  WBC 5.0  NEUTROABS 1.9  HGB 11.6*  HCT 36.5  MCV 81.8  PLT 99991111    Basic Metabolic Panel: Recent Labs  Lab 04/13/22 0325  NA 137  K 3.8  CL 104  CO2 23  GLUCOSE 83  BUN 18  CREATININE 1.43*  CALCIUM 9.3   GFR: CrCl cannot be calculated (Unknown ideal weight.). Recent Labs  Lab 04/13/22 0325  WBC 5.0    Liver Function Tests: Recent Labs  Lab 04/13/22 0325  AST 28  ALT 20  ALKPHOS 51  BILITOT 0.4  PROT 6.6  ALBUMIN 4.1   No results for input(s): "  LIPASE", "AMYLASE" in the last 168 hours. No results for input(s): "AMMONIA" in the last 168 hours.  ABG    Component Value Date/Time   TCO2 21 07/28/2012 1820     Coagulation Profile: No results for input(s): "INR", "PROTIME" in the last 168 hours.  Cardiac Enzymes: No results for input(s): "CKTOTAL", "CKMB", "CKMBINDEX", "TROPONINI" in the last 168 hours.  HbA1C: Hgb A1c MFr Bld  Date/Time Value Ref Range Status   08/26/2020 11:59 PM 5.1 4.8 - 5.6 % Final    Comment:    (NOTE) Pre diabetes:          5.7%-6.4%  Diabetes:              >6.4%  Glycemic control for   <7.0% adults with diabetes   02/14/2020 02:49 PM 5.0 4.8 - 5.6 % Final    Comment:             Prediabetes: 5.7 - 6.4          Diabetes: >6.4          Glycemic control for adults with diabetes: <7.0     CBG: Recent Labs  Lab 04/13/22 0329  GLUCAP 92    Review of Systems:   Unable to obtain since intubated and sedated  Past Medical History:  She,  has a past medical history of ADHD (attention deficit hyperactivity disorder), Anesthesia complication, Anxiety, Cholecystitis, Complication of anesthesia, Depression, Dysmenorrhea, Endometriosis, GERD (gastroesophageal reflux disease), Headache, Hyperlipidemia, Hypoglycemia, Polysubstance abuse ([place]), Pregnancy complicated by subutex maintenance, antepartum ([place]), Syncope, Viral warts, and Vision abnormalities.   Surgical History:   Past Surgical History:  Procedure Laterality Date   ADENOIDECTOMY     CHOLECYSTECTOMY  03/21/2011   Procedure: LAPAROSCOPIC CHOLECYSTECTOMY;  Surgeon: Harl Bowie, MD;  Location: [facility];  Service: General;  Laterality: N/A;   ESOPHAGOGASTRODUODENOSCOPY  09/26/2011   Procedure: ESOPHAGOGASTRODUODENOSCOPY (EGD);  Surgeon: Oletha Blend, MD;  Location: [facility];  Service: Gastroenterology;  Laterality: N/A;   TONSILLECTOMY AND ADENOIDECTOMY  06/2005   WISDOM TOOTH EXTRACTION       Social History:   reports that she has been smoking cigarettes. She has a 4.00 pack-year smoking history. She has never used smokeless tobacco. She reports that she does not currently use alcohol. She reports that she does not currently use drugs after having used the following drugs: Marijuana, Heroin, and Benzodiazepines.   Family History:  Her family history includes Anesthesia problems in her maternal grandfather; Anxiety disorder in her mother; Bipolar disorder in her  sister; COPD in her maternal grandmother; Cholelithiasis in her maternal aunt and mother; Depression in her maternal aunt and mother; Diabetes in her maternal grandfather; Gout in her father; Heart disease in her maternal grandfather and paternal grandfather; Hypertension in her mother; Learning disabilities in her maternal aunt; Mental illness in her maternal grandfather; 23 / [place] in her mother; Nephrolithiasis in her maternal grandfather, maternal grandmother, and mother.   Allergies Allergies  Allergen Reactions   Blueberry Fruit Extract Anaphylaxis   Contrast Media [Iodinated Contrast Media] Anaphylaxis and Rash   Codeine Hives, Itching and Nausea And Vomiting   Omnipaque [Iohexol] Itching, Nausea And Vomiting and Swelling     Home Medications  Prior to Admission medications   Medication Sig Start Date End Date Taking? Authorizing Provider  acetaminophen (TYLENOL) 325 MG tablet Take 2 tablets (650 mg total) by mouth every 4 (four) hours as needed (for pain scale < 4). 08/30/20   Erskine Emery, MD  gabapentin (NEURONTIN) 300 MG capsule Take 1 capsule (300 mg total) by mouth 3 (three) times daily. 03/26/21   Axel Filler, MD  hydrOXYzine (ATARAX) 25 MG tablet Take 1 tablet (25 mg total) by mouth every 12 (twelve) hours as needed. 03/26/21   Axel Filler, MD  metroNIDAZOLE (FLAGYL) 500 MG tablet Take 1 tablet (500 mg total) by mouth 2 (two) times daily. 03/29/21   Griffin Basil, MD  norethindrone (ORTHO MICRONOR) 0.35 MG tablet Take 1 tablet (0.35 mg total) by mouth daily. Patient not taking: Reported on 03/27/2021 09/15/20   Erskine Emery, MD  traZODone (DESYREL) 100 MG tablet Take 1 tablet (100 mg total) by mouth at bedtime. 02/12/21   Axel Filler, MD     Critical care time: 7m       Kara Mead MD. [facility]. [place] [facility] Pager : [phone]  If no response to pager , please call [phone] until 7 pm After 7:00 pm call  Elink  [phone]   04/13/2022
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
## 2022-04-13 NOTE — Progress Notes (Signed)
Per Dr. Ander Slade, continue fent drip. Slowly decrease fent throughout the day. Propofol drip on for sedation as well. Wean sedation as long as pt is stable.

## 2022-04-13 NOTE — Progress Notes (Signed)
[place] Progress Note Patient Name: [name] DOB: [birth year] MRN: [id number]   Date of Service  04/13/2022  HPI/Events of Note  Pt remains agitated despite max dose propofol and PRN fentanyl and versed.   eICU Interventions  Placed order to start fentanyl drip.         [place] 04/13/2022, 8:35 PM

## 2022-04-13 NOTE — Progress Notes (Signed)
NAME:  [name], MRN:  [id number], DOB:  02-27-1995, LOS: 0 ADMISSION DATE:  04/13/2022, CONSULTATION DATE:  04/13/2022  REFERRING MD:  Randal Buba, ED, CHIEF COMPLAINT:  overdose   History of Present Illness:  27 year old brought in by [employer] for  unresponsiveness.  She was arrested after a domestic dispute with mother  and taken to jail around 1 AM.  She possibly took cocaine, heroin or Suboxone or gabapentin, unknown time of use , was poorly responsive and brought to ED.  On arrival to ED, she was only responsive to sternal rub and had pinpoint pupils in the ED. given Narcan x2 , then intubated for airway protection . Labs significant for BUN/creatinine of 18/1.4, normal electrolytes, UDS positive for cocaine, benzodiazepines and opiates  Pertinent  Medical History  Opioid use disorder on Suboxone Generalized anxiety disorder Unclear why on gabapentin  Significant Hospital Events: Including procedures, antibiotic start and stop dates in addition to other pertinent events   Chest x-ray-left lower lobe infiltrate 3/17 intubated for respiratory failure, altered mental status  Interim History / Subjective:  Afebrile Appears comfortable at present  Objective   Blood pressure (!) 111/58, pulse 62, temperature 99 F (37.2 C), resp. rate 20, height 5\' 7"  (1.702 m), weight 87.5 kg, SpO2 100 %, not currently breastfeeding.    Vent Mode: PRVC FiO2 (%):  [40 %-100 %] 40 % Set Rate:  [16 bmp-20 bmp] 20 bmp Vt Set:  [490 mL-500 mL] 490 mL PEEP:  [5 cmH20] 5 cmH20 Plateau Pressure:  [15 cmH20-19 cmH20] 19 cmH20   Intake/Output Summary (Last 24 hours) at 04/13/2022 1156 Last data filed at 04/13/2022 1004 Gross per 24 hour  Intake 1745.08 ml  Output 875 ml  Net 870.08 ml   Filed Weights   04/13/22 0739 04/13/22 0834  Weight: 86.2 kg 87.5 kg    Examination: General: Young lady, does not appear to be in distress HENT: Moist oral mucosa, endotracheal tube in place Lungs: Clear breath  sounds anteriorly Cardiovascular: S1-S2 appreciated with no murmur Abdomen: Soft, bowel sounds appreciated Extremities: No clubbing, no edema Neuro: Sedated at present   EKG reviewed shows normal QTc Chest x-ray shows left lower lobe atelectasis, ET tube in position Head CT negative  Resolved Hospital Problem list     Assessment & Plan:   Substance use disorder Drug overdose -UDS positive for opiates, benzodiazepines, cocaine -Continue support  Acute hypoxemic respiratory failure -Continue mechanical ventilation -Target TVol 6-8cc/kgIBW -Target Plateau Pressure < 30cm H20 -Target driving pressure less than 15 cm of water -Target PaO2 55-65: titrate PEEP/FiO2 per protocol -Ventilator associated pneumonia prevention protocol  Agitation and sedation with fentanyl and propofol  Low threshold for EEG  Will need one-to-one monitoring following extubation  Acute kidney injury -Likely prerenal -Continue IV fluids  Best Practice (right click and "Reselect all SmartList Selections" daily)   Diet/type: tubefeeds DVT prophylaxis: LMWH GI prophylaxis: H2B Lines: N/A Foley:  N/A Code Status:  full code Last date of multidisciplinary goals of care discussion [NA]  Labs   CBC: Recent Labs  Lab 04/13/22 0325  WBC 5.0  NEUTROABS 1.9  HGB 11.6*  HCT 36.5  MCV 81.8  PLT 99991111    Basic Metabolic Panel: Recent Labs  Lab 04/13/22 0325 04/13/22 0914  NA 137  --   K 3.8  --   CL 104  --   CO2 23  --   GLUCOSE 83  --   BUN 18  --   CREATININE  1.43*  --   CALCIUM 9.3  --   MG  --  2.1  PHOS  --  4.8*   GFR: Estimated Creatinine Clearance: 68.4 mL/min (A) (by C-G formula based on SCr of 1.43 mg/dL (H)). Recent Labs  Lab 04/13/22 0325  WBC 5.0    Liver Function Tests: Recent Labs  Lab 04/13/22 0325  AST 28  ALT 20  ALKPHOS 51  BILITOT 0.4  PROT 6.6  ALBUMIN 4.1   No results for input(s): "LIPASE", "AMYLASE" in the last 168 hours. No results for  input(s): "AMMONIA" in the last 168 hours.  ABG    Component Value Date/Time   PHART 7.41 04/13/2022 0600   PCO2ART 36 04/13/2022 0600   PO2ART 489 (H) 04/13/2022 0600   HCO3 22.8 04/13/2022 0600   TCO2 21 07/28/2012 1820   ACIDBASEDEF 1.4 04/13/2022 0600   O2SAT 99.2 04/13/2022 0600     Coagulation Profile: No results for input(s): "INR", "PROTIME" in the last 168 hours.  Cardiac Enzymes: Recent Labs  Lab 04/13/22 0914  CKTOTAL 326*    HbA1C: Hgb A1c MFr Bld  Date/Time Value Ref Range Status  08/26/2020 11:59 PM 5.1 4.8 - 5.6 % Final    Comment:    (NOTE) Pre diabetes:          5.7%-6.4%  Diabetes:              >6.4%  Glycemic control for   <7.0% adults with diabetes   02/14/2020 02:49 PM 5.0 4.8 - 5.6 % Final    Comment:             Prediabetes: 5.7 - 6.4          Diabetes: >6.4          Glycemic control for adults with diabetes: <7.0     CBG: Recent Labs  Lab 04/13/22 0329  GLUCAP 92    Review of Systems:   Intubated and on sedation  Past Medical History:  She,  has a past medical history of ADHD (attention deficit hyperactivity disorder), Anesthesia complication, Anxiety, Cholecystitis, Complication of anesthesia, Depression, Dysmenorrhea, Endometriosis, GERD (gastroesophageal reflux disease), Headache, Hyperlipidemia, Hypoglycemia, Polysubstance abuse ([place]), Pregnancy complicated by subutex maintenance, antepartum ([place]), Syncope, Viral warts, and Vision abnormalities.   Surgical History:   Past Surgical History:  Procedure Laterality Date   ADENOIDECTOMY     CHOLECYSTECTOMY  03/21/2011   Procedure: LAPAROSCOPIC CHOLECYSTECTOMY;  Surgeon: Harl Bowie, MD;  Location: [place];  Service: General;  Laterality: N/A;   ESOPHAGOGASTRODUODENOSCOPY  09/26/2011   Procedure: ESOPHAGOGASTRODUODENOSCOPY (EGD);  Surgeon: Oletha Blend, MD;  Location: [place];  Service: Gastroenterology;  Laterality: N/A;   TONSILLECTOMY AND ADENOIDECTOMY  06/2005   WISDOM  TOOTH EXTRACTION       Social History:   reports that she has been smoking cigarettes. She has a 4.00 pack-year smoking history. She has never used smokeless tobacco. She reports that she does not currently use alcohol. She reports that she does not currently use drugs after having used the following drugs: Marijuana, Heroin, and Benzodiazepines.   Family History:  Her family history includes Anesthesia problems in her maternal grandfather; Anxiety disorder in her mother; Bipolar disorder in her sister; COPD in her maternal grandmother; Cholelithiasis in her maternal aunt and mother; Depression in her maternal aunt and mother; Diabetes in her maternal grandfather; Gout in her father; Heart disease in her maternal grandfather and paternal grandfather; Hypertension in her mother; Learning disabilities in her  maternal aunt; Mental illness in her maternal grandfather; Miscarriages / Stillbirths in her mother; Nephrolithiasis in her maternal grandfather, maternal grandmother, and mother.   Allergies Allergies  Allergen Reactions   Blueberry Fruit Extract Anaphylaxis   Contrast Media [Iodinated Contrast Media] Anaphylaxis and Rash   Codeine Hives, Itching and Nausea And Vomiting   Omnipaque [Iohexol] Itching, Nausea And Vomiting and Swelling     Home Medications  Prior to Admission medications   Medication Sig Start Date End Date Taking? Authorizing Provider  acetaminophen (TYLENOL) 325 MG tablet Take 2 tablets (650 mg total) by mouth every 4 (four) hours as needed (for pain scale < 4). 08/30/20   Erskine Emery, MD  gabapentin (NEURONTIN) 300 MG capsule Take 1 capsule (300 mg total) by mouth 3 (three) times daily. 03/26/21   Axel Filler, MD  hydrOXYzine (ATARAX) 25 MG tablet Take 1 tablet (25 mg total) by mouth every 12 (twelve) hours as needed. 03/26/21   Axel Filler, MD  metroNIDAZOLE (FLAGYL) 500 MG tablet Take 1 tablet (500 mg total) by mouth 2 (two) times daily. 03/29/21    Griffin Basil, MD  norethindrone (ORTHO MICRONOR) 0.35 MG tablet Take 1 tablet (0.35 mg total) by mouth daily. Patient not taking: Reported on 03/27/2021 09/15/20   Erskine Emery, MD  traZODone (DESYREL) 100 MG tablet Take 1 tablet (100 mg total) by mouth at bedtime. 02/12/21   Axel Filler, MD    The patient is critically ill with multiple organ systems failure and requires high complexity decision making for assessment and support, frequent evaluation and titration of therapies, application of advanced monitoring technologies and extensive interpretation of multiple databases. Critical Care Time devoted to patient care services described in this note independent of APP/resident time (if applicable)  is 30 minutes.   Sherrilyn Rist MD [facility] Personal pager: See Amion If unanswered, please page CCM On-call: [phone]

## 2022-04-13 NOTE — Progress Notes (Signed)
HR dropping to high 50s. Lowest was HR 58. Dr. Ander Slade aware. Titrate sedation

## 2022-04-13 NOTE — Progress Notes (Signed)
Patient transferred from ED to CT and back to ED without any issues. Patient tolerated well.

## 2022-04-13 NOTE — Progress Notes (Signed)
Initial Nutrition Assessment RD working remotely.  DOCUMENTATION CODES:   Not applicable  INTERVENTION:  - adjusted TF regimen: Vital 1.5 @ 20 ml/hr to advance by 10 ml/hr every 4 hours to reach goal rate of 50 ml/hr with 60 ml Prosource TF20 BID and 100 ml water every 4 hours.  - at goal rate, this regimen (without kcal from propofol) will provide 1960 kcal, 121 grams protein, and 1517 ml water.   NUTRITION DIAGNOSIS:   Inadequate oral intake related to inability to eat as evidenced by NPO status.  GOAL:   Patient will meet greater than or equal to 90% of their needs  MONITOR:   Vent status, TF tolerance, Labs, Weight trends  REASON FOR ASSESSMENT:   Ventilator, Consult Enteral/tube feeding initiation and management  ASSESSMENT:   27 year old female brought in by [employer] for unresponsiveness. She was arrested after a domestic dispute with mother and taken to jail around 1 AM. She possibly took cocaine, heroin or suboxone or gabapentin, unknown time of use. On arrival to ED, she was only responsive to sternal rub and had pinpoint pupils. She was given Narcan x2 then intubated for airway protection. UDS positive for cocaine, benzodiazepines, and opiates.  Patient remains intubated with OGT in place; gastric per abdominal x-ray on 3/17. Order in place for Vital High Protein @ 40 ml/hr with 60 ml Prosource TF20 once/day. This regimen provides 1040 kcal, 104 grams protein, and 802 ml water.   Weight today is 193 lb and weight is +10 lb compared to weight one year ago, on 03/27/21. No information documented in the edema section of flow sheet.   Patient is currently intubated on ventilator support MV: 9.3 L/min Temp (24hrs), Avg:98.9 F (37.2 C), Min:97.7 F (36.5 C), Max:99.4 F (37.4 C) Propofol: 25.9 ml/hr (684 kcal/24 hrs)  Labs reviewed; CBG: 93 mg/dl, Phos: 4.8 mg/dl, creatinine: 1.43 mg/dl, GFR: 52 ml/min.  Medications reviewed; 20 mg pepcid BID, 17 g miralax/day.  IVF;  NS @ 125 ml/hr.  Drips; fentanyl @ 50 mcg/hr, propocol @ 50 mcg/kg/min.    NUTRITION - FOCUSED PHYSICAL EXAM:  RD working remotely.  Diet Order:   Diet Order             Diet NPO time specified  Diet effective now                   EDUCATION NEEDS:   No education needs have been identified at this time  Skin:  Skin Assessment: Reviewed RN Assessment  Last BM:  PTA/unknown  Height:   Ht Readings from Last 1 Encounters:  04/13/22 5\' 7"  (1.702 m)    Weight:   Wt Readings from Last 1 Encounters:  04/13/22 87.5 kg    BMI:  Body mass index is 30.21 kg/m.  Estimated Nutritional Needs:  Kcal:  1910 kcal Protein:  105-131 grams Fluid:  >/= 2 L/day     Jarome Matin, MS, RD, LDN, CNSC Clinical Dietitian PRN/Relief staff On-call/weekend pager # available in [facility]

## 2022-04-14 ENCOUNTER — Inpatient Hospital Stay (HOSPITAL_COMMUNITY): Payer: Medicaid Other

## 2022-04-14 DIAGNOSIS — T40601A Poisoning by unspecified narcotics, accidental (unintentional), initial encounter: Secondary | ICD-10-CM | POA: Diagnosis not present

## 2022-04-14 LAB — CBC
HCT: 34.3 % — ABNORMAL LOW (ref 36.0–46.0)
Hemoglobin: 10.6 g/dL — ABNORMAL LOW (ref 12.0–15.0)
MCH: 26.1 pg (ref 26.0–34.0)
MCHC: 30.9 g/dL (ref 30.0–36.0)
MCV: 84.5 fL (ref 80.0–100.0)
Platelets: 121 10*3/uL — ABNORMAL LOW (ref 150–400)
RBC: 4.06 MIL/uL (ref 3.87–5.11)
RDW: 13.8 % (ref 11.5–15.5)
WBC: 5.7 10*3/uL (ref 4.0–10.5)
nRBC: 0 % (ref 0.0–0.2)

## 2022-04-14 LAB — BASIC METABOLIC PANEL
Anion gap: 7 (ref 5–15)
BUN: 19 mg/dL (ref 6–20)
CO2: 21 mmol/L — ABNORMAL LOW (ref 22–32)
Calcium: 7.5 mg/dL — ABNORMAL LOW (ref 8.9–10.3)
Chloride: 109 mmol/L (ref 98–111)
Creatinine, Ser: 0.77 mg/dL (ref 0.44–1.00)
GFR, Estimated: >60 mL/min (ref >60)
Glucose, Bld: 98 mg/dL (ref 70–99)
Potassium: 3.8 mmol/L (ref 3.5–5.1)
Sodium: 137 mmol/L (ref 135–145)

## 2022-04-14 LAB — GLUCOSE, CAPILLARY
Glucose-Capillary: 82 mg/dL (ref 70–99)
Glucose-Capillary: 110 mg/dL — ABNORMAL HIGH (ref 70–99)

## 2022-04-14 LAB — PHOSPHORUS
Phosphorus: 3 mg/dL (ref 2.5–4.6)
Phosphorus: 2.5 mg/dL (ref 2.5–4.6)

## 2022-04-14 LAB — MAGNESIUM
Magnesium: 2.1 mg/dL (ref 1.7–2.4)
Magnesium: 1.9 mg/dL (ref 1.7–2.4)

## 2022-04-14 LAB — TRIGLYCERIDES: Triglycerides: 63 mg/dL (ref <150)

## 2022-04-14 MED ORDER — ORAL CARE MOUTH RINSE: 15.0000 mL | OROMUCOSAL | Status: DC | PRN

## 2022-04-14 MED ORDER — ACETAMINOPHEN 160 MG/5ML PO SOLN
650.0000 mg | ORAL | Status: DC | PRN
  Administered 2022-04-15 (×2): 650 mg via ORAL
  Filled 2022-04-14 (×2): qty 20.3

## 2022-04-14 MED ORDER — ORAL CARE MOUTH RINSE: 15.0000 mL | OROMUCOSAL | Status: DC

## 2022-04-14 NOTE — Progress Notes (Signed)
Witnessed 40ML of fentanyl being wasted in seri-cycle by Rod Holler RN

## 2022-04-14 NOTE — Procedures (Signed)
Extubation Procedure Note  Patient Details:   Name: [name] DOB: 10/08/1995 MRN: [id number]   Airway Documentation:    Vent end date: 04/14/22 Vent end time: 0858   Evaluation  O2 sats: stable throughout Complications: No apparent complications Patient did tolerate procedure well. Bilateral Breath Sounds: Clear, Diminished   Yes  Baldwin [place] Nannette 04/14/2022, 9:01 AM  *Negative cuff leak pre extubation- CCM NP Kary Kos gave order to continue to extubate).  Placed PT on 2 lpm post extubation.

## 2022-04-14 NOTE — Progress Notes (Signed)
Fent drip wasted in med room. 40cc of fent wasted. Web designer verified amount wasted

## 2022-04-14 NOTE — Progress Notes (Signed)
Per Marni Griffon NP, remove foley.

## 2022-04-14 NOTE — Progress Notes (Addendum)
   NAME:  FELICIDAD HOGELAND, MRN:  [id number], DOB:  [birth year], LOS: 0 ADMISSION DATE:  04/13/2022, CONSULTATION DATE:  04/13/2022  REFERRING MD:  Randal Buba, ED, CHIEF COMPLAINT:  overdose   History of Present Illness:  27 year old brought in by [employer] for  unresponsiveness.  She was arrested after a domestic dispute with mother  and taken to jail around 1 AM.  She possibly took cocaine, heroin or Suboxone or gabapentin, unknown time of use , was poorly responsive and brought to ED.  On arrival to ED, she was only responsive to sternal rub and had pinpoint pupils in the ED. given Narcan x2 , then intubated for airway protection . Labs significant for BUN/creatinine of 18/1.4, normal electrolytes, UDS positive for cocaine, benzodiazepines and opiates  Pertinent  Medical History  Opioid use disorder on Suboxone Generalized anxiety disorder Unclear why on gabapentin  Significant Hospital Events: Including procedures, antibiotic start and stop dates in addition to other pertinent events   Chest x-ray-left lower lobe infiltrate 3/17 intubated for respiratory failure, altered mental status 3/17-3/18 fever. Passed sbt so extubated. Sputum sent  Interim History / Subjective:  Febrile last night   Objective   Blood pressure (!) 111/58, pulse 62, temperature 99 F (37.2 C), resp. rate 20, height 5\' 7"  (1.702 m), weight 87.5 kg, SpO2 100 %, not currently breastfeeding.    Vent Mode: PRVC FiO2 (%):  [40 %-100 %] 40 % Set Rate:  [16 bmp-20 bmp] 20 bmp Vt Set:  [490 mL-500 mL] 490 mL PEEP:  [5 cmH20] 5 cmH20 Plateau Pressure:  [15 cmH20-19 cmH20] 19 cmH20   Intake/Output Summary (Last 24 hours) at 04/13/2022 1156 Last data filed at 04/13/2022 1004 Gross per 24 hour  Intake 1745.08 ml  Output 875 ml  Net 870.08 ml   Filed Weights   04/13/22 0739 04/13/22 0834  Weight: 86.2 kg 87.5 kg    Examination: General 27 year old female now anxious but following commands HENT NCAT no JVD Pulm dec left  base no accessory use excellent VTs on PSV 5 Card tachy rrr Abd soft Ext warm  Neuro no focal def   Resolved Hospital Problem list     Assessment & Plan:     Acute hypoxemic respiratory failure w/ possible aspiration -cxr improved -passed SBT Plan Sputum culture  Dc sedation Extubate Pulse ox   Substance use disorder w/ Drug overdose -UDS positive for opiates, benzodiazepines, cocaine Plan Needs psych consult  Safety sitter  AKI Plan Cont IVFs F/u chem Strict I&O   Best Practice (right click and "Reselect all SmartList Selections" daily)   Diet/type: tubefeeds DVT prophylaxis: LMWH GI prophylaxis: H2B Lines: N/A Foley:  N/A Code Status:  full code Last date of multidisciplinary goals of care discussion [NA]  My cct 34 min  Erick Colace ACNP-BC [place] Pager # [phone] OR # [phone] if no answer

## 2022-04-14 NOTE — Consult Note (Signed)
[facility] Consult   Reason for Consult:  Overdose/accidental or unintentional  Referring Physician:  Ina Homes MD Patient Identification: [name] MRN:  [id number] Principal Diagnosis: Overdose opiate, accidental or unintentional, initial encounter [facility]) Diagnosis:  Principal Problem:   Overdose opiate, accidental or unintentional, initial encounter [facility])   Total Time spent with patient: 15 minutes  Subjective:   MAKAILEE Turner is a 27 y.o. female seen and evaluated face-to-face by this provider.  Photographer at bedside.  Patient presents irritable, tearful and is requesting to discharge.  " I just want to go home."  Patient is unable to recall the reason for this admission.  Currently, she  has denied suicidal or homicidal ideations currently.  Denied auditory or visual hallucinations.  Appears to be slightly confused with questions stated that she is currently in pain.  However, she did not pinpoint where her pain was located.    Francesca appears restless and agitated. Patient's responses are short and abrupt.  She denied that she is followed by therapy or psychiatry currently.  Denied previous inpatient admissions.  Denies that she is prescribed any psychotropic medications.  Patient was offered resources for substance abuse however, she declined. Stated that " I just want to go home."  Patient declined to allow this provider to contact her mother at this time for additional collateral. Denied that she has issues with substance abuse/use.   Chart reviewed, patient has a history with depression, anxiety and polysubstance abuse.  UDS+ Opiates, Cocaine and Benzodiazepines.  BAL -, AST/ALT 28/20. patient has home medication listed with gabapentin 300 mg p.o. 3 times daily, hydroxyzine 1 mg p.o. twice daily and trazodone 100 mg p.o. nightly-medication prescribed by  Internal Medicine,   Merrilyn Turner is sitting upright in bed; she  is alert/oriented x to self and place. Patient was asked,  Do you know where you are?  She stated " I don't know?"  Marigny presented flat and guarded and mood incongruent with affect. She is denying depression or depressive symptom. However, presented depressed, irritable and restless.   Patient is speaking in a clear tone at moderate volume, and normal pace; with minimal eye contact. Her thought process is coherent and relevant; There is no indication that she is currently responding to internal/external stimuli or experiencing delusional thought content.  Patient denies suicidal/self-harm/homicidal ideation, psychosis, and paranoia.  Patient has remained calm throughout assessment and has answered questions appropriately. Psychiatry to continue to follow.    [name] 27 year old female presented to the emergency department due to unresponsiveness due to a possible overdose.  Per chart patient was involved in a domestic dispute between she and her mother. Where is was reported that patient ingested cocaine,  heroin Suboxone or gabapentin on arrival she was only responsive to painful stimuli.  Received Narcan.  Patient was admitted to intensive care unit.    Per HPI; " 27 year old brought in by [employer] for  unresponsiveness.  She was arrested after a domestic dispute with mother  and taken to jail around 1 AM.  She possibly took cocaine, heroin or Suboxone or gabapentin, unknown time of use , was poorly responsive and brought to ED.  On arrival to ED, she was only responsive to sternal rub and had pinpoint pupils in the ED. given Narcan x2 , then intubated for airway protection . Labs significant for BUN/creatinine of 18/1.4, normal electrolytes, UDS positive for cocaine, benzodiazepines and opiate. "   Past  Psychiatric History:   Risk to Self:   Risk to Others:   Prior Inpatient Therapy:   Prior Outpatient Therapy:    Past Medical History:  Past Medical History:  Diagnosis Date   ADHD (attention  deficit hyperactivity disorder)    Anesthesia complication    woke up fighting after tonsillectomy   Anxiety    Cholecystitis    Complication of anesthesia    when wakes up " freaks out- like panic attack"   Depression    Dysmenorrhea    Endometriosis    GERD (gastroesophageal reflux disease)    Headache    migraines   Hyperlipidemia    Hypoglycemia    Polysubstance abuse ([place])    Pregnancy complicated by subutex maintenance, antepartum ([place])    Syncope    Viral warts    hand   Vision abnormalities    wears glasses    Past Surgical History:  Procedure Laterality Date   ADENOIDECTOMY     CHOLECYSTECTOMY  03/21/2011   Procedure: LAPAROSCOPIC CHOLECYSTECTOMY;  Surgeon: Harl Bowie, MD;  Location: [facility];  Service: General;  Laterality: N/A;   ESOPHAGOGASTRODUODENOSCOPY  09/26/2011   Procedure: ESOPHAGOGASTRODUODENOSCOPY (EGD);  Surgeon: Oletha Blend, MD;  Location: [facility];  Service: Gastroenterology;  Laterality: N/A;   TONSILLECTOMY AND ADENOIDECTOMY  06/2005   WISDOM TOOTH EXTRACTION     Family History:  Family History  Problem Relation Age of Onset   Anesthesia problems Maternal Grandfather    Heart disease Maternal Grandfather    Nephrolithiasis Maternal Grandfather    Diabetes Maternal Grandfather    Mental illness Maternal Grandfather    Cholelithiasis Mother    Nephrolithiasis Mother    Depression Mother    Hypertension Mother    Miscarriages / Stillbirths Mother    Anxiety disorder Mother    Gout Father    Nephrolithiasis Maternal Grandmother    COPD Maternal Grandmother    Heart disease Paternal Grandfather    Cholelithiasis Maternal Aunt    Depression Maternal Aunt    Learning disabilities Maternal Aunt    Bipolar disorder Sister    Family Psychiatric  History:  Social History:  Social History   Substance and Sexual Activity  Alcohol Use Not Currently   Comment: socially     Social History   Substance and Sexual Activity  Drug Use Not  Currently   Types: Marijuana, Heroin, Benzodiazepines   Comment: last used 11/2019     Social History   Socioeconomic History   Marital status: Single    Spouse name: Not on file   Number of children: Not on file   Years of education: Not on file   Highest education level: Not on file  Occupational History   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  Tobacco Use   Smoking status: Every Day    Packs/day: 1.00    Years: 4.00    Additional pack years: 0.00    Total pack years: 4.00    Types: Cigarettes   Smokeless tobacco: Never   Tobacco comments:    0.5 PPD  Vaping Use   Vaping Use: Never used  Substance and Sexual Activity   Alcohol use: Not Currently    Comment: socially   Drug use: Not Currently    Types: Marijuana, Heroin, Benzodiazepines    Comment: last used 11/2019    Sexual activity: Yes    Birth control/protection: None  Other Topics Concern   Not on file  Social  History Narrative   Repeating 9th grade (missed 40 days last year despite homebound instruction)   Social Determinants of Health   Financial Resource Strain: Not on file  Food Insecurity: No Food Insecurity (08/22/2020)   Hunger Vital Sign    Worried About Running Out of Food in the Last Year: Never true    Ran Out of Food in the Last Year: Never true  Transportation Needs: Unmet Transportation Needs (08/22/2020)   PRAPARE - Hydrologist (Medical): Yes    Lack of Transportation (Non-Medical): Yes  Physical Activity: Not on file  Stress: Not on file  Social Connections: Not on file   Additional Social History:    Allergies:   Allergies  Allergen Reactions   Blueberry Fruit Extract Anaphylaxis   Contrast Media [Iodinated Contrast Media] Anaphylaxis and Rash   Codeine Hives, Itching and Nausea And Vomiting   Omnipaque [Iohexol] Itching, Nausea And Vomiting and Swelling    Labs:  Results for orders placed or performed during the hospital encounter  of 04/13/22 (from the past 48 hour(s))  Comprehensive metabolic panel     Status: Abnormal   Collection Time: 04/13/22  3:25 AM  Result Value Ref Range   Sodium 137 135 - 145 mmol/L   Potassium 3.8 3.5 - 5.1 mmol/L   Chloride 104 98 - 111 mmol/L   CO2 23 22 - 32 mmol/L   Glucose, Bld 83 70 - 99 mg/dL    Comment: Glucose reference range applies only to samples taken after fasting for at least 8 hours.   BUN 18 6 - 20 mg/dL   Creatinine, Ser 1.43 (H) 0.44 - 1.00 mg/dL   Calcium 9.3 8.9 - 10.3 mg/dL   Total Protein 6.6 6.5 - 8.1 g/dL   Albumin 4.1 3.5 - 5.0 g/dL   AST 28 15 - 41 U/L   ALT 20 0 - 44 U/L   Alkaline Phosphatase 51 38 - 126 U/L   Total Bilirubin 0.4 0.3 - 1.2 mg/dL   GFR, Estimated 52 (L) >60 mL/min    Comment: (NOTE) Calculated using the CKD-EPI Creatinine Equation (2021)    Anion gap 10 5 - 15    Comment: Performed at [facility], [place] [address], [place], [place] [id number]  Salicylate level     Status: Abnormal   Collection Time: 04/13/22  3:25 AM  Result Value Ref Range   Salicylate Lvl [id number] (L) 7.0 - 30.0 mg/dL    Comment: Performed at [facility], [place] [address] [place], [place] [address]  Acetaminophen level     Status: Abnormal   Collection Time: 04/13/22  3:25 AM  Result Value Ref Range   Acetaminophen (Tylenol), Serum <10 (L) 10 - 30 ug/mL    Comment: (NOTE) Therapeutic concentrations vary significantly. A range of 10-30 ug/mL  may be an effective concentration for many patients. However, some  are best treated at concentrations outside of this range. Acetaminophen concentrations >150 ug/mL at 4 hours after ingestion  and >50 ug/mL at 12 hours after ingestion are often associated with  toxic reactions.  Performed at [facility], [place] [address], [place], [place] [address]   Ethanol     Status: None   Collection Time: 04/13/22  3:25 AM  Result Value Ref Range   Alcohol, Ethyl (B) <10 <10  mg/dL    Comment: (NOTE) Lowest detectable limit for serum alcohol is 10 mg/dL.  For medical purposes only. Performed  at [facility], [place] [address] [place], [place] [address]   CBC WITH DIFFERENTIAL     Status: Abnormal   Collection Time: 04/13/22  3:25 AM  Result Value Ref Range   WBC 5.0 4.0 - 10.5 K/uL   RBC 4.46 3.87 - 5.11 MIL/uL   Hemoglobin 11.6 (L) 12.0 - 15.0 g/dL   HCT 36.5 36.0 - 46.0 %   MCV 81.8 80.0 - 100.0 fL   MCH 26.0 26.0 - 34.0 pg   MCHC 31.8 30.0 - 36.0 g/dL   RDW 13.6 11.5 - 15.5 %   Platelets 191 150 - 400 K/uL   nRBC 0.0 0.0 - 0.2 %   Neutrophils Relative % 39 %   Neutro Abs 1.9 1.7 - 7.7 K/uL   Lymphocytes Relative 42 %   Lymphs Abs 2.1 0.7 - 4.0 K/uL   Monocytes Relative 10 %   Monocytes Absolute 0.5 0.1 - 1.0 K/uL   Eosinophils Relative 8 %   Eosinophils Absolute 0.4 0.0 - 0.5 K/uL   Basophils Relative 1 %   Basophils Absolute 0.0 0.0 - 0.1 K/uL   Immature Granulocytes 0 %   Abs Immature Granulocytes 0.01 0.00 - 0.07 K/uL    Comment: Performed at [facility], [place] [address], [place], [place] [address]  CBG monitoring, ED     Status: None   Collection Time: 04/13/22  3:29 AM  Result Value Ref Range   Glucose-Capillary 92 70 - 99 mg/dL    Comment: Glucose reference range applies only to samples taken after fasting for at least 8 hours.  Resp panel by RT-PCR (RSV, Flu A&B, Covid) Anterior Nasal Swab     Status: None   Collection Time: 04/13/22  3:33 AM   Specimen: Anterior Nasal Swab  Result Value Ref Range   SARS Coronavirus 2 by RT PCR NEGATIVE NEGATIVE    Comment: (NOTE) SARS-CoV-2 target nucleic acids are NOT DETECTED.  The SARS-CoV-2 RNA is generally detectable in upper respiratory specimens during the acute phase of infection. The lowest concentration of SARS-CoV-2 viral copies this assay can detect is 138 copies/mL. A negative result does not preclude SARS-Cov-2 infection and should not be used  as the sole basis for treatment or other patient management decisions. A negative result may occur with  improper specimen collection/handling, submission of specimen other than nasopharyngeal swab, presence of viral mutation(s) within the areas targeted by this assay, and inadequate number of viral copies(<138 copies/mL). A negative result must be combined with clinical observations, patient history, and epidemiological information. The expected result is Negative.  Fact Sheet for Patients:  EntrepreneurPulse.com.au  Fact Sheet for Healthcare Providers:  IncredibleEmployment.be  This test is no t yet approved or cleared by the [place] FDA and  has been authorized for detection and/or diagnosis of SARS-CoV-2 by FDA under an Emergency Use Authorization (EUA). This EUA will remain  in effect (meaning this test can be used) for the duration of the COVID-19 declaration under Section 564(b)(1) of the Act, 21 U.S.C.section 360bbb-3(b)(1), unless the authorization is terminated  or revoked sooner.       Influenza A by PCR NEGATIVE NEGATIVE   Influenza B by PCR NEGATIVE NEGATIVE    Comment: (NOTE) The Xpert Xpress SARS-CoV-2/FLU/RSV plus assay is intended as an aid in the diagnosis of influenza from Nasopharyngeal swab specimens and should not be used as a sole basis for treatment. Nasal washings and aspirates are unacceptable for Xpert Xpress SARS-CoV-2/FLU/RSV testing.  Fact Sheet for Patients: EntrepreneurPulse.com.au  Fact Sheet for Healthcare Providers: IncredibleEmployment.be  This test is not yet approved or cleared by the [place] FDA and has been authorized for detection and/or diagnosis of SARS-CoV-2 by FDA under an Emergency Use Authorization (EUA). This EUA will remain in effect (meaning this test can be used) for the duration of the COVID-19 declaration under Section 564(b)(1) of the Act,  21 U.S.C. section 360bbb-3(b)(1), unless the authorization is terminated or revoked.     Resp Syncytial Virus by PCR NEGATIVE NEGATIVE    Comment: (NOTE) Fact Sheet for Patients: EntrepreneurPulse.com.au  Fact Sheet for Healthcare Providers: IncredibleEmployment.be  This test is not yet approved or cleared by the [place] FDA and has been authorized for detection and/or diagnosis of SARS-CoV-2 by FDA under an Emergency Use Authorization (EUA). This EUA will remain in effect (meaning this test can be used) for the duration of the COVID-19 declaration under Section 564(b)(1) of the Act, 21 U.S.C. section 360bbb-3(b)(1), unless the authorization is terminated or revoked.  Performed at [facility], [place] [address] [place], [place] [address]   I-Stat beta hCG blood, ED     Status: None   Collection Time: 04/13/22  3:50 AM  Result Value Ref Range   I-stat hCG, quantitative <5.0 <5 mIU/mL   Comment 3            Comment:   GEST. AGE      CONC.  (mIU/mL)   <=1 WEEK        5 - 50     2 WEEKS       50 - 500     3 WEEKS       100 - 10,000     4 WEEKS     1,000 - 30,000        FEMALE AND NON-PREGNANT FEMALE:     LESS THAN 5 mIU/mL   Urine rapid drug screen (hosp performed)     Status: Abnormal   Collection Time: 04/13/22  4:09 AM  Result Value Ref Range   Opiates POSITIVE (A) NONE DETECTED   Cocaine POSITIVE (A) NONE DETECTED   Benzodiazepines POSITIVE (A) NONE DETECTED   Amphetamines NONE DETECTED NONE DETECTED   Tetrahydrocannabinol NONE DETECTED NONE DETECTED   Barbiturates NONE DETECTED NONE DETECTED    Comment: (NOTE) DRUG SCREEN FOR MEDICAL PURPOSES ONLY.  IF CONFIRMATION IS NEEDED FOR ANY PURPOSE, NOTIFY LAB WITHIN 5 DAYS.  LOWEST DETECTABLE LIMITS FOR URINE DRUG SCREEN Drug Class                     Cutoff (ng/mL) Amphetamine and metabolites    1000 Barbiturate and metabolites    200 Benzodiazepine                  200 Opiates and metabolites        300 Cocaine and metabolites        300 THC                            50 Performed at [facility], [place] [address] [place], [place] [address]   Blood gas, arterial     Status: Abnormal   Collection Time: 04/13/22  6:00 AM  Result Value Ref Range   FIO2 100 %   Mode PRESSURE REGULATED VOLUME CONTROL    MECHVT 500 mL   RATE 16 resp/min  PEEP 5 cm H20   pH, Arterial 7.41 7.35 - 7.45   pCO2 arterial 36 32 - 48 mmHg   pO2, Arterial 489 (H) 83 - 108 mmHg   Bicarbonate 22.8 20.0 - 28.0 mmol/L   Acid-base deficit 1.4 0.0 - 2.0 mmol/L   O2 Saturation 99.2 %   Patient temperature 37.0    Collection site RIGHT RADIAL    Drawn by [id number]    Allens test (pass/fail) PASS PASS    Comment: Performed at [facility], [place] [address], [place], [place] [address]  MRSA Next Gen by PCR, Nasal     Status: None   Collection Time: 04/13/22  8:41 AM   Specimen: Nasal Mucosa; Nasal Swab  Result Value Ref Range   MRSA by PCR Next Gen NOT DETECTED NOT DETECTED    Comment: (NOTE) The GeneXpert MRSA Assay (FDA approved for NASAL specimens only), is one component of a comprehensive MRSA colonization surveillance program. It is not intended to diagnose MRSA infection nor to guide or monitor treatment for MRSA infections. Test performance is not FDA approved in patients less than 90 years old. Performed at [facility], [place] [address], [place], [place] [address]   HIV Antibody (routine testing w rflx)     Status: None   Collection Time: 04/13/22  9:14 AM  Result Value Ref Range   HIV Screen 4th Generation wRfx Non Reactive Non Reactive    Comment: Performed at [facility], [place] [address] [place], [place] [address]  CK     Status: Abnormal   Collection Time: 04/13/22  9:14 AM  Result Value Ref Range   Total CK 326 (H) 38 - 234 U/L    Comment: Performed at [facility], [place]  [address] [place], [place] [address]  Magnesium     Status: None   Collection Time: 04/13/22  9:14 AM  Result Value Ref Range   Magnesium 2.1 1.7 - 2.4 mg/dL    Comment: Performed at [facility], [place] [address] [place], [place] [address]  Phosphorus     Status: Abnormal   Collection Time: 04/13/22  9:14 AM  Result Value Ref Range   Phosphorus 4.8 (H) 2.5 - 4.6 mg/dL    Comment: Performed at [facility], [place] [address] [place], [place] [address]  Glucose, capillary     Status: None   Collection Time: 04/13/22 12:05 PM  Result Value Ref Range   Glucose-Capillary 93 70 - 99 mg/dL    Comment: Glucose reference range applies only to samples taken after fasting for at least 8 hours.  Glucose, capillary     Status: None   Collection Time: 04/13/22  3:45 PM  Result Value Ref Range   Glucose-Capillary 93 70 - 99 mg/dL    Comment: Glucose reference range applies only to samples taken after fasting for at least 8 hours.  Magnesium     Status: None   Collection Time: 04/13/22  4:49 PM  Result Value Ref Range   Magnesium 2.1 1.7 - 2.4 mg/dL    Comment: Performed at [facility], [place] [address], [place], [place] [address]  Phosphorus     Status: None   Collection Time: 04/13/22  4:49 PM  Result Value Ref Range   Phosphorus 4.0 2.5 - 4.6 mg/dL    Comment: Performed at [facility], [place] [address], [place], [place] [address]  Glucose, capillary  Status: None   Collection Time: 04/13/22  8:23 PM  Result Value Ref Range   Glucose-Capillary 73 70 - 99 mg/dL    Comment: Glucose reference range applies only to samples taken after fasting for at least 8 hours.   Comment 1 Notify RN   Glucose, capillary     Status: None   Collection Time: 04/13/22 11:49 PM  Result Value Ref Range   Glucose-Capillary 97 70 - 99 mg/dL    Comment: Glucose reference range applies only to samples taken after fasting for at least 8 hours.   Glucose, capillary     Status: None   Collection Time: 04/14/22  3:18 AM  Result Value Ref Range   Glucose-Capillary 82 70 - 99 mg/dL    Comment: Glucose reference range applies only to samples taken after fasting for at least 8 hours.  Glucose, capillary     Status: Abnormal   Collection Time: 04/14/22  8:19 AM  Result Value Ref Range   Glucose-Capillary 110 (H) 70 - 99 mg/dL    Comment: Glucose reference range applies only to samples taken after fasting for at least 8 hours.   Comment 1 Notify RN    Comment 2 Document in Chart   Triglycerides     Status: None   Collection Time: 04/14/22  8:22 AM  Result Value Ref Range   Triglycerides 63 <150 mg/dL    Comment: Performed at [facility], [place] [address] [place], [place] [address]  CBC     Status: Abnormal   Collection Time: 04/14/22  8:33 AM  Result Value Ref Range   WBC 5.7 4.0 - 10.5 K/uL   RBC 4.06 3.87 - 5.11 MIL/uL   Hemoglobin 10.6 (L) 12.0 - 15.0 g/dL   HCT 34.3 (L) 36.0 - 46.0 %   MCV 84.5 80.0 - 100.0 fL   MCH 26.1 26.0 - 34.0 pg   MCHC 30.9 30.0 - 36.0 g/dL   RDW 13.8 11.5 - 15.5 %   Platelets 121 (L) 150 - 400 K/uL   nRBC 0.0 0.0 - 0.2 %    Comment: Performed at [facility], [place] [address], [place], [place] [id number]  Basic metabolic panel     Status: Abnormal   Collection Time: 04/14/22  8:33 AM  Result Value Ref Range   Sodium 137 135 - 145 mmol/L   Potassium 3.8 3.5 - 5.1 mmol/L   Chloride 109 98 - 111 mmol/L   CO2 21 (L) 22 - 32 mmol/L   Glucose, Bld 98 70 - 99 mg/dL    Comment: Glucose reference range applies only to samples taken after fasting for at least 8 hours.   BUN 19 6 - 20 mg/dL   Creatinine, Ser 0.77 0.44 - 1.00 mg/dL   Calcium 7.5 (L) 8.9 - 10.3 mg/dL   GFR, Estimated >60 >60 mL/min    Comment: (NOTE) Calculated using the CKD-EPI Creatinine Equation (2021)    Anion gap 7 5 - 15    Comment: Performed at [facility], [place] [address] [place], [place] [address]  Magnesium     Status: None   Collection Time: 04/14/22  8:33 AM  Result Value Ref Range   Magnesium 2.1 1.7 - 2.4 mg/dL    Comment: Performed at [facility], [place] [address], [place], [place] [address]  Phosphorus     Status: None   Collection Time: 04/14/22  8:33 AM  Result Value Ref Range  Phosphorus 3.0 2.5 - 4.6 mg/dL    Comment: Performed at [facility], [place] [address], [place], [place] [address]    Current Facility-Administered Medications  Medication Dose Route Frequency Provider Last Rate Last Admin   0.9 %  sodium chloride infusion   Intravenous Continuous Palumbo, April, MD 125 mL/hr at 04/14/22 0834 Infusion Verify at 04/14/22 0834   acetaminophen (TYLENOL) 160 MG/5ML solution 650 mg  650 mg Per Tube Q4H PRN Rigoberto Noel, MD   650 mg at 04/14/22 0734   Chlorhexidine Gluconate Cloth 2 % PADS 6 each  6 each Topical Daily Olalere, Adewale A, MD   6 each at 04/13/22 0842   enoxaparin (LOVENOX) injection 40 mg  40 mg Subcutaneous Q24H Tawnya Crook, RPH   40 mg at 04/14/22 0957   ondansetron (ZOFRAN) injection 4 mg  4 mg Intravenous Q6H PRN Rigoberto Noel, MD       Oral care mouth rinse  15 mL Mouth Rinse 4 times per day Candee Furbish, MD       Oral care mouth rinse  15 mL Mouth Rinse PRN Candee Furbish, MD        Musculoskeletal:   Psychiatric Specialty Exam:  Presentation  General Appearance:  Disheveled  Eye Contact: Minimal  Speech: Clear and Coherent  Speech Volume: Normal  Handedness: Right   Mood and Affect  Mood: Depressed  Affect: Blunt   Thought Process  Thought Processes: Coherent  Descriptions of Associations:Intact  Orientation:Partial  Thought Content:Logical  History of Schizophrenia/Schizoaffective disorder:No data recorded Duration of Psychotic Symptoms:No data recorded Hallucinations:Hallucinations: None  Ideas of Reference:None  Suicidal  Thoughts:Suicidal Thoughts: No  Homicidal Thoughts:Homicidal Thoughts: No   Sensorium  Memory: Immediate Fair; Recent Fair; Remote Fair  Judgment: Fair  Insight: Good   Executive Functions  Concentration: Fair  Attention Span: Fair  Recall: Poor  Fund of Knowledge: Good  Language: Good   Psychomotor Activity  Psychomotor Activity: Psychomotor Activity: Restlessness   Assets  Assets: Social Support   Sleep  Sleep: Sleep: Fair   Physical Exam: Physical Exam Vitals and nursing note reviewed.  Constitutional:      Appearance: Normal appearance.  Neurological:     Mental Status: She is alert.  Psychiatric:        Mood and Affect: Mood normal.        Behavior: Behavior normal.    Review of Systems  Psychiatric/Behavioral:  Positive for depression.   All other systems reviewed and are negative.  Blood pressure 131/73, pulse (!) 108, temperature (!) 101.5 F (38.6 C), resp. rate (!) 28, height 5\' 7"  (1.702 m), weight 91 kg, SpO2 97 %, not currently breastfeeding. Body mass index is 31.42 kg/m.  Disposition:  Currently under the custody of [facility] [employer] due to reported domestic dispute Patient was offered additional outpatient resources for therapy for psychiatry and substance abuse however she declined at this time  Psychiatry to continue to follow   Derrill Center, NP 04/14/2022 10:59 AM

## 2022-04-15 DIAGNOSIS — T40601A Poisoning by unspecified narcotics, accidental (unintentional), initial encounter: Secondary | ICD-10-CM | POA: Diagnosis not present

## 2022-04-15 LAB — PROCALCITONIN: Procalcitonin: <0.1 ng/mL

## 2022-04-15 LAB — BASIC METABOLIC PANEL
Anion gap: 7 (ref 5–15)
BUN: 8 mg/dL (ref 6–20)
CO2: 20 mmol/L — ABNORMAL LOW (ref 22–32)
Calcium: 7.4 mg/dL — ABNORMAL LOW (ref 8.9–10.3)
Chloride: 109 mmol/L (ref 98–111)
Creatinine, Ser: 0.63 mg/dL (ref 0.44–1.00)
GFR, Estimated: >60 mL/min (ref >60)
Glucose, Bld: 180 mg/dL — ABNORMAL HIGH (ref 70–99)
Potassium: 2.8 mmol/L — ABNORMAL LOW (ref 3.5–5.1)
Sodium: 136 mmol/L (ref 135–145)

## 2022-04-15 LAB — PHOSPHORUS: Phosphorus: 1.2 mg/dL — ABNORMAL LOW (ref 2.5–4.6)

## 2022-04-15 LAB — CK: Total CK: 456 U/L — ABNORMAL HIGH (ref 38–234)

## 2022-04-15 LAB — MAGNESIUM: Magnesium: 1.9 mg/dL (ref 1.7–2.4)

## 2022-04-15 MED ORDER — ALPRAZOLAM 0.5 MG PO TABS
1.0000 mg | ORAL_TABLET | Freq: Once | ORAL | Status: AC
  Administered 2022-04-15: 1 mg via ORAL
  Filled 2022-04-15: qty 2

## 2022-04-15 MED ORDER — GUAIFENESIN 100 MG/5ML PO LIQD
5.0000 mL | ORAL | Status: DC | PRN
  Administered 2022-04-15 – 2022-04-16 (×4): 5 mL via ORAL
  Filled 2022-04-15 (×4): qty 10

## 2022-04-15 MED ORDER — HYDROCORTISONE 1 % EX CREA: 1.0000 | TOPICAL_CREAM | Freq: Three times a day (TID) | CUTANEOUS | Status: DC | PRN

## 2022-04-15 MED ORDER — PHENOL 1.4 % MT LIQD
1.0000 | OROMUCOSAL | Status: DC | PRN
  Administered 2022-04-15 (×3): 1 via OROMUCOSAL
  Filled 2022-04-15: qty 177

## 2022-04-15 MED ORDER — DIPHENHYDRAMINE HCL 50 MG/ML IJ SOLN
50.0000 mg | Freq: Once | INTRAMUSCULAR | Status: AC
  Administered 2022-04-15: 50 mg via INTRAVENOUS
  Filled 2022-04-15: qty 1

## 2022-04-15 MED ORDER — RACEPINEPHRINE HCL 2.25 % IN NEBU
0.5000 mL | INHALATION_SOLUTION | RESPIRATORY_TRACT | Status: DC | PRN
  Administered 2022-04-15: 0.5 mL via RESPIRATORY_TRACT
  Filled 2022-04-15: qty 0.5

## 2022-04-15 MED ORDER — GABAPENTIN 300 MG PO CAPS
600.0000 mg | ORAL_CAPSULE | Freq: Two times a day (BID) | ORAL | Status: DC
  Administered 2022-04-15 – 2022-04-17 (×5): 600 mg via ORAL
  Filled 2022-04-15 (×5): qty 2

## 2022-04-15 MED ORDER — DIPHENHYDRAMINE HCL 25 MG PO CAPS
25.0000 mg | ORAL_CAPSULE | Freq: Four times a day (QID) | ORAL | Status: AC | PRN
  Administered 2022-04-15 – 2022-04-16 (×2): 25 mg via ORAL
  Filled 2022-04-15 (×2): qty 1

## 2022-04-15 MED ORDER — POTASSIUM CHLORIDE 10 MEQ/100ML IV SOLN
10.0000 meq | INTRAVENOUS | Status: DC
  Administered 2022-04-15 (×2): 10 meq via INTRAVENOUS
  Filled 2022-04-15 (×4): qty 100

## 2022-04-15 MED ORDER — METHYLPREDNISOLONE SODIUM SUCC 40 MG IJ SOLR
40.0000 mg | Freq: Three times a day (TID) | INTRAMUSCULAR | Status: DC
  Administered 2022-04-15 – 2022-04-16 (×2): 40 mg via INTRAVENOUS
  Filled 2022-04-15 (×2): qty 1

## 2022-04-15 MED ORDER — ALUM & MAG HYDROXIDE-SIMETH 200-200-20 MG/5ML PO SUSP: 30.0000 mL | ORAL | Status: DC | PRN

## 2022-04-15 MED ORDER — IPRATROPIUM-ALBUTEROL 0.5-2.5 (3) MG/3ML IN SOLN
3.0000 mL | RESPIRATORY_TRACT | Status: DC | PRN
  Administered 2022-04-15 (×2): 3 mL via RESPIRATORY_TRACT
  Filled 2022-04-15 (×2): qty 3

## 2022-04-15 MED ORDER — HYDROCORTISONE (PERIANAL) 2.5 % EX CREA: 1.0000 | TOPICAL_CREAM | Freq: Four times a day (QID) | CUTANEOUS | Status: DC | PRN

## 2022-04-15 MED ORDER — LIP MEDEX EX OINT: 1.0000 | TOPICAL_OINTMENT | CUTANEOUS | Status: DC | PRN

## 2022-04-15 MED ORDER — TRAZODONE HCL 50 MG PO TABS
50.0000 mg | ORAL_TABLET | Freq: Every evening | ORAL | Status: DC | PRN
  Administered 2022-04-15 – 2022-04-16 (×2): 50 mg via ORAL
  Filled 2022-04-15 (×2): qty 1

## 2022-04-15 MED ORDER — POTASSIUM CHLORIDE CRYS ER 20 MEQ PO TBCR
40.0000 meq | EXTENDED_RELEASE_TABLET | Freq: Once | ORAL | Status: AC
  Administered 2022-04-15: 40 meq via ORAL
  Filled 2022-04-15: qty 2

## 2022-04-15 MED ORDER — ACETAMINOPHEN 325 MG PO TABS
650.0000 mg | ORAL_TABLET | Freq: Four times a day (QID) | ORAL | Status: DC | PRN
  Administered 2022-04-15 – 2022-04-17 (×3): 650 mg via ORAL
  Filled 2022-04-15 (×3): qty 2

## 2022-04-15 MED ORDER — MUSCLE RUB 10-15 % EX CREA: 1.0000 | TOPICAL_CREAM | CUTANEOUS | Status: DC | PRN

## 2022-04-15 MED ORDER — FAMOTIDINE IN NACL 20-0.9 MG/50ML-% IV SOLN
20.0000 mg | Freq: Two times a day (BID) | INTRAVENOUS | Status: DC
  Administered 2022-04-15 – 2022-04-16 (×2): 20 mg via INTRAVENOUS
  Filled 2022-04-15 (×2): qty 50

## 2022-04-15 MED ORDER — LORATADINE 10 MG PO TABS: 10.0000 mg | ORAL_TABLET | Freq: Every day | ORAL | Status: DC | PRN

## 2022-04-15 MED ORDER — EPINEPHRINE 1 MG/10ML IJ SOSY
PREFILLED_SYRINGE | INTRAMUSCULAR | Status: AC
  Filled 2022-04-15: qty 10

## 2022-04-15 MED ORDER — METOPROLOL TARTRATE 5 MG/5ML IV SOLN: 5.0000 mg | INTRAVENOUS | Status: DC | PRN

## 2022-04-15 MED ORDER — BUPRENORPHINE HCL-NALOXONE HCL 8-2 MG SL SUBL
1.0000 | SUBLINGUAL_TABLET | Freq: Three times a day (TID) | SUBLINGUAL | Status: DC
  Administered 2022-04-15 – 2022-04-17 (×7): 1 via SUBLINGUAL
  Filled 2022-04-15 (×7): qty 1

## 2022-04-15 MED ORDER — SALINE SPRAY 0.65 % NA SOLN: 1.0000 | NASAL | Status: DC | PRN

## 2022-04-15 MED ORDER — POTASSIUM CHLORIDE 10 MEQ/100ML IV SOLN
10.0000 meq | INTRAVENOUS | Status: DC
  Administered 2022-04-15 (×3): 10 meq via INTRAVENOUS
  Filled 2022-04-15: qty 100

## 2022-04-15 MED ORDER — POLYVINYL ALCOHOL 1.4 % OP SOLN: 1.0000 [drp] | OPHTHALMIC | Status: DC | PRN

## 2022-04-15 MED ORDER — EPINEPHRINE 0.3 MG/0.3ML IJ SOAJ
0.3000 mg | Freq: Once | INTRAMUSCULAR | Status: AC
  Administered 2022-04-15: 0.3 mg via INTRAMUSCULAR
  Filled 2022-04-15: qty 0.3

## 2022-04-15 MED ORDER — LIDOCAINE VISCOUS HCL 2 % MT SOLN
15.0000 mL | Freq: Once | OROMUCOSAL | Status: AC | PRN
  Administered 2022-04-16: 15 mL via OROMUCOSAL
  Filled 2022-04-15 (×2): qty 15

## 2022-04-15 MED ORDER — POTASSIUM PHOSPHATES 15 MMOLE/5ML IV SOLN
30.0000 mmol | Freq: Once | INTRAVENOUS | Status: AC
  Administered 2022-04-15: 30 mmol via INTRAVENOUS
  Filled 2022-04-15: qty 10

## 2022-04-15 NOTE — Progress Notes (Signed)
[facility] ADULT ICU REPLACEMENT PROTOCOL   The patient does apply for the [facility] Adult ICU Electrolyte Replacment Protocol based on the criteria listed below:   1.Exclusion criteria: TCTS, ECMO, Dialysis, and Myasthenia Gravis patients 2. Is GFR >/= 30 ml/min? Yes.    Patient's GFR today is >60 3. Is SCr </= 2? Yes.   Patient's SCr is 0.63 mg/dL 4. Did SCr increase >/= 0.5 in 24 hours? No. 5.Pt's weight >40kg  Yes.   6. Abnormal electrolyte(s): K+ 2.8  7. Electrolytes replaced per protocol 8.  Call MD STAT for K+ </= 2.5, Phos </= 1, or Mag </= 1 Physician:  Dr. Russella Dar, [place] Grumbling 04/15/2022 4:39 AM

## 2022-04-15 NOTE — Progress Notes (Signed)
I was informed by RN that patient has some stridor.  Patient was intubated on 3/17, extubated 3/18.  Vital signs are stable at this time.  When I arrived at bedside she was having some stridor but it did not appear to be fixed because as I left the room it subsided.  At this time not sure if this is allergic reaction or possibly related to intubation/extubation or psychogenic.  For now we will give her epinephrine, start Solu-Medrol, give her Benadryl, Pepcid.  Racemic epi nebulizer ordered.  I will make PCCM aware in case if need to proceed with further evaluation or if this continues.  Gerlean Ren MD [facility]

## 2022-04-15 NOTE — Progress Notes (Signed)
[facility] Progress Note Patient Name: LATIA REINKING DOB: [birth year] MRN: [id number]   Date of Service  04/15/2022  HPI/Events of Note  27 year old with a history of polysubstance abuse who initially presented with acute hypoxic respiratory failure with likely chemical aspiration.  No procedures planned.  Requesting advancing diet.  eICU Interventions  Patient able to pass bedside swallow, sore throat present-likely to resolve spontaneously, no intervention indicated at this time.  Advance diet as tolerated to regular.     Intervention Category Minor Interventions: Routine modifications to care plan (e.g. PRN medications for pain, fever)  Amariss Detamore 04/15/2022, 12:20 AM
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
## 2022-04-15 NOTE — Consult Note (Cosign Needed Addendum)
[place] Consult   Reason for Consult:  Overdose/accidental or unintentional  Referring Physician:  Ina Homes MD Patient Identification: [name] MRN:  [id number] Principal Diagnosis: Overdose opiate, accidental or unintentional, initial encounter [facility]) Diagnosis:  Principal Problem:   Overdose opiate, accidental or unintentional, initial encounter [facility])   Total Time spent with patient: 15 minutes  Subjective:   [name] is a 27 y.o. female seen and evaluated face-to-face by this provider.  Photographer at bedside.   Currently on interview, the patient. Alert and oriented x4. Patient becomes tearful at times during interview. Patient reports presenting to the emergency department after a suicide attempt via taking 6-8 Xanax pills. Over the past couple weeks she reports worsening anxiety, mood swings and depression. She reports symptoms of depression including hopelessness, guilt/worthlessness, concentration difficulty, disturbed sleep pattern (sleeping 3 to 4 hours during the day) loss of interest, low energy, decreased appetite, isolative, crying spells deterioration of ADLs and recurrent thoughts of death.  Of note, she does report "lying" to providers yesterday ; however after careful thinking she decided to be honest as she did not want to leave same way she came in.  Patient does present with some insight into her need for ongoing mental health services, mood instability, and ongoing risk for suicidality and expresses much interest in seeking inpatient psychiatric help.  She identifies her biggest trigger as her 8-year-old son has been taken into foster care, and she is frustrated by the legal system.  She currently is incarcerated for a recent charge of domestic violence.  She reports taking 6-8 "fake Xanax pills, I know they were fake because I had to receive Narcan.  I did not take fentanyl or heroin."  She reports  symptoms of anxiety including excessive worries, nervousness, irritability and panic attacks. She endorses having manic symptoms including irritability, extended insomnia, racing thoughts, and impulsivity. She notes that she has experienced mood swings for several years and reports that she has mood fluctuations, depression, and anxiety without use of substances.     Patient does not appear acutely manic on exam and she does not elicit or endorse any current psychotic symptoms. Patient currently denies SI/HI/AVH. Patient reports prior history of depression and anxiety.  She reports 1 prior suicide attempt by overdose.  She reports history of 3 previous inpatient psychiatric hospitalizations at [facility], old [place], and [facility].  She admits to daily use of Xanax 5 to 6 pills a day.  She is unable to identify the milligrams of the pills.  She reports she has been taking Xanax daily for about the past month.   She has a history of past psychiatric hospitalizations and past suicide attempts, see below. She reports drinking occasionally on the weekends and denies potential for withdrawal, will start CIWA monitoring for now.  Socially she lives with her mother, and works as a Passenger transport manager.  Highest level education completed ninth grade. Recommend inpatient psychiatric hospitalization for crisis stabilization to address symptoms of mood lability including depression/mania, anxiety, and suicidal ideations. Patient agrees to voluntary admission at this time.  However will remain under involuntary commitment.   [name] is sitting upright in bed; she is alert/oriented x to self and place. [name] presented flat and depressed and mood incongruent with affect. Patient is speaking in a clear tone at moderate volume, and normal pace; with fair eye contact. Her thought process is coherent and relevant; There is  no indication that she is currently responding to internal/external stimuli  or experiencing delusional thought content.  Patient denies suicidal/self-harm/homicidal ideation, psychosis, and paranoia.  Patient has remained calm throughout assessment and has answered questions appropriately. Psychiatry to continue to follow.     Per HPI; " 27 year old brought in by [employer] for  unresponsiveness.  She was arrested after a domestic dispute with mother  and taken to jail around 1 AM.  She possibly took cocaine, heroin or Suboxone or gabapentin, unknown time of use , was poorly responsive and brought to ED.  On arrival to ED, she was only responsive to sternal rub and had pinpoint pupils in the ED. given Narcan x2 , then intubated for airway protection . Labs significant for BUN/creatinine of 18/1.4, normal electrolytes, UDS positive for cocaine, benzodiazepines and opiate. "   Past Psychiatric History: See above.  History of suicide attempt and multiple inpatient hospitalizations.  Risk to Self:  Yes here for so suicide attempt Risk to Others:  Denies Prior Inpatient Therapy:  [name], old [place], [facility] [place] [facility] all for suicidality Prior Outpatient Therapy:  Denies currently  Past Medical History:  Past Medical History:  Diagnosis Date   ADHD (attention deficit hyperactivity disorder)    Anesthesia complication    woke up fighting after tonsillectomy   Anxiety    Cholecystitis    Complication of anesthesia    when wakes up " freaks out- like panic attack"   Depression    Dysmenorrhea    Endometriosis    GERD (gastroesophageal reflux disease)    Headache    migraines   Hyperlipidemia    Hypoglycemia    Polysubstance abuse ([place])    Pregnancy complicated by subutex maintenance, antepartum ([place])    Syncope    Viral warts    hand   Vision abnormalities    wears glasses    Past Surgical History:  Procedure Laterality Date   ADENOIDECTOMY     CHOLECYSTECTOMY  03/21/2011   Procedure: LAPAROSCOPIC CHOLECYSTECTOMY;  Surgeon: Harl Bowie,  MD;  Location: [place];  Service: General;  Laterality: N/A;   ESOPHAGOGASTRODUODENOSCOPY  09/26/2011   Procedure: ESOPHAGOGASTRODUODENOSCOPY (EGD);  Surgeon: Oletha Blend, MD;  Location: [place];  Service: Gastroenterology;  Laterality: N/A;   TONSILLECTOMY AND ADENOIDECTOMY  06/2005   WISDOM TOOTH EXTRACTION     Family History:  Family History  Problem Relation Age of Onset   Anesthesia problems Maternal Grandfather    Heart disease Maternal Grandfather    Nephrolithiasis Maternal Grandfather    Diabetes Maternal Grandfather    Mental illness Maternal Grandfather    Cholelithiasis Mother    Nephrolithiasis Mother    Depression Mother    Hypertension Mother    Miscarriages / Stillbirths Mother    Anxiety disorder Mother    Gout Father    Nephrolithiasis Maternal Grandmother    COPD Maternal Grandmother    Heart disease Paternal Grandfather    Cholelithiasis Maternal Aunt    Depression Maternal Aunt    Learning disabilities Maternal Aunt    Bipolar disorder Sister    Family Psychiatric  History:  Social History:  Social History   Substance and Sexual Activity  Alcohol Use Not Currently   Comment: socially     Social History   Substance and Sexual Activity  Drug Use Not Currently   Types: Marijuana, Heroin, Benzodiazepines   Comment: last used 11/2019     Social History   Socioeconomic History   Marital  status: Single    Spouse name: Not on file   Number of children: Not on file   Years of education: Not on file   Highest education level: Not on file  Occupational History   Occupation: Student    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  Tobacco Use   Smoking status: Every Day    Packs/day: 1.00    Years: 4.00    Additional pack years: 0.00    Total pack years: 4.00    Types: Cigarettes   Smokeless tobacco: Never   Tobacco comments:    0.5 PPD  Vaping Use   Vaping Use: Never used  Substance and Sexual Activity   Alcohol use: Not Currently    Comment: socially    Drug use: Not Currently    Types: Marijuana, Heroin, Benzodiazepines    Comment: last used 11/2019    Sexual activity: Yes    Birth control/protection: None  Other Topics Concern   Not on file  Social History Narrative   Repeating 9th grade (missed 40 days last year despite homebound instruction)   Social Determinants of Health   Financial Resource Strain: Not on file  Food Insecurity: No Food Insecurity (04/14/2022)   Hunger Vital Sign    Worried About Running Out of Food in the Last Year: Never true    Ran Out of Food in the Last Year: Never true  Transportation Needs: Unmet Transportation Needs (08/22/2020)   PRAPARE - Hydrologist (Medical): Yes    Lack of Transportation (Non-Medical): Yes  Physical Activity: Not on file  Stress: Not on file  Social Connections: Not on file   Additional Social History:    Allergies:   Allergies  Allergen Reactions   Blueberry Fruit Extract Anaphylaxis   Contrast Media [Iodinated Contrast Media] Anaphylaxis, Hives and Rash   Codeine Hives, Itching and Nausea And Vomiting   Latex Hives and Other (See Comments)    Welts, also   Omnipaque [Iohexol] Hives, Itching, Nausea And Vomiting and Swelling    Labs:  Results for orders placed or performed during the hospital encounter of 04/13/22 (from the past 48 hour(s))  Glucose, capillary     Status: None   Collection Time: 04/13/22  3:45 PM  Result Value Ref Range   Glucose-Capillary 93 70 - 99 mg/dL    Comment: Glucose reference range applies only to samples taken after fasting for at least 8 hours.  Magnesium     Status: None   Collection Time: 04/13/22  4:49 PM  Result Value Ref Range   Magnesium 2.1 1.7 - 2.4 mg/dL    Comment: Performed at [facility], [place] [address] [place], [place] [address]  Phosphorus     Status: None   Collection Time: 04/13/22  4:49 PM  Result Value Ref Range   Phosphorus 4.0 2.5 - 4.6 mg/dL    Comment:  Performed at [facility], [place] [address] [place], [place] [address]  Glucose, capillary     Status: None   Collection Time: 04/13/22  8:23 PM  Result Value Ref Range   Glucose-Capillary 73 70 - 99 mg/dL    Comment: Glucose reference range applies only to samples taken after fasting for at least 8 hours.   Comment 1 Notify RN   Glucose, capillary     Status: None   Collection Time: 04/13/22 11:49 PM  Result Value Ref Range   Glucose-Capillary 97 70 - 99 mg/dL  Comment: Glucose reference range applies only to samples taken after fasting for at least 8 hours.  Glucose, capillary     Status: None   Collection Time: 04/14/22  3:18 AM  Result Value Ref Range   Glucose-Capillary 82 70 - 99 mg/dL    Comment: Glucose reference range applies only to samples taken after fasting for at least 8 hours.  Glucose, capillary     Status: Abnormal   Collection Time: 04/14/22  8:19 AM  Result Value Ref Range   Glucose-Capillary 110 (H) 70 - 99 mg/dL    Comment: Glucose reference range applies only to samples taken after fasting for at least 8 hours.   Comment 1 Notify RN    Comment 2 Document in Chart   Triglycerides     Status: None   Collection Time: 04/14/22  8:22 AM  Result Value Ref Range   Triglycerides 63 <150 mg/dL    Comment: Performed at [facility], [place] [address] [place], [place] [address]  CBC     Status: Abnormal   Collection Time: 04/14/22  8:33 AM  Result Value Ref Range   WBC 5.7 4.0 - 10.5 K/uL   RBC 4.06 3.87 - 5.11 MIL/uL   Hemoglobin 10.6 (L) 12.0 - 15.0 g/dL   HCT 34.3 (L) 36.0 - 46.0 %   MCV 84.5 80.0 - 100.0 fL   MCH 26.1 26.0 - 34.0 pg   MCHC 30.9 30.0 - 36.0 g/dL   RDW 13.8 11.5 - 15.5 %   Platelets 121 (L) 150 - 400 K/uL   nRBC 0.0 0.0 - 0.2 %    Comment: Performed at [facility], [place] [address], [place], [place] [id number]  Basic metabolic panel     Status: Abnormal   Collection Time: 04/14/22  8:33 AM   Result Value Ref Range   Sodium 137 135 - 145 mmol/L   Potassium 3.8 3.5 - 5.1 mmol/L   Chloride 109 98 - 111 mmol/L   CO2 21 (L) 22 - 32 mmol/L   Glucose, Bld 98 70 - 99 mg/dL    Comment: Glucose reference range applies only to samples taken after fasting for at least 8 hours.   BUN 19 6 - 20 mg/dL   Creatinine, Ser 0.77 0.44 - 1.00 mg/dL   Calcium 7.5 (L) 8.9 - 10.3 mg/dL   GFR, Estimated >60 >60 mL/min    Comment: (NOTE) Calculated using the CKD-EPI Creatinine Equation (2021)    Anion gap 7 5 - 15    Comment: Performed at [facility], [place] [address] [place], [place] [address]  Magnesium     Status: None   Collection Time: 04/14/22  8:33 AM  Result Value Ref Range   Magnesium 2.1 1.7 - 2.4 mg/dL    Comment: Performed at [facility], [place] [address] [place], [place] [address]  Phosphorus     Status: None   Collection Time: 04/14/22  8:33 AM  Result Value Ref Range   Phosphorus 3.0 2.5 - 4.6 mg/dL    Comment: Performed at [facility], [place] [address], [place], [place] [address]  Culture, Respiratory w Gram Stain     Status: None (Preliminary result)   Collection Time: 04/14/22  8:49 AM   Specimen: Tracheal Aspirate; Respiratory  Result Value Ref Range   Specimen Description      TRACHEAL ASPIRATE Performed at [facility] [address], [place],  [address]  Special Requests      NONE Performed at [facility], [place] [address] [place], [place] [address]    Gram Stain      FEW WBC PRESENT,BOTH PMN AND MONONUCLEAR RARE GRAM POSITIVE COCCI IN PAIRS    Culture      MODERATE STREPTOCOCCUS PNEUMONIAE SUSCEPTIBILITIES TO FOLLOW Performed at [facility], [place] [address], [place], [place] [address]    Report Status PENDING   Magnesium     Status: None   Collection Time: 04/14/22  4:53 PM  Result Value Ref Range   Magnesium 1.9 1.7 - 2.4 mg/dL    Comment:  Performed at [facility], [place] [address] [place], [place] [address]  Phosphorus     Status: None   Collection Time: 04/14/22  4:53 PM  Result Value Ref Range   Phosphorus 2.5 2.5 - 4.6 mg/dL    Comment: Performed at [facility], [place] [address] [place], [place] [id number]  Basic metabolic panel     Status: Abnormal   Collection Time: 04/15/22  3:22 AM  Result Value Ref Range   Sodium 136 135 - 145 mmol/L   Potassium 2.8 (L) 3.5 - 5.1 mmol/L   Chloride 109 98 - 111 mmol/L   CO2 20 (L) 22 - 32 mmol/L   Glucose, Bld 180 (H) 70 - 99 mg/dL    Comment: Glucose reference range applies only to samples taken after fasting for at least 8 hours.   BUN 8 6 - 20 mg/dL   Creatinine, Ser 0.63 0.44 - 1.00 mg/dL   Calcium 7.4 (L) 8.9 - 10.3 mg/dL   GFR, Estimated >60 >60 mL/min    Comment: (NOTE) Calculated using the CKD-EPI Creatinine Equation (2021)    Anion gap 7 5 - 15    Comment: Performed at [facility], [place] [address] [place], [place] [address]  Magnesium     Status: None   Collection Time: 04/15/22  3:30 AM  Result Value Ref Range   Magnesium 1.9 1.7 - 2.4 mg/dL    Comment: Performed at [facility], [place] [address] [place], [place] [address]  Phosphorus     Status: Abnormal   Collection Time: 04/15/22  3:30 AM  Result Value Ref Range   Phosphorus 1.2 (L) 2.5 - 4.6 mg/dL    Comment: Performed at [facility], [place] [address], [place], [place] [address]  Procalcitonin     Status: None   Collection Time: 04/15/22  3:30 AM  Result Value Ref Range   Procalcitonin <0.10 ng/mL    Comment:        Interpretation: PCT (Procalcitonin) <= 0.5 ng/mL: Systemic infection (sepsis) is not likely. Local bacterial infection is possible. (NOTE)       Sepsis PCT Algorithm           Lower Respiratory Tract                                      Infection PCT Algorithm    ----------------------------      ----------------------------         PCT < 0.25 ng/mL                PCT < 0.10 ng/mL          Strongly encourage  Strongly discourage   discontinuation of antibiotics    initiation of antibiotics    ----------------------------     -----------------------------       PCT 0.25 - 0.50 ng/mL            PCT 0.10 - 0.25 ng/mL               OR       >80% decrease in PCT            Discourage initiation of                                            antibiotics      Encourage discontinuation           of antibiotics    ----------------------------     -----------------------------         PCT >= 0.50 ng/mL              PCT 0.26 - 0.50 ng/mL               AND        <80% decrease in PCT             Encourage initiation of                                             antibiotics       Encourage continuation           of antibiotics    ----------------------------     -----------------------------        PCT >= 0.50 ng/mL                  PCT > 0.50 ng/mL               AND         increase in PCT                  Strongly encourage                                      initiation of antibiotics    Strongly encourage escalation           of antibiotics                                     -----------------------------                                           PCT <= 0.25 ng/mL                                                 OR                                        >  80% decrease in PCT                                      Discontinue / Do not initiate                                             antibiotics  Performed at [facility] [address], [place], [place] [address]   CK     Status: Abnormal   Collection Time: 04/15/22  3:30 AM  Result Value Ref Range   Total CK 456 (H) 38 - 234 U/L    Comment: Performed at [facility], [place] [address], [place], [place] [address]    Current Facility-Administered Medications  Medication Dose  Route Frequency Provider Last Rate Last Admin   0.9 %  sodium chloride infusion   Intravenous Continuous Palumbo, April, MD 125 mL/hr at 04/15/22 1351 New Bag at 04/15/22 1351   acetaminophen (TYLENOL) tablet 650 mg  650 mg Oral Q6H PRN Amin, Ankit Chirag, MD       alum & mag hydroxide-simeth (MAALOX/MYLANTA) 200-200-20 MG/5ML suspension 30 mL  30 mL Oral Q4H PRN Amin, Ankit Chirag, MD       buprenorphine-naloxone (SUBOXONE) 8-2 mg per SL tablet 1 tablet  1 tablet Sublingual TID Damita Lack, MD   1 tablet at 04/15/22 0925   Chlorhexidine Gluconate Cloth 2 % PADS 6 each  6 each Topical Daily Olalere, Adewale A, MD   6 each at 04/13/22 0842   enoxaparin (LOVENOX) injection 40 mg  40 mg Subcutaneous Q24H Tawnya Crook, RPH   40 mg at 04/15/22 4315   gabapentin (NEURONTIN) capsule 600 mg  600 mg Oral BID Amin, Ankit Chirag, MD   600 mg at 04/15/22 0925   guaiFENesin (ROBITUSSIN) 100 MG/5ML liquid 5 mL  5 mL Oral Q4H PRN Amin, Ankit Chirag, MD   5 mL at 04/15/22 1214   hydrocortisone (ANUSOL-HC) 2.5 % rectal cream 1 Application  1 Application Topical QID PRN Amin, Ankit Chirag, MD       hydrocortisone cream 1 % 1 Application  1 Application Topical TID PRN Amin, Ankit Chirag, MD       ipratropium-albuterol (DUONEB) 0.5-2.5 (3) MG/3ML nebulizer solution 3 mL  3 mL Nebulization Q4H PRN Amin, Ankit Chirag, MD   3 mL at 04/15/22 1332   lip balm (CARMEX) ointment 1 Application  1 Application Topical PRN Amin, Ankit Chirag, MD       loratadine (CLARITIN) tablet 10 mg  10 mg Oral Daily PRN Amin, Ankit Chirag, MD       metoprolol tartrate (LOPRESSOR) injection 5 mg  5 mg Intravenous Q4H PRN Amin, Ankit Chirag, MD       Muscle Rub CREA 1 Application  1 Application Topical PRN Amin, Ankit Chirag, MD       ondansetron (ZOFRAN) injection 4 mg  4 mg Intravenous Q6H PRN Rigoberto Noel, MD       Oral care mouth rinse  15 mL Mouth Rinse 4 times per day Candee Furbish, MD       phenol (CHLORASEPTIC) mouth  spray 1 spray  1 spray Mouth/Throat PRN Damita Lack, MD   1 spray at 04/15/22 1214   polyvinyl alcohol (LIQUIFILM  TEARS) 1.4 % ophthalmic solution 1 drop  1 drop Both Eyes PRN Amin, Ankit Chirag, MD       potassium PHOSPHATE 30 mmol in dextrose 5 % 500 mL infusion  30 mmol Intravenous Once Amin, Ankit Chirag, MD 85 mL/hr at 04/15/22 1351 30 mmol at 04/15/22 1351   sodium chloride (OCEAN) 0.65 % nasal spray 1 spray  1 spray Each Nare PRN Amin, Ankit Chirag, MD       traZODone (DESYREL) tablet 50 mg  50 mg Oral QHS PRN Amin, Jeanella Flattery, MD        Musculoskeletal:   Psychiatric Specialty Exam:  Presentation  General Appearance:  Appropriate for Environment  Eye Contact: Good  Speech: Clear and Coherent; Slow  Speech Volume: Normal  Handedness: Right   Mood and Affect  Mood: Dysphoric; Depressed  Affect: Blunt; Depressed   Thought Process  Thought Processes: Coherent; Linear  Descriptions of Associations:Intact  Orientation:Full (Time, Place and Person)  Thought Content:Logical  History of Schizophrenia/Schizoaffective disorder:No data recorded Duration of Psychotic Symptoms:No data recorded Hallucinations:Hallucinations: None  Ideas of Reference:None  Suicidal Thoughts:Suicidal Thoughts: No  Homicidal Thoughts:Homicidal Thoughts: No   Sensorium  Memory: Immediate Fair; Recent Fair; Remote Fair  Judgment: Fair  Insight: Fair   Community education officer  Concentration: Fair  Attention Span: Fair  Recall: [employer] of Knowledge: Fair  Language: Fair   Psychomotor Activity  Psychomotor Activity: Psychomotor Activity: Normal   Assets  Assets: Communication Skills; Desire for Improvement; Social Support; Financial Resources/Insurance   Sleep  Sleep: Sleep: Fair   Physical Exam: Physical Exam Vitals and nursing note reviewed.  Constitutional:      Appearance: Normal appearance.  Neurological:     Mental Status: She  is alert.  Psychiatric:        Attention and Perception: Attention and perception normal.        Mood and Affect: Mood normal.        Speech: Speech normal.        Behavior: Behavior normal.        Thought Content: Thought content includes suicidal ideation. Thought content includes suicidal plan.        Cognition and Memory: Cognition and memory normal.        Judgment: Judgment is impulsive.    Review of Systems  Psychiatric/Behavioral:  Positive for depression, substance abuse and suicidal ideas.   All other systems reviewed and are negative.  Blood pressure (!) 112/55, pulse 90, temperature 98 F (36.7 C), temperature source Oral, resp. rate (!) 0, height 5\' 7"  (1.702 m), weight 90.4 kg, SpO2 100 %, not currently breastfeeding. Body mass index is 31.21 kg/m.   Treatment Plan: Recommend inpatient psychiatry once medically stable. -Recommend continuing CIWA protocol.  Patient with recent history of benzodiazepine use disorder. -Patient not under IVC, will initiate at this time. TOC contacted.  -Once medically stable recommend TOC consult for referral for inpatient psychiatric hospitalization for medical management, crisis stabilization, and coping skills/behavior therapy.  Labs reviewed multiple abnormalities noted to include hypokalemia (2.8), elevated glucose (180), hypophosphatemia (1.2), hypocalcemia (7.4), elevated CK (456).  Urine drug screen positive for benzodiazepine, opiates, cocaine.  EKG obtained on 319, QTc of 441.  Will obtain TSH, B12.  Writer's spoke with officer Gerald Dexter of [employer] (court liaison) at [phone].  Discussed with officer current plan for inpatient psychiatric hospitalization as patient did endorse suicide attempt by overdose on 6-8 Xanax pills, however current barrier of police custody.  Garment/textile technologist  did explain he would be able to present in person to complete a virtual court hearing with [facility], and which time we will  determine if patient can sign a written promise to appear.  He reports he will be present around 345 this afternoon to complete this process and fingerprint.  At which time patient will be released from police custody, and can pursue inpatient psychiatric hospitalization.   Disposition: Recommend psychiatric Inpatient admission when medically cleared.   Psychiatry to continue to follow   Suella Broad, FNP 04/15/2022 3:43 PM

## 2022-04-15 NOTE — Progress Notes (Signed)
PROGRESS NOTE    [name]  [id number] DOB: Feb 17, 1995 DOA: 04/13/2022 PCP: Pcp, No   Brief Narrative:  27 year old brought in by [facility] for being unresponsive.  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  In the ED patient was unresponsive with pinpoint pupils.  Narcan was given and intubated for airway protection.   Assessment & Plan:  Principal Problem:   Overdose opiate, accidental or unintentional, initial encounter ([place])  Acute hypoxic respiratory failure Acute toxic metabolic encephalopathy Polysubstance abuse -UDS positive for polysubstance including cocaine, benzodiazepine and opioids.  Initially intubated for airway protection and extubated on 3/18.  Supportive care, bronchodilators as needed.  Past speech and swallow.  We will resume home Suboxone and gabapentin - CT head negative - Psychiatry consulted  Acute kidney injury -Admission creatinine 1.43, resolved with IV fluid.  Severe hypokalemia/hypophosphatemia - Aggressive repletion  Mild rhabdomyolysis - IV fluids   DVT prophylaxis: Lovenox Code Status: Full code Family Communication:    Status is: Inpatient Continue hospital stay for aggressive electrolyte replacement, IV fluids for rhabdomyolysis.  Hopefully we can discharge her in next 24-48 hours Nutritional status    Signs/Symptoms: NPO status  Interventions: Tube feeding, Prostat  Body mass index is 31.21 kg/m.         Subjective: Patient is sitting up eating her breakfast.  No complaints today but feels like her Suboxone withdrawal was coming on.   Examination:  General exam: Appears calm and comfortable  Respiratory system: Clear to auscultation. Respiratory effort normal. Cardiovascular system: S1 & S2 heard, RRR.  No JVD, murmurs, rubs, gallops or clicks. No pedal edema. Gastrointestinal system: Abdomen is nondistended, soft and nontender. No organomegaly or masses felt. Normal bowel sounds heard. Central nervous system: Alert and oriented. No focal neurological deficits. Extremities: Symmetric 5 x 5 power. Skin: No rashes, lesions or ulcers Psychiatry: Judgement and insight appear normal. Mood & affect appropriate.     Objective: Vitals:   04/15/22 0300 04/15/22 0400 04/15/22 0500 04/15/22 0600  BP:  (!) 110/56 111/62 (!) 112/55  Pulse:  (!) 101 97 90  Resp:  (!) 23 13 (!) 0  Temp: 99.5 F (37.5 C)     TempSrc: Oral     SpO2:  96% 95% 97%  Weight:   90.4 kg   Height:        Intake/Output Summary (Last 24 hours) at 04/15/2022 0724 Last data filed at 04/15/2022 [id number] Gross per 24 hour  Intake 5920.72 ml  Output 200 ml  Net 5720.72 ml   Filed Weights   04/13/22 0834 04/14/22 0710 04/15/22 0500  Weight: 87.5 kg 91 kg 90.4 kg     Data Reviewed:   CBC: Recent Labs  Lab 04/13/22 0325 04/14/22 0833  WBC 5.0 5.7  NEUTROABS 1.9  --   HGB 11.6* 10.6*  HCT 36.5 34.3*  MCV 81.8 84.5  PLT 191 [id number]*   Basic Metabolic Panel: Recent Labs  Lab 04/13/22 0325 04/13/22 0914 04/13/22 1649 04/14/22 0833 04/14/22 1653 04/15/22 0322 04/15/22 0330  NA 137  --   --  137  --  136  --   K 3.8  --   --  3.8  --  2.8*  --   CL 104  --   --  109  --  109  --   CO2 23  --   --  21*  --  20*  --   GLUCOSE 83  --   --  98  --  180*  --   BUN 18  --   --  19  --  8  --   CREATININE 1.43*  --   --  0.77  --  0.63  --   CALCIUM 9.3  --   --  7.5*  --  7.4*  --   MG  --  2.1 2.1 2.1 1.9  --  1.9  PHOS  --  4.8* 4.0 3.0 2.5  --   --    GFR: Estimated Creatinine Clearance: 124.1 mL/min (by C-G formula based on SCr of 0.63 mg/dL). Liver Function Tests: Recent Labs  Lab 04/13/22 0325  AST 28  ALT 20  ALKPHOS 51  BILITOT 0.4  PROT 6.6  ALBUMIN 4.1   No results for input(s): "LIPASE",  "AMYLASE" in the last 168 hours. No results for input(s): "AMMONIA" in the last 168 hours. Coagulation Profile: No results for input(s): "INR", "PROTIME" in the last 168 hours. Cardiac Enzymes: Recent Labs  Lab 04/13/22 0914  CKTOTAL 326*   BNP (last 3 results) No results for input(s): "PROBNP" in the last 8760 hours. HbA1C: No results for input(s): "HGBA1C" in the last 72 hours. CBG: Recent Labs  Lab 04/13/22 1545 04/13/22 2023 04/13/22 2349 04/14/22 0318 04/14/22 0819  GLUCAP 93 73 97 82 110*   Lipid Profile: Recent Labs    04/14/22 0822  TRIG 63   Thyroid Function Tests: No results for input(s): "TSH", "T4TOTAL", "FREET4", "T3FREE", "THYROIDAB" in the last 72 hours. Anemia Panel: No results for input(s): "VITAMINB12", "FOLATE", "FERRITIN", "TIBC", "IRON", "RETICCTPCT" in the last 72 hours. Sepsis Labs: No results for input(s): "PROCALCITON", "LATICACIDVEN" in the last 168 hours.  Recent Results (from the past 240 hour(s))  Resp panel by RT-PCR (RSV, Flu A&B, Covid) Anterior Nasal Swab     Status: None   Collection Time: 04/13/22  3:33 AM   Specimen: Anterior Nasal Swab  Result Value Ref Range Status   SARS Coronavirus 2 by RT PCR NEGATIVE NEGATIVE Final    Comment: (NOTE) SARS-CoV-2 target nucleic acids are NOT DETECTED.  The SARS-CoV-2 RNA is generally detectable in upper respiratory specimens during the acute phase of infection. The lowest concentration of SARS-CoV-2 viral copies this assay can detect is 138 copies/mL. A negative result does not preclude SARS-Cov-2 infection and should not be used as the sole basis for treatment or other patient management decisions. A negative result may occur with  improper specimen collection/handling, submission of specimen other than nasopharyngeal swab, presence of viral mutation(s) within the areas targeted by this assay, and inadequate number of viral copies(<138 copies/mL). A negative result must be combined  with clinical observations, patient history, and epidemiological information. The expected result is Negative.  Fact Sheet for Patients:  EntrepreneurPulse.com.au  Fact Sheet for Healthcare Providers:  IncredibleEmployment.be  This test is no t yet approved or cleared by the [place] FDA and  has been authorized for detection and/or diagnosis of SARS-CoV-2 by FDA under an Emergency Use Authorization (EUA). This EUA will remain  in effect (meaning this test can be used) for the duration of the COVID-19 declaration under Section 564(b)(1) of the Act, 21 U.S.C.section 360bbb-3(b)(1), unless the authorization is terminated  or revoked sooner.       Influenza A by  PCR NEGATIVE NEGATIVE Final   Influenza B by PCR NEGATIVE NEGATIVE Final    Comment: (NOTE) The Xpert Xpress SARS-CoV-2/FLU/RSV plus assay is intended as an aid in the diagnosis of influenza from Nasopharyngeal swab specimens and should not be used as a sole basis for treatment. Nasal washings and aspirates are unacceptable for Xpert Xpress SARS-CoV-2/FLU/RSV testing.  Fact Sheet for Patients: EntrepreneurPulse.com.au  Fact Sheet for Healthcare Providers: IncredibleEmployment.be  This test is not yet approved or cleared by the [place] FDA and has been authorized for detection and/or diagnosis of SARS-CoV-2 by FDA under an Emergency Use Authorization (EUA). This EUA will remain in effect (meaning this test can be used) for the duration of the COVID-19 declaration under Section 564(b)(1) of the Act, 21 U.S.C. section 360bbb-3(b)(1), unless the authorization is terminated or revoked.     Resp Syncytial Virus by PCR NEGATIVE NEGATIVE Final    Comment: (NOTE) Fact Sheet for Patients: EntrepreneurPulse.com.au  Fact Sheet for Healthcare Providers: IncredibleEmployment.be  This test is not yet approved  or cleared by the [place] FDA and has been authorized for detection and/or diagnosis of SARS-CoV-2 by FDA under an Emergency Use Authorization (EUA). This EUA will remain in effect (meaning this test can be used) for the duration of the COVID-19 declaration under Section 564(b)(1) of the Act, 21 U.S.C. section 360bbb-3(b)(1), unless the authorization is terminated or revoked.  Performed at [facility], [place] [address], [place], [place] [address]   MRSA Next Gen by PCR, Nasal     Status: None   Collection Time: 04/13/22  8:41 AM   Specimen: Nasal Mucosa; Nasal Swab  Result Value Ref Range Status   MRSA by PCR Next Gen NOT DETECTED NOT DETECTED Final    Comment: (NOTE) The GeneXpert MRSA Assay (FDA approved for NASAL specimens only), is one component of a comprehensive MRSA colonization surveillance program. It is not intended to diagnose MRSA infection nor to guide or monitor treatment for MRSA infections. Test performance is not FDA approved in patients less than 92 years old. Performed at [facility], [place] [address], [place], [place] [address]   Culture, Respiratory w Gram Stain     Status: None (Preliminary result)   Collection Time: 04/14/22  8:49 AM   Specimen: Tracheal Aspirate; Respiratory  Result Value Ref Range Status   Specimen Description   Final    TRACHEAL ASPIRATE Performed at [facility] [address], [place], [place] [address]    Special Requests   Final    NONE Performed at [facility], [place] [address] [place], [place] [address]    Gram Stain   Final    FEW WBC PRESENT,BOTH PMN AND MONONUCLEAR RARE GRAM POSITIVE COCCI IN PAIRS Performed at [facility], [place] [address], [place], [place] [address]    Culture PENDING  Incomplete   Report Status PENDING  Incomplete         Radiology Studies: DG Chest Port 1 View  Result Date: 04/14/2022 CLINICAL DATA:   Pneumonia. EXAM: PORTABLE CHEST 1 VIEW COMPARISON:  04/13/2022. FINDINGS: 0825 hours. Apparent interval retraction of the endotracheal tube, which now projects over the midthoracic trachea. Enteric tube tip and side port course below the diaphragm beyond the field of view. Increased opacity in the left lung base with associated volume loss, consistent with atelectasis. Right lung is clear. No pneumothorax or definite pleural effusion. IMPRESSION: 1. Interval retraction of the endotracheal tube now  projecting over the midthoracic trachea. 2. Increased opacity in the left lung base with associated volume loss, consistent with atelectasis. Electronically Signed   By: Emmit Alexanders M.D.   On: 04/14/2022 08:57   DG Abd Portable 1V  Result Date: 04/13/2022 CLINICAL DATA:  Nasogastric tube placement. EXAM: PORTABLE ABDOMEN - 1 VIEW COMPARISON:  08/29/2014 and 12/24/2020 FINDINGS: Endotracheal tube has tip 4.7 cm above the carina. Nasogastric tube courses into the stomach as side-port is noted over the stomach in the left upper quadrant as tip is not visualized, but likely within the mid to distal stomach. Visualized bowel gas pattern is nonobstructive. No free peritoneal air. Remainder of the exam is unchanged. IMPRESSION: 1. Nonobstructive bowel gas pattern. 2. Tubes and lines as described. Electronically Signed   By: Marin Olp M.D.   On: 04/13/2022 09:52        Scheduled Meds:  Chlorhexidine Gluconate Cloth  6 each Topical Daily   enoxaparin (LOVENOX) injection  40 mg Subcutaneous Q24H   mouth rinse  15 mL Mouth Rinse 4 times per day   Continuous Infusions:  sodium chloride 125 mL/hr at 04/15/22 0607   potassium chloride 10 mEq (04/15/22 0610)     LOS: 2 days   Time spent= 35 mins    Kj Imbert Arsenio Loader, MD [facility]  If 7PM-7AM, please contact night-coverage  04/15/2022, 7:24 AM

## 2022-04-16 ENCOUNTER — Other Ambulatory Visit: Payer: Self-pay

## 2022-04-16 DIAGNOSIS — T40601A Poisoning by unspecified narcotics, accidental (unintentional), initial encounter: Secondary | ICD-10-CM | POA: Diagnosis not present

## 2022-04-16 LAB — BASIC METABOLIC PANEL
Anion gap: 4 — ABNORMAL LOW (ref 5–15)
BUN: 6 mg/dL (ref 6–20)
CO2: 20 mmol/L — ABNORMAL LOW (ref 22–32)
Calcium: 8.2 mg/dL — ABNORMAL LOW (ref 8.9–10.3)
Chloride: 112 mmol/L — ABNORMAL HIGH (ref 98–111)
Creatinine, Ser: 0.57 mg/dL (ref 0.44–1.00)
GFR, Estimated: >60 mL/min (ref >60)
Glucose, Bld: 128 mg/dL — ABNORMAL HIGH (ref 70–99)
Potassium: 3.9 mmol/L (ref 3.5–5.1)
Sodium: 136 mmol/L (ref 135–145)

## 2022-04-16 LAB — TSH: TSH: 0.343 u[IU]/mL — ABNORMAL LOW (ref 0.350–4.500)

## 2022-04-16 LAB — CK: Total CK: 400 U/L — ABNORMAL HIGH (ref 38–234)

## 2022-04-16 LAB — VITAMIN B12: Vitamin B-12: 274 pg/mL (ref 180–914)

## 2022-04-16 LAB — CBC
HCT: 34.8 % — ABNORMAL LOW (ref 36.0–46.0)
Hemoglobin: 10.6 g/dL — ABNORMAL LOW (ref 12.0–15.0)
MCH: 25.8 pg — ABNORMAL LOW (ref 26.0–34.0)
MCHC: 30.5 g/dL (ref 30.0–36.0)
MCV: 84.7 fL (ref 80.0–100.0)
Platelets: 118 10*3/uL — ABNORMAL LOW (ref 150–400)
RBC: 4.11 MIL/uL (ref 3.87–5.11)
RDW: 14 % (ref 11.5–15.5)
WBC: 2.6 10*3/uL — ABNORMAL LOW (ref 4.0–10.5)
nRBC: 0 % (ref 0.0–0.2)

## 2022-04-16 LAB — MAGNESIUM: Magnesium: 2 mg/dL (ref 1.7–2.4)

## 2022-04-16 LAB — PHOSPHORUS: Phosphorus: 2.6 mg/dL (ref 2.5–4.6)

## 2022-04-16 LAB — T4, FREE: Free T4: 0.6 ng/dL — ABNORMAL LOW (ref 0.61–1.12)

## 2022-04-16 MED ORDER — NICOTINE 14 MG/24HR TD PT24
14.0000 mg | MEDICATED_PATCH | Freq: Every day | TRANSDERMAL | Status: DC
  Administered 2022-04-16 – 2022-04-17 (×2): 14 mg via TRANSDERMAL
  Filled 2022-04-16 (×2): qty 1

## 2022-04-16 MED ORDER — ALPRAZOLAM 0.5 MG PO TABS
0.5000 mg | ORAL_TABLET | Freq: Three times a day (TID) | ORAL | Status: DC | PRN
  Administered 2022-04-16 – 2022-04-17 (×3): 0.5 mg via ORAL
  Filled 2022-04-16 (×3): qty 1

## 2022-04-16 MED ORDER — FAMOTIDINE 20 MG PO TABS
20.0000 mg | ORAL_TABLET | Freq: Two times a day (BID) | ORAL | Status: DC
  Administered 2022-04-16 – 2022-04-17 (×2): 20 mg via ORAL
  Filled 2022-04-16 (×2): qty 1

## 2022-04-16 NOTE — Consult Note (Signed)
[place] Psychiatry Consult   Reason for Consult:  Overdose/accidental or unintentional  Referring Physician:  Ina Homes MD Patient Identification: [name] MRN:  [id number] Principal Diagnosis: Overdose opiate, accidental or unintentional, initial encounter [facility]) Diagnosis:  Principal Problem:   Overdose opiate, accidental or unintentional, initial encounter [facility])   Total Time spent with patient: 15 minutes  Subjective:   [name] is a 27 y.o. female seen and evaluated face-to-face by this provider.  Air cabin crew. Psychiatry was consulted on 03/18 for suicide attempt by overdose. She continues to have vague thoughts of self harm and remains despondent. She is very guarded and focused on inpatient psychiatry so that she can get this behind her. She disconnected, and lacks some ability to engage well with this provider when compared to yesterday. Her mood is depressed and dysphoric, and her affect is congruent. When assessing for medications she reports previously no medications have worked well for her.  Patient was asked to identify one medication that helped with her mood, in which she denied such.  Conversation was then initiated to have discussion about psychotropic medications in which she declined.  She is interested in receiving crisis stabilization in behavior modification/therapy in an inpatient setting.  She is advised in the event she changes her mind to please notify this provider, so that we can initiate medication.  We discussed next steps and course of action to include once medically stable will be referred to inpatient psychiatry for further management of co-occurring disorder and suicide attempt.  She has not received any as needed medications at this time.  It does appear she was resumed on her Suboxone 8-2 mg 1 tablet p.o. 3 times daily by primary team.  She was restarted on her medications and reports an improvement in how she is doing today, although current  presentation inconsistent with symptoms reported.  She endorsees fleeting SI without a plan or intention to harm self. She denies HI or AVH.  At this time she continues to want inpatient psychiatric hospitalization  [name] is sitting upright in bed; she is alert/oriented x to self and place. [name] presented flat and depressed and mood incongruent with affect. Patient is speaking in a clear tone at moderate volume, and normal pace; with fair eye contact. Her thought process is coherent and relevant; There is no indication that she is currently responding to internal/external stimuli or experiencing delusional thought content.  Patient denies suicidal/self-harm/homicidal ideation, psychosis, and paranoia.  Patient has remained calm throughout assessment and has answered questions appropriately. Psychiatry to continue to follow.   Per HPI; " 27 year old brought in by [employer] for  unresponsiveness.  She was arrested after a domestic dispute with mother  and taken to jail around 1 AM.  She possibly took cocaine, heroin or Suboxone or gabapentin, unknown time of use , was poorly responsive and brought to ED.  On arrival to ED, she was only responsive to sternal rub and had pinpoint pupils in the ED. given Narcan x2 , then intubated for airway protection . Labs significant for BUN/creatinine of 18/1.4, normal electrolytes, UDS positive for cocaine, benzodiazepines and opiate. "   Past Psychiatric History: See above.  History of suicide attempt and multiple inpatient hospitalizations.  Risk to Self:  Yes here for so suicide attempt Risk to Others:  Denies Prior Inpatient Therapy:  [name], [facility] [place], [facility] all for suicidality Prior Outpatient Therapy:  Denies currently  Past Medical History:  Past Medical  History:  Diagnosis Date   ADHD (attention deficit hyperactivity disorder)    Anesthesia complication    woke up fighting after tonsillectomy   Anxiety     Cholecystitis    Complication of anesthesia    when wakes up " freaks out- like panic attack"   Depression    Dysmenorrhea    Endometriosis    GERD (gastroesophageal reflux disease)    Headache    migraines   Hyperlipidemia    Hypoglycemia    Polysubstance abuse ([place])    Pregnancy complicated by subutex maintenance, antepartum ([place])    Syncope    Viral warts    hand   Vision abnormalities    wears glasses    Past Surgical History:  Procedure Laterality Date   ADENOIDECTOMY     CHOLECYSTECTOMY  03/21/2011   Procedure: LAPAROSCOPIC CHOLECYSTECTOMY;  Surgeon: Harl Bowie, MD;  Location: [facility];  Service: General;  Laterality: N/A;   ESOPHAGOGASTRODUODENOSCOPY  09/26/2011   Procedure: ESOPHAGOGASTRODUODENOSCOPY (EGD);  Surgeon: Oletha Blend, MD;  Location: [facility];  Service: Gastroenterology;  Laterality: N/A;   TONSILLECTOMY AND ADENOIDECTOMY  06/2005   WISDOM TOOTH EXTRACTION     Family History:  Family History  Problem Relation Age of Onset   Anesthesia problems Maternal Grandfather    Heart disease Maternal Grandfather    Nephrolithiasis Maternal Grandfather    Diabetes Maternal Grandfather    Mental illness Maternal Grandfather    Cholelithiasis Mother    Nephrolithiasis Mother    Depression Mother    Hypertension Mother    Miscarriages / Stillbirths Mother    Anxiety disorder Mother    Gout Father    Nephrolithiasis Maternal Grandmother    COPD Maternal Grandmother    Heart disease Paternal Grandfather    Cholelithiasis Maternal Aunt    Depression Maternal Aunt    Learning disabilities Maternal Aunt    Bipolar disorder Sister    Family Psychiatric  History:  Social History:  Social History   Substance and Sexual Activity  Alcohol Use Not Currently   Comment: socially     Social History   Substance and Sexual Activity  Drug Use Not Currently   Types: Marijuana, Heroin, Benzodiazepines   Comment: last used 11/2019     Social History    Socioeconomic History   Marital status: Single    Spouse name: Not on file   Number of children: Not on file   Years of education: Not on file   Highest education level: Not on file  Occupational History   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  Tobacco Use   Smoking status: Every Day    Packs/day: 1.00    Years: 4.00    Additional pack years: 0.00    Total pack years: 4.00    Types: Cigarettes   Smokeless tobacco: Never   Tobacco comments:    0.5 PPD  Vaping Use   Vaping Use: Never used  Substance and Sexual Activity   Alcohol use: Not Currently    Comment: socially   Drug use: Not Currently    Types: Marijuana, Heroin, Benzodiazepines    Comment: last used 11/2019    Sexual activity: Yes    Birth control/protection: None  Other Topics Concern   Not on file  Social History Narrative   Repeating 9th grade (missed 40 days last year despite homebound instruction)   Social Determinants of Health   Financial Resource Strain: Not on file  Food  Insecurity: No Food Insecurity (04/14/2022)   Hunger Vital Sign    Worried About Running Out of Food in the Last Year: Never true    Ran Out of Food in the Last Year: Never true  Transportation Needs: Unmet Transportation Needs (04/16/2022)   PRAPARE - Hydrologist (Medical): Yes    Lack of Transportation (Non-Medical): Yes  Physical Activity: Not on file  Stress: Not on file  Social Connections: Not on file   Additional Social History:    Allergies:   Allergies  Allergen Reactions   Blueberry Fruit Extract Anaphylaxis   Contrast Media [Iodinated Contrast Media] Anaphylaxis, Hives and Rash   Codeine Hives, Itching and Nausea And Vomiting   Latex Hives and Other (See Comments)    Welts, also   Omnipaque [Iohexol] Hives, Itching, Nausea And Vomiting and Swelling    Labs:  Results for orders placed or performed during the hospital encounter of 04/13/22 (from the past 48  hour(s))  Magnesium     Status: None   Collection Time: 04/14/22  4:53 PM  Result Value Ref Range   Magnesium 1.9 1.7 - 2.4 mg/dL    Comment: Performed at [facility], [place] [address], [place], [place] [address]  Phosphorus     Status: None   Collection Time: 04/14/22  4:53 PM  Result Value Ref Range   Phosphorus 2.5 2.5 - 4.6 mg/dL    Comment: Performed at [facility], [place] [address], [place], [place] [id number]  Basic metabolic panel     Status: Abnormal   Collection Time: 04/15/22  3:22 AM  Result Value Ref Range   Sodium 136 135 - 145 mmol/L   Potassium 2.8 (L) 3.5 - 5.1 mmol/L   Chloride 109 98 - 111 mmol/L   CO2 20 (L) 22 - 32 mmol/L   Glucose, Bld 180 (H) 70 - 99 mg/dL    Comment: Glucose reference range applies only to samples taken after fasting for at least 8 hours.   BUN 8 6 - 20 mg/dL   Creatinine, Ser 0.63 0.44 - 1.00 mg/dL   Calcium 7.4 (L) 8.9 - 10.3 mg/dL   GFR, Estimated >60 >60 mL/min    Comment: (NOTE) Calculated using the CKD-EPI Creatinine Equation (2021)    Anion gap 7 5 - 15    Comment: Performed at [facility], [place] [address], [place], [place] [address]  Magnesium     Status: None   Collection Time: 04/15/22  3:30 AM  Result Value Ref Range   Magnesium 1.9 1.7 - 2.4 mg/dL    Comment: Performed at [facility], [place] [address] [place], [place] [address]  Phosphorus     Status: Abnormal   Collection Time: 04/15/22  3:30 AM  Result Value Ref Range   Phosphorus 1.2 (L) 2.5 - 4.6 mg/dL    Comment: Performed at [facility], [place] [address], [place],  [address]  Procalcitonin     Status: None   Collection Time: 04/15/22  3:30 AM  Result Value Ref Range   Procalcitonin <0.10 ng/mL    Comment:        Interpretation: PCT (Procalcitonin) <= 0.5 ng/mL: Systemic infection (sepsis) is not likely. Local bacterial infection is possible. (NOTE)       Sepsis  PCT Algorithm           Lower Respiratory Tract  Infection PCT Algorithm    ----------------------------     ----------------------------         PCT < 0.25 ng/mL                PCT < 0.10 ng/mL          Strongly encourage             Strongly discourage   discontinuation of antibiotics    initiation of antibiotics    ----------------------------     -----------------------------       PCT 0.25 - 0.50 ng/mL            PCT 0.10 - 0.25 ng/mL               OR       >80% decrease in PCT            Discourage initiation of                                            antibiotics      Encourage discontinuation           of antibiotics    ----------------------------     -----------------------------         PCT >= 0.50 ng/mL              PCT 0.26 - 0.50 ng/mL               AND        <80% decrease in PCT             Encourage initiation of                                             antibiotics       Encourage continuation           of antibiotics    ----------------------------     -----------------------------        PCT >= 0.50 ng/mL                  PCT > 0.50 ng/mL               AND         increase in PCT                  Strongly encourage                                      initiation of antibiotics    Strongly encourage escalation           of antibiotics                                     -----------------------------                                           PCT <= 0.25 ng/mL  OR                                        > 80% decrease in PCT                                      Discontinue / Do not initiate                                             antibiotics  Performed at [place] [address] [place], [place] [address]   CK     Status: Abnormal   Collection Time: 04/15/22  3:30 AM  Result Value Ref Range   Total CK 456 (H) 38 - 234 U/L    Comment: Performed  at [facility], [place] [address] [place], [place] [address]  T4, free     Status: Abnormal   Collection Time: 04/16/22  3:01 AM  Result Value Ref Range   Free T4 0.60 (L) 0.61 - 1.12 ng/dL    Comment: (NOTE) Biotin ingestion may interfere with free T4 tests. If the results are inconsistent with the TSH level, previous test results, or the clinical presentation, then consider biotin interference. If needed, order repeat testing after stopping biotin. Performed at [facility], [place] [address] [place], [place] [address]   Phosphorus     Status: None   Collection Time: 04/16/22  3:01 AM  Result Value Ref Range   Phosphorus 2.6 2.5 - 4.6 mg/dL    Comment: Performed at [facility], [place] [address] [place], [place] [address]  CK     Status: Abnormal   Collection Time: 04/16/22  3:01 AM  Result Value Ref Range   Total CK 400 (H) 38 - 234 U/L    Comment: Performed at [facility], [place] [address] [place], [place] [id number]  Basic metabolic panel     Status: Abnormal   Collection Time: 04/16/22  3:03 AM  Result Value Ref Range   Sodium 136 135 - 145 mmol/L   Potassium 3.9 3.5 - 5.1 mmol/L   Chloride 112 (H) 98 - 111 mmol/L   CO2 20 (L) 22 - 32 mmol/L   Glucose, Bld 128 (H) 70 - 99 mg/dL    Comment: Glucose reference range applies only to samples taken after fasting for at least 8 hours.   BUN 6 6 - 20 mg/dL   Creatinine, Ser 0.57 0.44 - 1.00 mg/dL   Calcium 8.2 (L) 8.9 - 10.3 mg/dL   GFR, Estimated >60 >60 mL/min    Comment: (NOTE) Calculated using the CKD-EPI Creatinine Equation (2021)    Anion gap 4 (L) 5 - 15    Comment: Performed at [facility], [place] [address], [place], [place] [address]  CBC     Status: Abnormal   Collection Time: 04/16/22  3:03 AM  Result Value Ref Range   WBC 2.6 (L) 4.0 - 10.5 K/uL   RBC 4.11 3.87 - 5.11 MIL/uL   Hemoglobin 10.6 (L) 12.0 - 15.0 g/dL   HCT 34.8 (L) 36.0 -  46.0 %   MCV 84.7 80.0 - 100.0 fL  MCH 25.8 (L) 26.0 - 34.0 pg   MCHC 30.5 30.0 - 36.0 g/dL   RDW 14.0 11.5 - 15.5 %   Platelets 118 (L) 150 - 400 K/uL    Comment: CONSISTENT WITH PREVIOUS RESULT REPEATED TO VERIFY    nRBC 0.0 0.0 - 0.2 %    Comment: Performed at [facility], [place] [address], [place], [place] [address]  Magnesium     Status: None   Collection Time: 04/16/22  3:03 AM  Result Value Ref Range   Magnesium 2.0 1.7 - 2.4 mg/dL    Comment: Performed at [facility], [place] [address], [place], [place] [address]  Vitamin B12     Status: None   Collection Time: 04/16/22  3:03 AM  Result Value Ref Range   Vitamin B-12 274 180 - 914 pg/mL    Comment: (NOTE) This assay is not validated for testing neonatal or myeloproliferative syndrome specimens for Vitamin B12 levels. Performed at [facility], [place] [address] [place],  [address]   TSH     Status: Abnormal   Collection Time: 04/16/22  3:03 AM  Result Value Ref Range   TSH 0.343 (L) 0.350 - 4.500 uIU/mL    Comment: Performed by a 3rd Generation assay with a functional sensitivity of <=0.01 uIU/mL. Performed at [facility], [place] [address] [place],  [address]     Current Facility-Administered Medications  Medication Dose Route Frequency Provider Last Rate Last Admin   0.9 %  sodium chloride infusion   Intravenous Continuous Palumbo, April, MD 125 mL/hr at 04/16/22 1200 New Bag at 04/16/22 1200   acetaminophen (TYLENOL) tablet 650 mg  650 mg Oral Q6H PRN Damita Lack, MD   650 mg at 04/16/22 0443   alum & mag hydroxide-simeth (MAALOX/MYLANTA) 200-200-20 MG/5ML suspension 30 mL  30 mL Oral Q4H PRN Amin, Ankit Chirag, MD       buprenorphine-naloxone (SUBOXONE) 8-2 mg per SL tablet 1 tablet  1 tablet Sublingual TID Damita Lack, MD   1 tablet at 04/16/22 1156   Chlorhexidine Gluconate Cloth 2 % PADS 6 each  6 each Topical  Daily Olalere, Adewale A, MD   6 each at 04/16/22 1030   enoxaparin (LOVENOX) injection 40 mg  40 mg Subcutaneous Q24H Tawnya Crook, RPH   40 mg at 04/16/22 1024   famotidine (PEPCID) tablet 20 mg  20 mg Oral BID Georgette Shell, MD       gabapentin (NEURONTIN) capsule 600 mg  600 mg Oral BID Amin, Ankit Chirag, MD   600 mg at 04/16/22 1023   guaiFENesin (ROBITUSSIN) 100 MG/5ML liquid 5 mL  5 mL Oral Q4H PRN Amin, Ankit Chirag, MD   5 mL at 04/16/22 1022   hydrocortisone (ANUSOL-HC) 2.5 % rectal cream 1 Application  1 Application Topical QID PRN Amin, Ankit Chirag, MD       hydrocortisone cream 1 % 1 Application  1 Application Topical TID PRN Amin, Ankit Chirag, MD       ipratropium-albuterol (DUONEB) 0.5-2.5 (3) MG/3ML nebulizer solution 3 mL  3 mL Nebulization Q4H PRN Amin, Ankit Chirag, MD   3 mL at 04/15/22 1657   lip balm (CARMEX) ointment 1 Application  1 Application Topical PRN Amin, Ankit Chirag, MD       loratadine (CLARITIN) tablet 10 mg  10 mg Oral Daily PRN Amin, Ankit Chirag, MD       metoprolol tartrate (LOPRESSOR)  injection 5 mg  5 mg Intravenous Q4H PRN Amin, Ankit Chirag, MD       Muscle Rub CREA 1 Application  1 Application Topical PRN Amin, Ankit Chirag, MD       ondansetron (ZOFRAN) injection 4 mg  4 mg Intravenous Q6H PRN Rigoberto Noel, MD       Oral care mouth rinse  15 mL Mouth Rinse 4 times per day Candee Furbish, MD       phenol (CHLORASEPTIC) mouth spray 1 spray  1 spray Mouth/Throat PRN Damita Lack, MD   1 spray at 04/15/22 1930   polyvinyl alcohol (LIQUIFILM TEARS) 1.4 % ophthalmic solution 1 drop  1 drop Both Eyes PRN Amin, Ankit Chirag, MD       Racepinephrine HCl 2.25 % nebulizer solution 0.5 mL  0.5 mL Nebulization Q3H PRN Amin, Ankit Chirag, MD   0.5 mL at 04/15/22 2139   sodium chloride (OCEAN) 0.65 % nasal spray 1 spray  1 spray Each Nare PRN Amin, Ankit Chirag, MD       traZODone (DESYREL) tablet 50 mg  50 mg Oral QHS PRN Damita Lack, MD    50 mg at 04/15/22 2118    Musculoskeletal:   Psychiatric Specialty Exam:  Presentation  General Appearance:  Appropriate for Environment  Eye Contact: Good  Speech: Clear and Coherent; Slow  Speech Volume: Normal  Handedness: Right   Mood and Affect  Mood: Dysphoric; Depressed  Affect: Blunt; Depressed   Thought Process  Thought Processes: Coherent; Linear  Descriptions of Associations:Intact  Orientation:Full (Time, Place and Person)  Thought Content:Logical  History of Schizophrenia/Schizoaffective disorder:No data recorded Duration of Psychotic Symptoms:No data recorded Hallucinations:Hallucinations: None  Ideas of Reference:None  Suicidal Thoughts:Suicidal Thoughts: No  Homicidal Thoughts:Homicidal Thoughts: No   Sensorium  Memory: Immediate Fair; Recent Fair; Remote Fair  Judgment: Fair  Insight: Fair   Community education officer  Concentration: Fair  Attention Span: Fair  Recall: [employer] of Knowledge: Fair  Language: Fair   Psychomotor Activity  Psychomotor Activity: Psychomotor Activity: Normal   Assets  Assets: Communication Skills; Desire for Improvement; Social Support; Financial Resources/Insurance   Sleep  Sleep: Sleep: Fair   Physical Exam: Physical Exam Vitals and nursing note reviewed.  Constitutional:      Appearance: Normal appearance.  Neurological:     Mental Status: She is alert.  Psychiatric:        Attention and Perception: Attention and perception normal.        Mood and Affect: Affect normal. Mood is depressed.        Speech: Speech normal.        Behavior: Behavior normal. Behavior is cooperative.        Thought Content: Thought content includes suicidal ideation. Thought content includes suicidal plan.        Cognition and Memory: Cognition and memory normal.        Judgment: Judgment is impulsive.    Review of Systems  Psychiatric/Behavioral:  Positive for depression, substance  abuse and suicidal ideas.   All other systems reviewed and are negative.  Blood pressure 127/87, pulse (!) 45, temperature 98 F (36.7 C), resp. rate 20, height 5\' 7"  (1.702 m), weight 91.7 kg, SpO2 98 %, not currently breastfeeding. Body mass index is 31.66 kg/m.   Treatment Plan: Recommend inpatient psychiatry once medically stable. -Recommend continuing CIWA protocol.  Patient with recent history of benzodiazepine use disorder. -Patient not under IVC, will initiate at this  time. TOC contacted.  -Once medically stable recommend TOC consult for referral for inpatient psychiatric hospitalization for medical management, crisis stabilization, and coping skills/behavior therapy.  Labs reviewed multiple abnormalities noted to include leukocytopenia 2.6, pancytopenia 118, hypophosphatemia (1.2), hypocalcemia (8.2), elevated CK (400).  Urine drug screen positive for benzodiazepine, opiates, cocaine.  EKG obtained on 319, QTc of 441.  TSH obtained 0.343; additional free T4 was added, also abnormal at 0.60.  B12 within normal limits (274).  Did notify primary team about lab abnormalities.  It should also be noted patient has wide variation in heart rate fluctuation ranging from 43-108.  Disposition: Recommend psychiatric Inpatient admission when medically cleared.  Psychiatry to continue to follow   Suella Broad, FNP 04/16/2022 12:32 PM
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
## 2022-04-16 NOTE — Progress Notes (Signed)
Pt sister, Kally Krummel, took patient bag of belongings that was left at nursing station home with her. She left 2 pair of underwear, a bar of soap and some deodorant that writer placed in paper bag. Bag labeled with pt label and placed in SI cubby.

## 2022-04-16 NOTE — Progress Notes (Signed)
Nutrition Follow-up  DOCUMENTATION CODES:   Not applicable  INTERVENTION:  Continue regular diet    NUTRITION DIAGNOSIS:   Inadequate oral intake related to inability to eat as evidenced by NPO status.  GOAL:   Patient will meet greater than or equal to 90% of their needs -goal unmet  MONITOR:   Labs, Weight trends, PO intake  REASON FOR ASSESSMENT:   Ventilator, Consult Enteral/tube feeding initiation and management  ASSESSMENT:   27 year old female brought in by [employer] for unresponsiveness. She was arrested after a domestic dispute with mother and taken to jail around 1 AM. She possibly took cocaine, heroin or suboxone or gabapentin, unknown time of use. On arrival to ED, she was only responsive to sternal rub and had pinpoint pupils. She was given Narcan x2 then intubated for airway protection. UDS positive for cocaine, benzodiazepines, and opiates.  Labs: Glu 128, phos 1.2  Meds: suboxone, pepcid, solu-medrol, mouth rinse  Wt: stable wt, slight fluctuations; CBW 91.7 kg (202#)  PO: no po documented at this time; TF's d/c'd  I/O's:  + 11L   Patient was extubated, advanced to po diet, OG tube d/c'd, and moved off the ICU.   RD observed patient walking through the hallway  Visited patient at bedside whose mother was present and sitter also in the room. Patient was sitting at EOB with her head on the tray table. She did not answer to her name. Her mother spoke for her and states she has been eating around 50% of her trays. RD observed cake, pudding and other snack on bedside table. Per patient's mother-- no N/V/D/C.     Diet Order:   Diet Order             Diet regular Room service appropriate? Yes; Fluid consistency: Thin  Diet effective now                   EDUCATION NEEDS:   No education needs have been identified at this time  Skin:  Skin Assessment: Reviewed RN Assessment  Last BM:  unknown  Height:   Ht Readings from Last 1 Encounters:   04/16/22 5\' 6"  (1.676 m)    Weight:   Wt Readings from Last 1 Encounters:  04/16/22 91.9 kg     BMI:  Body mass index is 32.7 kg/m.  Estimated Nutritional Needs:   Kcal:  1800  Protein:  75  Fluid:  >/= 2 L/day    Trey Paula, RDN, LDN  Clinical Nutrition

## 2022-04-16 NOTE — TOC Progression Note (Addendum)
Transition of Care [facility]) - Progression Note    Patient Details  Name: [name] MRN: [id number] Date of Birth: Sep 06, 1995  Transition [facility]) CM/SW [place], RN Phone Number: 04/16/2022, 5:37 PM  Clinical Narrative:   Late Documentation-  TOC leadership located IVC folder with 4 completed copies. This RNCM provided 4 completed copies IVC paperwork to 4th floor nursing secretary. Patient has been faxed out to multiple IP Psych facilities. Will await IP psych bed availability.  TOC will continue to follow.      Barriers to Discharge: Continued Medical Work up  Expected Discharge Plan and Services In-house Referral: NA Discharge Planning Services: CM Consult Post Acute Care Choice: NA Living arrangements for the past 2 months: [place]                 DME Arranged: N/A DME Agency: NA       HH Arranged: NA HH Agency: NA         Social Determinants of Health (SDOH) Interventions SDOH Screenings   Food Insecurity: No Food Insecurity (04/14/2022)  Transportation Needs: Unmet Transportation Needs (04/16/2022)  Utilities: Patient Declined (04/16/2022)  Alcohol Screen: Medium Risk (04/10/2017)  Depression (PHQ2-9): Medium Risk (03/12/2021)  Tobacco Use: High Risk (04/13/2022)    Readmission Risk Interventions    04/15/2022    6:04 PM  Readmission Risk Prevention Plan  Transportation Screening Complete  PCP or Specialist Appt within 5-7 Days Complete  Home Care Screening Complete  Medication Review (RN CM) Complete

## 2022-04-17 ENCOUNTER — Other Ambulatory Visit (HOSPITAL_COMMUNITY)

## 2022-04-17 DIAGNOSIS — T40601A Poisoning by unspecified narcotics, accidental (unintentional), initial encounter: Secondary | ICD-10-CM | POA: Diagnosis not present

## 2022-04-17 LAB — COMPREHENSIVE METABOLIC PANEL
ALT: 24 U/L (ref 0–44)
AST: 23 U/L (ref 15–41)
Albumin: 3.2 g/dL — ABNORMAL LOW (ref 3.5–5.0)
Alkaline Phosphatase: 50 U/L (ref 38–126)
Anion gap: 5 (ref 5–15)
BUN: 16 mg/dL (ref 6–20)
CO2: 23 mmol/L (ref 22–32)
Calcium: 8.2 mg/dL — ABNORMAL LOW (ref 8.9–10.3)
Chloride: 113 mmol/L — ABNORMAL HIGH (ref 98–111)
Creatinine, Ser: 0.54 mg/dL (ref 0.44–1.00)
GFR, Estimated: >60 mL/min (ref >60)
Glucose, Bld: 96 mg/dL (ref 70–99)
Potassium: 3.6 mmol/L (ref 3.5–5.1)
Sodium: 141 mmol/L (ref 135–145)
Total Bilirubin: 0.4 mg/dL (ref 0.3–1.2)
Total Protein: 5.7 g/dL — ABNORMAL LOW (ref 6.5–8.1)

## 2022-04-17 LAB — MAGNESIUM: Magnesium: 1.8 mg/dL (ref 1.7–2.4)

## 2022-04-17 LAB — CULTURE, RESPIRATORY W GRAM STAIN

## 2022-04-17 LAB — CBC
HCT: 29.7 % — ABNORMAL LOW (ref 36.0–46.0)
Hemoglobin: 9.4 g/dL — ABNORMAL LOW (ref 12.0–15.0)
MCH: 26.5 pg (ref 26.0–34.0)
MCHC: 31.6 g/dL (ref 30.0–36.0)
MCV: 83.7 fL (ref 80.0–100.0)
Platelets: 213 10*3/uL (ref 150–400)
RBC: 3.55 MIL/uL — ABNORMAL LOW (ref 3.87–5.11)
RDW: 13.9 % (ref 11.5–15.5)
WBC: 4.6 10*3/uL (ref 4.0–10.5)
nRBC: 0 % (ref 0.0–0.2)

## 2022-04-17 LAB — CK: Total CK: 119 U/L (ref 38–234)

## 2022-04-17 LAB — PHOSPHORUS: Phosphorus: 3 mg/dL (ref 2.5–4.6)

## 2022-04-17 MED ORDER — LORATADINE 10 MG PO TABS: 10.0000 mg | ORAL_TABLET | Freq: Every day | ORAL | Status: DC | PRN

## 2022-04-17 MED ORDER — POLYVINYL ALCOHOL 1.4 % OP SOLN: 1.0000 [drp] | OPHTHALMIC | 0 refills | Status: DC | PRN

## 2022-04-17 MED ORDER — NICOTINE 14 MG/24HR TD PT24: 14.0000 mg | MEDICATED_PATCH | Freq: Every day | TRANSDERMAL | 0 refills | Status: DC

## 2022-04-17 MED ORDER — HYDROCORTISONE (PERIANAL) 2.5 % EX CREA: 1.0000 | TOPICAL_CREAM | Freq: Four times a day (QID) | CUTANEOUS | 0 refills | Status: DC | PRN

## 2022-04-17 NOTE — Consult Note (Signed)
"   27 year old brought in by [facility] for  unresponsiveness.  She was arrested after a domestic dispute with mother  and taken to jail around 1 AM.  She possibly took cocaine, heroin or Suboxone or gabapentin, unknown time of use , was poorly responsive and brought to ED.  On arrival to ED, she was only responsive to sternal rub and had pinpoint pupils in the ED. given Narcan x2 , then intubated for airway protection . Labs significant for BUN/creatinine of 18/1.4, normal electrolytes, UDS positive for cocaine, benzodiazepines and opiate. "  Psych consult has been following this patient, or inpatient recommendations.  Patient has been accepted to [facility].  She is currently under IVC which will continue to be upheld until the accepted into the 24-hour facility, in which a second examination will be completed at that time.  Considering the above, sheriff will provide transportation.  Psychiatry consult service will now sign off at this time.

## 2022-04-17 NOTE — Plan of Care (Signed)
  Problem: Clinical Measurements: Goal: Diagnostic test results will improve Outcome: Progressing Goal: Cardiovascular complication will be avoided Outcome: Progressing   Problem: Coping: Goal: Level of anxiety will decrease Outcome: Progressing   Problem: Safety: Goal: Ability to remain free from injury will improve Outcome: Progressing   

## 2022-04-17 NOTE — Progress Notes (Signed)
[place] facility to give report x 2. No answer, left number for request to return call. Morey Hummingbird RN returned call. Report given.Confirmed with nurse suboxone administered this am.

## 2022-04-17 NOTE — Progress Notes (Signed)
Per Myriam Jacobson RN, Pt was assisted for discharge through charge RN, & police, who was called for transportation via TOC, while documenting RN was assisting another patient with discharge off unit.

## 2022-04-17 NOTE — Plan of Care (Signed)
  Problem: Education: Goal: Knowledge of General Education information will improve Description: Including pain rating scale, medication(s)/side effects and non-pharmacologic comfort measures 04/17/2022 1342 by Marlou Sa, RN Outcome: Adequate for Discharge 04/17/2022 1342 by Marlou Sa, RN Outcome: Progressing   Problem: Health Behavior/Discharge Planning: Goal: Ability to manage health-related needs will improve 04/17/2022 1342 by Marlou Sa, RN Outcome: Adequate for Discharge 04/17/2022 1342 by Marlou Sa, RN Outcome: Progressing   Problem: Clinical Measurements: Goal: Ability to maintain clinical measurements within normal limits will improve 04/17/2022 1342 by Marlou Sa, RN Outcome: Adequate for Discharge 04/17/2022 1342 by Marlou Sa, RN Outcome: Progressing Goal: Will remain free from infection 04/17/2022 1342 by Marlou Sa, RN Outcome: Adequate for Discharge 04/17/2022 1342 by Marlou Sa, RN Outcome: Progressing Goal: Diagnostic test results will improve 04/17/2022 1342 by Marlou Sa, RN Outcome: Adequate for Discharge 04/17/2022 1342 by Marlou Sa, RN Outcome: Progressing Goal: Respiratory complications will improve 04/17/2022 1342 by Marlou Sa, RN Outcome: Adequate for Discharge 04/17/2022 1342 by Marlou Sa, RN Outcome: Progressing Goal: Cardiovascular complication will be avoided 04/17/2022 1342 by Marlou Sa, RN Outcome: Adequate for Discharge 04/17/2022 1342 by Marlou Sa, RN Outcome: Progressing   Problem: Activity: Goal: Risk for activity intolerance will decrease 04/17/2022 1342 by Marlou Sa, RN Outcome: Adequate for Discharge 04/17/2022 1342 by Marlou Sa, RN Outcome: Progressing   Problem: Nutrition: Goal: Adequate nutrition will be maintained 04/17/2022 1342 by Marlou Sa, RN Outcome: Adequate for Discharge 04/17/2022 1342 by Marlou Sa, RN Outcome: Progressing   Problem: Coping: Goal: Level of anxiety will  decrease 04/17/2022 1342 by Marlou Sa, RN Outcome: Adequate for Discharge 04/17/2022 1342 by Marlou Sa, RN Outcome: Progressing   Problem: Elimination: Goal: Will not experience complications related to bowel motility 04/17/2022 1342 by Marlou Sa, RN Outcome: Adequate for Discharge 04/17/2022 1342 by Marlou Sa, RN Outcome: Progressing Goal: Will not experience complications related to urinary retention 04/17/2022 1342 by Marlou Sa, RN Outcome: Adequate for Discharge 04/17/2022 1342 by Marlou Sa, RN Outcome: Progressing   Problem: Pain Managment: Goal: General experience of comfort will improve 04/17/2022 1342 by Marlou Sa, RN Outcome: Adequate for Discharge 04/17/2022 1342 by Marlou Sa, RN Outcome: Progressing   Problem: Safety: Goal: Ability to remain free from injury will improve 04/17/2022 1342 by Marlou Sa, RN Outcome: Adequate for Discharge 04/17/2022 1342 by Marlou Sa, RN Outcome: Progressing   Problem: Skin Integrity: Goal: Risk for impaired skin integrity will decrease 04/17/2022 1342 by Marlou Sa, RN Outcome: Adequate for Discharge 04/17/2022 1342 by Marlou Sa, RN Outcome: Progressing   Problem: Safety: Goal: Non-violent Restraint(s) 04/17/2022 1342 by Marlou Sa, RN Outcome: Adequate for Discharge 04/17/2022 1342 by Marlou Sa, RN Outcome: Progressing    Pt is being transferred to [facility]. For continuation of care

## 2022-04-17 NOTE — Discharge Summary (Signed)
Physician Discharge Summary  [name] [id number] DOB: 10/12/1995 DOA: 04/13/2022  PCP: Merryl Hacker, No  Admit date: 04/13/2022 Discharge date: 04/17/2022  Admitted From: Home Disposition: Inpatient psych facility   Recommendations for Outpatient Follow-up:  Follow up with PCP in 1-2 weeks Please obtain BMP/CBC/EKG/PHOS in one week  Home Health:NONE Equipment/Devices: None  Discharge Condition: Stable CODE STATUS: Full code Diet recommendation: Cardiac Brief/Interim Summary:  89 year old brought in by [facility] for being unresponsive. 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 In the ED patient was unresponsive with pinpoint pupils. Narcan was given and intubated for airway protection.    Discharge Diagnoses:  Principal Problem:   Overdose opiate, accidental or unintentional, initial encounter ([place])  Acute hypoxic respiratory failure Acute toxic metabolic encephalopathy Polysubstance abuse -UDS positive for polysubstance including cocaine, benzodiazepine and opioids.  Initially intubated for airway protection and extubated on 3/18.  She was treated with supportive care, bronchodilators as needed.  Passed speech and swallow.resumed home Suboxone and gabapentin - CT head negative - Psychiatry consult recommended IP psych admit   Acute kidney injury-resolved creatinine 0.54 on discharge -Admission creatinine 1.43, resolved with IV fluid.   Severe hypokalemia/hypophosphatemia-resolved with repletion.  Potassium 3.6 phosphorus 3.0 on discharge   Mild rhabdomyolysis-resolved with IV fluids.  CPK 119 on discharge.  Neutropenia resolved.  WBC 4.6 on discharge.  Thrombocytopenia platelets 213 on discharge.  Abnormal thyroid values TSH 0.34 and T40.60.  Check thyroid function  studies in 6 weeks.  Bradycardia resolved.  Now with normal sinus rhythm heart rate in the 90s.  QTc 436.  Nutrition Problem: Inadequate oral intake Etiology: inability to eat Signs/Symptoms: NPO status Interventions: Tube feeding, Prostat Estimated body mass index is 33.2 kg/m as calculated from the following:   Height as of this encounter: 5\' 6"  (1.676 m).   Weight as of this encounter: 93.3 kg.  Discharge Instructions  Discharge Instructions     Diet - low sodium heart healthy   Complete by: As directed    Increase activity slowly   Complete by: As directed       Allergies as of 04/17/2022       Reactions   Blueberry Fruit Extract Anaphylaxis   Contrast Media [iodinated Contrast Media] Anaphylaxis, Hives, Rash   Codeine Hives, Itching, Nausea And Vomiting   Latex Hives, Other (See Comments)   Welts, also   Omnipaque [iohexol] Hives, Itching, Nausea And Vomiting, Swelling        Medication List     STOP taking these medications    hydrOXYzine 25 MG tablet Commonly known as: ATARAX   metroNIDAZOLE 500 MG tablet Commonly known as: FLAGYL   norethindrone 0.35 MG tablet Commonly known as: Ortho Micronor   tiZANidine 4 MG tablet Commonly known as: ZANAFLEX   traZODone 100 MG tablet Commonly known as: DESYREL       TAKE these medications    bismuth subsalicylate [id number] suspension Commonly known as: PEPTO BISMOL Take 30 mLs by mouth every 6 (six) hours as needed for indigestion.   Buprenorphine HCl-Naloxone HCl 8-2 MG Film Place 1 Film under the tongue See admin instructions. Place 1 film under the tongue in the morning, at lunchtime, and in the evening   gabapentin 600 MG tablet Commonly known as: NEURONTIN Take 600 mg  by mouth every 12 (twelve) hours.   hydrocortisone 2.5 % rectal cream Commonly known as: ANUSOL-HC Apply 1 Application topically 4 (four) times daily as needed for hemorrhoids.   ibuprofen 800 MG tablet Commonly known as:  ADVIL Take 800 mg by mouth every 8 (eight) hours as needed (for pain).   loratadine 10 MG tablet Commonly known as: CLARITIN Take 1 tablet (10 mg total) by mouth daily as needed for allergies.   nicotine 14 mg/24hr patch Commonly known as: NICODERM CQ - dosed in mg/24 hours Place 1 patch (14 mg total) onto the skin daily. Start taking on: April 18, 2022   polyvinyl alcohol 1.4 % ophthalmic solution Commonly known as: LIQUIFILM TEARS Place 1 drop into both eyes as needed for dry eyes.   TYLENOL 500 MG tablet Generic drug: acetaminophen Take 1,000 mg by mouth every 6 (six) hours as needed (for pain). What changed: Another medication with the same name was removed. Continue taking this medication, and follow the directions you see here.        Follow-up Information     [place]. Schedule an appointment as soon as possible for a visit in 2 day(s).   Why: Call to schedule a follow up PCP establishment appointment Contact information: [place] [address] [phone]               Allergies  Allergen Reactions   Blueberry Fruit Extract Anaphylaxis   Contrast Media [Iodinated Contrast Media] Anaphylaxis, Hives and Rash   Codeine Hives, Itching and Nausea And Vomiting   Latex Hives and Other (See Comments)    Welts, also   Omnipaque [Iohexol] Hives, Itching, Nausea And Vomiting and Swelling    Consultations: psych   Procedures/Studies: DG Chest Port 1 View  Result Date: 04/14/2022 CLINICAL DATA:  Pneumonia. EXAM: PORTABLE CHEST 1 VIEW COMPARISON:  04/13/2022. FINDINGS: 0825 hours. Apparent interval retraction of the endotracheal tube, which now projects over the midthoracic trachea. Enteric tube tip and side port course below the diaphragm beyond the field of view. Increased opacity in the left lung base with associated volume loss, consistent with atelectasis. Right lung is clear. No  pneumothorax or definite pleural effusion. IMPRESSION: 1. Interval retraction of the endotracheal tube now projecting over the midthoracic trachea. 2. Increased opacity in the left lung base with associated volume loss, consistent with atelectasis. Electronically Signed   By: Emmit Alexanders M.D.   On: 04/14/2022 08:57   DG Abd Portable 1V  Result Date: 04/13/2022 CLINICAL DATA:  Nasogastric tube placement. EXAM: PORTABLE ABDOMEN - 1 VIEW COMPARISON:  08/29/2014 and 12/24/2020 FINDINGS: Endotracheal tube has tip 4.7 cm above the carina. Nasogastric tube courses into the stomach as side-port is noted over the stomach in the left upper quadrant as tip is not visualized, but likely within the mid to distal stomach. Visualized bowel gas pattern is nonobstructive. No free peritoneal air. Remainder of the exam is unchanged. IMPRESSION: 1. Nonobstructive bowel gas pattern. 2. Tubes and lines as described. Electronically Signed   By: Marin Olp M.D.   On: 04/13/2022 09:52   Portable Chest x-ray  Result Date: 04/13/2022 CLINICAL DATA:  Intubation.  Drug overdose EXAM: PORTABLE CHEST 1 VIEW COMPARISON:  12/24/2020 FINDINGS: Endotracheal tube with tip at the clavicular heads, proximity to the carina accentuated by positioning. An enteric tube reaches the stomach. Low volume chest with indistinct density at the left base. No effusion or pneumothorax. Normal  heart size. IMPRESSION: 1. Unremarkable hardware. 2. Atelectasis or infiltrate in the left lower lobe. Electronically Signed   By: Jorje Guild M.D.   On: 04/13/2022 06:06   CT Head Wo Contrast  Result Date: 04/13/2022 CLINICAL DATA:  Blunt poly trauma [place].  Drug overdose. EXAM: CT HEAD WITHOUT CONTRAST TECHNIQUE: Contiguous axial images were obtained from the base of the skull through the vertex without intravenous contrast. RADIATION DOSE REDUCTION: This exam was performed according to the departmental dose-optimization program which includes automated  exposure control, adjustment of the mA and/or kV according to patient size and/or use of iterative reconstruction technique. COMPARISON:  None Available. FINDINGS: Brain: No evidence of acute infarction, hemorrhage, hydrocephalus, extra-axial collection or mass lesion/mass effect. Intermittently prominent deep cerebellar white matter but symmetric and likely accentuated by streak artifact. Vascular: No hyperdense vessel or unexpected calcification. Skull: Normal. Negative for fracture or focal lesion. Sinuses/Orbits: No acute finding. IMPRESSION: No acute finding. Electronically Signed   By: Jorje Guild M.D.   On: 04/13/2022 05:54   (Echo, Carotid, EGD, Colonoscopy, ERCP)    Subjective: Patient moving around in room restless wants to go home no nausea vomiting diarrhea sitter in the room Eating all meals  Discharge Exam: Vitals:   04/16/22 2300 04/17/22 0503  BP: 112/62 112/66  Pulse: 92 81  Resp: 20 20  Temp: 98.4 F (36.9 C) (!) 97.4 F (36.3 C)  SpO2: 100% 100%   Vitals:   04/16/22 1530 04/16/22 1900 04/16/22 2300 04/17/22 0503  BP:  120/86 112/62 112/66  Pulse:  80 92 81  Resp: 15 20 20 20   Temp:  98.4 F (36.9 C) 98.4 F (36.9 C) (!) 97.4 F (36.3 C)  TempSrc:  Oral Oral Oral  SpO2:  99% 100% 100%  Weight: 91.9 kg   93.3 kg  Height: 5\' 6"  (1.676 m)       General: Pt is alert, awake, not in acute distress Cardiovascular: RRR, S1/S2 +, no rubs, no gallops Respiratory: CTA bilaterally, no wheezing, no rhonchi Abdominal: Soft, NT, ND, bowel sounds + Extremities: no edema, no cyanosis    The results of significant diagnostics from this hospitalization (including imaging, microbiology, ancillary and laboratory) are listed below for reference.     Microbiology: Recent Results (from the past 240 hour(s))  Resp panel by RT-PCR (RSV, Flu A&B, Covid) Anterior Nasal Swab     Status: None   Collection Time: 04/13/22  3:33 AM   Specimen: Anterior Nasal Swab  Result Value  Ref Range Status   SARS Coronavirus 2 by RT PCR NEGATIVE NEGATIVE Final    Comment: (NOTE) SARS-CoV-2 target nucleic acids are NOT DETECTED.  The SARS-CoV-2 RNA is generally detectable in upper respiratory specimens during the acute phase of infection. The lowest concentration of SARS-CoV-2 viral copies this assay can detect is 138 copies/mL. A negative result does not preclude SARS-Cov-2 infection and should not be used as the sole basis for treatment or other patient management decisions. A negative result may occur with  improper specimen collection/handling, submission of specimen other than nasopharyngeal swab, presence of viral mutation(s) within the areas targeted by this assay, and inadequate number of viral copies(<138 copies/mL). A negative result must be combined with clinical observations, patient history, and epidemiological information. The expected result is Negative.  Fact Sheet for Patients:  EntrepreneurPulse.com.au  Fact Sheet for Healthcare Providers:  IncredibleEmployment.be  This test is no t yet approved or cleared by the [place] FDA and  has been  authorized for detection and/or diagnosis of SARS-CoV-2 by FDA under an Emergency Use Authorization (EUA). This EUA will remain  in effect (meaning this test can be used) for the duration of the COVID-19 declaration under Section 564(b)(1) of the Act, 21 U.S.C.section 360bbb-3(b)(1), unless the authorization is terminated  or revoked sooner.       Influenza A by PCR NEGATIVE NEGATIVE Final   Influenza B by PCR NEGATIVE NEGATIVE Final    Comment: (NOTE) The Xpert Xpress SARS-CoV-2/FLU/RSV plus assay is intended as an aid in the diagnosis of influenza from Nasopharyngeal swab specimens and should not be used as a sole basis for treatment. Nasal washings and aspirates are unacceptable for Xpert Xpress SARS-CoV-2/FLU/RSV testing.  Fact Sheet for  Patients: EntrepreneurPulse.com.au  Fact Sheet for Healthcare Providers: IncredibleEmployment.be  This test is not yet approved or cleared by the [place] FDA and has been authorized for detection and/or diagnosis of SARS-CoV-2 by FDA under an Emergency Use Authorization (EUA). This EUA will remain in effect (meaning this test can be used) for the duration of the COVID-19 declaration under Section 564(b)(1) of the Act, 21 U.S.C. section 360bbb-3(b)(1), unless the authorization is terminated or revoked.     Resp Syncytial Virus by PCR NEGATIVE NEGATIVE Final    Comment: (NOTE) Fact Sheet for Patients: EntrepreneurPulse.com.au  Fact Sheet for Healthcare Providers: IncredibleEmployment.be  This test is not yet approved or cleared by the [place] FDA and has been authorized for detection and/or diagnosis of SARS-CoV-2 by FDA under an Emergency Use Authorization (EUA). This EUA will remain in effect (meaning this test can be used) for the duration of the COVID-19 declaration under Section 564(b)(1) of the Act, 21 U.S.C. section 360bbb-3(b)(1), unless the authorization is terminated or revoked.  Performed at [facility], [place] [address], [place], [place] [address]   MRSA Next Gen by PCR, Nasal     Status: None   Collection Time: 04/13/22  8:41 AM   Specimen: Nasal Mucosa; Nasal Swab  Result Value Ref Range Status   MRSA by PCR Next Gen NOT DETECTED NOT DETECTED Final    Comment: (NOTE) The GeneXpert MRSA Assay (FDA approved for NASAL specimens only), is one component of a comprehensive MRSA colonization surveillance program. It is not intended to diagnose MRSA infection nor to guide or monitor treatment for MRSA infections. Test performance is not FDA approved in patients less than 64 years old. Performed at [facility], [place] [address], [place], [place]  [address]   Culture, Respiratory w Gram Stain     Status: None (Preliminary result)   Collection Time: 04/14/22  8:49 AM   Specimen: Tracheal Aspirate; Respiratory  Result Value Ref Range Status   Specimen Description   Final    TRACHEAL ASPIRATE Performed at [place] [address], [place], [place] [address]    Special Requests   Final    NONE Performed at [facility], [place] [address], [place], [place] [address]    Gram Stain   Final    FEW WBC PRESENT,BOTH PMN AND MONONUCLEAR RARE GRAM POSITIVE COCCI IN PAIRS    Culture   Final    MODERATE STREPTOCOCCUS PNEUMONIAE REPEATING SENSITIVITY Performed at [facility], [place] [address] [place], [place] [address]    Report Status PENDING  Incomplete     Labs: BNP (last 3 results) No results for input(s): "BNP" in the last 8760 hours. Basic Metabolic Panel: Recent Labs  Lab 04/13/22 0325 04/13/22 0914 04/14/22 0833 04/14/22 1653 04/15/22 0322 04/15/22 0330 04/16/22 0301 04/16/22 0303 04/17/22 0503  NA 137  --  137  --  136  --   --  136 141  K 3.8  --  3.8  --  2.8*  --   --  3.9 3.6  CL 104  --  109  --  109  --   --  112* 113*  CO2 23  --  21*  --  20*  --   --  20* 23  GLUCOSE 83  --  98  --  180*  --   --  128* 96  BUN 18  --  19  --  8  --   --  6 16  CREATININE 1.43*  --  0.77  --  0.63  --   --  0.57 0.54  CALCIUM 9.3  --  7.5*  --  7.4*  --   --  8.2* 8.2*  MG  --    < > 2.1 1.9  --  1.9  --  2.0 1.8  PHOS  --    < > 3.0 2.5  --  1.2* 2.6  --  3.0   < > = values in this interval not displayed.   Liver Function Tests: Recent Labs  Lab 04/13/22 0325 04/17/22 0503  AST 28 23  ALT 20 24  ALKPHOS 51 50  BILITOT 0.4 0.4  PROT 6.6 5.7*  ALBUMIN 4.1 3.2*   No results for input(s): "LIPASE", "AMYLASE" in the last 168 hours. No results for input(s): "AMMONIA" in the last 168 hours. CBC: Recent Labs  Lab 04/13/22 0325 04/14/22 0833 04/16/22 0303 04/17/22 0503   WBC 5.0 5.7 2.6* 4.6  NEUTROABS 1.9  --   --   --   HGB 11.6* 10.6* 10.6* 9.4*  HCT 36.5 34.3* 34.8* 29.7*  MCV 81.8 84.5 84.7 83.7  PLT 191 121* 118* 213   Cardiac Enzymes: Recent Labs  Lab 04/13/22 0914 04/15/22 0330 04/16/22 0301 04/17/22 0503  CKTOTAL 326* 456* 400* 119   BNP: Invalid input(s): "POCBNP" CBG: Recent Labs  Lab 04/13/22 1545 04/13/22 2023 04/13/22 2349 04/14/22 0318 04/14/22 0819  GLUCAP 93 73 97 82 110*   D-Dimer No results for input(s): "DDIMER" in the last 72 hours. Hgb A1c No results for input(s): "HGBA1C" in the last 72 hours. Lipid Profile No results for input(s): "CHOL", "HDL", "LDLCALC", "TRIG", "CHOLHDL", "LDLDIRECT" in the last 72 hours. Thyroid function studies Recent Labs    04/16/22 0303  TSH 0.343*   Anemia work up Recent Labs    04/16/22 0303  VITAMINB12 274   Urinalysis    Component Value Date/Time   COLORURINE YELLOW 01/21/2021 0533   APPEARANCEUR HAZY (A) 01/21/2021 0533   LABSPEC 1.030 01/21/2021 0533   PHURINE 6.0 01/21/2021 0533   GLUCOSEU NEGATIVE 01/21/2021 0533   HGBUR LARGE (A) 01/21/2021 0533   BILIRUBINUR SMALL (A) 01/21/2021 0533   KETONESUR 20 (A) 01/21/2021 0533   PROTEINUR 30 (A) 01/21/2021 0533   UROBILINOGEN 0.2 09/21/2013 2250   NITRITE NEGATIVE 01/21/2021 0533   LEUKOCYTESUR NEGATIVE 01/21/2021 0533   Sepsis Labs Recent Labs  Lab 04/13/22 0325 04/14/22 0833 04/16/22 0303 04/17/22 0503  WBC 5.0 5.7 2.6* 4.6   Microbiology Recent Results (from the past 240 hour(s))  Resp panel by RT-PCR (RSV, Flu A&B, Covid) Anterior Nasal Swab     Status: None   Collection Time: 04/13/22  3:33 AM   Specimen: Anterior Nasal Swab  Result Value Ref Range Status   SARS Coronavirus 2 by RT PCR NEGATIVE NEGATIVE Final    Comment: (NOTE) SARS-CoV-2 target nucleic acids are NOT DETECTED.  The SARS-CoV-2 RNA is generally detectable in upper respiratory specimens during the acute phase of infection. The  lowest concentration of SARS-CoV-2 viral copies this assay can detect is 138 copies/mL. A negative result does not preclude SARS-Cov-2 infection and should not be used as the sole basis for treatment or other patient management decisions. A negative result may occur with  improper specimen collection/handling, submission of specimen other than nasopharyngeal swab, presence of viral mutation(s) within the areas targeted by this assay, and inadequate number of viral copies(<138 copies/mL). A negative result must be combined with clinical observations, patient history, and epidemiological information. The expected result is Negative.  Fact Sheet for Patients:  EntrepreneurPulse.com.au  Fact Sheet for Healthcare Providers:  IncredibleEmployment.be  This test is no t yet approved or cleared by the [place] FDA and  has been authorized for detection and/or diagnosis of SARS-CoV-2 by FDA under an Emergency Use Authorization (EUA). This EUA will remain  in effect (meaning this test can be used) for the duration of the COVID-19 declaration under Section 564(b)(1) of the Act, 21 U.S.C.section 360bbb-3(b)(1), unless the authorization is terminated  or revoked sooner.       Influenza A by PCR NEGATIVE NEGATIVE Final   Influenza B by PCR NEGATIVE NEGATIVE Final    Comment: (NOTE) The Xpert Xpress SARS-CoV-2/FLU/RSV plus assay is intended as an aid in the diagnosis of influenza from Nasopharyngeal swab specimens and should not be used as a sole basis for treatment. Nasal washings and aspirates are unacceptable for Xpert Xpress SARS-CoV-2/FLU/RSV testing.  Fact Sheet for Patients: EntrepreneurPulse.com.au  Fact Sheet for Healthcare Providers: IncredibleEmployment.be  This test is not yet approved or cleared by the [place] FDA and has been authorized for detection and/or diagnosis of SARS-CoV-2 by FDA under  an Emergency Use Authorization (EUA). This EUA will remain in effect (meaning this test can be used) for the duration of the COVID-19 declaration under Section 564(b)(1) of the Act, 21 U.S.C. section 360bbb-3(b)(1), unless the authorization is terminated or revoked.     Resp Syncytial Virus by PCR NEGATIVE NEGATIVE Final    Comment: (NOTE) Fact Sheet for Patients: EntrepreneurPulse.com.au  Fact Sheet for Healthcare Providers: IncredibleEmployment.be  This test is not yet approved or cleared by the [place] FDA and has been authorized for detection and/or diagnosis of SARS-CoV-2 by FDA under an Emergency Use Authorization (EUA). This EUA will remain in effect (meaning this test can be used) for the duration of the COVID-19 declaration under Section 564(b)(1) of the Act, 21 U.S.C. section 360bbb-3(b)(1), unless the authorization is terminated or revoked.  Performed at [facility], [place] [address], [place], [place] [address]   MRSA Next Gen by PCR, Nasal     Status: None   Collection Time: 04/13/22  8:41 AM   Specimen: Nasal Mucosa; Nasal Swab  Result Value Ref Range Status   MRSA by PCR Next Gen NOT DETECTED NOT DETECTED Final    Comment: (NOTE) The GeneXpert MRSA Assay (FDA approved for NASAL specimens only), is one component of a comprehensive MRSA colonization surveillance program. It is not intended to diagnose MRSA infection nor to guide or monitor treatment for MRSA infections. Test performance is not FDA approved in patients less than 14 years old. Performed at  [facility], [place] [address] [place], [place] [address]   Culture, Respiratory w Gram Stain     Status: None (Preliminary result)   Collection Time: 04/14/22  8:49 AM   Specimen: Tracheal Aspirate; Respiratory  Result Value Ref Range Status   Specimen Description   Final    TRACHEAL ASPIRATE Performed at [place] [address] [place], [place] [address]    Special Requests   Final    NONE Performed at [facility], [place] [address], [place], [place] [address]    Gram Stain   Final    FEW WBC PRESENT,BOTH PMN AND MONONUCLEAR RARE GRAM POSITIVE COCCI IN PAIRS    Culture   Final    MODERATE STREPTOCOCCUS PNEUMONIAE REPEATING SENSITIVITY Performed at [facility], [place] [address], [place], [place] [address]    Report Status PENDING  Incomplete     Time coordinating discharge: 35 minutes  SIGNED:  Georgette Shell, MD  [facility] 04/17/2022, 9:54 AM

## 2022-04-17 NOTE — Plan of Care (Signed)
Problem: Education: Goal: Knowledge of General Education information will improve Description: Including pain rating scale, medication(s)/side effects and non-pharmacologic comfort measures 04/17/2022 1344 by Marlou Sa, RN Outcome: Completed/Met 04/17/2022 1342 by Marlou Sa, RN Outcome: Adequate for Discharge 04/17/2022 1342 by Marlou Sa, RN Outcome: Progressing   Problem: Health Behavior/Discharge Planning: Goal: Ability to manage health-related needs will improve 04/17/2022 1344 by Marlou Sa, RN Outcome: Completed/Met 04/17/2022 1342 by Marlou Sa, RN Outcome: Adequate for Discharge 04/17/2022 1342 by Marlou Sa, RN Outcome: Progressing   Problem: Clinical Measurements: Goal: Ability to maintain clinical measurements within normal limits will improve 04/17/2022 1344 by Marlou Sa, RN Outcome: Completed/Met 04/17/2022 1342 by Marlou Sa, RN Outcome: Adequate for Discharge 04/17/2022 1342 by Marlou Sa, RN Outcome: Progressing Goal: Will remain free from infection 04/17/2022 1344 by Marlou Sa, RN Outcome: Completed/Met 04/17/2022 1342 by Marlou Sa, RN Outcome: Adequate for Discharge 04/17/2022 1342 by Marlou Sa, RN Outcome: Progressing Goal: Diagnostic test results will improve 04/17/2022 1344 by Marlou Sa, RN Outcome: Completed/Met 04/17/2022 1342 by Marlou Sa, RN Outcome: Adequate for Discharge 04/17/2022 1342 by Marlou Sa, RN Outcome: Progressing Goal: Respiratory complications will improve 04/17/2022 1344 by Marlou Sa, RN Outcome: Completed/Met 04/17/2022 1342 by Marlou Sa, RN Outcome: Adequate for Discharge 04/17/2022 1342 by Marlou Sa, RN Outcome: Progressing Goal: Cardiovascular complication will be avoided 04/17/2022 1344 by Marlou Sa, RN Outcome: Completed/Met 04/17/2022 1342 by Marlou Sa, RN Outcome: Adequate for Discharge 04/17/2022 1342 by Marlou Sa, RN Outcome: Progressing   Problem: Activity: Goal: Risk for  activity intolerance will decrease 04/17/2022 1344 by Marlou Sa, RN Outcome: Completed/Met 04/17/2022 1342 by Marlou Sa, RN Outcome: Adequate for Discharge 04/17/2022 1342 by Marlou Sa, RN Outcome: Progressing   Problem: Nutrition: Goal: Adequate nutrition will be maintained 04/17/2022 1344 by Marlou Sa, RN Outcome: Completed/Met 04/17/2022 1342 by Marlou Sa, RN Outcome: Adequate for Discharge 04/17/2022 1342 by Marlou Sa, RN Outcome: Progressing   Problem: Coping: Goal: Level of anxiety will decrease 04/17/2022 1344 by Marlou Sa, RN Outcome: Completed/Met 04/17/2022 1342 by Marlou Sa, RN Outcome: Adequate for Discharge 04/17/2022 1342 by Marlou Sa, RN Outcome: Progressing   Problem: Elimination: Goal: Will not experience complications related to bowel motility 04/17/2022 1344 by Marlou Sa, RN Outcome: Completed/Met 04/17/2022 1342 by Marlou Sa, RN Outcome: Adequate for Discharge 04/17/2022 1342 by Marlou Sa, RN Outcome: Progressing Goal: Will not experience complications related to urinary retention 04/17/2022 1344 by Marlou Sa, RN Outcome: Completed/Met 04/17/2022 1342 by Marlou Sa, RN Outcome: Adequate for Discharge 04/17/2022 1342 by Marlou Sa, RN Outcome: Progressing   Problem: Pain Managment: Goal: General experience of comfort will improve 04/17/2022 1344 by Marlou Sa, RN Outcome: Completed/Met 04/17/2022 1342 by Marlou Sa, RN Outcome: Adequate for Discharge 04/17/2022 1342 by Marlou Sa, RN Outcome: Progressing   Problem: Safety: Goal: Ability to remain free from injury will improve 04/17/2022 1344 by Marlou Sa, RN Outcome: Completed/Met 04/17/2022 1342 by Marlou Sa, RN Outcome: Adequate for Discharge 04/17/2022 1342 by Marlou Sa, RN Outcome: Progressing   Problem: Skin Integrity: Goal: Risk for impaired skin integrity will decrease 04/17/2022 1344 by Marlou Sa, RN Outcome: Completed/Met 04/17/2022 1342  by Marlou Sa, RN Outcome: Adequate for Discharge 04/17/2022 1342 by Marlou Sa, RN  Outcome: Progressing   Problem: Safety: Goal: Non-violent Restraint(s) 04/17/2022 1344 by Marlou Sa, RN Outcome: Completed/Met 04/17/2022 1342 by Marlou Sa, RN Outcome: Adequate for Discharge 04/17/2022 1342 by Marlou Sa, RN Outcome: Progressing  Report called to [place], where care will continue

## 2022-04-17 NOTE — TOC Transition Note (Signed)
Transition of Care [facility]) - CM/SW Discharge Note   Patient Details  Name: [name] MRN: [id number] Date of Birth: Oct 02, 1995  Transition of Care [facility]) CM/SW Contact:  Phyllis Ginger, RN Phone Number: 04/17/2022, 9:44 AM   Clinical Narrative:  Received call from [place] can accept today. Going to main campus, report tel#[phone]. Will update MD. Fax d/c summary once available. To be transported by [facility].    Final next level of care: [facility] Barriers to Discharge: No Barriers Identified   Patient Goals and CMS Choice CMS Medicare.gov Compare Post Acute Care list provided to:: Patient Choice offered to / list presented to : Patient  Discharge Placement                  Patient to be transferred to facility by:  [facility]) Name of family member notified:  (Patient) Patient and family notified of of transfer: 04/17/22  Discharge Plan and Services Additional resources added to the After Visit Summary for   In-house Referral: NA Discharge Planning Services: CM Consult Post Acute Care Choice: NA          DME Arranged: N/A DME Agency: NA       HH Arranged: NA HH Agency: NA        Social Determinants of Health (SDOH) Interventions SDOH Screenings   Food Insecurity: No Food Insecurity (04/14/2022)  Transportation Needs: Unmet Transportation Needs (04/16/2022)  Utilities: Patient Declined (04/16/2022)  Alcohol Screen: Medium Risk (04/10/2017)  Depression (PHQ2-9): Medium Risk (03/12/2021)  Tobacco Use: High Risk (04/13/2022)     Readmission Risk Interventions    04/15/2022    6:04 PM  Readmission Risk Prevention Plan  Transportation Screening Complete  PCP or Specialist Appt within 5-7 Days Complete  Home Care Screening Complete  Medication Review (RN CM) Complete

## 2022-04-17 NOTE — TOC Transition Note (Signed)
Transition of Care [facility]) - CM/SW Discharge Note   Patient Details  Name: [name] MRN: [id number] Date of Birth: 1995/07/19  Transition of Care [facility]) CM/SW Contact:  Dessa Phi, RN Phone Number: 04/17/2022, 12:31 PM   Clinical Narrative:Received call from [place] accepted. Going to main campus,report [phone], called out [place] sheriff [phone] for transport for IVC. No further CM needs.      Final next level of care: [facility] Barriers to Discharge: No Barriers Identified   Patient Goals and CMS Choice CMS Medicare.gov Compare Post Acute Care list provided to:: Patient Choice offered to / list presented to : Patient  Discharge Placement                  Patient to be transferred to facility by:  [facility]) Name of family member notified:  (Patient) Patient and family notified of of transfer: 04/17/22  Discharge Plan and Services Additional resources added to the After Visit Summary for   In-house Referral: NA Discharge Planning Services: CM Consult Post Acute Care Choice: NA          DME Arranged: N/A DME Agency: NA       HH Arranged: NA HH Agency: NA        Social Determinants of Health (SDOH) Interventions SDOH Screenings   Food Insecurity: No Food Insecurity (04/14/2022)  Transportation Needs: Unmet Transportation Needs (04/16/2022)  Utilities: Patient Declined (04/16/2022)  Alcohol Screen: Medium Risk (04/10/2017)  Depression (PHQ2-9): Medium Risk (03/12/2021)  Tobacco Use: High Risk (04/13/2022)     Readmission Risk Interventions    04/15/2022    6:04 PM  Readmission Risk Prevention Plan  Transportation Screening Complete  PCP or Specialist Appt within 5-7 Days Complete  Home Care Screening Complete  Medication Review (RN CM) Complete

## 2022-04-17 NOTE — Plan of Care (Deleted)
Problem: Education: Goal: Knowledge of General Education information will improve Description: Including pain rating scale, medication(s)/side effects and non-pharmacologic comfort measures 04/17/2022 1344 by Marlou Sa, RN Outcome: Completed/Met 04/17/2022 1342 by Marlou Sa, RN Outcome: Adequate for Discharge 04/17/2022 1342 by Marlou Sa, RN Outcome: Progressing   Problem: Health Behavior/Discharge Planning: Goal: Ability to manage health-related needs will improve 04/17/2022 1344 by Marlou Sa, RN Outcome: Completed/Met 04/17/2022 1342 by Marlou Sa, RN Outcome: Adequate for Discharge 04/17/2022 1342 by Marlou Sa, RN Outcome: Progressing   Problem: Clinical Measurements: Goal: Ability to maintain clinical measurements within normal limits will improve 04/17/2022 1344 by Marlou Sa, RN Outcome: Completed/Met 04/17/2022 1342 by Marlou Sa, RN Outcome: Adequate for Discharge 04/17/2022 1342 by Marlou Sa, RN Outcome: Progressing Goal: Will remain free from infection 04/17/2022 1344 by Marlou Sa, RN Outcome: Completed/Met 04/17/2022 1342 by Marlou Sa, RN Outcome: Adequate for Discharge 04/17/2022 1342 by Marlou Sa, RN Outcome: Progressing Goal: Diagnostic test results will improve 04/17/2022 1344 by Marlou Sa, RN Outcome: Completed/Met 04/17/2022 1342 by Marlou Sa, RN Outcome: Adequate for Discharge 04/17/2022 1342 by Marlou Sa, RN Outcome: Progressing Goal: Respiratory complications will improve 04/17/2022 1344 by Marlou Sa, RN Outcome: Completed/Met 04/17/2022 1342 by Marlou Sa, RN Outcome: Adequate for Discharge 04/17/2022 1342 by Marlou Sa, RN Outcome: Progressing Goal: Cardiovascular complication will be avoided 04/17/2022 1344 by Marlou Sa, RN Outcome: Completed/Met 04/17/2022 1342 by Marlou Sa, RN Outcome: Adequate for Discharge 04/17/2022 1342 by Marlou Sa, RN Outcome: Progressing   Problem: Activity: Goal: Risk for  activity intolerance will decrease 04/17/2022 1344 by Marlou Sa, RN Outcome: Completed/Met 04/17/2022 1342 by Marlou Sa, RN Outcome: Adequate for Discharge 04/17/2022 1342 by Marlou Sa, RN Outcome: Progressing   Problem: Nutrition: Goal: Adequate nutrition will be maintained 04/17/2022 1344 by Marlou Sa, RN Outcome: Completed/Met 04/17/2022 1342 by Marlou Sa, RN Outcome: Adequate for Discharge 04/17/2022 1342 by Marlou Sa, RN Outcome: Progressing   Problem: Coping: Goal: Level of anxiety will decrease 04/17/2022 1344 by Marlou Sa, RN Outcome: Completed/Met 04/17/2022 1342 by Marlou Sa, RN Outcome: Adequate for Discharge 04/17/2022 1342 by Marlou Sa, RN Outcome: Progressing   Problem: Elimination: Goal: Will not experience complications related to bowel motility 04/17/2022 1344 by Marlou Sa, RN Outcome: Completed/Met 04/17/2022 1342 by Marlou Sa, RN Outcome: Adequate for Discharge 04/17/2022 1342 by Marlou Sa, RN Outcome: Progressing Goal: Will not experience complications related to urinary retention 04/17/2022 1344 by Marlou Sa, RN Outcome: Completed/Met 04/17/2022 1342 by Marlou Sa, RN Outcome: Adequate for Discharge 04/17/2022 1342 by Marlou Sa, RN Outcome: Progressing   Problem: Pain Managment: Goal: General experience of comfort will improve 04/17/2022 1344 by Marlou Sa, RN Outcome: Completed/Met 04/17/2022 1342 by Marlou Sa, RN Outcome: Adequate for Discharge 04/17/2022 1342 by Marlou Sa, RN Outcome: Progressing   Problem: Safety: Goal: Ability to remain free from injury will improve 04/17/2022 1344 by Marlou Sa, RN Outcome: Completed/Met 04/17/2022 1342 by Marlou Sa, RN Outcome: Adequate for Discharge 04/17/2022 1342 by Marlou Sa, RN Outcome: Progressing   Problem: Skin Integrity: Goal: Risk for impaired skin integrity will decrease 04/17/2022 1344 by Marlou Sa, RN Outcome: Completed/Met 04/17/2022 1342  by Marlou Sa, RN Outcome: Adequate for Discharge 04/17/2022 1342 by Marlou Sa, RN  Outcome: Progressing   Problem: Safety: Goal: Non-violent Restraint(s) 04/17/2022 1344 by Marlou Sa, RN Outcome: Completed/Met 04/17/2022 1342 by Marlou Sa, RN Outcome: Adequate for Discharge 04/17/2022 1342 by Marlou Sa, RN Outcome: Progressing  Continuation of care @ [facility] IP psyche. Report called

## 2022-09-12 ENCOUNTER — Other Ambulatory Visit: Payer: Self-pay

## 2022-09-12 ENCOUNTER — Emergency Department (HOSPITAL_COMMUNITY): Payer: Medicaid Other

## 2022-09-12 ENCOUNTER — Encounter (HOSPITAL_COMMUNITY): Payer: Self-pay | Admitting: Emergency Medicine

## 2022-09-12 ENCOUNTER — Emergency Department (HOSPITAL_COMMUNITY)
Admission: EM | Admit: 2022-09-12 | Discharge: 2022-09-13 | Disposition: A | Discharge status: Home / Self Care | Payer: Medicaid Other | Attending: Emergency Medicine | Admitting: Emergency Medicine

## 2022-09-12 DIAGNOSIS — M546 Pain in thoracic spine: Secondary | ICD-10-CM | POA: Insufficient documentation

## 2022-09-12 DIAGNOSIS — D72819 Decreased white blood cell count, unspecified: Secondary | ICD-10-CM | POA: Diagnosis not present

## 2022-09-12 DIAGNOSIS — Z91040 Latex allergy status: Secondary | ICD-10-CM | POA: Diagnosis not present

## 2022-09-12 DIAGNOSIS — F1193 Opioid use, unspecified with withdrawal: Secondary | ICD-10-CM

## 2022-09-12 DIAGNOSIS — M545 Low back pain, unspecified: Secondary | ICD-10-CM | POA: Diagnosis not present

## 2022-09-12 DIAGNOSIS — F1123 Opioid dependence with withdrawal: Secondary | ICD-10-CM | POA: Insufficient documentation

## 2022-09-12 HISTORY — DX: Opioid use, unspecified with withdrawal: F11.93

## 2022-09-12 LAB — COMPREHENSIVE METABOLIC PANEL
ALT: 23 U/L (ref 0–44)
AST: 24 U/L (ref 15–41)
Albumin: 4.5 g/dL (ref 3.5–5.0)
Alkaline Phosphatase: 58 U/L (ref 38–126)
Anion gap: 9 (ref 5–15)
BUN: 13 mg/dL (ref 6–20)
CO2: 27 mmol/L (ref 22–32)
Calcium: 9.6 mg/dL (ref 8.9–10.3)
Chloride: 102 mmol/L (ref 98–111)
Creatinine, Ser: 0.57 mg/dL (ref 0.44–1.00)
GFR, Estimated: >60 mL/min (ref >60)
Glucose, Bld: 93 mg/dL (ref 70–99)
Potassium: 4.8 mmol/L (ref 3.5–5.1)
Sodium: 138 mmol/L (ref 135–145)
Total Bilirubin: 0.5 mg/dL (ref 0.3–1.2)
Total Protein: 7.6 g/dL (ref 6.5–8.1)

## 2022-09-12 LAB — RAPID URINE DRUG SCREEN, HOSP PERFORMED
Amphetamines: NOT DETECTED
Barbiturates: NOT DETECTED
Benzodiazepines: POSITIVE — AB
Cocaine: POSITIVE — AB
Opiates: POSITIVE — AB
Tetrahydrocannabinol: NOT DETECTED

## 2022-09-12 LAB — URINALYSIS, W/ REFLEX TO CULTURE (INFECTION SUSPECTED)
Bacteria, UA: NONE SEEN
Bilirubin Urine: NEGATIVE
Glucose, UA: NEGATIVE mg/dL
Hgb urine dipstick: NEGATIVE
Ketones, ur: NEGATIVE mg/dL
Leukocytes,Ua: NEGATIVE
Nitrite: NEGATIVE
Protein, ur: NEGATIVE mg/dL
Specific Gravity, Urine: 1.023 (ref 1.005–1.030)
pH: 6 (ref 5.0–8.0)

## 2022-09-12 LAB — CBC
HCT: 42.4 % (ref 36.0–46.0)
Hemoglobin: 13.1 g/dL (ref 12.0–15.0)
MCH: 25.9 pg — ABNORMAL LOW (ref 26.0–34.0)
MCHC: 30.9 g/dL (ref 30.0–36.0)
MCV: 83.8 fL (ref 80.0–100.0)
Platelets: 211 10*3/uL (ref 150–400)
RBC: 5.06 MIL/uL (ref 3.87–5.11)
RDW: 14.1 % (ref 11.5–15.5)
WBC: 3.3 10*3/uL — ABNORMAL LOW (ref 4.0–10.5)
nRBC: 0 % (ref 0.0–0.2)

## 2022-09-12 LAB — ETHANOL: Alcohol, Ethyl (B): <10 mg/dL (ref <10)

## 2022-09-12 LAB — HCG, SERUM, QUALITATIVE: Preg, Serum: NEGATIVE

## 2022-09-12 MED ORDER — DICYCLOMINE HCL 20 MG PO TABS: 20.0000 mg | ORAL_TABLET | Freq: Four times a day (QID) | ORAL | Status: DC | PRN

## 2022-09-12 MED ORDER — METHOCARBAMOL 500 MG PO TABS: 500.0000 mg | ORAL_TABLET | Freq: Three times a day (TID) | ORAL | 0 refills | Status: DC | PRN

## 2022-09-12 MED ORDER — NAPROXEN 375 MG PO TABS: 375.0000 mg | ORAL_TABLET | Freq: Two times a day (BID) | ORAL | 0 refills | Status: DC

## 2022-09-12 MED ORDER — ONDANSETRON 4 MG PO TBDP: 4.0000 mg | ORAL_TABLET | Freq: Three times a day (TID) | ORAL | 0 refills | Status: DC | PRN

## 2022-09-12 MED ORDER — ONDANSETRON 4 MG PO TBDP
4.0000 mg | ORAL_TABLET | Freq: Four times a day (QID) | ORAL | Status: DC | PRN
  Administered 2022-09-12: 4 mg via ORAL
  Filled 2022-09-12: qty 1

## 2022-09-12 MED ORDER — DICYCLOMINE HCL 20 MG PO TABS: 20.0000 mg | ORAL_TABLET | Freq: Three times a day (TID) | ORAL | 0 refills | Status: DC | PRN

## 2022-09-12 MED ORDER — HYDROXYZINE HCL 25 MG PO TABS
25.0000 mg | ORAL_TABLET | Freq: Four times a day (QID) | ORAL | Status: DC | PRN
  Administered 2022-09-12: 25 mg via ORAL
  Filled 2022-09-12: qty 1

## 2022-09-12 MED ORDER — NAPROXEN 500 MG PO TABS
500.0000 mg | ORAL_TABLET | Freq: Two times a day (BID) | ORAL | Status: DC | PRN
  Administered 2022-09-12: 500 mg via ORAL
  Filled 2022-09-12: qty 1

## 2022-09-12 MED ORDER — METHOCARBAMOL 500 MG PO TABS
500.0000 mg | ORAL_TABLET | Freq: Three times a day (TID) | ORAL | Status: DC | PRN
  Administered 2022-09-12: 500 mg via ORAL
  Filled 2022-09-12: qty 1

## 2022-09-12 MED ORDER — LOPERAMIDE HCL 2 MG PO CAPS: 2.0000 mg | ORAL_CAPSULE | Freq: Four times a day (QID) | ORAL | 0 refills | Status: DC | PRN

## 2022-09-12 MED ORDER — HYDROXYZINE HCL 25 MG PO TABS: 25.0000 mg | ORAL_TABLET | Freq: Four times a day (QID) | ORAL | 0 refills | Status: DC

## 2022-09-12 MED ORDER — LOPERAMIDE HCL 2 MG PO CAPS
2.0000 mg | ORAL_CAPSULE | ORAL | Status: DC | PRN
  Administered 2022-09-12: 2 mg via ORAL
  Filled 2022-09-12: qty 1

## 2022-09-12 NOTE — ED Provider Notes (Incomplete)
Care assumed from Dr. Criss Alvine, patient desiring detox, urinalysis is pending. She will need to be discharged with outpatient resources.

## 2022-09-12 NOTE — ED Provider Notes (Signed)
[facility] EMERGENCY DEPARTMENT AT [facility] Provider Note   CSN: [id number] Arrival date & time: 09/12/22  1814     History  Chief Complaint  Patient presents with   Withdrawal    [name] is a 27 y.o. female.  HPI 27 year old female presents requesting heroin detox.  Last used heroin at around 5 PM prior to getting to the ER.  She uses heroin multiple times a day.  She has been doing this for several months.  She wants to stop and went to [facility] and they told her they do not do detox and told her she should go to the ER.  She states that since she has not used in the last several hours she has developed chills, nausea and vomiting, diarrhea.  She feels shaky in her legs.  These are all symptoms she is gone through with withdrawal before.  She denies any thoughts of suicide.  She also tells me that for the last couple days she has had left-sided back pain near her kidney.  It is a sharp pain worst in the morning and is constant throughout the day.  Does not worsen with movement.  No abdominal pain, urinary symptoms.  No weakness or numbness in her extremities or incontinence.  Home Medications Prior to Admission medications   Medication Sig Start Date End Date Taking? Authorizing Provider  dicyclomine (BENTYL) 20 MG tablet Take 1 tablet (20 mg total) by mouth 3 (three) times daily as needed for spasms (abdominal spasm). 09/12/22  Yes Pricilla Loveless, MD  hydrOXYzine (ATARAX) 25 MG tablet Take 1 tablet (25 mg total) by mouth every 6 (six) hours. 09/12/22  Yes Pricilla Loveless, MD  loperamide (IMODIUM) 2 MG capsule Take 1 capsule (2 mg total) by mouth 4 (four) times daily as needed for diarrhea or loose stools. 09/12/22  Yes Pricilla Loveless, MD  methocarbamol (ROBAXIN) 500 MG tablet Take 1 tablet (500 mg total) by mouth every 8 (eight) hours as needed for muscle spasms. 09/12/22  Yes Pricilla Loveless, MD  naproxen (NAPROSYN) 375 MG tablet Take 1 tablet (375 mg total) by mouth 2  (two) times daily. 09/12/22  Yes Pricilla Loveless, MD  ondansetron (ZOFRAN-ODT) 4 MG disintegrating tablet Take 1 tablet (4 mg total) by mouth every 8 (eight) hours as needed for nausea or vomiting. 09/12/22  Yes Pricilla Loveless, MD  bismuth subsalicylate (PEPTO BISMOL) 262 MG/15ML suspension Take 30 mLs by mouth every 6 (six) hours as needed for indigestion.    [provider]  Buprenorphine HCl-Naloxone HCl 8-2 MG FILM Place 1 Film under the tongue See admin instructions. Place 1 film under the tongue in the morning, at lunchtime, and in the evening 03/20/22   [provider]  gabapentin (NEURONTIN) 600 MG tablet Take 600 mg by mouth every 12 (twelve) hours. 03/18/22   [provider]  hydrocortisone (ANUSOL-HC) 2.5 % rectal cream Apply 1 Application topically 4 (four) times daily as needed for hemorrhoids. 04/17/22   Alwyn Ren, MD  ibuprofen (ADVIL) 800 MG tablet Take 800 mg by mouth every 8 (eight) hours as needed (for pain). 12/26/21   [provider]  loratadine (CLARITIN) 10 MG tablet Take 1 tablet (10 mg total) by mouth daily as needed for allergies. 04/17/22   Alwyn Ren, MD  nicotine (NICODERM CQ - DOSED IN MG/24 HOURS) 14 mg/24hr patch Place 1 patch (14 mg total) onto the skin daily. 04/18/22   Alwyn Ren, MD  polyvinyl  alcohol (LIQUIFILM TEARS) 1.4 % ophthalmic solution Place 1 drop into both eyes as needed for dry eyes. 04/17/22   Alwyn Ren, MD  TYLENOL 500 MG tablet Take 1,000 mg by mouth every 6 (six) hours as needed (for pain).    [provider]      Allergies    Blueberry fruit extract, Contrast media [iodinated contrast media], Codeine, Latex, and Omnipaque [iohexol]    Review of Systems   Review of Systems  Gastrointestinal:  Positive for diarrhea and vomiting. Negative for abdominal pain.  Genitourinary:  Negative for dysuria.       No incontinence.  Musculoskeletal:  Positive for back pain.   Neurological:  Negative for weakness and numbness.    Physical Exam Updated Vital Signs BP 119/74   Pulse 77   Temp 98 F (36.7 C) (Oral)   Resp 16   SpO2 100%  Physical Exam Vitals and nursing note reviewed.  Constitutional:      Appearance: She is well-developed.  HENT:     Head: Normocephalic and atraumatic.  Cardiovascular:     Rate and Rhythm: Normal rate and regular rhythm.     Heart sounds: Normal heart sounds.  Pulmonary:     Effort: Pulmonary effort is normal.     Breath sounds: Normal breath sounds.  Abdominal:     Palpations: Abdomen is soft.     Tenderness: There is no abdominal tenderness. There is left CVA tenderness (no rash).  Musculoskeletal:     Thoracic back: Tenderness present. No bony tenderness.     Lumbar back: Tenderness present. No bony tenderness.       Back:  Skin:    General: Skin is warm and dry.  Neurological:     Mental Status: She is alert.     Comments: 5/5 strength in BLE. Grossly normal sensation     ED Results / Procedures / Treatments   Labs (all labs ordered are listed, but only abnormal results are displayed) Labs Reviewed  CBC - Abnormal; Notable for the following components:      Result Value   WBC 3.3 (*)    MCH 25.9 (*)    All other components within normal limits  RAPID URINE DRUG SCREEN, HOSP PERFORMED - Abnormal; Notable for the following components:   Opiates POSITIVE (*)    Cocaine POSITIVE (*)    Benzodiazepines POSITIVE (*)    All other components within normal limits  COMPREHENSIVE METABOLIC PANEL  ETHANOL  HCG, SERUM, QUALITATIVE  URINALYSIS, W/ REFLEX TO CULTURE (INFECTION SUSPECTED)    EKG None  Radiology CT ABDOMEN PELVIS WO CONTRAST  Result Date: 09/12/2022 CLINICAL DATA:  Heroin withdrawal with fatigue, sweating, leg restlessness, nausea, and vomiting. EXAM: CT ABDOMEN AND PELVIS WITHOUT CONTRAST TECHNIQUE: Multidetector CT imaging of the abdomen and pelvis was performed following the standard  protocol without IV contrast. RADIATION DOSE REDUCTION: This exam was performed according to the departmental dose-optimization program which includes automated exposure control, adjustment of the mA and/or kV according to patient size and/or use of iterative reconstruction technique. COMPARISON:  01/21/2021. FINDINGS: Lower chest: No acute abnormality. Hepatobiliary: No focal liver abnormality is seen. Status post cholecystectomy. No biliary dilatation. Pancreas: Unremarkable. No pancreatic ductal dilatation or surrounding inflammatory changes. Spleen: Normal in size without focal abnormality. Adrenals/Urinary Tract: The adrenal glands are within normal limits. No renal calculus or hydronephrosis. The bladder is decompressed. Stomach/Bowel: Stomach is within normal limits. Appendix appears normal. No evidence of bowel wall thickening, distention,  or inflammatory changes. No free air or pneumatosis. A moderate amount of retained stool is present in the colon. Vascular/Lymphatic: No significant vascular findings are present. No enlarged abdominal or pelvic lymph nodes. Reproductive: Uterus and bilateral adnexa are unremarkable. Other: No abdominopelvic ascites. Musculoskeletal: No acute osseous abnormality. IMPRESSION: 1. No acute intra-abdominal process. 2. Moderate amount of retained stool in the colon suggesting constipation. Electronically Signed   By: Thornell Sartorius M.D.   On: 09/12/2022 23:04    Procedures Procedures    Medications Ordered in ED Medications  dicyclomine (BENTYL) tablet 20 mg (has no administration in time range)  hydrOXYzine (ATARAX) tablet 25 mg (has no administration in time range)  loperamide (IMODIUM) capsule 2-4 mg (has no administration in time range)  methocarbamol (ROBAXIN) tablet 500 mg (has no administration in time range)  naproxen (NAPROSYN) tablet 500 mg (has no administration in time range)  ondansetron (ZOFRAN-ODT) disintegrating tablet 4 mg (has no administration in  time range)    ED Course/ Medical Decision Making/ A&P                                 Medical Decision Making Amount and/or Complexity of Data Reviewed Labs: ordered.    Details: Mild leukopenia Radiology: ordered and independent interpretation performed.    Details: No ureteral stone/hydronephrosis.   Risk Prescription drug management.   Patient's back pain is probably muscular. No midline/bony tenderness. No red flags besides drug abuse to suggest spinal cord emergency. Pain is much more lateral.  Scan is unremarkable.  No urine symptoms.  She has a prescription of buprenorphine but has not used it because it seems to cause withdrawal symptoms.  I discussed she needs to wait until she is in a little more withdrawal so that it will actually help as opposed to cause withdrawal.  Otherwise she will need outpatient detox as we do not offer this at this facility.  She was given some medications to help with symptoms of her withdrawal.  Otherwise she denies any suicidal thoughts.  Stable for discharge home with return precautions and outpatient detox.       Final Clinical Impression(s) / ED Diagnoses Final diagnoses:  Opiate withdrawal (HCC)    Rx / DC Orders ED Discharge Orders          Ordered    dicyclomine (BENTYL) 20 MG tablet  3 times daily PRN        09/12/22 2327    hydrOXYzine (ATARAX) 25 MG tablet  Every 6 hours        09/12/22 2327    loperamide (IMODIUM) 2 MG capsule  4 times daily PRN        09/12/22 2327    methocarbamol (ROBAXIN) 500 MG tablet  Every 8 hours PRN        09/12/22 2327    naproxen (NAPROSYN) 375 MG tablet  2 times daily        09/12/22 2327    ondansetron (ZOFRAN-ODT) 4 MG disintegrating tablet  Every 8 hours PRN        09/12/22 2327              Pricilla Loveless, MD 09/12/22 2353

## 2022-09-12 NOTE — Discharge Instructions (Addendum)
You are being given some resources to help with outpatient detox for your opiate use disorder.  You are also being prescribed some medicines to help with withdrawal symptoms.  When you feel like your withdrawal is moderate to severe, then you should start taking the Suboxone that was previously prescribed to you.  Otherwise, if you do not improve or you feel like you are developing any other new/concerning symptoms then return to the ER or call 911.

## 2022-09-12 NOTE — ED Triage Notes (Signed)
Patient presents due to heroin withdrawal. The last time she took it was 2 hours ago. She reports fatigue, sweating, leg restlessness, nausea, vomiting and insomnia.

## 2022-09-13 NOTE — ED Notes (Signed)
Discharge and follow up instructions reviewed with pt. Pt verbalized understanding.  

## 2022-09-16 ENCOUNTER — Other Ambulatory Visit: Payer: Self-pay

## 2022-09-16 ENCOUNTER — Emergency Department (HOSPITAL_COMMUNITY)
Admission: EM | Admit: 2022-09-16 | Discharge: 2022-09-17 | Discharge status: Against Medical Advice | Payer: Medicaid Other | Attending: Physician Assistant | Admitting: Physician Assistant

## 2022-09-16 ENCOUNTER — Encounter (HOSPITAL_COMMUNITY): Payer: Self-pay | Admitting: Emergency Medicine

## 2022-09-16 DIAGNOSIS — R1084 Generalized abdominal pain: Secondary | ICD-10-CM | POA: Diagnosis not present

## 2022-09-16 DIAGNOSIS — Z5321 Procedure and treatment not carried out due to patient leaving prior to being seen by health care provider: Secondary | ICD-10-CM | POA: Diagnosis not present

## 2022-09-16 DIAGNOSIS — M791 Myalgia, unspecified site: Secondary | ICD-10-CM | POA: Insufficient documentation

## 2022-09-16 DIAGNOSIS — F1123 Opioid dependence with withdrawal: Secondary | ICD-10-CM | POA: Insufficient documentation

## 2022-09-16 LAB — CBC WITH DIFFERENTIAL/PLATELET
Abs Immature Granulocytes: 0.01 10*3/uL (ref 0.00–0.07)
Basophils Absolute: 0 10*3/uL (ref 0.0–0.1)
Basophils Relative: 1 %
Eosinophils Absolute: 0.1 10*3/uL (ref 0.0–0.5)
Eosinophils Relative: 2 %
HCT: 39.3 % (ref 36.0–46.0)
Hemoglobin: 12 g/dL (ref 12.0–15.0)
Immature Granulocytes: 0 %
Lymphocytes Relative: 44 %
Lymphs Abs: 2.1 10*3/uL (ref 0.7–4.0)
MCH: 25.2 pg — ABNORMAL LOW (ref 26.0–34.0)
MCHC: 30.5 g/dL (ref 30.0–36.0)
MCV: 82.6 fL (ref 80.0–100.0)
Monocytes Absolute: 0.3 10*3/uL (ref 0.1–1.0)
Monocytes Relative: 6 %
Neutro Abs: 2.2 10*3/uL (ref 1.7–7.7)
Neutrophils Relative %: 47 %
Platelets: 222 10*3/uL (ref 150–400)
RBC: 4.76 MIL/uL (ref 3.87–5.11)
RDW: 14.3 % (ref 11.5–15.5)
WBC: 4.8 10*3/uL (ref 4.0–10.5)
nRBC: 0 % (ref 0.0–0.2)

## 2022-09-16 LAB — COMPREHENSIVE METABOLIC PANEL
ALT: 15 U/L (ref 0–44)
AST: 15 U/L (ref 15–41)
Albumin: 4.1 g/dL (ref 3.5–5.0)
Alkaline Phosphatase: 43 U/L (ref 38–126)
Anion gap: 9 (ref 5–15)
BUN: 25 mg/dL — ABNORMAL HIGH (ref 6–20)
CO2: 29 mmol/L (ref 22–32)
Calcium: 9 mg/dL (ref 8.9–10.3)
Chloride: 102 mmol/L (ref 98–111)
Creatinine, Ser: 0.9 mg/dL (ref 0.44–1.00)
GFR, Estimated: >60 mL/min (ref >60)
Glucose, Bld: 115 mg/dL — ABNORMAL HIGH (ref 70–99)
Potassium: 3.4 mmol/L — ABNORMAL LOW (ref 3.5–5.1)
Sodium: 140 mmol/L (ref 135–145)
Total Bilirubin: 0.4 mg/dL (ref 0.3–1.2)
Total Protein: 6.4 g/dL — ABNORMAL LOW (ref 6.5–8.1)

## 2022-09-16 LAB — RAPID URINE DRUG SCREEN, HOSP PERFORMED
Amphetamines: NOT DETECTED
Barbiturates: NOT DETECTED
Benzodiazepines: POSITIVE — AB
Cocaine: POSITIVE — AB
Opiates: POSITIVE — AB
Tetrahydrocannabinol: NOT DETECTED

## 2022-09-16 LAB — HCG, SERUM, QUALITATIVE: Preg, Serum: NEGATIVE

## 2022-09-16 NOTE — ED Provider Triage Note (Signed)
Emergency Medicine Provider Triage Evaluation Note  [name] , a 27 y.o. female  was evaluated in triage.  Pt complains of heroin withdrawals which began this morning, she last snorted heroin this morning.  She reports she is trying to quit, however there is a prior visit for the same.  She is on Suboxone, reports if she takes this too soon and makes her withdrawals worse.  She is endorsing generalized abdominal pain.  Generalized body aches.  Blood pressure somewhat soft in triage.  Review of Systems  Positive: Myalgias Negative: Vomiting, chest pain, shortness of breath  Physical Exam  Ht 5\' 6"  (1.676 m)   Wt 93 kg   BMI 33.09 kg/m  Gen:   Awake, no distress   Resp:  Normal effort  MSK:   Moves extremities without difficulty  Other:  On the phone, during her triage interview in no distress.  Medical Decision Making  Medically screening exam initiated at 8:52 PM.  Appropriate orders placed.  [name] was informed that the remainder of the evaluation will be completed by another provider, this initial triage assessment does not replace that evaluation, and the importance of remaining in the ED until their evaluation is complete.     Claude Manges, PA-C 09/16/22 2055

## 2022-09-16 NOTE — ED Triage Notes (Signed)
Patient reports withdrawals from heroin/fentanyl use. Last used this AM can't recall what time. Reports tremors, nausea, vomiting, restlessness.

## 2022-09-17 NOTE — ED Notes (Signed)
Pt called x2 for vitals and triage with no answer.

## 2022-10-07 ENCOUNTER — Ambulatory Visit (HOSPITAL_COMMUNITY)
Admission: EM | Admit: 2022-10-07 | Discharge: 2022-10-07 | Discharge status: Against Medical Advice | Payer: Medicaid Other

## 2022-10-07 NOTE — Progress Notes (Signed)
   10/07/22 2212  [facility] Triage Screening (Walk-ins at [facility] only)  How Did You Hear About [place]? Family/Friend  What Is the Reason for Your Visit/Call Today? Pt presents to [facility] voluntarily, accompanied by her sister requesting substance abuse treatment/detox. Pt is experiencing symptoms of withdrawals such as cold chills, body aches, nausea and diarrhea. 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 Pt reports history of anxiety and is prescribed Gabapentin. Pt denies SI,HI, and AVH.  How Long Has This Been Causing You Problems? 1 wk - 1 month  Have You Recently Had Any Thoughts About Hurting Yourself? No  Are You Planning to Commit Suicide/Harm Yourself At This time? No  Have you Recently Had Thoughts About Hurting Someone Karolee Ohs? No  Are You Planning To Harm Someone At This Time? No  Are you currently experiencing any auditory, visual or other hallucinations? No  Have You Used Any Alcohol or Drugs in the Past 24 Hours? Yes  How long ago did you use Drugs or Alcohol? a few hours ago  What Did You Use and How Much? heroine (unknown quantity)  Do you have any current medical co-morbidities that require immediate attention? No  Clinician description of patient physical appearance/behavior: calm, cooperative, appears sleepy-like at times  What Do You Feel Would Help You the Most Today? Alcohol or Drug Use Treatment  If access to [facility] was not available, would you have sought care in the Emergency Department? Yes  Determination of Need Urgent (48 hours)  Options For Referral Other: Comment;Chemical Dependency Intensive Outpatient Therapy (CDIOP);Facility-Based Crisis

## 2022-10-08 ENCOUNTER — Other Ambulatory Visit (HOSPITAL_COMMUNITY)
Admission: EM | Admit: 2022-10-08 | Discharge: 2022-10-10 | Disposition: A | Discharge status: Home / Self Care | Payer: Medicaid Other | Attending: Psychiatry | Admitting: Psychiatry

## 2022-10-08 ENCOUNTER — Ambulatory Visit (HOSPITAL_COMMUNITY)
Admission: EM | Admit: 2022-10-08 | Discharge: 2022-10-08 | Disposition: A | Discharge status: Home / Self Care | Payer: Medicaid Other

## 2022-10-08 DIAGNOSIS — F17210 Nicotine dependence, cigarettes, uncomplicated: Secondary | ICD-10-CM | POA: Insufficient documentation

## 2022-10-08 DIAGNOSIS — F1911 Other psychoactive substance abuse, in remission: Secondary | ICD-10-CM | POA: Diagnosis not present

## 2022-10-08 DIAGNOSIS — I498 Other specified cardiac arrhythmias: Secondary | ICD-10-CM | POA: Insufficient documentation

## 2022-10-08 DIAGNOSIS — F1910 Other psychoactive substance abuse, uncomplicated: Secondary | ICD-10-CM

## 2022-10-08 DIAGNOSIS — F1123 Opioid dependence with withdrawal: Secondary | ICD-10-CM | POA: Insufficient documentation

## 2022-10-08 DIAGNOSIS — F329 Major depressive disorder, single episode, unspecified: Secondary | ICD-10-CM | POA: Diagnosis not present

## 2022-10-08 DIAGNOSIS — F419 Anxiety disorder, unspecified: Secondary | ICD-10-CM | POA: Diagnosis not present

## 2022-10-08 DIAGNOSIS — R4589 Other symptoms and signs involving emotional state: Secondary | ICD-10-CM

## 2022-10-08 DIAGNOSIS — F1193 Opioid use, unspecified with withdrawal: Secondary | ICD-10-CM

## 2022-10-08 MED ORDER — ACETAMINOPHEN 325 MG PO TABS: 650.0000 mg | ORAL_TABLET | Freq: Four times a day (QID) | ORAL | Status: DC | PRN

## 2022-10-08 MED ORDER — OLANZAPINE 10 MG PO TBDP: 10.0000 mg | ORAL_TABLET | Freq: Three times a day (TID) | ORAL | Status: DC | PRN

## 2022-10-08 MED ORDER — HYDROXYZINE HCL 25 MG PO TABS
25.0000 mg | ORAL_TABLET | Freq: Four times a day (QID) | ORAL | Status: DC | PRN
  Administered 2022-10-09 – 2022-10-10 (×3): 25 mg via ORAL
  Filled 2022-10-08 (×3): qty 1

## 2022-10-08 MED ORDER — ZIPRASIDONE MESYLATE 20 MG IM SOLR: 20.0000 mg | INTRAMUSCULAR | Status: DC | PRN

## 2022-10-08 MED ORDER — LOPERAMIDE HCL 2 MG PO CAPS: 2.0000 mg | ORAL_CAPSULE | ORAL | Status: DC | PRN

## 2022-10-08 MED ORDER — ALUM & MAG HYDROXIDE-SIMETH 200-200-20 MG/5ML PO SUSP: 30.0000 mL | ORAL | Status: DC | PRN

## 2022-10-08 MED ORDER — LORAZEPAM 1 MG PO TABS: 1.0000 mg | ORAL_TABLET | ORAL | Status: DC | PRN

## 2022-10-08 MED ORDER — CLONIDINE HCL 0.1 MG PO TABS: 0.1000 mg | ORAL_TABLET | ORAL | Status: DC

## 2022-10-08 MED ORDER — ONDANSETRON 4 MG PO TBDP: 4.0000 mg | ORAL_TABLET | Freq: Four times a day (QID) | ORAL | Status: DC | PRN

## 2022-10-08 MED ORDER — MAGNESIUM HYDROXIDE 400 MG/5ML PO SUSP: 30.0000 mL | Freq: Every day | ORAL | Status: DC | PRN

## 2022-10-08 MED ORDER — CLONIDINE HCL 0.1 MG PO TABS
0.1000 mg | ORAL_TABLET | Freq: Four times a day (QID) | ORAL | Status: DC
  Administered 2022-10-09: 0.1 mg via ORAL
  Filled 2022-10-08: qty 1

## 2022-10-08 MED ORDER — METHOCARBAMOL 500 MG PO TABS
500.0000 mg | ORAL_TABLET | Freq: Three times a day (TID) | ORAL | Status: DC | PRN
  Administered 2022-10-09 – 2022-10-10 (×3): 500 mg via ORAL
  Filled 2022-10-08 (×4): qty 1

## 2022-10-08 MED ORDER — CLONIDINE HCL 0.1 MG PO TABS: 0.1000 mg | ORAL_TABLET | Freq: Every day | ORAL | Status: DC

## 2022-10-08 MED ORDER — NAPROXEN 500 MG PO TABS
500.0000 mg | ORAL_TABLET | Freq: Two times a day (BID) | ORAL | Status: DC | PRN
  Administered 2022-10-10: 500 mg via ORAL
  Filled 2022-10-08: qty 1

## 2022-10-08 MED ORDER — DICYCLOMINE HCL 20 MG PO TABS
20.0000 mg | ORAL_TABLET | Freq: Four times a day (QID) | ORAL | Status: DC | PRN
  Administered 2022-10-09 – 2022-10-10 (×3): 20 mg via ORAL
  Filled 2022-10-08 (×3): qty 1

## 2022-10-08 NOTE — ED Provider Notes (Signed)
Facility Based Crisis Admission H&P  Date: 10/09/22 Patient Name: [name] MRN: [id number] Chief Complaint: increase,  nausea, yawing from heroin   Diagnoses:  Final diagnoses:  Polysubstance abuse (HCC)  Opioid withdrawal (HCC)  Anxious appearance    HPI: [name], 27 y/o female with a history of polysubstance abuse, OD,  opoid used disorder,  presented to [facility].  Per the patient she is seeking rehab from heroin abuse and opioid abuse.  Patient was here yesterday but left AMA.  According to patient she has been using heroin for the past couple years and according to her she used every day the last time was 2 hours before coming in.  According to patient she lives alone and works at [employer].  According to patient she is currently not seeing a psychiatrist or therapist.  According to patient she is currently not taking any prescribed medication.  Per the patient she was diagnosed with depression and anxiety in her teenage years and was taking medicine but she stopped taking them years ago.  According to patient she was also prescribed Suboxone but the last time she took Suboxone was in June 2024 and gabapentin.  Patient reports that she has been having withdrawal symptoms of yelling in, cold chills, restlessness, and nausea.  Face-to-face observation of patient, patient is alert and oriented x 4, speech is clear, maintained minimal eye contact.  Patient appears to be very anxious very consistent restricted, affect is flat congruent with mood.  Patient denies SI, HI, AVH or paranoia.  According to patient she use heroin/fentanyl but she is not sure which one it is because she gets it from off the streets.  Patient denies alcohol use, or any other illicit drug use at this time.  Patient originally came with her sister who is seeking the same treatment.  According to report from staff is that patient was dropped off by her boyfriend or friend and they were outside vomiting prior to  coming in. Patient does not seem to be influenced by external stimuli.  Discussed with patient the need for compliance with his detox treatment.  Patient in agreement.  PHQ9 completed pt scored 15 (moderate-severe depression).  Recommend inpatient [facility]  PHQ 2-9:  Flowsheet Row ED from 10/08/2022 in [facility] Office Visit from 03/12/2021 in [facility] Office Visit from 02/12/2021 in [facility]  Thoughts that you would be better off dead, or of hurting yourself in some way Not at all Not at all Not at all  PHQ-9 Total Score 15 7 6        Flowsheet Row ED from 10/08/2022 in [facility] Most recent reading at 10/09/2022 12:55 AM ED from 10/08/2022 in [facility] Most recent reading at 10/08/2022  9:38 PM ED from 10/07/2022 in [facility] Most recent reading at 10/07/2022 10:20 PM  C-SSRS RISK CATEGORY No Risk No Risk No Risk       Screenings    Flowsheet Row Most Recent Value  COWS Total Score 0       Total Time spent with patient: 30 minutes  Musculoskeletal  Strength & Muscle Tone: within normal limits Gait & Station: normal Patient leans: N/A  Psychiatric Specialty Exam  Presentation General Appearance:  Casual  Eye Contact: Fair  Speech: Clear and Coherent  Speech Volume: Normal  Handedness: Right   Mood and Affect  Mood: Dysphoric; Depressed; Anxious  Affect: Flat   Thought Process  Thought Processes: Coherent  Descriptions of Associations:Intact  Orientation:Full (Time, Place and Person)  Thought Content:WDL    Hallucinations:Hallucinations: None  Ideas of Reference:None  Suicidal Thoughts:Suicidal Thoughts: No  Homicidal Thoughts:Homicidal Thoughts: No   Sensorium  Memory: Immediate Fair  Judgment: Fair  Insight: Fair   Art therapist   Concentration: Fair  Attention Span: Fair  Recall: Fair  Fund of Knowledge: Fair  Language: Fair   Psychomotor Activity  Psychomotor Activity: Psychomotor Activity: Normal   Assets  Assets: Desire for Improvement; Housing; Resilience   Sleep  Sleep: Sleep: Fair Number of Hours of Sleep: 5   Nutritional Assessment (For OBS and FBC admissions only) Has the patient had a weight loss or gain of 10 pounds or more in the last 3 months?: No Has the patient had a decrease in food intake/or appetite?: Yes Does the patient have dental problems?: No Does the patient have eating habits or behaviors that may be indicators of an eating disorder including binging or inducing vomiting?: No Has the patient recently lost weight without trying?: 0 Has the patient been eating poorly because of a decreased appetite?: 0 Malnutrition Screening Tool Score: 0    Physical Exam HENT:     Head: Normocephalic.     Nose: Nose normal.  Eyes:     Pupils: Pupils are equal, round, and reactive to light.  Cardiovascular:     Rate and Rhythm: Normal rate.  Pulmonary:     Effort: Pulmonary effort is normal.  Musculoskeletal:        General: Normal range of motion.     Cervical back: Normal range of motion.  Neurological:     General: No focal deficit present.     Mental Status: She is alert.  Psychiatric:        Mood and Affect: Mood normal.        Behavior: Behavior normal.        Thought Content: Thought content normal.        Judgment: Judgment normal.    Review of Systems  Constitutional: Negative.   HENT: Negative.    Eyes: Negative.   Respiratory: Negative.    Cardiovascular: Negative.   Gastrointestinal: Negative.   Genitourinary: Negative.   Musculoskeletal: Negative.   Skin: Negative.   Neurological: Negative.   Psychiatric/Behavioral:  Positive for substance abuse. The patient is nervous/anxious.     Blood pressure 120/87, pulse 81, temperature 98.6 F (37 C),  temperature source Oral, resp. rate 20, SpO2 100%. There is no height or weight on file to calculate BMI.  Past Psychiatric History: opoid abuse,  polysubstance abuse, OD    Is the patient at risk to self? No  Has the patient been a risk to self in the past 6 months? No .    Has the patient been a risk to self within the distant past? No   Is the patient a risk to others? No   Has the patient been a risk to others in the past 6 months? No   Has the patient been a risk to others within the distant past? No   Past Medical History: see chart  Family History: unknown  Social History: Heroin abuse,  opoid abuse  Last Labs:  Admission on 10/08/2022  Component Date Value Ref Range Status   WBC 10/09/2022 3.6 (L)  4.0 - 10.5 K/uL Final   RBC 10/09/2022 4.83  3.87 - 5.11 MIL/uL Final   Hemoglobin 10/09/2022  12.4  12.0 - 15.0 g/dL Final   HCT 08/65/7846 39.2  36.0 - 46.0 % Final   MCV 10/09/2022 81.2  80.0 - 100.0 fL Final   MCH 10/09/2022 25.7 (L)  26.0 - 34.0 pg Final   MCHC 10/09/2022 31.6  30.0 - 36.0 g/dL Final   RDW 96/29/5284 14.3  11.5 - 15.5 % Final   Platelets 10/09/2022 190  150 - 400 K/uL Final   nRBC 10/09/2022 0.0  0.0 - 0.2 % Final   Neutrophils Relative % 10/09/2022 43  % Final   Neutro Abs 10/09/2022 1.5 (L)  1.7 - 7.7 K/uL Final   Lymphocytes Relative 10/09/2022 45  % Final   Lymphs Abs 10/09/2022 1.6  0.7 - 4.0 K/uL Final   Monocytes Relative 10/09/2022 7  % Final   Monocytes Absolute 10/09/2022 0.2  0.1 - 1.0 K/uL Final   Eosinophils Relative 10/09/2022 4  % Final   Eosinophils Absolute 10/09/2022 0.1  0.0 - 0.5 K/uL Final   Basophils Relative 10/09/2022 1  % Final   Basophils Absolute 10/09/2022 0.0  0.0 - 0.1 K/uL Final   Immature Granulocytes 10/09/2022 0  % Final   Abs Immature Granulocytes 10/09/2022 0.00  0.00 - 0.07 K/uL Final   Performed at [facility], [address] [place], [place] [address]   Sodium 10/09/2022 141  135 - 145 mmol/L Final    Potassium 10/09/2022 4.6  3.5 - 5.1 mmol/L Final   Chloride 10/09/2022 101  98 - 111 mmol/L Final   CO2 10/09/2022 27  22 - 32 mmol/L Final   Glucose, Bld 10/09/2022 80  70 - 99 mg/dL Final   Glucose reference range applies only to samples taken after fasting for at least 8 hours.   BUN 10/09/2022 8  6 - 20 mg/dL Final   Creatinine, Ser 10/09/2022 0.70  0.44 - 1.00 mg/dL Final   Calcium 01/29/7251 9.3  8.9 - 10.3 mg/dL Final   Total Protein 66/44/0347 6.0 (L)  6.5 - 8.1 g/dL Final   Albumin 42/59/5638 4.0  3.5 - 5.0 g/dL Final   AST 75/64/3329 16  15 - 41 U/L Final   ALT 10/09/2022 14  0 - 44 U/L Final   Alkaline Phosphatase 10/09/2022 48  38 - 126 U/L Final   Total Bilirubin 10/09/2022 0.8  0.3 - 1.2 mg/dL Final   GFR, Estimated 10/09/2022 >60  >60 mL/min Final   Comment: (NOTE) Calculated using the CKD-EPI Creatinine Equation (2021)    Anion gap 10/09/2022 13  5 - 15 Final   Performed at [facility], [address], [place], [place] [address]   Alcohol, Ethyl (B) 10/09/2022 <10  <10 mg/dL Final   Comment: (NOTE) Lowest detectable limit for serum alcohol is 10 mg/dL.  For medical purposes only. Performed at [facility], [address] [place], [place] [address]    TSH 10/09/2022 0.553  0.350 - 4.500 uIU/mL Final   Comment: Performed by a 3rd Generation assay with a functional sensitivity of <=0.01 uIU/mL. Performed at [facility], [address] [place], [place] [address]   Admission on 09/16/2022, Discharged on 09/17/2022  Component Date Value Ref Range Status   WBC 09/16/2022 4.8  4.0 - 10.5 K/uL Final   RBC 09/16/2022 4.76  3.87 - 5.11 MIL/uL Final   Hemoglobin 09/16/2022 12.0  12.0 - 15.0 g/dL Final   HCT 10/93/2355 39.3  36.0 - 46.0 % Final   MCV  09/16/2022 82.6  80.0 - 100.0 fL Final   MCH 09/16/2022 25.2 (L)  26.0 - 34.0 pg Final   MCHC 09/16/2022 30.5  30.0 - 36.0 g/dL Final   RDW 45/40/9811 14.3  11.5 - 15.5 % Final   Platelets 09/16/2022 222   150 - 400 K/uL Final   nRBC 09/16/2022 0.0  0.0 - 0.2 % Final   Neutrophils Relative % 09/16/2022 47  % Final   Neutro Abs 09/16/2022 2.2  1.7 - 7.7 K/uL Final   Lymphocytes Relative 09/16/2022 44  % Final   Lymphs Abs 09/16/2022 2.1  0.7 - 4.0 K/uL Final   Monocytes Relative 09/16/2022 6  % Final   Monocytes Absolute 09/16/2022 0.3  0.1 - 1.0 K/uL Final   Eosinophils Relative 09/16/2022 2  % Final   Eosinophils Absolute 09/16/2022 0.1  0.0 - 0.5 K/uL Final   Basophils Relative 09/16/2022 1  % Final   Basophils Absolute 09/16/2022 0.0  0.0 - 0.1 K/uL Final   Immature Granulocytes 09/16/2022 0  % Final   Abs Immature Granulocytes 09/16/2022 0.01  0.00 - 0.07 K/uL Final   Performed at [facility], [address], [place], [place] [address]   Sodium 09/16/2022 140  135 - 145 mmol/L Final   Potassium 09/16/2022 3.4 (L)  3.5 - 5.1 mmol/L Final   Chloride 09/16/2022 102  98 - 111 mmol/L Final   CO2 09/16/2022 29  22 - 32 mmol/L Final   Glucose, Bld 09/16/2022 115 (H)  70 - 99 mg/dL Final   Glucose reference range applies only to samples taken after fasting for at least 8 hours.   BUN 09/16/2022 25 (H)  6 - 20 mg/dL Final   Creatinine, Ser 09/16/2022 0.90  0.44 - 1.00 mg/dL Final   Calcium 29/56/2130 9.0  8.9 - 10.3 mg/dL Final   Total Protein 86/57/8469 6.4 (L)  6.5 - 8.1 g/dL Final   Albumin 62/95/2841 4.1  3.5 - 5.0 g/dL Final   AST 32/44/0102 15  15 - 41 U/L Final   ALT 09/16/2022 15  0 - 44 U/L Final   Alkaline Phosphatase 09/16/2022 43  38 - 126 U/L Final   Total Bilirubin 09/16/2022 0.4  0.3 - 1.2 mg/dL Final   GFR, Estimated 09/16/2022 >60  >60 mL/min Final   Comment: (NOTE) Calculated using the CKD-EPI Creatinine Equation (2021)    Anion gap 09/16/2022 9  5 - 15 Final   Performed at [facility], [address] [place], [place] [address]   Opiates 09/16/2022 POSITIVE (A)  NONE DETECTED Final   Cocaine 09/16/2022 POSITIVE (A)  NONE DETECTED Final    Benzodiazepines 09/16/2022 POSITIVE (A)  NONE DETECTED Final   Amphetamines 09/16/2022 NONE DETECTED  NONE DETECTED Final   Tetrahydrocannabinol 09/16/2022 NONE DETECTED  NONE DETECTED Final   Barbiturates 09/16/2022 NONE DETECTED  NONE DETECTED Final   Comment: (NOTE) DRUG SCREEN FOR MEDICAL PURPOSES ONLY.  IF CONFIRMATION IS NEEDED FOR ANY PURPOSE, NOTIFY LAB WITHIN 5 DAYS.  LOWEST DETECTABLE LIMITS FOR URINE DRUG SCREEN Drug Class                     Cutoff (ng/mL) Amphetamine and metabolites    1000 Barbiturate and metabolites    200 Benzodiazepine                 200 Opiates and metabolites        300 Cocaine and metabolites  300 THC                            50 Performed at [facility], [address], [place], [place] [address]    Preg, Serum 09/16/2022 NEGATIVE  NEGATIVE Final   Comment:        THE SENSITIVITY OF THIS METHODOLOGY IS >10 mIU/mL. Performed at [facility], [address], [place], [place] [address]   Admission on 09/12/2022, Discharged on 09/13/2022  Component Date Value Ref Range Status   Sodium 09/12/2022 138  135 - 145 mmol/L Final   Potassium 09/12/2022 4.8  3.5 - 5.1 mmol/L Final   Chloride 09/12/2022 102  98 - 111 mmol/L Final   CO2 09/12/2022 27  22 - 32 mmol/L Final   Glucose, Bld 09/12/2022 93  70 - 99 mg/dL Final   Glucose reference range applies only to samples taken after fasting for at least 8 hours.   BUN 09/12/2022 13  6 - 20 mg/dL Final   Creatinine, Ser 09/12/2022 0.57  0.44 - 1.00 mg/dL Final   Calcium 42/59/5638 9.6  8.9 - 10.3 mg/dL Final   Total Protein 75/64/3329 7.6  6.5 - 8.1 g/dL Final   Albumin 51/88/4166 4.5  3.5 - 5.0 g/dL Final   AST 07/27/1599 24  15 - 41 U/L Final   ALT 09/12/2022 23  0 - 44 U/L Final   Alkaline Phosphatase 09/12/2022 58  38 - 126 U/L Final   Total Bilirubin 09/12/2022 0.5  0.3 - 1.2 mg/dL Final   GFR, Estimated 09/12/2022 >60  >60 mL/min Final   Comment: (NOTE) Calculated using  the CKD-EPI Creatinine Equation (2021)    Anion gap 09/12/2022 9  5 - 15 Final   Performed at [facility], [address], [place], [place] [address]   Alcohol, Ethyl (B) 09/12/2022 <10  <10 mg/dL Final   Comment: (NOTE) Lowest detectable limit for serum alcohol is 10 mg/dL.  For medical purposes only. Performed at [facility], [address], [place], [place] [address]    WBC 09/12/2022 3.3 (L)  4.0 - 10.5 K/uL Final   RBC 09/12/2022 5.06  3.87 - 5.11 MIL/uL Final   Hemoglobin 09/12/2022 13.1  12.0 - 15.0 g/dL Final   HCT 20/25/4270 42.4  36.0 - 46.0 % Final   MCV 09/12/2022 83.8  80.0 - 100.0 fL Final   MCH 09/12/2022 25.9 (L)  26.0 - 34.0 pg Final   MCHC 09/12/2022 30.9  30.0 - 36.0 g/dL Final   RDW 62/37/6283 14.1  11.5 - 15.5 % Final   Platelets 09/12/2022 211  150 - 400 K/uL Final   nRBC 09/12/2022 0.0  0.0 - 0.2 % Final   Performed at [facility], [address] [place], [place] [address]   Opiates 09/12/2022 POSITIVE (A)  NONE DETECTED Final   Cocaine 09/12/2022 POSITIVE (A)  NONE DETECTED Final   Benzodiazepines 09/12/2022 POSITIVE (A)  NONE DETECTED Final   Amphetamines 09/12/2022 NONE DETECTED  NONE DETECTED Final   Tetrahydrocannabinol 09/12/2022 NONE DETECTED  NONE DETECTED Final   Barbiturates 09/12/2022 NONE DETECTED  NONE DETECTED Final   Comment: (NOTE) DRUG SCREEN FOR MEDICAL PURPOSES ONLY.  IF CONFIRMATION IS NEEDED FOR ANY PURPOSE, NOTIFY LAB WITHIN 5 DAYS.  LOWEST DETECTABLE LIMITS FOR URINE DRUG SCREEN Drug Class  Cutoff (ng/mL) Amphetamine and metabolites    1000 Barbiturate and metabolites    200 Benzodiazepine                 200 Opiates and metabolites        300 Cocaine and metabolites        300 THC                            50 Performed at [facility], [address], [place], [place] [address]    Preg, Serum 09/12/2022 NEGATIVE  NEGATIVE  Final   Comment:        THE SENSITIVITY OF THIS METHODOLOGY IS >10 mIU/mL. Performed at [facility], [address] [place], [place] [address]    Specimen Source 09/12/2022 URINE, CLEAN CATCH   Final   Color, Urine 09/12/2022 YELLOW  YELLOW Final   APPearance 09/12/2022 CLEAR  CLEAR Final   Specific Gravity, Urine 09/12/2022 1.023  1.005 - 1.030 Final   pH 09/12/2022 6.0  5.0 - 8.0 Final   Glucose, UA 09/12/2022 NEGATIVE  NEGATIVE mg/dL Final   Hgb urine dipstick 09/12/2022 NEGATIVE  NEGATIVE Final   Bilirubin Urine 09/12/2022 NEGATIVE  NEGATIVE Final   Ketones, ur 09/12/2022 NEGATIVE  NEGATIVE mg/dL Final   Protein, ur 40/34/7425 NEGATIVE  NEGATIVE mg/dL Final   Nitrite 95/63/8756 NEGATIVE  NEGATIVE Final   Leukocytes,Ua 09/12/2022 NEGATIVE  NEGATIVE Final   RBC / HPF 09/12/2022 0-5  0 - 5 RBC/hpf Final   WBC, UA 09/12/2022 0-5  0 - 5 WBC/hpf Final   Comment:        Reflex urine culture not performed if WBC <=10, OR if Squamous epithelial cells >5. If Squamous epithelial cells >5 suggest recollection.    Bacteria, UA 09/12/2022 NONE SEEN  NONE SEEN Final   Squamous Epithelial / HPF 09/12/2022 0-5  0 - 5 /HPF Final   Mucus 09/12/2022 PRESENT   Final   Performed at [facility], [address] [place], [place] [address]  Admission on 04/13/2022, Discharged on 04/17/2022  Component Date Value Ref Range Status   Glucose-Capillary 04/13/2022 92  70 - 99 mg/dL Final   Glucose reference range applies only to samples taken after fasting for at least 8 hours.   Sodium 04/13/2022 137  135 - 145 mmol/L Final   Potassium 04/13/2022 3.8  3.5 - 5.1 mmol/L Final   Chloride 04/13/2022 104  98 - 111 mmol/L Final   CO2 04/13/2022 23  22 - 32 mmol/L Final   Glucose, Bld 04/13/2022 83  70 - 99 mg/dL Final   Glucose reference range applies only to samples taken after fasting for at least 8 hours.   BUN 04/13/2022 18  6 - 20 mg/dL Final   Creatinine, Ser  04/13/2022 1.43 (H)  0.44 - 1.00 mg/dL Final   Calcium 51/88/4166 9.3  8.9 - 10.3 mg/dL Final   Total Protein 07/27/1599 6.6  6.5 - 8.1 g/dL Final   Albumin 09/32/3557 4.1  3.5 - 5.0 g/dL Final   AST 32/20/2542 28  15 - 41 U/L Final   ALT 04/13/2022 20  0 - 44 U/L Final   Alkaline Phosphatase 04/13/2022 51  38 - 126 U/L Final   Total Bilirubin 04/13/2022 0.4  0.3 - 1.2 mg/dL Final   GFR, Estimated 04/13/2022 52 (L)  >60 mL/min Final   Comment: (NOTE) Calculated using the CKD-EPI  Creatinine Equation (2021)    Anion gap 04/13/2022 10  5 - 15 Final   Performed at [facility], [address] [place], [place] [address]   Salicylate Lvl 04/13/2022 <7.0 (L)  7.0 - 30.0 mg/dL Final   Performed at [facility], [address], [place], [place] [address]   Acetaminophen (Tylenol), Serum 04/13/2022 <10 (L)  10 - 30 ug/mL Final   Comment: (NOTE) Therapeutic concentrations vary significantly. A range of 10-30 ug/mL  may be an effective concentration for many patients. However, some  are best treated at concentrations outside of this range. Acetaminophen concentrations >150 ug/mL at 4 hours after ingestion  and >50 ug/mL at 12 hours after ingestion are often associated with  toxic reactions.  Performed at [facility], [address], [place], [place] [address]    Alcohol, Ethyl (B) 04/13/2022 <10  <10 mg/dL Final   Comment: (NOTE) Lowest detectable limit for serum alcohol is 10 mg/dL.  For medical purposes only. Performed at [facility], [address], [place], [place] [address]    Opiates 04/13/2022 POSITIVE (A)  NONE DETECTED Final   Cocaine 04/13/2022 POSITIVE (A)  NONE DETECTED Final   Benzodiazepines 04/13/2022 POSITIVE (A)  NONE DETECTED Final   Amphetamines 04/13/2022 NONE DETECTED  NONE DETECTED Final   Tetrahydrocannabinol 04/13/2022 NONE DETECTED  NONE DETECTED Final   Barbiturates 04/13/2022 NONE  DETECTED  NONE DETECTED Final   Comment: (NOTE) DRUG SCREEN FOR MEDICAL PURPOSES ONLY.  IF CONFIRMATION IS NEEDED FOR ANY PURPOSE, NOTIFY LAB WITHIN 5 DAYS.  LOWEST DETECTABLE LIMITS FOR URINE DRUG SCREEN Drug Class                     Cutoff (ng/mL) Amphetamine and metabolites    1000 Barbiturate and metabolites    200 Benzodiazepine                 200 Opiates and metabolites        300 Cocaine and metabolites        300 THC                            50 Performed at [facility], [address], [place], [place] [address]    WBC 04/13/2022 5.0  4.0 - 10.5 K/uL Final   RBC 04/13/2022 4.46  3.87 - 5.11 MIL/uL Final   Hemoglobin 04/13/2022 11.6 (L)  12.0 - 15.0 g/dL Final   HCT 41/32/4401 36.5  36.0 - 46.0 % Final   MCV 04/13/2022 81.8  80.0 - 100.0 fL Final   MCH 04/13/2022 26.0  26.0 - 34.0 pg Final   MCHC 04/13/2022 31.8  30.0 - 36.0 g/dL Final   RDW 02/72/5366 13.6  11.5 - 15.5 % Final   Platelets 04/13/2022 191  150 - 400 K/uL Final   nRBC 04/13/2022 0.0  0.0 - 0.2 % Final   Neutrophils Relative % 04/13/2022 39  % Final   Neutro Abs 04/13/2022 1.9  1.7 - 7.7 K/uL Final   Lymphocytes Relative 04/13/2022 42  % Final   Lymphs Abs 04/13/2022 2.1  0.7 - 4.0 K/uL Final   Monocytes Relative 04/13/2022 10  % Final   Monocytes Absolute 04/13/2022 0.5  0.1 - 1.0 K/uL Final   Eosinophils Relative 04/13/2022 8  % Final   Eosinophils Absolute 04/13/2022 0.4  0.0 - 0.5 K/uL Final  Basophils Relative 04/13/2022 1  % Final   Basophils Absolute 04/13/2022 0.0  0.0 - 0.1 K/uL Final   Immature Granulocytes 04/13/2022 0  % Final   Abs Immature Granulocytes 04/13/2022 0.01  0.00 - 0.07 K/uL Final   Performed at [facility], [address], [place], [place] [address]   I-stat hCG, quantitative 04/13/2022 <5.0  <5 mIU/mL Final   Comment 3 04/13/2022          Final   Comment:   GEST. AGE      CONC.  (mIU/mL)   <=1 WEEK        5 - 50     2 WEEKS        50 - 500     3 WEEKS       100 - 10,000     4 WEEKS     1,000 - 30,000        FEMALE AND NON-PREGNANT FEMALE:     LESS THAN 5 mIU/mL    SARS Coronavirus 2 by RT PCR 04/13/2022 NEGATIVE  NEGATIVE Final   Comment: (NOTE) SARS-CoV-2 target nucleic acids are NOT DETECTED.  The SARS-CoV-2 RNA is generally detectable in upper respiratory specimens during the acute phase of infection. The lowest concentration of SARS-CoV-2 viral copies this assay can detect is 138 copies/mL. A negative result does not preclude SARS-Cov-2 infection and should not be used as the sole basis for treatment or other patient management decisions. A negative result may occur with  improper specimen collection/handling, submission of specimen other than nasopharyngeal swab, presence of viral mutation(s) within the areas targeted by this assay, and inadequate number of viral copies(<138 copies/mL). A negative result must be combined with clinical observations, patient history, and epidemiological information. The expected result is Negative.  Fact Sheet for Patients:  BloggerCourse.com  Fact Sheet for Healthcare Providers:  SeriousBroker.it  This test is no                          t yet approved or cleared by the [place] FDA and  has been authorized for detection and/or diagnosis of SARS-CoV-2 by FDA under an Emergency Use Authorization (EUA). This EUA will remain  in effect (meaning this test can be used) for the duration of the COVID-19 declaration under Section 564(b)(1) of the Act, 21 U.S.C.section 360bbb-3(b)(1), unless the authorization is terminated  or revoked sooner.       Influenza A by PCR 04/13/2022 NEGATIVE  NEGATIVE Final   Influenza B by PCR 04/13/2022 NEGATIVE  NEGATIVE Final   Comment: (NOTE) The Xpert Xpress SARS-CoV-2/FLU/RSV plus assay is intended as an aid in the diagnosis of influenza from Nasopharyngeal swab specimens and should  not be used as a sole basis for treatment. Nasal washings and aspirates are unacceptable for Xpert Xpress SARS-CoV-2/FLU/RSV testing.  Fact Sheet for Patients: BloggerCourse.com  Fact Sheet for Healthcare Providers: SeriousBroker.it  This test is not yet approved or cleared by the [place] FDA and has been authorized for detection and/or diagnosis of SARS-CoV-2 by FDA under an Emergency Use Authorization (EUA). This EUA will remain in effect (meaning this test can be used) for the duration of the COVID-19 declaration under Section 564(b)(1) of the Act, 21 U.S.C. section 360bbb-3(b)(1), unless the authorization is terminated or revoked.     Resp Syncytial Virus by PCR 04/13/2022 NEGATIVE  NEGATIVE Final   Comment: (NOTE) Fact Sheet for Patients: BloggerCourse.com  Fact Sheet  for Healthcare Providers: SeriousBroker.it  This test is not yet approved or cleared by the [place] and has been authorized for detection and/or diagnosis of SARS-CoV-2 by FDA under an Emergency Use Authorization (EUA). This EUA will remain in effect (meaning this test can be used) for the duration of the COVID-19 declaration under Section 564(b)(1) of the Act, 21 U.S.C. section 360bbb-3(b)(1), unless the authorization is terminated or revoked.  Performed at [facility], [address], [place], [place] [address]    FIO2 04/13/2022 100  % Final   Mode 04/13/2022 PRESSURE REGULATED VOLUME CONTROL   Final   MECHVT 04/13/2022 500  mL Final   RATE 04/13/2022 16  resp/min Final   PEEP 04/13/2022 5  cm H20 Final   pH, Arterial 04/13/2022 7.41  7.35 - 7.45 Final   pCO2 arterial 04/13/2022 36  32 - 48 mmHg Final   pO2, Arterial 04/13/2022 489 (H)  83 - 108 mmHg Final   Bicarbonate 04/13/2022 22.8  20.0 - 28.0 mmol/L Final   Acid-base deficit 04/13/2022 1.4  0.0 - 2.0 mmol/L  Final   O2 Saturation 04/13/2022 99.2  % Final   Patient temperature 04/13/2022 37.0   Final   Collection site 04/13/2022 RIGHT RADIAL   Final   Drawn by 04/13/2022 62130   Final   Allens test (pass/fail) 04/13/2022 PASS  PASS Final   Performed at [facility], [address], [place], [place] [address]   HIV Screen 4th Generation wRfx 04/13/2022 Non Reactive  Non Reactive Final   Performed at [facility], [address], [place], [place] [address]   Total CK 04/13/2022 326 (H)  38 - 234 U/L Final   Performed at [facility], [address], [place], [place] [address]   MRSA by PCR Next Gen 04/13/2022 NOT DETECTED  NOT DETECTED Final   Comment: (NOTE) The GeneXpert MRSA Assay (FDA approved for NASAL specimens only), is one component of a comprehensive MRSA colonization surveillance program. It is not intended to diagnose MRSA infection nor to guide or monitor treatment for MRSA infections. Test performance is not FDA approved in patients less than 24 years old. Performed at [facility], [address] [place], [place] [address]    Magnesium 04/13/2022 2.1  1.7 - 2.4 mg/dL Final   Performed at [facility], [address], [place], [place] [address]   Magnesium 04/13/2022 2.1  1.7 - 2.4 mg/dL Final   Performed at [facility], [address], [place], [place] [address]   Phosphorus 04/13/2022 4.8 (H)  2.5 - 4.6 mg/dL Final   Performed at [facility], [address], [place], [place] [address]   Phosphorus 04/13/2022 4.0  2.5 - 4.6 mg/dL Final   Performed at [facility], [address] [place], [place] [address]   Glucose-Capillary 04/13/2022 93  70 - 99 mg/dL Final   Glucose reference range applies only to samples taken after fasting for at least 8 hours.   Glucose-Capillary 04/13/2022 93  70 - 99 mg/dL Final   Glucose reference range applies  only to samples taken after fasting for at least 8 hours.   WBC 04/14/2022 5.7  4.0 - 10.5 K/uL Final   RBC 04/14/2022 4.06  3.87 - 5.11 MIL/uL Final   Hemoglobin 04/14/2022 10.6 (L)  12.0 - 15.0 g/dL Final   HCT 56/38/7564 34.3 (L)  36.0 - 46.0 % Final  MCV 04/14/2022 84.5  80.0 - 100.0 fL Final   MCH 04/14/2022 26.1  26.0 - 34.0 pg Final   MCHC 04/14/2022 30.9  30.0 - 36.0 g/dL Final   RDW 55/73/2202 13.8  11.5 - 15.5 % Final   Platelets 04/14/2022 121 (L)  150 - 400 K/uL Final   nRBC 04/14/2022 0.0  0.0 - 0.2 % Final   Performed at [facility], [address] [place], [place] [address]   Sodium 04/14/2022 137  135 - 145 mmol/L Final   Potassium 04/14/2022 3.8  3.5 - 5.1 mmol/L Final   Chloride 04/14/2022 109  98 - 111 mmol/L Final   CO2 04/14/2022 21 (L)  22 - 32 mmol/L Final   Glucose, Bld 04/14/2022 98  70 - 99 mg/dL Final   Glucose reference range applies only to samples taken after fasting for at least 8 hours.   BUN 04/14/2022 19  6 - 20 mg/dL Final   Creatinine, Ser 04/14/2022 0.77  0.44 - 1.00 mg/dL Final   Calcium 62/37/6283 7.5 (L)  8.9 - 10.3 mg/dL Final   GFR, Estimated 04/14/2022 >60  >60 mL/min Final   Comment: (NOTE) Calculated using the CKD-EPI Creatinine Equation (2021)    Anion gap 04/14/2022 7  5 - 15 Final   Performed at [facility], [address], [place], [place] [address]   Magnesium 04/14/2022 2.1  1.7 - 2.4 mg/dL Final   Performed at [facility], [address] [place], [place] [address]   Phosphorus 04/14/2022 3.0  2.5 - 4.6 mg/dL Final   Performed at [facility], [address], [place], [place] [address]   Triglycerides 04/14/2022 63  <150 mg/dL Final   Performed at [facility], [address], [place], [place] [address]   Glucose-Capillary 04/13/2022 73  70 - 99 mg/dL Final   Glucose reference range applies only to samples taken after fasting for at  least 8 hours.   Comment 1 04/13/2022 Notify RN   Final   Magnesium 04/14/2022 1.9  1.7 - 2.4 mg/dL Final   Performed at [facility], [address], [place], [place] [address]   Phosphorus 04/14/2022 2.5  2.5 - 4.6 mg/dL Final   Performed at [facility], [address] [place], [place] [address]   Glucose-Capillary 04/13/2022 97  70 - 99 mg/dL Final   Glucose reference range applies only to samples taken after fasting for at least 8 hours.   Glucose-Capillary 04/14/2022 82  70 - 99 mg/dL Final   Glucose reference range applies only to samples taken after fasting for at least 8 hours.   Specimen Description 04/14/2022    Final                   Value:TRACHEAL ASPIRATE Performed at [facility], [address], [place], [place] [address]    Special Requests 04/14/2022    Final                   Value:NONE Performed at [facility], [address], [place], [place] [address]    Gram Stain 04/14/2022    Final                   Value:FEW WBC PRESENT,BOTH PMN AND MONONUCLEAR RARE GRAM POSITIVE COCCI IN PAIRS Performed at [facility], [address], [place], [place] [address]  Culture 04/14/2022 MODERATE STREPTOCOCCUS PNEUMONIAE   Final   Report Status 04/14/2022 04/17/2022 FINAL   Final   Organism ID, Bacteria 04/14/2022 STREPTOCOCCUS PNEUMONIAE   Final   Glucose-Capillary 04/14/2022 110 (H)  70 - 99 mg/dL Final   Glucose reference range applies only to samples taken after fasting for at least 8 hours.   Comment 1 04/14/2022 Notify RN   Final   Comment 2 04/14/2022 Document in Chart   Final   Sodium 04/15/2022 136  135 - 145 mmol/L Final   Potassium 04/15/2022 2.8 (L)  3.5 - 5.1 mmol/L Final   Chloride 04/15/2022 109  98 - 111 mmol/L Final   CO2 04/15/2022 20 (L)  22 - 32 mmol/L Final   Glucose, Bld 04/15/2022 180 (H)  70 - 99 mg/dL Final   Glucose reference range applies only to samples taken after  fasting for at least 8 hours.   BUN 04/15/2022 8  6 - 20 mg/dL Final   Creatinine, Ser 04/15/2022 0.63  0.44 - 1.00 mg/dL Final   Calcium 73/22/0254 7.4 (L)  8.9 - 10.3 mg/dL Final   GFR, Estimated 04/15/2022 >60  >60 mL/min Final   Comment: (NOTE) Calculated using the CKD-EPI Creatinine Equation (2021)    Anion gap 04/15/2022 7  5 - 15 Final   Performed at [facility], [address] [place], [place] [address]   Magnesium 04/15/2022 1.9  1.7 - 2.4 mg/dL Final   Performed at [facility], [address], [place], [place] [address]   Phosphorus 04/15/2022 1.2 (L)  2.5 - 4.6 mg/dL Final   Performed at [facility], [address] [place], [place] [address]   Procalcitonin 04/15/2022 <0.10  ng/mL Final   Comment:        Interpretation: PCT (Procalcitonin) <= 0.5 ng/mL: Systemic infection (sepsis) is not likely. Local bacterial infection is possible. (NOTE)       Sepsis PCT Algorithm           Lower Respiratory Tract                                      Infection PCT Algorithm    ----------------------------     ----------------------------         PCT < 0.25 ng/mL                PCT < 0.10 ng/mL          Strongly encourage             Strongly discourage   discontinuation of antibiotics    initiation of antibiotics    ----------------------------     -----------------------------       PCT 0.25 - 0.50 ng/mL            PCT 0.10 - 0.25 ng/mL               OR       >80% decrease in PCT            Discourage initiation of                                            antibiotics      Encourage discontinuation  of antibiotics    ----------------------------     -----------------------------         PCT >= 0.50 ng/mL              PCT 0.26 - 0.50 ng/mL               AND                                 <80% decrease in PCT             Encourage initiation of                                             antibiotics        Encourage continuation           of antibiotics    ----------------------------     -----------------------------        PCT >= 0.50 ng/mL                  PCT > 0.50 ng/mL               AND         increase in PCT                  Strongly encourage                                      initiation of antibiotics    Strongly encourage escalation           of antibiotics                                     -----------------------------                                           PCT <= 0.25 ng/mL                                                 OR                                        > 80% decrease in PCT                                      Discontinue / Do not initiate                                             antibiotics  Performed at [facility], [address] [place], [place] [address]    Total CK 04/15/2022  456 (H)  38 - 234 U/L Final   Performed at [facility], [address] [place], [place] [address]   Sodium 04/16/2022 136  135 - 145 mmol/L Final   Potassium 04/16/2022 3.9  3.5 - 5.1 mmol/L Final   Chloride 04/16/2022 112 (H)  98 - 111 mmol/L Final   CO2 04/16/2022 20 (L)  22 - 32 mmol/L Final   Glucose, Bld 04/16/2022 128 (H)  70 - 99 mg/dL Final   Glucose reference range applies only to samples taken after fasting for at least 8 hours.   BUN 04/16/2022 6  6 - 20 mg/dL Final   Creatinine, Ser 04/16/2022 0.57  0.44 - 1.00 mg/dL Final   Calcium 29/51/8841 8.2 (L)  8.9 - 10.3 mg/dL Final   GFR, Estimated 04/16/2022 >60  >60 mL/min Final   Comment: (NOTE) Calculated using the CKD-EPI Creatinine Equation (2021)    Anion gap 04/16/2022 4 (L)  5 - 15 Final   Performed at [facility], [address] [place], [place] [address]   WBC 04/16/2022 2.6 (L)  4.0 - 10.5 K/uL Final   RBC 04/16/2022 4.11  3.87 - 5.11 MIL/uL Final   Hemoglobin 04/16/2022 10.6 (L)  12.0 - 15.0 g/dL Final   HCT 01/60/1093 34.8 (L)  36.0 -  46.0 % Final   MCV 04/16/2022 84.7  80.0 - 100.0 fL Final   MCH 04/16/2022 25.8 (L)  26.0 - 34.0 pg Final   MCHC 04/16/2022 30.5  30.0 - 36.0 g/dL Final   RDW 23/55/7322 14.0  11.5 - 15.5 % Final   Platelets 04/16/2022 118 (L)  150 - 400 K/uL Final   Comment: CONSISTENT WITH PREVIOUS RESULT REPEATED TO VERIFY    nRBC 04/16/2022 0.0  0.0 - 0.2 % Final   Performed at [facility], [address], [place], [place] [address]   Magnesium 04/16/2022 2.0  1.7 - 2.4 mg/dL Final   Performed at [facility], [address] [place], [place] [address]   Vitamin B-12 04/16/2022 274  180 - 914 pg/mL Final   Comment: (NOTE) This assay is not validated for testing neonatal or myeloproliferative syndrome specimens for Vitamin B12 levels. Performed at [facility], [address], [place], [place] [address]    TSH 04/16/2022 0.343 (L)  0.350 - 4.500 uIU/mL Final   Comment: Performed by a 3rd Generation assay with a functional sensitivity of <=0.01 uIU/mL. Performed at [facility], [address], [place], [place] [address]    Free T4 04/16/2022 0.60 (L)  0.61 - 1.12 ng/dL Final   Comment: (NOTE) Biotin ingestion may interfere with free T4 tests. If the results are inconsistent with the TSH level, previous test results, or the clinical presentation, then consider biotin interference. If needed, order repeat testing after stopping biotin. Performed at [facility], [address] [place], [place] [address]    Phosphorus 04/16/2022 2.6  2.5 - 4.6 mg/dL Final   Performed at [facility], [address], [place], [place] [address]   Total CK 04/16/2022 400 (H)  38 - 234 U/L Final   Performed at [facility], [address], [place], [place] [address]   WBC 04/17/2022 4.6  4.0 - 10.5 K/uL Final   RBC 04/17/2022 3.55 (L)  3.87 - 5.11 MIL/uL Final   Hemoglobin 04/17/2022 9.4 (L)   12.0 - 15.0 g/dL Final   HCT 46/27/0350  29.7 (L)  36.0 - 46.0 % Final   MCV 04/17/2022 83.7  80.0 - 100.0 fL Final   MCH 04/17/2022 26.5  26.0 - 34.0 pg Final   MCHC 04/17/2022 31.6  30.0 - 36.0 g/dL Final   RDW 25/36/6440 13.9  11.5 - 15.5 % Final   Platelets 04/17/2022 213  150 - 400 K/uL Final   nRBC 04/17/2022 0.0  0.0 - 0.2 % Final   Performed at [facility], [address] [place], [place] [address]   Magnesium 04/17/2022 1.8  1.7 - 2.4 mg/dL Final   Performed at [facility], [address] [place], [place] [address]   Sodium 04/17/2022 141  135 - 145 mmol/L Final   Potassium 04/17/2022 3.6  3.5 - 5.1 mmol/L Final   Chloride 04/17/2022 113 (H)  98 - 111 mmol/L Final   CO2 04/17/2022 23  22 - 32 mmol/L Final   Glucose, Bld 04/17/2022 96  70 - 99 mg/dL Final   Glucose reference range applies only to samples taken after fasting for at least 8 hours.   BUN 04/17/2022 16  6 - 20 mg/dL Final   Creatinine, Ser 04/17/2022 0.54  0.44 - 1.00 mg/dL Final   Calcium 87/56/4332 8.2 (L)  8.9 - 10.3 mg/dL Final   Total Protein 95/18/8416 5.7 (L)  6.5 - 8.1 g/dL Final   Albumin 60/63/0160 3.2 (L)  3.5 - 5.0 g/dL Final   AST 10/93/2355 23  15 - 41 U/L Final   ALT 04/17/2022 24  0 - 44 U/L Final   Alkaline Phosphatase 04/17/2022 50  38 - 126 U/L Final   Total Bilirubin 04/17/2022 0.4  0.3 - 1.2 mg/dL Final   GFR, Estimated 04/17/2022 >60  >60 mL/min Final   Comment: (NOTE) Calculated using the CKD-EPI Creatinine Equation (2021)    Anion gap 04/17/2022 5  5 - 15 Final   Performed at [facility], [address], [place], [place] [address]   Phosphorus 04/17/2022 3.0  2.5 - 4.6 mg/dL Final   Performed at [facility], [address], [place], [place] [address]   Total CK 04/17/2022 119  38 - 234 U/L Final   Performed at [facility], [address], [place], [place] [address]    Allergies:  Blueberry fruit extract, Contrast media [iodinated contrast media], Codeine, Latex, and Omnipaque [iohexol]  Medications:  Facility Ordered Medications  Medication   acetaminophen (TYLENOL) tablet 650 mg   alum & mag hydroxide-simeth (MAALOX/MYLANTA) 200-200-20 MG/5ML suspension 30 mL   magnesium hydroxide (MILK OF MAGNESIA) suspension 30 mL   dicyclomine (BENTYL) tablet 20 mg   hydrOXYzine (ATARAX) tablet 25 mg   loperamide (IMODIUM) capsule 2-4 mg   methocarbamol (ROBAXIN) tablet 500 mg   naproxen (NAPROSYN) tablet 500 mg   ondansetron (ZOFRAN-ODT) disintegrating tablet 4 mg   OLANZapine zydis (ZYPREXA) disintegrating tablet 10 mg   And   LORazepam (ATIVAN) tablet 1 mg   And   ziprasidone (GEODON) injection 20 mg   cloNIDine (CATAPRES) tablet 0.1 mg   Followed by   Melene Muller ON 10/11/2022] cloNIDine (CATAPRES) tablet 0.1 mg   Followed by   Melene Muller ON 10/13/2022] cloNIDine (CATAPRES) tablet 0.1 mg   PTA Medications  Medication Sig   Buprenorphine HCl-Naloxone HCl 8-2 MG FILM Place 1 Film under the tongue See admin instructions. Place 1 film under the tongue in the morning, at lunchtime, and in the evening   gabapentin (  NEURONTIN) 600 MG tablet Take 600 mg by mouth every 12 (twelve) hours.   bismuth subsalicylate (PEPTO BISMOL) 262 MG/15ML suspension Take 30 mLs by mouth every 6 (six) hours as needed for indigestion.   ibuprofen (ADVIL) 800 MG tablet Take 800 mg by mouth every 8 (eight) hours as needed (for pain).   TYLENOL 500 MG tablet Take 1,000 mg by mouth every 6 (six) hours as needed (for pain).   nicotine (NICODERM CQ - DOSED IN MG/24 HOURS) 14 mg/24hr patch Place 1 patch (14 mg total) onto the skin daily.   loratadine (CLARITIN) 10 MG tablet Take 1 tablet (10 mg total) by mouth daily as needed for allergies.   hydrocortisone (ANUSOL-HC) 2.5 % rectal cream Apply 1 Application topically 4 (four) times daily as needed for hemorrhoids.   polyvinyl alcohol (LIQUIFILM TEARS) 1.4 %  ophthalmic solution Place 1 drop into both eyes as needed for dry eyes.   dicyclomine (BENTYL) 20 MG tablet Take 1 tablet (20 mg total) by mouth 3 (three) times daily as needed for spasms (abdominal spasm).   hydrOXYzine (ATARAX) 25 MG tablet Take 1 tablet (25 mg total) by mouth every 6 (six) hours.   loperamide (IMODIUM) 2 MG capsule Take 1 capsule (2 mg total) by mouth 4 (four) times daily as needed for diarrhea or loose stools.   methocarbamol (ROBAXIN) 500 MG tablet Take 1 tablet (500 mg total) by mouth every 8 (eight) hours as needed for muscle spasms.   naproxen (NAPROSYN) 375 MG tablet Take 1 tablet (375 mg total) by mouth 2 (two) times daily.   ondansetron (ZOFRAN-ODT) 4 MG disintegrating tablet Take 1 tablet (4 mg total) by mouth every 8 (eight) hours as needed for nausea or vomiting.    Long Term Goals: Improvement in symptoms so as ready for discharge  Short Term Goals: Patient will verbalize feelings in meetings with treatment team members., Patient will attend at least of 50% of the groups daily., Pt will complete the PHQ9 on admission, day 3 and discharge., Patient will participate in completing the [place] Suicide Severity Rating Scale, Patient will score a low risk of violence for 24 hours prior to discharge, and Patient will take medications as prescribed daily.  Medical Decision Making  Inpatient FBC    Meds ordered this encounter  Medications   acetaminophen (TYLENOL) tablet 650 mg   alum & mag hydroxide-simeth (MAALOX/MYLANTA) 200-200-20 MG/5ML suspension 30 mL   magnesium hydroxide (MILK OF MAGNESIA) suspension 30 mL   dicyclomine (BENTYL) tablet 20 mg   hydrOXYzine (ATARAX) tablet 25 mg   loperamide (IMODIUM) capsule 2-4 mg   methocarbamol (ROBAXIN) tablet 500 mg   naproxen (NAPROSYN) tablet 500 mg   ondansetron (ZOFRAN-ODT) disintegrating tablet 4 mg   AND Linked Order Group    OLANZapine zydis (ZYPREXA) disintegrating tablet 10 mg    LORazepam (ATIVAN) tablet 1  mg    ziprasidone (GEODON) injection 20 mg   FOLLOWED BY Linked Order Group    cloNIDine (CATAPRES) tablet 0.1 mg    cloNIDine (CATAPRES) tablet 0.1 mg    cloNIDine (CATAPRES) tablet 0.1 mg    Lab Orders         CBC with Differential/Platelet         Comprehensive metabolic panel         Ethanol         TSH         POC urine preg, ED         POCT Urine  Drug Screen - (I-Screen)      Recommendations  Based on my evaluation the patient does not appear to have an emergency medical condition.  Sindy Guadeloupe, NP 10/09/22  5:47 AM

## 2022-10-08 NOTE — Progress Notes (Signed)
   10/08/22 2104  [facility] Triage Screening (Walk-ins at [facility] only)  How Did You Hear About [place]? Family/Friend  What Is the Reason for Your Visit/Call Today? Pt [facility] to [facility] voluntarily, accompanied by her sister requesting substance abuse treatment/detox. 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 Pt was seen at [facility] last night for same presentation. Pt decided to leave AMA. Pt reports no significant changes since last triaged. Pt reports history of anxiety and is prescribed Gabapentin. Pt denies SI,HI, and AVH.  How Long Has This Been Causing You Problems? 1 wk - 1 month  Have You Recently Had Any Thoughts About Hurting Yourself? No  Are You Planning to Commit Suicide/Harm Yourself At This time? No  Have you Recently Had Thoughts About Hurting Someone Karolee Ohs? No  Are You Planning To Harm Someone At This Time? No  Are you currently experiencing any auditory, visual or other hallucinations? No  Have You Used Any Alcohol or Drugs in the Past 24 Hours? Yes  How long ago did you use Drugs or Alcohol? a few hours ago  What Did You Use and How Much? heroine (unknown quantity)  Do you have any current medical co-morbidities that require immediate attention? No  Clinician description of patient physical appearance/behavior: Cooperative and oriented  What Do You Feel Would Help You the Most Today? Alcohol or Drug Use Treatment  If access to [facility] was not available, would you have sought care in the Emergency Department? Yes  Determination of Need Urgent (48 hours)  Options For Referral Other: Comment;Chemical Dependency Intensive Outpatient Therapy (CDIOP);Facility-Based Crisis
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
## 2022-10-08 NOTE — ED Provider Notes (Signed)
Pt was triage  and room, pt came in with her sister for the same complications.  Pt left AMA without been seen by provider.

## 2022-10-09 DIAGNOSIS — F1123 Opioid dependence with withdrawal: Secondary | ICD-10-CM | POA: Diagnosis not present

## 2022-10-09 DIAGNOSIS — F1911 Other psychoactive substance abuse, in remission: Secondary | ICD-10-CM | POA: Diagnosis not present

## 2022-10-09 DIAGNOSIS — F329 Major depressive disorder, single episode, unspecified: Secondary | ICD-10-CM | POA: Diagnosis not present

## 2022-10-09 DIAGNOSIS — F419 Anxiety disorder, unspecified: Secondary | ICD-10-CM | POA: Diagnosis not present

## 2022-10-09 LAB — CBC WITH DIFFERENTIAL/PLATELET
Abs Immature Granulocytes: 0 10*3/uL (ref 0.00–0.07)
Basophils Absolute: 0 10*3/uL (ref 0.0–0.1)
Basophils Relative: 1 %
Eosinophils Absolute: 0.1 10*3/uL (ref 0.0–0.5)
Eosinophils Relative: 4 %
HCT: 39.2 % (ref 36.0–46.0)
Hemoglobin: 12.4 g/dL (ref 12.0–15.0)
Immature Granulocytes: 0 %
Lymphocytes Relative: 45 %
Lymphs Abs: 1.6 10*3/uL (ref 0.7–4.0)
MCH: 25.7 pg — ABNORMAL LOW (ref 26.0–34.0)
MCHC: 31.6 g/dL (ref 30.0–36.0)
MCV: 81.2 fL (ref 80.0–100.0)
Monocytes Absolute: 0.2 10*3/uL (ref 0.1–1.0)
Monocytes Relative: 7 %
Neutro Abs: 1.5 10*3/uL — ABNORMAL LOW (ref 1.7–7.7)
Neutrophils Relative %: 43 %
Platelets: 190 10*3/uL (ref 150–400)
RBC: 4.83 MIL/uL (ref 3.87–5.11)
RDW: 14.3 % (ref 11.5–15.5)
WBC: 3.6 10*3/uL — ABNORMAL LOW (ref 4.0–10.5)
nRBC: 0 % (ref 0.0–0.2)

## 2022-10-09 LAB — COMPREHENSIVE METABOLIC PANEL
ALT: 14 U/L (ref 0–44)
AST: 16 U/L (ref 15–41)
Albumin: 4 g/dL (ref 3.5–5.0)
Alkaline Phosphatase: 48 U/L (ref 38–126)
Anion gap: 13 (ref 5–15)
BUN: 8 mg/dL (ref 6–20)
CO2: 27 mmol/L (ref 22–32)
Calcium: 9.3 mg/dL (ref 8.9–10.3)
Chloride: 101 mmol/L (ref 98–111)
Creatinine, Ser: 0.7 mg/dL (ref 0.44–1.00)
GFR, Estimated: >60 mL/min (ref >60)
Glucose, Bld: 80 mg/dL (ref 70–99)
Potassium: 4.6 mmol/L (ref 3.5–5.1)
Sodium: 141 mmol/L (ref 135–145)
Total Bilirubin: 0.8 mg/dL (ref 0.3–1.2)
Total Protein: 6 g/dL — ABNORMAL LOW (ref 6.5–8.1)

## 2022-10-09 LAB — HEPATITIS PANEL, ACUTE
HCV Ab: NONREACTIVE
Hep A IgM: NONREACTIVE
Hep B C IgM: NONREACTIVE
Hepatitis B Surface Ag: NONREACTIVE

## 2022-10-09 LAB — TSH: TSH: 0.553 u[IU]/mL (ref 0.350–4.500)

## 2022-10-09 LAB — ETHANOL: Alcohol, Ethyl (B): <10 mg/dL (ref <10)

## 2022-10-09 LAB — HIV ANTIBODY (ROUTINE TESTING W REFLEX): HIV Screen 4th Generation wRfx: NONREACTIVE

## 2022-10-09 MED ORDER — NICOTINE 14 MG/24HR TD PT24
14.0000 mg | MEDICATED_PATCH | Freq: Every day | TRANSDERMAL | Status: DC
  Administered 2022-10-09: 14 mg via TRANSDERMAL
  Filled 2022-10-09 (×2): qty 1

## 2022-10-09 MED ORDER — TRAZODONE HCL 50 MG PO TABS
50.0000 mg | ORAL_TABLET | Freq: Every evening | ORAL | Status: DC | PRN
  Administered 2022-10-09: 50 mg via ORAL
  Filled 2022-10-09: qty 1

## 2022-10-09 MED ORDER — GABAPENTIN 300 MG PO CAPS
600.0000 mg | ORAL_CAPSULE | Freq: Two times a day (BID) | ORAL | Status: DC
  Administered 2022-10-09 – 2022-10-10 (×3): 600 mg via ORAL
  Filled 2022-10-09 (×3): qty 2

## 2022-10-09 NOTE — Tx Team (Signed)
LCSW, MD, and Resident met with patient to assess current mood, affect, physical state, and inquire about needs/goals while here in [facility] and after discharge. Patient reports she presented due to needing to detox from heroin. Patient reports she lives at home alone, however has found it very difficult to detox on her own. Patient was very tearful during interview on yesterday stating "I'm not sure if I can do this". Patient reports when she tries to stop using, the withdrawals are too harsh. Patient reports she has never tried to seek services for herself. Patient reports she has been using heroin daily since June. Prior to that, patient reports on and off use since the age of 27. Patient reports her only motivation to stop using is her 37 year old son who is currently in [facility] due to CPS getting involved and deeming that her place and families place was not a safe environment for her child due to the child's father. Patient reports being on supervised visits with CPS. Patient reports she has also gotten into trouble and is facing trafficking charges for substances. Patient reports she is currently on probation and reports she missed her court date which would like put a warrant out for her arrest. Patient was able to inform team of Probation officers name: April McKay, however was not able to provide a number. LCSW encouraged the patient to follow up with her PO to provide an update and to inform that she is seeking treatment at this time. Patient reports her and her mother has tried multiple times to get in contact with her, however has not received an answer. Patient reports having support from her mother and grandmother. Patient reports she was working at a [employer] in [place], however due to the drug use  she has not been able to report to work. Patient reports she sees an outpatient Provider by the name MD Dagawak for suboxone. Patient reports she does not have therapist at this time. Patient initial  reported that her goal was to seek residential placement for her substance use, however that plan has changed.   Once patient was provided an update that [facility] could accept her on Monday by 9:00am, the patient became very tearful and stated she needed to make some phone calls. Brief supportive counseling was provided to the patient and she was receptive to the feedback. Patient reports she spoke with her mother and believes her plan would be to discharge from this facility, follow up with her suboxone provider, and then just report to jail to do her time. Patient reports she missed her court date and likely has a warrant out for her arrest, and reports she was told by her probation officer that if she did not show up then she would be facing three years minimum time. Patient reports she would just prefer to "do her time and get it over with". LCSW, MD, and mother spoke with the patient regarding her decision and was provided the option of being placed on Subutex to help with withdrawals until treatment on Monday. Patient initially agreed while mother was on the phone. Patient is now requesting to discharge with plans to follow up outpatient as needed. LCSW contacted mother again to inform and provide acceptance information for [facility]. Mother expressed understanding and stated she will assist the patient the best she could with helping her to follow up outpatient or getting her to [facility] on Monday. No other needs were reported at this time.   Patient, mother,  and team aware of plan. Resources provided in AVS for her follow up. [facility] informed of patient request to leave and reports they will keep her on the list for admission if she decides to report there on Monday. No other needs to reported. LCSW to sign off at this time. Please inform if further LCSW needs arise prior to discharge.   Fernande Boyden, LCSW Clinical Social Worker [facility] Ph: [phone]
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
## 2022-10-09 NOTE — Progress Notes (Signed)
Venipuncture completed. Offsite distribution called for STAT pick up.

## 2022-10-09 NOTE — ED Notes (Signed)
Pt refused vitals 

## 2022-10-09 NOTE — ED Notes (Signed)
Patient alert and oriented. Denies SI/HI/AVH. Denies intent or plan to harm self or others. . Encourage patient to notify staff with any needs or concerns. Patient verbalized agreement and understanding. Will continue to monitor for safety.
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
## 2022-10-09 NOTE — ED Provider Notes (Signed)
Behavioral Health Progress Note  Date and Time: 10/09/2022 10:25 AM Name: [name] MRN:  [id number]  Subjective:  [name], 27 y/o female with a history of opioid use disorder (heroine), presented to [facility] for rehab from heroin abuse. Patient was at [facility] yesterday but left AMA and then returned to [facility].   Patient seen this AM. She states using heroine on and off since 27 years old. She has been using daily since June 2024. She used the substance through inhaling. She denies other illicit drug use. She also uses 0.5 pack of cigarettes daily. She wants to stop using substances due to her two year old son. He lives with a foster mother and she is able to see him occasionally. She denies SI, HI, and AVH. She reports withdrawal sxms of hot flashes, yawning, and insomnia.   Diagnosis:  Final diagnoses:  Polysubstance abuse (HCC)  Opioid withdrawal (HCC)  Anxious appearance    Total Time spent with patient: 30 minutes  Past Psychiatric History:  Dx: opioid use disorder, MDD, and anxiety Currently not seeing a psychiatrist or therapist. Currently not taking any prescribed medication.  Took Suboxone was in June 2024  Past Medical History:  Past Medical History:  Diagnosis Date   ADHD (attention deficit hyperactivity disorder)     Anxiety     Cholecystitis     Depression     Dysmenorrhea     Endometriosis     GERD (gastroesophageal reflux disease)     Headache      migraines   Hyperlipidemia     Hypoglycemia     Polysubstance abuse (HCC)     Pregnancy complicated by subutex maintenance, antepartum (HCC)     Syncope    Family History:   Anesthesia problems Maternal Grandfather     Heart disease Maternal Grandfather     Nephrolithiasis Maternal Grandfather     Diabetes Maternal Grandfather     Mental illness Maternal Grandfather     Cholelithiasis Mother     Nephrolithiasis Mother     Depression Mother     Hypertension Mother     Miscarriages / Stillbirths Mother      Anxiety disorder Mother     Gout Father     Nephrolithiasis Maternal Grandmother     COPD Maternal Grandmother     Heart disease Paternal Grandfather     Cholelithiasis Maternal Aunt     Depression Maternal Aunt     Learning disabilities Maternal Aunt     Bipolar disorder Sister    Social History: she lives alone, works at the [employer]   Additional Social History:    Pain Medications: No medications Prescriptions: None Over the Counter: No medications History of alcohol / drug use?: Yes Longest period of sobriety (when/how long): 15 months Negative Consequences of Use:  (UTA) Withdrawal Symptoms:  (UTA)                    Sleep: Poor  Appetite:  Fair  Current Medications:  Current Facility-Administered Medications  Medication Dose Route Frequency Provider Last Rate Last Admin   acetaminophen (TYLENOL) tablet 650 mg  650 mg Oral Q6H PRN Sindy Guadeloupe, NP       alum & mag hydroxide-simeth (MAALOX/MYLANTA) 200-200-20 MG/5ML suspension 30 mL  30 mL Oral Q4H PRN Sindy Guadeloupe, NP       cloNIDine (CATAPRES) tablet 0.1 mg  0.1 mg Oral QID Sindy Guadeloupe, NP   0.1 mg at 10/09/22 0830  Followed by   Melene Muller ON 10/11/2022] cloNIDine (CATAPRES) tablet 0.1 mg  0.1 mg Oral Elgie Collard, NP       Followed by   Melene Muller ON 10/13/2022] cloNIDine (CATAPRES) tablet 0.1 mg  0.1 mg Oral QAC breakfast Sindy Guadeloupe, NP       dicyclomine (BENTYL) tablet 20 mg  20 mg Oral Q6H PRN Sindy Guadeloupe, NP   20 mg at 10/09/22 1308   hydrOXYzine (ATARAX) tablet 25 mg  25 mg Oral Q6H PRN Sindy Guadeloupe, NP       loperamide (IMODIUM) capsule 2-4 mg  2-4 mg Oral PRN Sindy Guadeloupe, NP       OLANZapine zydis (ZYPREXA) disintegrating tablet 10 mg  10 mg Oral Q8H PRN Sindy Guadeloupe, NP       And   LORazepam (ATIVAN) tablet 1 mg  1 mg Oral PRN Sindy Guadeloupe, NP       And   ziprasidone (GEODON) injection 20 mg  20 mg Intramuscular PRN Sindy Guadeloupe, NP       magnesium hydroxide (MILK OF MAGNESIA)  suspension 30 mL  30 mL Oral Daily PRN Sindy Guadeloupe, NP       methocarbamol (ROBAXIN) tablet 500 mg  500 mg Oral Q8H PRN Sindy Guadeloupe, NP   500 mg at 10/09/22 6578   naproxen (NAPROSYN) tablet 500 mg  500 mg Oral BID PRN Sindy Guadeloupe, NP       ondansetron (ZOFRAN-ODT) disintegrating tablet 4 mg  4 mg Oral Q6H PRN Sindy Guadeloupe, NP       Current Outpatient Medications  Medication Sig Dispense Refill   hydrOXYzine (ATARAX) 25 MG tablet Take 1 tablet (25 mg total) by mouth every 6 (six) hours. (Patient taking differently: Take 25 mg by mouth every 6 (six) hours as needed for anxiety.) 12 tablet 0   ibuprofen (ADVIL) 800 MG tablet Take 800 mg by mouth every 8 (eight) hours as needed (for pain).     methocarbamol (ROBAXIN) 500 MG tablet Take 1 tablet (500 mg total) by mouth every 8 (eight) hours as needed for muscle spasms. 15 tablet 0   naproxen (NAPROSYN) 375 MG tablet Take 1 tablet (375 mg total) by mouth 2 (two) times daily. (Patient taking differently: Take 375 mg by mouth 2 (two) times daily as needed (For pain).) 20 tablet 0   ondansetron (ZOFRAN-ODT) 4 MG disintegrating tablet Take 1 tablet (4 mg total) by mouth every 8 (eight) hours as needed for nausea or vomiting. 10 tablet 0   gabapentin (NEURONTIN) 600 MG tablet Take 600 mg by mouth every 12 (twelve) hours.      Labs  Lab Results:  Admission on 10/08/2022  Component Date Value Ref Range Status   WBC 10/09/2022 3.6 (L)  4.0 - 10.5 K/uL Final   RBC 10/09/2022 4.83  3.87 - 5.11 MIL/uL Final   Hemoglobin 10/09/2022 12.4  12.0 - 15.0 g/dL Final   HCT 46/96/2952 39.2  36.0 - 46.0 % Final   MCV 10/09/2022 81.2  80.0 - 100.0 fL Final   MCH 10/09/2022 25.7 (L)  26.0 - 34.0 pg Final   MCHC 10/09/2022 31.6  30.0 - 36.0 g/dL Final   RDW 84/13/2440 14.3  11.5 - 15.5 % Final   Platelets 10/09/2022 190  150 - 400 K/uL Final   nRBC 10/09/2022 0.0  0.0 - 0.2 % Final   Neutrophils Relative % 10/09/2022 43  % Final   Neutro Abs 10/09/2022 1.5  (L)  1.7 -  7.7 K/uL Final   Lymphocytes Relative 10/09/2022 45  % Final   Lymphs Abs 10/09/2022 1.6  0.7 - 4.0 K/uL Final   Monocytes Relative 10/09/2022 7  % Final   Monocytes Absolute 10/09/2022 0.2  0.1 - 1.0 K/uL Final   Eosinophils Relative 10/09/2022 4  % Final   Eosinophils Absolute 10/09/2022 0.1  0.0 - 0.5 K/uL Final   Basophils Relative 10/09/2022 1  % Final   Basophils Absolute 10/09/2022 0.0  0.0 - 0.1 K/uL Final   Immature Granulocytes 10/09/2022 0  % Final   Abs Immature Granulocytes 10/09/2022 0.00  0.00 - 0.07 K/uL Final   Performed at [facility], [address] [place], [place] [address]   Sodium 10/09/2022 141  135 - 145 mmol/L Final   Potassium 10/09/2022 4.6  3.5 - 5.1 mmol/L Final   Chloride 10/09/2022 101  98 - 111 mmol/L Final   CO2 10/09/2022 27  22 - 32 mmol/L Final   Glucose, Bld 10/09/2022 80  70 - 99 mg/dL Final   Glucose reference range applies only to samples taken after fasting for at least 8 hours.   BUN 10/09/2022 8  6 - 20 mg/dL Final   Creatinine, Ser 10/09/2022 0.70  0.44 - 1.00 mg/dL Final   Calcium 60/45/4098 9.3  8.9 - 10.3 mg/dL Final   Total Protein 11/91/4782 6.0 (L)  6.5 - 8.1 g/dL Final   Albumin 95/62/1308 4.0  3.5 - 5.0 g/dL Final   AST 65/78/4696 16  15 - 41 U/L Final   ALT 10/09/2022 14  0 - 44 U/L Final   Alkaline Phosphatase 10/09/2022 48  38 - 126 U/L Final   Total Bilirubin 10/09/2022 0.8  0.3 - 1.2 mg/dL Final   GFR, Estimated 10/09/2022 >60  >60 mL/min Final   Comment: (NOTE) Calculated using the CKD-EPI Creatinine Equation (2021)    Anion gap 10/09/2022 13  5 - 15 Final   Performed at [facility], [address], [place], [place] [address]   Alcohol, Ethyl (B) 10/09/2022 <10  <10 mg/dL Final   Comment: (NOTE) Lowest detectable limit for serum alcohol is 10 mg/dL.  For medical purposes only. Performed at [facility], [address] [place], [place] [address]    TSH 10/09/2022 0.553  0.350 - 4.500  uIU/mL Final   Comment: Performed by a 3rd Generation assay with a functional sensitivity of <=0.01 uIU/mL. Performed at [facility], [address], [place], [place] [address]   Admission on 09/16/2022, Discharged on 09/17/2022  Component Date Value Ref Range Status   WBC 09/16/2022 4.8  4.0 - 10.5 K/uL Final   RBC 09/16/2022 4.76  3.87 - 5.11 MIL/uL Final   Hemoglobin 09/16/2022 12.0  12.0 - 15.0 g/dL Final   HCT 72/53/6644 39.3  36.0 - 46.0 % Final   MCV 09/16/2022 82.6  80.0 - 100.0 fL Final   MCH 09/16/2022 25.2 (L)  26.0 - 34.0 pg Final   MCHC 09/16/2022 30.5  30.0 - 36.0 g/dL Final   RDW 03/47/4259 14.3  11.5 - 15.5 % Final   Platelets 09/16/2022 222  150 - 400 K/uL Final   nRBC 09/16/2022 0.0  0.0 - 0.2 % Final   Neutrophils Relative % 09/16/2022 47  % Final   Neutro Abs 09/16/2022 2.2  1.7 - 7.7 K/uL Final   Lymphocytes Relative 09/16/2022 44  % Final   Lymphs Abs 09/16/2022 2.1  0.7 - 4.0  K/uL Final   Monocytes Relative 09/16/2022 6  % Final   Monocytes Absolute 09/16/2022 0.3  0.1 - 1.0 K/uL Final   Eosinophils Relative 09/16/2022 2  % Final   Eosinophils Absolute 09/16/2022 0.1  0.0 - 0.5 K/uL Final   Basophils Relative 09/16/2022 1  % Final   Basophils Absolute 09/16/2022 0.0  0.0 - 0.1 K/uL Final   Immature Granulocytes 09/16/2022 0  % Final   Abs Immature Granulocytes 09/16/2022 0.01  0.00 - 0.07 K/uL Final   Performed at [facility], [address] [place], [place] [address]   Sodium 09/16/2022 140  135 - 145 mmol/L Final   Potassium 09/16/2022 3.4 (L)  3.5 - 5.1 mmol/L Final   Chloride 09/16/2022 102  98 - 111 mmol/L Final   CO2 09/16/2022 29  22 - 32 mmol/L Final   Glucose, Bld 09/16/2022 115 (H)  70 - 99 mg/dL Final   Glucose reference range applies only to samples taken after fasting for at least 8 hours.   BUN 09/16/2022 25 (H)  6 - 20 mg/dL Final   Creatinine, Ser 09/16/2022 0.90  0.44 - 1.00 mg/dL Final   Calcium 91/47/8295 9.0  8.9 - 10.3  mg/dL Final   Total Protein 62/13/0865 6.4 (L)  6.5 - 8.1 g/dL Final   Albumin 78/46/9629 4.1  3.5 - 5.0 g/dL Final   AST 52/84/1324 15  15 - 41 U/L Final   ALT 09/16/2022 15  0 - 44 U/L Final   Alkaline Phosphatase 09/16/2022 43  38 - 126 U/L Final   Total Bilirubin 09/16/2022 0.4  0.3 - 1.2 mg/dL Final   GFR, Estimated 09/16/2022 >60  >60 mL/min Final   Comment: (NOTE) Calculated using the CKD-EPI Creatinine Equation (2021)    Anion gap 09/16/2022 9  5 - 15 Final   Performed at [facility], [address] [place], [place] [address]   Opiates 09/16/2022 POSITIVE (A)  NONE DETECTED Final   Cocaine 09/16/2022 POSITIVE (A)  NONE DETECTED Final   Benzodiazepines 09/16/2022 POSITIVE (A)  NONE DETECTED Final   Amphetamines 09/16/2022 NONE DETECTED  NONE DETECTED Final   Tetrahydrocannabinol 09/16/2022 NONE DETECTED  NONE DETECTED Final   Barbiturates 09/16/2022 NONE DETECTED  NONE DETECTED Final   Comment: (NOTE) DRUG SCREEN FOR MEDICAL PURPOSES ONLY.  IF CONFIRMATION IS NEEDED FOR ANY PURPOSE, NOTIFY LAB WITHIN 5 DAYS.  LOWEST DETECTABLE LIMITS FOR URINE DRUG SCREEN Drug Class                     Cutoff (ng/mL) Amphetamine and metabolites    1000 Barbiturate and metabolites    200 Benzodiazepine                 200 Opiates and metabolites        300 Cocaine and metabolites        300 THC                            50 Performed at [facility], [address] [place], [place] [address]    Preg, Serum 09/16/2022 NEGATIVE  NEGATIVE Final   Comment:        THE SENSITIVITY OF THIS METHODOLOGY IS >10 mIU/mL. Performed at [facility], [address], [place], [place] [address]   Admission on 09/12/2022, Discharged on 09/13/2022  Component Date Value Ref Range Status  Sodium 09/12/2022 138  135 - 145 mmol/L Final   Potassium 09/12/2022 4.8  3.5 - 5.1 mmol/L Final   Chloride 09/12/2022 102  98 - 111 mmol/L Final   CO2 09/12/2022 27  22 - 32 mmol/L Final    Glucose, Bld 09/12/2022 93  70 - 99 mg/dL Final   Glucose reference range applies only to samples taken after fasting for at least 8 hours.   BUN 09/12/2022 13  6 - 20 mg/dL Final   Creatinine, Ser 09/12/2022 0.57  0.44 - 1.00 mg/dL Final   Calcium 16/10/9602 9.6  8.9 - 10.3 mg/dL Final   Total Protein 54/09/8117 7.6  6.5 - 8.1 g/dL Final   Albumin 14/78/2956 4.5  3.5 - 5.0 g/dL Final   AST 21/30/8657 24  15 - 41 U/L Final   ALT 09/12/2022 23  0 - 44 U/L Final   Alkaline Phosphatase 09/12/2022 58  38 - 126 U/L Final   Total Bilirubin 09/12/2022 0.5  0.3 - 1.2 mg/dL Final   GFR, Estimated 09/12/2022 >60  >60 mL/min Final   Comment: (NOTE) Calculated using the CKD-EPI Creatinine Equation (2021)    Anion gap 09/12/2022 9  5 - 15 Final   Performed at [facility], [address], [place], [place] [address]   Alcohol, Ethyl (B) 09/12/2022 <10  <10 mg/dL Final   Comment: (NOTE) Lowest detectable limit for serum alcohol is 10 mg/dL.  For medical purposes only. Performed at [facility], [address] [place], [place] [address]    WBC 09/12/2022 3.3 (L)  4.0 - 10.5 K/uL Final   RBC 09/12/2022 5.06  3.87 - 5.11 MIL/uL Final   Hemoglobin 09/12/2022 13.1  12.0 - 15.0 g/dL Final   HCT 41/32/4401 42.4  36.0 - 46.0 % Final   MCV 09/12/2022 83.8  80.0 - 100.0 fL Final   MCH 09/12/2022 25.9 (L)  26.0 - 34.0 pg Final   MCHC 09/12/2022 30.9  30.0 - 36.0 g/dL Final   RDW 02/72/5366 14.1  11.5 - 15.5 % Final   Platelets 09/12/2022 211  150 - 400 K/uL Final   nRBC 09/12/2022 0.0  0.0 - 0.2 % Final   Performed at [facility], [address] [place], [place] [address]   Opiates 09/12/2022 POSITIVE (A)  NONE DETECTED Final   Cocaine 09/12/2022 POSITIVE (A)  NONE DETECTED Final   Benzodiazepines 09/12/2022 POSITIVE (A)  NONE DETECTED Final   Amphetamines 09/12/2022 NONE DETECTED  NONE DETECTED Final   Tetrahydrocannabinol 09/12/2022 NONE  DETECTED  NONE DETECTED Final   Barbiturates 09/12/2022 NONE DETECTED  NONE DETECTED Final   Comment: (NOTE) DRUG SCREEN FOR MEDICAL PURPOSES ONLY.  IF CONFIRMATION IS NEEDED FOR ANY PURPOSE, NOTIFY LAB WITHIN 5 DAYS.  LOWEST DETECTABLE LIMITS FOR URINE DRUG SCREEN Drug Class                     Cutoff (ng/mL) Amphetamine and metabolites    1000 Barbiturate and metabolites    200 Benzodiazepine                 200 Opiates and metabolites        300 Cocaine and metabolites        300 THC                            50 Performed at [facility], [address] [place], [place] [address]    Preg, Serum 09/12/2022 NEGATIVE  NEGATIVE Final   Comment:        THE SENSITIVITY OF THIS METHODOLOGY IS >10 mIU/mL. Performed at [facility], [address], [place], [place] [address]    Specimen Source 09/12/2022 URINE, CLEAN CATCH   Final   Color, Urine 09/12/2022 YELLOW  YELLOW Final   APPearance 09/12/2022 CLEAR  CLEAR Final   Specific Gravity, Urine 09/12/2022 1.023  1.005 - 1.030 Final   pH 09/12/2022 6.0  5.0 - 8.0 Final   Glucose, UA 09/12/2022 NEGATIVE  NEGATIVE mg/dL Final   Hgb urine dipstick 09/12/2022 NEGATIVE  NEGATIVE Final   Bilirubin Urine 09/12/2022 NEGATIVE  NEGATIVE Final   Ketones, ur 09/12/2022 NEGATIVE  NEGATIVE mg/dL Final   Protein, ur 56/21/3086 NEGATIVE  NEGATIVE mg/dL Final   Nitrite 57/84/6962 NEGATIVE  NEGATIVE Final   Leukocytes,Ua 09/12/2022 NEGATIVE  NEGATIVE Final   RBC / HPF 09/12/2022 0-5  0 - 5 RBC/hpf Final   WBC, UA 09/12/2022 0-5  0 - 5 WBC/hpf Final   Comment:        Reflex urine culture not performed if WBC <=10, OR if Squamous epithelial cells >5. If Squamous epithelial cells >5 suggest recollection.    Bacteria, UA 09/12/2022 NONE SEEN  NONE SEEN Final   Squamous Epithelial / HPF 09/12/2022 0-5  0 - 5 /HPF Final   Mucus 09/12/2022 PRESENT   Final   Performed at [facility], [address], [place], [place] [address]  Admission on 04/13/2022, Discharged on 04/17/2022  Component Date Value Ref Range Status   Glucose-Capillary 04/13/2022 92  70 - 99 mg/dL Final   Glucose reference range applies only to samples taken after fasting for at least 8 hours.   Sodium 04/13/2022 137  135 - 145 mmol/L Final   Potassium 04/13/2022 3.8  3.5 - 5.1 mmol/L Final   Chloride 04/13/2022 104  98 - 111 mmol/L Final   CO2 04/13/2022 23  22 - 32 mmol/L Final   Glucose, Bld 04/13/2022 83  70 - 99 mg/dL Final   Glucose reference range applies only to samples taken after fasting for at least 8 hours.   BUN 04/13/2022 18  6 - 20 mg/dL Final   Creatinine, Ser 04/13/2022 1.43 (H)  0.44 - 1.00 mg/dL Final   Calcium 13/24/4010 9.3  8.9 - 10.3 mg/dL Final   Total Protein 27/25/3664 6.6  6.5 - 8.1 g/dL Final   Albumin 40/34/7425 4.1  3.5 - 5.0 g/dL Final   AST 95/63/8756 28  15 - 41 U/L Final   ALT 04/13/2022 20  0 - 44 U/L Final   Alkaline Phosphatase 04/13/2022 51  38 - 126 U/L Final   Total Bilirubin 04/13/2022 0.4  0.3 - 1.2 mg/dL Final   GFR, Estimated 04/13/2022 52 (L)  >60 mL/min Final   Comment: (NOTE) Calculated using the CKD-EPI Creatinine Equation (2021)    Anion gap 04/13/2022 10  5 - 15 Final   Performed at [facility], [address], [place], [place] [address]   Salicylate Lvl 04/13/2022 <7.0 (L)  7.0 - 30.0 mg/dL Final   Performed at [facility], [address] [place], [place] [address]   Acetaminophen (Tylenol), Serum 04/13/2022 <10 (L)  10 - 30 ug/mL Final   Comment: (NOTE) Therapeutic concentrations vary significantly. A range of 10-30 ug/mL  may be an effective concentration for many patients.  However, some  are best treated at concentrations outside of this range. Acetaminophen concentrations >150 ug/mL at 4 hours after ingestion  and >50 ug/mL at 12 hours after ingestion are often associated with  toxic reactions.  Performed  at [facility], [address], [place], [place] [address]    Alcohol, Ethyl (B) 04/13/2022 <10  <10 mg/dL Final   Comment: (NOTE) Lowest detectable limit for serum alcohol is 10 mg/dL.  For medical purposes only. Performed at [facility], [address] [place] [address], [place], [place] [address]    Opiates 04/13/2022 POSITIVE (A)  NONE DETECTED Final   Cocaine 04/13/2022 POSITIVE (A)  NONE DETECTED Final   Benzodiazepines 04/13/2022 POSITIVE (A)  NONE DETECTED Final   Amphetamines 04/13/2022 NONE DETECTED  NONE DETECTED Final   Tetrahydrocannabinol 04/13/2022 NONE DETECTED  NONE DETECTED Final   Barbiturates 04/13/2022 NONE DETECTED  NONE DETECTED Final   Comment: (NOTE) DRUG SCREEN FOR MEDICAL PURPOSES ONLY.  IF CONFIRMATION IS NEEDED FOR ANY PURPOSE, NOTIFY LAB WITHIN 5 DAYS.  LOWEST DETECTABLE LIMITS FOR URINE DRUG SCREEN Drug Class                     Cutoff (ng/mL) Amphetamine and metabolites    1000 Barbiturate and metabolites    200 Benzodiazepine                 200 Opiates and metabolites        300 Cocaine and metabolites        300 THC                            50 Performed at [facility], [address] [place], [place] [address]    WBC 04/13/2022 5.0  4.0 - 10.5 K/uL Final   RBC 04/13/2022 4.46  3.87 - 5.11 MIL/uL Final   Hemoglobin 04/13/2022 11.6 (L)  12.0 - 15.0 g/dL Final   HCT 13/08/6576 36.5  36.0 - 46.0 % Final   MCV 04/13/2022 81.8  80.0 - 100.0 fL Final   MCH 04/13/2022 26.0  26.0 - 34.0 pg Final   MCHC 04/13/2022 31.8  30.0 - 36.0 g/dL Final   RDW 46/96/2952 13.6  11.5 - 15.5 % Final   Platelets 04/13/2022 191  150 - 400 K/uL Final   nRBC 04/13/2022 0.0  0.0 - 0.2 % Final   Neutrophils Relative % 04/13/2022 39  % Final   Neutro Abs 04/13/2022 1.9  1.7 - 7.7 K/uL Final   Lymphocytes Relative 04/13/2022 42  % Final   Lymphs Abs 04/13/2022 2.1  0.7 - 4.0 K/uL Final   Monocytes Relative 04/13/2022 10   % Final   Monocytes Absolute 04/13/2022 0.5  0.1 - 1.0 K/uL Final   Eosinophils Relative 04/13/2022 8  % Final   Eosinophils Absolute 04/13/2022 0.4  0.0 - 0.5 K/uL Final   Basophils Relative 04/13/2022 1  % Final   Basophils Absolute 04/13/2022 0.0  0.0 - 0.1 K/uL Final   Immature Granulocytes 04/13/2022 0  % Final   Abs Immature Granulocytes 04/13/2022 0.01  0.00 - 0.07 K/uL Final   Performed at [facility], [address], [place], [place] [address]   I-stat hCG, quantitative 04/13/2022 <5.0  <5 mIU/mL Final   Comment 3 04/13/2022          Final   Comment:   GEST. AGE  CONC.  (mIU/mL)   <=1 WEEK        5 - 50     2 WEEKS       50 - 500     3 WEEKS       100 - 10,000     4 WEEKS     1,000 - 30,000        FEMALE AND NON-PREGNANT FEMALE:     LESS THAN 5 mIU/mL    SARS Coronavirus 2 by RT PCR 04/13/2022 NEGATIVE  NEGATIVE Final   Comment: (NOTE) SARS-CoV-2 target nucleic acids are NOT DETECTED.  The SARS-CoV-2 RNA is generally detectable in upper respiratory specimens during the acute phase of infection. The lowest concentration of SARS-CoV-2 viral copies this assay can detect is 138 copies/mL. A negative result does not preclude SARS-Cov-2 infection and should not be used as the sole basis for treatment or other patient management decisions. A negative result may occur with  improper specimen collection/handling, submission of specimen other than nasopharyngeal swab, presence of viral mutation(s) within the areas targeted by this assay, and inadequate number of viral copies(<138 copies/mL). A negative result must be combined with clinical observations, patient history, and epidemiological information. The expected result is Negative.  Fact Sheet for Patients:  BloggerCourse.com  Fact Sheet for Healthcare Providers:  SeriousBroker.it  This test is no                          t yet approved or cleared by  the [place] FDA and  has been authorized for detection and/or diagnosis of SARS-CoV-2 by FDA under an Emergency Use Authorization (EUA). This EUA will remain  in effect (meaning this test can be used) for the duration of the COVID-19 declaration under Section 564(b)(1) of the Act, 21 U.S.C.section 360bbb-3(b)(1), unless the authorization is terminated  or revoked sooner.       Influenza A by PCR 04/13/2022 NEGATIVE  NEGATIVE Final   Influenza B by PCR 04/13/2022 NEGATIVE  NEGATIVE Final   Comment: (NOTE) The Xpert Xpress SARS-CoV-2/FLU/RSV plus assay is intended as an aid in the diagnosis of influenza from Nasopharyngeal swab specimens and should not be used as a sole basis for treatment. Nasal washings and aspirates are unacceptable for Xpert Xpress SARS-CoV-2/FLU/RSV testing.  Fact Sheet for Patients: BloggerCourse.com  Fact Sheet for Healthcare Providers: SeriousBroker.it  This test is not yet approved or cleared by the [place] FDA and has been authorized for detection and/or diagnosis of SARS-CoV-2 by FDA under an Emergency Use Authorization (EUA). This EUA will remain in effect (meaning this test can be used) for the duration of the COVID-19 declaration under Section 564(b)(1) of the Act, 21 U.S.C. section 360bbb-3(b)(1), unless the authorization is terminated or revoked.     Resp Syncytial Virus by PCR 04/13/2022 NEGATIVE  NEGATIVE Final   Comment: (NOTE) Fact Sheet for Patients: BloggerCourse.com  Fact Sheet for Healthcare Providers: SeriousBroker.it  This test is not yet approved or cleared by the [place] FDA and has been authorized for detection and/or diagnosis of SARS-CoV-2 by FDA under an Emergency Use Authorization (EUA). This EUA will remain in effect (meaning this test can be used) for the duration of the COVID-19 declaration under Section  564(b)(1) of the Act, 21 U.S.C. section 360bbb-3(b)(1), unless the authorization is terminated or revoked.  Performed at [facility], [address] [place], [place] [address]    FIO2 04/13/2022 100  %  Final   Mode 04/13/2022 PRESSURE REGULATED VOLUME CONTROL   Final   MECHVT 04/13/2022 500  mL Final   RATE 04/13/2022 16  resp/min Final   PEEP 04/13/2022 5  cm H20 Final   pH, Arterial 04/13/2022 7.41  7.35 - 7.45 Final   pCO2 arterial 04/13/2022 36  32 - 48 mmHg Final   pO2, Arterial 04/13/2022 489 (H)  83 - 108 mmHg Final   Bicarbonate 04/13/2022 22.8  20.0 - 28.0 mmol/L Final   Acid-base deficit 04/13/2022 1.4  0.0 - 2.0 mmol/L Final   O2 Saturation 04/13/2022 99.2  % Final   Patient temperature 04/13/2022 37.0   Final   Collection site 04/13/2022 RIGHT RADIAL   Final   Drawn by 04/13/2022 29528   Final   Allens test (pass/fail) 04/13/2022 PASS  PASS Final   Performed at [facility], [address] [place], [place] [address]   HIV Screen 4th Generation wRfx 04/13/2022 Non Reactive  Non Reactive Final   Performed at [facility], [address] [place], [place] [address]   Total CK 04/13/2022 326 (H)  38 - 234 U/L Final   Performed at [facility], [address] [place], [place] [address]   MRSA by PCR Next Gen 04/13/2022 NOT DETECTED  NOT DETECTED Final   Comment: (NOTE) The GeneXpert MRSA Assay (FDA approved for NASAL specimens only), is one component of a comprehensive MRSA colonization surveillance program. It is not intended to diagnose MRSA infection nor to guide or monitor treatment for MRSA infections. Test performance is not FDA approved in patients less than 5 years old. Performed at [facility], [address] [place] [address] [place], [place] [address]    Magnesium 04/13/2022 2.1  1.7 - 2.4 mg/dL Final   Performed at [facility], [address], [place], [place] [address]    Magnesium 04/13/2022 2.1  1.7 - 2.4 mg/dL Final   Performed at [facility], [address], [place], [place] [address]   Phosphorus 04/13/2022 4.8 (H)  2.5 - 4.6 mg/dL Final   Performed at [facility], [address], [place], [place] [address]   Phosphorus 04/13/2022 4.0  2.5 - 4.6 mg/dL Final   Performed at [facility], [address], [place], [place] [address]   Glucose-Capillary 04/13/2022 93  70 - 99 mg/dL Final   Glucose reference range applies only to samples taken after fasting for at least 8 hours.   Glucose-Capillary 04/13/2022 93  70 - 99 mg/dL Final   Glucose reference range applies only to samples taken after fasting for at least 8 hours.   WBC 04/14/2022 5.7  4.0 - 10.5 K/uL Final   RBC 04/14/2022 4.06  3.87 - 5.11 MIL/uL Final   Hemoglobin 04/14/2022 10.6 (L)  12.0 - 15.0 g/dL Final   HCT 88/41/6606 34.3 (L)  36.0 - 46.0 % Final   MCV 04/14/2022 84.5  80.0 - 100.0 fL Final   MCH 04/14/2022 26.1  26.0 - 34.0 pg Final   MCHC 04/14/2022 30.9  30.0 - 36.0 g/dL Final   RDW 30/16/0109 13.8  11.5 - 15.5 % Final   Platelets 04/14/2022 121 (L)  150 - 400 K/uL Final   nRBC 04/14/2022 0.0  0.0 - 0.2 % Final   Performed at [facility], [address], [place], [place] [address]   Sodium 04/14/2022 137  135 - 145 mmol/L Final  Potassium 04/14/2022 3.8  3.5 - 5.1 mmol/L Final   Chloride 04/14/2022 109  98 - 111 mmol/L Final   CO2 04/14/2022 21 (L)  22 - 32 mmol/L Final   Glucose, Bld 04/14/2022 98  70 - 99 mg/dL Final   Glucose reference range applies only to samples taken after fasting for at least 8 hours.   BUN 04/14/2022 19  6 - 20 mg/dL Final   Creatinine, Ser 04/14/2022 0.77  0.44 - 1.00 mg/dL Final   Calcium 16/10/9602 7.5 (L)  8.9 - 10.3 mg/dL Final   GFR, Estimated 04/14/2022 >60  >60 mL/min Final   Comment: (NOTE) Calculated using the CKD-EPI Creatinine Equation (2021)    Anion gap  04/14/2022 7  5 - 15 Final   Performed at [facility], [address], [place], [place] [address]   Magnesium 04/14/2022 2.1  1.7 - 2.4 mg/dL Final   Performed at [facility], [address], [place], [place] [address]   Phosphorus 04/14/2022 3.0  2.5 - 4.6 mg/dL Final   Performed at [facility], [address], [place], [place] [address]   Triglycerides 04/14/2022 63  <150 mg/dL Final   Performed at [facility] [place] [facility], [address] [place], [place] [address]   Glucose-Capillary 04/13/2022 73  70 - 99 mg/dL Final   Glucose reference range applies only to samples taken after fasting for at least 8 hours.   Comment 1 04/13/2022 Notify RN   Final   Magnesium 04/14/2022 1.9  1.7 - 2.4 mg/dL Final   Performed at [facility], [address] [place], [place] [address]   Phosphorus 04/14/2022 2.5  2.5 - 4.6 mg/dL Final   Performed at [facility], [address], [place], [place] [address]   Glucose-Capillary 04/13/2022 97  70 - 99 mg/dL Final   Glucose reference range applies only to samples taken after fasting for at least 8 hours.   Glucose-Capillary 04/14/2022 82  70 - 99 mg/dL Final   Glucose reference range applies only to samples taken after fasting for at least 8 hours.   Specimen Description 04/14/2022    Final                   Value:TRACHEAL ASPIRATE Performed at [facility], [address] [place], [place] [address]    Special Requests 04/14/2022    Final                   Value:NONE Performed at [facility], [address] [place], [place] [address]    Gram Stain 04/14/2022    Final                   Value:FEW WBC PRESENT,BOTH PMN AND MONONUCLEAR RARE GRAM POSITIVE COCCI IN PAIRS Performed at [facility], [address] [place], [place] [address]    Culture 04/14/2022 MODERATE STREPTOCOCCUS PNEUMONIAE   Final    Report Status 04/14/2022 04/17/2022 FINAL   Final   Organism ID, Bacteria 04/14/2022 STREPTOCOCCUS PNEUMONIAE   Final   Glucose-Capillary 04/14/2022 110 (H)  70 - 99 mg/dL Final   Glucose reference range applies only to samples taken after fasting for at least 8 hours.   Comment 1 04/14/2022 Notify RN   Final   Comment 2 04/14/2022 Document in Chart   Final   Sodium 04/15/2022 136  135 - 145 mmol/L Final  Potassium 04/15/2022 2.8 (L)  3.5 - 5.1 mmol/L Final   Chloride 04/15/2022 109  98 - 111 mmol/L Final   CO2 04/15/2022 20 (L)  22 - 32 mmol/L Final   Glucose, Bld 04/15/2022 180 (H)  70 - 99 mg/dL Final   Glucose reference range applies only to samples taken after fasting for at least 8 hours.   BUN 04/15/2022 8  6 - 20 mg/dL Final   Creatinine, Ser 04/15/2022 0.63  0.44 - 1.00 mg/dL Final   Calcium 16/10/9602 7.4 (L)  8.9 - 10.3 mg/dL Final   GFR, Estimated 04/15/2022 >60  >60 mL/min Final   Comment: (NOTE) Calculated using the CKD-EPI Creatinine Equation (2021)    Anion gap 04/15/2022 7  5 - 15 Final   Performed at [facility], [address], [place], [place] [address]   Magnesium 04/15/2022 1.9  1.7 - 2.4 mg/dL Final   Performed at [facility], [address] [place], [place] [address]   Phosphorus 04/15/2022 1.2 (L)  2.5 - 4.6 mg/dL Final   Performed at [facility], [address], [place], [place] [address]   Procalcitonin 04/15/2022 <0.10  ng/mL Final   Comment:        Interpretation: PCT (Procalcitonin) <= 0.5 ng/mL: Systemic infection (sepsis) is not likely. Local bacterial infection is possible. (NOTE)       Sepsis PCT Algorithm           Lower Respiratory Tract                                      Infection PCT Algorithm    ----------------------------     ----------------------------         PCT < 0.25 ng/mL                PCT < 0.10 ng/mL          Strongly encourage             Strongly discourage    discontinuation of antibiotics    initiation of antibiotics    ----------------------------     -----------------------------       PCT 0.25 - 0.50 ng/mL            PCT 0.10 - 0.25 ng/mL               OR       >80% decrease in PCT            Discourage initiation of                                            antibiotics      Encourage discontinuation           of antibiotics    ----------------------------     -----------------------------         PCT >= 0.50 ng/mL              PCT 0.26 - 0.50 ng/mL               AND                                 <80% decrease  in PCT             Encourage initiation of                                             antibiotics       Encourage continuation           of antibiotics    ----------------------------     -----------------------------        PCT >= 0.50 ng/mL                  PCT > 0.50 ng/mL               AND         increase in PCT                  Strongly encourage                                      initiation of antibiotics    Strongly encourage escalation           of antibiotics                                     -----------------------------                                           PCT <= 0.25 ng/mL                                                 OR                                        > 80% decrease in PCT                                      Discontinue / Do not initiate                                             antibiotics  Performed at [facility], [address] [place], [place] [address]    Total CK 04/15/2022 456 (H)  38 - 234 U/L Final   Performed at [facility], [address] [place], [place] [address]   Sodium 04/16/2022 136  135 - 145 mmol/L Final   Potassium 04/16/2022 3.9  3.5 - 5.1 mmol/L Final   Chloride 04/16/2022 112 (H)  98 - 111 mmol/L Final   CO2 04/16/2022 20 (L)  22 - 32 mmol/L Final   Glucose, Bld 04/16/2022 128 (H)  70 - 99 mg/dL Final   Glucose  reference range applies only to  samples taken after fasting for at least 8 hours.   BUN 04/16/2022 6  6 - 20 mg/dL Final   Creatinine, Ser 04/16/2022 0.57  0.44 - 1.00 mg/dL Final   Calcium 29/52/8413 8.2 (L)  8.9 - 10.3 mg/dL Final   GFR, Estimated 04/16/2022 >60  >60 mL/min Final   Comment: (NOTE) Calculated using the CKD-EPI Creatinine Equation (2021)    Anion gap 04/16/2022 4 (L)  5 - 15 Final   Performed at [facility], [address], [place], [place] [address]   WBC 04/16/2022 2.6 (L)  4.0 - 10.5 K/uL Final   RBC 04/16/2022 4.11  3.87 - 5.11 MIL/uL Final   Hemoglobin 04/16/2022 10.6 (L)  12.0 - 15.0 g/dL Final   HCT 02/72/5366 34.8 (L)  36.0 - 46.0 % Final   MCV 04/16/2022 84.7  80.0 - 100.0 fL Final   MCH 04/16/2022 25.8 (L)  26.0 - 34.0 pg Final   MCHC 04/16/2022 30.5  30.0 - 36.0 g/dL Final   RDW 44/03/4740 14.0  11.5 - 15.5 % Final   Platelets 04/16/2022 118 (L)  150 - 400 K/uL Final   Comment: CONSISTENT WITH PREVIOUS RESULT REPEATED TO VERIFY    nRBC 04/16/2022 0.0  0.0 - 0.2 % Final   Performed at [facility], [address] [place] [address], [place], [place] [address]   Magnesium 04/16/2022 2.0  1.7 - 2.4 mg/dL Final   Performed at [facility], [address] [place], [place] [address]   Vitamin B-12 04/16/2022 274  180 - 914 pg/mL Final   Comment: (NOTE) This assay is not validated for testing neonatal or myeloproliferative syndrome specimens for Vitamin B12 levels. Performed at [facility], [address], [place], [place] [address]    TSH 04/16/2022 0.343 (L)  0.350 - 4.500 uIU/mL Final   Comment: Performed by a 3rd Generation assay with a functional sensitivity of <=0.01 uIU/mL. Performed at [facility], [address], [place], [place] [address]    Free T4 04/16/2022 0.60 (L)  0.61 - 1.12 ng/dL Final   Comment: (NOTE) Biotin ingestion may interfere with free T4 tests. If the  results are inconsistent with the TSH level, previous test results, or the clinical presentation, then consider biotin interference. If needed, order repeat testing after stopping biotin. Performed at [facility], [address], [place], [place] [address]    Phosphorus 04/16/2022 2.6  2.5 - 4.6 mg/dL Final   Performed at [facility], [address] [place], [place] [address]   Total CK 04/16/2022 400 (H)  38 - 234 U/L Final   Performed at [facility], [address], [place], [place] [address]   WBC 04/17/2022 4.6  4.0 - 10.5 K/uL Final   RBC 04/17/2022 3.55 (L)  3.87 - 5.11 MIL/uL Final   Hemoglobin 04/17/2022 9.4 (L)  12.0 - 15.0 g/dL Final   HCT 55/73/2202 29.7 (L)  36.0 - 46.0 % Final   MCV 04/17/2022 83.7  80.0 - 100.0 fL Final   MCH 04/17/2022 26.5  26.0 - 34.0 pg Final   MCHC 04/17/2022 31.6  30.0 - 36.0 g/dL Final   RDW 54/27/0623 13.9  11.5 - 15.5 % Final   Platelets 04/17/2022 213  150 - 400 K/uL Final   nRBC 04/17/2022 0.0  0.0 - 0.2 % Final   Performed at [facility], [address], [place], [place] [address]  Magnesium 04/17/2022 1.8  1.7 - 2.4 mg/dL Final   Performed at [facility], [address], [place], [place] [address]   Sodium 04/17/2022 141  135 - 145 mmol/L Final   Potassium 04/17/2022 3.6  3.5 - 5.1 mmol/L Final   Chloride 04/17/2022 113 (H)  98 - 111 mmol/L Final   CO2 04/17/2022 23  22 - 32 mmol/L Final   Glucose, Bld 04/17/2022 96  70 - 99 mg/dL Final   Glucose reference range applies only to samples taken after fasting for at least 8 hours.   BUN 04/17/2022 16  6 - 20 mg/dL Final   Creatinine, Ser 04/17/2022 0.54  0.44 - 1.00 mg/dL Final   Calcium 30/86/5784 8.2 (L)  8.9 - 10.3 mg/dL Final   Total Protein 69/62/9528 5.7 (L)  6.5 - 8.1 g/dL Final   Albumin 41/32/4401 3.2 (L)  3.5 - 5.0 g/dL Final   AST 02/72/5366 23  15 - 41 U/L Final   ALT 04/17/2022 24  0 - 44  U/L Final   Alkaline Phosphatase 04/17/2022 50  38 - 126 U/L Final   Total Bilirubin 04/17/2022 0.4  0.3 - 1.2 mg/dL Final   GFR, Estimated 04/17/2022 >60  >60 mL/min Final   Comment: (NOTE) Calculated using the CKD-EPI Creatinine Equation (2021)    Anion gap 04/17/2022 5  5 - 15 Final   Performed at [facility], [address] [place], [place] [address]   Phosphorus 04/17/2022 3.0  2.5 - 4.6 mg/dL Final   Performed at [facility], [address] [place], [place] [address]   Total CK 04/17/2022 119  38 - 234 U/L Final   Performed at [facility], [address], [place], [place] [address]    Blood Alcohol level:  Lab Results  Component Value Date   ETH <10 10/09/2022   ETH <10 09/12/2022    Metabolic Disorder Labs: Lab Results  Component Value Date   HGBA1C 5.1 08/26/2020   MPG 99.67 08/26/2020   MPG 108 04/20/2010   Lab Results  Component Value Date   PROLACTIN 9.4 03/16/2012   Lab Results  Component Value Date   CHOL 163 04/11/2017   TRIG 63 04/14/2022   HDL 71 04/11/2017   CHOLHDL 2.3 04/11/2017   VLDL 11 04/11/2017   LDLCALC 81 04/11/2017   LDLCALC 139 (H) 12/10/2012    Therapeutic Lab Levels: No results found for: "LITHIUM" Lab Results  Component Value Date   VALPROATE 22.2 (L) 03/19/2012   No results found for: "CBMZ"  Physical Findings   AIMS    Flowsheet Row Ad[facility] (Discharged[facility]  AIMS Total Score 0      AUDIT    Flowsheet Row Admission (Discharged) from [facility]  Alcohol Use Disorder Identification Test Final Score (AUDIT) 26      GAD-7    Flowsheet Row Routine [facility] Routine Prenatal from 07/11/2020 in [facility] Routine Prenatal from 06/05/2020 in [facility] Video Visit from 03/13/2020 in [facility] Initial Prenatal from 02/14/2020 in [facility]  Total GAD-7 Score 11  7 14 0 2      PHQ2-9    Flowsheet Row ED from 10/08/2022 in [facility] Office Visit from 03/12/2021 in [facility] Office Visit from 02/12/2021 in [facility] Office Visit from 12/25/2020 in [facility] Office Visit from 09/24/2020 in [facility]  PHQ-2 Total Score 6 3 1 5 6   PHQ-9 Total Score 15 7 6 14 24       Flowsheet Row ED from 10/08/2022 in [facility] Most recent reading at 10/09/2022 12:55 AM ED from 10/08/2022 in [facility] Most recent reading at 10/08/2022  9:38 PM ED from 10/07/2022 in [facility] Most recent reading at 10/07/2022 10:20 PM  C-SSRS RISK CATEGORY No Risk No Risk No Risk        Musculoskeletal  Strength & Muscle Tone: within normal limits Gait & Station: normal Patient leans: N/A  Psychiatric Specialty Exam  Presentation  General Appearance:  Casual  Eye Contact: Fair  Speech: Clear and Coherent  Speech Volume: Normal  Handedness: Right   Mood and Affect  Mood: Dysphoric; Depressed; Anxious  Affect: Flat   Thought Process  Thought Processes: Coherent  Descriptions of Associations:Intact  Orientation:Full (Time, Place and Person)  Thought Content:WDL  Diagnosis of Schizophrenia or Schizoaffective disorder in past: No    Hallucinations:Hallucinations: None  Ideas of Reference:None  Suicidal Thoughts:Suicidal Thoughts: No  Homicidal Thoughts:Homicidal Thoughts: No   Sensorium   Memory: Immediate Fair  Judgment: Fair  Insight: Fair   Art therapist  Concentration: Fair  Attention Span: Fair  Recall: Fair  Fund of Knowledge: Fair  Language: Fair   Psychomotor Activity  Psychomotor Activity: Psychomotor Activity: Normal   Assets  Assets: Desire for Improvement; Housing; Resilience   Sleep  Sleep: Sleep: Fair Number of Hours of Sleep: 5   Nutritional Assessment (For OBS and [facility] admissions only) Has the patient had a weight loss or gain of 10 pounds or more in the last 3 months?: No Has the patient had a decrease in food intake/or appetite?: Yes Does the patient have dental problems?: No Does the patient have eating habits or behaviors that may be indicators of an eating disorder including binging or inducing vomiting?: No Has the patient recently lost weight without trying?: 0 Has the patient been eating poorly because of a decreased appetite?: 0 Malnutrition Screening Tool Score: 0    Physical Exam  Physical Exam Vitals reviewed.  Constitutional:      Appearance: Normal appearance.  HENT:     Head: Normocephalic and atraumatic.  Cardiovascular:     Rate and Rhythm: Normal rate.  Pulmonary:     Effort: Pulmonary effort is normal.  Neurological:     Mental Status: She is alert.    Review of Systems  Constitutional:  Positive for chills. Negative for fever.  Respiratory:         Yawning  Gastrointestinal:  Positive for nausea.  Psychiatric/Behavioral:  Positive for depression and substance abuse. Negative for hallucinations and suicidal ideas. The patient is nervous/anxious and has insomnia.    Blood pressure 117/67, pulse 71, temperature 98.3 F (36.8 C), temperature source Oral, resp. rate 20, SpO2 100%. There is no height or weight on file to calculate BMI.  Treatment Plan Summary: Opioid Use Disorder -Continue home gabapentin -COWS -Tylenol 650 mg every 6 hours as needed for mild pain -Naproxen 500 mg  BID as needed for pain -Bentyl 20 mg every 6 hours as needed for spasms/abdominal cramping -Robaxin 500 mg every 8 hours as needed for muscle spasms -Zofran 4 mg every 6 hours as needed for nausea or vomiting -Imodium 2 to 4 mg as needed for diarrhea or loose stools -Maalox/Mylanta 30 mL every 4 hours as needed for indigestion -Milk of Mag 30 mL as needed for constipation   Labs pending: HIV, RPR, hepatitis panel  Tobacco use disorder -Start nicotine patch 14 mg daily  Lance Muss, MD 10/09/2022 10:25 AM
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
## 2022-10-09 NOTE — ED Notes (Signed)
Patient is sleeping. Respirations equal and unlabored, skin warm and dry. No change in assessment or acuity. Routine safety checks conducted according to facility protocol. Will continue to monitor for safety.   

## 2022-10-09 NOTE — Progress Notes (Signed)
Pt was visible in the milieu. Pt has flat affect and self-isolated from peers. No signs of acute distress noted or concerns voiced. Staff will monitor for pt's safety.

## 2022-10-09 NOTE — ED Notes (Signed)
Patient admitted through [facility] seeking for substance abuse treatment. On arrival, AO/x4 and MAE. Oriented to the unit and unit rules. Patient calm and cooperative during admission process. Grape juice and salty crackers offered per request. Denies SI, HI &AVH. Will monitor patient for safety.

## 2022-10-09 NOTE — ED Notes (Signed)
Got pt sandwich and chips and juice.

## 2022-10-09 NOTE — Progress Notes (Signed)
Pt's COWS was 9 

## 2022-10-09 NOTE — ED Notes (Signed)
Patient in the bedroom sleeping at the moment. NAD. Respirations are even and unlabored. Will continue to monitor for safety.

## 2022-10-09 NOTE — Progress Notes (Addendum)
Pt is awake, alert and oriented X4. Pt complained of restless legs and abdominal cramp. No signs of acute distress noted. PRN Bentyl, Robaxin and scheduled med administered per order. Pt denies current SI/HI/AVH, plan or intent. Staff will monitor for pt's safety.
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
## 2022-10-10 ENCOUNTER — Encounter (HOSPITAL_COMMUNITY): Payer: Self-pay | Admitting: Psychiatry

## 2022-10-10 DIAGNOSIS — F1911 Other psychoactive substance abuse, in remission: Secondary | ICD-10-CM | POA: Diagnosis not present

## 2022-10-10 DIAGNOSIS — F419 Anxiety disorder, unspecified: Secondary | ICD-10-CM | POA: Diagnosis not present

## 2022-10-10 DIAGNOSIS — F1123 Opioid dependence with withdrawal: Secondary | ICD-10-CM | POA: Diagnosis not present

## 2022-10-10 DIAGNOSIS — F329 Major depressive disorder, single episode, unspecified: Secondary | ICD-10-CM | POA: Diagnosis not present

## 2022-10-10 LAB — POCT URINE DRUG SCREEN - MANUAL ENTRY (I-SCREEN)
POC Amphetamine UR: NOT DETECTED
POC Buprenorphine (BUP): NOT DETECTED
POC Cocaine UR: POSITIVE — AB
POC Marijuana UR: POSITIVE — AB
POC Methadone UR: NOT DETECTED
POC Methamphetamine UR: NOT DETECTED
POC Morphine: NOT DETECTED
POC Oxazepam (BZO): NOT DETECTED
POC Oxycodone UR: NOT DETECTED
POC Secobarbital (BAR): NOT DETECTED

## 2022-10-10 LAB — POC URINE PREG, ED: Preg Test, Ur: NEGATIVE

## 2022-10-10 LAB — RPR: RPR Ser Ql: NONREACTIVE

## 2022-10-10 MED ORDER — NICOTINE POLACRILEX 2 MG MT GUM
2.0000 mg | CHEWING_GUM | OROMUCOSAL | Status: DC | PRN
  Administered 2022-10-10: 2 mg via ORAL
  Filled 2022-10-10: qty 1

## 2022-10-10 MED ORDER — NICOTINE POLACRILEX 2 MG MT GUM: 2.0000 mg | CHEWING_GUM | OROMUCOSAL | 0 refills | Status: DC | PRN

## 2022-10-10 MED ORDER — GABAPENTIN 300 MG PO CAPS
300.0000 mg | ORAL_CAPSULE | Freq: Every day | ORAL | Status: DC
  Administered 2022-10-10: 300 mg via ORAL
  Filled 2022-10-10: qty 1

## 2022-10-10 NOTE — ED Notes (Addendum)
Patient is sleeping. Respirations equal and unlabored, skin warm and dry. No change in assessment or acuity. Routine safety checks conducted according to facility protocol. Will continue to monitor for safety.   

## 2022-10-10 NOTE — Discharge Instructions (Signed)
Patient was accepted to [facility] on Monday 10/13/2022 and would need to be there by 7:00am. Mother reports she will transport her to the facility if seeking further treatment. Patient will also continue with outpatient follow up with her MD at the [facility] where she currently receives suboxone.   [facility] [address] [place], [place], [address] [phone] phone  New Patient Assessment/Therapy Walk-Ins:  Monday and Wednesday: 8 am until slots are full. Every 1st and 2nd Fridays of the month: 1 pm - 5 pm.  NO ASSESSMENT/THERAPY WALK-INS ON TUESDAYS OR THURSDAYS  New Patient Assessment/Medication Management Walk-Ins:  Monday - Friday:  8 am - 11 am.  For all walk-ins, we ask that you arrive by 7:30 am because patients will be seen in the order of arrival.  Availability is limited; therefore, you may not be seen on the same day that you walk-in.  Our goal is to serve and meet the needs of our community to the best of our ability.  SUBSTANCE USE TREATMENT for Medicaid and State Funded/IPRS  [facility] [address], [place], [address] [phone] phone NOTE: [facility] is no longer offering IOP services.  Serves those who are low-income or have no insurance.  [facility] [address] [place], [place], [address] [phone] phone [phone] fax NOTE: Does have Substance Abuse-Intensive Outpatient Program [facility]) as well as transitional housing if eligible.  [facility] [address] [place], [place], [address] [phone] phone [phone] fax  [facility] [phone] [place], [place], [address] [phone] phone [phone] fax  HALFWAY HOUSES:  [facility] [phone]  [employer].oxfordvacancies.com  12 STEP PROGRAMS:  Alcoholics Anonymous of [place] Software[employer]alet.be  Narcotics Anonymous of [place]  HitProtect.dk  Al-Anon of [place], [place] www.[place]alanon.org/find-meetings.html  Nar-Anon https://nar-anon.org/find-a-meetin  List of Residential placements:   [facility] in [place]: [phone]  [facility]: [phone]  [place], [place] [phone]: Female and female facility; 30-day program: (uninsured and Medicaid such as Laurena Bering, [place], [place], partners)  [facility]: [phone]; men and women's facility; 28 days; Can have Medicaid tailored plan Tour manager or Partners)  [facility]: [phone] Karoline Caldwell or Larita Fife; 28 day program; must be fully detox; tailored Medicaid or no insurance  [address] in [place], [place]; [phone]; 28 day all males program; no insurance accepted  [facility] in [place]: Gabriel Rung [phone] (no insurance or Medicaid only); 90 days; outpatient services but provide housing in apartments [place]  [facility] Admission: [phone]: Patient must complete phone screening for placement: [place], [place]; 6 month program; uninsured, Medicaid, and [employer].   [facility]: no insurance required; [phone]  [facility]: [phone]; Intake: Molly Maduro; Must fill out application online; Alecia Lemming Delay [phone] x [address] [facility] in [place], [place]: [phone]; Admissions Coordinators Mr. Maurine Minister or Barron Alvine; 90 day program.  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 Free program; There is an initial $300 fee however, they are willing to work with patients regarding that. Application is online.  [facility]: Admissions [phone] Doran Heater ext [phone]; Any 7-90 day program is out of pocket; 12 month program is free of charge; there is a $275 entry fee; Patient is responsible for own transportation

## 2022-10-10 NOTE — ED Provider Notes (Signed)
FBC/OBS ASAP Discharge Summary  Date and Time: 10/10/2022 3:19 PM  Name: [name]  MRN:  [id number]   Discharge Diagnoses:  Final diagnoses:  Polysubstance abuse (HCC)  Opioid withdrawal (HCC)  Anxious appearance    Subjective: [name], 27 y/o female with a history of opioid use disorder (heroine), presented to [facility] for rehab from heroin abuse. Patient was at [facility] yesterday but left AMA and then returned to [facility].   Patient seen this morning. Regarding withdrawal symptoms, she notes feeling anxious. We discussed at length changes in medication regimen and agreed to increase her gabapentin dose. I recommended seeing an outpatient psychiatrist and therapist in the future to aid with her anxiety connected with her substance use. She denies SI, HI, and AVH.   Spoke with patient later and she requests discharge. I discussed how she is accepted to [facility] next week and she continues to prefer going home with her mother. I also gave the option of starting subutex bridge and she stated she wants to return home. She states that she will not use substances while she is at home.   Stay Summary: The patient was evaluated each day by a clinical provider to ascertain response to treatment. Improvement was noted by the patient's report of decreasing symptoms, improved sleep and appetite, affect, medication tolerance, behavior, and participation in unit programming.  Patient was asked each day to complete a self inventory noting mood, mental status, pain, new symptoms, anxiety and concerns.  The patient's medications were managed with the following directions: -continued home gabapentin and increased to 600 mg AM  300 mg noon  600 mg PM  Patient responded well to medication and being in a therapeutic and supportive environment. The patient worked closely with the treatment team and case manager to develop a discharge plan with appropriate goals. Coping skills, problem solving as well as relaxation  therapies were also part of the unit programming.   Total Time spent with patient: 30 minutes  Past Psychiatric History:  Dx: opioid use disorder, MDD, and anxiety Currently not seeing a psychiatrist or therapist. Currently not taking any prescribed medication.  Took Suboxone was in June 2024  Past Medical History:      Past Medical History:  Diagnosis Date   ADHD (attention deficit hyperactivity disorder)     Anxiety     Cholecystitis     Depression     Dysmenorrhea     Endometriosis     GERD (gastroesophageal reflux disease)     Headache      migraines   Hyperlipidemia     Hypoglycemia     Polysubstance abuse (HCC)     Pregnancy complicated by subutex maintenance, antepartum (HCC)     Syncope      Family History:   Anesthesia problems Maternal Grandfather     Heart disease Maternal Grandfather     Nephrolithiasis Maternal Grandfather     Diabetes Maternal Grandfather     Mental illness Maternal Grandfather     Cholelithiasis Mother     Nephrolithiasis Mother     Depression Mother     Hypertension Mother     Miscarriages / Stillbirths Mother     Anxiety disorder Mother     Gout Father     Nephrolithiasis Maternal Grandmother     COPD Maternal Grandmother     Heart disease Paternal Grandfather     Cholelithiasis Maternal Aunt     Depression Maternal Aunt     Learning disabilities Maternal  Aunt     Bipolar disorder Sister      Social History: she lives alone, works at [employer], has two year old child   Current Medications:  Current Facility-Administered Medications  Medication Dose Route Frequency Provider Last Rate Last Admin   acetaminophen (TYLENOL) tablet 650 mg  650 mg Oral Q6H PRN Sindy Guadeloupe, NP       alum & mag hydroxide-simeth (MAALOX/MYLANTA) 200-200-20 MG/5ML suspension 30 mL  30 mL Oral Q4H PRN Sindy Guadeloupe, NP       dicyclomine (BENTYL) tablet 20 mg  20 mg Oral Q6H PRN Sindy Guadeloupe, NP   20 mg at 10/10/22 0745   gabapentin (NEURONTIN)  capsule 300 mg  300 mg Oral Daily Kizzie Ide B, MD   300 mg at 10/10/22 1511   gabapentin (NEURONTIN) capsule 600 mg  600 mg Oral Q12H Kizzie Ide B, MD   600 mg at 10/10/22 0933   hydrOXYzine (ATARAX) tablet 25 mg  25 mg Oral Q6H PRN Sindy Guadeloupe, NP   25 mg at 10/10/22 1610   loperamide (IMODIUM) capsule 2-4 mg  2-4 mg Oral PRN Sindy Guadeloupe, NP       OLANZapine zydis (ZYPREXA) disintegrating tablet 10 mg  10 mg Oral Q8H PRN Sindy Guadeloupe, NP       And   LORazepam (ATIVAN) tablet 1 mg  1 mg Oral PRN Sindy Guadeloupe, NP       And   ziprasidone (GEODON) injection 20 mg  20 mg Intramuscular PRN Sindy Guadeloupe, NP       magnesium hydroxide (MILK OF MAGNESIA) suspension 30 mL  30 mL Oral Daily PRN Sindy Guadeloupe, NP       methocarbamol (ROBAXIN) tablet 500 mg  500 mg Oral Q8H PRN Sindy Guadeloupe, NP   500 mg at 10/10/22 0701   naproxen (NAPROSYN) tablet 500 mg  500 mg Oral BID PRN Sindy Guadeloupe, NP   500 mg at 10/10/22 9604   nicotine polacrilex (NICORETTE) gum 2 mg  2 mg Oral PRN Kizzie Ide B, MD   2 mg at 10/10/22 1131   ondansetron (ZOFRAN-ODT) disintegrating tablet 4 mg  4 mg Oral Q6H PRN Sindy Guadeloupe, NP       traZODone (DESYREL) tablet 50 mg  50 mg Oral QHS PRN Kizzie Ide B, MD   50 mg at 10/09/22 2114   Current Outpatient Medications  Medication Sig Dispense Refill   hydrOXYzine (ATARAX) 25 MG tablet Take 1 tablet (25 mg total) by mouth every 6 (six) hours. (Patient taking differently: Take 25 mg by mouth every 6 (six) hours as needed for anxiety.) 12 tablet 0   ibuprofen (ADVIL) 800 MG tablet Take 800 mg by mouth every 8 (eight) hours as needed (for pain).     methocarbamol (ROBAXIN) 500 MG tablet Take 1 tablet (500 mg total) by mouth every 8 (eight) hours as needed for muscle spasms. 15 tablet 0   naproxen (NAPROSYN) 375 MG tablet Take 1 tablet (375 mg total) by mouth 2 (two) times daily. (Patient taking differently: Take 375 mg by mouth 2 (two) times daily as needed (For  pain).) 20 tablet 0   ondansetron (ZOFRAN-ODT) 4 MG disintegrating tablet Take 1 tablet (4 mg total) by mouth every 8 (eight) hours as needed for nausea or vomiting. 10 tablet 0   gabapentin (NEURONTIN) 600 MG tablet Take 600 mg by mouth every 12 (twelve) hours.      PTA Medications:  Facility Ordered Medications  Medication  acetaminophen (TYLENOL) tablet 650 mg   alum & mag hydroxide-simeth (MAALOX/MYLANTA) 200-200-20 MG/5ML suspension 30 mL   magnesium hydroxide (MILK OF MAGNESIA) suspension 30 mL   dicyclomine (BENTYL) tablet 20 mg   hydrOXYzine (ATARAX) tablet 25 mg   loperamide (IMODIUM) capsule 2-4 mg   methocarbamol (ROBAXIN) tablet 500 mg   naproxen (NAPROSYN) tablet 500 mg   ondansetron (ZOFRAN-ODT) disintegrating tablet 4 mg   OLANZapine zydis (ZYPREXA) disintegrating tablet 10 mg   And   LORazepam (ATIVAN) tablet 1 mg   And   ziprasidone (GEODON) injection 20 mg   traZODone (DESYREL) tablet 50 mg   gabapentin (NEURONTIN) capsule 600 mg   gabapentin (NEURONTIN) capsule 300 mg   nicotine polacrilex (NICORETTE) gum 2 mg   PTA Medications  Medication Sig   ibuprofen (ADVIL) 800 MG tablet Take 800 mg by mouth every 8 (eight) hours as needed (for pain).   hydrOXYzine (ATARAX) 25 MG tablet Take 1 tablet (25 mg total) by mouth every 6 (six) hours. (Patient taking differently: Take 25 mg by mouth every 6 (six) hours as needed for anxiety.)   methocarbamol (ROBAXIN) 500 MG tablet Take 1 tablet (500 mg total) by mouth every 8 (eight) hours as needed for muscle spasms.   naproxen (NAPROSYN) 375 MG tablet Take 1 tablet (375 mg total) by mouth 2 (two) times daily. (Patient taking differently: Take 375 mg by mouth 2 (two) times daily as needed (For pain).)   ondansetron (ZOFRAN-ODT) 4 MG disintegrating tablet Take 1 tablet (4 mg total) by mouth every 8 (eight) hours as needed for nausea or vomiting.   gabapentin (NEURONTIN) 600 MG tablet Take 600 mg by mouth every 12 (twelve) hours.        10/08/2022   11:35 PM 03/12/2021   11:58 AM 02/12/2021   11:10 AM  Depression screen PHQ 2/9  Decreased Interest 3 1 0  Down, Depressed, Hopeless 3 2 1   PHQ - 2 Score 6 3 1   Altered sleeping 3 1 2   Tired, decreased energy 3 1 1   Change in appetite 3 2 2   Feeling bad or failure about yourself  0 0 0  Trouble concentrating 0 0 0  Moving slowly or fidgety/restless 0 0 0  Suicidal thoughts 0 0 0  PHQ-9 Score 15 7 6   Difficult doing work/chores  Not difficult at all Not difficult at all    [facility] ED from 10/08/2022 in [facility] Most recent reading at 10/09/2022 12:55 AM ED from 10/08/2022 in [facility] Most recent reading at 10/08/2022  9:38 PM ED from 10/07/2022 in [facility] Most recent reading at 10/07/2022 10:20 PM  C-SSRS RISK CATEGORY No Risk No Risk No Risk       Musculoskeletal  Strength & Muscle Tone: within normal limits Gait & Station: normal Patient leans: N/A  Psychiatric Specialty Exam  Presentation  General Appearance:  Casual; Disheveled  Eye Contact: Fair  Speech: Clear and Coherent  Speech Volume: Normal  Handedness: Right   Mood and Affect  Mood: Depressed  Affect: Congruent; Tearful   Thought Process  Thought Processes: Coherent  Descriptions of Associations:Intact  Orientation:Full (Time, Place and Person)  Thought Content:Logical  Diagnosis of Schizophrenia or Schizoaffective disorder in past: No    Hallucinations:Hallucinations: None  Ideas of Reference:None  Suicidal Thoughts:Suicidal Thoughts: No  Homicidal Thoughts:Homicidal Thoughts: No   Sensorium  Memory: Immediate Good; Recent Good; Remote Good  Judgment: Poor  Insight: Fair   Chartered certified accountant: Good  Attention Span: Good  Recall: Dudley Major of Knowledge: Good  Language: Good   Psychomotor Activity  Psychomotor  Activity: Psychomotor Activity: Normal   Assets  Assets: Desire for Improvement; Resilience   Sleep  Sleep: Sleep: Poor    Physical Exam  Physical Exam Vitals reviewed.  Constitutional:      Appearance: Normal appearance.  HENT:     Head: Normocephalic and atraumatic.  Cardiovascular:     Rate and Rhythm: Normal rate.  Pulmonary:     Effort: Pulmonary effort is normal.  Neurological:     Mental Status: She is alert.    Review of Systems  Constitutional:  Negative for chills and fever.  Cardiovascular:  Negative for chest pain and palpitations.  Gastrointestinal:  Negative for nausea and vomiting.  Psychiatric/Behavioral:  Positive for substance abuse. Negative for suicidal ideas. The patient is nervous/anxious.    Blood pressure 122/63, pulse 78, temperature 98.2 F (36.8 C), temperature source Oral, resp. rate 18, SpO2 99%. There is no height or weight on file to calculate BMI.  Demographic Factors:  Adolescent or young adult, Low socioeconomic status, and Unemployed  Loss Factors: Loss of significant relationship  Historical Factors: Impulsivity  Risk Reduction Factors:   Sense of responsibility to family and Positive social support  Continued Clinical Symptoms:  Alcohol/Substance Abuse/Dependencies  Cognitive Features That Contribute To Risk:  Closed-mindedness    Suicide Risk:  Mild:  Suicidal ideation of limited frequency, intensity, duration, and specificity.  There are no identifiable plans, no associated intent, mild dysphoria and related symptoms, good self-control (both objective and subjective assessment), few other risk factors, and identifiable protective factors, including available and accessible social support.  Plan Of Care/Follow-up recommendations:  Activity as tolerated Regular diet Continue prescription medications See PCP for medical conditions   Disposition: home with mother (CSW confirmed with mother)  Lance Muss,  MD 10/10/2022, 3:19 PM
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
## 2022-10-10 NOTE — ED Notes (Signed)
Patient awake and alert at this time.  She received bentyl in addition to the medications she received at start of shift.  Patient having mild opiate withdrawal symptoms.  She is calm, cooperative anxious and sullen.  Patient makes needs known appropriately.  She made several phone calls and is now waiting for breakfast.  Will monitor.
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
## 2022-11-18 ENCOUNTER — Ambulatory Visit (HOSPITAL_COMMUNITY)
Admission: EM | Admit: 2022-11-18 | Discharge: 2022-11-19 | Disposition: A | Discharge status: Other Acute Inpatient Hospital | Payer: Medicaid Other | Attending: Nurse Practitioner | Admitting: Nurse Practitioner

## 2022-11-18 DIAGNOSIS — F419 Anxiety disorder, unspecified: Secondary | ICD-10-CM

## 2022-11-18 DIAGNOSIS — Z560 Unemployment, unspecified: Secondary | ICD-10-CM | POA: Insufficient documentation

## 2022-11-18 DIAGNOSIS — F411 Generalized anxiety disorder: Secondary | ICD-10-CM | POA: Insufficient documentation

## 2022-11-18 DIAGNOSIS — F32A Depression, unspecified: Secondary | ICD-10-CM | POA: Insufficient documentation

## 2022-11-18 DIAGNOSIS — F1910 Other psychoactive substance abuse, uncomplicated: Secondary | ICD-10-CM

## 2022-11-18 DIAGNOSIS — Z046 Encounter for general psychiatric examination, requested by authority: Secondary | ICD-10-CM

## 2022-11-18 HISTORY — DX: Encounter for general psychiatric examination, requested by authority: Z04.6

## 2022-11-18 LAB — POC URINE PREG, ED: Preg Test, Ur: NEGATIVE

## 2022-11-18 LAB — POCT URINE DRUG SCREEN - MANUAL ENTRY (I-SCREEN)
POC Amphetamine UR: NOT DETECTED
POC Buprenorphine (BUP): NOT DETECTED
POC Cocaine UR: NOT DETECTED
POC Marijuana UR: NOT DETECTED
POC Methadone UR: NOT DETECTED
POC Methamphetamine UR: NOT DETECTED
POC Morphine: NOT DETECTED
POC Oxazepam (BZO): POSITIVE — AB
POC Oxycodone UR: NOT DETECTED
POC Secobarbital (BAR): NOT DETECTED

## 2022-11-18 MED ORDER — ONDANSETRON 4 MG PO TBDP: 4.0000 mg | ORAL_TABLET | Freq: Four times a day (QID) | ORAL | Status: DC | PRN

## 2022-11-18 MED ORDER — ACETAMINOPHEN 325 MG PO TABS: 650.0000 mg | ORAL_TABLET | Freq: Four times a day (QID) | ORAL | Status: DC | PRN

## 2022-11-18 MED ORDER — LOPERAMIDE HCL 2 MG PO CAPS: 2.0000 mg | ORAL_CAPSULE | ORAL | Status: DC | PRN

## 2022-11-18 MED ORDER — MAGNESIUM HYDROXIDE 400 MG/5ML PO SUSP: 30.0000 mL | Freq: Every day | ORAL | Status: DC | PRN

## 2022-11-18 MED ORDER — DICYCLOMINE HCL 20 MG PO TABS: 20.0000 mg | ORAL_TABLET | Freq: Four times a day (QID) | ORAL | Status: DC | PRN

## 2022-11-18 MED ORDER — NAPROXEN 500 MG PO TABS: 500.0000 mg | ORAL_TABLET | Freq: Two times a day (BID) | ORAL | Status: DC | PRN

## 2022-11-18 MED ORDER — HYDROXYZINE HCL 25 MG PO TABS: 25.0000 mg | ORAL_TABLET | Freq: Four times a day (QID) | ORAL | Status: DC | PRN

## 2022-11-18 MED ORDER — METHOCARBAMOL 500 MG PO TABS: 500.0000 mg | ORAL_TABLET | Freq: Three times a day (TID) | ORAL | Status: DC | PRN

## 2022-11-18 MED ORDER — ALUM & MAG HYDROXIDE-SIMETH 200-200-20 MG/5ML PO SUSP: 30.0000 mL | ORAL | Status: DC | PRN

## 2022-11-18 NOTE — ED Notes (Signed)
Patients mother called wanting to speak with provider in reference to patients condition. This nurse advised the mother that the providers are seeing other patients at this time and because the patient is an adult with out consent they would not be able to disclose any information to her. This nurse advised that I can give the patient her number and have the patient to give her a call. The mothers phone number is [phone], however the patient is sleeping.

## 2022-11-18 NOTE — ED Notes (Signed)
Pt adm under IVC .  Labs and UDS obtained.  Pt was searched with no contraband found.  She was oriented to milieu and offered food and something to drink.  Pt and then went to sleep without incident. Breathing even and unlabored. Pt is in view of nursing station.   No distress noted.

## 2022-11-18 NOTE — Progress Notes (Signed)
   11/18/22 1920  [facility] Triage Screening (Walk-ins at [facility] only)  How Did You Hear About [place]? Legal System  What Is the Reason for Your Visit/Call Today? Pt presents to [facility] under IVC, accompanied by GPD due to depression and substance use. Pt states " I don't need to be here, my mom took out paperwork because she saw me take klonopin". Per IVC-"Respondent has been diagnosed with depression. Paulina is currently having an episode which caused an influx in drug use. She recently OD on Sunday and refused to be transported to the hospital. Respondent has stated several times that she wants to die. She is using heroine, Xanax and Fentanyl". Pt currently denies SI,HI,AVH.  How Long Has This Been Causing You Problems? <Week  Have You Recently Had Any Thoughts About Hurting Yourself? No  Are You Planning to Commit Suicide/Harm Yourself At This time? No  Have you Recently Had Thoughts About Hurting Someone Karolee Ohs? No  Are You Planning To Harm Someone At This Time? No  Explanation: Pt denies  Are you currently experiencing any auditory, visual or other hallucinations? No  Have You Used Any Alcohol or Drugs in the Past 24 Hours? Yes  How long ago did you use Drugs or Alcohol? earlier today  What Did You Use and How Much? Klonopin (1 pill)  Do you have any current medical co-morbidities that require immediate attention? No  Clinician description of patient physical appearance/behavior: appears under the influence, very slow-like reflexes  What Do You Feel Would Help You the Most Today? Alcohol or Drug Use Treatment;Treatment for Depression or other mood problem  If access to [facility] was not available, would you have sought care in the Emergency Department? No  Determination of Need Urgent (48 hours)  Options For Referral Other: Comment;Chemical Dependency Intensive Outpatient Therapy (CDIOP);Facility-Based Crisis
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
## 2022-11-18 NOTE — ED Notes (Addendum)
This nurse reached out to the provider to make her aware of the patients request for sleep medication. However per [place] NP, the patient has consumed klonopin and Xanax therefore giving her another sedative would be unsafe.

## 2022-11-18 NOTE — ED Notes (Signed)
Patient is asking for medication to help her sleep.

## 2022-11-18 NOTE — ED Provider Notes (Signed)
[facility] Continuous Assessment Admission H&P  Date: 11/18/22 Patient Name: [name] MRN: [id number] Chief Complaint: " My grandmom had me committed".  Diagnoses:  Final diagnoses:  Involuntary commitment  Substance abuse (HCC)  Anxiety    HPI: [name] is a 27 year old female with psychiatric history of ADHD, OUD, Opioid withdrawals, Polysubstance abuse, anxiety,and Depression ,who presented to [facility] via [facility] under IVC.  Per IVC Petition:  Respondent has been diagnosed with depression. [name] is currently having an episode which caused an influx in drug use. She recently OD on Sunday and refused to be transported to the hospital. Respondent has stated several times that she wants to die.  She is using Heroin, Xanax, and Fentanyl.   Patient was seen face to face by this provider and chart reviewed. Per chart review, patient has had 6 ED visits in the last 6 months. Patient was recently admitted to the [facility] for substance abuse treatment, but left AMA.   On evaluation, patient is alert, oriented x 4, and cooperative. Speech is clear, slow and coherent. Pt appears casually dressed. Eye contact is fair. Mood is depressed and anxious, affect is congruent with mood. Thought process is coherent and thought content is WDL. Pt denies SI/HI/AVH. There is no objective indication that the patient is responding to internal stimuli. No delusions elicited during this assessment.    Patient reports " My mom had me committed against my will because she feel like I'm a danger to myself, saying I was on drugs because she saw me take a klonopin, just one pill, but I don't feel so, this is not the first time she's done this and I feel like it's totally uncalled for".   Patient categorically denies all the claims in the [facility] Petition, and denies being a danger to herself or others.She reports living alone and denies access to a gun or weapon. She also denies illicit substance use.   Patient  reports she is not established with outpatient psychiatric service and states " I don't have a problem".   Support, encouragement, reassurance provided about ongoing stressors.  Patient is provided with opportunity for questions.  Discussed recommendation for admission to the continuous observation unit for safety monitoring pending collateral from petitioner. [facility] staff attempted to reach the petitioner by phone, but was unsuccessful.  Recommend re-eval with collateral in the am.   Total Time spent with patient: 20 minutes  Musculoskeletal  Strength & Muscle Tone: within normal limits Gait & Station: normal Patient leans: N/A  Psychiatric Specialty Exam  Presentation General Appearance:  Casual  Eye Contact: Fair  Speech: Slow; Clear and Coherent  Speech Volume: Normal  Handedness: Right   Mood and Affect  Mood: Depressed  Affect: Congruent   Thought Process  Thought Processes: Coherent  Descriptions of Associations:Intact  Orientation:Full (Time, Place and Person)  Thought Content:WDL  Diagnosis of Schizophrenia or Schizoaffective disorder in past: No   Hallucinations:Hallucinations: None  Ideas of Reference:None  Suicidal Thoughts:Suicidal Thoughts: No  Homicidal Thoughts:Homicidal Thoughts: No   Sensorium  Memory: Immediate Fair  Judgment: Poor  Insight: Poor   Executive Functions  Concentration: Fair  Attention Span: Fair  Recall: Fair  Fund of Knowledge: Fair  Language: Fair   Psychomotor Activity  Psychomotor Activity: Psychomotor Activity: Normal   Assets  Assets: Communication Skills; Desire for Improvement   Sleep  Sleep: Sleep: Fair   Nutritional Assessment (For OBS and FBC admissions only) Has the patient had a weight  loss or gain of 10 pounds or more in the last 3 months?: No Has the patient had a decrease in food intake/or appetite?: No Does the patient have dental problems?: No Does the patient  have eating habits or behaviors that may be indicators of an eating disorder including binging or inducing vomiting?: No Has the patient recently lost weight without trying?: 0 Has the patient been eating poorly because of a decreased appetite?: 0 Malnutrition Screening Tool Score: 0    Physical Exam Constitutional:      General: She is not in acute distress.    Appearance: She is not diaphoretic.  HENT:     Head: Normocephalic.     Right Ear: External ear normal.     Left Ear: External ear normal.     Nose: No congestion.  Eyes:     General:        Right eye: No discharge.        Left eye: No discharge.  Cardiovascular:     Rate and Rhythm: Normal rate.  Pulmonary:     Effort: No respiratory distress.  Chest:     Chest wall: No tenderness.  Neurological:     Mental Status: She is alert and oriented to person, place, and time.  Psychiatric:        Attention and Perception: Attention and perception normal.        Mood and Affect: Mood is anxious.        Speech: Speech normal.        Behavior: Behavior is cooperative.        Thought Content: Thought content normal.        Cognition and Memory: Cognition and memory normal.    Review of Systems  Constitutional:  Negative for chills, diaphoresis and fever.  HENT:  Negative for congestion.   Eyes:  Negative for discharge.  Respiratory:  Negative for cough, shortness of breath and wheezing.   Cardiovascular:  Negative for chest pain and palpitations.  Gastrointestinal:  Negative for diarrhea, nausea and vomiting.  Neurological:  Negative for dizziness, seizures, loss of consciousness and headaches.  Psychiatric/Behavioral:  Positive for substance abuse.     Blood pressure 118/69, pulse (!) 111, temperature 98.5 F (36.9 C), temperature source Oral, resp. rate 20, SpO2 98%. There is no height or weight on file to calculate BMI.  Past Psychiatric History: See H & P   Is the patient at risk to self?  Denies Has the patient  been a risk to self in the past 6 months? Yes .    Has the patient been a risk to self within the distant past? Yes   Is the patient a risk to others? No   Has the patient been a risk to others in the past 6 months? No   Has the patient been a risk to others within the distant past?  Unknown  Past Medical History: See Chart  Family History: N/A  Social History: N/A  Last Labs:  Admission on 11/18/2022  Component Date Value Ref Range Status   Preg Test, Ur 11/18/2022 Negative  Negative Final   POC Amphetamine UR 11/18/2022 None Detected  NONE DETECTED (Cut Off Level 1000 ng/mL) Final   POC Secobarbital (BAR) 11/18/2022 None Detected  NONE DETECTED (Cut Off Level 300 ng/mL) Final   POC Buprenorphine (BUP) 11/18/2022 None Detected  NONE DETECTED (Cut Off Level 10 ng/mL) Final   POC Oxazepam (BZO) 11/18/2022 Positive (A)  NONE DETECTED (Cut Off  Level 300 ng/mL) Final   POC Cocaine UR 11/18/2022 None Detected  NONE DETECTED (Cut Off Level 300 ng/mL) Final   POC Methamphetamine UR 11/18/2022 None Detected  NONE DETECTED (Cut Off Level 1000 ng/mL) Final   POC Morphine 11/18/2022 None Detected  NONE DETECTED (Cut Off Level 300 ng/mL) Final   POC Methadone UR 11/18/2022 None Detected  NONE DETECTED (Cut Off Level 300 ng/mL) Final   POC Oxycodone UR 11/18/2022 None Detected  NONE DETECTED (Cut Off Level 100 ng/mL) Final   POC Marijuana UR 11/18/2022 None Detected  NONE DETECTED (Cut Off Level 50 ng/mL) Final  Admission on 10/08/2022, Discharged on 10/10/2022  Component Date Value Ref Range Status   WBC 10/09/2022 3.6 (L)  4.0 - 10.5 K/uL Final   RBC 10/09/2022 4.83  3.87 - 5.11 MIL/uL Final   Hemoglobin 10/09/2022 12.4  12.0 - 15.0 g/dL Final   HCT 54/09/8117 39.2  36.0 - 46.0 % Final   MCV 10/09/2022 81.2  80.0 - 100.0 fL Final   MCH 10/09/2022 25.7 (L)  26.0 - 34.0 pg Final   MCHC 10/09/2022 31.6  30.0 - 36.0 g/dL Final   RDW 14/78/2956 14.3  11.5 - 15.5 % Final   Platelets 10/09/2022 190   150 - 400 K/uL Final   nRBC 10/09/2022 0.0  0.0 - 0.2 % Final   Neutrophils Relative % 10/09/2022 43  % Final   Neutro Abs 10/09/2022 1.5 (L)  1.7 - 7.7 K/uL Final   Lymphocytes Relative 10/09/2022 45  % Final   Lymphs Abs 10/09/2022 1.6  0.7 - 4.0 K/uL Final   Monocytes Relative 10/09/2022 7  % Final   Monocytes Absolute 10/09/2022 0.2  0.1 - 1.0 K/uL Final   Eosinophils Relative 10/09/2022 4  % Final   Eosinophils Absolute 10/09/2022 0.1  0.0 - 0.5 K/uL Final   Basophils Relative 10/09/2022 1  % Final   Basophils Absolute 10/09/2022 0.0  0.0 - 0.1 K/uL Final   Immature Granulocytes 10/09/2022 0  % Final   Abs Immature Granulocytes 10/09/2022 0.00  0.00 - 0.07 K/uL Final   Performed at [facility], [address], [place], [place] [address]   Sodium 10/09/2022 141  135 - 145 mmol/L Final   Potassium 10/09/2022 4.6  3.5 - 5.1 mmol/L Final   Chloride 10/09/2022 101  98 - 111 mmol/L Final   CO2 10/09/2022 27  22 - 32 mmol/L Final   Glucose, Bld 10/09/2022 80  70 - 99 mg/dL Final   Glucose reference range applies only to samples taken after fasting for at least 8 hours.   BUN 10/09/2022 8  6 - 20 mg/dL Final   Creatinine, Ser 10/09/2022 0.70  0.44 - 1.00 mg/dL Final   Calcium 65/78/4696 9.3  8.9 - 10.3 mg/dL Final   Total Protein 29/52/8413 6.0 (L)  6.5 - 8.1 g/dL Final   Albumin 24/40/1027 4.0  3.5 - 5.0 g/dL Final   AST 25/36/6440 16  15 - 41 U/L Final   ALT 10/09/2022 14  0 - 44 U/L Final   Alkaline Phosphatase 10/09/2022 48  38 - 126 U/L Final   Total Bilirubin 10/09/2022 0.8  0.3 - 1.2 mg/dL Final   GFR, Estimated 10/09/2022 >60  >60 mL/min Final   Comment: (NOTE) Calculated using the CKD-EPI Creatinine Equation (2021)    Anion gap 10/09/2022 13  5 - 15 Final   Performed at [facility], [address] [place], [place]  [address]   Alcohol, Ethyl (B) 10/09/2022 <10  <10 mg/dL Final   Comment: (NOTE) Lowest detectable limit for serum alcohol is 10 mg/dL.  For  medical purposes only. Performed at [facility], [address], [place], [place] [address]    TSH 10/09/2022 0.553  0.350 - 4.500 uIU/mL Final   Comment: Performed by a 3rd Generation assay with a functional sensitivity of <=0.01 uIU/mL. Performed at [facility], [address], [place], [place] [address]    Preg Test, Ur 10/10/2022 Negative  Negative Final   POC Amphetamine UR 10/10/2022 None Detected  NONE DETECTED (Cut Off Level 1000 ng/mL) Final   POC Secobarbital (BAR) 10/10/2022 None Detected  NONE DETECTED (Cut Off Level 300 ng/mL) Final   POC Buprenorphine (BUP) 10/10/2022 None Detected  NONE DETECTED (Cut Off Level 10 ng/mL) Final   POC Oxazepam (BZO) 10/10/2022 None Detected  NONE DETECTED (Cut Off Level 300 ng/mL) Final   POC Cocaine UR 10/10/2022 Positive (A)  NONE DETECTED (Cut Off Level 300 ng/mL) Final   POC Methamphetamine UR 10/10/2022 None Detected  NONE DETECTED (Cut Off Level 1000 ng/mL) Final   POC Morphine 10/10/2022 None Detected  NONE DETECTED (Cut Off Level 300 ng/mL) Final   POC Methadone UR 10/10/2022 None Detected  NONE DETECTED (Cut Off Level 300 ng/mL) Final   POC Oxycodone UR 10/10/2022 None Detected  NONE DETECTED (Cut Off Level 100 ng/mL) Final   POC Marijuana UR 10/10/2022 Positive (A)  NONE DETECTED (Cut Off Level 50 ng/mL) Final   HIV Screen 4th Generation wRfx 10/09/2022 Non Reactive  Non Reactive Final   Performed at [facility], [address] [place], [place] [address]   Hepatitis B Surface Ag 10/09/2022 NON REACTIVE  NON REACTIVE Final   HCV Ab 10/09/2022 NON REACTIVE  NON REACTIVE Final   Comment: (NOTE) Nonreactive HCV antibody screen is consistent with no HCV infections,  unless recent infection is suspected or other evidence exists to indicate HCV infection.     Hep A IgM 10/09/2022 NON REACTIVE  NON REACTIVE Final   Hep B C IgM 10/09/2022 NON REACTIVE  NON REACTIVE Final   Performed at [facility], [address], [place], [place] [address]   RPR Ser Ql 10/09/2022 NON REACTIVE  NON REACTIVE Final   Performed at [facility], [address], [place], [place] [address]  Admission on 09/16/2022, Discharged on 09/17/2022  Component Date Value Ref Range Status   WBC 09/16/2022 4.8  4.0 - 10.5 K/uL Final   RBC 09/16/2022 4.76  3.87 - 5.11 MIL/uL Final   Hemoglobin 09/16/2022 12.0  12.0 - 15.0 g/dL Final   HCT 95/28/4132 39.3  36.0 - 46.0 % Final   MCV 09/16/2022 82.6  80.0 - 100.0 fL Final   MCH 09/16/2022 25.2 (L)  26.0 - 34.0 pg Final   MCHC 09/16/2022 30.5  30.0 - 36.0 g/dL Final   RDW 44/01/270 14.3  11.5 - 15.5 % Final   Platelets 09/16/2022 222  150 - 400 K/uL Final   nRBC 09/16/2022 0.0  0.0 - 0.2 % Final   Neutrophils Relative % 09/16/2022 47  % Final   Neutro Abs 09/16/2022 2.2  1.7 - 7.7 K/uL Final   Lymphocytes Relative 09/16/2022 44  % Final   Lymphs Abs 09/16/2022 2.1  0.7 - 4.0 K/uL Final   Monocytes Relative 09/16/2022 6  % Final   Monocytes Absolute 09/16/2022 0.3  0.1 - 1.0 K/uL Final   Eosinophils Relative 09/16/2022 2  % Final   Eosinophils Absolute 09/16/2022 0.1  0.0 - 0.5 K/uL Final   Basophils Relative 09/16/2022 1  % Final   Basophils Absolute 09/16/2022 0.0  0.0 - 0.1 K/uL Final   Immature Granulocytes 09/16/2022 0  % Final   Abs Immature Granulocytes 09/16/2022 0.01  0.00 - 0.07 K/uL Final   Performed at [facility], [address] [place], [place] [address]   Sodium 09/16/2022 140  135 - 145 mmol/L Final   Potassium 09/16/2022 3.4 (L)  3.5 - 5.1 mmol/L Final   Chloride 09/16/2022 102  98 - 111 mmol/L Final   CO2 09/16/2022 29  22 - 32 mmol/L Final   Glucose, Bld 09/16/2022 115 (H)  70 - 99 mg/dL Final   Glucose reference range applies only to samples taken after fasting for at least 8 hours.   BUN 09/16/2022 25 (H)  6 - 20 mg/dL Final   Creatinine, Ser 09/16/2022 0.90  0.44 - 1.00 mg/dL Final   Calcium 60/45/4098 9.0  8.9 - 10.3 mg/dL Final   Total  Protein 09/16/2022 6.4 (L)  6.5 - 8.1 g/dL Final   Albumin 11/91/4782 4.1  3.5 - 5.0 g/dL Final   AST 95/62/1308 15  15 - 41 U/L Final   ALT 09/16/2022 15  0 - 44 U/L Final   Alkaline Phosphatase 09/16/2022 43  38 - 126 U/L Final   Total Bilirubin 09/16/2022 0.4  0.3 - 1.2 mg/dL Final   GFR, Estimated 09/16/2022 >60  >60 mL/min Final   Comment: (NOTE) Calculated using the CKD-EPI Creatinine Equation (2021)    Anion gap 09/16/2022 9  5 - 15 Final   Performed at [facility], [address], [place], [place] [address]   Opiates 09/16/2022 POSITIVE (A)  NONE DETECTED Final   Cocaine 09/16/2022 POSITIVE (A)  NONE DETECTED Final   Benzodiazepines 09/16/2022 POSITIVE (A)  NONE DETECTED Final   Amphetamines 09/16/2022 NONE DETECTED  NONE DETECTED Final   Tetrahydrocannabinol 09/16/2022 NONE DETECTED  NONE DETECTED Final   Barbiturates 09/16/2022 NONE DETECTED  NONE DETECTED Final   Comment: (NOTE) DRUG SCREEN FOR MEDICAL PURPOSES ONLY.  IF CONFIRMATION IS NEEDED FOR ANY PURPOSE, NOTIFY LAB WITHIN 5 DAYS.  LOWEST DETECTABLE LIMITS FOR URINE DRUG SCREEN Drug Class                     Cutoff (ng/mL) Amphetamine and metabolites    1000 Barbiturate and metabolites    200 Benzodiazepine                 200 Opiates and metabolites        300 Cocaine and metabolites        300 THC                            50 Performed at [facility], [address], [place], [place] [address]    Preg, Serum 09/16/2022 NEGATIVE  NEGATIVE Final   Comment:        THE SENSITIVITY OF THIS METHODOLOGY IS >10 mIU/mL. Performed at [facility], [address] [place], [place] [address]   Admission on 09/12/2022, Discharged on 09/13/2022  Component Date Value Ref Range Status   Sodium 09/12/2022 138  135 - 145 mmol/L Final   Potassium 09/12/2022 4.8  3.5 - 5.1  mmol/L Final   Chloride 09/12/2022 102  98 - 111 mmol/L Final   CO2 09/12/2022 27  22 - 32 mmol/L Final   Glucose, Bld 09/12/2022  93  70 - 99 mg/dL Final   Glucose reference range applies only to samples taken after fasting for at least 8 hours.   BUN 09/12/2022 13  6 - 20 mg/dL Final   Creatinine, Ser 09/12/2022 0.57  0.44 - 1.00 mg/dL Final   Calcium 19/14/7829 9.6  8.9 - 10.3 mg/dL Final   Total Protein 56/21/3086 7.6  6.5 - 8.1 g/dL Final   Albumin 57/84/6962 4.5  3.5 - 5.0 g/dL Final   AST 95/28/4132 24  15 - 41 U/L Final   ALT 09/12/2022 23  0 - 44 U/L Final   Alkaline Phosphatase 09/12/2022 58  38 - 126 U/L Final   Total Bilirubin 09/12/2022 0.5  0.3 - 1.2 mg/dL Final   GFR, Estimated 09/12/2022 >60  >60 mL/min Final   Comment: (NOTE) Calculated using the CKD-EPI Creatinine Equation (2021)    Anion gap 09/12/2022 9  5 - 15 Final   Performed at [facility], [address], [place], [place] [address]   Alcohol, Ethyl (B) 09/12/2022 <10  <10 mg/dL Final   Comment: (NOTE) Lowest detectable limit for serum alcohol is 10 mg/dL.  For medical purposes only. Performed at [facility], [address] [place], [place] [address]    WBC 09/12/2022 3.3 (L)  4.0 - 10.5 K/uL Final   RBC 09/12/2022 5.06  3.87 - 5.11 MIL/uL Final   Hemoglobin 09/12/2022 13.1  12.0 - 15.0 g/dL Final   HCT 66/44/0347 42.4  36.0 - 46.0 % Final   MCV 09/12/2022 83.8  80.0 - 100.0 fL Final   MCH 09/12/2022 25.9 (L)  26.0 - 34.0 pg Final   MCHC 09/12/2022 30.9  30.0 - 36.0 g/dL Final   RDW 42/59/5638 14.1  11.5 - 15.5 % Final   Platelets 09/12/2022 211  150 - 400 K/uL Final   nRBC 09/12/2022 0.0  0.0 - 0.2 % Final   Performed at [facility], [address] [place], [place] [address]   Opiates 09/12/2022 POSITIVE (A)  NONE DETECTED Final   Cocaine 09/12/2022 POSITIVE (A)  NONE DETECTED Final   Benzodiazepines 09/12/2022 POSITIVE (A)  NONE DETECTED Final   Amphetamines 09/12/2022 NONE DETECTED  NONE DETECTED Final   Tetrahydrocannabinol 09/12/2022 NONE DETECTED  NONE DETECTED  Final   Barbiturates 09/12/2022 NONE DETECTED  NONE DETECTED Final   Comment: (NOTE) DRUG SCREEN FOR MEDICAL PURPOSES ONLY.  IF CONFIRMATION IS NEEDED FOR ANY PURPOSE, NOTIFY LAB WITHIN 5 DAYS.  LOWEST DETECTABLE LIMITS FOR URINE DRUG SCREEN Drug Class                     Cutoff (ng/mL) Amphetamine and metabolites    1000 Barbiturate and metabolites    200 Benzodiazepine                 200 Opiates and metabolites        300 Cocaine and metabolites        300 THC                            50 Performed at [facility], [address] [place], [place] [address]    Preg, Serum 09/12/2022 NEGATIVE  NEGATIVE Final   Comment:  THE SENSITIVITY OF THIS METHODOLOGY IS >10 mIU/mL. Performed at [facility], [address] [place], [place] [address]    Specimen Source 09/12/2022 URINE, CLEAN CATCH   Final   Color, Urine 09/12/2022 YELLOW  YELLOW Final   APPearance 09/12/2022 CLEAR  CLEAR Final   Specific Gravity, Urine 09/12/2022 1.023  1.005 - 1.030 Final   pH 09/12/2022 6.0  5.0 - 8.0 Final   Glucose, UA 09/12/2022 NEGATIVE  NEGATIVE mg/dL Final   Hgb urine dipstick 09/12/2022 NEGATIVE  NEGATIVE Final   Bilirubin Urine 09/12/2022 NEGATIVE  NEGATIVE Final   Ketones, ur 09/12/2022 NEGATIVE  NEGATIVE mg/dL Final   Protein, ur 29/56/2130 NEGATIVE  NEGATIVE mg/dL Final   Nitrite 86/57/8469 NEGATIVE  NEGATIVE Final   Leukocytes,Ua 09/12/2022 NEGATIVE  NEGATIVE Final   RBC / HPF 09/12/2022 0-5  0 - 5 RBC/hpf Final   WBC, UA 09/12/2022 0-5  0 - 5 WBC/hpf Final   Comment:        Reflex urine culture not performed if WBC <=10, OR if Squamous epithelial cells >5. If Squamous epithelial cells >5 suggest recollection.    Bacteria, UA 09/12/2022 NONE SEEN  NONE SEEN Final   Squamous Epithelial / HPF 09/12/2022 0-5  0 - 5 /HPF Final   Mucus 09/12/2022 PRESENT   Final   Performed at [facility], [address],  [place], [place] [address]    Allergies: Blueberry fruit extract, Contrast media [iodinated contrast media], Codeine, Latex, and Omnipaque [iohexol]  Medications:  Facility Ordered Medications  Medication   acetaminophen (TYLENOL) tablet 650 mg   alum & mag hydroxide-simeth (MAALOX/MYLANTA) 200-200-20 MG/5ML suspension 30 mL   magnesium hydroxide (MILK OF MAGNESIA) suspension 30 mL   PTA Medications  Medication Sig   gabapentin (NEURONTIN) 600 MG tablet Take 600 mg by mouth every 12 (twelve) hours.   ibuprofen (ADVIL) 800 MG tablet Take 800 mg by mouth every 8 (eight) hours as needed (for pain).   methocarbamol (ROBAXIN) 500 MG tablet Take 1 tablet (500 mg total) by mouth every 8 (eight) hours as needed for muscle spasms.   naproxen (NAPROSYN) 375 MG tablet Take 1 tablet (375 mg total) by mouth 2 (two) times daily. (Patient taking differently: Take 375 mg by mouth 2 (two) times daily as needed (For pain).)   ondansetron (ZOFRAN-ODT) 4 MG disintegrating tablet Take 1 tablet (4 mg total) by mouth every 8 (eight) hours as needed for nausea or vomiting.   nicotine polacrilex (NICORETTE) 2 MG gum Take 1 each (2 mg total) by mouth as needed for smoking cessation.      Medical Decision Making  Discussed recommendation for admission to the continuous observation unit for safety monitoring pending collateral from petitioner. [facility] staff attempted to reach the petitioner by phone, but was unsuccessful.  Recommend re-eval with collateral in the am. Patient under IVC, collateral pending.   Lab Orders         CBC with Differential/Platelet         Comprehensive metabolic panel         POC urine preg, ED         POCT Urine Drug Screen - (I-Screen)      Recommend Prn COWS protocol  Other Prns-Tylenol, Maalox, MOM   Recommendations  Based on my evaluation the patient does not appear to have an emergency medical condition.  Recommend admission to continuous observation unit for safety monitoring  and re-eval in the am.   Jaykwon Morones  Royston Bake, NP 11/18/22  9:47 PM

## 2022-11-19 ENCOUNTER — Encounter (HOSPITAL_COMMUNITY): Payer: Self-pay

## 2022-11-19 ENCOUNTER — Other Ambulatory Visit (HOSPITAL_COMMUNITY)
Admission: EM | Admit: 2022-11-19 | Discharge: 2022-11-24 | Disposition: A | Discharge status: Home / Self Care | Payer: Medicaid Other | Attending: Psychiatry | Admitting: Psychiatry

## 2022-11-19 ENCOUNTER — Other Ambulatory Visit: Payer: Self-pay

## 2022-11-19 DIAGNOSIS — F1120 Opioid dependence, uncomplicated: Secondary | ICD-10-CM | POA: Insufficient documentation

## 2022-11-19 DIAGNOSIS — F1310 Sedative, hypnotic or anxiolytic abuse, uncomplicated: Secondary | ICD-10-CM | POA: Insufficient documentation

## 2022-11-19 DIAGNOSIS — B998 Other infectious disease: Secondary | ICD-10-CM | POA: Insufficient documentation

## 2022-11-19 DIAGNOSIS — F1994 Other psychoactive substance use, unspecified with psychoactive substance-induced mood disorder: Secondary | ICD-10-CM | POA: Diagnosis not present

## 2022-11-19 DIAGNOSIS — F1990 Other psychoactive substance use, unspecified, uncomplicated: Secondary | ICD-10-CM | POA: Diagnosis present

## 2022-11-19 LAB — CBC WITH DIFFERENTIAL/PLATELET
Abs Immature Granulocytes: 0.01 10*3/uL (ref 0.00–0.07)
Basophils Absolute: 0 10*3/uL (ref 0.0–0.1)
Basophils Relative: 1 %
Eosinophils Absolute: 0.4 10*3/uL (ref 0.0–0.5)
Eosinophils Relative: 10 %
HCT: 37.7 % (ref 36.0–46.0)
Hemoglobin: 11.8 g/dL — ABNORMAL LOW (ref 12.0–15.0)
Immature Granulocytes: 0 %
Lymphocytes Relative: 42 %
Lymphs Abs: 1.6 10*3/uL (ref 0.7–4.0)
MCH: 25.8 pg — ABNORMAL LOW (ref 26.0–34.0)
MCHC: 31.3 g/dL (ref 30.0–36.0)
MCV: 82.3 fL (ref 80.0–100.0)
Monocytes Absolute: 0.3 10*3/uL (ref 0.1–1.0)
Monocytes Relative: 7 %
Neutro Abs: 1.5 10*3/uL — ABNORMAL LOW (ref 1.7–7.7)
Neutrophils Relative %: 40 %
Platelets: 173 10*3/uL (ref 150–400)
RBC: 4.58 MIL/uL (ref 3.87–5.11)
RDW: 14 % (ref 11.5–15.5)
WBC: 3.9 10*3/uL — ABNORMAL LOW (ref 4.0–10.5)
nRBC: 0 % (ref 0.0–0.2)

## 2022-11-19 LAB — COMPREHENSIVE METABOLIC PANEL
ALT: 57 U/L — ABNORMAL HIGH (ref 0–44)
AST: 82 U/L — ABNORMAL HIGH (ref 15–41)
Albumin: 3.6 g/dL (ref 3.5–5.0)
Alkaline Phosphatase: 69 U/L (ref 38–126)
Anion gap: 9 (ref 5–15)
BUN: 10 mg/dL (ref 6–20)
CO2: 26 mmol/L (ref 22–32)
Calcium: 8.6 mg/dL — ABNORMAL LOW (ref 8.9–10.3)
Chloride: 107 mmol/L (ref 98–111)
Creatinine, Ser: 0.64 mg/dL (ref 0.44–1.00)
GFR, Estimated: >60 mL/min (ref >60)
Glucose, Bld: 74 mg/dL (ref 70–99)
Potassium: 4 mmol/L (ref 3.5–5.1)
Sodium: 142 mmol/L (ref 135–145)
Total Bilirubin: 0.4 mg/dL (ref 0.3–1.2)
Total Protein: 5.7 g/dL — ABNORMAL LOW (ref 6.5–8.1)

## 2022-11-19 MED ORDER — CLONIDINE HCL 0.1 MG PO TABS: 0.1000 mg | ORAL_TABLET | ORAL | Status: DC

## 2022-11-19 MED ORDER — LORAZEPAM 1 MG PO TABS: 1.0000 mg | ORAL_TABLET | ORAL | Status: DC | PRN

## 2022-11-19 MED ORDER — LORAZEPAM 1 MG PO TABS
1.0000 mg | ORAL_TABLET | Freq: Every day | ORAL | Status: AC
  Administered 2022-11-23: 1 mg via ORAL
  Filled 2022-11-19: qty 1

## 2022-11-19 MED ORDER — ONDANSETRON 4 MG PO TBDP: 4.0000 mg | ORAL_TABLET | Freq: Four times a day (QID) | ORAL | Status: AC | PRN

## 2022-11-19 MED ORDER — LORAZEPAM 1 MG PO TABS
1.0000 mg | ORAL_TABLET | Freq: Three times a day (TID) | ORAL | Status: AC
  Administered 2022-11-20 – 2022-11-21 (×3): 1 mg via ORAL
  Filled 2022-11-19 (×3): qty 1

## 2022-11-19 MED ORDER — THIAMINE MONONITRATE 100 MG PO TABS
100.0000 mg | ORAL_TABLET | Freq: Every day | ORAL | Status: DC
  Administered 2022-11-20 – 2022-11-24 (×5): 100 mg via ORAL
  Filled 2022-11-19 (×5): qty 1

## 2022-11-19 MED ORDER — LORAZEPAM 1 MG PO TABS
1.0000 mg | ORAL_TABLET | Freq: Two times a day (BID) | ORAL | Status: AC
  Administered 2022-11-21 – 2022-11-22 (×2): 1 mg via ORAL
  Filled 2022-11-19 (×2): qty 1

## 2022-11-19 MED ORDER — ZIPRASIDONE MESYLATE 20 MG IM SOLR: 20.0000 mg | INTRAMUSCULAR | Status: DC | PRN

## 2022-11-19 MED ORDER — DIPHENHYDRAMINE HCL 50 MG/ML IJ SOLN: 50.0000 mg | Freq: Two times a day (BID) | INTRAMUSCULAR | Status: DC | PRN

## 2022-11-19 MED ORDER — THIAMINE HCL 100 MG/ML IJ SOLN
100.0000 mg | Freq: Once | INTRAMUSCULAR | Status: AC
  Administered 2022-11-19: 100 mg via INTRAMUSCULAR
  Filled 2022-11-19: qty 2

## 2022-11-19 MED ORDER — HYDROXYZINE HCL 25 MG PO TABS
25.0000 mg | ORAL_TABLET | Freq: Three times a day (TID) | ORAL | Status: DC | PRN
  Administered 2022-11-20 – 2022-11-24 (×8): 25 mg via ORAL
  Filled 2022-11-19 (×9): qty 1

## 2022-11-19 MED ORDER — LORAZEPAM 1 MG PO TABS
1.0000 mg | ORAL_TABLET | Freq: Four times a day (QID) | ORAL | Status: AC | PRN
  Administered 2022-11-19: 1 mg via ORAL

## 2022-11-19 MED ORDER — LOPERAMIDE HCL 2 MG PO CAPS: 2.0000 mg | ORAL_CAPSULE | ORAL | Status: AC | PRN

## 2022-11-19 MED ORDER — THIAMINE MONONITRATE 100 MG PO TABS: 100.0000 mg | ORAL_TABLET | Freq: Every day | ORAL | Status: DC

## 2022-11-19 MED ORDER — DICYCLOMINE HCL 20 MG PO TABS
20.0000 mg | ORAL_TABLET | Freq: Four times a day (QID) | ORAL | Status: AC | PRN
Start: 2022-11-19 — End: 2022-11-24
  Filled 2022-11-19: qty 1

## 2022-11-19 MED ORDER — NAPROXEN 500 MG PO TABS
500.0000 mg | ORAL_TABLET | Freq: Two times a day (BID) | ORAL | Status: DC | PRN
  Administered 2022-11-19 – 2022-11-20 (×2): 500 mg via ORAL
  Filled 2022-11-19 (×2): qty 1

## 2022-11-19 MED ORDER — OLANZAPINE 10 MG PO TBDP
10.0000 mg | ORAL_TABLET | Freq: Three times a day (TID) | ORAL | Status: DC | PRN
  Administered 2022-11-22 – 2022-11-23 (×3): 10 mg via ORAL
  Filled 2022-11-19 (×3): qty 1

## 2022-11-19 MED ORDER — LORAZEPAM 1 MG PO TABS
1.0000 mg | ORAL_TABLET | ORAL | Status: DC | PRN
  Filled 2022-11-19: qty 1

## 2022-11-19 MED ORDER — METHOCARBAMOL 500 MG PO TABS
500.0000 mg | ORAL_TABLET | Freq: Three times a day (TID) | ORAL | Status: AC | PRN
Start: 2022-11-19 — End: 2022-11-24
  Administered 2022-11-19 – 2022-11-24 (×5): 500 mg via ORAL
  Filled 2022-11-19 (×5): qty 1

## 2022-11-19 MED ORDER — ADULT MULTIVITAMIN W/MINERALS CH
1.0000 | ORAL_TABLET | Freq: Every day | ORAL | Status: DC
  Administered 2022-11-19: 1 via ORAL
  Filled 2022-11-19: qty 1

## 2022-11-19 MED ORDER — LORAZEPAM 1 MG PO TABS
1.0000 mg | ORAL_TABLET | Freq: Four times a day (QID) | ORAL | Status: AC
  Administered 2022-11-19 – 2022-11-20 (×4): 1 mg via ORAL
  Filled 2022-11-19 (×4): qty 1

## 2022-11-19 MED ORDER — MAGNESIUM HYDROXIDE 400 MG/5ML PO SUSP: 30.0000 mL | Freq: Every day | ORAL | Status: DC | PRN

## 2022-11-19 MED ORDER — ZIPRASIDONE MESYLATE 20 MG IM SOLR: 20.0000 mg | Freq: Two times a day (BID) | INTRAMUSCULAR | Status: DC | PRN

## 2022-11-19 MED ORDER — TRAZODONE HCL 50 MG PO TABS
50.0000 mg | ORAL_TABLET | Freq: Every evening | ORAL | Status: DC | PRN
  Administered 2022-11-19 – 2022-11-23 (×5): 50 mg via ORAL
  Filled 2022-11-19 (×5): qty 1

## 2022-11-19 MED ORDER — OLANZAPINE 10 MG IM SOLR
5.0000 mg | Freq: Once | INTRAMUSCULAR | Status: AC
  Administered 2022-11-19: 5 mg via INTRAMUSCULAR
  Filled 2022-11-19: qty 10

## 2022-11-19 MED ORDER — LORAZEPAM 1 MG PO TABS: 1.0000 mg | ORAL_TABLET | Freq: Four times a day (QID) | ORAL | Status: DC | PRN

## 2022-11-19 MED ORDER — CLONIDINE HCL 0.1 MG PO TABS
0.1000 mg | ORAL_TABLET | Freq: Four times a day (QID) | ORAL | Status: DC
  Administered 2022-11-19 – 2022-11-20 (×6): 0.1 mg via ORAL
  Filled 2022-11-19 (×7): qty 1

## 2022-11-19 MED ORDER — ALUM & MAG HYDROXIDE-SIMETH 200-200-20 MG/5ML PO SUSP: 30.0000 mL | ORAL | Status: DC | PRN

## 2022-11-19 MED ORDER — LORAZEPAM 2 MG/ML IJ SOLN
1.0000 mg | INTRAMUSCULAR | Status: DC | PRN
  Administered 2022-11-19: 1 mg via INTRAMUSCULAR
  Filled 2022-11-19: qty 1

## 2022-11-19 MED ORDER — CLONIDINE HCL 0.1 MG PO TABS: 0.1000 mg | ORAL_TABLET | Freq: Every day | ORAL | Status: DC

## 2022-11-19 MED ORDER — ADULT MULTIVITAMIN W/MINERALS CH
1.0000 | ORAL_TABLET | Freq: Every day | ORAL | Status: DC
  Administered 2022-11-20 – 2022-11-24 (×5): 1 via ORAL
  Filled 2022-11-19 (×5): qty 1

## 2022-11-19 MED ORDER — ACETAMINOPHEN 325 MG PO TABS
650.0000 mg | ORAL_TABLET | Freq: Once | ORAL | Status: AC
  Administered 2022-11-19: 650 mg via ORAL
  Filled 2022-11-19: qty 2
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
## 2022-11-19 NOTE — ED Notes (Signed)
Pt was provided dinner.

## 2022-11-19 NOTE — ED Notes (Signed)
Pt transferred from [facility], under IVC, agitated, tearful, transferred for polysubstance abuse.  Pt crying, stating she does not want to be here.  Pt medicated, resting at present.  No distress noted.  Monitoring for safety.

## 2022-11-19 NOTE — ED Notes (Signed)
Pt is currently sleeping, no distress noted, environmental check complete, will continue to monitor patient for safety.  

## 2022-11-19 NOTE — Discharge Instructions (Addendum)
Transfer to the [facility]

## 2022-11-19 NOTE — ED Provider Notes (Cosign Needed Addendum)
Facility Based Crisis Admission H&P  Date: 11/19/22 Patient Name: [name] MRN: [id number] Chief Complaint: "Get me out"  Diagnoses:  Final diagnoses:  Substance induced mood disorder (HCC)  Opioid type dependence, continuous (HCC)  Mild benzodiazepine use disorder (HCC)   [name] is a 27 yo female with an extensive hx of opioid use disorder, presenting to [facility] under IVC, initially petition by mother, for rehab from heroin abuse.  UDS on admission is only positive for benzodiazepines.   HPI:  The patient was brought to the unit and was observed crying and adamantly requesting discharge.  The patient verbalized her disagreement with the IVC process and is demanding to be released.  She provided verbal consent for the treatment team to contact her mother.  The patient denies any recent opioid use and reports taking Xanax as her only current substance use.  Denies suicidal ideation, homicidal ideation, or hallucinations. Mood is distressed, and affect is labile.  Collateral Call on 11/19/22 :  Spoke with the patient's mother, [name], who reports the patient and her sister have been using substances together. She has found the patient sedated at home and is concerned for her safety, stating, "I am really worried about her, she is going to end up overdosing and dying". The mother confirms the patient lives with her brother and younger sister and recalls an incident a few months ago where the patient nearly died. The patient's substance use has been disruptive to her social and occupational functioning. The mother reports the patient has been using heroin and Xanax and suspects she has also used fentanyl.   Per admission note to [facility] on 11/19/2022: "[name] is a 27 year old female with psychiatric history of ADHD, OUD, Opioid withdrawals, Polysubstance abuse, anxiety,and Depression ,who presented to [facility] via [facility] under IVC.   Per IVC Petition:  Respondent has been diagnosed  with depression. Glorious is currently having an episode which caused an influx in drug use. She recently OD on Sunday and refused to be transported to the hospital. Respondent has stated several times that she wants to die.  She is using Heroin, Xanax, and Fentanyl.    Patient was seen face to face by this provider and chart reviewed. Per chart review, patient has had 6 ED visits in the last 6 months. Patient was recently admitted to the [facility] for substance abuse treatment, but left AMA.    On evaluation, patient is alert, oriented x 4, and cooperative. Speech is clear, slow and coherent. Pt appears casually dressed. Eye contact is fair. Mood is depressed and anxious, affect is congruent with mood. Thought process is coherent and thought content is WDL. Pt denies SI/HI/AVH. There is no objective indication that the patient is responding to internal stimuli. No delusions elicited during this assessment.     Patient reports " My mom had me committed against my will because she feel like I'm a danger to myself, saying I was on drugs because she saw me take a klonopin, just one pill, but I don't feel so, this is not the first time she's done this and I feel like it's totally uncalled for".    Patient categorically denies all the claims in the [facility] Petition, and denies being a danger to herself or others.She reports living alone and denies access to a gun or weapon. She also denies illicit substance use.    Patient reports she is not established with outpatient psychiatric service and states " I don't  have a problem".    Support, encouragement, reassurance provided about ongoing stressors.  Patient is provided with opportunity for questions.   Discussed recommendation for admission to the continuous observation unit for safety monitoring pending collateral from petitioner. [facility] staff attempted to reach the petitioner by phone, but was unsuccessful.  Recommend re-eval with collateral in the am."  Past  Psychiatric History:  Dx: opioid use disorder, MDD, and anxiety Currently not seeing a psychiatrist or therapist. Currently not taking any prescribed medication.  Took Suboxone was in June 2024  Past Medical History:         Past Medical History:  Diagnosis Date   ADHD (attention deficit hyperactivity disorder)     Anxiety     Cholecystitis     Depression     Dysmenorrhea     Endometriosis     GERD (gastroesophageal reflux disease)     Headache      migraines   Hyperlipidemia     Hypoglycemia     Polysubstance abuse (HCC)     Pregnancy complicated by subutex maintenance, antepartum (HCC)     Syncope      Family History:   Anesthesia problems Maternal Grandfather     Heart disease Maternal Grandfather     Nephrolithiasis Maternal Grandfather     Diabetes Maternal Grandfather     Mental illness Maternal Grandfather     Cholelithiasis Mother     Nephrolithiasis Mother     Depression Mother     Hypertension Mother     Miscarriages / Stillbirths Mother     Anxiety disorder Mother     Gout Father     Nephrolithiasis Maternal Grandmother     COPD Maternal Grandmother     Heart disease Paternal Grandfather     Cholelithiasis Maternal Aunt     Depression Maternal Aunt     Learning disabilities Maternal Aunt     Bipolar disorder Sister      Social History: She lives with brother, patient lost custody of her daughter.   PHQ 2-9:  Flowsheet Row ED from 10/08/2022 in [facility] Office Visit from 03/12/2021 in [facility] Office Visit from 02/12/2021 in [facility]  Thoughts that you would be better off dead, or of hurting yourself in some way Not at all Not at all Not at all  PHQ-9 Total Score 15 7 6        Flowsheet Row ED from 11/19/2022 in [facility] ED from 11/18/2022 in [facility] ED from 10/08/2022 in [facility]  C-SSRS RISK CATEGORY High Risk No Risk No Risk        Total Time spent with patient: 1 hour  Musculoskeletal  Strength & Muscle Tone: within normal limits Gait & Station: normal Patient leans: N/A  Psychiatric Specialty Exam  Presentation General Appearance:  Casual  Eye Contact: Fair  Speech: Slow; Clear and Coherent  Speech Volume: Normal  Handedness: Right   Mood and Affect  Mood: Irritable, tearful, in distress  Affect: Labile   Thought Process  Thought Processes: Coherent  Descriptions of Associations:Intact  Orientation:grossly intact  Thought Content:WDL  Diagnosis of Schizophrenia or Schizoaffective disorder in past: No   Hallucinations:Hallucinations: None  Ideas of Reference:None  Suicidal Thoughts:Suicidal Thoughts: No  Homicidal Thoughts:Homicidal Thoughts: No   Sensorium  Memory: Immediate Fair  Judgment: Poor  Insight: Poor   Executive Functions  Concentration: Fair  Attention Span: Fair  Recall: [employer] of Knowledge: Fair  Language: Fair   Psychomotor Activity  Psychomotor Activity: Psychomotor Activity: Normal   Assets  Assets: Manufacturing systems engineer; Desire for Improvement   Sleep  Sleep: Sleep: Fair   Nutritional Assessment (For OBS and FBC admissions only) Has the patient had a weight loss or gain of 10 pounds or more in the last 3 months?: No Has the patient had a decrease in food intake/or appetite?: No Does the patient have dental problems?: No Does the patient have eating habits or behaviors that may be indicators of an eating disorder including binging or inducing vomiting?: No Has the patient recently lost weight without trying?: 0 Has the patient been eating poorly because of a decreased appetite?: 0 Malnutrition Screening Tool Score: 0    Physical Exam Vitals and nursing note reviewed.  Constitutional:      General: She is in acute distress.  Pulmonary:     Effort:  Pulmonary effort is normal.  Skin:    General: Skin is warm and dry.    Review of Systems  All other systems reviewed and are negative.   There were no vitals taken for this visit. There is no height or weight on file to calculate BMI.    Last Labs:  Admission on 11/18/2022, Discharged on 11/19/2022  Component Date Value Ref Range Status   WBC 11/18/2022 3.9 (L)  4.0 - 10.5 K/uL Final   RBC 11/18/2022 4.58  3.87 - 5.11 MIL/uL Final   Hemoglobin 11/18/2022 11.8 (L)  12.0 - 15.0 g/dL Final   HCT 96/04/5407 37.7  36.0 - 46.0 % Final   MCV 11/18/2022 82.3  80.0 - 100.0 fL Final   MCH 11/18/2022 25.8 (L)  26.0 - 34.0 pg Final   MCHC 11/18/2022 31.3  30.0 - 36.0 g/dL Final   RDW 81/19/1478 14.0  11.5 - 15.5 % Final   Platelets 11/18/2022 173  150 - 400 K/uL Final   nRBC 11/18/2022 0.0  0.0 - 0.2 % Final   Neutrophils Relative % 11/18/2022 40  % Final   Neutro Abs 11/18/2022 1.5 (L)  1.7 - 7.7 K/uL Final   Lymphocytes Relative 11/18/2022 42  % Final   Lymphs Abs 11/18/2022 1.6  0.7 - 4.0 K/uL Final   Monocytes Relative 11/18/2022 7  % Final   Monocytes Absolute 11/18/2022 0.3  0.1 - 1.0 K/uL Final   Eosinophils Relative 11/18/2022 10  % Final   Eosinophils Absolute 11/18/2022 0.4  0.0 - 0.5 K/uL Final   Basophils Relative 11/18/2022 1  % Final   Basophils Absolute 11/18/2022 0.0  0.0 - 0.1 K/uL Final   Immature Granulocytes 11/18/2022 0  % Final   Abs Immature Granulocytes 11/18/2022 0.01  0.00 - 0.07 K/uL Final   Performed at [facility], [address], [place], [place] [address]   Sodium 11/18/2022 142  135 - 145 mmol/L Final   Potassium 11/18/2022 4.0  3.5 - 5.1 mmol/L Final   Chloride 11/18/2022 107  98 - 111 mmol/L Final   CO2 11/18/2022 26  22 - 32 mmol/L Final   Glucose, Bld 11/18/2022 74  70 - 99 mg/dL Final   Glucose reference range applies only to samples taken after fasting for at least 8 hours.   BUN 11/18/2022 10  6 - 20 mg/dL Final   Creatinine, Ser  11/18/2022 0.64  0.44 - 1.00 mg/dL Final   Calcium 13/08/6576 8.6 (L)  8.9 -  10.3 mg/dL Final   Total Protein 16/10/9602 5.7 (L)  6.5 - 8.1 g/dL Final   Albumin 54/09/8117 3.6  3.5 - 5.0 g/dL Final   AST 14/78/2956 82 (H)  15 - 41 U/L Final   ALT 11/18/2022 57 (H)  0 - 44 U/L Final   Alkaline Phosphatase 11/18/2022 69  38 - 126 U/L Final   Total Bilirubin 11/18/2022 0.4  0.3 - 1.2 mg/dL Final   GFR, Estimated 11/18/2022 >60  >60 mL/min Final   Comment: (NOTE) Calculated using the CKD-EPI Creatinine Equation (2021)    Anion gap 11/18/2022 9  5 - 15 Final   Performed at [facility], [address] [place], [place] [address]   Preg Test, Ur 11/18/2022 Negative  Negative Final   POC Amphetamine UR 11/18/2022 None Detected  NONE DETECTED (Cut Off Level 1000 ng/mL) Final   POC Secobarbital (BAR) 11/18/2022 None Detected  NONE DETECTED (Cut Off Level 300 ng/mL) Final   POC Buprenorphine (BUP) 11/18/2022 None Detected  NONE DETECTED (Cut Off Level 10 ng/mL) Final   POC Oxazepam (BZO) 11/18/2022 Positive (A)  NONE DETECTED (Cut Off Level 300 ng/mL) Final   POC Cocaine UR 11/18/2022 None Detected  NONE DETECTED (Cut Off Level 300 ng/mL) Final   POC Methamphetamine UR 11/18/2022 None Detected  NONE DETECTED (Cut Off Level 1000 ng/mL) Final   POC Morphine 11/18/2022 None Detected  NONE DETECTED (Cut Off Level 300 ng/mL) Final   POC Methadone UR 11/18/2022 None Detected  NONE DETECTED (Cut Off Level 300 ng/mL) Final   POC Oxycodone UR 11/18/2022 None Detected  NONE DETECTED (Cut Off Level 100 ng/mL) Final   POC Marijuana UR 11/18/2022 None Detected  NONE DETECTED (Cut Off Level 50 ng/mL) Final  Admission on 10/08/2022, Discharged on 10/10/2022  Component Date Value Ref Range Status   WBC 10/09/2022 3.6 (L)  4.0 - 10.5 K/uL Final   RBC 10/09/2022 4.83  3.87 - 5.11 MIL/uL Final   Hemoglobin 10/09/2022 12.4  12.0 - 15.0 g/dL Final   HCT 65/78/4696 39.2  36.0 - 46.0 % Final   MCV 10/09/2022  81.2  80.0 - 100.0 fL Final   MCH 10/09/2022 25.7 (L)  26.0 - 34.0 pg Final   MCHC 10/09/2022 31.6  30.0 - 36.0 g/dL Final   RDW 29/52/8413 14.3  11.5 - 15.5 % Final   Platelets 10/09/2022 190  150 - 400 K/uL Final   nRBC 10/09/2022 0.0  0.0 - 0.2 % Final   Neutrophils Relative % 10/09/2022 43  % Final   Neutro Abs 10/09/2022 1.5 (L)  1.7 - 7.7 K/uL Final   Lymphocytes Relative 10/09/2022 45  % Final   Lymphs Abs 10/09/2022 1.6  0.7 - 4.0 K/uL Final   Monocytes Relative 10/09/2022 7  % Final   Monocytes Absolute 10/09/2022 0.2  0.1 - 1.0 K/uL Final   Eosinophils Relative 10/09/2022 4  % Final   Eosinophils Absolute 10/09/2022 0.1  0.0 - 0.5 K/uL Final   Basophils Relative 10/09/2022 1  % Final   Basophils Absolute 10/09/2022 0.0  0.0 - 0.1 K/uL Final   Immature Granulocytes 10/09/2022 0  % Final   Abs Immature Granulocytes 10/09/2022 0.00  0.00 - 0.07 K/uL Final   Performed at [facility], [address], [place], [place] [address]   Sodium 10/09/2022 141  135 - 145 mmol/L Final   Potassium 10/09/2022 4.6  3.5 - 5.1 mmol/L Final   Chloride 10/09/2022 101  98 - 111 mmol/L Final   CO2 10/09/2022 27  22 - 32 mmol/L Final   Glucose, Bld 10/09/2022 80  70 - 99 mg/dL Final   Glucose reference range applies only to samples taken after fasting for at least 8 hours.   BUN 10/09/2022 8  6 - 20 mg/dL Final   Creatinine, Ser 10/09/2022 0.70  0.44 - 1.00 mg/dL Final   Calcium 40/98/1191 9.3  8.9 - 10.3 mg/dL Final   Total Protein 47/82/9562 6.0 (L)  6.5 - 8.1 g/dL Final   Albumin 13/08/6576 4.0  3.5 - 5.0 g/dL Final   AST 46/96/2952 16  15 - 41 U/L Final   ALT 10/09/2022 14  0 - 44 U/L Final   Alkaline Phosphatase 10/09/2022 48  38 - 126 U/L Final   Total Bilirubin 10/09/2022 0.8  0.3 - 1.2 mg/dL Final   GFR, Estimated 10/09/2022 >60  >60 mL/min Final   Comment: (NOTE) Calculated using the CKD-EPI Creatinine Equation (2021)    Anion gap 10/09/2022 13  5 - 15 Final   Performed at  [facility], [address] [place], [place] [address]   Alcohol, Ethyl (B) 10/09/2022 <10  <10 mg/dL Final   Comment: (NOTE) Lowest detectable limit for serum alcohol is 10 mg/dL.  For medical purposes only. Performed at [facility], [address] [place], [place] [address]    TSH 10/09/2022 0.553  0.350 - 4.500 uIU/mL Final   Comment: Performed by a 3rd Generation assay with a functional sensitivity of <=0.01 uIU/mL. Performed at [facility], [address] [place], [place] [address]    Preg Test, Ur 10/10/2022 Negative  Negative Final   POC Amphetamine UR 10/10/2022 None Detected  NONE DETECTED (Cut Off Level 1000 ng/mL) Final   POC Secobarbital (BAR) 10/10/2022 None Detected  NONE DETECTED (Cut Off Level 300 ng/mL) Final   POC Buprenorphine (BUP) 10/10/2022 None Detected  NONE DETECTED (Cut Off Level 10 ng/mL) Final   POC Oxazepam (BZO) 10/10/2022 None Detected  NONE DETECTED (Cut Off Level 300 ng/mL) Final   POC Cocaine UR 10/10/2022 Positive (A)  NONE DETECTED (Cut Off Level 300 ng/mL) Final   POC Methamphetamine UR 10/10/2022 None Detected  NONE DETECTED (Cut Off Level 1000 ng/mL) Final   POC Morphine 10/10/2022 None Detected  NONE DETECTED (Cut Off Level 300 ng/mL) Final   POC Methadone UR 10/10/2022 None Detected  NONE DETECTED (Cut Off Level 300 ng/mL) Final   POC Oxycodone UR 10/10/2022 None Detected  NONE DETECTED (Cut Off Level 100 ng/mL) Final   POC Marijuana UR 10/10/2022 Positive (A)  NONE DETECTED (Cut Off Level 50 ng/mL) Final   HIV Screen 4th Generation wRfx 10/09/2022 Non Reactive  Non Reactive Final   Performed at [facility], [address], [place], [place] [address]   Hepatitis B Surface Ag 10/09/2022 NON REACTIVE  NON REACTIVE Final   HCV Ab 10/09/2022 NON REACTIVE  NON REACTIVE Final   Comment: (NOTE) Nonreactive HCV antibody screen is consistent with no HCV infections,  unless recent infection is suspected or other evidence  exists to indicate HCV infection.     Hep A IgM 10/09/2022 NON REACTIVE  NON REACTIVE Final   Hep B C IgM 10/09/2022 NON REACTIVE  NON REACTIVE Final   Performed at [facility], [address], [place], [place] [address]   RPR Ser Ql 10/09/2022 NON REACTIVE  NON REACTIVE Final   Performed  at [facility], [address], [place], [place] [address]  Admission on 09/16/2022, Discharged on 09/17/2022  Component Date Value Ref Range Status   WBC 09/16/2022 4.8  4.0 - 10.5 K/uL Final   RBC 09/16/2022 4.76  3.87 - 5.11 MIL/uL Final   Hemoglobin 09/16/2022 12.0  12.0 - 15.0 g/dL Final   HCT 36/64/4034 39.3  36.0 - 46.0 % Final   MCV 09/16/2022 82.6  80.0 - 100.0 fL Final   MCH 09/16/2022 25.2 (L)  26.0 - 34.0 pg Final   MCHC 09/16/2022 30.5  30.0 - 36.0 g/dL Final   RDW 74/25/9563 14.3  11.5 - 15.5 % Final   Platelets 09/16/2022 222  150 - 400 K/uL Final   nRBC 09/16/2022 0.0  0.0 - 0.2 % Final   Neutrophils Relative % 09/16/2022 47  % Final   Neutro Abs 09/16/2022 2.2  1.7 - 7.7 K/uL Final   Lymphocytes Relative 09/16/2022 44  % Final   Lymphs Abs 09/16/2022 2.1  0.7 - 4.0 K/uL Final   Monocytes Relative 09/16/2022 6  % Final   Monocytes Absolute 09/16/2022 0.3  0.1 - 1.0 K/uL Final   Eosinophils Relative 09/16/2022 2  % Final   Eosinophils Absolute 09/16/2022 0.1  0.0 - 0.5 K/uL Final   Basophils Relative 09/16/2022 1  % Final   Basophils Absolute 09/16/2022 0.0  0.0 - 0.1 K/uL Final   Immature Granulocytes 09/16/2022 0  % Final   Abs Immature Granulocytes 09/16/2022 0.01  0.00 - 0.07 K/uL Final   Performed at [facility], [address], [place], [place] [address]   Sodium 09/16/2022 140  135 - 145 mmol/L Final   Potassium 09/16/2022 3.4 (L)  3.5 - 5.1 mmol/L Final   Chloride 09/16/2022 102  98 - 111 mmol/L Final   CO2 09/16/2022 29  22 - 32 mmol/L Final   Glucose, Bld 09/16/2022 115 (H)  70 - 99 mg/dL Final   Glucose reference range applies only to samples  taken after fasting for at least 8 hours.   BUN 09/16/2022 25 (H)  6 - 20 mg/dL Final   Creatinine, Ser 09/16/2022 0.90  0.44 - 1.00 mg/dL Final   Calcium 33/29/5188 9.0  8.9 - 10.3 mg/dL Final   Total Protein 41/66/0630 6.4 (L)  6.5 - 8.1 g/dL Final   Albumin 16/01/930 4.1  3.5 - 5.0 g/dL Final   AST 35/57/3220 15  15 - 41 U/L Final   ALT 09/16/2022 15  0 - 44 U/L Final   Alkaline Phosphatase 09/16/2022 43  38 - 126 U/L Final   Total Bilirubin 09/16/2022 0.4  0.3 - 1.2 mg/dL Final   GFR, Estimated 09/16/2022 >60  >60 mL/min Final   Comment: (NOTE) Calculated using the CKD-EPI Creatinine Equation (2021)    Anion gap 09/16/2022 9  5 - 15 Final   Performed at [facility], [address] [place], [place] [address]   Opiates 09/16/2022 POSITIVE (A)  NONE DETECTED Final   Cocaine 09/16/2022 POSITIVE (A)  NONE DETECTED Final   Benzodiazepines 09/16/2022 POSITIVE (A)  NONE DETECTED Final   Amphetamines 09/16/2022 NONE DETECTED  NONE DETECTED Final   Tetrahydrocannabinol 09/16/2022 NONE DETECTED  NONE DETECTED Final   Barbiturates 09/16/2022 NONE DETECTED  NONE DETECTED Final   Comment: (NOTE) DRUG SCREEN FOR MEDICAL PURPOSES ONLY.  IF CONFIRMATION IS NEEDED FOR ANY PURPOSE, NOTIFY LAB WITHIN 5 DAYS.  LOWEST DETECTABLE LIMITS FOR URINE DRUG SCREEN  Drug Class                     Cutoff (ng/mL) Amphetamine and metabolites    1000 Barbiturate and metabolites    200 Benzodiazepine                 200 Opiates and metabolites        300 Cocaine and metabolites        300 THC                            50 Performed at [facility], [address], [place], [place] [address]    Preg, Serum 09/16/2022 NEGATIVE  NEGATIVE Final   Comment:        THE SENSITIVITY OF THIS METHODOLOGY IS >10 mIU/mL. Performed at [facility], [address] [place], [place] [address]   Admission on 09/12/2022, Discharged on 09/13/2022  Component Date Value Ref Range Status   Sodium  09/12/2022 138  135 - 145 mmol/L Final   Potassium 09/12/2022 4.8  3.5 - 5.1 mmol/L Final   Chloride 09/12/2022 102  98 - 111 mmol/L Final   CO2 09/12/2022 27  22 - 32 mmol/L Final   Glucose, Bld 09/12/2022 93  70 - 99 mg/dL Final   Glucose reference range applies only to samples taken after fasting for at least 8 hours.   BUN 09/12/2022 13  6 - 20 mg/dL Final   Creatinine, Ser 09/12/2022 0.57  0.44 - 1.00 mg/dL Final   Calcium 62/13/0865 9.6  8.9 - 10.3 mg/dL Final   Total Protein 78/46/9629 7.6  6.5 - 8.1 g/dL Final   Albumin 52/84/1324 4.5  3.5 - 5.0 g/dL Final   AST 40/10/2723 24  15 - 41 U/L Final   ALT 09/12/2022 23  0 - 44 U/L Final   Alkaline Phosphatase 09/12/2022 58  38 - 126 U/L Final   Total Bilirubin 09/12/2022 0.5  0.3 - 1.2 mg/dL Final   GFR, Estimated 09/12/2022 >60  >60 mL/min Final   Comment: (NOTE) Calculated using the CKD-EPI Creatinine Equation (2021)    Anion gap 09/12/2022 9  5 - 15 Final   Performed at [facility], [address], [place], [place] [address]   Alcohol, Ethyl (B) 09/12/2022 <10  <10 mg/dL Final   Comment: (NOTE) Lowest detectable limit for serum alcohol is 10 mg/dL.  For medical purposes only. Performed at [facility], [address], [place], [place] [address]    WBC 09/12/2022 3.3 (L)  4.0 - 10.5 K/uL Final   RBC 09/12/2022 5.06  3.87 - 5.11 MIL/uL Final   Hemoglobin 09/12/2022 13.1  12.0 - 15.0 g/dL Final   HCT 25/95/6387 42.4  36.0 - 46.0 % Final   MCV 09/12/2022 83.8  80.0 - 100.0 fL Final   MCH 09/12/2022 25.9 (L)  26.0 - 34.0 pg Final   MCHC 09/12/2022 30.9  30.0 - 36.0 g/dL Final   RDW 56/43/3295 14.1  11.5 - 15.5 % Final   Platelets 09/12/2022 211  150 - 400 K/uL Final   nRBC 09/12/2022 0.0  0.0 - 0.2 % Final   Performed at [facility], [address], [place], [place] [address]   Opiates 09/12/2022 POSITIVE (A)  NONE DETECTED Final   Cocaine 09/12/2022 POSITIVE (A)   NONE DETECTED Final   Benzodiazepines 09/12/2022 POSITIVE (A)  NONE  DETECTED Final   Amphetamines 09/12/2022 NONE DETECTED  NONE DETECTED Final   Tetrahydrocannabinol 09/12/2022 NONE DETECTED  NONE DETECTED Final   Barbiturates 09/12/2022 NONE DETECTED  NONE DETECTED Final   Comment: (NOTE) DRUG SCREEN FOR MEDICAL PURPOSES ONLY.  IF CONFIRMATION IS NEEDED FOR ANY PURPOSE, NOTIFY LAB WITHIN 5 DAYS.  LOWEST DETECTABLE LIMITS FOR URINE DRUG SCREEN Drug Class                     Cutoff (ng/mL) Amphetamine and metabolites    1000 Barbiturate and metabolites    200 Benzodiazepine                 200 Opiates and metabolites        300 Cocaine and metabolites        300 THC                            50 Performed at [facility], [address], [place], [place] [address]    Preg, Serum 09/12/2022 NEGATIVE  NEGATIVE Final   Comment:        THE SENSITIVITY OF THIS METHODOLOGY IS >10 mIU/mL. Performed at [facility], [address], [place], [place] [address]    Specimen Source 09/12/2022 URINE, CLEAN CATCH   Final   Color, Urine 09/12/2022 YELLOW  YELLOW Final   APPearance 09/12/2022 CLEAR  CLEAR Final   Specific Gravity, Urine 09/12/2022 1.023  1.005 - 1.030 Final   pH 09/12/2022 6.0  5.0 - 8.0 Final   Glucose, UA 09/12/2022 NEGATIVE  NEGATIVE mg/dL Final   Hgb urine dipstick 09/12/2022 NEGATIVE  NEGATIVE Final   Bilirubin Urine 09/12/2022 NEGATIVE  NEGATIVE Final   Ketones, ur 09/12/2022 NEGATIVE  NEGATIVE mg/dL Final   Protein, ur 82/95/6213 NEGATIVE  NEGATIVE mg/dL Final   Nitrite 08/65/7846 NEGATIVE  NEGATIVE Final   Leukocytes,Ua 09/12/2022 NEGATIVE  NEGATIVE Final   RBC / HPF 09/12/2022 0-5  0 - 5 RBC/hpf Final   WBC, UA 09/12/2022 0-5  0 - 5 WBC/hpf Final   Comment:        Reflex urine culture not performed if WBC <=10, OR if Squamous epithelial cells >5. If Squamous epithelial cells >5 suggest recollection.    Bacteria, UA  09/12/2022 NONE SEEN  NONE SEEN Final   Squamous Epithelial / HPF 09/12/2022 0-5  0 - 5 /HPF Final   Mucus 09/12/2022 PRESENT   Final   Performed at [facility], [address], [place], [place] [address]    Allergies: Blueberry fruit extract, Contrast media [iodinated contrast media], Codeine, Latex, and Omnipaque [iohexol]  Medications:  Facility Ordered Medications  Medication   [COMPLETED] thiamine (VITAMIN B1) injection 100 mg   diphenhydrAMINE (BENADRYL) injection 50 mg   OLANZapine (ZYPREXA) injection 5 mg   alum & mag hydroxide-simeth (MAALOX/MYLANTA) 200-200-20 MG/5ML suspension 30 mL   magnesium hydroxide (MILK OF MAGNESIA) suspension 30 mL   hydrOXYzine (ATARAX) tablet 25 mg   traZODone (DESYREL) tablet 50 mg   dicyclomine (BENTYL) tablet 20 mg   loperamide (IMODIUM) capsule 2-4 mg   methocarbamol (ROBAXIN) tablet 500 mg   naproxen (NAPROSYN) tablet 500 mg   ondansetron (ZOFRAN-ODT) disintegrating tablet 4 mg   [START ON 11/20/2022] thiamine (VITAMIN B1) tablet 100 mg   [START ON 11/20/2022] multivitamin with minerals tablet 1 tablet   OLANZapine zydis (ZYPREXA) disintegrating tablet 10 mg   And  LORazepam (ATIVAN) tablet 1 mg   And   ziprasidone (GEODON) injection 20 mg   cloNIDine (CATAPRES) tablet 0.1 mg   Followed by   Melene Muller ON 11/21/2022] cloNIDine (CATAPRES) tablet 0.1 mg   Followed by   Melene Muller ON 11/24/2022] cloNIDine (CATAPRES) tablet 0.1 mg   thiamine (VITAMIN B1) injection 100 mg   LORazepam (ATIVAN) tablet 1 mg   LORazepam (ATIVAN) tablet 1 mg   Followed by   Melene Muller ON 11/20/2022] LORazepam (ATIVAN) tablet 1 mg   Followed by   Melene Muller ON 11/21/2022] LORazepam (ATIVAN) tablet 1 mg   Followed by   Melene Muller ON 11/23/2022] LORazepam (ATIVAN) tablet 1 mg    Long Term Goals: Improvement in symptoms so as ready for discharge  Short Term Goals: Patient will verbalize feelings in meetings with treatment team members., Patient will attend  at least of 50% of the groups daily., Pt will complete the PHQ9 on admission, day 3 and discharge., Patient will participate in completing the [place] Suicide Severity Rating Scale, Patient will score a low risk of violence for 24 hours prior to discharge, and Patient will take medications as prescribed daily.  Medical Decision Making  Psychiatric Diagnoses: Substance-induced mood disorder Opioid use disorder Benzodiazepine use disorder     Psychiatric Diagnoses and Treatment:  #Benzodiazepine use disorder -Ativan taper, started 10/23 -CIWA with Ativan as needed for CIWA greater than 10 PRNs -Thiamine 100 mg IM first day and PO after that -Multivitamin with minerals daily -Tylenol 650 mg every 6 hours as needed for pain -Zofran 4 mg every 6 hours as needed for nausea or vomiting -Imodium 2 to 4 mg as needed for diarrhea or loose stools  -Maalox/Mylanta 30 mL every 4 hours as needed for indigestion -Milk of Mag 30 mL as needed for constipation   #Opioid use disorder Opioid Use Disorder COWS Clonidine taper PRNs  -Tylenol 650 mg every 6 hours as needed for mild pain -Naproxen 500 mg BID as needed for pain -Bentyl 20 mg every 6 hours as needed for spasms/abdominal cramping -Robaxin 500 mg every 8 hours as needed for muscle spasms -Zofran 4 mg every 6 hours as needed for nausea or vomiting -Imodium 2 to 4 mg as needed for diarrhea or loose stools -Maalox/Mylanta 30 mL every 4 hours as needed for indigestion -Milk of Mag 30 mL as needed for constipation    Medical Issues Being Addressed:   Elevated LFTs Likely secondary to polysubstance use. Hepatic dosing of ativan taper.   Other PRNs: Patient agitation protocol (Ativan p.o./IM, Zyprexa Zydis, Geodon) Atarax 25 mg 3 times daily as needed   EKG pending repeat today 10/23   Disposition: pending    Recommendations  Based on my evaluation the patient does not appear to have an emergency medical condition. We will  uphold IVC at this time.  Signed:  Lorri Frederick, MD 11/19/22  12:09 PM

## 2022-11-19 NOTE — ED Notes (Signed)
Pt sleeping at present, arouseable to verbal stimuli.  No distress noted.  Monitoring for safety.

## 2022-11-19 NOTE — ED Notes (Signed)
Pt A&O x 4, resting on sofa at [employer].  Pt tearful, crying at present.

## 2022-11-19 NOTE — ED Notes (Signed)
Pt escorted to [facility] accompanied by [employer] [facility] RN Annemarie, agitated, upset and crying, pt under IVC, adamant that she wants to go home.  Dr Lucianne Muss & residents escorted pt to private room to speak with pt.  PRN meds given, Zyprexa and Ativan.  Pt took PO meds without incident.  Monitoring for safety.

## 2022-11-19 NOTE — Progress Notes (Signed)
Pt requested snacks and something to drink.  She has been allowed for ONE TIME ONLY - to eat her snacks in her room - due to receiving PRN medications with sedating properties that make her a high fall risk.

## 2022-11-19 NOTE — BH IP Treatment Plan (Signed)
Interdisciplinary Treatment and Diagnostic Plan Update  11/19/2022 Time of Session: 11:05AM [name] MRN: [id number]  Diagnosis:  Final diagnoses:  Substance induced mood disorder (HCC)  Opioid type dependence, continuous (HCC)  Mild benzodiazepine use disorder (HCC)     Current Medications:  Current Facility-Administered Medications  Medication Dose Route Frequency Provider Last Rate Last Admin   alum & mag hydroxide-simeth (MAALOX/MYLANTA) 200-200-20 MG/5ML suspension 30 mL  30 mL Oral Q4H PRN White, Patrice L, NP       cloNIDine (CATAPRES) tablet 0.1 mg  0.1 mg Oral QID Carrion-Carrero, Margely, MD       Followed by   Melene Muller ON 11/21/2022] cloNIDine (CATAPRES) tablet 0.1 mg  0.1 mg Oral BH-qamhs Carrion-Carrero, Margely, MD       Followed by   Melene Muller ON 11/24/2022] cloNIDine (CATAPRES) tablet 0.1 mg  0.1 mg Oral QAC breakfast Carrion-Carrero, Margely, MD       dicyclomine (BENTYL) tablet 20 mg  20 mg Oral Q6H PRN White, Patrice L, NP       diphenhydrAMINE (BENADRYL) injection 50 mg  50 mg Intramuscular Q12H PRN White, Patrice L, NP       hydrOXYzine (ATARAX) tablet 25 mg  25 mg Oral TID PRN White, Patrice L, NP       loperamide (IMODIUM) capsule 2-4 mg  2-4 mg Oral PRN White, Patrice L, NP       OLANZapine zydis (ZYPREXA) disintegrating tablet 10 mg  10 mg Oral Q8H PRN Carrion-Carrero, Margely, MD       And   LORazepam (ATIVAN) tablet 1 mg  1 mg Oral PRN Carrion-Carrero, Margely, MD       And   ziprasidone (GEODON) injection 20 mg  20 mg Intramuscular PRN Carrion-Carrero, Margely, MD       LORazepam (ATIVAN) tablet 1 mg  1 mg Oral Q6H PRN Carrion-Carrero, Margely, MD   1 mg at 11/19/22 1105   LORazepam (ATIVAN) tablet 1 mg  1 mg Oral QID Carrion-Carrero, Margely, MD       Followed by   Melene Muller ON 11/20/2022] LORazepam (ATIVAN) tablet 1 mg  1 mg Oral TID Carrion-Carrero, Margely, MD       Followed by   Melene Muller ON 11/21/2022] LORazepam (ATIVAN) tablet 1 mg  1 mg Oral BID  Carrion-Carrero, Margely, MD       Followed by   Melene Muller ON 11/23/2022] LORazepam (ATIVAN) tablet 1 mg  1 mg Oral Daily Carrion-Carrero, Margely, MD       magnesium hydroxide (MILK OF MAGNESIA) suspension 30 mL  30 mL Oral Daily PRN White, Patrice L, NP       methocarbamol (ROBAXIN) tablet 500 mg  500 mg Oral Q8H PRN White, Patrice L, NP   500 mg at 11/19/22 1205   [START ON 11/20/2022] multivitamin with minerals tablet 1 tablet  1 tablet Oral Daily White, Patrice L, NP       naproxen (NAPROSYN) tablet 500 mg  500 mg Oral BID PRN White, Patrice L, NP   500 mg at 11/19/22 1205   ondansetron (ZOFRAN-ODT) disintegrating tablet 4 mg  4 mg Oral Q6H PRN White, Patrice L, NP       [START ON 11/20/2022] thiamine (VITAMIN B1) tablet 100 mg  100 mg Oral Daily White, Patrice L, NP       traZODone (DESYREL) tablet 50 mg  50 mg Oral QHS PRN White, Patrice L, NP       No current outpatient medications on file.  PTA Medications: Prior to Admission medications   Not on File    Patient Stressors: Legal issue   Loss of employment   Marital or family conflict   Substance abuse    Patient Strengths: Ability for insight  Average or above average intelligence  Capable of independent living  Communication skills  General fund of knowledge  Supportive family/friends  Work skills   Treatment Modalities: Medication Management, Group therapy, Case management,  1 to 1 session with clinician, Psychoeducation, Recreational therapy.   Physician Treatment Plan for Primary and Secondary Diagnosis:  Final diagnoses:  Substance induced mood disorder (HCC)  Opioid type dependence, continuous (HCC)  Mild benzodiazepine use disorder (HCC)   Long Term Goal(s): Improvement in symptoms so as ready for discharge  Short Term Goals: Patient will verbalize feelings in meetings with treatment team members. Patient will attend at least of 50% of the groups daily. Pt will complete the PHQ9 on admission, day 3 and  discharge. Patient will participate in completing the [place] Suicide Severity Rating Scale Patient will score a low risk of violence for 24 hours prior to discharge Patient will take medications as prescribed daily.  Medication Management: Evaluate patient's response, side effects, and tolerance of medication regimen.  Therapeutic Interventions: 1 to 1 sessions, Unit Group sessions and Medication administration.  Evaluation of Outcomes: Progressing  LCSW Treatment Plan for Primary Diagnosis:  Final diagnoses:  Substance induced mood disorder (HCC)  Opioid type dependence, continuous (HCC)  Mild benzodiazepine use disorder (HCC)    Long Term Goal(s): Safe transition to appropriate next level of care at discharge.  Short Term Goals: Facilitate acceptance of mental health diagnosis and concerns through verbal commitment to aftercare plan and appointments at discharge., Patient will identify one social support prior to discharge to aid in patient's recovery., Patient will attend AA/NA groups as scheduled., Identify minimum of 2 triggers associated with mental health/substance abuse issues with treatment team members., and Increase skills for wellness and recovery by attending 50% of scheduled groups.  Therapeutic Interventions: Assess for all discharge needs, 1 to 1 time with Child psychotherapist, Explore available resources and support systems, Assess for adequacy in community support network, Educate family and significant other(s) on suicide prevention, Complete Psychosocial Assessment, Interpersonal group therapy.  Evaluation of Outcomes: Progressing   Progress in Treatment: Attending groups: Patient is new to milieu and has been encouraged to participate in programming on unit.  Participating in groups: Patient is new to milieu and has been encouraged to participate in programming on unit.  Taking medication as prescribed: Yes. Toleration medication: Yes. Family/Significant other contact  made: Yes, individual(s) contacted:  Mother [name] [phone] Patient understands diagnosis: Yes. Discussing patient identified problems/goals with staff: No. Medical problems stabilized or resolved: No. Denies suicidal/homicidal ideation: Yes. Issues/concerns per patient self-inventory: Yes. Other: Family conflict; IVC process   New problem(s) identified: No, Describe:  other than reported on admission.   New Short Term/Long Term Goal(s): Safe transition to appropriate next level of care at discharge, Engage patient in therapeutic group addressing interpersonal concerns. Engage patient in aftercare planning with referrals and resources, Increase ability to appropriately verbalize feelings, Facilitate acceptance of mental health diagnosis and concerns and Identify triggers associated with mental health/substance abuse issues.   Patient Goals:  Patient is adamant about discharging. Patient reports seeing no purpose of being at the [facility]. Patient would like to return home and reports no interest in medication such as Suboxone or Subutex.   Discharge Plan or Barriers: Treatment team  discussed the plan at this time.  Treatment team will follow up with patient on tomorrow once she is more compliant.  LCSW will follow up with the patient to see if she provides permission to reach out to [employer].  LCSW will continue to follow and provide support to patient while on [facility] unit.  Patient has been encouraged to participate in programming on the unit.  Patient aware of staff's support during stay in [facility].   Reason for Continuation of Hospitalization: Medication stabilization Withdrawal symptoms Other; describe IVC  Estimated Length of Stay: 3-5 days  Last 3 [place] Suicide Severity Risk Score: Flowsheet Row ED from 11/19/2022 in [facility] ED from 11/18/2022 in [facility] ED from 10/08/2022 in [facility]  C-SSRS  RISK CATEGORY High Risk No Risk No Risk       Last PHQ 2/9 Scores:    11/19/2022   11:37 AM 10/10/2022    3:20 PM 10/08/2022   11:35 PM  Depression screen PHQ 2/9  Decreased Interest  3 3  Down, Depressed, Hopeless 1 3 3   PHQ - 2 Score 1 6 6   Altered sleeping  3 3  Tired, decreased energy  3 3  Change in appetite  3 3  Feeling bad or failure about yourself   0 0  Trouble concentrating  0 0  Moving slowly or fidgety/restless  0 0  Suicidal thoughts  0 0  PHQ-9 Score  15 15    Scribe for Treatment Team: Loleta Dicker, LCSW 11/19/2022 1:22 PM

## 2022-11-19 NOTE — Tx Team (Signed)
Patient is known to this provider. LCSW, MD, and Resident met with patient to assess current mood, affect, physical state, and inquire about needs/goals while here in [facility] and after discharge.  Patient reported a great deal of frustration being at the [facility].  Patient reports she was IVC'd by her mother or grandmother for no reason. Patient reports she feels like her mother is trying to control her life and make her miserable.  Patient admitted to only using Xanax at this time. Patient reports she has not been using heroin or fentanyl, as these were the substances of choice during last admission. UDS was negative for heroin, however positive for Xanax. UDS does not test for fentanyl.  Patient reports she does not understand why she is here.  Patient reports she does not want to be on medication, and is not interested in Suboxone or Subutex. Patient was adamant about being discharged. Patient was informed that she is currently IVC'd and additional collateral would need to be gained in order to determine outcome. Patient provided treatment team with permission to contact her mother [name], however reports she did not know her new number. Team utilized number listed in patient's chart. Brief counseling was provided to the patient regarding next steps. Patient reports being willing to work with team as collateral is gained. Patient was escorted to room by the Charge RN. No other needs were reported at this time.  Treatment team contacted the patient's Mother [name] contact number is [phone].  Mother reports she is aware of the patient being at the [facility].  Mother reports concerns regarding the patient's behavior within the last few days. Mother reports patient and her younger sister has been using substances together. Mother reports believing that the patient had a recent overdose on Sunday due to nodding, sleeping, and not being able to tend to themselves. Mother reports the patient was not even aware that mother was in  the home. Mother reports being afraid that something was going to happen to the patient due to her behavior and the way she was acting. Mother reports spending about 15 minutes attempting to get the patient to open her eyes. Mother reports due to fear, she and the grandmother had the patient IVC'd for further assistance.  Mother reports the patient and her 69 year old brother live in the same home. Mother reports the patient is the oldest out of her siblings. Mother reports she believes the patient has been using fentanyl, heroin, and Xanax. Mother reports the patient has legal issues against her at this time.  Mother reports the patient has missed appointments, violated probation, and even lost her job over substance use. Mother reports the patient has 2 options, treatment or following up with probation officer as she has active warrants at this time. Mother provided the name of probation officer: Constance Haw and number is [phone]. Mother aware that treatment team will follow up with updates once received. No other concerns were reported at this time by mother.  Treatment team discussed the plan at this time.  Treatment team will follow up with patient on tomorrow once she is more compliant.  LCSW will follow up with the patient to see if she provides permission to reach out to [employer].  LCSW will continue to follow and provide support to patient while on [facility] unit.  Patient has been encouraged to participate in programming on the unit.  Patient aware of staff's support during stay in [facility].  No other needs to report at this time.  Fernande Boyden, LCSW Clinical Social Worker [place] [facility] Ph: [phone]

## 2022-11-19 NOTE — ED Notes (Signed)
Patient is complaining of really bad teeth pain. This nurse advised the patient that the Naproxen she has on file is only allowed twice a day which is every 12 hours and her last dose was at 1205 pm. I advised she wouldn't be allowed another dose until after 12 am. The patient stated she is in really bad teeth pain. This nurse has made the provider aware.

## 2022-11-19 NOTE — ED Provider Notes (Signed)
FBC/OBS ASAP Discharge Summary  Date and Time: 11/19/2022 10:22 AM  Name: [name]  MRN:  [id number]   Discharge Diagnoses:  Final diagnoses:  Involuntary commitment  Substance abuse [facility])  Anxiety    Subjective: Patient seen and evaluated face-to-face by this provider, chart reviewed and case discussed with Dr. Lucianne Muss. On evaluation, patient is lying down in bed in no acute distress. She is alert and oriented x 4. Her thought process is linear and speech is clear and coherent. Her mood is irritable and affect is congruent. She is guarded, withdrawn and forwards little information.  Patient states that she is here because her mom thinks that she is a danger to herself because her mom saw her taking a Klonopin yesterday. She states that she took Klonopin 1 mg pill yesterday. She is unable to quantify how much Klonopin she uses and states, "I can't tell you that." He denies recent opiate or cocaine use and states that she can't tell me the last time she used. UDS positive for benzodiazepines on arrival. She denies drinking alcohol. She denies withdrawal symptoms.   She denies suicidal ideations. She denies past suicide attempts. She denies making suicidal statements or stating that she wanted to die. She denies recent overdose of drugs. She denies homicidal ideations. She denies auditory or visual hallucinations. She is unable to describe her mood. She states that her sleep is not good but does not further elaborate. She states that her appetite has not been good since she has been here.  Patient denies outpatient psychiatry or therapy. Patient lives alone. Patient states that she has a 68 year old son in DSS  custody. Patient is unemployed.   I attempted to call the IVC petitioner Mrs. Pierce two times this morning. No answer, left HIPAA voicemail.  Stay Summary: Per chart review, Per IVC paperwork: "Respondent has been diagnosed with depression. Derrionna is currently having an episode which  caused an influx in drug use. She recently OD on Sunday and refused to be transported to the hospital. Respondent has stated several times that she wants to die. She is using heroine, Xanax and Fentanyl."   Total Time spent with patient: 20 minutes  Past Psychiatric History: History of opioid use disorder, MDD, GAD, and ADHD.  Past Medical History: Patient denies medical history. Patient denies taking prescribed medications.   Family Psychiatric History: Per chart review, mother history of depression and, (M) grandfather hx of mental illness, and sister history of bipolar  Social History: Patient lives alone. Patient states that she has a 10 year old son in DSS  custody. Patient is unemployed.   Tobacco Cessation:  N/A, patient does not currently use tobacco products  Current Medications:  Current Facility-Administered Medications  Medication Dose Route Frequency Provider Last Rate Last Admin   acetaminophen (TYLENOL) tablet 650 mg  650 mg Oral Q6H PRN Onuoha, Chinwendu V, NP       alum & mag hydroxide-simeth (MAALOX/MYLANTA) 200-200-20 MG/5ML suspension 30 mL  30 mL Oral Q4H PRN Onuoha, Chinwendu V, NP       dicyclomine (BENTYL) tablet 20 mg  20 mg Oral Q6H PRN Onuoha, Chinwendu V, NP       hydrOXYzine (ATARAX) tablet 25 mg  25 mg Oral Q6H PRN Onuoha, Chinwendu V, NP       loperamide (IMODIUM) capsule 2-4 mg  2-4 mg Oral PRN Onuoha, Chinwendu V, NP       LORazepam (ATIVAN) tablet 1 mg  1 mg Oral  Q6H PRN Navy Belay L, NP       magnesium hydroxide (MILK OF MAGNESIA) suspension 30 mL  30 mL Oral Daily PRN Onuoha, Chinwendu V, NP       methocarbamol (ROBAXIN) tablet 500 mg  500 mg Oral Q8H PRN Onuoha, Chinwendu V, NP       multivitamin with minerals tablet 1 tablet  1 tablet Oral Daily Zetta Stoneman L, NP   1 tablet at 11/19/22 0933   naproxen (NAPROSYN) tablet 500 mg  500 mg Oral BID PRN Onuoha, Chinwendu V, NP       ondansetron (ZOFRAN-ODT) disintegrating tablet 4 mg  4 mg Oral Q6H PRN  Onuoha, Chinwendu V, NP       [START ON 11/20/2022] thiamine (VITAMIN B1) tablet 100 mg  100 mg Oral Daily Talbert Trembath L, NP       No current outpatient medications on file.    PTA Medications:  Facility Ordered Medications  Medication   acetaminophen (TYLENOL) tablet 650 mg   alum & mag hydroxide-simeth (MAALOX/MYLANTA) 200-200-20 MG/5ML suspension 30 mL   magnesium hydroxide (MILK OF MAGNESIA) suspension 30 mL   dicyclomine (BENTYL) tablet 20 mg   hydrOXYzine (ATARAX) tablet 25 mg   loperamide (IMODIUM) capsule 2-4 mg   methocarbamol (ROBAXIN) tablet 500 mg   naproxen (NAPROSYN) tablet 500 mg   ondansetron (ZOFRAN-ODT) disintegrating tablet 4 mg   [COMPLETED] thiamine (VITAMIN B1) injection 100 mg   [START ON 11/20/2022] thiamine (VITAMIN B1) tablet 100 mg   multivitamin with minerals tablet 1 tablet   LORazepam (ATIVAN) tablet 1 mg       10/10/2022    3:20 PM 10/08/2022   11:35 PM 03/12/2021   11:58 AM  Depression screen PHQ 2/9  Decreased Interest 3 3 1   Down, Depressed, Hopeless 3 3 2   PHQ - 2 Score 6 6 3   Altered sleeping 3 3 1   Tired, decreased energy 3 3 1   Change in appetite 3 3 2   Feeling bad or failure about yourself  0 0 0  Trouble concentrating 0 0 0  Moving slowly or fidgety/restless 0 0 0  Suicidal thoughts 0 0 0  PHQ-9 Score 15 15 7   Difficult doing work/chores   Not difficult at all    Flowsheet Row ED from 11/18/2022 in [facility] Most recent reading at 11/18/2022 10:18 PM ED from 10/08/2022 in [facility] Most recent reading at 10/09/2022 12:55 AM ED from 10/08/2022 in [facility] Most recent reading at 10/08/2022  9:38 PM  C-SSRS RISK CATEGORY No Risk No Risk No Risk       Musculoskeletal  Strength & Muscle Tone: within normal limits Gait & Station: normal Patient leans: N/A  Psychiatric Specialty Exam  Presentation  General Appearance:  Casual  Eye  Contact: Fair  Speech: Slow; Clear and Coherent  Speech Volume: Normal  Handedness: Right   Mood and Affect  Mood: Depressed  Affect: Congruent   Thought Process  Thought Processes: Coherent  Descriptions of Associations:Intact  Orientation:Full (Time, Place and Person)  Thought Content:WDL  Diagnosis of Schizophrenia or Schizoaffective disorder in past: No    Hallucinations:Hallucinations: None  Ideas of Reference:None  Suicidal Thoughts:Suicidal Thoughts: No  Homicidal Thoughts:Homicidal Thoughts: No   Sensorium  Memory: Immediate Fair  Judgment: Poor  Insight: Poor   Executive Functions  Concentration: Fair  Attention Span: Fair  Recall: Fair  Fund of Knowledge: Fair  Language: Fair  Psychomotor Activity  Psychomotor Activity: Psychomotor Activity: Normal   Assets  Assets: Communication Skills; Desire for Improvement   Sleep  Sleep: Sleep: Fair   Nutritional Assessment (For OBS and FBC admissions only) Has the patient had a weight loss or gain of 10 pounds or more in the last 3 months?: No Has the patient had a decrease in food intake/or appetite?: No Does the patient have dental problems?: No Does the patient have eating habits or behaviors that may be indicators of an eating disorder including binging or inducing vomiting?: No Has the patient recently lost weight without trying?: 0 Has the patient been eating poorly because of a decreased appetite?: 0 Malnutrition Screening Tool Score: 0    Physical Exam  Physical Exam HENT:     Head: Normocephalic.     Nose: Nose normal.  Eyes:     Conjunctiva/sclera: Conjunctivae normal.  Cardiovascular:     Rate and Rhythm: Normal rate.  Pulmonary:     Effort: Pulmonary effort is normal.  Musculoskeletal:        General: Normal range of motion.     Cervical back: Normal range of motion.  Neurological:     Mental Status: She is alert and oriented to person, place,  and time.    Review of Systems  Constitutional: Negative.   HENT: Negative.    Eyes: Negative.   Respiratory: Negative.    Cardiovascular: Negative.   Gastrointestinal: Negative.   Genitourinary: Negative.   Musculoskeletal: Negative.   Neurological: Negative.   Endo/Heme/Allergies: Negative.    Blood pressure 104/63, pulse 87, temperature 98.5 F (36.9 C), temperature source Oral, resp. rate 18, SpO2 97%. There is no height or weight on file to calculate BMI.   Plan Of Care/Follow-up recommendations:    Disposition: Transfer to [facility]. First exam completed and given to [facility], Charity fundraiser.  Suhaylah Wampole L, NP 11/19/2022, 10:22 AM
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
## 2022-11-20 DIAGNOSIS — B998 Other infectious disease: Secondary | ICD-10-CM | POA: Diagnosis not present

## 2022-11-20 DIAGNOSIS — F1120 Opioid dependence, uncomplicated: Secondary | ICD-10-CM | POA: Diagnosis not present

## 2022-11-20 DIAGNOSIS — F1994 Other psychoactive substance use, unspecified with psychoactive substance-induced mood disorder: Secondary | ICD-10-CM | POA: Diagnosis not present

## 2022-11-20 DIAGNOSIS — F1310 Sedative, hypnotic or anxiolytic abuse, uncomplicated: Secondary | ICD-10-CM | POA: Diagnosis not present

## 2022-11-20 MED ORDER — AMOXICILLIN-POT CLAVULANATE 875-125 MG PO TABS
1.0000 | ORAL_TABLET | Freq: Two times a day (BID) | ORAL | Status: DC
  Administered 2022-11-20 – 2022-11-24 (×9): 1 via ORAL
  Filled 2022-11-20 (×9): qty 1

## 2022-11-20 MED ORDER — IBUPROFEN 600 MG PO TABS
600.0000 mg | ORAL_TABLET | Freq: Four times a day (QID) | ORAL | Status: DC | PRN
  Administered 2022-11-20 – 2022-11-21 (×3): 600 mg via ORAL
  Filled 2022-11-20 (×3): qty 1

## 2022-11-20 MED ORDER — BENZOCAINE 10 % MT GEL
1.0000 | Freq: Once | OROMUCOSAL | Status: AC
  Administered 2022-11-20: 1 via OROMUCOSAL
  Filled 2022-11-20: qty 9
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
## 2022-11-20 NOTE — ED Notes (Signed)
Pt awake & resting at present, calm, cooperative, no acute distress noted.  Monitoring for safety.

## 2022-11-20 NOTE — ED Notes (Signed)
Pt is currently sleeping, no distress noted, environmental check complete, will continue to monitor patient for safety.  

## 2022-11-20 NOTE — ED Notes (Signed)
Pt was provided dinner.

## 2022-11-20 NOTE — Progress Notes (Signed)
Pt stayed in her room this shift but woke up for meals. Pt complained of toothache and was medicated per order. No other concerns voiced. Staff will monitor for pt's safety.

## 2022-11-20 NOTE — ED Notes (Signed)
Patient complaint of teeth pain

## 2022-11-20 NOTE — ED Provider Notes (Addendum)
Behavioral Health Progress Note  Date and Time: 11/20/2022 11:09 AM Name: [name] MRN:  [id number]   Reason for admission: [name] is a 27 yo female with a hx of prior IVCs,  polysubstance use including opioid use disorder with previous ODs, benzodiazepine use disorder,  and stimulant use disorder (cocaine, meth) presenting to [facility] on 11/19/2022 under IVC, petitioned by mother, for concerns of imminent danger to self in setting of impulsivity and increase drug use. The patient is declining subutex or suboxone. UDS on admission is only positive for benzodiazepines. All other toxiciology screening is negative.   Subjective:   Patient appeared irritable and participated minimally in the assessment. She reports drowsiness and fatigue, denied suicidal ideations, homicidal ideations, and auditory/visual hallucinations.  Per RN, patient has been maintaining adequate PO intake, with no acute events overnight. Per RN, patient has been having difficulty eating her food due to an ongoing odontogenic infection. Pt reported receiving abx treating a couple of weeks ago but did not complete the course.   Diagnosis:  Final diagnoses:  Substance induced mood disorder (HCC)  Opioid type dependence, continuous (HCC)  Mild benzodiazepine use disorder (HCC)    Total Time spent with patient: 15 minutes   Substance Use History: Extensive hx fentanyl, heroin, xanax, cocaine, alcohol on chart review. Pt has previously been on suboxone and methadone. Pt's mother reports a recent this month, other overdoses identified in March 2024 requiring narcan x2 (Pt was accepted to [facility] under IVC at that time), also in 2022. There is documented use of ectasy in past.   Past Psychiatric History:  Remote hx of suicide attempt by overdosing on klonopin at age 11.  5 prior psychiatric hospitalizations identified on chart review.  --[facility] hospitalization in March 2019 fo SI in setting of polysubstance use.   Medication trials: Previously treated with zoloft at age 14, which made her more irritable. Has also previously trialed Prozac, Wellbutrin, and Seroquel.   Past Medical History:   Family History: Unable to obtain from pt due to limited participation, see Hx tab.  Family Psychiatric  History: Unable to obtain from pt due to limited participation, see Hx tab.  Social History:  Lives with brother in [place]. Legal hx: Currently on parole. Has hx of prior incarcerations.     Current Medications:  Current Facility-Administered Medications  Medication Dose Route Frequency Provider Last Rate Last Admin   alum & mag hydroxide-simeth (MAALOX/MYLANTA) 200-200-20 MG/5ML suspension 30 mL  30 mL Oral Q4H PRN White, Patrice L, NP       cloNIDine (CATAPRES) tablet 0.1 mg  0.1 mg Oral QID Carrion-Carrero, Demontay Grantham, MD   0.1 mg at 11/20/22 0809   Followed by   Melene Muller ON 11/21/2022] cloNIDine (CATAPRES) tablet 0.1 mg  0.1 mg Oral BH-qamhs Carrion-Carrero, Niamya Vittitow, MD       Followed by   Melene Muller ON 11/24/2022] cloNIDine (CATAPRES) tablet 0.1 mg  0.1 mg Oral QAC breakfast Carrion-Carrero, Tramel Westbrook, MD       dicyclomine (BENTYL) tablet 20 mg  20 mg Oral Q6H PRN White, Patrice L, NP       diphenhydrAMINE (BENADRYL) injection 50 mg  50 mg Intramuscular Q12H PRN White, Patrice L, NP       hydrOXYzine (ATARAX) tablet 25 mg  25 mg Oral TID PRN White, Patrice L, NP       loperamide (IMODIUM) capsule 2-4 mg  2-4 mg Oral PRN White, Patrice L, NP       OLANZapine  zydis (ZYPREXA) disintegrating tablet 10 mg  10 mg Oral Q8H PRN Carrion-Carrero, Jerrilyn Messinger, MD       And   LORazepam (ATIVAN) tablet 1 mg  1 mg Oral PRN Carrion-Carrero, Consuelo Suthers, MD       And   ziprasidone (GEODON) injection 20 mg  20 mg Intramuscular PRN Carrion-Carrero, Tykel Badie, MD       LORazepam (ATIVAN) tablet 1 mg  1 mg Oral Q6H PRN Carrion-Carrero, Trayson Stitely, MD   1 mg at 11/19/22 1105   LORazepam (ATIVAN) tablet 1 mg  1 mg Oral TID Carrion-Carrero, Karle Starch,  MD       Followed by   Melene Muller ON 11/21/2022] LORazepam (ATIVAN) tablet 1 mg  1 mg Oral BID Carrion-Carrero, Aasha Dina, MD       Followed by   Melene Muller ON 11/23/2022] LORazepam (ATIVAN) tablet 1 mg  1 mg Oral Daily Carrion-Carrero, Ayianna Darnold, MD       magnesium hydroxide (MILK OF MAGNESIA) suspension 30 mL  30 mL Oral Daily PRN White, Patrice L, NP       methocarbamol (ROBAXIN) tablet 500 mg  500 mg Oral Q8H PRN White, Patrice L, NP   500 mg at 11/19/22 1205   multivitamin with minerals tablet 1 tablet  1 tablet Oral Daily White, Patrice L, NP   1 tablet at 11/20/22 0810   naproxen (NAPROSYN) tablet 500 mg  500 mg Oral BID PRN White, Patrice L, NP   500 mg at 11/20/22 0506   ondansetron (ZOFRAN-ODT) disintegrating tablet 4 mg  4 mg Oral Q6H PRN White, Patrice L, NP       thiamine (VITAMIN B1) tablet 100 mg  100 mg Oral Daily White, Patrice L, NP   100 mg at 11/20/22 0809   traZODone (DESYREL) tablet 50 mg  50 mg Oral QHS PRN White, Patrice L, NP   50 mg at 11/19/22 2138   No current outpatient medications on file.    Labs  Lab Results:  Admission on 11/18/2022, Discharged on 11/19/2022  Component Date Value Ref Range Status   WBC 11/18/2022 3.9 (L)  4.0 - 10.5 K/uL Final   RBC 11/18/2022 4.58  3.87 - 5.11 MIL/uL Final   Hemoglobin 11/18/2022 11.8 (L)  12.0 - 15.0 g/dL Final   HCT 16/10/9602 37.7  36.0 - 46.0 % Final   MCV 11/18/2022 82.3  80.0 - 100.0 fL Final   MCH 11/18/2022 25.8 (L)  26.0 - 34.0 pg Final   MCHC 11/18/2022 31.3  30.0 - 36.0 g/dL Final   RDW 54/09/8117 14.0  11.5 - 15.5 % Final   Platelets 11/18/2022 173  150 - 400 K/uL Final   nRBC 11/18/2022 0.0  0.0 - 0.2 % Final   Neutrophils Relative % 11/18/2022 40  % Final   Neutro Abs 11/18/2022 1.5 (L)  1.7 - 7.7 K/uL Final   Lymphocytes Relative 11/18/2022 42  % Final   Lymphs Abs 11/18/2022 1.6  0.7 - 4.0 K/uL Final   Monocytes Relative 11/18/2022 7  % Final   Monocytes Absolute 11/18/2022 0.3  0.1 - 1.0 K/uL Final    Eosinophils Relative 11/18/2022 10  % Final   Eosinophils Absolute 11/18/2022 0.4  0.0 - 0.5 K/uL Final   Basophils Relative 11/18/2022 1  % Final   Basophils Absolute 11/18/2022 0.0  0.0 - 0.1 K/uL Final   Immature Granulocytes 11/18/2022 0  % Final   Abs Immature Granulocytes 11/18/2022 0.01  0.00 - 0.07 K/uL Final   Performed at  [facility], [address] [place], [place] [address]   Sodium 11/18/2022 142  135 - 145 mmol/L Final   Potassium 11/18/2022 4.0  3.5 - 5.1 mmol/L Final   Chloride 11/18/2022 107  98 - 111 mmol/L Final   CO2 11/18/2022 26  22 - 32 mmol/L Final   Glucose, Bld 11/18/2022 74  70 - 99 mg/dL Final   Glucose reference range applies only to samples taken after fasting for at least 8 hours.   BUN 11/18/2022 10  6 - 20 mg/dL Final   Creatinine, Ser 11/18/2022 0.64  0.44 - 1.00 mg/dL Final   Calcium 60/45/4098 8.6 (L)  8.9 - 10.3 mg/dL Final   Total Protein 11/91/4782 5.7 (L)  6.5 - 8.1 g/dL Final   Albumin 95/62/1308 3.6  3.5 - 5.0 g/dL Final   AST 65/78/4696 82 (H)  15 - 41 U/L Final   ALT 11/18/2022 57 (H)  0 - 44 U/L Final   Alkaline Phosphatase 11/18/2022 69  38 - 126 U/L Final   Total Bilirubin 11/18/2022 0.4  0.3 - 1.2 mg/dL Final   GFR, Estimated 11/18/2022 >60  >60 mL/min Final   Comment: (NOTE) Calculated using the CKD-EPI Creatinine Equation (2021)    Anion gap 11/18/2022 9  5 - 15 Final   Performed at [facility], [address], [place], [place] [address]   Preg Test, Ur 11/18/2022 Negative  Negative Final   POC Amphetamine UR 11/18/2022 None Detected  NONE DETECTED (Cut Off Level 1000 ng/mL) Final   POC Secobarbital (BAR) 11/18/2022 None Detected  NONE DETECTED (Cut Off Level 300 ng/mL) Final   POC Buprenorphine (BUP) 11/18/2022 None Detected  NONE DETECTED (Cut Off Level 10 ng/mL) Final   POC Oxazepam (BZO) 11/18/2022 Positive (A)  NONE DETECTED (Cut Off Level 300 ng/mL) Final   POC Cocaine UR 11/18/2022 None Detected  NONE DETECTED  (Cut Off Level 300 ng/mL) Final   POC Methamphetamine UR 11/18/2022 None Detected  NONE DETECTED (Cut Off Level 1000 ng/mL) Final   POC Morphine 11/18/2022 None Detected  NONE DETECTED (Cut Off Level 300 ng/mL) Final   POC Methadone UR 11/18/2022 None Detected  NONE DETECTED (Cut Off Level 300 ng/mL) Final   POC Oxycodone UR 11/18/2022 None Detected  NONE DETECTED (Cut Off Level 100 ng/mL) Final   POC Marijuana UR 11/18/2022 None Detected  NONE DETECTED (Cut Off Level 50 ng/mL) Final  Admission on 10/08/2022, Discharged on 10/10/2022  Component Date Value Ref Range Status   WBC 10/09/2022 3.6 (L)  4.0 - 10.5 K/uL Final   RBC 10/09/2022 4.83  3.87 - 5.11 MIL/uL Final   Hemoglobin 10/09/2022 12.4  12.0 - 15.0 g/dL Final   HCT 41/32/4401 39.2  36.0 - 46.0 % Final   MCV 10/09/2022 81.2  80.0 - 100.0 fL Final   MCH 10/09/2022 25.7 (L)  26.0 - 34.0 pg Final   MCHC 10/09/2022 31.6  30.0 - 36.0 g/dL Final   RDW 02/72/5366 14.3  11.5 - 15.5 % Final   Platelets 10/09/2022 190  150 - 400 K/uL Final   nRBC 10/09/2022 0.0  0.0 - 0.2 % Final   Neutrophils Relative % 10/09/2022 43  % Final   Neutro Abs 10/09/2022 1.5 (L)  1.7 - 7.7 K/uL Final   Lymphocytes Relative 10/09/2022 45  % Final   Lymphs Abs 10/09/2022 1.6  0.7 - 4.0 K/uL Final   Monocytes Relative 10/09/2022 7  % Final  Monocytes Absolute 10/09/2022 0.2  0.1 - 1.0 K/uL Final   Eosinophils Relative 10/09/2022 4  % Final   Eosinophils Absolute 10/09/2022 0.1  0.0 - 0.5 K/uL Final   Basophils Relative 10/09/2022 1  % Final   Basophils Absolute 10/09/2022 0.0  0.0 - 0.1 K/uL Final   Immature Granulocytes 10/09/2022 0  % Final   Abs Immature Granulocytes 10/09/2022 0.00  0.00 - 0.07 K/uL Final   Performed at [facility], [address], [place], [place] [address]   Sodium 10/09/2022 141  135 - 145 mmol/L Final   Potassium 10/09/2022 4.6  3.5 - 5.1 mmol/L Final   Chloride 10/09/2022 101  98 - 111 mmol/L Final   CO2 10/09/2022 27  22 -  32 mmol/L Final   Glucose, Bld 10/09/2022 80  70 - 99 mg/dL Final   Glucose reference range applies only to samples taken after fasting for at least 8 hours.   BUN 10/09/2022 8  6 - 20 mg/dL Final   Creatinine, Ser 10/09/2022 0.70  0.44 - 1.00 mg/dL Final   Calcium 91/47/8295 9.3  8.9 - 10.3 mg/dL Final   Total Protein 62/13/0865 6.0 (L)  6.5 - 8.1 g/dL Final   Albumin 78/46/9629 4.0  3.5 - 5.0 g/dL Final   AST 52/84/1324 16  15 - 41 U/L Final   ALT 10/09/2022 14  0 - 44 U/L Final   Alkaline Phosphatase 10/09/2022 48  38 - 126 U/L Final   Total Bilirubin 10/09/2022 0.8  0.3 - 1.2 mg/dL Final   GFR, Estimated 10/09/2022 >60  >60 mL/min Final   Comment: (NOTE) Calculated using the CKD-EPI Creatinine Equation (2021)    Anion gap 10/09/2022 13  5 - 15 Final   Performed at [facility], [address] [place], [place] [address]   Alcohol, Ethyl (B) 10/09/2022 <10  <10 mg/dL Final   Comment: (NOTE) Lowest detectable limit for serum alcohol is 10 mg/dL.  For medical purposes only. Performed at [facility], [address], [place], [place] [address]    TSH 10/09/2022 0.553  0.350 - 4.500 uIU/mL Final   Comment: Performed by a 3rd Generation assay with a functional sensitivity of <=0.01 uIU/mL. Performed at [facility], [address], [place], [place] [address]    Preg Test, Ur 10/10/2022 Negative  Negative Final   POC Amphetamine UR 10/10/2022 None Detected  NONE DETECTED (Cut Off Level 1000 ng/mL) Final   POC Secobarbital (BAR) 10/10/2022 None Detected  NONE DETECTED (Cut Off Level 300 ng/mL) Final   POC Buprenorphine (BUP) 10/10/2022 None Detected  NONE DETECTED (Cut Off Level 10 ng/mL) Final   POC Oxazepam (BZO) 10/10/2022 None Detected  NONE DETECTED (Cut Off Level 300 ng/mL) Final   POC Cocaine UR 10/10/2022 Positive (A)  NONE DETECTED (Cut Off Level 300 ng/mL) Final   POC Methamphetamine UR 10/10/2022 None Detected  NONE DETECTED (Cut Off Level 1000 ng/mL)  Final   POC Morphine 10/10/2022 None Detected  NONE DETECTED (Cut Off Level 300 ng/mL) Final   POC Methadone UR 10/10/2022 None Detected  NONE DETECTED (Cut Off Level 300 ng/mL) Final   POC Oxycodone UR 10/10/2022 None Detected  NONE DETECTED (Cut Off Level 100 ng/mL) Final   POC Marijuana UR 10/10/2022 Positive (A)  NONE DETECTED (Cut Off Level 50 ng/mL) Final   HIV Screen 4th Generation wRfx 10/09/2022 Non Reactive  Non Reactive Final   Performed at [facility], [address], [place], [place] [address]   Hepatitis B Surface Ag 10/09/2022 NON REACTIVE  NON REACTIVE Final   HCV Ab 10/09/2022 NON REACTIVE  NON REACTIVE Final   Comment: (NOTE) Nonreactive HCV antibody screen is consistent with no HCV infections,  unless recent infection is suspected or other evidence exists to indicate HCV infection.     Hep A IgM 10/09/2022 NON REACTIVE  NON REACTIVE Final   Hep B C IgM 10/09/2022 NON REACTIVE  NON REACTIVE Final   Performed at [facility], [address] [place], [place] [address]   RPR Ser Ql 10/09/2022 NON REACTIVE  NON REACTIVE Final   Performed at [facility], [address], [place], [place] [address]  Admission on 09/16/2022, Discharged on 09/17/2022  Component Date Value Ref Range Status   WBC 09/16/2022 4.8  4.0 - 10.5 K/uL Final   RBC 09/16/2022 4.76  3.87 - 5.11 MIL/uL Final   Hemoglobin 09/16/2022 12.0  12.0 - 15.0 g/dL Final   HCT 10/62/6948 39.3  36.0 - 46.0 % Final   MCV 09/16/2022 82.6  80.0 - 100.0 fL Final   MCH 09/16/2022 25.2 (L)  26.0 - 34.0 pg Final   MCHC 09/16/2022 30.5  30.0 - 36.0 g/dL Final   RDW 54/62/7035 14.3  11.5 - 15.5 % Final   Platelets 09/16/2022 222  150 - 400 K/uL Final   nRBC 09/16/2022 0.0  0.0 - 0.2 % Final   Neutrophils Relative % 09/16/2022 47  % Final   Neutro Abs 09/16/2022 2.2  1.7 - 7.7 K/uL Final   Lymphocytes Relative 09/16/2022 44  % Final   Lymphs Abs 09/16/2022 2.1  0.7 - 4.0 K/uL Final   Monocytes Relative  09/16/2022 6  % Final   Monocytes Absolute 09/16/2022 0.3  0.1 - 1.0 K/uL Final   Eosinophils Relative 09/16/2022 2  % Final   Eosinophils Absolute 09/16/2022 0.1  0.0 - 0.5 K/uL Final   Basophils Relative 09/16/2022 1  % Final   Basophils Absolute 09/16/2022 0.0  0.0 - 0.1 K/uL Final   Immature Granulocytes 09/16/2022 0  % Final   Abs Immature Granulocytes 09/16/2022 0.01  0.00 - 0.07 K/uL Final   Performed at [facility], [address], [place], [place] [address]   Sodium 09/16/2022 140  135 - 145 mmol/L Final   Potassium 09/16/2022 3.4 (L)  3.5 - 5.1 mmol/L Final   Chloride 09/16/2022 102  98 - 111 mmol/L Final   CO2 09/16/2022 29  22 - 32 mmol/L Final   Glucose, Bld 09/16/2022 115 (H)  70 - 99 mg/dL Final   Glucose reference range applies only to samples taken after fasting for at least 8 hours.   BUN 09/16/2022 25 (H)  6 - 20 mg/dL Final   Creatinine, Ser 09/16/2022 0.90  0.44 - 1.00 mg/dL Final   Calcium 18/29/9371 9.0  8.9 - 10.3 mg/dL Final   Total Protein 69/67/8938 6.4 (L)  6.5 - 8.1 g/dL Final   Albumin 11/12/5100 4.1  3.5 - 5.0 g/dL Final   AST 58/52/7782 15  15 - 41 U/L Final   ALT 09/16/2022 15  0 - 44 U/L Final   Alkaline Phosphatase 09/16/2022 43  38 - 126 U/L Final   Total Bilirubin 09/16/2022 0.4  0.3 - 1.2 mg/dL Final   GFR, Estimated 09/16/2022 >60  >60 mL/min Final   Comment: (NOTE) Calculated using the CKD-EPI Creatinine Equation (2021)  Anion gap 09/16/2022 9  5 - 15 Final   Performed at [facility], [address], [place], [place] [address]   Opiates 09/16/2022 POSITIVE (A)  NONE DETECTED Final   Cocaine 09/16/2022 POSITIVE (A)  NONE DETECTED Final   Benzodiazepines 09/16/2022 POSITIVE (A)  NONE DETECTED Final   Amphetamines 09/16/2022 NONE DETECTED  NONE DETECTED Final   Tetrahydrocannabinol 09/16/2022 NONE DETECTED  NONE DETECTED Final   Barbiturates 09/16/2022 NONE DETECTED  NONE DETECTED Final   Comment: (NOTE) DRUG SCREEN FOR MEDICAL  PURPOSES ONLY.  IF CONFIRMATION IS NEEDED FOR ANY PURPOSE, NOTIFY LAB WITHIN 5 DAYS.  LOWEST DETECTABLE LIMITS FOR URINE DRUG SCREEN Drug Class                     Cutoff (ng/mL) Amphetamine and metabolites    1000 Barbiturate and metabolites    200 Benzodiazepine                 200 Opiates and metabolites        300 Cocaine and metabolites        300 THC                            50 Performed at [facility], [address] [place], [place] [address]    Preg, Serum 09/16/2022 NEGATIVE  NEGATIVE Final   Comment:        THE SENSITIVITY OF THIS METHODOLOGY IS >10 mIU/mL. Performed at [facility], [address] [place], [place] [address]   Admission on 09/12/2022, Discharged on 09/13/2022  Component Date Value Ref Range Status   Sodium 09/12/2022 138  135 - 145 mmol/L Final   Potassium 09/12/2022 4.8  3.5 - 5.1 mmol/L Final   Chloride 09/12/2022 102  98 - 111 mmol/L Final   CO2 09/12/2022 27  22 - 32 mmol/L Final   Glucose, Bld 09/12/2022 93  70 - 99 mg/dL Final   Glucose reference range applies only to samples taken after fasting for at least 8 hours.   BUN 09/12/2022 13  6 - 20 mg/dL Final   Creatinine, Ser 09/12/2022 0.57  0.44 - 1.00 mg/dL Final   Calcium 53/66/4403 9.6  8.9 - 10.3 mg/dL Final   Total Protein 47/42/5956 7.6  6.5 - 8.1 g/dL Final   Albumin 38/75/6433 4.5  3.5 - 5.0 g/dL Final   AST 29/51/8841 24  15 - 41 U/L Final   ALT 09/12/2022 23  0 - 44 U/L Final   Alkaline Phosphatase 09/12/2022 58  38 - 126 U/L Final   Total Bilirubin 09/12/2022 0.5  0.3 - 1.2 mg/dL Final   GFR, Estimated 09/12/2022 >60  >60 mL/min Final   Comment: (NOTE) Calculated using the CKD-EPI Creatinine Equation (2021)    Anion gap 09/12/2022 9  5 - 15 Final   Performed at [facility], [address], [place], [place] [address]   Alcohol, Ethyl (B) 09/12/2022 <10  <10 mg/dL Final   Comment: (NOTE) Lowest detectable limit for serum alcohol is 10  mg/dL.  For medical purposes only. Performed at [facility], [address], [place], [place] [address]    WBC 09/12/2022 3.3 (L)  4.0 - 10.5 K/uL Final   RBC 09/12/2022 5.06  3.87 - 5.11 MIL/uL Final   Hemoglobin 09/12/2022 13.1  12.0 - 15.0 g/dL Final   HCT 09/32/3557 42.4  36.0 - 46.0 % Final   MCV 09/12/2022 83.8  80.0 - 100.0 fL Final   MCH 09/12/2022 25.9 (L)  26.0 - 34.0 pg Final   MCHC 09/12/2022 30.9  30.0 - 36.0 g/dL Final   RDW 16/10/9602 14.1  11.5 - 15.5 % Final   Platelets 09/12/2022 211  150 - 400 K/uL Final   nRBC 09/12/2022 0.0  0.0 - 0.2 % Final   Performed at [facility], [address], [place], [place] [address]   Opiates 09/12/2022 POSITIVE (A)  NONE DETECTED Final   Cocaine 09/12/2022 POSITIVE (A)  NONE DETECTED Final   Benzodiazepines 09/12/2022 POSITIVE (A)  NONE DETECTED Final   Amphetamines 09/12/2022 NONE DETECTED  NONE DETECTED Final   Tetrahydrocannabinol 09/12/2022 NONE DETECTED  NONE DETECTED Final   Barbiturates 09/12/2022 NONE DETECTED  NONE DETECTED Final   Comment: (NOTE) DRUG SCREEN FOR MEDICAL PURPOSES ONLY.  IF CONFIRMATION IS NEEDED FOR ANY PURPOSE, NOTIFY LAB WITHIN 5 DAYS.  LOWEST DETECTABLE LIMITS FOR URINE DRUG SCREEN Drug Class                     Cutoff (ng/mL) Amphetamine and metabolites    1000 Barbiturate and metabolites    200 Benzodiazepine                 200 Opiates and metabolites        300 Cocaine and metabolites        300 THC                            50 Performed at [facility], [address] [place], [place] [address]    Preg, Serum 09/12/2022 NEGATIVE  NEGATIVE Final   Comment:        THE SENSITIVITY OF THIS METHODOLOGY IS >10 mIU/mL. Performed at [facility], [address] [place], [place] [address]    Specimen Source 09/12/2022 URINE, CLEAN CATCH   Final   Color, Urine 09/12/2022 YELLOW  YELLOW Final   APPearance  09/12/2022 CLEAR  CLEAR Final   Specific Gravity, Urine 09/12/2022 1.023  1.005 - 1.030 Final   pH 09/12/2022 6.0  5.0 - 8.0 Final   Glucose, UA 09/12/2022 NEGATIVE  NEGATIVE mg/dL Final   Hgb urine dipstick 09/12/2022 NEGATIVE  NEGATIVE Final   Bilirubin Urine 09/12/2022 NEGATIVE  NEGATIVE Final   Ketones, ur 09/12/2022 NEGATIVE  NEGATIVE mg/dL Final   Protein, ur 62/13/0865 NEGATIVE  NEGATIVE mg/dL Final   Nitrite 78/46/9629 NEGATIVE  NEGATIVE Final   Leukocytes,Ua 09/12/2022 NEGATIVE  NEGATIVE Final   RBC / HPF 09/12/2022 0-5  0 - 5 RBC/hpf Final   WBC, UA 09/12/2022 0-5  0 - 5 WBC/hpf Final   Comment:        Reflex urine culture not performed if WBC <=10, OR if Squamous epithelial cells >5. If Squamous epithelial cells >5 suggest recollection.    Bacteria, UA 09/12/2022 NONE SEEN  NONE SEEN Final   Squamous Epithelial / HPF 09/12/2022 0-5  0 - 5 /HPF Final   Mucus 09/12/2022 PRESENT   Final   Performed at [facility], [address], [place], [place] [address]    Blood Alcohol level:  Lab Results  Component Value Date   [facility] <10 10/09/2022   ETH <10 09/12/2022    Metabolic Disorder Labs: Lab Results  Component Value Date   HGBA1C 5.1  08/26/2020   MPG 99.67 08/26/2020   MPG 108 04/20/2010   Lab Results  Component Value Date   PROLACTIN 9.4 03/16/2012   Lab Results  Component Value Date   CHOL 163 04/11/2017   TRIG 63 04/14/2022   HDL 71 04/11/2017   CHOLHDL 2.3 04/11/2017   VLDL 11 04/11/2017   LDLCALC 81 04/11/2017   LDLCALC 139 (H) 12/10/2012    Therapeutic Lab Levels: No results found for: "LITHIUM" Lab Results  Component Value Date   VALPROATE 22.2 (L) 03/19/2012   No results found for: "CBMZ"  Physical Findings   AIMS    Flowsheet Row Ad[facility] (Discharged[facility]  AIMS Total Score 0      AUDIT    Flowsheet Row Admission (Discharged) from OP Visit  [facility]  Alcohol Use Disorder Identification Test Final Score (AUDIT) 26      GAD-7    Flowsheet Row Routine [facility] Routine Prenatal from 07/11/2020 in [facility] Routine Prenatal from 06/05/2020 in [facility] Video Visit from 03/13/2020 in [facility] for Women Initial Prenatal from 02/14/2020 in [facility]  Total GAD-7 Score 11 7 14  0 2      PHQ2-9    Flowsheet Row ED from 11/19/2022 in [facility] ED from 10/08/2022 in [facility] Office Visit from 03/12/2021 in [facility] Office Visit from 02/12/2021 in [facility] Office Visit from 12/25/2020 in [facility]  PHQ-2 Total Score 1 6 3 1 5   PHQ-9 Total Score -- 15 7 6 14       Flowsheet Row ED from 11/19/2022 in [facility] ED from 11/18/2022 in [place] ED from 10/08/2022 in [facility]  C-SSRS RISK CATEGORY High Risk No Risk No Risk        Musculoskeletal  Strength & Muscle Tone: within normal limits Gait & Station: normal Patient leans: N/A  Psychiatric Specialty Exam  Presentation  General Appearance:  Disheveled  Eye Contact: Minimal  Speech: Clear and Coherent; Normal Rate  Speech Volume: Decreased  Handedness: -- (not assessed)   Mood and Affect  Mood: -- ("I don't know")  Affect: Tearful   Thought Process  Thought Processes: Linear  Descriptions of Associations:Intact  Orientation:-- (not assessed)  Thought Content:Logical  Diagnosis of  Schizophrenia or Schizoaffective disorder in past: No    Hallucinations:Hallucinations: None  Ideas of Reference:None  Suicidal Thoughts:Suicidal Thoughts: No  Homicidal Thoughts:Homicidal Thoughts: No   Sensorium  Memory: -- (not assessed)  Judgment: Fair  Insight: Fair   Art therapist  Concentration: Fair  Attention Span: Fair  Recall: Jennelle Human of Knowledge: Fair  Language: Fair   Psychomotor Activity  Psychomotor Activity: Psychomotor Activity: Normal   Assets  Assets: Desire for Improvement; Resilience; Communication Skills   Sleep  Sleep: Sleep: Good   Nutritional Assessment (For [facility]BS and [facility] admissions only) Has the patient had a weight loss or gain of 10 pounds or more in the last 3 months?: No Has the patient had a decrease in food intake/or  appetite?: No Does the patient have dental problems?: No Does the patient have eating habits or behaviors that may be indicators of an eating disorder including binging or inducing vomiting?: No Has the patient recently lost weight without trying?: 0 Has the patient been eating poorly because of a decreased appetite?: 0 Malnutrition Screening Tool Score: 0    Physical Exam  Physical Exam Vitals and nursing note reviewed.  HENT:     Head:     Comments: Poor dentition with gingival inflammation Pulmonary:     Effort: Pulmonary effort is normal. No respiratory distress.  Musculoskeletal:        General: Normal range of motion.     Cervical back: Neck supple. No tenderness.  Skin:    General: Skin is warm and dry.  Neurological:     General: No focal deficit present.     Mental Status: She is alert.    Review of Systems  Constitutional:  Positive for malaise/fatigue.  HENT:         Tooth pain, jaw pain  Respiratory: Negative.    Cardiovascular: Negative.    Blood pressure 100/61, pulse 82, temperature 98.3 F (36.8 C), temperature source Oral, resp. rate 18, SpO2 99%. There is  no height or weight on file to calculate BMI.  Treatment Plan Summary: Daily contact with patient to assess and evaluate symptoms and progress in treatment and Medication management  Psychiatric Diagnoses: Substance-induced mood disorder Opioid use disorder Benzodiazepine use disorder     Psychiatric Diagnoses and Treatment:  #Benzodiazepine use disorder -Ativan taper, started 10/23 -CIWA with Ativan as needed for CIWA greater than 10 PRNs -Thiamine 100 mg IM first day and PO after that -Multivitamin with minerals daily -Tylenol 650 mg every 6 hours as needed for pain -Zofran 4 mg every 6 hours as needed for nausea or vomiting -Imodium 2 to 4 mg as needed for diarrhea or loose stools  -Maalox/Mylanta 30 mL every 4 hours as needed for indigestion -Milk of Mag 30 mL as needed for constipation     #Opioid use disorder Opioid Use Disorder COWS Clonidine taper PRNs  -Tylenol 650 mg every 6 hours as needed for mild pain -Naproxen 500 mg BID as needed for pain -Bentyl 20 mg every 6 hours as needed for spasms/abdominal cramping -Robaxin 500 mg every 8 hours as needed for muscle spasms -Zofran 4 mg every 6 hours as needed for nausea or vomiting -Imodium 2 to 4 mg as needed for diarrhea or loose stools -Maalox/Mylanta 30 mL every 4 hours as needed for indigestion -Milk of Mag 30 mL as needed for constipation      Medical Issues Being Addressed:    Elevated LFTs Likely secondary to polysubstance use. Hepatic dosing of ativan taper.   Odontogenic Infection -Encouraged dental hygiene -Started 7 day course of augmentin (10/24-10/31) - Pt needs dental f/u for assessment of dental abscess. Will consider transfer to ED if she develops fever or pain worsens.   Other PRNs: Patient agitation protocol (Ativan p.o./IM, Zyprexa Zydis, Geodon) Atarax 25 mg 3 times daily as needed     EKG pending      Disposition: pending      Signed: Lorri Frederick, MD 11/20/2022  11:09 AM

## 2022-11-20 NOTE — Discharge Planning (Signed)
Treatment team discussed patient on today. Plan is to explore if patient is willing for team to follow up with her probation officer to inform of plan to seek treatment vs going to jail. MD will touch basis with patient later today. Updates will be provided as received.   Fernande Boyden, LCSW Clinical Social Worker [facility] Ph: [phone]

## 2022-11-20 NOTE — Progress Notes (Signed)
Pt is awake, alert and oriented X3. Pt complained of toothache. No signs of acute distress noted. Administered scheduled meds per order. Pt denies current SI/HI/AVH, plan or intent. Staff will monitor for pt's safety.

## 2022-11-20 NOTE — Progress Notes (Signed)
Pt is asleep. Respirations are even and unlabored. No signs of acute distress noted. Staff will monitor for pt's safety. 

## 2022-11-20 NOTE — ED Notes (Signed)
Pt is in the dayroom watching TV with peers.pt complained of toothache, which writer made her aware, she has medication for that so she can have it now.  Pt denies SI/HI/AVH. No acute distress noted. Will continue to monitor for safety for safety and provide support.

## 2022-11-20 NOTE — ED Notes (Signed)
Pt was provided breakfast.

## 2022-11-21 DIAGNOSIS — F1310 Sedative, hypnotic or anxiolytic abuse, uncomplicated: Secondary | ICD-10-CM | POA: Diagnosis not present

## 2022-11-21 DIAGNOSIS — B998 Other infectious disease: Secondary | ICD-10-CM | POA: Diagnosis not present

## 2022-11-21 DIAGNOSIS — F1120 Opioid dependence, uncomplicated: Secondary | ICD-10-CM | POA: Diagnosis not present

## 2022-11-21 DIAGNOSIS — F1994 Other psychoactive substance use, unspecified with psychoactive substance-induced mood disorder: Secondary | ICD-10-CM | POA: Diagnosis not present

## 2022-11-21 MED ORDER — OXCARBAZEPINE 150 MG PO TABS
150.0000 mg | ORAL_TABLET | Freq: Two times a day (BID) | ORAL | Status: DC
  Administered 2022-11-21 – 2022-11-22 (×4): 150 mg via ORAL
  Filled 2022-11-21 (×5): qty 1

## 2022-11-21 MED ORDER — BENZOCAINE 10 % MT GEL
1.0000 | Freq: Once | OROMUCOSAL | Status: AC
  Administered 2022-11-21: 1 via OROMUCOSAL
  Filled 2022-11-21: qty 9

## 2022-11-21 MED ORDER — BENZOCAINE 10 % MT GEL: 1.0000 | Freq: Four times a day (QID) | OROMUCOSAL | Status: DC | PRN

## 2022-11-21 MED ORDER — IBUPROFEN 600 MG PO TABS
600.0000 mg | ORAL_TABLET | Freq: Four times a day (QID) | ORAL | Status: DC | PRN
  Administered 2022-11-21 – 2022-11-24 (×7): 600 mg via ORAL
  Filled 2022-11-21 (×7): qty 1
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
## 2022-11-21 NOTE — ED Notes (Signed)
Patient is sleeping. Respirations equal and unlabored, skin warm and dry, NAD. No change in assessment or acuity. Routine safety checks conducted according to facility protocol. Will continue to monitor for safety.   
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
## 2022-11-21 NOTE — Discharge Planning (Signed)
LCSW received phone call back from the patient's mother regarding update with probation officer. Per Mother, probation officer reports that if the patient is not interested in treatment at this time, then the pending warrants for her arrest would be issued. Mother reports she would like for the patient to call her or she would need to be notified when the patient discharges. Mother was informed that team would not violate patient's request to not have her discharge date aware to anyone. Mother became forceful in conversation stating "my daughter really needs help and she needs to understand that it's this or jail". Mother aware that team has attempted to speak with patient regarding disposition plans, however patient is adamant about her decision to not seek further help at this time. Mother made aware that due to the patient being of age, if patient requests for information not to be shared then team will uphold that request. Mother expressed understanding and stated "Well I'll just be calling her probation officer when she gets out", and proceed to hang up the phone.   LCSW went and spoke with patient who reports "she does not want to talk about this right now, and stated she will not call her mother". LCSW expressed understanding. No other needs to report at this time.   Fernande Boyden, LCSW Clinical Social Worker [facility] Ph: [phone]

## 2022-11-21 NOTE — ED Notes (Signed)
Pt was provided breakfast.

## 2022-11-21 NOTE — ED Notes (Signed)
Patient observed/assessed at bedside lying in bed asleep. Patient alert and oriented to self and location. Affect is flat and patient seems agitated to be disturbed from sleeping. Patient denies pain and anxiety. He denies A/V/H. She denies having any thoughts/plan of self harm and harm towards others. Patient states that appetite has been good throughout the day. Verbalizes no further complaints at this time. Will continue to monitor for safety and provide support

## 2022-11-21 NOTE — ED Notes (Signed)
Pt requested for pain medication for tooth ache. Ibuprofen  and zilactin-B gel was administered to pt.
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
## 2022-11-21 NOTE — ED Notes (Signed)
Pt requesting to discharge. Writer informed pt that she was IVC and would have to speak with a provider. Pt states, "this place is a fucking joke. I don't understand why I can't just leave if I want to. I can't have Ativan or Clonidine when I want to. Y'all treat [place] like we are in jail". Writer attempted to explain parameters for both medications and Clonidine being held due to low BP readings but pt was not receptive to listening. Pt denies SI/HI/AVH. Denies withdrawal sx or cravings. Pt reports medication for toothache "helps a little". Pt returned to her room and slammed the door. MD made aware of pt behaviors. Will continue to monitor for safety.

## 2022-11-21 NOTE — ED Notes (Signed)
Pt sleeping in no acute distress. RR even and unlabored. Environment secured. Will continue to monitor for safety. 

## 2022-11-21 NOTE — ED Provider Notes (Signed)
Behavioral Health Progress Note  Date and Time: 11/21/2022 2:33 PM Name: [name] MRN:  [id number]  Subjective:   [name] is a 27 yo female with a PPHx is significant for Polysubstance use (opioid use disorder with previous ODs, Benzos, and Stimulants(cocaine, meth)), MDD, GAD, and ADHD, and 1 Suicide Attempt (age 60 OD), and 5 Psychiatric Hospitalizations (last [facility]- 03/2017).  She presented to [facility] on 11/19/2022 under IVC, petitioned by mother, for concerns of imminent danger to self in setting of impulsivity and increase drug use.  During the entire interview she was tearful and crying.  She reports that she wants to go home.  She reports that she has started the Trileptal but has not noticed any changes yet.  Discussed with her that it would take a few days before having an effect.  She reports she continues to have significant pain in her mouth.  She reports that she has not been eating much due to this but does report being able to drink fluids.  She reports no SI, HI, or AVH.  She reports no other concerns at present.  Diagnosis:  Final diagnoses:  Substance induced mood disorder (HCC)  Opioid type dependence, continuous (HCC)  Mild benzodiazepine use disorder (HCC)    Total Time spent with patient: 30 minutes  Past Psychiatric History: Polysubstance use (opioid use disorder with previous ODs, Benzos, and Stimulants(cocaine, meth)), MDD, GAD, and ADHD, and 1 Suicide Attempt (age 30 OD), and 5 Psychiatric Hospitalizations (last [facility]- 03/2017).  Past Medical History: Polysubstance use (opioid use disorder with previous ODs, Benzos, and Stimulants(cocaine, meth)) Family History: Unable to obtain from pt due to limited participation, see Hx tab.  Family Psychiatric  History: Unable to obtain from pt due to limited participation, see Hx tab.  Social History:  Lives with brother in [place]. Legal hx: Currently on parole. Has hx of prior incarcerations.   Additional Social History:                          Sleep: Fair  Appetite:  Fair  Current Medications:  Current Facility-Administered Medications  Medication Dose Route Frequency Provider Last Rate Last Admin   alum & mag hydroxide-simeth (MAALOX/MYLANTA) 200-200-20 MG/5ML suspension 30 mL  30 mL Oral Q4H PRN White, Patrice L, NP       amoxicillin-clavulanate (AUGMENTIN) 875-125 MG per tablet 1 tablet  1 tablet Oral Q12H Carrion-Carrero, Margely, MD   1 tablet at 11/21/22 0926   benzocaine (ORAJEL) 10 % mucosal gel 1 Application  1 Application Mouth/Throat QID PRN Nelly Rout, MD       dicyclomine (BENTYL) tablet 20 mg  20 mg Oral Q6H PRN White, Patrice L, NP       diphenhydrAMINE (BENADRYL) injection 50 mg  50 mg Intramuscular Q12H PRN White, Patrice L, NP       hydrOXYzine (ATARAX) tablet 25 mg  25 mg Oral TID PRN White, Patrice L, NP   25 mg at 11/21/22 1147   ibuprofen (ADVIL) tablet 600 mg  600 mg Oral Q6H PRN Nelly Rout, MD   600 mg at 11/21/22 1147   loperamide (IMODIUM) capsule 2-4 mg  2-4 mg Oral PRN White, Patrice L, NP       OLANZapine zydis (ZYPREXA) disintegrating tablet 10 mg  10 mg Oral Q8H PRN Carrion-Carrero, Margely, MD       And   LORazepam (ATIVAN) tablet 1 mg  1 mg Oral PRN Carrion-Carrero, Margely,  MD       And   ziprasidone (GEODON) injection 20 mg  20 mg Intramuscular PRN Carrion-Carrero, Margely, MD       LORazepam (ATIVAN) tablet 1 mg  1 mg Oral Q6H PRN Carrion-Carrero, Margely, MD   1 mg at 11/19/22 1105   LORazepam (ATIVAN) tablet 1 mg  1 mg Oral BID Carrion-Carrero, Margely, MD       Followed by   Melene Muller ON 11/23/2022] LORazepam (ATIVAN) tablet 1 mg  1 mg Oral Daily Carrion-Carrero, Margely, MD       magnesium hydroxide (MILK OF MAGNESIA) suspension 30 mL  30 mL Oral Daily PRN White, Patrice L, NP       methocarbamol (ROBAXIN) tablet 500 mg  500 mg Oral Q8H PRN White, Patrice L, NP   500 mg at 11/20/22 2119   multivitamin with minerals tablet 1 tablet  1 tablet Oral  Daily White, Patrice L, NP   1 tablet at 11/21/22 0926   ondansetron (ZOFRAN-ODT) disintegrating tablet 4 mg  4 mg Oral Q6H PRN White, Patrice L, NP       OXcarbazepine (TRILEPTAL) tablet 150 mg  150 mg Oral BID Nelly Rout, MD   150 mg at 11/21/22 1148   thiamine (VITAMIN B1) tablet 100 mg  100 mg Oral Daily White, Patrice L, NP   100 mg at 11/21/22 1660   traZODone (DESYREL) tablet 50 mg  50 mg Oral QHS PRN White, Patrice L, NP   50 mg at 11/20/22 2118   No current outpatient medications on file.    Labs  Lab Results:  Admission on 11/18/2022, Discharged on 11/19/2022  Component Date Value Ref Range Status   WBC 11/18/2022 3.9 (L)  4.0 - 10.5 K/uL Final   RBC 11/18/2022 4.58  3.87 - 5.11 MIL/uL Final   Hemoglobin 11/18/2022 11.8 (L)  12.0 - 15.0 g/dL Final   HCT 63/01/6008 37.7  36.0 - 46.0 % Final   MCV 11/18/2022 82.3  80.0 - 100.0 fL Final   MCH 11/18/2022 25.8 (L)  26.0 - 34.0 pg Final   MCHC 11/18/2022 31.3  30.0 - 36.0 g/dL Final   RDW 93/23/5573 14.0  11.5 - 15.5 % Final   Platelets 11/18/2022 173  150 - 400 K/uL Final   nRBC 11/18/2022 0.0  0.0 - 0.2 % Final   Neutrophils Relative % 11/18/2022 40  % Final   Neutro Abs 11/18/2022 1.5 (L)  1.7 - 7.7 K/uL Final   Lymphocytes Relative 11/18/2022 42  % Final   Lymphs Abs 11/18/2022 1.6  0.7 - 4.0 K/uL Final   Monocytes Relative 11/18/2022 7  % Final   Monocytes Absolute 11/18/2022 0.3  0.1 - 1.0 K/uL Final   Eosinophils Relative 11/18/2022 10  % Final   Eosinophils Absolute 11/18/2022 0.4  0.0 - 0.5 K/uL Final   Basophils Relative 11/18/2022 1  % Final   Basophils Absolute 11/18/2022 0.0  0.0 - 0.1 K/uL Final   Immature Granulocytes 11/18/2022 0  % Final   Abs Immature Granulocytes 11/18/2022 0.01  0.00 - 0.07 K/uL Final   Performed at [facility], [address] [place], [place] [address]   Sodium 11/18/2022 142  135 - 145 mmol/L Final   Potassium 11/18/2022 4.0  3.5 - 5.1 mmol/L Final   Chloride 11/18/2022 107   98 - 111 mmol/L Final   CO2 11/18/2022 26  22 - 32 mmol/L Final   Glucose, Bld 11/18/2022 74  70 -  99 mg/dL Final   Glucose reference range applies only to samples taken after fasting for at least 8 hours.   BUN 11/18/2022 10  6 - 20 mg/dL Final   Creatinine, Ser 11/18/2022 0.64  0.44 - 1.00 mg/dL Final   Calcium 40/34/7425 8.6 (L)  8.9 - 10.3 mg/dL Final   Total Protein 95/63/8756 5.7 (L)  6.5 - 8.1 g/dL Final   Albumin 43/32/9518 3.6  3.5 - 5.0 g/dL Final   AST 84/16/6063 82 (H)  15 - 41 U/L Final   ALT 11/18/2022 57 (H)  0 - 44 U/L Final   Alkaline Phosphatase 11/18/2022 69  38 - 126 U/L Final   Total Bilirubin 11/18/2022 0.4  0.3 - 1.2 mg/dL Final   GFR, Estimated 11/18/2022 >60  >60 mL/min Final   Comment: (NOTE) Calculated using the CKD-EPI Creatinine Equation (2021)    Anion gap 11/18/2022 9  5 - 15 Final   Performed at [facility], [address], [place], [place] [address]   Preg Test, Ur 11/18/2022 Negative  Negative Final   POC Amphetamine UR 11/18/2022 None Detected  NONE DETECTED (Cut Off Level 1000 ng/mL) Final   POC Secobarbital (BAR) 11/18/2022 None Detected  NONE DETECTED (Cut Off Level 300 ng/mL) Final   POC Buprenorphine (BUP) 11/18/2022 None Detected  NONE DETECTED (Cut Off Level 10 ng/mL) Final   POC Oxazepam (BZO) 11/18/2022 Positive (A)  NONE DETECTED (Cut Off Level 300 ng/mL) Final   POC Cocaine UR 11/18/2022 None Detected  NONE DETECTED (Cut Off Level 300 ng/mL) Final   POC Methamphetamine UR 11/18/2022 None Detected  NONE DETECTED (Cut Off Level 1000 ng/mL) Final   POC Morphine 11/18/2022 None Detected  NONE DETECTED (Cut Off Level 300 ng/mL) Final   POC Methadone UR 11/18/2022 None Detected  NONE DETECTED (Cut Off Level 300 ng/mL) Final   POC Oxycodone UR 11/18/2022 None Detected  NONE DETECTED (Cut Off Level 100 ng/mL) Final   POC Marijuana UR 11/18/2022 None Detected  NONE DETECTED (Cut Off Level 50 ng/mL) Final  Admission on 10/08/2022, Discharged on  10/10/2022  Component Date Value Ref Range Status   WBC 10/09/2022 3.6 (L)  4.0 - 10.5 K/uL Final   RBC 10/09/2022 4.83  3.87 - 5.11 MIL/uL Final   Hemoglobin 10/09/2022 12.4  12.0 - 15.0 g/dL Final   HCT 09/32/3557 39.2  36.0 - 46.0 % Final   MCV 10/09/2022 81.2  80.0 - 100.0 fL Final   MCH 10/09/2022 25.7 (L)  26.0 - 34.0 pg Final   MCHC 10/09/2022 31.6  30.0 - 36.0 g/dL Final   RDW 32/20/2542 14.3  11.5 - 15.5 % Final   Platelets 10/09/2022 190  150 - 400 K/uL Final   nRBC 10/09/2022 0.0  0.0 - 0.2 % Final   Neutrophils Relative % 10/09/2022 43  % Final   Neutro Abs 10/09/2022 1.5 (L)  1.7 - 7.7 K/uL Final   Lymphocytes Relative 10/09/2022 45  % Final   Lymphs Abs 10/09/2022 1.6  0.7 - 4.0 K/uL Final   Monocytes Relative 10/09/2022 7  % Final   Monocytes Absolute 10/09/2022 0.2  0.1 - 1.0 K/uL Final   Eosinophils Relative 10/09/2022 4  % Final   Eosinophils Absolute 10/09/2022 0.1  0.0 - 0.5 K/uL Final   Basophils Relative 10/09/2022 1  % Final   Basophils Absolute 10/09/2022 0.0  0.0 - 0.1 K/uL Final   Immature Granulocytes 10/09/2022 0  % Final   Abs  Immature Granulocytes 10/09/2022 0.00  0.00 - 0.07 K/uL Final   Performed at [facility], [address] [place], [place] [address]   Sodium 10/09/2022 141  135 - 145 mmol/L Final   Potassium 10/09/2022 4.6  3.5 - 5.1 mmol/L Final   Chloride 10/09/2022 101  98 - 111 mmol/L Final   CO2 10/09/2022 27  22 - 32 mmol/L Final   Glucose, Bld 10/09/2022 80  70 - 99 mg/dL Final   Glucose reference range applies only to samples taken after fasting for at least 8 hours.   BUN 10/09/2022 8  6 - 20 mg/dL Final   Creatinine, Ser 10/09/2022 0.70  0.44 - 1.00 mg/dL Final   Calcium 32/95/1884 9.3  8.9 - 10.3 mg/dL Final   Total Protein 16/60/6301 6.0 (L)  6.5 - 8.1 g/dL Final   Albumin 60/10/9321 4.0  3.5 - 5.0 g/dL Final   AST 55/73/2202 16  15 - 41 U/L Final   ALT 10/09/2022 14  0 - 44 U/L Final   Alkaline Phosphatase 10/09/2022 48   38 - 126 U/L Final   Total Bilirubin 10/09/2022 0.8  0.3 - 1.2 mg/dL Final   GFR, Estimated 10/09/2022 >60  >60 mL/min Final   Comment: (NOTE) Calculated using the CKD-EPI Creatinine Equation (2021)    Anion gap 10/09/2022 13  5 - 15 Final   Performed at [facility], [address] [place], [place] [address]   Alcohol, Ethyl (B) 10/09/2022 <10  <10 mg/dL Final   Comment: (NOTE) Lowest detectable limit for serum alcohol is 10 mg/dL.  For medical purposes only. Performed at [facility], [address], [place], [place] [address]    TSH 10/09/2022 0.553  0.350 - 4.500 uIU/mL Final   Comment: Performed by a 3rd Generation assay with a functional sensitivity of <=0.01 uIU/mL. Performed at [facility], [address] [place], [place] [address]    Preg Test, Ur 10/10/2022 Negative  Negative Final   POC Amphetamine UR 10/10/2022 None Detected  NONE DETECTED (Cut Off Level 1000 ng/mL) Final   POC Secobarbital (BAR) 10/10/2022 None Detected  NONE DETECTED (Cut Off Level 300 ng/mL) Final   POC Buprenorphine (BUP) 10/10/2022 None Detected  NONE DETECTED (Cut Off Level 10 ng/mL) Final   POC Oxazepam (BZO) 10/10/2022 None Detected  NONE DETECTED (Cut Off Level 300 ng/mL) Final   POC Cocaine UR 10/10/2022 Positive (A)  NONE DETECTED (Cut Off Level 300 ng/mL) Final   POC Methamphetamine UR 10/10/2022 None Detected  NONE DETECTED (Cut Off Level 1000 ng/mL) Final   POC Morphine 10/10/2022 None Detected  NONE DETECTED (Cut Off Level 300 ng/mL) Final   POC Methadone UR 10/10/2022 None Detected  NONE DETECTED (Cut Off Level 300 ng/mL) Final   POC Oxycodone UR 10/10/2022 None Detected  NONE DETECTED (Cut Off Level 100 ng/mL) Final   POC Marijuana UR 10/10/2022 Positive (A)  NONE DETECTED (Cut Off Level 50 ng/mL) Final   HIV Screen 4th Generation wRfx 10/09/2022 Non Reactive  Non Reactive Final   Performed at [facility], [address] [place], [place] [address]   Hepatitis  B Surface Ag 10/09/2022 NON REACTIVE  NON REACTIVE Final   HCV Ab 10/09/2022 NON REACTIVE  NON REACTIVE Final   Comment: (NOTE) Nonreactive HCV antibody screen is consistent with no HCV infections,  unless recent infection is suspected or other evidence exists to indicate HCV infection.  Hep A IgM 10/09/2022 NON REACTIVE  NON REACTIVE Final   Hep B C IgM 10/09/2022 NON REACTIVE  NON REACTIVE Final   Performed at [facility], [address], [place], [place] [address]   RPR Ser Ql 10/09/2022 NON REACTIVE  NON REACTIVE Final   Performed at [facility], [address] [place], [place] [address]  Admission on 09/16/2022, Discharged on 09/17/2022  Component Date Value Ref Range Status   WBC 09/16/2022 4.8  4.0 - 10.5 K/uL Final   RBC 09/16/2022 4.76  3.87 - 5.11 MIL/uL Final   Hemoglobin 09/16/2022 12.0  12.0 - 15.0 g/dL Final   HCT 13/08/6576 39.3  36.0 - 46.0 % Final   MCV 09/16/2022 82.6  80.0 - 100.0 fL Final   MCH 09/16/2022 25.2 (L)  26.0 - 34.0 pg Final   MCHC 09/16/2022 30.5  30.0 - 36.0 g/dL Final   RDW 46/96/2952 14.3  11.5 - 15.5 % Final   Platelets 09/16/2022 222  150 - 400 K/uL Final   nRBC 09/16/2022 0.0  0.0 - 0.2 % Final   Neutrophils Relative % 09/16/2022 47  % Final   Neutro Abs 09/16/2022 2.2  1.7 - 7.7 K/uL Final   Lymphocytes Relative 09/16/2022 44  % Final   Lymphs Abs 09/16/2022 2.1  0.7 - 4.0 K/uL Final   Monocytes Relative 09/16/2022 6  % Final   Monocytes Absolute 09/16/2022 0.3  0.1 - 1.0 K/uL Final   Eosinophils Relative 09/16/2022 2  % Final   Eosinophils Absolute 09/16/2022 0.1  0.0 - 0.5 K/uL Final   Basophils Relative 09/16/2022 1  % Final   Basophils Absolute 09/16/2022 0.0  0.0 - 0.1 K/uL Final   Immature Granulocytes 09/16/2022 0  % Final   Abs Immature Granulocytes 09/16/2022 0.01  0.00 - 0.07 K/uL Final   Performed at [facility], [address], [place], [place] [address]   Sodium 09/16/2022 140  135 - 145 mmol/L Final    Potassium 09/16/2022 3.4 (L)  3.5 - 5.1 mmol/L Final   Chloride 09/16/2022 102  98 - 111 mmol/L Final   CO2 09/16/2022 29  22 - 32 mmol/L Final   Glucose, Bld 09/16/2022 115 (H)  70 - 99 mg/dL Final   Glucose reference range applies only to samples taken after fasting for at least 8 hours.   BUN 09/16/2022 25 (H)  6 - 20 mg/dL Final   Creatinine, Ser 09/16/2022 0.90  0.44 - 1.00 mg/dL Final   Calcium 44/01/270 9.0  8.9 - 10.3 mg/dL Final   Total Protein 53/66/4403 6.4 (L)  6.5 - 8.1 g/dL Final   Albumin 47/42/5956 4.1  3.5 - 5.0 g/dL Final   AST 38/75/6433 15  15 - 41 U/L Final   ALT 09/16/2022 15  0 - 44 U/L Final   Alkaline Phosphatase 09/16/2022 43  38 - 126 U/L Final   Total Bilirubin 09/16/2022 0.4  0.3 - 1.2 mg/dL Final   GFR, Estimated 09/16/2022 >60  >60 mL/min Final   Comment: (NOTE) Calculated using the CKD-EPI Creatinine Equation (2021)    Anion gap 09/16/2022 9  5 - 15 Final   Performed at [facility], [address], [place], [place] [address]   Opiates 09/16/2022 POSITIVE (A)  NONE DETECTED Final   Cocaine 09/16/2022 POSITIVE (A)  NONE DETECTED Final   Benzodiazepines 09/16/2022 POSITIVE (A)  NONE DETECTED Final   Amphetamines 09/16/2022 NONE DETECTED  NONE  DETECTED Final   Tetrahydrocannabinol 09/16/2022 NONE DETECTED  NONE DETECTED Final   Barbiturates 09/16/2022 NONE DETECTED  NONE DETECTED Final   Comment: (NOTE) DRUG SCREEN FOR MEDICAL PURPOSES ONLY.  IF CONFIRMATION IS NEEDED FOR ANY PURPOSE, NOTIFY LAB WITHIN 5 DAYS.  LOWEST DETECTABLE LIMITS FOR URINE DRUG SCREEN Drug Class                     Cutoff (ng/mL) Amphetamine and metabolites    1000 Barbiturate and metabolites    200 Benzodiazepine                 200 Opiates and metabolites        300 Cocaine and metabolites        300 THC                            50 Performed at [facility], [address], [place], [place] [address]    Preg, Serum 09/16/2022 NEGATIVE  NEGATIVE Final    Comment:        THE SENSITIVITY OF THIS METHODOLOGY IS >10 mIU/mL. Performed at [facility], [address], [place], [place] [address]   Admission on 09/12/2022, Discharged on 09/13/2022  Component Date Value Ref Range Status   Sodium 09/12/2022 138  135 - 145 mmol/L Final   Potassium 09/12/2022 4.8  3.5 - 5.1 mmol/L Final   Chloride 09/12/2022 102  98 - 111 mmol/L Final   CO2 09/12/2022 27  22 - 32 mmol/L Final   Glucose, Bld 09/12/2022 93  70 - 99 mg/dL Final   Glucose reference range applies only to samples taken after fasting for at least 8 hours.   BUN 09/12/2022 13  6 - 20 mg/dL Final   Creatinine, Ser 09/12/2022 0.57  0.44 - 1.00 mg/dL Final   Calcium 09/81/1914 9.6  8.9 - 10.3 mg/dL Final   Total Protein 78/29/5621 7.6  6.5 - 8.1 g/dL Final   Albumin 30/86/5784 4.5  3.5 - 5.0 g/dL Final   AST 69/62/9528 24  15 - 41 U/L Final   ALT 09/12/2022 23  0 - 44 U/L Final   Alkaline Phosphatase 09/12/2022 58  38 - 126 U/L Final   Total Bilirubin 09/12/2022 0.5  0.3 - 1.2 mg/dL Final   GFR, Estimated 09/12/2022 >60  >60 mL/min Final   Comment: (NOTE) Calculated using the CKD-EPI Creatinine Equation (2021)    Anion gap 09/12/2022 9  5 - 15 Final   Performed at [facility], [address], [place], [place] [address]   Alcohol, Ethyl (B) 09/12/2022 <10  <10 mg/dL Final   Comment: (NOTE) Lowest detectable limit for serum alcohol is 10 mg/dL.  For medical purposes only. Performed at [facility], [address], [place], [place] [address]    WBC 09/12/2022 3.3 (L)  4.0 - 10.5 K/uL Final   RBC 09/12/2022 5.06  3.87 - 5.11 MIL/uL Final   Hemoglobin 09/12/2022 13.1  12.0 - 15.0 g/dL Final   HCT 72/53/6644 42.4  36.0 - 46.0 % Final   MCV 09/12/2022 83.8  80.0 - 100.0 fL Final   MCH 09/12/2022 25.9 (L)  26.0 - 34.0 pg Final   MCHC 09/12/2022 30.9  30.0 - 36.0 g/dL Final   RDW 03/47/4259 14.1  11.5 - 15.5 % Final   Platelets 09/12/2022 211   150 - 400 K/uL Final  nRBC 09/12/2022 0.0  0.0 - 0.2 % Final   Performed at [facility], [address], [place], [place] [address]   Opiates 09/12/2022 POSITIVE (A)  NONE DETECTED Final   Cocaine 09/12/2022 POSITIVE (A)  NONE DETECTED Final   Benzodiazepines 09/12/2022 POSITIVE (A)  NONE DETECTED Final   Amphetamines 09/12/2022 NONE DETECTED  NONE DETECTED Final   Tetrahydrocannabinol 09/12/2022 NONE DETECTED  NONE DETECTED Final   Barbiturates 09/12/2022 NONE DETECTED  NONE DETECTED Final   Comment: (NOTE) DRUG SCREEN FOR MEDICAL PURPOSES ONLY.  IF CONFIRMATION IS NEEDED FOR ANY PURPOSE, NOTIFY LAB WITHIN 5 DAYS.  LOWEST DETECTABLE LIMITS FOR URINE DRUG SCREEN Drug Class                     Cutoff (ng/mL) Amphetamine and metabolites    1000 Barbiturate and metabolites    200 Benzodiazepine                 200 Opiates and metabolites        300 Cocaine and metabolites        300 THC                            50 Performed at [facility], [address] [place], [place] [address]    Preg, Serum 09/12/2022 NEGATIVE  NEGATIVE Final   Comment:        THE SENSITIVITY OF THIS METHODOLOGY IS >10 mIU/mL. Performed at [facility], [address] [place], [place] [address]    Specimen Source 09/12/2022 URINE, CLEAN CATCH   Final   Color, Urine 09/12/2022 YELLOW  YELLOW Final   APPearance 09/12/2022 CLEAR  CLEAR Final   Specific Gravity, Urine 09/12/2022 1.023  1.005 - 1.030 Final   pH 09/12/2022 6.0  5.0 - 8.0 Final   Glucose, UA 09/12/2022 NEGATIVE  NEGATIVE mg/dL Final   Hgb urine dipstick 09/12/2022 NEGATIVE  NEGATIVE Final   Bilirubin Urine 09/12/2022 NEGATIVE  NEGATIVE Final   Ketones, ur 09/12/2022 NEGATIVE  NEGATIVE mg/dL Final   Protein, ur 10/93/2355 NEGATIVE  NEGATIVE mg/dL Final   Nitrite 73/22/0254 NEGATIVE  NEGATIVE Final   Leukocytes,Ua 09/12/2022 NEGATIVE  NEGATIVE Final   RBC / HPF 09/12/2022 0-5  0 - 5  RBC/hpf Final   WBC, UA 09/12/2022 0-5  0 - 5 WBC/hpf Final   Comment:        Reflex urine culture not performed if WBC <=10, OR if Squamous epithelial cells >5. If Squamous epithelial cells >5 suggest recollection.    Bacteria, UA 09/12/2022 NONE SEEN  NONE SEEN Final   Squamous Epithelial / HPF 09/12/2022 0-5  0 - 5 /HPF Final   Mucus 09/12/2022 PRESENT   Final   Performed at [facility], [address], [place], [place] [address]    Blood Alcohol level:  Lab Results  Component Value Date   [facility] <10 10/09/2022   ETH <10 09/12/2022    Metabolic Disorder Labs: Lab Results  Component Value Date   HGBA1C 5.1 08/26/2020   MPG 99.67 08/26/2020   MPG 108 04/20/2010   Lab Results  Component Value Date   PROLACTIN 9.4 03/16/2012   Lab Results  Component Value Date   CHOL 163 04/11/2017   TRIG 63 04/14/2022   HDL 71 04/11/2017   CHOLHDL 2.3 04/11/2017   VLDL 11 04/11/2017   LDLCALC 81 04/11/2017  LDLCALC 139 (H) 12/10/2012    Therapeutic Lab Levels: No results found for: "LITHIUM" Lab Results  Component Value Date   VALPROATE 22.2 (L) 03/19/2012   No results found for: "CBMZ"  Physical Findings   AIMS    Flowsheet Row Admission (Discharged) [facility]  AIMS Total Score 0      AUDIT    Flowsheet Row Admission (Discharged) from OP [facility]  Alcohol Use Disorder Identification Test Final Score (AUDIT) 26      GAD-7    Flowsheet Row Routine [facility] Routine Prenatal from 07/11/2020 [facility] Routine Prenatal from 06/05/2020 in [facility] Video Visit from 03/13/2020 in [facility] Initial Prenatal from 02/14/2020 in [facility]  Total GAD-7 Score 11 7 14  0 2      PHQ2-9    Flowsheet Row ED from 11/19/2022 in [facility] ED from 10/08/2022 in [facility] Office Visit from 03/12/2021 in [facility] Office Visit from 02/12/2021 in [facility] Office Visit from 12/25/2020 in [facility]  PHQ-2 Total Score 1 6 3 1 5   PHQ-9 Total Score -- 15 7 6 14       Flowsheet Row ED from 11/19/2022 in [facility] ED from 11/18/2022 in [facility] ED from 10/08/2022 in [facility]  C-SSRS RISK CATEGORY High Risk No Risk No Risk        Musculoskeletal  Strength & Muscle Tone: within normal limits Gait & Station: normal Patient leans: N/A  Psychiatric Specialty Exam  Presentation  General Appearance:  Casual  Eye Contact: Minimal  Speech: -- (mostly clear and coherent but occasionally garbled due to crying)  Speech Volume: Decreased  Handedness: -- (not assessed)   Mood and Affect  Mood: Depressed  Affect: Tearful; Depressed   Thought Process  Thought Processes: Irrevelant  Descriptions of Associations:Intact  Orientation:Full (Time, Place and Person)  Thought Content:Logical; WDL  Diagnosis of Schizophrenia or Schizoaffective disorder in past: No    Hallucinations:Hallucinations: None  Ideas of Reference:None  Suicidal Thoughts:Suicidal Thoughts: No  Homicidal Thoughts:Homicidal Thoughts: No   Sensorium  Memory: Immediate Fair  Judgment: Fair  Insight: Fair   Art therapist  Concentration: Fair  Attention Span: Fair  Recall: [employer] of Knowledge: Fair  Language: Fair   Psychomotor Activity  Psychomotor Activity: Psychomotor Activity:  Normal   Assets  Assets: Desire for Improvement; Communication Skills; Resilience   Sleep  Sleep: Sleep: Fair   Nutritional Assessment (For [facility] and [facility] admissions only) Has the patient had a weight loss or gain of 10 pounds or more in the last 3 months?: No Has the patient had a decrease in food intake/or appetite?: No Does the patient have dental problems?: No Does the patient have eating habits or behaviors that may be indicators of an eating disorder including binging or inducing vomiting?: No Has the patient recently lost weight without trying?: 0 Has the patient been eating poorly because of a decreased appetite?: 0 Malnutrition Screening Tool Score: 0  Physical Exam  Physical Exam Vitals and nursing note reviewed.  Constitutional:      General: She is not in acute distress.    Appearance: Normal appearance. She is normal weight. She is not ill-appearing or toxic-appearing.  HENT:     Head: Normocephalic and atraumatic.  Pulmonary:     Effort: Pulmonary effort is normal.  Neurological:     General: No focal deficit present.     Mental Status: She is alert.    Review of Systems  Respiratory:  Negative for cough and shortness of breath.   Cardiovascular:  Negative for chest pain.  Gastrointestinal:  Negative for abdominal pain, constipation, diarrhea, nausea and vomiting.  Neurological:  Negative for dizziness, weakness and headaches.  Psychiatric/Behavioral:  Positive for depression. Negative for hallucinations and suicidal ideas. The patient is nervous/anxious.    Blood pressure (!) 97/58, pulse 87, temperature 98.7 F (37.1 C), temperature source Oral, resp. rate 18, SpO2 100%. There is no height or weight on file to calculate BMI.  Treatment Plan Summary: Daily contact with patient to assess and evaluate symptoms and progress in treatment and Medication management   [name] is a 27 yo female with a PPHx is significant for Polysubstance use (opioid  use disorder with previous ODs, Benzos, and Stimulants(cocaine, meth)), MDD, GAD, and ADHD, and 1 Suicide Attempt (age 22 OD), and 5 Psychiatric Hospitalizations (last [facility]- 03/2017).  She presented to [facility] on 11/19/2022 under IVC, petitioned by mother, for concerns of imminent danger to self in setting of impulsivity and increase drug use.   Ailyn continues to be emotionally labile but is not as angry today as she cried during the interview.  WE have started Trileptal to address her mood instability.  She continues to have significant pain with her oral infection which has interfered with her ability to eat but she is still able to drink fluids.  We will not make any other changes to her medications at this time.  We will continue to monitor.   Substance Induced Mood Disorder: -Start Trileptal 150 mg BID for  -Continue Agitation Protocol: Zyprexa/Ativan/Geodon   Withdrawal: -Continue CIWA, last score= 1  @ 1215 10/25 -Continue COWS, last score= 4  @ 9147  10/25 -Continue Ativan 1 mg q6 PRN CIWA>10 -Continue Ativan taper to end on 10/27 -Continue Imodium 2-4 mg PRN diarrhea -Continue Robaxin 500 mg q8 PRN muscle spasms -Continue Naproxen 500 mg BID PRN pain -Continue Zofran-ODT 4 mg q6 PRN nausea -Continue Bentyl 20 mg q6 PRN spasms -Continue Thiamine 100 mg daily for nutritional supplementation -Continue Multivitamin daily for nutritional supplementation   Oral Infection: -Continue Augmentin 875-125 mg q12 for 7 days.  Day (2 of 7) -Continue Orajel 10% QID PRN   -Continue PRN's: Advil, Maalox, Atarax, Milk of Magnesia, Trazodone   Dispo: Unclear   Lauro Franklin, MD 11/21/2022 2:33 PM
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
## 2022-11-21 NOTE — Discharge Instructions (Signed)
[facility] [address] [place], [place], [address] [phone] phone  New Patient Assessment/Therapy Walk-Ins:  Monday and Wednesday: 8 am until slots are full. Every 1st and 2nd Fridays of the month: 1 pm - 5 pm.  NO ASSESSMENT/THERAPY WALK-INS ON TUESDAYS OR THURSDAYS  New Patient Assessment/Medication Management Walk-Ins:  Monday - Friday:  8 am - 11 am.  For all walk-ins, we ask that you arrive by 7:30 am because patients will be seen in the order of arrival.  Availability is limited; therefore, you may not be seen on the same day that you walk-in.  Our goal is to serve and meet the needs of our community to the best of our ability.  SUBSTANCE USE TREATMENT for Medicaid and State Funded/IPRS  [facility] [address], [place], [address] [phone] phone NOTE: [facility] is no longer offering IOP services.  Serves those who are low-income or have no insurance.  [facility] [address], [place], [place], [address] [phone] phone [phone] fax NOTE: Does have Substance Abuse-Intensive Outpatient Program (SAIOP) as well as transitional housing if eligible.  [facility] [address] [place], [place], [address] [phone] phone [phone] fax  [facility] [address] [place], [place], [address] [phone] phone [phone] fax  HALFWAY HOUSES:  [facility] [phone]  [facility] www.oxfordvacancies.com  12 STEP PROGRAMS:  Alcoholics Anonymous of [place] https://aagreensboronc.com/meeting  Narcotics Anonymous of [place] https://greensborona.org/meetings/  Al-Anon of [place], [place] www.greens[place]alanon.org/find-meetings.html  Nar-Anon https://nar-anon.org/find-a-meetin  List of Residential placements:   [facility] in [place]: [phone]  [facility]: [phone]  [facility]: [place], [place]  [phone]: Female and female facility; 30-day program: (uninsured and Medicaid such as [facility], [facility], [facility], partners)  [facility]: [phone]; men and women's facility; 28 days; Can have Medicaid tailored plan ([facility] or [facility])  [facility]: [phone] [place] or [place]; 28 day program; must be fully detox; tailored Medicaid or no insurance  [facility] in [place], [place]; [phone]; 28 day all males program; no insurance accepted  [facility] in [place]: Joe [phone] (no insurance or Medicaid only); 90 days; outpatient services but provide housing in apartments [place]  [facility] Admission: [phone]: Patient must complete phone screening for placement: [place], [place]; 6 month program; uninsured, Medicaid, and [facility] insurance.   [facility]: no insurance required; [phone]  [place] [facility]: [phone]; Intake: Robert; Must fill out application online; Victor Delay [phone] x [phone]  [facility] in [place], [place]: [phone]; Admissions Coordinators Mr. Dennis or David Gibson; 90 day program.  [facility]: [place], [place] [phone]; Co-Ed 9 month to a year program; Online application; Men entry fee is $500 (6-12months);  [facility]: [address] [place], [place] [address]; no fee or insurance required; minimum of 2 years; Highly structured; work based; Intake Coordinator is Chris [phone]  [facility] in [place], [place]: [phone]; Fax number is [phone]; website: [facility].org; Requires 3-6 page autobiography; 2 year program (18 months and then 6month transitional housing); Admission fee is $300; no insurance needed; work program  [facility] in [place], [place]: Front Desk Staff: Reeci [phone]: They have a Men's Regenerations Program 6-9months. Free program; There is an initial $300 fee however, they are willing to work  with patients regarding that. Application is online.  [facility]: Admissions [phone] Benjamin Cox ext [phone]; Any 7-90 day program is out of pocket; 12   month program is free of charge; there is a $275 entry fee; Patient is responsible for own transportation 

## 2022-11-21 NOTE — ED Notes (Signed)
Pt was provided dinner.

## 2022-11-22 DIAGNOSIS — F1994 Other psychoactive substance use, unspecified with psychoactive substance-induced mood disorder: Secondary | ICD-10-CM | POA: Diagnosis not present

## 2022-11-22 DIAGNOSIS — F1310 Sedative, hypnotic or anxiolytic abuse, uncomplicated: Secondary | ICD-10-CM | POA: Diagnosis not present

## 2022-11-22 DIAGNOSIS — F1120 Opioid dependence, uncomplicated: Secondary | ICD-10-CM | POA: Diagnosis not present

## 2022-11-22 DIAGNOSIS — B998 Other infectious disease: Secondary | ICD-10-CM | POA: Diagnosis not present

## 2022-11-22 NOTE — ED Notes (Signed)
Pt became upset after speaking with provider. Pt slammed door closed after numerous redirects from staff over rules and reasons why doors have to remain cracked open. Pt states, "Bruh, I don't care. Get the fuck out of my room. Pt crying, not redirectable at present. Security called to unit x2 due to behaviors. Pt threw hygiene products and linen all over floor of room demanding staff to get out of room. Staff attempted deep breathing instructions with pt to calm pt down but pt would not listen. Pt offered Zyprexa 10mg  SL. Pt took medication willingly and proceeded to ask staff for towel and wash cloth to take shower. Pt given items after de-escalation techniques worked and pt calmed down. Pt thanked staff. Pt continue with med seeking tendencies to get Ativan. Pt requesting another provider stating, "none of these doctors know what they are doing. I want to get the F*@#K out of here", and went into room and sat on bed quietly. At current, pt is in shower. Staff continue to monitor closely for safety.

## 2022-11-22 NOTE — ED Notes (Signed)
Patient is sleeping. Respirations equal and unlabored, skin warm and dry. No change in assessment or acuity. Routine safety checks conducted according to facility protocol. Will continue to monitor for safety.   

## 2022-11-22 NOTE — ED Notes (Signed)
Pt approached nursing station requesting Vistaril for anxiety. Medication given. Pt tolerated well. Denies any other needs at present. Will continue to monitor for safety.
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
## 2022-11-22 NOTE — ED Notes (Signed)
Pt asked to see if she can get her meds an hour earlier.   Pt just joined group will let the RN know.

## 2022-11-22 NOTE — ED Provider Notes (Signed)
Behavioral Health Progress Note  Date and Time: 11/22/2022 3:52 PM Name: [name] MRN:  [id number]  Subjective:   The patient is a 27 year old female with a past psychiatric history of severe opioid use disorder, previous history of overdose in March 2024 requiring intubation and subsequent psychiatric hospitalization.  The patient presented to the [facility] behavioral urgent care under IVC by her mother.  It appears that her mother was worried about out-of-control substance use problems.  The involuntary commitment was upheld on admission to the facility based crisis.  On interview today, the patient is persistently tearful.  She reports experiencing overwhelming anxiety.  She feels that her anxiety has caused her to abuse substances.  She states that she lives alone and a trailer that her grandmother gave to her.  She reports primarily using heroin over the past several months.  She states that she has been addicted to the substance since she was 27 years old.  The patient denies experiencing any withdrawal symptoms.  She states that she is eating well.  She actually denies experiencing any depression and says that anxiety is her predominant symptom.  She denies experiencing any suicidal thoughts.  She has denied suicidal thoughts since admission, it appears.  Diagnosis:  Final diagnoses:  Substance induced mood disorder (HCC)  Opioid type dependence, continuous (HCC)  Mild benzodiazepine use disorder (HCC)    Total Time spent with patient: 20 minutes  Past Psychiatric History: as above Past Medical History: as above Family History: none Family Psychiatric  History: none Social History: as above and per H and P  Additional Social History:  See H and P                  Sleep: Fair  Appetite:  Fair   Current Medications:  Current Facility-Administered Medications  Medication Dose Route Frequency Provider Last Rate Last Admin   alum & mag hydroxide-simeth  (MAALOX/MYLANTA) 200-200-20 MG/5ML suspension 30 mL  30 mL Oral Q4H PRN White, Patrice L, NP       amoxicillin-clavulanate (AUGMENTIN) 875-125 MG per tablet 1 tablet  1 tablet Oral Q12H Carrion-Carrero, Margely, MD   1 tablet at 11/22/22 0918   benzocaine (ORAJEL) 10 % mucosal gel 1 Application  1 Application Mouth/Throat QID PRN Nelly Rout, MD       dicyclomine (BENTYL) tablet 20 mg  20 mg Oral Q6H PRN White, Patrice L, NP       diphenhydrAMINE (BENADRYL) injection 50 mg  50 mg Intramuscular Q12H PRN White, Patrice L, NP       hydrOXYzine (ATARAX) tablet 25 mg  25 mg Oral TID PRN White, Patrice L, NP   25 mg at 11/22/22 1419   ibuprofen (ADVIL) tablet 600 mg  600 mg Oral Q6H PRN Nelly Rout, MD   600 mg at 11/22/22 0646   loperamide (IMODIUM) capsule 2-4 mg  2-4 mg Oral PRN White, Patrice L, NP       OLANZapine zydis (ZYPREXA) disintegrating tablet 10 mg  10 mg Oral Q8H PRN Carrion-Carrero, Margely, MD   10 mg at 11/22/22 1111   And   LORazepam (ATIVAN) tablet 1 mg  1 mg Oral PRN Carrion-Carrero, Margely, MD       And   ziprasidone (GEODON) injection 20 mg  20 mg Intramuscular PRN Carrion-Carrero, Karle Starch, MD       [START ON 11/23/2022] LORazepam (ATIVAN) tablet 1 mg  1 mg Oral Daily Carrion-Carrero, Karle Starch, MD  magnesium hydroxide (MILK OF MAGNESIA) suspension 30 mL  30 mL Oral Daily PRN White, Patrice L, NP       methocarbamol (ROBAXIN) tablet 500 mg  500 mg Oral Q8H PRN White, Patrice L, NP   500 mg at 11/20/22 2119   multivitamin with minerals tablet 1 tablet  1 tablet Oral Daily White, Patrice L, NP   1 tablet at 11/22/22 0919   ondansetron (ZOFRAN-ODT) disintegrating tablet 4 mg  4 mg Oral Q6H PRN White, Patrice L, NP       OXcarbazepine (TRILEPTAL) tablet 150 mg  150 mg Oral BID Nelly Rout, MD   150 mg at 11/22/22 4696   thiamine (VITAMIN B1) tablet 100 mg  100 mg Oral Daily White, Patrice L, NP   100 mg at 11/22/22 0918   traZODone (DESYREL) tablet 50 mg  50 mg Oral QHS  PRN White, Patrice L, NP   50 mg at 11/21/22 2106   No current outpatient medications on file.    Labs  Lab Results:  Admission on 11/18/2022, Discharged on 11/19/2022  Component Date Value Ref Range Status   WBC 11/18/2022 3.9 (L)  4.0 - 10.5 K/uL Final   RBC 11/18/2022 4.58  3.87 - 5.11 MIL/uL Final   Hemoglobin 11/18/2022 11.8 (L)  12.0 - 15.0 g/dL Final   HCT 29/52/8413 37.7  36.0 - 46.0 % Final   MCV 11/18/2022 82.3  80.0 - 100.0 fL Final   MCH 11/18/2022 25.8 (L)  26.0 - 34.0 pg Final   MCHC 11/18/2022 31.3  30.0 - 36.0 g/dL Final   RDW 24/40/1027 14.0  11.5 - 15.5 % Final   Platelets 11/18/2022 173  150 - 400 K/uL Final   nRBC 11/18/2022 0.0  0.0 - 0.2 % Final   Neutrophils Relative % 11/18/2022 40  % Final   Neutro Abs 11/18/2022 1.5 (L)  1.7 - 7.7 K/uL Final   Lymphocytes Relative 11/18/2022 42  % Final   Lymphs Abs 11/18/2022 1.6  0.7 - 4.0 K/uL Final   Monocytes Relative 11/18/2022 7  % Final   Monocytes Absolute 11/18/2022 0.3  0.1 - 1.0 K/uL Final   Eosinophils Relative 11/18/2022 10  % Final   Eosinophils Absolute 11/18/2022 0.4  0.0 - 0.5 K/uL Final   Basophils Relative 11/18/2022 1  % Final   Basophils Absolute 11/18/2022 0.0  0.0 - 0.1 K/uL Final   Immature Granulocytes 11/18/2022 0  % Final   Abs Immature Granulocytes 11/18/2022 0.01  0.00 - 0.07 K/uL Final   Performed at [facility], [address], [place], [place] [address]   Sodium 11/18/2022 142  135 - 145 mmol/L Final   Potassium 11/18/2022 4.0  3.5 - 5.1 mmol/L Final   Chloride 11/18/2022 107  98 - 111 mmol/L Final   CO2 11/18/2022 26  22 - 32 mmol/L Final   Glucose, Bld 11/18/2022 74  70 - 99 mg/dL Final   Glucose reference range applies only to samples taken after fasting for at least 8 hours.   BUN 11/18/2022 10  6 - 20 mg/dL Final   Creatinine, Ser 11/18/2022 0.64  0.44 - 1.00 mg/dL Final   Calcium 44/03/4740 8.6 (L)  8.9 - 10.3 mg/dL Final   Total Protein 59/56/3875 5.7 (L)  6.5 - 8.1 g/dL  Final   Albumin 64/33/2951 3.6  3.5 - 5.0 g/dL Final   AST 88/41/6606 82 (H)  15 - 41 U/L Final   ALT 11/18/2022 57 (H)  0 - 44 U/L Final   Alkaline Phosphatase 11/18/2022 69  38 - 126 U/L Final   Total Bilirubin 11/18/2022 0.4  0.3 - 1.2 mg/dL Final   GFR, Estimated 11/18/2022 >60  >60 mL/min Final   Comment: (NOTE) Calculated using the CKD-EPI Creatinine Equation (2021)    Anion gap 11/18/2022 9  5 - 15 Final   Performed at [facility], [address] [place], [place] [address]   Preg Test, Ur 11/18/2022 Negative  Negative Final   POC Amphetamine UR 11/18/2022 None Detected  NONE DETECTED (Cut Off Level 1000 ng/mL) Final   POC Secobarbital (BAR) 11/18/2022 None Detected  NONE DETECTED (Cut Off Level 300 ng/mL) Final   POC Buprenorphine (BUP) 11/18/2022 None Detected  NONE DETECTED (Cut Off Level 10 ng/mL) Final   POC Oxazepam (BZO) 11/18/2022 Positive (A)  NONE DETECTED (Cut Off Level 300 ng/mL) Final   POC Cocaine UR 11/18/2022 None Detected  NONE DETECTED (Cut Off Level 300 ng/mL) Final   POC Methamphetamine UR 11/18/2022 None Detected  NONE DETECTED (Cut Off Level 1000 ng/mL) Final   POC Morphine 11/18/2022 None Detected  NONE DETECTED (Cut Off Level 300 ng/mL) Final   POC Methadone UR 11/18/2022 None Detected  NONE DETECTED (Cut Off Level 300 ng/mL) Final   POC Oxycodone UR 11/18/2022 None Detected  NONE DETECTED (Cut Off Level 100 ng/mL) Final   POC Marijuana UR 11/18/2022 None Detected  NONE DETECTED (Cut Off Level 50 ng/mL) Final  Admission on 10/08/2022, Discharged on 10/10/2022  Component Date Value Ref Range Status   WBC 10/09/2022 3.6 (L)  4.0 - 10.5 K/uL Final   RBC 10/09/2022 4.83  3.87 - 5.11 MIL/uL Final   Hemoglobin 10/09/2022 12.4  12.0 - 15.0 g/dL Final   HCT 19/14/7829 39.2  36.0 - 46.0 % Final   MCV 10/09/2022 81.2  80.0 - 100.0 fL Final   MCH 10/09/2022 25.7 (L)  26.0 - 34.0 pg Final   MCHC 10/09/2022 31.6  30.0 - 36.0 g/dL Final   RDW 56/21/3086 14.3   11.5 - 15.5 % Final   Platelets 10/09/2022 190  150 - 400 K/uL Final   nRBC 10/09/2022 0.0  0.0 - 0.2 % Final   Neutrophils Relative % 10/09/2022 43  % Final   Neutro Abs 10/09/2022 1.5 (L)  1.7 - 7.7 K/uL Final   Lymphocytes Relative 10/09/2022 45  % Final   Lymphs Abs 10/09/2022 1.6  0.7 - 4.0 K/uL Final   Monocytes Relative 10/09/2022 7  % Final   Monocytes Absolute 10/09/2022 0.2  0.1 - 1.0 K/uL Final   Eosinophils Relative 10/09/2022 4  % Final   Eosinophils Absolute 10/09/2022 0.1  0.0 - 0.5 K/uL Final   Basophils Relative 10/09/2022 1  % Final   Basophils Absolute 10/09/2022 0.0  0.0 - 0.1 K/uL Final   Immature Granulocytes 10/09/2022 0  % Final   Abs Immature Granulocytes 10/09/2022 0.00  0.00 - 0.07 K/uL Final   Performed at [facility], [address], [place], [place] [address]   Sodium 10/09/2022 141  135 - 145 mmol/L Final   Potassium 10/09/2022 4.6  3.5 - 5.1 mmol/L Final   Chloride 10/09/2022 101  98 - 111 mmol/L Final   CO2 10/09/2022 27  22 - 32 mmol/L Final   Glucose, Bld 10/09/2022 80  70 - 99 mg/dL Final   Glucose reference range applies only to samples taken after fasting for at least 8 hours.  BUN 10/09/2022 8  6 - 20 mg/dL Final   Creatinine, Ser 10/09/2022 0.70  0.44 - 1.00 mg/dL Final   Calcium 86/57/8469 9.3  8.9 - 10.3 mg/dL Final   Total Protein 62/95/2841 6.0 (L)  6.5 - 8.1 g/dL Final   Albumin 32/44/0102 4.0  3.5 - 5.0 g/dL Final   AST 72/53/6644 16  15 - 41 U/L Final   ALT 10/09/2022 14  0 - 44 U/L Final   Alkaline Phosphatase 10/09/2022 48  38 - 126 U/L Final   Total Bilirubin 10/09/2022 0.8  0.3 - 1.2 mg/dL Final   GFR, Estimated 10/09/2022 >60  >60 mL/min Final   Comment: (NOTE) Calculated using the CKD-EPI Creatinine Equation (2021)    Anion gap 10/09/2022 13  5 - 15 Final   Performed at [facility], [address] [place], [place] [address]   Alcohol, Ethyl (B) 10/09/2022 <10  <10 mg/dL Final   Comment: (NOTE) Lowest  detectable limit for serum alcohol is 10 mg/dL.  For medical purposes only. Performed at [facility], [address] [place], [place] [address]    TSH 10/09/2022 0.553  0.350 - 4.500 uIU/mL Final   Comment: Performed by a 3rd Generation assay with a functional sensitivity of <=0.01 uIU/mL. Performed at [facility], [address], [place], [place] [address]    Preg Test, Ur 10/10/2022 Negative  Negative Final   POC Amphetamine UR 10/10/2022 None Detected  NONE DETECTED (Cut Off Level 1000 ng/mL) Final   POC Secobarbital (BAR) 10/10/2022 None Detected  NONE DETECTED (Cut Off Level 300 ng/mL) Final   POC Buprenorphine (BUP) 10/10/2022 None Detected  NONE DETECTED (Cut Off Level 10 ng/mL) Final   POC Oxazepam (BZO) 10/10/2022 None Detected  NONE DETECTED (Cut Off Level 300 ng/mL) Final   POC Cocaine UR 10/10/2022 Positive (A)  NONE DETECTED (Cut Off Level 300 ng/mL) Final   POC Methamphetamine UR 10/10/2022 None Detected  NONE DETECTED (Cut Off Level 1000 ng/mL) Final   POC Morphine 10/10/2022 None Detected  NONE DETECTED (Cut Off Level 300 ng/mL) Final   POC Methadone UR 10/10/2022 None Detected  NONE DETECTED (Cut Off Level 300 ng/mL) Final   POC Oxycodone UR 10/10/2022 None Detected  NONE DETECTED (Cut Off Level 100 ng/mL) Final   POC Marijuana UR 10/10/2022 Positive (A)  NONE DETECTED (Cut Off Level 50 ng/mL) Final   HIV Screen 4th Generation wRfx 10/09/2022 Non Reactive  Non Reactive Final   Performed at [facility], [address], [place], [place] [address]   Hepatitis B Surface Ag 10/09/2022 NON REACTIVE  NON REACTIVE Final   HCV Ab 10/09/2022 NON REACTIVE  NON REACTIVE Final   Comment: (NOTE) Nonreactive HCV antibody screen is consistent with no HCV infections,  unless recent infection is suspected or other evidence exists to indicate HCV infection.     Hep A IgM 10/09/2022 NON REACTIVE  NON REACTIVE Final   Hep B C IgM 10/09/2022 NON REACTIVE  NON REACTIVE  Final   Performed at [facility], [address] [place], [place] [address]   RPR Ser Ql 10/09/2022 NON REACTIVE  NON REACTIVE Final   Performed at [facility], [address] [place], [place] [address]  Admission on 09/16/2022, Discharged on 09/17/2022  Component Date Value Ref Range Status   WBC 09/16/2022 4.8  4.0 - 10.5 K/uL Final   RBC 09/16/2022 4.76  3.87 - 5.11 MIL/uL  Final   Hemoglobin 09/16/2022 12.0  12.0 - 15.0 g/dL Final   HCT 16/10/9602 39.3  36.0 - 46.0 % Final   MCV 09/16/2022 82.6  80.0 - 100.0 fL Final   MCH 09/16/2022 25.2 (L)  26.0 - 34.0 pg Final   MCHC 09/16/2022 30.5  30.0 - 36.0 g/dL Final   RDW 54/09/8117 14.3  11.5 - 15.5 % Final   Platelets 09/16/2022 222  150 - 400 K/uL Final   nRBC 09/16/2022 0.0  0.0 - 0.2 % Final   Neutrophils Relative % 09/16/2022 47  % Final   Neutro Abs 09/16/2022 2.2  1.7 - 7.7 K/uL Final   Lymphocytes Relative 09/16/2022 44  % Final   Lymphs Abs 09/16/2022 2.1  0.7 - 4.0 K/uL Final   Monocytes Relative 09/16/2022 6  % Final   Monocytes Absolute 09/16/2022 0.3  0.1 - 1.0 K/uL Final   Eosinophils Relative 09/16/2022 2  % Final   Eosinophils Absolute 09/16/2022 0.1  0.0 - 0.5 K/uL Final   Basophils Relative 09/16/2022 1  % Final   Basophils Absolute 09/16/2022 0.0  0.0 - 0.1 K/uL Final   Immature Granulocytes 09/16/2022 0  % Final   Abs Immature Granulocytes 09/16/2022 0.01  0.00 - 0.07 K/uL Final   Performed at [facility], [address], [place], [place] [address]   Sodium 09/16/2022 140  135 - 145 mmol/L Final   Potassium 09/16/2022 3.4 (L)  3.5 - 5.1 mmol/L Final   Chloride 09/16/2022 102  98 - 111 mmol/L Final   CO2 09/16/2022 29  22 - 32 mmol/L Final   Glucose, Bld 09/16/2022 115 (H)  70 - 99 mg/dL Final   Glucose reference range applies only to samples taken after fasting for at least 8 hours.   BUN 09/16/2022 25 (H)  6 - 20 mg/dL Final   Creatinine, Ser 09/16/2022 0.90  0.44 - 1.00 mg/dL Final    Calcium 95/62/1308 9.0  8.9 - 10.3 mg/dL Final   Total Protein 65/78/4696 6.4 (L)  6.5 - 8.1 g/dL Final   Albumin 29/52/8413 4.1  3.5 - 5.0 g/dL Final   AST 24/40/1027 15  15 - 41 U/L Final   ALT 09/16/2022 15  0 - 44 U/L Final   Alkaline Phosphatase 09/16/2022 43  38 - 126 U/L Final   Total Bilirubin 09/16/2022 0.4  0.3 - 1.2 mg/dL Final   GFR, Estimated 09/16/2022 >60  >60 mL/min Final   Comment: (NOTE) Calculated using the CKD-EPI Creatinine Equation (2021)    Anion gap 09/16/2022 9  5 - 15 Final   Performed at [facility], [address], [place], [place] [address]   Opiates 09/16/2022 POSITIVE (A)  NONE DETECTED Final   Cocaine 09/16/2022 POSITIVE (A)  NONE DETECTED Final   Benzodiazepines 09/16/2022 POSITIVE (A)  NONE DETECTED Final   Amphetamines 09/16/2022 NONE DETECTED  NONE DETECTED Final   Tetrahydrocannabinol 09/16/2022 NONE DETECTED  NONE DETECTED Final   Barbiturates 09/16/2022 NONE DETECTED  NONE DETECTED Final   Comment: (NOTE) DRUG SCREEN FOR MEDICAL PURPOSES ONLY.  IF CONFIRMATION IS NEEDED FOR ANY PURPOSE, NOTIFY LAB WITHIN 5 DAYS.  LOWEST DETECTABLE LIMITS FOR URINE DRUG SCREEN Drug Class                     Cutoff (ng/mL) Amphetamine and metabolites    1000 Barbiturate and metabolites    200 Benzodiazepine  200 Opiates and metabolites        300 Cocaine and metabolites        300 THC                            50 Performed at [facility], [address] [place], [place] [address]    Preg, Serum 09/16/2022 NEGATIVE  NEGATIVE Final   Comment:        THE SENSITIVITY OF THIS METHODOLOGY IS >10 mIU/mL. Performed at [facility], [address] [place], [place] [address]   Admission on 09/12/2022, Discharged on 09/13/2022  Component Date Value Ref Range Status   Sodium 09/12/2022 138  135 - 145 mmol/L Final   Potassium 09/12/2022 4.8  3.5 - 5.1 mmol/L Final   Chloride 09/12/2022 102  98 - 111 mmol/L Final   CO2  09/12/2022 27  22 - 32 mmol/L Final   Glucose, Bld 09/12/2022 93  70 - 99 mg/dL Final   Glucose reference range applies only to samples taken after fasting for at least 8 hours.   BUN 09/12/2022 13  6 - 20 mg/dL Final   Creatinine, Ser 09/12/2022 0.57  0.44 - 1.00 mg/dL Final   Calcium 86/57/8469 9.6  8.9 - 10.3 mg/dL Final   Total Protein 62/95/2841 7.6  6.5 - 8.1 g/dL Final   Albumin 32/44/0102 4.5  3.5 - 5.0 g/dL Final   AST 72/53/6644 24  15 - 41 U/L Final   ALT 09/12/2022 23  0 - 44 U/L Final   Alkaline Phosphatase 09/12/2022 58  38 - 126 U/L Final   Total Bilirubin 09/12/2022 0.5  0.3 - 1.2 mg/dL Final   GFR, Estimated 09/12/2022 >60  >60 mL/min Final   Comment: (NOTE) Calculated using the CKD-EPI Creatinine Equation (2021)    Anion gap 09/12/2022 9  5 - 15 Final   Performed at [facility], [address] [place], [place] [address]   Alcohol, Ethyl (B) 09/12/2022 <10  <10 mg/dL Final   Comment: (NOTE) Lowest detectable limit for serum alcohol is 10 mg/dL.  For medical purposes only. Performed at [facility], [address] [place], [place] [address]    WBC 09/12/2022 3.3 (L)  4.0 - 10.5 K/uL Final   RBC 09/12/2022 5.06  3.87 - 5.11 MIL/uL Final   Hemoglobin 09/12/2022 13.1  12.0 - 15.0 g/dL Final   HCT 38/75/6433 42.4  36.0 - 46.0 % Final   MCV 09/12/2022 83.8  80.0 - 100.0 fL Final   MCH 09/12/2022 25.9 (L)  26.0 - 34.0 pg Final   MCHC 09/12/2022 30.9  30.0 - 36.0 g/dL Final   RDW 29/51/8841 14.1  11.5 - 15.5 % Final   Platelets 09/12/2022 211  150 - 400 K/uL Final   nRBC 09/12/2022 0.0  0.0 - 0.2 % Final   Performed at [facility], [address] [place], [place] [address]   Opiates 09/12/2022 POSITIVE (A)  NONE DETECTED Final   Cocaine 09/12/2022 POSITIVE (A)  NONE DETECTED Final   Benzodiazepines 09/12/2022 POSITIVE (A)  NONE DETECTED Final   Amphetamines 09/12/2022 NONE DETECTED  NONE DETECTED Final    Tetrahydrocannabinol 09/12/2022 NONE DETECTED  NONE DETECTED Final   Barbiturates 09/12/2022 NONE DETECTED  NONE DETECTED Final   Comment: (NOTE) DRUG SCREEN FOR MEDICAL PURPOSES ONLY.  IF CONFIRMATION IS NEEDED FOR ANY PURPOSE, NOTIFY LAB WITHIN 5 DAYS.  LOWEST DETECTABLE LIMITS FOR URINE DRUG SCREEN Drug Class                     Cutoff (ng/mL) Amphetamine and metabolites    1000 Barbiturate and metabolites    200 Benzodiazepine                 200 Opiates and metabolites        300 Cocaine and metabolites        300 THC                            50 Performed at [facility], [address] [place], [place] [address]    Preg, Serum 09/12/2022 NEGATIVE  NEGATIVE Final   Comment:        THE SENSITIVITY OF THIS METHODOLOGY IS >10 mIU/mL. Performed at [facility], [address], [place], [place] [address]    Specimen Source 09/12/2022 URINE, CLEAN CATCH   Final   Color, Urine 09/12/2022 YELLOW  YELLOW Final   APPearance 09/12/2022 CLEAR  CLEAR Final   Specific Gravity, Urine 09/12/2022 1.023  1.005 - 1.030 Final   pH 09/12/2022 6.0  5.0 - 8.0 Final   Glucose, UA 09/12/2022 NEGATIVE  NEGATIVE mg/dL Final   Hgb urine dipstick 09/12/2022 NEGATIVE  NEGATIVE Final   Bilirubin Urine 09/12/2022 NEGATIVE  NEGATIVE Final   Ketones, ur 09/12/2022 NEGATIVE  NEGATIVE mg/dL Final   Protein, ur 17/61/6073 NEGATIVE  NEGATIVE mg/dL Final   Nitrite 71/06/2692 NEGATIVE  NEGATIVE Final   Leukocytes,Ua 09/12/2022 NEGATIVE  NEGATIVE Final   RBC / HPF 09/12/2022 0-5  0 - 5 RBC/hpf Final   WBC, UA 09/12/2022 0-5  0 - 5 WBC/hpf Final   Comment:        Reflex urine culture not performed if WBC <=10, OR if Squamous epithelial cells >5. If Squamous epithelial cells >5 suggest recollection.    Bacteria, UA 09/12/2022 NONE SEEN  NONE SEEN Final   Squamous Epithelial / HPF 09/12/2022 0-5  0 - 5 /HPF Final   Mucus 09/12/2022 PRESENT   Final   Performed at  [facility], [address], [place], [place] [address]    Blood Alcohol level:  Lab Results  Component Value Date   ETH <10 10/09/2022   ETH <10 09/12/2022    Metabolic Disorder Labs: Lab Results  Component Value Date   HGBA1C 5.1 08/26/2020   MPG 99.67 08/26/2020   MPG 108 04/20/2010   Lab Results  Component Value Date   PROLACTIN 9.4 03/16/2012   Lab Results  Component Value Date   CHOL 163 04/11/2017   TRIG 63 04/14/2022   HDL 71 04/11/2017   CHOLHDL 2.3 04/11/2017   VLDL 11 04/11/2017   LDLCALC 81 04/11/2017   LDLCALC 139 (H) 12/10/2012    Therapeutic Lab Levels: No results found for: "LITHIUM" Lab Results  Component Value Date   VALPROATE 22.2 (L) 03/19/2012   No results found for: "CBMZ"  Physical Findings   AIMS    Flowsheet Row Ad[facility] (Discharged[facility]  AIMS Total Score 0      AUDIT    Flowsheet Row Admission (Discharged) from [facility]  Alcohol Use Disorder Identification Test Final Score (AUDIT) 26      GAD-7  Flowsheet Row Routine [facility] Routine Prenatal from 07/11/2020 [facility] Routine Prenatal from 06/05/2020 in [facility] Video Visit from 03/13/2020 in [facility] Initial Prenatal from 02/14/2020 in [facility]  Total GAD-7 Score 11 7 14  0 2      PHQ2-9    Flowsheet Row ED from 11/19/2022 in [facility] ED from 10/08/2022 in [facility] Office Visit from 03/12/2021 in [facility] Office Visit from  02/12/2021 in [facility] Office Visit from 12/25/2020 in [facility]  PHQ-2 Total Score 3 6 3 1 5   PHQ-9 Total Score 13 15 7 6 14       Flowsheet Row ED from 11/19/2022 in [facility] ED from 11/18/2022 in [facility] ED from 10/08/2022 in [facility]  C-SSRS RISK CATEGORY High Risk No Risk No Risk      Musculoskeletal  Strength & Muscle Tone: within normal limits Gait & Station: normal Patient leans: N/A  Psychiatric Specialty Exam  Presentation General Appearance: Appropriate for Environment  Eye Contact:Fair  Speech:Clear and Coherent  Speech Volume:Normal  Handedness:-- (not assessed)   Mood and Affect  Mood:Euthymic  Affect:Congruent   Thought Process  Thought Processes:Coherent; Linear  Descriptions of Associations:Intact  Orientation:Full (Time, Place and Person)  Thought Content:Logical    Hallucinations:Hallucinations: None  Ideas of Reference:None  Suicidal Thoughts:Suicidal Thoughts: No  Homicidal Thoughts:Homicidal Thoughts: No   Sensorium  Memory:Immediate Fair; Recent Fair; Remote Fair  Judgment:Fair  Insight:Fair   Executive Functions  Concentration:Fair  Attention Span:Fair  Recall:Fair  Fund of Knowledge:Fair  Language:Fair   Psychomotor Activity  Psychomotor Activity:Psychomotor Activity: Normal   Assets  Assets:Communication Skills; Resilience   Sleep  Sleep:Sleep: Fair   Nutritional Assessment (For OBS and FBC admissions only) Has the patient had a weight loss or gain of 10 pounds or more in the last 3 months?: No Has the patient had a decrease in food intake/or appetite?: Yes Does the patient have dental problems?: No Does the patient have eating habits or behaviors that may be indicators of an eating disorder including binging or inducing vomiting?: No Has the patient  recently lost weight without trying?: 0 Has the patient been eating poorly because of a decreased appetite?: 0 Malnutrition Screening Tool Score: 0    Physical Exam Constitutional:      Appearance: the patient is not toxic-appearing.  Pulmonary:     Effort: Pulmonary effort is normal.  Neurological:     General: No focal deficit present.     Mental Status: the patient is alert and oriented to person, place, and time.   Review of Systems  Respiratory:  Negative for shortness of breath.   Cardiovascular:  Negative for chest pain.  Gastrointestinal:  Negative for abdominal pain, constipation, diarrhea, nausea and vomiting.  Neurological:  Negative for headaches.    BP 97/62   Pulse 75   Temp 98.4 F (36.9 C) (Oral)   Resp 18   SpO2 100%   Assessment and Plan:  Patient is under involuntary commitment.  Severe opioid use disorder, not currently in withdrawal - CIWA and COWS  Mood lability -  Continue Trileptal 150 mg twice daily  Oral infection - Augmentin for 7 days - Patient reports eating and drinking well  Disposition: Will arrange substance use treatment follow-up on Monday  Carlyn Reichert, MD 11/22/2022 3:52 PM

## 2022-11-22 NOTE — ED Notes (Signed)
Patient in the bedroom composed and resting. NAD. Respirations are even and unlabored. Will continue to monitor for safety.

## 2022-11-22 NOTE — ED Notes (Signed)
Pt tearful requesting medication for anxiety and requesting another MD stating, "they aren't helping me properly with my anxiety. I need Ativan more than they are giving it to me". Writer recommended pt try alternative coping skills to relieve anxiety (non-pharmacological) as Ativan can become addictive if not taken correctly and under supervision. Pt not receptive to listening. Pt received Vistaril 25mg . Pt returned to Dayroom. Will continue to monitor for safety.

## 2022-11-22 NOTE — ED Notes (Signed)
Patient is in the bedroom sleeping. NAD. Seen comfortable in the bed.  Will monitor for safety.
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
## 2022-11-23 DIAGNOSIS — F1120 Opioid dependence, uncomplicated: Secondary | ICD-10-CM | POA: Diagnosis not present

## 2022-11-23 DIAGNOSIS — F1310 Sedative, hypnotic or anxiolytic abuse, uncomplicated: Secondary | ICD-10-CM | POA: Diagnosis not present

## 2022-11-23 DIAGNOSIS — F1994 Other psychoactive substance use, unspecified with psychoactive substance-induced mood disorder: Secondary | ICD-10-CM | POA: Diagnosis not present

## 2022-11-23 DIAGNOSIS — B998 Other infectious disease: Secondary | ICD-10-CM | POA: Diagnosis not present

## 2022-11-23 MED ORDER — DULOXETINE HCL 30 MG PO CPEP
30.0000 mg | ORAL_CAPSULE | Freq: Every day | ORAL | Status: DC
  Administered 2022-11-23 – 2022-11-24 (×2): 30 mg via ORAL
  Filled 2022-11-23 (×2): qty 1

## 2022-11-23 NOTE — ED Notes (Signed)
atient is in the bedroom sleeping. NAD. Seen comfortable in the bed.  Will monitor for safety.

## 2022-11-23 NOTE — ED Notes (Signed)
Pt requested medication for increased anxiety. Pt received Vistaril 25mg . Tolerated well. Will continue to monitor for safety.

## 2022-11-23 NOTE — ED Notes (Signed)
Patient is in Dayroom at the moment interacting with other patients  Denies SI/HI/AVH. NAD. Respirations are even and unlabored. Will continue to monitor for safety.

## 2022-11-23 NOTE — ED Notes (Signed)
Pt sleeping in no acute distress. RR even and unlabored. Environment secured. Will continue to monitor for safety. 
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
## 2022-11-23 NOTE — ED Notes (Signed)
Pt approached nurses station requesting medication for anxiety after speaking with provider. Attempted encouraging pt to utilize nonpharmacological technique to managing increased anxiety but pt states, "that medicine you gave me earlier helps me. May I please have one if I can". Pt received Vistaril 25mg  for relief of anxiety. Pt appreciative. Will continue to monitor for safety.

## 2022-11-23 NOTE — ED Notes (Addendum)
Pt provided updated number for mother, [name], [phone]. MD made aware that pt's mother is requesting a call from him to discharge pt into her care. Safety maintained.
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
## 2022-11-23 NOTE — ED Notes (Signed)
Pt denies SI/HI/AVH. Pt apologetic about yesterday's behaviors toward staff. Praise and support given. Denies withdrawal sx from opioids. Pleasant with staff. Pt took all am medication except Trileptal. Pt states, "I cried way too much yesterday, I think it is that Trileptal that is acting negative in my body. I want to talk with the doctor and see if we can try something else". MD notified via secure chat of pt concerns. Will continue to monitor for safety.
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
## 2022-11-23 NOTE — ED Provider Notes (Addendum)
Behavioral Health Progress Note  Date and Time: 11/23/2022 10:33 AM Name: [name] MRN:  [id number]  Subjective:   The patient is a 27 year old female with a past psychiatric history of severe opioid use disorder, previous history of overdose in March 2024 requiring intubation and subsequent psychiatric hospitalization.  The patient presented to the [facility] behavioral urgent care under IVC by her mother.  It appears that her mother was worried about out-of-control substance use problems.  The involuntary commitment was upheld on admission to the facility based crisis.  On interview today, the patient is less tearful.  She feels that her Trileptal was making her excessively emotional, so she refused the medication this morning.  She states that she will continue to refuse the medication.  We discussed that it is unlikely that Trileptal was causing her emotional issues.  She reports previously being on a combination of Cymbalta and hydroxyzine and found this regimen helpful.  Bipolar affective disorder screening is negative.  On my review of the chart there is little evidence of a bipolar diagnosis.  The patient denies experiencing any withdrawal symptoms.  She states that she is eating well.  She actually denies experiencing any depression and says that anxiety is her predominant symptom.  She denies experiencing any suicidal thoughts.  She has denied suicidal thoughts since admission, it appears.  Addendum: patient requested that her mother be contacted and her care discussed. I called her mother, [name], at an updated number, [phone]. All questions were answered.   Diagnosis:  Final diagnoses:  Substance induced mood disorder (HCC)  Opioid type dependence, continuous (HCC)  Mild benzodiazepine use disorder (HCC)    Total Time spent with patient: 20 minutes  Past Psychiatric History: as above Past Medical History: as above Family History: none Family Psychiatric   History: none Social History: as above and per H and P  Additional Social History:  See H and P                  Sleep: Fair  Appetite:  Fair   Current Medications:  Current Facility-Administered Medications  Medication Dose Route Frequency Provider Last Rate Last Admin   alum & mag hydroxide-simeth (MAALOX/MYLANTA) 200-200-20 MG/5ML suspension 30 mL  30 mL Oral Q4H PRN White, Patrice L, NP       amoxicillin-clavulanate (AUGMENTIN) 875-125 MG per tablet 1 tablet  1 tablet Oral Q12H Carrion-Carrero, Margely, MD   1 tablet at 11/23/22 0920   benzocaine (ORAJEL) 10 % mucosal gel 1 Application  1 Application Mouth/Throat QID PRN Nelly Rout, MD       dicyclomine (BENTYL) tablet 20 mg  20 mg Oral Q6H PRN White, Patrice L, NP       diphenhydrAMINE (BENADRYL) injection 50 mg  50 mg Intramuscular Q12H PRN White, Patrice L, NP       hydrOXYzine (ATARAX) tablet 25 mg  25 mg Oral TID PRN White, Patrice L, NP   25 mg at 11/22/22 2204   ibuprofen (ADVIL) tablet 600 mg  600 mg Oral Q6H PRN Nelly Rout, MD   600 mg at 11/22/22 2207   loperamide (IMODIUM) capsule 2-4 mg  2-4 mg Oral PRN White, Patrice L, NP       OLANZapine zydis (ZYPREXA) disintegrating tablet 10 mg  10 mg Oral Q8H PRN Carrion-Carrero, Margely, MD   10 mg at 11/22/22 2100   And   LORazepam (ATIVAN) tablet 1 mg  1 mg Oral PRN Lorri Frederick, MD  And   ziprasidone (GEODON) injection 20 mg  20 mg Intramuscular PRN Carrion-Carrero, Margely, MD       magnesium hydroxide (MILK OF MAGNESIA) suspension 30 mL  30 mL Oral Daily PRN White, Patrice L, NP       methocarbamol (ROBAXIN) tablet 500 mg  500 mg Oral Q8H PRN White, Patrice L, NP   500 mg at 11/20/22 2119   multivitamin with minerals tablet 1 tablet  1 tablet Oral Daily White, Patrice L, NP   1 tablet at 11/23/22 0920   ondansetron (ZOFRAN-ODT) disintegrating tablet 4 mg  4 mg Oral Q6H PRN White, Patrice L, NP       OXcarbazepine (TRILEPTAL) tablet 150 mg   150 mg Oral BID Nelly Rout, MD   150 mg at 11/22/22 2100   thiamine (VITAMIN B1) tablet 100 mg  100 mg Oral Daily White, Patrice L, NP   100 mg at 11/23/22 0920   traZODone (DESYREL) tablet 50 mg  50 mg Oral QHS PRN White, Patrice L, NP   50 mg at 11/22/22 2100   No current outpatient medications on file.    Labs  Lab Results:  Admission on 11/18/2022, Discharged on 11/19/2022  Component Date Value Ref Range Status   WBC 11/18/2022 3.9 (L)  4.0 - 10.5 K/uL Final   RBC 11/18/2022 4.58  3.87 - 5.11 MIL/uL Final   Hemoglobin 11/18/2022 11.8 (L)  12.0 - 15.0 g/dL Final   HCT 21/30/8657 37.7  36.0 - 46.0 % Final   MCV 11/18/2022 82.3  80.0 - 100.0 fL Final   MCH 11/18/2022 25.8 (L)  26.0 - 34.0 pg Final   MCHC 11/18/2022 31.3  30.0 - 36.0 g/dL Final   RDW 84/69/6295 14.0  11.5 - 15.5 % Final   Platelets 11/18/2022 173  150 - 400 K/uL Final   nRBC 11/18/2022 0.0  0.0 - 0.2 % Final   Neutrophils Relative % 11/18/2022 40  % Final   Neutro Abs 11/18/2022 1.5 (L)  1.7 - 7.7 K/uL Final   Lymphocytes Relative 11/18/2022 42  % Final   Lymphs Abs 11/18/2022 1.6  0.7 - 4.0 K/uL Final   Monocytes Relative 11/18/2022 7  % Final   Monocytes Absolute 11/18/2022 0.3  0.1 - 1.0 K/uL Final   Eosinophils Relative 11/18/2022 10  % Final   Eosinophils Absolute 11/18/2022 0.4  0.0 - 0.5 K/uL Final   Basophils Relative 11/18/2022 1  % Final   Basophils Absolute 11/18/2022 0.0  0.0 - 0.1 K/uL Final   Immature Granulocytes 11/18/2022 0  % Final   Abs Immature Granulocytes 11/18/2022 0.01  0.00 - 0.07 K/uL Final   Performed at [facility], [address], [place], [place] [address]   Sodium 11/18/2022 142  135 - 145 mmol/L Final   Potassium 11/18/2022 4.0  3.5 - 5.1 mmol/L Final   Chloride 11/18/2022 107  98 - 111 mmol/L Final   CO2 11/18/2022 26  22 - 32 mmol/L Final   Glucose, Bld 11/18/2022 74  70 - 99 mg/dL Final   Glucose reference range applies only to samples taken after fasting for at  least 8 hours.   BUN 11/18/2022 10  6 - 20 mg/dL Final   Creatinine, Ser 11/18/2022 0.64  0.44 - 1.00 mg/dL Final   Calcium 24/40/1027 8.6 (L)  8.9 - 10.3 mg/dL Final   Total Protein 25/36/6440 5.7 (L)  6.5 - 8.1 g/dL Final   Albumin 34/74/2595 3.6  3.5 -  5.0 g/dL Final   AST 69/62/9528 82 (H)  15 - 41 U/L Final   ALT 11/18/2022 57 (H)  0 - 44 U/L Final   Alkaline Phosphatase 11/18/2022 69  38 - 126 U/L Final   Total Bilirubin 11/18/2022 0.4  0.3 - 1.2 mg/dL Final   GFR, Estimated 11/18/2022 >60  >60 mL/min Final   Comment: (NOTE) Calculated using the CKD-EPI Creatinine Equation (2021)    Anion gap 11/18/2022 9  5 - 15 Final   Performed at [facility], [address], [place], [place] [address]   Preg Test, Ur 11/18/2022 Negative  Negative Final   POC Amphetamine UR 11/18/2022 None Detected  NONE DETECTED (Cut Off Level 1000 ng/mL) Final   POC Secobarbital (BAR) 11/18/2022 None Detected  NONE DETECTED (Cut Off Level 300 ng/mL) Final   POC Buprenorphine (BUP) 11/18/2022 None Detected  NONE DETECTED (Cut Off Level 10 ng/mL) Final   POC Oxazepam (BZO) 11/18/2022 Positive (A)  NONE DETECTED (Cut Off Level 300 ng/mL) Final   POC Cocaine UR 11/18/2022 None Detected  NONE DETECTED (Cut Off Level 300 ng/mL) Final   POC Methamphetamine UR 11/18/2022 None Detected  NONE DETECTED (Cut Off Level 1000 ng/mL) Final   POC Morphine 11/18/2022 None Detected  NONE DETECTED (Cut Off Level 300 ng/mL) Final   POC Methadone UR 11/18/2022 None Detected  NONE DETECTED (Cut Off Level 300 ng/mL) Final   POC Oxycodone UR 11/18/2022 None Detected  NONE DETECTED (Cut Off Level 100 ng/mL) Final   POC Marijuana UR 11/18/2022 None Detected  NONE DETECTED (Cut Off Level 50 ng/mL) Final  Admission on 10/08/2022, Discharged on 10/10/2022  Component Date Value Ref Range Status   WBC 10/09/2022 3.6 (L)  4.0 - 10.5 K/uL Final   RBC 10/09/2022 4.83  3.87 - 5.11 MIL/uL Final   Hemoglobin 10/09/2022 12.4  12.0 - 15.0  g/dL Final   HCT 40/10/2723 39.2  36.0 - 46.0 % Final   MCV 10/09/2022 81.2  80.0 - 100.0 fL Final   MCH 10/09/2022 25.7 (L)  26.0 - 34.0 pg Final   MCHC 10/09/2022 31.6  30.0 - 36.0 g/dL Final   RDW 36/64/4034 14.3  11.5 - 15.5 % Final   Platelets 10/09/2022 190  150 - 400 K/uL Final   nRBC 10/09/2022 0.0  0.0 - 0.2 % Final   Neutrophils Relative % 10/09/2022 43  % Final   Neutro Abs 10/09/2022 1.5 (L)  1.7 - 7.7 K/uL Final   Lymphocytes Relative 10/09/2022 45  % Final   Lymphs Abs 10/09/2022 1.6  0.7 - 4.0 K/uL Final   Monocytes Relative 10/09/2022 7  % Final   Monocytes Absolute 10/09/2022 0.2  0.1 - 1.0 K/uL Final   Eosinophils Relative 10/09/2022 4  % Final   Eosinophils Absolute 10/09/2022 0.1  0.0 - 0.5 K/uL Final   Basophils Relative 10/09/2022 1  % Final   Basophils Absolute 10/09/2022 0.0  0.0 - 0.1 K/uL Final   Immature Granulocytes 10/09/2022 0  % Final   Abs Immature Granulocytes 10/09/2022 0.00  0.00 - 0.07 K/uL Final   Performed at [facility], [address] [place], [place] [address]   Sodium 10/09/2022 141  135 - 145 mmol/L Final   Potassium 10/09/2022 4.6  3.5 - 5.1 mmol/L Final   Chloride 10/09/2022 101  98 - 111 mmol/L Final   CO2 10/09/2022 27  22 - 32 mmol/L Final   Glucose, Bld 10/09/2022 80  70 - 99 mg/dL Final   Glucose reference range applies only to samples taken after fasting for at least 8 hours.   BUN 10/09/2022 8  6 - 20 mg/dL Final   Creatinine, Ser 10/09/2022 0.70  0.44 - 1.00 mg/dL Final   Calcium 30/86/5784 9.3  8.9 - 10.3 mg/dL Final   Total Protein 69/62/9528 6.0 (L)  6.5 - 8.1 g/dL Final   Albumin 41/32/4401 4.0  3.5 - 5.0 g/dL Final   AST 02/72/5366 16  15 - 41 U/L Final   ALT 10/09/2022 14  0 - 44 U/L Final   Alkaline Phosphatase 10/09/2022 48  38 - 126 U/L Final   Total Bilirubin 10/09/2022 0.8  0.3 - 1.2 mg/dL Final   GFR, Estimated 10/09/2022 >60  >60 mL/min Final   Comment: (NOTE) Calculated using the CKD-EPI Creatinine  Equation (2021)    Anion gap 10/09/2022 13  5 - 15 Final   Performed at [facility], [address] [place], [place] [address]   Alcohol, Ethyl (B) 10/09/2022 <10  <10 mg/dL Final   Comment: (NOTE) Lowest detectable limit for serum alcohol is 10 mg/dL.  For medical purposes only. Performed at [facility], [address], [place], [place] [address]    TSH 10/09/2022 0.553  0.350 - 4.500 uIU/mL Final   Comment: Performed by a 3rd Generation assay with a functional sensitivity of <=0.01 uIU/mL. Performed at [facility], [address], [place], [place] [address]    Preg Test, Ur 10/10/2022 Negative  Negative Final   POC Amphetamine UR 10/10/2022 None Detected  NONE DETECTED (Cut Off Level 1000 ng/mL) Final   POC Secobarbital (BAR) 10/10/2022 None Detected  NONE DETECTED (Cut Off Level 300 ng/mL) Final   POC Buprenorphine (BUP) 10/10/2022 None Detected  NONE DETECTED (Cut Off Level 10 ng/mL) Final   POC Oxazepam (BZO) 10/10/2022 None Detected  NONE DETECTED (Cut Off Level 300 ng/mL) Final   POC Cocaine UR 10/10/2022 Positive (A)  NONE DETECTED (Cut Off Level 300 ng/mL) Final   POC Methamphetamine UR 10/10/2022 None Detected  NONE DETECTED (Cut Off Level 1000 ng/mL) Final   POC Morphine 10/10/2022 None Detected  NONE DETECTED (Cut Off Level 300 ng/mL) Final   POC Methadone UR 10/10/2022 None Detected  NONE DETECTED (Cut Off Level 300 ng/mL) Final   POC Oxycodone UR 10/10/2022 None Detected  NONE DETECTED (Cut Off Level 100 ng/mL) Final   POC Marijuana UR 10/10/2022 Positive (A)  NONE DETECTED (Cut Off Level 50 ng/mL) Final   HIV Screen 4th Generation wRfx 10/09/2022 Non Reactive  Non Reactive Final   Performed at [facility], [address], [place], [place] [address]   Hepatitis B Surface Ag 10/09/2022 NON REACTIVE  NON REACTIVE Final   HCV Ab 10/09/2022 NON REACTIVE  NON REACTIVE Final   Comment: (NOTE) Nonreactive HCV antibody screen is consistent with no  HCV infections,  unless recent infection is suspected or other evidence exists to indicate HCV infection.     Hep A IgM 10/09/2022 NON REACTIVE  NON REACTIVE Final   Hep B C IgM 10/09/2022 NON REACTIVE  NON REACTIVE Final   Performed at [facility], [address], [place], [place] [address]   RPR Ser Ql 10/09/2022 NON REACTIVE  NON REACTIVE Final   Performed at [facility], [address] [place], [place] [address]  Admission on 09/16/2022, Discharged on 09/17/2022  Component Date Value  Ref Range Status   WBC 09/16/2022 4.8  4.0 - 10.5 K/uL Final   RBC 09/16/2022 4.76  3.87 - 5.11 MIL/uL Final   Hemoglobin 09/16/2022 12.0  12.0 - 15.0 g/dL Final   HCT 82/95/6213 39.3  36.0 - 46.0 % Final   MCV 09/16/2022 82.6  80.0 - 100.0 fL Final   MCH 09/16/2022 25.2 (L)  26.0 - 34.0 pg Final   MCHC 09/16/2022 30.5  30.0 - 36.0 g/dL Final   RDW 08/65/7846 14.3  11.5 - 15.5 % Final   Platelets 09/16/2022 222  150 - 400 K/uL Final   nRBC 09/16/2022 0.0  0.0 - 0.2 % Final   Neutrophils Relative % 09/16/2022 47  % Final   Neutro Abs 09/16/2022 2.2  1.7 - 7.7 K/uL Final   Lymphocytes Relative 09/16/2022 44  % Final   Lymphs Abs 09/16/2022 2.1  0.7 - 4.0 K/uL Final   Monocytes Relative 09/16/2022 6  % Final   Monocytes Absolute 09/16/2022 0.3  0.1 - 1.0 K/uL Final   Eosinophils Relative 09/16/2022 2  % Final   Eosinophils Absolute 09/16/2022 0.1  0.0 - 0.5 K/uL Final   Basophils Relative 09/16/2022 1  % Final   Basophils Absolute 09/16/2022 0.0  0.0 - 0.1 K/uL Final   Immature Granulocytes 09/16/2022 0  % Final   Abs Immature Granulocytes 09/16/2022 0.01  0.00 - 0.07 K/uL Final   Performed at [facility], [address] [place], [place] [address]   Sodium 09/16/2022 140  135 - 145 mmol/L Final   Potassium 09/16/2022 3.4 (L)  3.5 - 5.1 mmol/L Final   Chloride 09/16/2022 102  98 - 111 mmol/L Final   CO2 09/16/2022 29  22 - 32 mmol/L Final   Glucose, Bld 09/16/2022 115 (H)  70  - 99 mg/dL Final   Glucose reference range applies only to samples taken after fasting for at least 8 hours.   BUN 09/16/2022 25 (H)  6 - 20 mg/dL Final   Creatinine, Ser 09/16/2022 0.90  0.44 - 1.00 mg/dL Final   Calcium 28/41/3244 9.0  8.9 - 10.3 mg/dL Final   Total Protein 01/29/7251 6.4 (L)  6.5 - 8.1 g/dL Final   Albumin 66/44/0347 4.1  3.5 - 5.0 g/dL Final   AST 42/59/5638 15  15 - 41 U/L Final   ALT 09/16/2022 15  0 - 44 U/L Final   Alkaline Phosphatase 09/16/2022 43  38 - 126 U/L Final   Total Bilirubin 09/16/2022 0.4  0.3 - 1.2 mg/dL Final   GFR, Estimated 09/16/2022 >60  >60 mL/min Final   Comment: (NOTE) Calculated using the CKD-EPI Creatinine Equation (2021)    Anion gap 09/16/2022 9  5 - 15 Final   Performed at [facility], [address], [place], [place] [address]   Opiates 09/16/2022 POSITIVE (A)  NONE DETECTED Final   Cocaine 09/16/2022 POSITIVE (A)  NONE DETECTED Final   Benzodiazepines 09/16/2022 POSITIVE (A)  NONE DETECTED Final   Amphetamines 09/16/2022 NONE DETECTED  NONE DETECTED Final   Tetrahydrocannabinol 09/16/2022 NONE DETECTED  NONE DETECTED Final   Barbiturates 09/16/2022 NONE DETECTED  NONE DETECTED Final   Comment: (NOTE) DRUG SCREEN FOR MEDICAL PURPOSES ONLY.  IF CONFIRMATION IS NEEDED FOR ANY PURPOSE, NOTIFY LAB WITHIN 5 DAYS.  LOWEST DETECTABLE LIMITS FOR URINE DRUG SCREEN Drug Class                     Cutoff (  ng/mL) Amphetamine and metabolites    1000 Barbiturate and metabolites    200 Benzodiazepine                 200 Opiates and metabolites        300 Cocaine and metabolites        300 THC                            50 Performed at [facility], [address] [place], [place] [address]    Preg, Serum 09/16/2022 NEGATIVE  NEGATIVE Final   Comment:        THE SENSITIVITY OF THIS METHODOLOGY IS >10 mIU/mL. Performed at [facility], [address], [place], [place] [address]   Admission on 09/12/2022, Discharged  on 09/13/2022  Component Date Value Ref Range Status   Sodium 09/12/2022 138  135 - 145 mmol/L Final   Potassium 09/12/2022 4.8  3.5 - 5.1 mmol/L Final   Chloride 09/12/2022 102  98 - 111 mmol/L Final   CO2 09/12/2022 27  22 - 32 mmol/L Final   Glucose, Bld 09/12/2022 93  70 - 99 mg/dL Final   Glucose reference range applies only to samples taken after fasting for at least 8 hours.   BUN 09/12/2022 13  6 - 20 mg/dL Final   Creatinine, Ser 09/12/2022 0.57  0.44 - 1.00 mg/dL Final   Calcium 69/62/9528 9.6  8.9 - 10.3 mg/dL Final   Total Protein 41/32/4401 7.6  6.5 - 8.1 g/dL Final   Albumin 02/72/5366 4.5  3.5 - 5.0 g/dL Final   AST 44/03/4740 24  15 - 41 U/L Final   ALT 09/12/2022 23  0 - 44 U/L Final   Alkaline Phosphatase 09/12/2022 58  38 - 126 U/L Final   Total Bilirubin 09/12/2022 0.5  0.3 - 1.2 mg/dL Final   GFR, Estimated 09/12/2022 >60  >60 mL/min Final   Comment: (NOTE) Calculated using the CKD-EPI Creatinine Equation (2021)    Anion gap 09/12/2022 9  5 - 15 Final   Performed at [facility], [address] [place], [place] [address]   Alcohol, Ethyl (B) 09/12/2022 <10  <10 mg/dL Final   Comment: (NOTE) Lowest detectable limit for serum alcohol is 10 mg/dL.  For medical purposes only. Performed at [facility], [address], [place], [place] [address]    WBC 09/12/2022 3.3 (L)  4.0 - 10.5 K/uL Final   RBC 09/12/2022 5.06  3.87 - 5.11 MIL/uL Final   Hemoglobin 09/12/2022 13.1  12.0 - 15.0 g/dL Final   HCT 33/29/5188 42.4  36.0 - 46.0 % Final   MCV 09/12/2022 83.8  80.0 - 100.0 fL Final   MCH 09/12/2022 25.9 (L)  26.0 - 34.0 pg Final   MCHC 09/12/2022 30.9  30.0 - 36.0 g/dL Final   RDW 41/66/0630 14.1  11.5 - 15.5 % Final   Platelets 09/12/2022 211  150 - 400 K/uL Final   nRBC 09/12/2022 0.0  0.0 - 0.2 % Final   Performed at [facility], [address], [place], [place] [address]   Opiates 09/12/2022 POSITIVE  (A)  NONE DETECTED Final   Cocaine 09/12/2022 POSITIVE (A)  NONE DETECTED Final   Benzodiazepines 09/12/2022 POSITIVE (A)  NONE DETECTED Final   Amphetamines 09/12/2022 NONE DETECTED  NONE DETECTED Final   Tetrahydrocannabinol 09/12/2022 NONE DETECTED  NONE DETECTED Final  Barbiturates 09/12/2022 NONE DETECTED  NONE DETECTED Final   Comment: (NOTE) DRUG SCREEN FOR MEDICAL PURPOSES ONLY.  IF CONFIRMATION IS NEEDED FOR ANY PURPOSE, NOTIFY LAB WITHIN 5 DAYS.  LOWEST DETECTABLE LIMITS FOR URINE DRUG SCREEN Drug Class                     Cutoff (ng/mL) Amphetamine and metabolites    1000 Barbiturate and metabolites    200 Benzodiazepine                 200 Opiates and metabolites        300 Cocaine and metabolites        300 THC                            50 Performed at [facility], [address], [place], [place] [address]    Preg, Serum 09/12/2022 NEGATIVE  NEGATIVE Final   Comment:        THE SENSITIVITY OF THIS METHODOLOGY IS >10 mIU/mL. Performed at [facility], [address] [place], [place] [address]    Specimen Source 09/12/2022 URINE, CLEAN CATCH   Final   Color, Urine 09/12/2022 YELLOW  YELLOW Final   APPearance 09/12/2022 CLEAR  CLEAR Final   Specific Gravity, Urine 09/12/2022 1.023  1.005 - 1.030 Final   pH 09/12/2022 6.0  5.0 - 8.0 Final   Glucose, UA 09/12/2022 NEGATIVE  NEGATIVE mg/dL Final   Hgb urine dipstick 09/12/2022 NEGATIVE  NEGATIVE Final   Bilirubin Urine 09/12/2022 NEGATIVE  NEGATIVE Final   Ketones, ur 09/12/2022 NEGATIVE  NEGATIVE mg/dL Final   Protein, ur 37/10/6267 NEGATIVE  NEGATIVE mg/dL Final   Nitrite 48/54/6270 NEGATIVE  NEGATIVE Final   Leukocytes,Ua 09/12/2022 NEGATIVE  NEGATIVE Final   RBC / HPF 09/12/2022 0-5  0 - 5 RBC/hpf Final   WBC, UA 09/12/2022 0-5  0 - 5 WBC/hpf Final   Comment:        Reflex urine culture not performed if WBC <=10, OR if Squamous epithelial cells >5. If Squamous  epithelial cells >5 suggest recollection.    Bacteria, UA 09/12/2022 NONE SEEN  NONE SEEN Final   Squamous Epithelial / HPF 09/12/2022 0-5  0 - 5 /HPF Final   Mucus 09/12/2022 PRESENT   Final   Performed at [facility], [address], [place], [place] [address]    Blood Alcohol level:  Lab Results  Component Value Date   ETH <10 10/09/2022   ETH <10 09/12/2022    Metabolic Disorder Labs: Lab Results  Component Value Date   HGBA1C 5.1 08/26/2020   MPG 99.67 08/26/2020   MPG 108 04/20/2010   Lab Results  Component Value Date   PROLACTIN 9.4 03/16/2012   Lab Results  Component Value Date   CHOL 163 04/11/2017   TRIG 63 04/14/2022   HDL 71 04/11/2017   CHOLHDL 2.3 04/11/2017   VLDL 11 04/11/2017   LDLCALC 81 04/11/2017   LDLCALC 139 (H) 12/10/2012    Therapeutic Lab Levels: No results found for: "LITHIUM" Lab Results  Component Value Date   VALPROATE 22.2 (L) 03/19/2012   No results found for: "CBMZ"  Physical Findings   AIMS    Flowsheet Row Ad[facility] (Discharged[facility]  AIMS Total Score 0      AUDIT    Flowsheet Row  Admission (Discharged) from [facility]  Alcohol Use Disorder Identification Test Final Score (AUDIT) 26      GAD-7    Flowsheet Row Routine [facility] Routine Prenatal from 07/11/2020 in [facility] Routine Prenatal from 06/05/2020 in [facility] Video Visit from 03/13/2020 in [facility] Initial Prenatal from 02/14/2020 in [facility]  Total GAD-7 Score 11 7 14  0 2      PHQ2-9    Flowsheet Row ED  from 11/19/2022 in [facility] ED from 10/08/2022 in [facility] Office Visit from 03/12/2021 in [facility] Office Visit from 02/12/2021 in [facility] Office Visit from 12/25/2020 in [facility]  PHQ-2 Total Score 3 6 3 1 5   PHQ-9 Total Score 13 15 7 6 14       Flowsheet Row ED from 11/19/2022 in [facility] ED from 11/18/2022 in [facility] ED from 10/08/2022 in [facility]  C-SSRS RISK CATEGORY High Risk No Risk No Risk      Musculoskeletal  Strength & Muscle Tone: within normal limits Gait & Station: normal Patient leans: N/A  Psychiatric Specialty Exam  Presentation General Appearance: Appropriate for Environment  Eye Contact:Fair  Speech:Clear and Coherent  Speech Volume:Normal  Handedness:-- (not assessed)   Mood and Affect  Mood:Euthymic  Affect:Congruent   Thought Process  Thought Processes:Coherent; Linear  Descriptions of Associations:Intact  Orientation:Full (Time, Place and Person)  Thought Content:Logical    Hallucinations:Hallucinations: None  Ideas of Reference:None  Suicidal Thoughts:Suicidal Thoughts: No  Homicidal Thoughts:Homicidal Thoughts: No   Sensorium  Memory:Immediate Fair; Recent Fair; Remote Fair  Judgment:Fair  Insight:Fair   Executive Functions  Concentration:Fair  Attention Span:Fair  Recall:Fair  Fund of Knowledge:Fair  Language:Fair   Psychomotor Activity  Psychomotor Activity:Psychomotor Activity: Normal   Assets  Assets:Communication Skills; Resilience   Sleep  Sleep:Sleep: Fair   Nutritional Assessment (For OBS and FBC admissions only) Has the patient had a weight loss or gain of 10 pounds or more in the last 3 months?: No Has the patient had a decrease in food intake/or appetite?:  Yes Does the patient have dental problems?: No Does the patient have eating habits or behaviors that may be indicators of an eating disorder including binging or inducing vomiting?: No Has the patient recently lost weight without trying?: 0 Has the patient been eating poorly because of a decreased appetite?: 0 Malnutrition Screening Tool Score: 0    Physical Exam Constitutional:      Appearance: the patient is not toxic-appearing.  Pulmonary:     Effort: Pulmonary effort is normal.  Neurological:     General: No focal deficit present.     Mental Status: the patient is alert and oriented to person, place, and time.   Review of Systems  Respiratory:  Negative for shortness of breath.   Cardiovascular:  Negative for chest pain.  Gastrointestinal:  Negative for abdominal pain, constipation, diarrhea, nausea and vomiting.  Neurological:  Negative for headaches.    BP 101/75   Pulse 76   Temp 98.1 F (36.7 C) (Oral)  Resp 18   SpO2 99%   Assessment and Plan:  Patient is under involuntary commitment.  Severe opioid use disorder, not currently in withdrawal - CIWA and COWS  Substance-induced mood disorder - Discontinue Trileptal, based on patient's refusal and perception of adverse effects - Start Cymbalta 30 mg daily  Oral infection - Augmentin for 7 days - Patient reports eating and drinking well  Disposition: Will arrange treatment follow-up on Monday  Carlyn Reichert, MD 11/23/2022 10:33 AM

## 2022-11-24 ENCOUNTER — Encounter (HOSPITAL_COMMUNITY): Payer: Self-pay

## 2022-11-24 ENCOUNTER — Other Ambulatory Visit: Payer: Self-pay

## 2022-11-24 ENCOUNTER — Emergency Department (HOSPITAL_COMMUNITY)
Admission: EM | Admit: 2022-11-24 | Discharge: 2022-11-25 | Discharge status: Against Medical Advice | Payer: Medicaid Other | Attending: Emergency Medicine | Admitting: Emergency Medicine

## 2022-11-24 DIAGNOSIS — R112 Nausea with vomiting, unspecified: Secondary | ICD-10-CM | POA: Insufficient documentation

## 2022-11-24 DIAGNOSIS — R451 Restlessness and agitation: Secondary | ICD-10-CM | POA: Insufficient documentation

## 2022-11-24 DIAGNOSIS — F1113 Opioid abuse with withdrawal: Secondary | ICD-10-CM | POA: Diagnosis present

## 2022-11-24 DIAGNOSIS — F1310 Sedative, hypnotic or anxiolytic abuse, uncomplicated: Secondary | ICD-10-CM | POA: Diagnosis not present

## 2022-11-24 DIAGNOSIS — Z5321 Procedure and treatment not carried out due to patient leaving prior to being seen by health care provider: Secondary | ICD-10-CM | POA: Diagnosis not present

## 2022-11-24 DIAGNOSIS — B998 Other infectious disease: Secondary | ICD-10-CM | POA: Diagnosis not present

## 2022-11-24 DIAGNOSIS — R109 Unspecified abdominal pain: Secondary | ICD-10-CM | POA: Diagnosis not present

## 2022-11-24 DIAGNOSIS — R509 Fever, unspecified: Secondary | ICD-10-CM | POA: Diagnosis not present

## 2022-11-24 DIAGNOSIS — F1994 Other psychoactive substance use, unspecified with psychoactive substance-induced mood disorder: Secondary | ICD-10-CM | POA: Diagnosis not present

## 2022-11-24 DIAGNOSIS — F1120 Opioid dependence, uncomplicated: Secondary | ICD-10-CM | POA: Diagnosis not present

## 2022-11-24 LAB — CBC WITH DIFFERENTIAL/PLATELET
Abs Immature Granulocytes: 0.02 10*3/uL (ref 0.00–0.07)
Basophils Absolute: 0 10*3/uL (ref 0.0–0.1)
Basophils Relative: 0 %
Eosinophils Absolute: 0 10*3/uL (ref 0.0–0.5)
Eosinophils Relative: 0 %
HCT: 38.6 % (ref 36.0–46.0)
Hemoglobin: 12.5 g/dL (ref 12.0–15.0)
Immature Granulocytes: 0 %
Lymphocytes Relative: 21 %
Lymphs Abs: 0.9 10*3/uL (ref 0.7–4.0)
MCH: 26.5 pg (ref 26.0–34.0)
MCHC: 32.4 g/dL (ref 30.0–36.0)
MCV: 81.8 fL (ref 80.0–100.0)
Monocytes Absolute: 0.2 10*3/uL (ref 0.1–1.0)
Monocytes Relative: 4 %
Neutro Abs: 3.4 10*3/uL (ref 1.7–7.7)
Neutrophils Relative %: 75 %
Platelets: 258 10*3/uL (ref 150–400)
RBC: 4.72 MIL/uL (ref 3.87–5.11)
RDW: 13.2 % (ref 11.5–15.5)
WBC: 4.5 10*3/uL (ref 4.0–10.5)
nRBC: 0 % (ref 0.0–0.2)

## 2022-11-24 LAB — COMPREHENSIVE METABOLIC PANEL
ALT: 80 U/L — ABNORMAL HIGH (ref 0–44)
AST: 44 U/L — ABNORMAL HIGH (ref 15–41)
Albumin: 4.5 g/dL (ref 3.5–5.0)
Alkaline Phosphatase: 102 U/L (ref 38–126)
Anion gap: 12 (ref 5–15)
BUN: 21 mg/dL — ABNORMAL HIGH (ref 6–20)
CO2: 23 mmol/L (ref 22–32)
Calcium: 9.6 mg/dL (ref 8.9–10.3)
Chloride: 104 mmol/L (ref 98–111)
Creatinine, Ser: 0.55 mg/dL (ref 0.44–1.00)
GFR, Estimated: >60 mL/min (ref >60)
Glucose, Bld: 111 mg/dL — ABNORMAL HIGH (ref 70–99)
Potassium: 3.7 mmol/L (ref 3.5–5.1)
Sodium: 139 mmol/L (ref 135–145)
Total Bilirubin: 1 mg/dL (ref 0.3–1.2)
Total Protein: 8 g/dL (ref 6.5–8.1)

## 2022-11-24 MED ORDER — BUPRENORPHINE HCL-NALOXONE HCL 8-2 MG SL SUBL
1.0000 | SUBLINGUAL_TABLET | Freq: Every day | SUBLINGUAL | Status: DC
  Administered 2022-11-24: 1 via SUBLINGUAL
  Filled 2022-11-24: qty 1

## 2022-11-24 MED ORDER — LORAZEPAM 2 MG/ML IJ SOLN
1.0000 mg | Freq: Once | INTRAMUSCULAR | Status: AC
  Administered 2022-11-24: 1 mg via INTRAMUSCULAR
  Filled 2022-11-24: qty 1

## 2022-11-24 MED ORDER — AMOXICILLIN-POT CLAVULANATE 875-125 MG PO TABS: 1.0000 | ORAL_TABLET | Freq: Two times a day (BID) | ORAL | 0 refills | Status: DC

## 2022-11-24 MED ORDER — LORAZEPAM 2 MG/ML IJ SOLN: 1.0000 mg | Freq: Once | INTRAMUSCULAR | Status: DC

## 2022-11-24 MED ORDER — VITAMIN B-1 100 MG PO TABS: 100.0000 mg | ORAL_TABLET | Freq: Every day | ORAL | Status: DC

## 2022-11-24 MED ORDER — DULOXETINE HCL 30 MG PO CPEP: 30.0000 mg | ORAL_CAPSULE | Freq: Every day | ORAL | 0 refills | Status: DC

## 2022-11-24 MED ORDER — HYDROXYZINE HCL 25 MG PO TABS: 25.0000 mg | ORAL_TABLET | Freq: Three times a day (TID) | ORAL | 0 refills | Status: DC

## 2022-11-24 NOTE — ED Notes (Signed)
[facility] called to transport pt. To [facility] stop. Will continue to monitor for safety and report any COC.

## 2022-11-24 NOTE — ED Notes (Signed)
Patient HR elevated. Provider reassessed and determined patient is stable to discharge. Awaiting cab arrival.

## 2022-11-24 NOTE — ED Notes (Signed)
Patient in the bedroom calm and composed. NAD. Respirations are even and unlabored. Will continue to monitor for safety.

## 2022-11-24 NOTE — ED Triage Notes (Signed)
Pt. Arrives POV with her mother for opioid withdrawal. Pt. Recently started taking her suboxone yesterday. She states that since she still had fentanyl in her system, she went straight into withdrawal. Today her symptoms have been a lot worse. She is experiencing N,V, abdominal pain, restlessness, fevers and chills.

## 2022-11-24 NOTE — ED Notes (Signed)
Patient c/o feeling worse, states her RLS is worse, she can't sit still. Patient also states she is sweating terribly. There is no diaphoresis observed. Patient has not experienced nausea, emesis, or diarrhea. Patient does not have observed tremors. The provider is aware and is awaiting a dual consult with Dr. Lucianne Muss.

## 2022-11-24 NOTE — ED Provider Notes (Incomplete)
Behavioral Health Progress Note  Date and Time: 11/24/2022 8:26 AM Name: [name] MRN:  [id number]  Subjective:   The patient is a 27 year old female with a past psychiatric history of severe opioid use disorder, previous history of overdose in March 2024 requiring intubation and subsequent psychiatric hospitalization.  The patient presented to the [facility] behavioral urgent care under IVC by her mother.  It appears that her mother was worried about out-of-control substance use problems.  The involuntary commitment was upheld on admission to the facility based crisis.  Patient was evaluated in her room. Reports her mood is "ok, I'm not depressed or anything". Rshe is reporting adequate sleep and appetite. Pt reports that since starting cymbalta, her mood has improved, she insists on increased mood instability while she was on trileptal. Denies any side effects.  She denies SI, HI, and AVH.   Pt shares she doesn't even remember getting checked into the [facility] She acknowledges she has benefitted form "being away from everybody and taking time for myself". She shares she has continued to have cravings, she is still interested in establishing care at [facility] for suboxone.   Pt reports regular BM. No toher somatic complaints. Has benefitted from augmentin for her tooth infection. Pt was educated on importance of completing her abx course.     Diagnosis:  Final diagnoses:  Substance induced mood disorder (HCC)  Opioid type dependence, continuous (HCC)  Mild benzodiazepine use disorder (HCC)    Total Time spent with patient: 20 minutes  Past Psychiatric History: as above Past Medical History: as above Family History: none Family Psychiatric  History: none Social History: as above and per H and P  Additional Social History:  See H and P                  Sleep: Fair  Appetite:  Fair   Current Medications:  Current Facility-Administered Medications  Medication Dose  Route Frequency Provider Last Rate Last Admin   alum & mag hydroxide-simeth (MAALOX/MYLANTA) 200-200-20 MG/5ML suspension 30 mL  30 mL Oral Q4H PRN White, Patrice L, NP       amoxicillin-clavulanate (AUGMENTIN) 875-125 MG per tablet 1 tablet  1 tablet Oral Q12H Carrion-Carrero, Carold Eisner, MD   1 tablet at 11/23/22 2126   benzocaine (ORAJEL) 10 % mucosal gel 1 Application  1 Application Mouth/Throat QID PRN Nelly Rout, MD       dicyclomine (BENTYL) tablet 20 mg  20 mg Oral Q6H PRN White, Patrice L, NP       diphenhydrAMINE (BENADRYL) injection 50 mg  50 mg Intramuscular Q12H PRN White, Patrice L, NP       DULoxetine (CYMBALTA) DR capsule 30 mg  30 mg Oral Daily Carlyn Reichert, MD   30 mg at 11/23/22 1045   hydrOXYzine (ATARAX) tablet 25 mg  25 mg Oral TID PRN White, Patrice L, NP   25 mg at 11/24/22 0430   ibuprofen (ADVIL) tablet 600 mg  600 mg Oral Q6H PRN Nelly Rout, MD   600 mg at 11/23/22 2021   loperamide (IMODIUM) capsule 2-4 mg  2-4 mg Oral PRN White, Patrice L, NP       OLANZapine zydis (ZYPREXA) disintegrating tablet 10 mg  10 mg Oral Q8H PRN Carrion-Carrero, Nance Mccombs, MD   10 mg at 11/23/22 2020   And   LORazepam (ATIVAN) tablet 1 mg  1 mg Oral PRN Carrion-Carrero, Karle Starch, MD       And   ziprasidone (GEODON) injection  20 mg  20 mg Intramuscular PRN Carrion-Carrero, Alee Gressman, MD       magnesium hydroxide (MILK OF MAGNESIA) suspension 30 mL  30 mL Oral Daily PRN White, Patrice L, NP       methocarbamol (ROBAXIN) tablet 500 mg  500 mg Oral Q8H PRN White, Patrice L, NP   500 mg at 11/24/22 0743   multivitamin with minerals tablet 1 tablet  1 tablet Oral Daily White, Patrice L, NP   1 tablet at 11/23/22 0920   ondansetron (ZOFRAN-ODT) disintegrating tablet 4 mg  4 mg Oral Q6H PRN White, Patrice L, NP       thiamine (VITAMIN B1) tablet 100 mg  100 mg Oral Daily White, Patrice L, NP   100 mg at 11/23/22 0920   traZODone (DESYREL) tablet 50 mg  50 mg Oral QHS PRN White, Patrice L, NP   50  mg at 11/23/22 2020   No current outpatient medications on file.    Labs  Lab Results:  Admission on 11/18/2022, Discharged on 11/19/2022  Component Date Value Ref Range Status   WBC 11/18/2022 3.9 (L)  4.0 - 10.5 K/uL Final   RBC 11/18/2022 4.58  3.87 - 5.11 MIL/uL Final   Hemoglobin 11/18/2022 11.8 (L)  12.0 - 15.0 g/dL Final   HCT 41/32/4401 37.7  36.0 - 46.0 % Final   MCV 11/18/2022 82.3  80.0 - 100.0 fL Final   MCH 11/18/2022 25.8 (L)  26.0 - 34.0 pg Final   MCHC 11/18/2022 31.3  30.0 - 36.0 g/dL Final   RDW 02/72/5366 14.0  11.5 - 15.5 % Final   Platelets 11/18/2022 173  150 - 400 K/uL Final   nRBC 11/18/2022 0.0  0.0 - 0.2 % Final   Neutrophils Relative % 11/18/2022 40  % Final   Neutro Abs 11/18/2022 1.5 (L)  1.7 - 7.7 K/uL Final   Lymphocytes Relative 11/18/2022 42  % Final   Lymphs Abs 11/18/2022 1.6  0.7 - 4.0 K/uL Final   Monocytes Relative 11/18/2022 7  % Final   Monocytes Absolute 11/18/2022 0.3  0.1 - 1.0 K/uL Final   Eosinophils Relative 11/18/2022 10  % Final   Eosinophils Absolute 11/18/2022 0.4  0.0 - 0.5 K/uL Final   Basophils Relative 11/18/2022 1  % Final   Basophils Absolute 11/18/2022 0.0  0.0 - 0.1 K/uL Final   Immature Granulocytes 11/18/2022 0  % Final   Abs Immature Granulocytes 11/18/2022 0.01  0.00 - 0.07 K/uL Final   Performed at [facility], [address] [place], [place] [address]   Sodium 11/18/2022 142  135 - 145 mmol/L Final   Potassium 11/18/2022 4.0  3.5 - 5.1 mmol/L Final   Chloride 11/18/2022 107  98 - 111 mmol/L Final   CO2 11/18/2022 26  22 - 32 mmol/L Final   Glucose, Bld 11/18/2022 74  70 - 99 mg/dL Final   Glucose reference range applies only to samples taken after fasting for at least 8 hours.   BUN 11/18/2022 10  6 - 20 mg/dL Final   Creatinine, Ser 11/18/2022 0.64  0.44 - 1.00 mg/dL Final   Calcium 74/25/9563 8.6 (L)  8.9 - 10.3 mg/dL Final   Total Protein 87/56/4332 5.7 (L)  6.5 - 8.1 g/dL Final   Albumin 95/18/8416  3.6  3.5 - 5.0 g/dL Final   AST 60/63/0160 82 (H)  15 - 41 U/L Final   ALT 11/18/2022 57 (H)  0 - 44 U/L Final  Alkaline Phosphatase 11/18/2022 69  38 - 126 U/L Final   Total Bilirubin 11/18/2022 0.4  0.3 - 1.2 mg/dL Final   GFR, Estimated 11/18/2022 >60  >60 mL/min Final   Comment: (NOTE) Calculated using the CKD-EPI Creatinine Equation (2021)    Anion gap 11/18/2022 9  5 - 15 Final   Performed at [facility], [address], [place], [place] [address]   Preg Test, Ur 11/18/2022 Negative  Negative Final   POC Amphetamine UR 11/18/2022 None Detected  NONE DETECTED (Cut Off Level 1000 ng/mL) Final   POC Secobarbital (BAR) 11/18/2022 None Detected  NONE DETECTED (Cut Off Level 300 ng/mL) Final   POC Buprenorphine (BUP) 11/18/2022 None Detected  NONE DETECTED (Cut Off Level 10 ng/mL) Final   POC Oxazepam (BZO) 11/18/2022 Positive (A)  NONE DETECTED (Cut Off Level 300 ng/mL) Final   POC Cocaine UR 11/18/2022 None Detected  NONE DETECTED (Cut Off Level 300 ng/mL) Final   POC Methamphetamine UR 11/18/2022 None Detected  NONE DETECTED (Cut Off Level 1000 ng/mL) Final   POC Morphine 11/18/2022 None Detected  NONE DETECTED (Cut Off Level 300 ng/mL) Final   POC Methadone UR 11/18/2022 None Detected  NONE DETECTED (Cut Off Level 300 ng/mL) Final   POC Oxycodone UR 11/18/2022 None Detected  NONE DETECTED (Cut Off Level 100 ng/mL) Final   POC Marijuana UR 11/18/2022 None Detected  NONE DETECTED (Cut Off Level 50 ng/mL) Final  Admission on 10/08/2022, Discharged on 10/10/2022  Component Date Value Ref Range Status   WBC 10/09/2022 3.6 (L)  4.0 - 10.5 K/uL Final   RBC 10/09/2022 4.83  3.87 - 5.11 MIL/uL Final   Hemoglobin 10/09/2022 12.4  12.0 - 15.0 g/dL Final   HCT 19/14/7829 39.2  36.0 - 46.0 % Final   MCV 10/09/2022 81.2  80.0 - 100.0 fL Final   MCH 10/09/2022 25.7 (L)  26.0 - 34.0 pg Final   MCHC 10/09/2022 31.6  30.0 - 36.0 g/dL Final   RDW 56/21/3086 14.3  11.5 - 15.5 % Final    Platelets 10/09/2022 190  150 - 400 K/uL Final   nRBC 10/09/2022 0.0  0.0 - 0.2 % Final   Neutrophils Relative % 10/09/2022 43  % Final   Neutro Abs 10/09/2022 1.5 (L)  1.7 - 7.7 K/uL Final   Lymphocytes Relative 10/09/2022 45  % Final   Lymphs Abs 10/09/2022 1.6  0.7 - 4.0 K/uL Final   Monocytes Relative 10/09/2022 7  % Final   Monocytes Absolute 10/09/2022 0.2  0.1 - 1.0 K/uL Final   Eosinophils Relative 10/09/2022 4  % Final   Eosinophils Absolute 10/09/2022 0.1  0.0 - 0.5 K/uL Final   Basophils Relative 10/09/2022 1  % Final   Basophils Absolute 10/09/2022 0.0  0.0 - 0.1 K/uL Final   Immature Granulocytes 10/09/2022 0  % Final   Abs Immature Granulocytes 10/09/2022 0.00  0.00 - 0.07 K/uL Final   Performed at [facility], [address], [place], [place] [address]   Sodium 10/09/2022 141  135 - 145 mmol/L Final   Potassium 10/09/2022 4.6  3.5 - 5.1 mmol/L Final   Chloride 10/09/2022 101  98 - 111 mmol/L Final   CO2 10/09/2022 27  22 - 32 mmol/L Final   Glucose, Bld 10/09/2022 80  70 - 99 mg/dL Final   Glucose reference range applies only to samples taken after fasting for at least 8 hours.   BUN 10/09/2022 8  6 -  20 mg/dL Final   Creatinine, Ser 10/09/2022 0.70  0.44 - 1.00 mg/dL Final   Calcium 56/38/7564 9.3  8.9 - 10.3 mg/dL Final   Total Protein 33/29/5188 6.0 (L)  6.5 - 8.1 g/dL Final   Albumin 41/66/0630 4.0  3.5 - 5.0 g/dL Final   AST 16/01/930 16  15 - 41 U/L Final   ALT 10/09/2022 14  0 - 44 U/L Final   Alkaline Phosphatase 10/09/2022 48  38 - 126 U/L Final   Total Bilirubin 10/09/2022 0.8  0.3 - 1.2 mg/dL Final   GFR, Estimated 10/09/2022 >60  >60 mL/min Final   Comment: (NOTE) Calculated using the CKD-EPI Creatinine Equation (2021)    Anion gap 10/09/2022 13  5 - 15 Final   Performed at [facility], [address] [place], [place] [address]   Alcohol, Ethyl (B) 10/09/2022 <10  <10 mg/dL Final   Comment: (NOTE) Lowest detectable limit for serum  alcohol is 10 mg/dL.  For medical purposes only. Performed at [facility], [address] [place], [place] [address]    TSH 10/09/2022 0.553  0.350 - 4.500 uIU/mL Final   Comment: Performed by a 3rd Generation assay with a functional sensitivity of <=0.01 uIU/mL. Performed at [facility], [address], [place], [place] [address]    Preg Test, Ur 10/10/2022 Negative  Negative Final   POC Amphetamine UR 10/10/2022 None Detected  NONE DETECTED (Cut Off Level 1000 ng/mL) Final   POC Secobarbital (BAR) 10/10/2022 None Detected  NONE DETECTED (Cut Off Level 300 ng/mL) Final   POC Buprenorphine (BUP) 10/10/2022 None Detected  NONE DETECTED (Cut Off Level 10 ng/mL) Final   POC Oxazepam (BZO) 10/10/2022 None Detected  NONE DETECTED (Cut Off Level 300 ng/mL) Final   POC Cocaine UR 10/10/2022 Positive (A)  NONE DETECTED (Cut Off Level 300 ng/mL) Final   POC Methamphetamine UR 10/10/2022 None Detected  NONE DETECTED (Cut Off Level 1000 ng/mL) Final   POC Morphine 10/10/2022 None Detected  NONE DETECTED (Cut Off Level 300 ng/mL) Final   POC Methadone UR 10/10/2022 None Detected  NONE DETECTED (Cut Off Level 300 ng/mL) Final   POC Oxycodone UR 10/10/2022 None Detected  NONE DETECTED (Cut Off Level 100 ng/mL) Final   POC Marijuana UR 10/10/2022 Positive (A)  NONE DETECTED (Cut Off Level 50 ng/mL) Final   HIV Screen 4th Generation wRfx 10/09/2022 Non Reactive  Non Reactive Final   Performed at [facility], [address], [place], [place] [address]   Hepatitis B Surface Ag 10/09/2022 NON REACTIVE  NON REACTIVE Final   HCV Ab 10/09/2022 NON REACTIVE  NON REACTIVE Final   Comment: (NOTE) Nonreactive HCV antibody screen is consistent with no HCV infections,  unless recent infection is suspected or other evidence exists to indicate HCV infection.     Hep A IgM 10/09/2022 NON REACTIVE  NON REACTIVE Final   Hep B C IgM 10/09/2022 NON REACTIVE  NON REACTIVE Final   Performed at  [facility], [address] [place], [place] [address]   RPR Ser Ql 10/09/2022 NON REACTIVE  NON REACTIVE Final   Performed at [facility], [address], [place], [place] [address]  Admission on 09/16/2022, Discharged on 09/17/2022  Component Date Value Ref Range Status   WBC 09/16/2022 4.8  4.0 - 10.5 K/uL Final   RBC 09/16/2022 4.76  3.87 - 5.11 MIL/uL Final   Hemoglobin 09/16/2022 12.0  12.0 - 15.0 g/dL Final   HCT 19/14/7829 39.3  36.0 - 46.0 % Final   MCV 09/16/2022 82.6  80.0 - 100.0 fL Final   MCH 09/16/2022 25.2 (L)  26.0 - 34.0 pg Final   MCHC 09/16/2022 30.5  30.0 - 36.0 g/dL Final   RDW 56/21/3086 14.3  11.5 - 15.5 % Final   Platelets 09/16/2022 222  150 - 400 K/uL Final   nRBC 09/16/2022 0.0  0.0 - 0.2 % Final   Neutrophils Relative % 09/16/2022 47  % Final   Neutro Abs 09/16/2022 2.2  1.7 - 7.7 K/uL Final   Lymphocytes Relative 09/16/2022 44  % Final   Lymphs Abs 09/16/2022 2.1  0.7 - 4.0 K/uL Final   Monocytes Relative 09/16/2022 6  % Final   Monocytes Absolute 09/16/2022 0.3  0.1 - 1.0 K/uL Final   Eosinophils Relative 09/16/2022 2  % Final   Eosinophils Absolute 09/16/2022 0.1  0.0 - 0.5 K/uL Final   Basophils Relative 09/16/2022 1  % Final   Basophils Absolute 09/16/2022 0.0  0.0 - 0.1 K/uL Final   Immature Granulocytes 09/16/2022 0  % Final   Abs Immature Granulocytes 09/16/2022 0.01  0.00 - 0.07 K/uL Final   Performed at [facility], [address], [place], [place] [address]   Sodium 09/16/2022 140  135 - 145 mmol/L Final   Potassium 09/16/2022 3.4 (L)  3.5 - 5.1 mmol/L Final   Chloride 09/16/2022 102  98 - 111 mmol/L Final   CO2 09/16/2022 29  22 - 32 mmol/L Final   Glucose, Bld 09/16/2022 115 (H)  70 - 99 mg/dL Final   Glucose reference range applies only to samples taken after fasting for at least 8 hours.   BUN 09/16/2022 25 (H)  6 - 20 mg/dL Final   Creatinine, Ser 09/16/2022 0.90  0.44 - 1.00 mg/dL Final   Calcium 96/29/5284 9.0   8.9 - 10.3 mg/dL Final   Total Protein 13/24/4010 6.4 (L)  6.5 - 8.1 g/dL Final   Albumin 27/25/3664 4.1  3.5 - 5.0 g/dL Final   AST 40/34/7425 15  15 - 41 U/L Final   ALT 09/16/2022 15  0 - 44 U/L Final   Alkaline Phosphatase 09/16/2022 43  38 - 126 U/L Final   Total Bilirubin 09/16/2022 0.4  0.3 - 1.2 mg/dL Final   GFR, Estimated 09/16/2022 >60  >60 mL/min Final   Comment: (NOTE) Calculated using the CKD-EPI Creatinine Equation (2021)    Anion gap 09/16/2022 9  5 - 15 Final   Performed at [facility], [address], [place], [place] [address]   Opiates 09/16/2022 POSITIVE (A)  NONE DETECTED Final   Cocaine 09/16/2022 POSITIVE (A)  NONE DETECTED Final   Benzodiazepines 09/16/2022 POSITIVE (A)  NONE DETECTED Final   Amphetamines 09/16/2022 NONE DETECTED  NONE DETECTED Final   Tetrahydrocannabinol 09/16/2022 NONE DETECTED  NONE DETECTED Final   Barbiturates 09/16/2022 NONE DETECTED  NONE DETECTED Final   Comment: (NOTE) DRUG SCREEN FOR MEDICAL PURPOSES ONLY.  IF CONFIRMATION IS NEEDED FOR ANY PURPOSE, NOTIFY LAB WITHIN 5 DAYS.  LOWEST DETECTABLE LIMITS FOR URINE DRUG SCREEN Drug Class                     Cutoff (ng/mL) Amphetamine and metabolites    1000 Barbiturate and metabolites    200 Benzodiazepine  200 Opiates and metabolites        300 Cocaine and metabolites        300 THC                            50 Performed at [facility], [address] [place], [place] [address]    Preg, Serum 09/16/2022 NEGATIVE  NEGATIVE Final   Comment:        THE SENSITIVITY OF THIS METHODOLOGY IS >10 mIU/mL. Performed at [facility], [address] [place], [place] [address]   Admission on 09/12/2022, Discharged on 09/13/2022  Component Date Value Ref Range Status   Sodium 09/12/2022 138  135 - 145 mmol/L Final   Potassium 09/12/2022 4.8  3.5 - 5.1 mmol/L Final   Chloride 09/12/2022 102  98 - 111 mmol/L Final   CO2 09/12/2022 27  22 - 32 mmol/L  Final   Glucose, Bld 09/12/2022 93  70 - 99 mg/dL Final   Glucose reference range applies only to samples taken after fasting for at least 8 hours.   BUN 09/12/2022 13  6 - 20 mg/dL Final   Creatinine, Ser 09/12/2022 0.57  0.44 - 1.00 mg/dL Final   Calcium 09/81/1914 9.6  8.9 - 10.3 mg/dL Final   Total Protein 78/29/5621 7.6  6.5 - 8.1 g/dL Final   Albumin 30/86/5784 4.5  3.5 - 5.0 g/dL Final   AST 69/62/9528 24  15 - 41 U/L Final   ALT 09/12/2022 23  0 - 44 U/L Final   Alkaline Phosphatase 09/12/2022 58  38 - 126 U/L Final   Total Bilirubin 09/12/2022 0.5  0.3 - 1.2 mg/dL Final   GFR, Estimated 09/12/2022 >60  >60 mL/min Final   Comment: (NOTE) Calculated using the CKD-EPI Creatinine Equation (2021)    Anion gap 09/12/2022 9  5 - 15 Final   Performed at [facility], [address] [place], [place] [address]   Alcohol, Ethyl (B) 09/12/2022 <10  <10 mg/dL Final   Comment: (NOTE) Lowest detectable limit for serum alcohol is 10 mg/dL.  For medical purposes only. Performed at [facility], [address] [place], [place] [address]    WBC 09/12/2022 3.3 (L)  4.0 - 10.5 K/uL Final   RBC 09/12/2022 5.06  3.87 - 5.11 MIL/uL Final   Hemoglobin 09/12/2022 13.1  12.0 - 15.0 g/dL Final   HCT 72/53/6644 42.4  36.0 - 46.0 % Final   MCV 09/12/2022 83.8  80.0 - 100.0 fL Final   MCH 09/12/2022 25.9 (L)  26.0 - 34.0 pg Final   MCHC 09/12/2022 30.9  30.0 - 36.0 g/dL Final   RDW 03/47/4259 14.1  11.5 - 15.5 % Final   Platelets 09/12/2022 211  150 - 400 K/uL Final   nRBC 09/12/2022 0.0  0.0 - 0.2 % Final   Performed at [facility], [address] [place], [place] [address]   Opiates 09/12/2022 POSITIVE (A)  NONE DETECTED Final   Cocaine 09/12/2022 POSITIVE (A)  NONE DETECTED Final   Benzodiazepines 09/12/2022 POSITIVE (A)  NONE DETECTED Final   Amphetamines 09/12/2022 NONE DETECTED  NONE DETECTED Final   Tetrahydrocannabinol 09/12/2022  NONE DETECTED  NONE DETECTED Final   Barbiturates 09/12/2022 NONE DETECTED  NONE DETECTED Final   Comment: (NOTE) DRUG SCREEN FOR MEDICAL PURPOSES ONLY.  IF CONFIRMATION IS NEEDED FOR ANY PURPOSE, NOTIFY LAB WITHIN 5 DAYS.  LOWEST DETECTABLE LIMITS FOR URINE DRUG SCREEN Drug Class                     Cutoff (ng/mL) Amphetamine and metabolites    1000 Barbiturate and metabolites    200 Benzodiazepine                 200 Opiates and metabolites        300 Cocaine and metabolites        300 THC                            50 Performed at [facility], [address] [place], [place] [address]    Preg, Serum 09/12/2022 NEGATIVE  NEGATIVE Final   Comment:        THE SENSITIVITY OF THIS METHODOLOGY IS >10 mIU/mL. Performed at [facility], [address] [place], [place] [address]    Specimen Source 09/12/2022 URINE, CLEAN CATCH   Final   Color, Urine 09/12/2022 YELLOW  YELLOW Final   APPearance 09/12/2022 CLEAR  CLEAR Final   Specific Gravity, Urine 09/12/2022 1.023  1.005 - 1.030 Final   pH 09/12/2022 6.0  5.0 - 8.0 Final   Glucose, UA 09/12/2022 NEGATIVE  NEGATIVE mg/dL Final   Hgb urine dipstick 09/12/2022 NEGATIVE  NEGATIVE Final   Bilirubin Urine 09/12/2022 NEGATIVE  NEGATIVE Final   Ketones, ur 09/12/2022 NEGATIVE  NEGATIVE mg/dL Final   Protein, ur 09/81/1914 NEGATIVE  NEGATIVE mg/dL Final   Nitrite 78/29/5621 NEGATIVE  NEGATIVE Final   Leukocytes,Ua 09/12/2022 NEGATIVE  NEGATIVE Final   RBC / HPF 09/12/2022 0-5  0 - 5 RBC/hpf Final   WBC, UA 09/12/2022 0-5  0 - 5 WBC/hpf Final   Comment:        Reflex urine culture not performed if WBC <=10, OR if Squamous epithelial cells >5. If Squamous epithelial cells >5 suggest recollection.    Bacteria, UA 09/12/2022 NONE SEEN  NONE SEEN Final   Squamous Epithelial / HPF 09/12/2022 0-5  0 - 5 /HPF Final   Mucus 09/12/2022 PRESENT   Final   Performed at [facility],  [address] [place], [place] [address]    Blood Alcohol level:  Lab Results  Component Value Date   ETH <10 10/09/2022   ETH <10 09/12/2022    Metabolic Disorder Labs: Lab Results  Component Value Date   HGBA1C 5.1 08/26/2020   MPG 99.67 08/26/2020   MPG 108 04/20/2010   Lab Results  Component Value Date   PROLACTIN 9.4 03/16/2012   Lab Results  Component Value Date   CHOL 163 04/11/2017   TRIG 63 04/14/2022   HDL 71 04/11/2017   CHOLHDL 2.3 04/11/2017   VLDL 11 04/11/2017   LDLCALC 81 04/11/2017   LDLCALC 139 (H) 12/10/2012    Therapeutic Lab Levels: No results found for: "LITHIUM" Lab Results  Component Value Date   VALPROATE 22.2 (L) 03/19/2012   No results found for: "CBMZ"  Physical Findings   AIMS    Flowsheet Row Ad[facility] (Discharged[facility]  AIMS Total Score 0      AUDIT    Flowsheet Row Admission (Discharged) from [facility]  Alcohol Use Disorder Identification Test Final Score (AUDIT) 26      GAD-7  Flowsheet Row Routine [facility] Routine Prenatal from 07[facility]11/[facility] Routine Prenatal from 06/05/2020 in [facility] Video Visit from 03/13/2020 [facility] Initial Prenatal from 02/14/2020 in [facility]  Total GAD-7 Score 11 7 14  0 2      PHQ2-9    Flowsheet Row ED from 11/19/2022 in [facility] ED from 10/08/2022 in [facility] Office Visit from 03/12/2021 in [facility] Office Visit from 02/12/2021 in [facility] Office Visit from 12/25/2020 in [facility]  PHQ-2 Total Score 3 6 3 1 5   PHQ-9 Total Score 13 15 7 6 14       Flowsheet Row ED from 11/19/2022 in [facility] ED from 11/18/2022 in [facility] ED from 10/08/2022 in [facility]  C-SSRS RISK CATEGORY High Risk No Risk No Risk      Musculoskeletal  Strength & Muscle Tone: within normal limits Gait & Station: normal Patient leans: N/A  Psychiatric Specialty Exam  Presentation General Appearance: Appropriate for Environment  Eye Contact:Fair  Speech:Clear and Coherent  Speech Volume:Normal  Handedness:-- (not assessed)   Mood and Affect  Mood:Euthymic  Affect:Congruent   Thought Process  Thought Processes:Coherent; Linear  Descriptions of Associations:Intact  Orientation:Full (Time, Place and Person)  Thought Content:Logical    Hallucinations:Hallucinations: None  Ideas of Reference:None  Suicidal Thoughts:Suicidal Thoughts: No  Homicidal Thoughts:Homicidal Thoughts: No   Sensorium  Memory:Immediate Fair; Recent Fair; Remote Fair  Judgment:Fair  Insight:Fair   Executive Functions  Concentration:Fair  Attention Span:Fair  Recall:Fair  Fund of Knowledge:Fair  Language:Fair   Psychomotor Activity  Psychomotor Activity:Psychomotor Activity: Normal   Assets  Assets:Communication Skills; Resilience   Sleep  Sleep:Sleep: Fair   Nutritional Assessment (For OBS and FBC admissions only) Has the patient had a weight loss or gain of 10 pounds or more in the last 3 months?: No Has the patient had a decrease in food intake/or appetite?: Yes Does the patient have dental problems?: No Does the patient have eating habits or behaviors that may be indicators of an eating disorder including binging or inducing vomiting?: No Has the patient recently lost weight without trying?:  0 Has the patient been eating poorly because of a decreased appetite?: 0 Malnutrition Screening Tool Score: 0    Physical Exam Constitutional:      Appearance: the patient is not toxic-appearing.  Pulmonary:     Effort: Pulmonary effort is normal.  Neurological:     General: No focal deficit present.     Mental Status: the patient is alert and oriented to person, place, and time.   Review of Systems  Respiratory:  Negative for shortness of breath.   Cardiovascular:  Negative for chest pain.  Gastrointestinal:  Negative for abdominal pain, constipation, diarrhea, nausea and vomiting.  Neurological:  Negative for headaches.    BP 109/66   Pulse (!) 105   Temp 98.7 F (37.1 C) (Oral)   Resp 18   SpO2 99%   Assessment and Plan:  Patient is under involuntary commitment.  Severe opioid use disorder, not currently in withdrawal - CIWA and COWS  Substance-induced  mood disorder - Discontinue Trileptal, based on patient's refusal and perception of adverse effects - Start Cymbalta 30 mg daily  Oral infection - Augmentin for 7 days - Patient reports eating and drinking well  Disposition: Will arrange treatment follow-up on Monday   Signed: Lorri Frederick, MD 11/24/2022 8:26 AM

## 2022-11-24 NOTE — ED Notes (Signed)
Patient A&O x 4, calm, cooperative with a little irritability r/t c/o RLS. Robaxin administered per order.Patiet has a depressed affect but states she feels a little better today emotionally. Patient  denies SI, HI, AVH.

## 2022-11-24 NOTE — ED Notes (Signed)
Patient in the bedroom sleeping, comfortable. NAD. Respirations are even and unlabored. Will continue to monitor for safety.

## 2022-11-24 NOTE — ED Notes (Signed)
Patient d/c in cab to [facility] for intake process if followed through. Patient in stable condition. D/c instructions reviewed. Patient expressed understanding. All belongings accompanied the patient. Patient denies SI, HI, AVH. Patient w/o indication of responses to internal stimuli.

## 2022-11-24 NOTE — ED Notes (Addendum)
Patient informed by providers that she was going to receive a hydroxyzine PO. Patient then stated she much preferred the shot. Patient stated she felt like she was going to jump out of her skin, inability to remain still. Prior to encounter, patient had taken a shower, was not observed with diaphoresis, severe anxiousness, restlessness, emesis, pain, or tremors. New order for Ativan 1 mg IM placed, awaiting pharmacy review.

## 2022-11-24 NOTE — Discharge Planning (Addendum)
Treatment team spoke regarding disposition plans. Patient is still adamant about not going to residential placement, however was in agreement with MAT being initiated while here. Patient was started on MAT this morning and reports not feeling too well. LCSW was informed by MD that patient would like to be established with [facility] once she is ready for discharge. LCSW contacted Kathlene November from [place] to inform of patient's request for MAT follow up. Per Kathlene November, patient can be seen on today by their agency if she can arrive by 3:00pm. Address to report is: [address] [facility] [address] [place] [address]. Number to call as needed is: [phone]. Agency is currently relocating to another building starting tomorrow. Updated address will be provided to the patient at appt. Patient made aware of plan and MD in agreement. No other needs to report at this time. LCSW to sign off. Please inform if further LCSW needs arise prior to discharge.   Fernande Boyden, LCSW Clinical Social Worker [facility] Ph: [phone]

## 2022-11-25 ENCOUNTER — Encounter (HOSPITAL_COMMUNITY): Payer: Self-pay

## 2022-11-25 ENCOUNTER — Other Ambulatory Visit: Payer: Self-pay

## 2022-11-25 ENCOUNTER — Emergency Department (HOSPITAL_COMMUNITY)
Admission: EM | Admit: 2022-11-25 | Discharge: 2022-11-26 | Discharge status: Against Medical Advice | Payer: Medicaid Other | Attending: Emergency Medicine | Admitting: Emergency Medicine

## 2022-11-25 DIAGNOSIS — R1110 Vomiting, unspecified: Secondary | ICD-10-CM | POA: Diagnosis not present

## 2022-11-25 DIAGNOSIS — Z5321 Procedure and treatment not carried out due to patient leaving prior to being seen by health care provider: Secondary | ICD-10-CM | POA: Diagnosis not present

## 2022-11-25 DIAGNOSIS — R109 Unspecified abdominal pain: Secondary | ICD-10-CM | POA: Insufficient documentation

## 2022-11-25 LAB — CBC
HCT: 40.7 % (ref 36.0–46.0)
Hemoglobin: 13.2 g/dL (ref 12.0–15.0)
MCH: 26.8 pg (ref 26.0–34.0)
MCHC: 32.4 g/dL (ref 30.0–36.0)
MCV: 82.6 fL (ref 80.0–100.0)
Platelets: 276 10*3/uL (ref 150–400)
RBC: 4.93 MIL/uL (ref 3.87–5.11)
RDW: 13.4 % (ref 11.5–15.5)
WBC: 5.4 10*3/uL (ref 4.0–10.5)
nRBC: 0 % (ref 0.0–0.2)

## 2022-11-25 NOTE — ED Provider Notes (Cosign Needed Addendum)
FBC/OBS ASAP Discharge Summary  Date and Time: 11/25/2022 8:51 AM  Name: [name]  MRN:  [id number]   Discharge Diagnoses:  Final diagnoses:  Substance induced mood disorder (HCC)  Opioid type dependence, continuous (HCC)  Mild benzodiazepine use disorder (HCC)    Subjective:  Patient was evaluated in her room. Reports her mood is "ok, I'm not depressed or anything". She is reporting adequate sleep and appetite. Pt reports that since starting cymbalta, her mood has improved, she insists on increased mood instability while she was on trileptal. Denies any side effects.  She denies SI, HI, and AVH.   Pt shares she doesn't even remember getting checked into the [facility]. She acknowledges she has benefitted form "being away from everybody and taking time for myself". She shares she has continued to have cravings, she is still interested in establishing care at [facility] for suboxone. At time of discharge, pt agrees to be discharged to [facility] for follow up of suboxone.   Pt reports regular BM. No toher somatic complaints. Has benefitted from augmentin for her tooth infection. Pt was educated on importance of completing her abx course.   Pt's mother, [name], was contacted at [phone], at patient's request to coordinate transportation to [facility]. Pt's mother agreed to pick up patient at [facility] and for patient to return home with her.    Stay Summary:  [name] is a 27 yo female with an extensive hx of opioid use disorder, presenting to [facility] under IVC, initially petition by mother, for rehab from heroin abuse.  UDS on admission is only positive for benzodiazepines.   During the patient's hospitalization at the [facility], patient had extensive initial psychiatric evaluation, with daily follow-up assessments focused on detoxification management.  Psychiatric diagnoses provided upon initial assessment:  Substance-induced mood disorder Opioid use disorder Benzodiazepine use disorder  Patient's  medications adjusted during hospitalization:  #Benzodiazepine use disorder -Ativan taper, started 10/23 -CIWA with Ativan as needed for CIWA greater than 10 PRNs -Thiamine 100 mg IM first day and PO after that -Multivitamin with minerals daily -Tylenol 650 mg every 6 hours as needed for pain -Zofran 4 mg every 6 hours as needed for nausea or vomiting -Imodium 2 to 4 mg as needed for diarrhea or loose stools  -Maalox/Mylanta 30 mL every 4 hours as needed for indigestion -Milk of Mag 30 mL as needed for constipation     #Opioid use disorder Opioid Use Disorder COWS Clonidine taper PRNs  -Tylenol 650 mg every 6 hours as needed for mild pain -Naproxen 500 mg BID as needed for pain -Bentyl 20 mg every 6 hours as needed for spasms/abdominal cramping -Robaxin 500 mg every 8 hours as needed for muscle spasms -Zofran 4 mg every 6 hours as needed for nausea or vomiting -Imodium 2 to 4 mg as needed for diarrhea or loose stools -Maalox/Mylanta 30 mL every 4 hours as needed for indigestion -Milk of Mag 30 mL as needed for constipation    Patient's care was discussed during the interdisciplinary team meeting every day during the hospitalization.  The patient was administered 1 tablet of Suboxone (8 mg buprenorphine / 2 mg naloxone) following 4 days in a controlled environment without opioid use. Soon after, she reported symptoms of worsening withdrawal, including agitation and irritability. Although these symptoms appeared following the Suboxone administration, the clinical presentation raised low concern for precipitated withdrawal given her six-day abstinence and absence of typical signs such as diaphoresis, piloerection, rhinorrhea, pupillary dilation, tachycardia, hypertension, nausea, and  vomiting.  To manage her distress and symptoms, the patient was administered 1 mg intramuscular lorazepam (Ativan) with continued monitoring for symptom resolution.  On day of discharge, the patient  reports that their mood is stable. The patient denied having suicidal thoughts for more than 48 hours prior to discharge.  Patient denies having homicidal thoughts.  Patient denies having auditory hallucinations.  Patient denies any visual hallucinations or other symptoms of psychosis. The patient was motivated to continue taking medication with a goal of continued improvement in mental health.   Supportive psychotherapy was provided, and the patient participated in regular group therapy sessions focused on managing cravings and withdrawal. Coping skills, problem-solving, and relaxation techniques were also part of the program's therapeutic interventions.  Labs were reviewed with the patient, and abnormal results were discussed with the patient.  The patient is able to verbalize their individual safety plan to this provider.  # It is recommended to the patient to continue psychiatric medications as prescribed, after discharge from the hospital.    # It is recommended to the patient to follow up with your outpatient psychiatric provider and PCP.  # It was discussed with the patient, the impact of alcohol, drugs, tobacco have been there overall psychiatric and medical wellbeing, and total abstinence from substance use was recommended the patient.ed.  # Prescriptions provided or sent directly to preferred pharmacy at discharge. Patient agreeable to plan. Given opportunity to ask questions. Appears to feel comfortable with discharge.    # In the event of worsening symptoms, the patient is instructed to call the crisis hotline, 911 and or go to the nearest ED for appropriate evaluation and treatment of symptoms. To follow-up with primary care provider for other medical issues, concerns and or health care needs  # Patient was discharged to [facility] with a plan to follow up as noted below.     Total Time spent with patient: 1.5 hours  Past Psychiatric History:  Dx: opioid use disorder, MDD, and  anxiety Currently not seeing a psychiatrist or therapist. Currently not taking any prescribed medication.  Took Suboxone was in June 2024  Past Medical History:         Past Medical History:  Diagnosis Date   ADHD (attention deficit hyperactivity disorder)     Anxiety     Cholecystitis     Depression     Dysmenorrhea     Endometriosis     GERD (gastroesophageal reflux disease)     Headache      migraines   Hyperlipidemia     Hypoglycemia     Polysubstance abuse (HCC)     Pregnancy complicated by subutex maintenance, antepartum (HCC)     Syncope      Family History:   Anesthesia problems Maternal Grandfather     Heart disease Maternal Grandfather     Nephrolithiasis Maternal Grandfather     Diabetes Maternal Grandfather     Mental illness Maternal Grandfather     Cholelithiasis Mother     Nephrolithiasis Mother     Depression Mother     Hypertension Mother     Miscarriages / Stillbirths Mother     Anxiety disorder Mother     Gout Father     Nephrolithiasis Maternal Grandmother     COPD Maternal Grandmother     Heart disease Paternal Grandfather     Cholelithiasis Maternal Aunt     Depression Maternal Aunt     Learning disabilities Maternal Aunt     Bipolar  disorder Sister      Social History: She lives with brother, patient lost custody of her daughter.   Current Medications:  No current facility-administered medications for this encounter.   Current Outpatient Medications  Medication Sig Dispense Refill   amoxicillin-clavulanate (AUGMENTIN) 875-125 MG tablet Take 1 tablet by mouth every 12 (twelve) hours. 5 tablet 0   DULoxetine (CYMBALTA) 30 MG capsule Take 1 capsule (30 mg total) by mouth daily. 30 capsule 0   hydrOXYzine (ATARAX) 25 MG tablet Take 1 tablet (25 mg total) by mouth 3 (three) times daily. 90 tablet 0   thiamine (VITAMIN B-1) 100 MG tablet Take 1 tablet (100 mg total) by mouth daily.      PTA Medications:  PTA Medications  Medication Sig    thiamine (VITAMIN B-1) 100 MG tablet Take 1 tablet (100 mg total) by mouth daily.   hydrOXYzine (ATARAX) 25 MG tablet Take 1 tablet (25 mg total) by mouth 3 (three) times daily.   amoxicillin-clavulanate (AUGMENTIN) 875-125 MG tablet Take 1 tablet by mouth every 12 (twelve) hours.   DULoxetine (CYMBALTA) 30 MG capsule Take 1 capsule (30 mg total) by mouth daily.   Facility Ordered Medications  Medication   [COMPLETED] thiamine (VITAMIN B1) injection 100 mg   [COMPLETED] OLANZapine (ZYPREXA) injection 5 mg   [EXPIRED] dicyclomine (BENTYL) tablet 20 mg   [EXPIRED] loperamide (IMODIUM) capsule 2-4 mg   [EXPIRED] methocarbamol (ROBAXIN) tablet 500 mg   [EXPIRED] ondansetron (ZOFRAN-ODT) disintegrating tablet 4 mg   [COMPLETED] thiamine (VITAMIN B1) injection 100 mg   [EXPIRED] LORazepam (ATIVAN) tablet 1 mg   [COMPLETED] LORazepam (ATIVAN) tablet 1 mg   Followed by   [COMPLETED] LORazepam (ATIVAN) tablet 1 mg   Followed by   [COMPLETED] LORazepam (ATIVAN) tablet 1 mg   Followed by   [COMPLETED] LORazepam (ATIVAN) tablet 1 mg   [COMPLETED] acetaminophen (TYLENOL) tablet 650 mg   [COMPLETED] benzocaine (ORAJEL) 10 % mucosal gel 1 Application   [COMPLETED] benzocaine (ORAJEL) 10 % mucosal gel 1 Application   [COMPLETED] LORazepam (ATIVAN) injection 1 mg       11/24/2022   11:49 AM 11/22/2022    3:51 PM 11/19/2022   11:37 AM  Depression screen PHQ 2/9  Decreased Interest 0 1   Down, Depressed, Hopeless 0 2 1  PHQ - 2 Score 0 3 1  Altered sleeping 0 1   Tired, decreased energy 0 2   Change in appetite 0 2   Feeling bad or failure about yourself  0 2   Trouble concentrating 0 2   Moving slowly or fidgety/restless 0 1   Suicidal thoughts 0 0   PHQ-9 Score 0 13   Difficult doing work/chores Not difficult at all      Flowsheet Row ED from 11/24/2022 in [facility] at [facility] ED from 11/19/2022 in [facility] ED from  11/18/2022 in [facility]  C-SSRS RISK CATEGORY No Risk High Risk No Risk       Musculoskeletal  Strength & Muscle Tone: within normal limits Gait & Station: normal Patient leans: N/A  Psychiatric Specialty Exam  Presentation  General Appearance:  Appropriate for Environment  Eye Contact: Good  Speech: Clear and Coherent; Normal Rate  Speech Volume: Normal  Handedness: -- (not assessed)   Mood and Affect  Mood: -- ("OK, much better")  Affect: Congruent; Full Range   Thought Process  Thought Processes: Linear; Coherent; Goal Directed  Descriptions of Associations:Intact  Orientation:-- (grossly intact)  Thought Content:Logical  Diagnosis of Schizophrenia or Schizoaffective disorder in past: No    Hallucinations:Hallucinations: None  Ideas of Reference:None  Suicidal Thoughts:Suicidal Thoughts: No  Homicidal Thoughts:Homicidal Thoughts: No   Sensorium  Memory: Immediate Good; Recent Good; Remote Good  Judgment: Fair  Insight: Fair   Chartered certified accountant: Fair  Attention Span: Fair  Recall: Fair  Fund of Knowledge: Fair  Language: Fair   Psychomotor Activity  Psychomotor Activity: Psychomotor Activity: Normal   Assets  Assets: Desire for Improvement; Resilience; Communication Skills   Sleep  Sleep: Sleep: Good   Physical Exam  Physical Exam Vitals and nursing note reviewed.  HENT:     Head: Normocephalic and atraumatic.  Pulmonary:     Effort: Pulmonary effort is normal. No respiratory distress.  Musculoskeletal:        General: Normal range of motion.  Skin:    General: Skin is warm and dry.  Neurological:     Mental Status: She is alert.    Review of Systems  All other systems reviewed and are negative.  Blood pressure 118/87, pulse (!) 120, temperature 98.3 F (36.8 C), temperature source Oral, resp. rate 20, SpO2 98%. There is no height or weight on file  to calculate BMI.  Demographic Factors:  Adolescent or young adult  Loss Factors: Legal issues  Historical Factors: NA  Risk Reduction Factors:   Positive social support  Continued Clinical Symptoms:  Alcohol/Substance Abuse/Dependencies  Cognitive Features That Contribute To Risk:  None    Suicide Risk:  Mild:  Suicidal ideation of limited frequency, intensity, duration, and specificity.  There are no identifiable plans, no associated intent, mild dysphoria and related symptoms, good self-control (both objective and subjective assessment), few other risk factors, and identifiable protective factors, including available and accessible social support.  Plan Of Care/Follow-up recommendations:  Activity: as tolerated  Diet: heart healthy  Other: -Follow-up with your outpatient psychiatric provider -instructions on appointment date, time, and address (location) are provided to you in discharge paperwork.  -Take your psychiatric medications as prescribed at discharge - instructions are provided to you in the discharge paperwork  -Follow-up with outpatient primary care doctor and other specialists -for management of preventative medicine and chronic medical disease, including:   Oral infection - Augmentin for 7 days   -Recommend abstinence from alcohol, tobacco, and other illicit drug use at discharge.   -If your psychiatric symptoms recur, worsen, or if you have side effects to your psychiatric medications, call your outpatient psychiatric provider, 911, [phone] or go to the nearest emergency department.  -If suicidal thoughts recur, call your outpatient psychiatric provider, 911, [phone] or go to the nearest emergency department.   Disposition: Robert Bellow, MD 11/25/2022, 8:51 AM

## 2022-11-25 NOTE — ED Triage Notes (Signed)
Pt presents via POV c/o emesis x2 days and "stomach burning". A&Ox4.

## 2022-11-26 ENCOUNTER — Emergency Department (HOSPITAL_COMMUNITY)
Admission: EM | Admit: 2022-11-26 | Discharge: 2022-11-27 | Discharge status: Against Medical Advice | Payer: Medicaid Other | Attending: Emergency Medicine | Admitting: Emergency Medicine

## 2022-11-26 ENCOUNTER — Encounter (HOSPITAL_COMMUNITY): Payer: Self-pay | Admitting: Emergency Medicine

## 2022-11-26 ENCOUNTER — Emergency Department (HOSPITAL_COMMUNITY): Payer: Medicaid Other

## 2022-11-26 DIAGNOSIS — R1013 Epigastric pain: Secondary | ICD-10-CM | POA: Diagnosis present

## 2022-11-26 DIAGNOSIS — Z5321 Procedure and treatment not carried out due to patient leaving prior to being seen by health care provider: Secondary | ICD-10-CM | POA: Diagnosis not present

## 2022-11-26 DIAGNOSIS — R112 Nausea with vomiting, unspecified: Secondary | ICD-10-CM | POA: Diagnosis not present

## 2022-11-26 LAB — COMPREHENSIVE METABOLIC PANEL
ALT: 43 U/L (ref 0–44)
ALT: 56 U/L — ABNORMAL HIGH (ref 0–44)
AST: 21 U/L (ref 15–41)
AST: 26 U/L (ref 15–41)
Albumin: 4.4 g/dL (ref 3.5–5.0)
Albumin: 4.6 g/dL (ref 3.5–5.0)
Alkaline Phosphatase: 84 U/L (ref 38–126)
Alkaline Phosphatase: 91 U/L (ref 38–126)
Anion gap: 10 (ref 5–15)
Anion gap: 13 (ref 5–15)
BUN: 23 mg/dL — ABNORMAL HIGH (ref 6–20)
BUN: 28 mg/dL — ABNORMAL HIGH (ref 6–20)
CO2: 24 mmol/L (ref 22–32)
CO2: 24 mmol/L (ref 22–32)
Calcium: 9.3 mg/dL (ref 8.9–10.3)
Calcium: 9.7 mg/dL (ref 8.9–10.3)
Chloride: 105 mmol/L (ref 98–111)
Chloride: 107 mmol/L (ref 98–111)
Creatinine, Ser: 0.65 mg/dL (ref 0.44–1.00)
Creatinine, Ser: 0.76 mg/dL (ref 0.44–1.00)
GFR, Estimated: >60 mL/min (ref >60)
GFR, Estimated: >60 mL/min (ref >60)
Glucose, Bld: 92 mg/dL (ref 70–99)
Glucose, Bld: 96 mg/dL (ref 70–99)
Potassium: 3.4 mmol/L — ABNORMAL LOW (ref 3.5–5.1)
Potassium: 3.5 mmol/L (ref 3.5–5.1)
Sodium: 141 mmol/L (ref 135–145)
Sodium: 142 mmol/L (ref 135–145)
Total Bilirubin: 1.2 mg/dL (ref 0.3–1.2)
Total Bilirubin: 1.2 mg/dL (ref 0.3–1.2)
Total Protein: 7.6 g/dL (ref 6.5–8.1)
Total Protein: 7.9 g/dL (ref 6.5–8.1)

## 2022-11-26 LAB — CBC WITH DIFFERENTIAL/PLATELET
Abs Immature Granulocytes: 0.01 10*3/uL (ref 0.00–0.07)
Basophils Absolute: 0 10*3/uL (ref 0.0–0.1)
Basophils Relative: 1 %
Eosinophils Absolute: 0 10*3/uL (ref 0.0–0.5)
Eosinophils Relative: 0 %
HCT: 40.9 % (ref 36.0–46.0)
Hemoglobin: 13.5 g/dL (ref 12.0–15.0)
Immature Granulocytes: 0 %
Lymphocytes Relative: 37 %
Lymphs Abs: 2 10*3/uL (ref 0.7–4.0)
MCH: 27.1 pg (ref 26.0–34.0)
MCHC: 33 g/dL (ref 30.0–36.0)
MCV: 82.1 fL (ref 80.0–100.0)
Monocytes Absolute: 0.5 10*3/uL (ref 0.1–1.0)
Monocytes Relative: 8 %
Neutro Abs: 3 10*3/uL (ref 1.7–7.7)
Neutrophils Relative %: 54 %
Platelets: 346 10*3/uL (ref 150–400)
RBC: 4.98 MIL/uL (ref 3.87–5.11)
RDW: 13.4 % (ref 11.5–15.5)
WBC: 5.5 10*3/uL (ref 4.0–10.5)
nRBC: 0 % (ref 0.0–0.2)

## 2022-11-26 LAB — LIPASE, BLOOD
Lipase: 29 U/L (ref 11–51)
Lipase: 35 U/L (ref 11–51)

## 2022-11-26 LAB — HCG, SERUM, QUALITATIVE: Preg, Serum: NEGATIVE

## 2022-11-26 LAB — HCG, QUANTITATIVE, PREGNANCY: hCG, Beta Chain, Quant, S: <1 m[IU]/mL (ref <5)

## 2022-11-26 NOTE — ED Provider Triage Note (Signed)
Emergency Medicine Provider Triage Evaluation Note  [name] , a 27 y.o. female  was evaluated in triage.  Pt complains of epigastric pain, nausea, vomiting.  Review of Systems  Positive:  Negative:   Physical Exam  BP 121/77   Pulse 66   Temp 98.4 F (36.9 C) (Oral)   Resp 17   Ht 5\' 6"  (1.676 m)   Wt 93 kg   SpO2 99%   BMI 33.09 kg/m  Gen:   Awake, no distress   Resp:  Normal effort  MSK:   Moves extremities without difficulty  Other:    Medical Decision Making  Medically screening exam initiated at 8:21 PM.  Appropriate orders placed.  [name] was informed that the remainder of the evaluation will be completed by another provider, this initial triage assessment does not replace that evaluation, and the importance of remaining in the ED until their evaluation is complete.  Epigastric pain, nausea, vomiting x3 days. Came to ED yesterday but left. States she cannot keep any food/water down. Last BM today. LMP 2 months ago. Not on birth control. Denies fever, chest pain, dyspnea, diarrhea.   Dorthy Cooler, [place] 11/26/22 2022

## 2022-11-26 NOTE — ED Triage Notes (Signed)
  Patient BIB EMS for epigastric abdominal pain that has been going on for  3-4 days.  Patient endorses sharp pain and has had several episodes of emesis today.  Was here the past two days for same but left after triage/labs.  Pain 6/10, sharp.  Given 4 mg zofran IV by EMS and states she feels better.

## 2022-11-26 NOTE — ED Notes (Signed)
No answer when called to room x3

## 2022-11-26 NOTE — ED Notes (Signed)
No answer when called to room

## 2022-11-27 NOTE — ED Notes (Signed)
Pt called 3rd time for vitals. No response. Pt not visualized in lobby.

## 2022-11-27 NOTE — ED Notes (Signed)
Patient called for vitals. Pt did not respond.

## 2022-11-27 NOTE — ED Notes (Signed)
Pt called for vitals for second time. Pt did not answer. Pt not visualized in lobby.

## 2022-12-27 IMAGING — US US MFM OB FOLLOW-UP
1 series · 13 of 28 positions shown · non-contrast
Comparison: none

[Series 1: us mfm ob follow-up · 36 acquisitions, 13 frames shown]
[im 2/36]
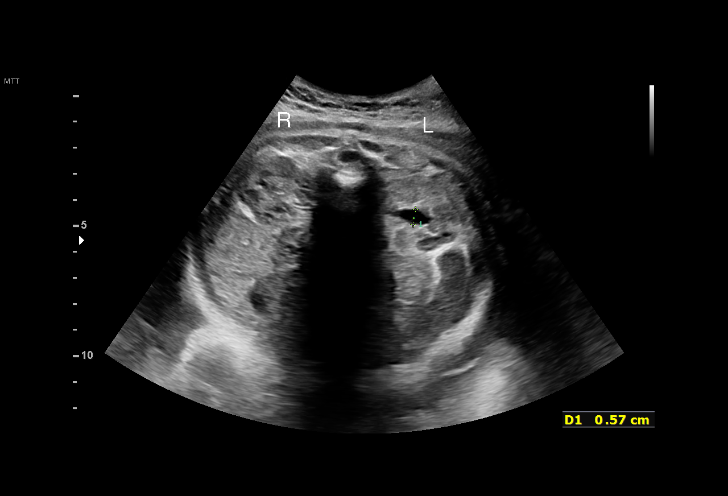
[im 4/36]
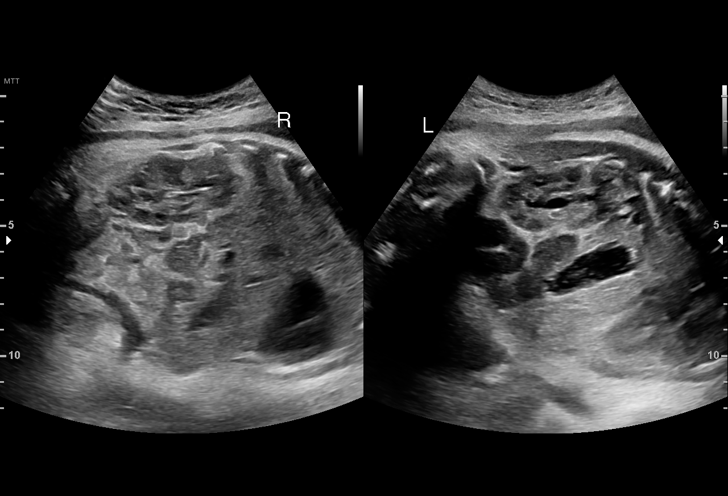
[im 7/36]
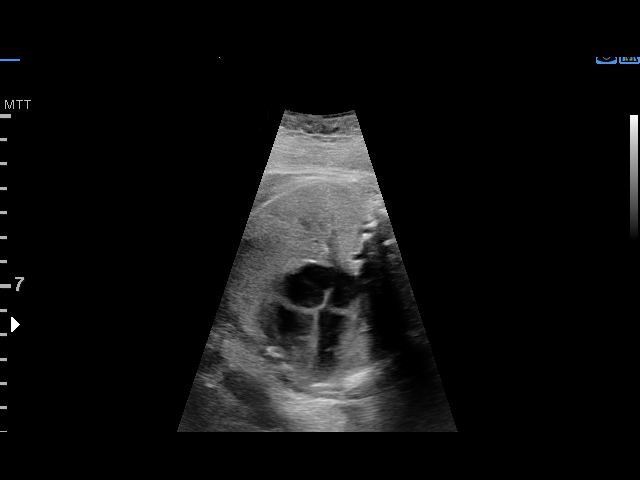
[im 10/36]
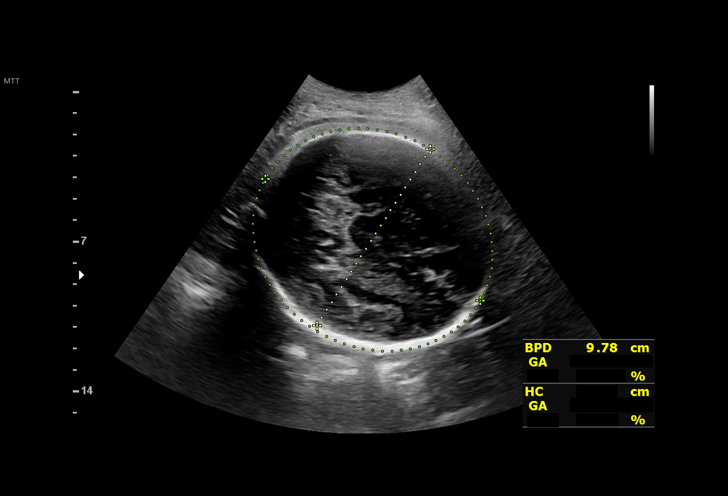
[im 12/36]
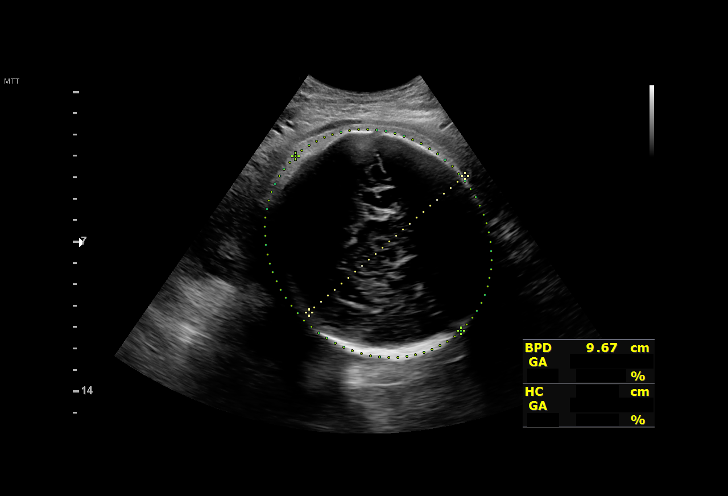
[im 15/36]
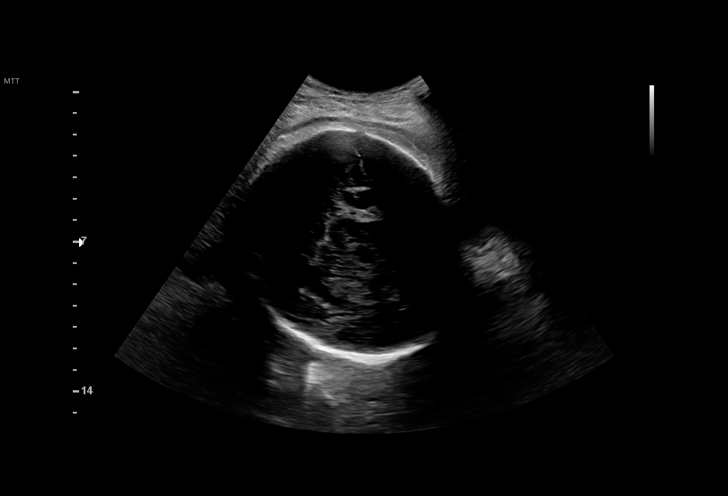
[im 19/36]
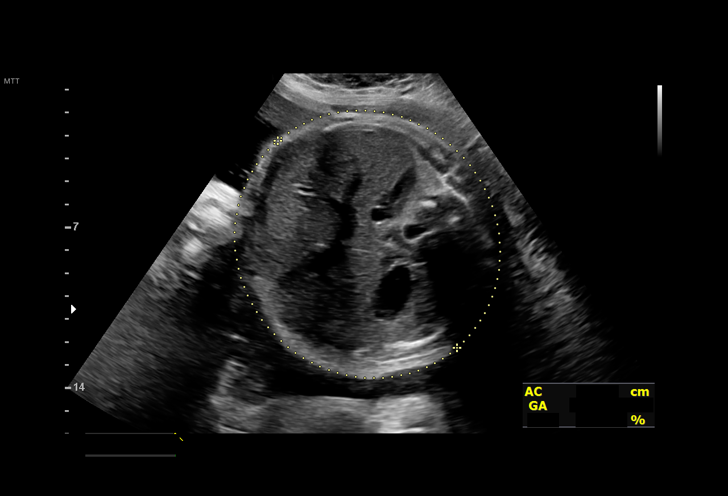
[im 21/36]
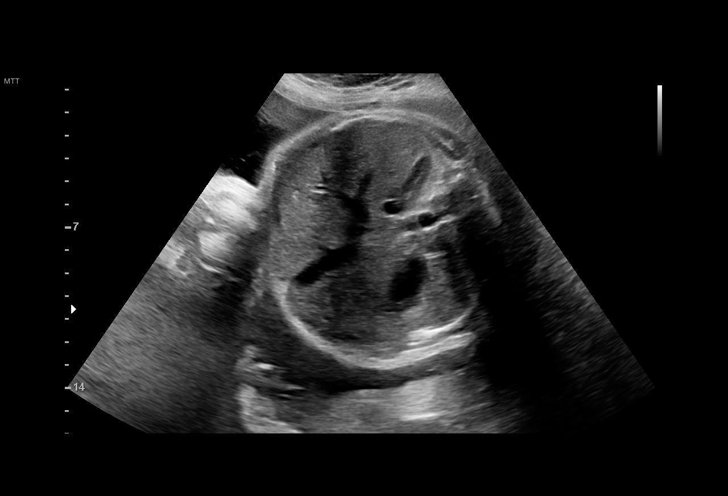
[im 24/36]
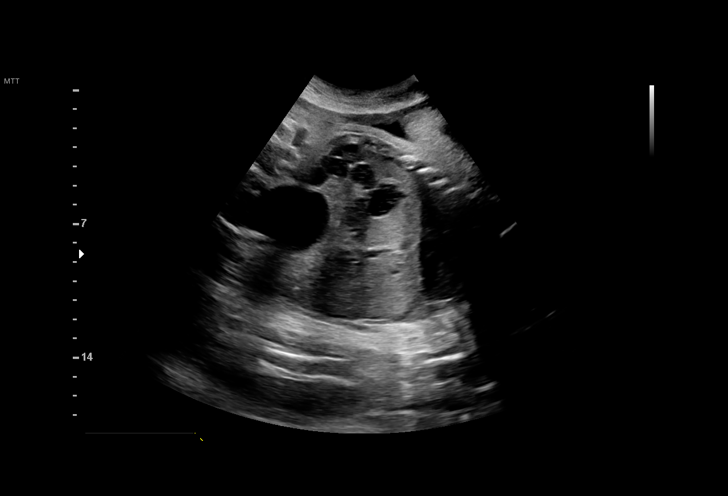
[im 26/36]
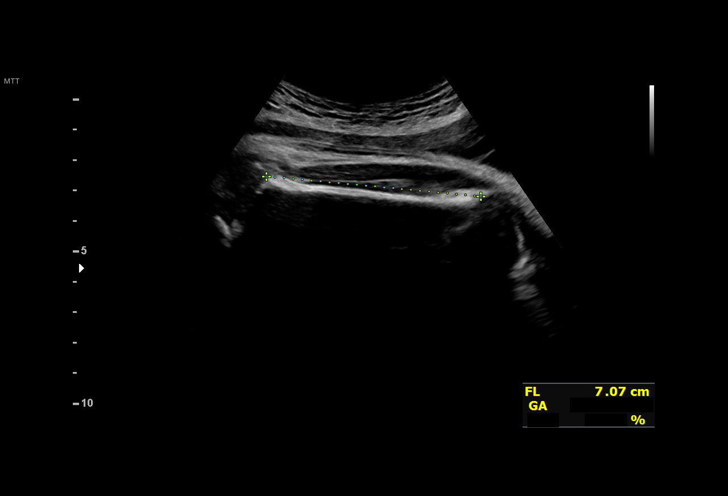
[im 29/36]
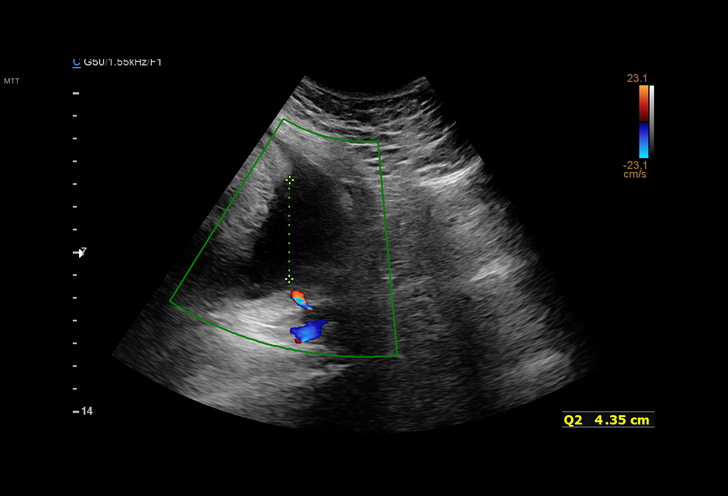
[im 32/36]
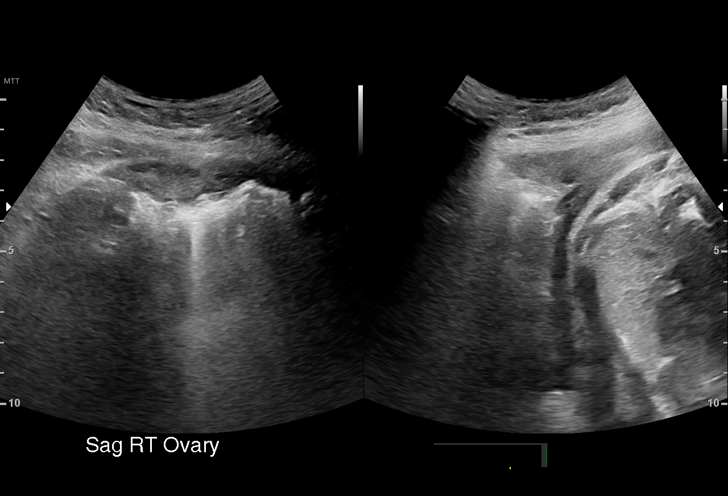
[im 34/36]
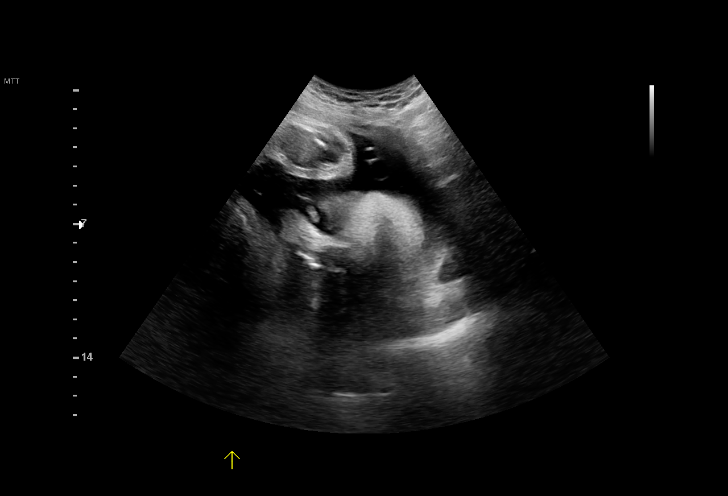

[13 of 28 positions shown; findings below may reference images not displayed]

Indications

 37 weeks gestation of pregnancy
 Pregnancy comlicated by subutex                O99.320, F11.20,
 maintenance, antepartum
 Tobacco use complicating pregnancy
 History of polysubstance abuse
 Genetic carrier - Choolhun Taab silent
 Abnormal fetal ultrasound (2-VC)
 Encounter for other antenatal screening
 follow-up
Fetal Evaluation

 Num Of Fetuses:         1
 Fetal Heart Rate(bpm):  135
 Cardiac Activity:       Observed
 Presentation:           Cephalic
 Placenta:               Posterior
 P. Cord Insertion:      Previously Visualized

 Amniotic Fluid
 AFI FV:      Subjectively upper-normal

 AFI Sum(cm)     %Tile       Largest Pocket(cm)
 23.1            90

 RUQ(cm)       RLQ(cm)       LUQ(cm)        LLQ(cm)

Biometry

 BPD:      98.1  mm     G. Age:  40w 1d       > 99  %    CI:        84.17   %    70 - 86
                                                         FL/HC:      20.5   %    20.8 -
 HC:       337   mm     G. Age:  38w 4d         59  %    HC/AC:      0.94        0.92 -
 AC:      360.4  mm     G. Age:  40w 0d       > 99  %    FL/BPD:     70.4   %    71 - 87
 FL:       69.1  mm     G. Age:  35w 3d         11  %    FL/AC:      19.2   %    20 - 24

 LV:        4.9  mm

 Est. FW:    3235  gm           8 lb     92  %
OB History

 Gravidity:    2
 TOP:          1        Living:  0
Gestational Age

 LMP:           37w 2d        Date:  11/26/19                 EDD:   09/01/20
 U/S Today:     38w 4d                                        EDD:   08/23/20
 Best:          37w 2d     Det. By:  LMP  (11/26/19)          EDD:   09/01/20
Anatomy

 Cranium:               Appears normal         LVOT:                   Previously seen
 Cavum:                 Appears normal         Aortic Arch:            Previously seen
 Ventricles:            Appears normal         Ductal Arch:            Previously seen
 Choroid Plexus:        Previously seen        Diaphragm:              Appears normal
 Cerebellum:            Previously seen        Stomach:                Appears normal, left
                                                                       sided
 Posterior Fossa:       Previously seen        Abdomen:                Previously seen
 Nuchal Fold:           Previously seen        Abdominal Wall:         Previously seen
 Face:                  Orbits and profile     Cord Vessels:           2 VC prev. , absent
                        previously seen
                                                                       Rawnok Mazba
 Lips:                  Previously seen        Kidneys:                Appear normal
 Palate:                Previously seen        Bladder:                Appears normal
 Thoracic:              Appears normal         Spine:                  Previously seen
 Heart:                 Appears normal         Upper Extremities:      Previously seen
                        (4CH, axis, and
                        situs)
 RVOT:                  Previously seen        Lower Extremities:      Previously seen

 Other:  SVC/ IVC , 3VV/T visualized previously. Heels, 5th digit, and Nasal
         bone visualized previously. Male gender previously seen. Technically
         difficult due to advanced GA and fetal position.
Cervix Uterus Adnexa

 Cervix
 Normal appearance by transabdominal scan.

 Uterus
 No abnormality visualized.

 Right Ovary
 Within normal limits.
 Left Ovary
 Within normal limits.

 Cul De Sac
 No free fluid seen.

 Adnexa
 No abnormality visualized.
Impression

 Follow up growth due to single umbilical artery and elevated
 BMI.
 Normal interval growth with measurements consistent with
 large for gestational age.
 Good fetal movement and amniotic fluid volume
Recommendations

 Continue weekly testing.

## 2023-04-20 ENCOUNTER — Emergency Department (HOSPITAL_COMMUNITY): Payer: MEDICAID | Admitting: Anesthesiology

## 2023-04-20 ENCOUNTER — Inpatient Hospital Stay (HOSPITAL_COMMUNITY)
Admission: EM | Admit: 2023-04-20 | Discharge: 2023-05-01 | DRG: 957 | Discharge status: Against Medical Advice | Payer: MEDICAID | Attending: General Surgery | Admitting: General Surgery

## 2023-04-20 ENCOUNTER — Emergency Department (HOSPITAL_COMMUNITY): Payer: MEDICAID

## 2023-04-20 ENCOUNTER — Encounter (HOSPITAL_COMMUNITY): Admission: EM | Discharge status: Against Medical Advice | Payer: Self-pay | Source: Home / Self Care

## 2023-04-20 ENCOUNTER — Inpatient Hospital Stay (HOSPITAL_COMMUNITY): Payer: MEDICAID

## 2023-04-20 DIAGNOSIS — F1110 Opioid abuse, uncomplicated: Secondary | ICD-10-CM | POA: Diagnosis present

## 2023-04-20 DIAGNOSIS — Z9889 Other specified postprocedural states: Secondary | ICD-10-CM

## 2023-04-20 DIAGNOSIS — S32019A Unspecified fracture of first lumbar vertebra, initial encounter for closed fracture: Secondary | ICD-10-CM | POA: Diagnosis present

## 2023-04-20 DIAGNOSIS — Z5329 Procedure and treatment not carried out because of patient's decision for other reasons: Secondary | ICD-10-CM | POA: Diagnosis present

## 2023-04-20 DIAGNOSIS — S32302B Unspecified fracture of left ilium, initial encounter for open fracture: Principal | ICD-10-CM | POA: Diagnosis present

## 2023-04-20 DIAGNOSIS — Y92410 Unspecified street and highway as the place of occurrence of the external cause: Secondary | ICD-10-CM | POA: Diagnosis not present

## 2023-04-20 DIAGNOSIS — Z6841 Body Mass Index (BMI) 40.0 and over, adult: Secondary | ICD-10-CM | POA: Diagnosis not present

## 2023-04-20 DIAGNOSIS — F419 Anxiety disorder, unspecified: Secondary | ICD-10-CM | POA: Diagnosis not present

## 2023-04-20 DIAGNOSIS — S42401A Unspecified fracture of lower end of right humerus, initial encounter for closed fracture: Secondary | ICD-10-CM | POA: Diagnosis present

## 2023-04-20 DIAGNOSIS — K661 Hemoperitoneum: Secondary | ICD-10-CM | POA: Diagnosis present

## 2023-04-20 DIAGNOSIS — S2242XA Multiple fractures of ribs, left side, initial encounter for closed fracture: Secondary | ICD-10-CM | POA: Diagnosis present

## 2023-04-20 DIAGNOSIS — Z23 Encounter for immunization: Secondary | ICD-10-CM

## 2023-04-20 DIAGNOSIS — S31611A Laceration without foreign body of abdominal wall, left upper quadrant with penetration into peritoneal cavity, initial encounter: Secondary | ICD-10-CM | POA: Diagnosis present

## 2023-04-20 DIAGNOSIS — D62 Acute posthemorrhagic anemia: Secondary | ICD-10-CM | POA: Diagnosis not present

## 2023-04-20 DIAGNOSIS — J9601 Acute respiratory failure with hypoxia: Secondary | ICD-10-CM | POA: Diagnosis not present

## 2023-04-20 DIAGNOSIS — S22089A Unspecified fracture of T11-T12 vertebra, initial encounter for closed fracture: Secondary | ICD-10-CM | POA: Diagnosis present

## 2023-04-20 DIAGNOSIS — S81011A Laceration without foreign body, right knee, initial encounter: Secondary | ICD-10-CM | POA: Diagnosis present

## 2023-04-20 DIAGNOSIS — E66813 Obesity, class 3: Secondary | ICD-10-CM | POA: Diagnosis present

## 2023-04-20 DIAGNOSIS — F430 Acute stress reaction: Secondary | ICD-10-CM | POA: Diagnosis not present

## 2023-04-20 DIAGNOSIS — S22009A Unspecified fracture of unspecified thoracic vertebra, initial encounter for closed fracture: Secondary | ICD-10-CM

## 2023-04-20 DIAGNOSIS — S32009A Unspecified fracture of unspecified lumbar vertebra, initial encounter for closed fracture: Secondary | ICD-10-CM

## 2023-04-20 DIAGNOSIS — Z91199 Patient's noncompliance with other medical treatment and regimen due to unspecified reason: Secondary | ICD-10-CM | POA: Diagnosis not present

## 2023-04-20 DIAGNOSIS — S36031A Moderate laceration of spleen, initial encounter: Secondary | ICD-10-CM | POA: Diagnosis present

## 2023-04-20 DIAGNOSIS — S3729XA Other injury of bladder, initial encounter: Secondary | ICD-10-CM | POA: Diagnosis present

## 2023-04-20 DIAGNOSIS — S82852A Displaced trimalleolar fracture of left lower leg, initial encounter for closed fracture: Secondary | ICD-10-CM | POA: Diagnosis present

## 2023-04-20 DIAGNOSIS — S3600XA Unspecified injury of spleen, initial encounter: Secondary | ICD-10-CM

## 2023-04-20 DIAGNOSIS — S71012A Laceration without foreign body, left hip, initial encounter: Secondary | ICD-10-CM | POA: Diagnosis present

## 2023-04-20 DIAGNOSIS — S82892A Other fracture of left lower leg, initial encounter for closed fracture: Secondary | ICD-10-CM | POA: Diagnosis not present

## 2023-04-20 DIAGNOSIS — F418 Other specified anxiety disorders: Secondary | ICD-10-CM | POA: Diagnosis not present

## 2023-04-20 DIAGNOSIS — S21112A Laceration without foreign body of left front wall of thorax without penetration into thoracic cavity, initial encounter: Secondary | ICD-10-CM | POA: Diagnosis present

## 2023-04-20 DIAGNOSIS — R52 Pain, unspecified: Secondary | ICD-10-CM | POA: Diagnosis present

## 2023-04-20 HISTORY — DX: Sedative, hypnotic or anxiolytic dependence, uncomplicated: F13.20

## 2023-04-20 HISTORY — PX: INCISION AND DRAINAGE OF WOUND: SHX1803

## 2023-04-20 HISTORY — DX: Other psychoactive substance use, unspecified with psychoactive substance-induced mood disorder: F19.94

## 2023-04-20 HISTORY — DX: Morbid (severe) obesity due to excess calories: E66.01

## 2023-04-20 HISTORY — PX: LAPAROTOMY: SHX154

## 2023-04-20 HISTORY — DX: Cocaine abuse, uncomplicated: F14.10

## 2023-04-20 HISTORY — DX: Other psychoactive substance abuse, uncomplicated: F19.10

## 2023-04-20 HISTORY — DX: Tobacco use: Z72.0

## 2023-04-20 HISTORY — PX: BLADDER REPAIR: SHX6721

## 2023-04-20 HISTORY — DX: Opioid dependence, uncomplicated: F11.20

## 2023-04-20 HISTORY — DX: Anxiety disorder, unspecified: F41.9

## 2023-04-20 LAB — POCT I-STAT 7, (LYTES, BLD GAS, ICA,H+H)
Acid-base deficit: 3 mmol/L — ABNORMAL HIGH (ref 0.0–2.0)
Acid-base deficit: 4 mmol/L — ABNORMAL HIGH (ref 0.0–2.0)
Acid-base deficit: 5 mmol/L — ABNORMAL HIGH (ref 0.0–2.0)
Bicarbonate: 23.6 mmol/L (ref 20.0–28.0)
Bicarbonate: 21 mmol/L (ref 20.0–28.0)
Bicarbonate: 22.3 mmol/L (ref 20.0–28.0)
Calcium, Ion: 1.13 mmol/L — ABNORMAL LOW (ref 1.15–1.40)
Calcium, Ion: 1.05 mmol/L — ABNORMAL LOW (ref 1.15–1.40)
Calcium, Ion: 1.12 mmol/L — ABNORMAL LOW (ref 1.15–1.40)
HCT: 19 % — ABNORMAL LOW (ref 36.0–46.0)
HCT: 26 % — ABNORMAL LOW (ref 36.0–46.0)
HCT: 30 % — ABNORMAL LOW (ref 36.0–46.0)
Hemoglobin: 6.5 g/dL — CL (ref 12.0–15.0)
Hemoglobin: 8.8 g/dL — ABNORMAL LOW (ref 12.0–15.0)
Hemoglobin: 10.2 g/dL — ABNORMAL LOW (ref 12.0–15.0)
O2 Saturation: 100 %
O2 Saturation: 100 %
O2 Saturation: 100 %
Patient temperature: 97.6
Potassium: 3.9 mmol/L (ref 3.5–5.1)
Potassium: 4.5 mmol/L (ref 3.5–5.1)
Potassium: 3.9 mmol/L (ref 3.5–5.1)
Sodium: 139 mmol/L (ref 135–145)
Sodium: 139 mmol/L (ref 135–145)
Sodium: 140 mmol/L (ref 135–145)
TCO2: 25 mmol/L (ref 22–32)
TCO2: 22 mmol/L (ref 22–32)
TCO2: 24 mmol/L (ref 22–32)
pCO2 arterial: 50.8 mmHg — ABNORMAL HIGH (ref 32–48)
pCO2 arterial: 38.2 mmHg (ref 32–48)
pCO2 arterial: 48 mmHg (ref 32–48)
pH, Arterial: 7.275 — ABNORMAL LOW (ref 7.35–7.45)
pH, Arterial: 7.347 — ABNORMAL LOW (ref 7.35–7.45)
pH, Arterial: 7.272 — ABNORMAL LOW (ref 7.35–7.45)
pO2, Arterial: 301 mmHg — ABNORMAL HIGH (ref 83–108)
pO2, Arterial: 267 mmHg — ABNORMAL HIGH (ref 83–108)
pO2, Arterial: 503 mmHg — ABNORMAL HIGH (ref 83–108)

## 2023-04-20 LAB — CBC
HCT: 32.7 % — ABNORMAL LOW (ref 36.0–46.0)
Hemoglobin: 10.6 g/dL — ABNORMAL LOW (ref 12.0–15.0)
MCH: 25.5 pg — ABNORMAL LOW (ref 26.0–34.0)
MCHC: 32.4 g/dL (ref 30.0–36.0)
MCV: 78.8 fL — ABNORMAL LOW (ref 80.0–100.0)
Platelets: 301 10*3/uL (ref 150–400)
RBC: 4.15 MIL/uL (ref 3.87–5.11)
RDW: 13.5 % (ref 11.5–15.5)
WBC: 8.5 10*3/uL (ref 4.0–10.5)
nRBC: 0 % (ref 0.0–0.2)

## 2023-04-20 LAB — I-STAT CHEM 8, ED
BUN: 20 mg/dL (ref 8–23)
Calcium, Ion: 1.08 mmol/L — ABNORMAL LOW (ref 1.15–1.40)
Chloride: 105 mmol/L (ref 98–111)
Creatinine, Ser: 2.5 mg/dL — ABNORMAL HIGH (ref 0.44–1.00)
Glucose, Bld: 120 mg/dL — ABNORMAL HIGH (ref 70–99)
HCT: 31 % — ABNORMAL LOW (ref 36.0–46.0)
Hemoglobin: 10.5 g/dL — ABNORMAL LOW (ref 12.0–15.0)
Potassium: 3.7 mmol/L (ref 3.5–5.1)
Sodium: 139 mmol/L (ref 135–145)
TCO2: 23 mmol/L (ref 22–32)

## 2023-04-20 LAB — SAMPLE TO BLOOD BANK

## 2023-04-20 LAB — PROTIME-INR
INR: 0.9 (ref 0.8–1.2)
Prothrombin Time: 12.8 s (ref 11.4–15.2)

## 2023-04-20 LAB — PREPARE RBC (CROSSMATCH)

## 2023-04-20 LAB — ABO/RH: ABO/RH(D): A POS

## 2023-04-20 LAB — I-STAT CG4 LACTIC ACID, ED: Lactic Acid, Venous: 1.6 mmol/L (ref 0.5–1.9)

## 2023-04-20 LAB — ETHANOL: Alcohol, Ethyl (B): <10 mg/dL (ref <10)

## 2023-04-20 SURGERY — IRRIGATION AND DEBRIDEMENT WOUND
Anesthesia: General | Site: Pelvis

## 2023-04-20 MED ORDER — FENTANYL CITRATE PF 50 MCG/ML IJ SOSY
PREFILLED_SYRINGE | INTRAMUSCULAR | Status: AC
  Filled 2023-04-20: qty 1

## 2023-04-20 MED ORDER — LIDOCAINE 2% (20 MG/ML) 5 ML SYRINGE
INTRAMUSCULAR | Status: AC
  Filled 2023-04-20: qty 5

## 2023-04-20 MED ORDER — SUCCINYLCHOLINE CHLORIDE 200 MG/10ML IV SOSY
PREFILLED_SYRINGE | INTRAVENOUS | Status: AC
  Filled 2023-04-20: qty 10

## 2023-04-20 MED ORDER — TETANUS-DIPHTH-ACELL PERTUSSIS 5-2.5-18.5 LF-MCG/0.5 IM SUSY
0.5000 mL | PREFILLED_SYRINGE | Freq: Once | INTRAMUSCULAR | Status: AC
  Administered 2023-04-20: 0.5 mL via INTRAMUSCULAR

## 2023-04-20 MED ORDER — LACTATED RINGERS IV SOLN: INTRAVENOUS | Status: DC | PRN

## 2023-04-20 MED ORDER — FENTANYL 2500MCG IN NS 250ML (10MCG/ML) PREMIX INFUSION
0.0000 ug/h | INTRAVENOUS | Status: DC
  Administered 2023-04-20: 50 ug/h via INTRAVENOUS
  Administered 2023-04-21: 150 ug/h via INTRAVENOUS
  Administered 2023-04-22 (×2): 200 ug/h via INTRAVENOUS
  Administered 2023-04-23 (×2): 250 ug/h via INTRAVENOUS
  Administered 2023-04-24: 350 ug/h via INTRAVENOUS
  Administered 2023-04-24: 375 ug/h via INTRAVENOUS
  Administered 2023-04-24: 400 ug/h via INTRAVENOUS
  Administered 2023-04-24: 250 ug/h via INTRAVENOUS
  Administered 2023-04-25: 350 ug/h via INTRAVENOUS
  Filled 2023-04-20 (×11): qty 250

## 2023-04-20 MED ORDER — ONDANSETRON HCL 4 MG/2ML IJ SOLN: 4.0000 mg | Freq: Four times a day (QID) | INTRAMUSCULAR | Status: DC | PRN

## 2023-04-20 MED ORDER — ROCURONIUM BROMIDE 10 MG/ML (PF) SYRINGE
PREFILLED_SYRINGE | INTRAVENOUS | Status: DC | PRN
  Administered 2023-04-20 (×3): 50 mg via INTRAVENOUS

## 2023-04-20 MED ORDER — CEFAZOLIN SODIUM-DEXTROSE 1-4 GM/50ML-% IV SOLN
1.0000 g | Freq: Once | INTRAVENOUS | Status: AC
  Administered 2023-04-20: 1 g via INTRAVENOUS

## 2023-04-20 MED ORDER — PHENYLEPHRINE 80 MCG/ML (10ML) SYRINGE FOR IV PUSH (FOR BLOOD PRESSURE SUPPORT)
PREFILLED_SYRINGE | INTRAVENOUS | Status: DC | PRN
  Administered 2023-04-20 (×2): 160 ug via INTRAVENOUS

## 2023-04-20 MED ORDER — HYDRALAZINE HCL 20 MG/ML IJ SOLN: 10.0000 mg | INTRAMUSCULAR | Status: DC | PRN

## 2023-04-20 MED ORDER — METHOCARBAMOL 500 MG PO TABS
500.0000 mg | ORAL_TABLET | Freq: Three times a day (TID) | ORAL | Status: AC
  Administered 2023-04-20 – 2023-04-23 (×6): 500 mg
  Filled 2023-04-20 (×7): qty 1

## 2023-04-20 MED ORDER — CHLORHEXIDINE GLUCONATE CLOTH 2 % EX PADS
6.0000 | MEDICATED_PAD | Freq: Every day | CUTANEOUS | Status: DC
  Administered 2023-04-21 – 2023-05-01 (×11): 6 via TOPICAL

## 2023-04-20 MED ORDER — FENTANYL CITRATE (PF) 250 MCG/5ML IJ SOLN
INTRAMUSCULAR | Status: AC
  Filled 2023-04-20: qty 5

## 2023-04-20 MED ORDER — FENTANYL CITRATE (PF) 250 MCG/5ML IJ SOLN
INTRAMUSCULAR | Status: DC | PRN
  Administered 2023-04-20: 50 ug via INTRAVENOUS
  Administered 2023-04-20: 100 ug via INTRAVENOUS
  Administered 2023-04-20: 50 ug via INTRAVENOUS

## 2023-04-20 MED ORDER — POLYETHYLENE GLYCOL 3350 17 G PO PACK
17.0000 g | PACK | Freq: Every day | ORAL | Status: DC
  Administered 2023-04-20 – 2023-04-25 (×4): 17 g
  Filled 2023-04-20 (×4): qty 1

## 2023-04-20 MED ORDER — ROCURONIUM BROMIDE 10 MG/ML (PF) SYRINGE
PREFILLED_SYRINGE | INTRAVENOUS | Status: AC
  Filled 2023-04-20: qty 40

## 2023-04-20 MED ORDER — PROPOFOL 500 MG/50ML IV EMUL
INTRAVENOUS | Status: DC | PRN
  Administered 2023-04-20: 50 ug/kg/min via INTRAVENOUS

## 2023-04-20 MED ORDER — FENTANYL BOLUS VIA INFUSION
50.0000 ug | INTRAVENOUS | Status: DC | PRN
  Administered 2023-04-21: 75 ug via INTRAVENOUS
  Administered 2023-04-22 – 2023-04-24 (×13): 100 ug via INTRAVENOUS

## 2023-04-20 MED ORDER — METHOCARBAMOL 1000 MG/10ML IJ SOLN
500.0000 mg | Freq: Three times a day (TID) | INTRAMUSCULAR | Status: AC
  Administered 2023-04-21 – 2023-04-22 (×2): 500 mg via INTRAVENOUS
  Filled 2023-04-20 (×2): qty 10

## 2023-04-20 MED ORDER — 0.9 % SODIUM CHLORIDE (POUR BTL) OPTIME
TOPICAL | Status: DC | PRN
  Administered 2023-04-20: 3000 mL

## 2023-04-20 MED ORDER — POLYETHYLENE GLYCOL 3350 17 G PO PACK: 17.0000 g | PACK | Freq: Every day | ORAL | Status: DC | PRN

## 2023-04-20 MED ORDER — IOHEXOL 350 MG/ML SOLN
75.0000 mL | Freq: Once | INTRAVENOUS | Status: AC | PRN
  Administered 2023-04-20: 75 mL via INTRAVENOUS

## 2023-04-20 MED ORDER — PHENYLEPHRINE HCL-NACL 20-0.9 MG/250ML-% IV SOLN
INTRAVENOUS | Status: DC | PRN
  Administered 2023-04-20: 30 ug/min via INTRAVENOUS

## 2023-04-20 MED ORDER — DEXAMETHASONE SODIUM PHOSPHATE 10 MG/ML IJ SOLN
INTRAMUSCULAR | Status: DC | PRN
  Administered 2023-04-20: 5 mg via INTRAVENOUS

## 2023-04-20 MED ORDER — LIDOCAINE 2% (20 MG/ML) 5 ML SYRINGE
INTRAMUSCULAR | Status: DC | PRN
  Administered 2023-04-20: 60 mg via INTRAVENOUS

## 2023-04-20 MED ORDER — DOCUSATE SODIUM 50 MG/5ML PO LIQD
100.0000 mg | Freq: Two times a day (BID) | ORAL | Status: DC
  Administered 2023-04-20 – 2023-04-25 (×9): 100 mg
  Filled 2023-04-20 (×9): qty 10

## 2023-04-20 MED ORDER — SODIUM CHLORIDE 0.9 % IV SOLN
2.0000 g | INTRAVENOUS | Status: AC
Start: 2023-04-20 — End: 2023-04-22
  Administered 2023-04-20 – 2023-04-22 (×3): 2 g via INTRAVENOUS
  Filled 2023-04-20 (×4): qty 20

## 2023-04-20 MED ORDER — DEXAMETHASONE SODIUM PHOSPHATE 10 MG/ML IJ SOLN
INTRAMUSCULAR | Status: AC
  Filled 2023-04-20: qty 1

## 2023-04-20 MED ORDER — ONDANSETRON HCL 4 MG/2ML IJ SOLN
INTRAMUSCULAR | Status: AC
  Filled 2023-04-20: qty 2

## 2023-04-20 MED ORDER — SODIUM CHLORIDE 0.9 % IR SOLN
Status: DC | PRN
  Administered 2023-04-20: 6000 mL

## 2023-04-20 MED ORDER — PROPOFOL 10 MG/ML IV BOLUS
INTRAVENOUS | Status: DC | PRN
  Administered 2023-04-20: 100 mg via INTRAVENOUS

## 2023-04-20 MED ORDER — CEFAZOLIN SODIUM-DEXTROSE 2-4 GM/100ML-% IV SOLN
2.0000 g | Freq: Once | INTRAVENOUS | Status: AC
  Administered 2023-04-20: 2 g via INTRAVENOUS

## 2023-04-20 MED ORDER — SODIUM CHLORIDE 0.9% IV SOLUTION: Freq: Once | INTRAVENOUS | Status: DC

## 2023-04-20 MED ORDER — ONDANSETRON HCL 4 MG PO TABS: 4.0000 mg | ORAL_TABLET | Freq: Four times a day (QID) | ORAL | Status: DC | PRN

## 2023-04-20 MED ORDER — ACETAMINOPHEN 500 MG PO TABS
1000.0000 mg | ORAL_TABLET | Freq: Four times a day (QID) | ORAL | Status: DC
  Administered 2023-04-21 – 2023-04-25 (×18): 1000 mg
  Filled 2023-04-20 (×19): qty 2

## 2023-04-20 MED ORDER — SODIUM CHLORIDE 0.9 % IV SOLN: INTRAVENOUS | Status: DC | PRN

## 2023-04-20 MED ORDER — SUCCINYLCHOLINE CHLORIDE 200 MG/10ML IV SOSY
PREFILLED_SYRINGE | INTRAVENOUS | Status: DC | PRN
  Administered 2023-04-20: 120 mg via INTRAVENOUS

## 2023-04-20 MED ORDER — FENTANYL CITRATE PF 50 MCG/ML IJ SOSY: 50.0000 ug | PREFILLED_SYRINGE | Freq: Once | INTRAMUSCULAR | Status: DC

## 2023-04-20 MED ORDER — CEFAZOLIN SODIUM-DEXTROSE 3-4 GM/150ML-% IV SOLN
3.0000 g | Freq: Three times a day (TID) | INTRAVENOUS | Status: DC
  Filled 2023-04-20 (×2): qty 150

## 2023-04-20 MED ORDER — METOPROLOL TARTRATE 5 MG/5ML IV SOLN: 5.0000 mg | Freq: Four times a day (QID) | INTRAVENOUS | Status: DC | PRN

## 2023-04-20 MED ORDER — PROPOFOL 10 MG/ML IV BOLUS
INTRAVENOUS | Status: AC
  Filled 2023-04-20: qty 20

## 2023-04-20 MED ORDER — PROPOFOL 1000 MG/100ML IV EMUL
0.0000 ug/kg/min | INTRAVENOUS | Status: DC
  Administered 2023-04-20: 50 ug/kg/min via INTRAVENOUS

## 2023-04-20 MED ORDER — DOCUSATE SODIUM 100 MG PO CAPS
100.0000 mg | ORAL_CAPSULE | Freq: Two times a day (BID) | ORAL | Status: DC
Start: 2023-04-20 — End: 2023-04-20

## 2023-04-20 MED ORDER — PHENYLEPHRINE 80 MCG/ML (10ML) SYRINGE FOR IV PUSH (FOR BLOOD PRESSURE SUPPORT)
PREFILLED_SYRINGE | INTRAVENOUS | Status: AC
Start: 2023-04-20 — End: ?
  Filled 2023-04-20: qty 20

## 2023-04-20 MED ORDER — ALBUMIN HUMAN 5 % IV SOLN
INTRAVENOUS | Status: DC | PRN
Start: 2023-04-20 — End: 2023-04-20

## 2023-04-20 MED ORDER — PROPOFOL 1000 MG/100ML IV EMUL
0.0000 ug/kg/min | INTRAVENOUS | Status: DC
Start: 2023-04-21 — End: 2023-04-25
  Administered 2023-04-21: 50 ug/kg/min via INTRAVENOUS
  Administered 2023-04-21 (×4): 45 ug/kg/min via INTRAVENOUS
  Administered 2023-04-21 (×2): 40 ug/kg/min via INTRAVENOUS
  Administered 2023-04-22: 50 ug/kg/min via INTRAVENOUS
  Administered 2023-04-22: 45 ug/kg/min via INTRAVENOUS
  Administered 2023-04-22 (×2): 50 ug/kg/min via INTRAVENOUS
  Administered 2023-04-22 (×2): 45 ug/kg/min via INTRAVENOUS
  Administered 2023-04-22 – 2023-04-24 (×9): 50 ug/kg/min via INTRAVENOUS
  Administered 2023-04-24: 30 ug/kg/min via INTRAVENOUS
  Administered 2023-04-24: 40 ug/kg/min via INTRAVENOUS
  Filled 2023-04-20 (×24): qty 100
  Filled 2023-04-20: qty 200
  Filled 2023-04-20: qty 100

## 2023-04-20 MED ORDER — HEMOSTATIC AGENTS (NO CHARGE) OPTIME
TOPICAL | Status: DC | PRN
Start: 2023-04-20 — End: 2023-04-20
  Administered 2023-04-20: 1 via TOPICAL

## 2023-04-20 SURGICAL SUPPLY — 52 items
BAG COUNTER SPONGE SURGICOUNT (BAG) ×2 IMPLANT
BIOPATCH RED 1 DISK 7.0 (GAUZE/BANDAGES/DRESSINGS) IMPLANT
BLADE CLIPPER SURG (BLADE) IMPLANT
BNDG GAUZE DERMACEA FLUFF 4 (GAUZE/BANDAGES/DRESSINGS) IMPLANT
CANISTER SUCT 3000ML PPV (MISCELLANEOUS) ×2 IMPLANT
COVER SURGICAL LIGHT HANDLE (MISCELLANEOUS) ×2 IMPLANT
DRAIN CHANNEL 19F RND (DRAIN) IMPLANT
DRAPE LAPAROSCOPIC ABDOMINAL (DRAPES) IMPLANT
DRAPE LAPAROTOMY 100X72 PEDS (DRAPES) IMPLANT
DRESSING MEPILEX FLEX 4X4 (GAUZE/BANDAGES/DRESSINGS) IMPLANT
DRSG MEPILEX FLEX 4X4 (GAUZE/BANDAGES/DRESSINGS) ×4 IMPLANT
DRSG OPSITE 11X17.75 LRG (GAUZE/BANDAGES/DRESSINGS) IMPLANT
DRSG OPSITE POSTOP 4X10 (GAUZE/BANDAGES/DRESSINGS) IMPLANT
DRSG TEGADERM 4X4.75 (GAUZE/BANDAGES/DRESSINGS) IMPLANT
DRSG XEROFORM 1X8 (GAUZE/BANDAGES/DRESSINGS) IMPLANT
ELECT BLADE 4.0 EZ CLEAN MEGAD (MISCELLANEOUS) ×2 IMPLANT
ELECT REM PT RETURN 9FT ADLT (ELECTROSURGICAL) ×2 IMPLANT
ELECTRODE BLDE 4.0 EZ CLN MEGD (MISCELLANEOUS) IMPLANT
ELECTRODE REM PT RTRN 9FT ADLT (ELECTROSURGICAL) ×2 IMPLANT
EVACUATOR SILICONE 100CC (DRAIN) IMPLANT
GAUZE 4X4 16PLY ~~LOC~~+RFID DBL (SPONGE) IMPLANT
GAUZE PAD ABD 8X10 STRL (GAUZE/BANDAGES/DRESSINGS) IMPLANT
GAUZE SPONGE 4X4 12PLY STRL (GAUZE/BANDAGES/DRESSINGS) IMPLANT
GLOVE BIOGEL PI IND STRL 7.0 (GLOVE) ×2 IMPLANT
GLOVE SURG SS PI 7.0 STRL IVOR (GLOVE) ×2 IMPLANT
GOWN STRL REUS W/ TWL LRG LVL3 (GOWN DISPOSABLE) ×4 IMPLANT
HANDLE SUCTION POOLE (INSTRUMENTS) IMPLANT
HEMOSTAT NU-KNIT SURGICAL 3X4 (HEMOSTASIS) IMPLANT
KIT BASIN OR (CUSTOM PROCEDURE TRAY) ×2 IMPLANT
KIT TURNOVER KIT B (KITS) ×2 IMPLANT
NDL 22X1.5 STRL (OR ONLY) (MISCELLANEOUS) ×2 IMPLANT
NEEDLE 22X1.5 STRL (OR ONLY) (MISCELLANEOUS) IMPLANT
NS IRRIG 1000ML POUR BTL (IV SOLUTION) ×2 IMPLANT
PACK GENERAL/GYN (CUSTOM PROCEDURE TRAY) ×2 IMPLANT
PACK UNIVERSAL I (CUSTOM PROCEDURE TRAY) IMPLANT
PAD ARMBOARD POSITIONER FOAM (MISCELLANEOUS) ×2 IMPLANT
PENCIL SMOKE EVACUATOR (MISCELLANEOUS) ×2 IMPLANT
SET HNDPC FAN SPRY TIP SCT (DISPOSABLE) IMPLANT
STAPLER SKIN PROX 35W (STAPLE) IMPLANT
SUCTION POOLE HANDLE (INSTRUMENTS) ×2 IMPLANT
SUT CHROMIC 2 0 SH (SUTURE) IMPLANT
SUT ETHILON 2 0 FS 18 (SUTURE) IMPLANT
SUT MON AB 3-0 SH27 (SUTURE) IMPLANT
SUT PDS II 0 TP-1 LOOPED 60 (SUTURE) IMPLANT
SUT VIC AB 2-0 CT1 TAPERPNT 27 (SUTURE) IMPLANT
SUT VIC AB 2-0 SH 27XBRD (SUTURE) IMPLANT
SWAB COLLECTION DEVICE MRSA (MISCELLANEOUS) IMPLANT
SWAB CULTURE ESWAB REG 1ML (MISCELLANEOUS) IMPLANT
SYR TOOMEY IRRIG 70ML (MISCELLANEOUS) ×2 IMPLANT
SYRINGE TOOMEY IRRIG 70ML (MISCELLANEOUS) IMPLANT
TOWEL GREEN STERILE (TOWEL DISPOSABLE) ×2 IMPLANT
TOWEL GREEN STERILE FF (TOWEL DISPOSABLE) ×2 IMPLANT

## 2023-04-20 NOTE — Anesthesia Procedure Notes (Signed)
 Procedure Name: Intubation Date/Time: 04/20/2023 7:20 PM  Performed by: Laruth Bouchard., CRNAPre-anesthesia Checklist: Patient identified, Emergency Drugs available, Suction available, Patient being monitored and Timeout performed Patient Re-evaluated:Patient Re-evaluated prior to induction Oxygen Delivery Method: Circle system utilized Preoxygenation: Pre-oxygenation with 100% oxygen Induction Type: IV induction and Rapid sequence Laryngoscope Size: Glidescope and 3 Grade View: Grade I Tube type: Oral Tube size: 7.5 mm Number of attempts: 1 Airway Equipment and Method: Rigid stylet and Video-laryngoscopy Placement Confirmation: ETT inserted through vocal cords under direct vision, positive ETCO2 and breath sounds checked- equal and bilateral Secured at: 23 cm Tube secured with: Tape Dental Injury: Teeth and Oropharynx as per pre-operative assessment

## 2023-04-20 NOTE — Progress Notes (Signed)
 Transition of Care [facility]) - CAGE-AID Screening   Patient Details  Name: [name] MRN: [id number] Date of Birth: 01/27/1874  Transition of Care [facility]) CM/SW Contact:    Katha Hamming, RN Phone Number: 04/20/2023, 10:35 PM    CAGE-AID Screening: Substance Abuse Screening unable to be completed due to: : Patient unable to participate (intubated/sedated)
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
## 2023-04-20 NOTE — Progress Notes (Signed)
 I spoke with Dr. Jena Gauss about iliac crest fx and soft tissue injury concern for open fracture. He was immediately available and examined the imaging and patient.
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
## 2023-04-20 NOTE — Transfer of Care (Signed)
 Immediate Anesthesia Transfer of Care Note  Patient: [name]  Procedure(s) Performed: IRRIGATION AND DEBRIDEMENT OF PELVIS AND CLOSURE OF HIP WOUND. (Pelvis) LAPAROTOMY, EXPLORATORY AND SPLENIC REPAIR (Abdomen) REPAIR, BLADDER (Abdomen)  Patient Location: ICU  Anesthesia Type:General  Level of Consciousness: sedated and Patient remains intubated per anesthesia plan  Airway & Oxygen Therapy: Patient remains intubated per anesthesia plan and Patient placed on Ventilator (see vital sign flow sheet for setting)  Post-op Assessment: Report given to RN and Post -op Vital signs reviewed and stable  Post vital signs: Reviewed and stable  Last Vitals:  Vitals Value Taken Time  BP 121/69 04/20/23 2200  Temp    Pulse 76 04/20/23 2157  Resp 18 04/20/23 2202  SpO2 100 % 04/20/23 2157  Vitals shown include unfiled device data.  Last Pain: There were no vitals filed for this visit.       Complications: No notable events documented.
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
## 2023-04-20 NOTE — Anesthesia Procedure Notes (Signed)
 Arterial Line Insertion Start/End3/24/2025 7:20 PM, 04/20/2023 7:31 PM Performed by: Edmonia Caprio, CRNA, CRNA  Preanesthetic checklist: patient identified and IV checked Emergency situation Patient sedated Left, radial was placed Catheter size: 20 G Hand hygiene performed  and maximum sterile barriers used   Attempts: 1 Procedure performed without using ultrasound guided technique. Following insertion, dressing applied and Biopatch. Patient tolerated the procedure well with no immediate complications.
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
## 2023-04-21 ENCOUNTER — Encounter (HOSPITAL_COMMUNITY): Payer: Self-pay | Admitting: General Surgery

## 2023-04-21 ENCOUNTER — Inpatient Hospital Stay (HOSPITAL_COMMUNITY): Payer: MEDICAID

## 2023-04-21 ENCOUNTER — Other Ambulatory Visit: Payer: Self-pay

## 2023-04-21 LAB — POCT I-STAT, CHEM 8
BUN: 20 mg/dL (ref 6–20)
Calcium, Ion: 1.08 mmol/L — ABNORMAL LOW (ref 1.15–1.40)
Chloride: 105 mmol/L (ref 98–111)
Creatinine, Ser: 2.5 mg/dL — ABNORMAL HIGH (ref 0.44–1.00)
Glucose, Bld: 120 mg/dL — ABNORMAL HIGH (ref 70–99)
HCT: 31 % — ABNORMAL LOW (ref 36.0–46.0)
Hemoglobin: 10.5 g/dL — ABNORMAL LOW (ref 12.0–15.0)
Potassium: 3.7 mmol/L (ref 3.5–5.1)
Sodium: 139 mmol/L (ref 135–145)
TCO2: 23 mmol/L (ref 22–32)

## 2023-04-21 LAB — COMPREHENSIVE METABOLIC PANEL WITH GFR
ALT: 69 U/L — ABNORMAL HIGH (ref 0–44)
AST: 155 U/L — ABNORMAL HIGH (ref 15–41)
Albumin: 3.5 g/dL (ref 3.5–5.0)
Alkaline Phosphatase: 70 U/L (ref 38–126)
Anion gap: 12 (ref 5–15)
BUN: 20 mg/dL (ref 6–20)
CO2: 22 mmol/L (ref 22–32)
Calcium: 8.5 mg/dL — ABNORMAL LOW (ref 8.9–10.3)
Chloride: 105 mmol/L (ref 98–111)
Creatinine, Ser: 2.2 mg/dL — ABNORMAL HIGH (ref 0.44–1.00)
GFR, Estimated: 14 mL/min — ABNORMAL LOW (ref >60)
Glucose, Bld: 125 mg/dL — ABNORMAL HIGH (ref 70–99)
Potassium: 3.7 mmol/L (ref 3.5–5.1)
Sodium: 139 mmol/L (ref 135–145)
Total Bilirubin: 0.5 mg/dL (ref 0.0–1.2)
Total Protein: 5.8 g/dL — ABNORMAL LOW (ref 6.5–8.1)

## 2023-04-21 LAB — URINALYSIS, ROUTINE W REFLEX MICROSCOPIC
Bilirubin Urine: NEGATIVE
Glucose, UA: NEGATIVE mg/dL
Ketones, ur: NEGATIVE mg/dL
Leukocytes,Ua: NEGATIVE
Nitrite: NEGATIVE
Protein, ur: 30 mg/dL — AB
RBC / HPF: >50 RBC/hpf (ref 0–5)
Specific Gravity, Urine: 1.036 — ABNORMAL HIGH (ref 1.005–1.030)
pH: 5 (ref 5.0–8.0)

## 2023-04-21 LAB — BASIC METABOLIC PANEL WITH GFR
Anion gap: 7 (ref 5–15)
Anion gap: 10 (ref 5–15)
BUN: 13 mg/dL (ref 6–20)
BUN: 16 mg/dL (ref 6–20)
CO2: 17 mmol/L — ABNORMAL LOW (ref 22–32)
CO2: 22 mmol/L (ref 22–32)
Calcium: 6.2 mg/dL — CL (ref 8.9–10.3)
Calcium: 8.1 mg/dL — ABNORMAL LOW (ref 8.9–10.3)
Chloride: 117 mmol/L — ABNORMAL HIGH (ref 98–111)
Chloride: 107 mmol/L (ref 98–111)
Creatinine, Ser: 0.83 mg/dL (ref 0.44–1.00)
Creatinine, Ser: 1.02 mg/dL — ABNORMAL HIGH (ref 0.44–1.00)
GFR, Estimated: 47 mL/min — ABNORMAL LOW (ref >60)
GFR, Estimated: 36 mL/min — ABNORMAL LOW (ref >60)
Glucose, Bld: 127 mg/dL — ABNORMAL HIGH (ref 70–99)
Glucose, Bld: 152 mg/dL — ABNORMAL HIGH (ref 70–99)
Potassium: 3.3 mmol/L — ABNORMAL LOW (ref 3.5–5.1)
Potassium: 4.4 mmol/L (ref 3.5–5.1)
Sodium: 141 mmol/L (ref 135–145)
Sodium: 139 mmol/L (ref 135–145)

## 2023-04-21 LAB — CBC
HCT: 33 % — ABNORMAL LOW (ref 36.0–46.0)
HCT: 31.7 % — ABNORMAL LOW (ref 36.0–46.0)
Hemoglobin: 11 g/dL — ABNORMAL LOW (ref 12.0–15.0)
Hemoglobin: 10.5 g/dL — ABNORMAL LOW (ref 12.0–15.0)
MCH: 27.1 pg (ref 26.0–34.0)
MCH: 26.9 pg (ref 26.0–34.0)
MCHC: 33.3 g/dL (ref 30.0–36.0)
MCHC: 33.1 g/dL (ref 30.0–36.0)
MCV: 81.3 fL (ref 80.0–100.0)
MCV: 81.3 fL (ref 80.0–100.0)
Platelets: 181 10*3/uL (ref 150–400)
Platelets: 200 10*3/uL (ref 150–400)
RBC: 4.06 MIL/uL (ref 3.87–5.11)
RBC: 3.9 MIL/uL (ref 3.87–5.11)
RDW: 15 % (ref 11.5–15.5)
RDW: 15.2 % (ref 11.5–15.5)
WBC: 4.9 10*3/uL (ref 4.0–10.5)
WBC: 4.7 10*3/uL (ref 4.0–10.5)
nRBC: 0 % (ref 0.0–0.2)
nRBC: 0 % (ref 0.0–0.2)

## 2023-04-21 LAB — BPAM RBC
Blood Product Expiration Date: 202504152359
Blood Product Expiration Date: 202504182359
Blood Product Expiration Date: 202504192359
ISSUE DATE / TIME: 202503242000
ISSUE DATE / TIME: 202503242016
ISSUE DATE / TIME: 202503242017
Unit Type and Rh: 6200
Unit Type and Rh: 6200
Unit Type and Rh: 6200

## 2023-04-21 LAB — TYPE AND SCREEN
ABO/RH(D): A POS
Antibody Screen: NEGATIVE
Unit division: 0
Unit division: 0
Unit division: 0

## 2023-04-21 LAB — RAPID URINE DRUG SCREEN, HOSP PERFORMED
Amphetamines: NOT DETECTED
Barbiturates: NOT DETECTED
Benzodiazepines: POSITIVE — AB
Cocaine: POSITIVE — AB
Opiates: NOT DETECTED
Tetrahydrocannabinol: NOT DETECTED

## 2023-04-21 LAB — MRSA NEXT GEN BY PCR, NASAL: MRSA by PCR Next Gen: NOT DETECTED

## 2023-04-21 LAB — TRIGLYCERIDES: Triglycerides: 53 mg/dL (ref <150)

## 2023-04-21 MED ORDER — ORAL CARE MOUTH RINSE
15.0000 mL | OROMUCOSAL | Status: DC
  Administered 2023-04-21 – 2023-04-25 (×47): 15 mL via OROMUCOSAL

## 2023-04-21 MED ORDER — PANTOPRAZOLE SODIUM 40 MG IV SOLR
40.0000 mg | Freq: Every day | INTRAVENOUS | Status: DC
  Administered 2023-04-21 – 2023-04-24 (×4): 40 mg via INTRAVENOUS
  Filled 2023-04-21 (×4): qty 10

## 2023-04-21 MED ORDER — METOPROLOL TARTRATE 5 MG/5ML IV SOLN
5.0000 mg | Freq: Four times a day (QID) | INTRAVENOUS | Status: DC | PRN
  Administered 2023-04-21 – 2023-04-22 (×2): 5 mg via INTRAVENOUS
  Filled 2023-04-21 (×2): qty 5

## 2023-04-21 MED ORDER — ALBUMIN HUMAN 5 % IV SOLN
12.5000 g | Freq: Once | INTRAVENOUS | Status: AC
  Administered 2023-04-21: 12.5 g via INTRAVENOUS
  Filled 2023-04-21: qty 250

## 2023-04-21 MED ORDER — CALCIUM GLUCONATE-NACL 2-0.675 GM/100ML-% IV SOLN
2.0000 g | Freq: Once | INTRAVENOUS | Status: AC
  Administered 2023-04-21: 2000 mg via INTRAVENOUS
  Filled 2023-04-21: qty 100

## 2023-04-21 MED ORDER — ORAL CARE MOUTH RINSE: 15.0000 mL | OROMUCOSAL | Status: DC | PRN

## 2023-04-21 MED ORDER — LACTATED RINGERS IV SOLN: INTRAVENOUS | Status: DC
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
## 2023-04-21 NOTE — Progress Notes (Signed)
 Orthopedic Tech Progress Note Patient Details:  [name] 01/27/1874 [id number]  Ortho Devices Type of Ortho Device: Post (short leg) splint, Stirrup splint Ortho Device/Splint Location: lle Ortho Device/Splint Interventions: Ordered, Application, Adjustment   Post Interventions Patient Tolerated: Well Instructions Provided: Care of device, Adjustment of device  [name] 04/21/2023, 7:13 AM
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
## 2023-04-21 NOTE — Progress Notes (Signed)
 After speaking to Barbarann Ehlers [phone]), the patient was successfully identified as [name]. Trooper Jackelyn Poling mentioned attempting to call next of kin to further identify and notify the family.  Trauma Response nurse, Marchelle Folks, and charge nurse notified. Emergency registration also to be notified in order to appropriately update information.  Mammie Russian, RN

## 2023-04-21 NOTE — Anesthesia Postprocedure Evaluation (Signed)
 Anesthesia Post Note  Patient: [name]  Procedure(s) Performed: IRRIGATION AND DEBRIDEMENT OF PELVIS AND CLOSURE OF HIP WOUND. (Pelvis) LAPAROTOMY, EXPLORATORY AND SPLENIC REPAIR (Abdomen) REPAIR, BLADDER (Abdomen)     Patient location during evaluation: ICU Anesthesia Type: General Level of consciousness: sedated Pain management: pain level controlled Vital Signs Assessment: post-procedure vital signs reviewed and stable Respiratory status: patient remains intubated per anesthesia plan Cardiovascular status: stable Postop Assessment: no apparent nausea or vomiting Anesthetic complications: no   No notable events documented.  Last Vitals:  Vitals:   04/21/23 0542 04/21/23 0600  BP:  132/76  Pulse: 89 87  Resp: (!) 22 (!) 22  Temp:    SpO2: 100% 100%    Last Pain:  Vitals:   04/21/23 0400  TempSrc: Axillary                 Eniyah Eastmond P Ngai Parcell
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
## 2023-04-21 NOTE — Plan of Care (Signed)

## 2023-04-21 NOTE — Progress Notes (Signed)
Transported patient to C.T while patient was on the mechanical ventilator. Patient remained stable during transport.  
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
## 2023-04-21 NOTE — Progress Notes (Signed)
 Orthopaedic Trauma Progress Note  SUBJECTIVE: Intubated and sedated.  No family at bedside  OBJECTIVE:  Vitals:   04/21/23 0743 04/21/23 0800  BP: (!) 165/89   Pulse: (!) 108   Resp:    Temp:  98.3 F (36.8 C)  SpO2:      General: Intubated and sedated Left lower extremity: Dressing clean, dry, intact to the groin.  Short leg splint in place.  Unable to obtain motor or sensory exam Right upper extremity: Long-arm splint in place.  Fingers warm well-perfused.  Unable to obtain motor or sensory exam  IMAGING: X-ray right humerus shows intra-articular fracture of the distal humerus.  CT scan right elbow pending.  X-ray and CT scan left ankle shows trimalleolar ankle fracture  LABS:  Results for orders placed or performed during the hospital encounter of 04/20/23 (from the past 24 hours)  ABO/Rh     Status: None   Collection Time: 04/20/23  6:35 PM  Result Value Ref Range   ABO/RH(D)      A POS Performed at [facility], [address] [place], [place] [address]   Sample to Blood Bank     Status: None   Collection Time: 04/20/23  6:44 PM  Result Value Ref Range   Blood Bank Specimen SAMPLE AVAILABLE FOR TESTING    Sample Expiration      04/23/2023,2359 Performed at [facility], [address], [place], [place] [address]   Type and screen [facility]     Status: None (Preliminary result)   Collection Time: 04/20/23  6:44 PM  Result Value Ref Range   ABO/RH(D) A POS    Antibody Screen NEG    Sample Expiration 04/23/2023,2359    Unit Number [id number]    Blood Component Type RED CELLS,LR    Unit division 00    Status of Unit ISSUED    Transfusion Status OK TO TRANSFUSE    Crossmatch Result Compatible    Unit Number [id number]    Blood Component Type RBC LR PHER1    Unit division 00    Status of Unit ISSUED    Transfusion Status OK TO TRANSFUSE    Crossmatch Result Compatible    Unit Number [id number]    Blood Component Type RED  CELLS,LR    Unit division 00    Status of Unit ISSUED    Transfusion Status OK TO TRANSFUSE    Crossmatch Result      Compatible Performed at [facility], [address] [place], [place] [address]   Comprehensive metabolic panel     Status: Abnormal   Collection Time: 04/20/23  6:46 PM  Result Value Ref Range   Sodium 139 135 - 145 mmol/L   Potassium 3.7 3.5 - 5.1 mmol/L   Chloride 105 98 - 111 mmol/L   CO2 22 22 - 32 mmol/L   Glucose, Bld 125 (H) 70 - 99 mg/dL   BUN 20 8 - 23 mg/dL   Creatinine, Ser 6.43 (H) 0.44 - 1.00 mg/dL   Calcium 8.5 (L) 8.9 - 10.3 mg/dL   Total Protein 5.8 (L) 6.5 - 8.1 g/dL   Albumin 3.5 3.5 - 5.0 g/dL   AST 329 (H) 15 - 41 U/L   ALT 69 (H) 0 - 44 U/L   Alkaline Phosphatase 70 38 - 126 U/L   Total Bilirubin 0.5 0.0 - 1.2 mg/dL   GFR, Estimated 14 (L) >60 mL/min   Anion gap 12  5 - 15  CBC     Status: Abnormal   Collection Time: 04/20/23  6:46 PM  Result Value Ref Range   WBC 8.5 4.0 - 10.5 K/uL   RBC 4.15 3.87 - 5.11 MIL/uL   Hemoglobin 10.6 (L) 12.0 - 15.0 g/dL   HCT 60.4 (L) 54.0 - 98.1 %   MCV 78.8 (L) 80.0 - 100.0 fL   MCH 25.5 (L) 26.0 - 34.0 pg   MCHC 32.4 30.0 - 36.0 g/dL   RDW 19.1 47.8 - 29.5 %   Platelets 301 150 - 400 K/uL   nRBC 0.0 0.0 - 0.2 %  Ethanol     Status: None   Collection Time: 04/20/23  6:46 PM  Result Value Ref Range   Alcohol, Ethyl (B) <10 <10 mg/dL  Protime-INR     Status: None   Collection Time: 04/20/23  6:46 PM  Result Value Ref Range   Prothrombin Time 12.8 11.4 - 15.2 seconds   INR 0.9 0.8 - 1.2  I-Stat Chem 8, ED     Status: Abnormal   Collection Time: 04/20/23  6:48 PM  Result Value Ref Range   Sodium 139 135 - 145 mmol/L   Potassium 3.7 3.5 - 5.1 mmol/L   Chloride 105 98 - 111 mmol/L   BUN 20 8 - 23 mg/dL   Creatinine, Ser 6.21 (H) 0.44 - 1.00 mg/dL   Glucose, Bld 308 (H) 70 - 99 mg/dL   Calcium, Ion 6.57 (L) 1.15 - 1.40 mmol/L   TCO2 23 22 - 32 mmol/L   Hemoglobin 10.5 (L) 12.0 - 15.0  g/dL   HCT 84.6 (L) 96.2 - 95.2 %  I-Stat Lactic Acid, ED     Status: None   Collection Time: 04/20/23  6:49 PM  Result Value Ref Range   Lactic Acid, Venous 1.6 0.5 - 1.9 mmol/L  I-STAT 7, (LYTES, BLD GAS, ICA, H+H)     Status: Abnormal   Collection Time: 04/20/23  7:54 PM  Result Value Ref Range   pH, Arterial 7.275 (L) 7.35 - 7.45   pCO2 arterial 50.8 (H) 32 - 48 mmHg   pO2, Arterial 301 (H) 83 - 108 mmHg   Bicarbonate 23.6 20.0 - 28.0 mmol/L   TCO2 25 22 - 32 mmol/L   O2 Saturation 100 %   Acid-base deficit 3.0 (H) 0.0 - 2.0 mmol/L   Sodium 139 135 - 145 mmol/L   Potassium 3.9 3.5 - 5.1 mmol/L   Calcium, Ion 1.13 (L) 1.15 - 1.40 mmol/L   HCT 19.0 (L) 36.0 - 46.0 %   Hemoglobin 6.5 (LL) 12.0 - 15.0 g/dL   Sample type ARTERIAL   Prepare RBC (crossmatch)     Status: None   Collection Time: 04/20/23  7:56 PM  Result Value Ref Range   Order Confirmation      ORDER PROCESSED BY BLOOD BANK Performed at [facility], [address] [place], [place] [address]   Prepare RBC (crossmatch)     Status: None   Collection Time: 04/20/23  8:17 PM  Result Value Ref Range   Order Confirmation      ORDER PROCESSED BY BLOOD BANK Performed at [facility], [address], [place], [place] [address]   I-STAT 7, (LYTES, BLD GAS, ICA, H+H)     Status: Abnormal   Collection Time: 04/20/23  8:58 PM  Result Value Ref Range   pH, Arterial 7.347 (L) 7.35 - 7.45   pCO2  arterial 38.2 32 - 48 mmHg   pO2, Arterial 267 (H) 83 - 108 mmHg   Bicarbonate 21.0 20.0 - 28.0 mmol/L   TCO2 22 22 - 32 mmol/L   O2 Saturation 100 %   Acid-base deficit 4.0 (H) 0.0 - 2.0 mmol/L   Sodium 139 135 - 145 mmol/L   Potassium 4.5 3.5 - 5.1 mmol/L   Calcium, Ion 1.05 (L) 1.15 - 1.40 mmol/L   HCT 26.0 (L) 36.0 - 46.0 %   Hemoglobin 8.8 (L) 12.0 - 15.0 g/dL   Sample type ARTERIAL   MRSA Next Gen by PCR, Nasal     Status: None   Collection Time: 04/20/23 10:29 PM   Specimen: Nasal Mucosa; Nasal Swab   Result Value Ref Range   MRSA by PCR Next Gen NOT DETECTED NOT DETECTED  I-STAT 7, (LYTES, BLD GAS, ICA, H+H)     Status: Abnormal   Collection Time: 04/20/23 11:03 PM  Result Value Ref Range   pH, Arterial 7.272 (L) 7.35 - 7.45   pCO2 arterial 48.0 32 - 48 mmHg   pO2, Arterial 503 (H) 83 - 108 mmHg   Bicarbonate 22.3 20.0 - 28.0 mmol/L   TCO2 24 22 - 32 mmol/L   O2 Saturation 100 %   Acid-base deficit 5.0 (H) 0.0 - 2.0 mmol/L   Sodium 140 135 - 145 mmol/L   Potassium 3.9 3.5 - 5.1 mmol/L   Calcium, Ion 1.12 (L) 1.15 - 1.40 mmol/L   HCT 30.0 (L) 36.0 - 46.0 %   Hemoglobin 10.2 (L) 12.0 - 15.0 g/dL   Patient temperature 04.5 F    Collection site Web designer by Operator    Sample type ARTERIAL   Urinalysis, Routine w reflex microscopic -Urine, Clean Catch     Status: Abnormal   Collection Time: 04/21/23  5:12 AM  Result Value Ref Range   Color, Urine YELLOW YELLOW   APPearance HAZY (A) CLEAR   Specific Gravity, Urine 1.036 (H) 1.005 - 1.030   pH 5.0 5.0 - 8.0   Glucose, UA NEGATIVE NEGATIVE mg/dL   Hgb urine dipstick MODERATE (A) NEGATIVE   Bilirubin Urine NEGATIVE NEGATIVE   Ketones, ur NEGATIVE NEGATIVE mg/dL   Protein, ur 30 (A) NEGATIVE mg/dL   Nitrite NEGATIVE NEGATIVE   Leukocytes,Ua NEGATIVE NEGATIVE   RBC / HPF >50 0 - 5 RBC/hpf   WBC, UA 0-5 0 - 5 WBC/hpf   Bacteria, UA FEW (A) NONE SEEN   Squamous Epithelial / HPF 0-5 0 - 5 /HPF   Mucus PRESENT   Urine rapid drug screen (hosp performed)     Status: Abnormal   Collection Time: 04/21/23  5:12 AM  Result Value Ref Range   Opiates NONE DETECTED NONE DETECTED   Cocaine POSITIVE (A) NONE DETECTED   Benzodiazepines POSITIVE (A) NONE DETECTED   Amphetamines NONE DETECTED NONE DETECTED   Tetrahydrocannabinol NONE DETECTED NONE DETECTED   Barbiturates NONE DETECTED NONE DETECTED  Basic metabolic panel     Status: Abnormal   Collection Time: 04/21/23  5:12 AM  Result Value Ref Range   Sodium 141 135 - 145  mmol/L   Potassium 3.3 (L) 3.5 - 5.1 mmol/L   Chloride 117 (H) 98 - 111 mmol/L   CO2 17 (L) 22 - 32 mmol/L   Glucose, Bld 127 (H) 70 - 99 mg/dL   BUN 13 8 - 23 mg/dL   Creatinine, Ser 4.09 0.44 - 1.00 mg/dL  Calcium 6.2 (LL) 8.9 - 10.3 mg/dL   GFR, Estimated 47 (L) >60 mL/min   Anion gap 7 5 - 15  Triglycerides     Status: None   Collection Time: 04/21/23  5:12 AM  Result Value Ref Range   Triglycerides 53 <150 mg/dL  CBC     Status: Abnormal   Collection Time: 04/21/23  6:28 AM  Result Value Ref Range   WBC 4.9 4.0 - 10.5 K/uL   RBC 4.06 3.87 - 5.11 MIL/uL   Hemoglobin 11.0 (L) 12.0 - 15.0 g/dL   HCT 14.7 (L) 82.9 - 56.2 %   MCV 81.3 80.0 - 100.0 fL   MCH 27.1 26.0 - 34.0 pg   MCHC 33.3 30.0 - 36.0 g/dL   RDW 13.0 86.5 - 78.4 %   Platelets 181 150 - 400 K/uL   nRBC 0.0 0.0 - 0.2 %  Basic metabolic panel     Status: Abnormal   Collection Time: 04/21/23  6:30 AM  Result Value Ref Range   Sodium 139 135 - 145 mmol/L   Potassium 4.4 3.5 - 5.1 mmol/L   Chloride 107 98 - 111 mmol/L   CO2 22 22 - 32 mmol/L   Glucose, Bld 152 (H) 70 - 99 mg/dL   BUN 16 8 - 23 mg/dL   Creatinine, Ser 6.96 (H) 0.44 - 1.00 mg/dL   Calcium 8.1 (L) 8.9 - 10.3 mg/dL   GFR, Estimated 36 (L) >60 mL/min   Anion gap 10 5 - 15    ASSESSMENT: [name] is a 28 y.o. female, 1 Day Post-Op s/p IRRIGATION AND DEBRIDEMENT OF PELVIS AND CLOSURE OF HIP WOUND. CLOSED REDUCTION AND SPLINTING LEFT ANKLE CLOSED REDUCTION AND SPLINTING RIGHT ELBOW  CV/Blood loss: Hemoglobin 11.0 this morning.  Received multiple blood products perioperatively on 04/20/2023.  Hemodynamically stable  PLAN: Weightbearing: Bedrest ROM: Maintain splints  Incisional and dressing care: Reinforce dressings as needed  Showering: Bed bath Orthopedic device(s): Splint RUE, LLE Pain management: Per trauma team VTE prophylaxis: Hold chemical prophylaxis until cleared by trauma team.  SCDs ID: Ceftriaxone post op per open fracture  protocol Foley/Lines: Foley in place.  KVO IVFs Impediments to Fracture Healing: Polytrauma.  Vitamin D level pending Dispo: Continue care per trauma team.  Tentatively plan to return to OR on Thursday, 04/23/2023 with Dr. Jena Gauss to address left iliac wing, right distal humerus, left ankle  Follow - up plan: To be determined   Contact information:  Truitt Merle MD, Thyra Breed PA-C. After hours and holidays please check [email or web address] for group call information for [facility]   Thompson Caul, PA-C [phone] (office) [email or web address]

## 2023-04-22 ENCOUNTER — Encounter (HOSPITAL_COMMUNITY): Payer: Self-pay

## 2023-04-22 ENCOUNTER — Inpatient Hospital Stay (HOSPITAL_COMMUNITY): Payer: MEDICAID

## 2023-04-22 MED ORDER — LACTATED RINGERS IV SOLN: INTRAVENOUS | Status: AC

## 2023-04-22 MED ORDER — ALBUMIN HUMAN 5 % IV SOLN
12.5000 g | Freq: Once | INTRAVENOUS | Status: AC
  Administered 2023-04-22: 12.5 g via INTRAVENOUS
  Filled 2023-04-22: qty 250

## 2023-04-22 MED ORDER — BUPRENORPHINE HCL 8 MG SL SUBL
8.0000 mg | SUBLINGUAL_TABLET | Freq: Every day | SUBLINGUAL | Status: DC
  Administered 2023-04-22 – 2023-05-01 (×9): 8 mg via SUBLINGUAL
  Filled 2023-04-22: qty 4
  Filled 2023-04-22: qty 1
  Filled 2023-04-22: qty 4
  Filled 2023-04-22: qty 1
  Filled 2023-04-22 (×3): qty 4
  Filled 2023-04-22: qty 1
  Filled 2023-04-22 (×2): qty 4
  Filled 2023-04-22: qty 1

## 2023-04-22 MED ORDER — OXYBUTYNIN CHLORIDE 5 MG/5ML PO SOLN
5.0000 mg | Freq: Three times a day (TID) | ORAL | Status: DC
  Administered 2023-04-22 – 2023-04-25 (×8): 5 mg
  Filled 2023-04-22 (×12): qty 5
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
## 2023-04-22 NOTE — Progress Notes (Signed)
 Patient ID: [name], female   DOB: October 06, 1995, 28 y.o.   MRN: [id number] I spoke with her mother at the bedside and updated her on [name]'s status and the plan of care. 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 I ordered her home dose of Subutex. Other home meds will be after she is extubated. [name] lives with her mother and her mother works nights.  I also discussed her care with Dr. Jena Gauss.  [name], MD, MPH, FACS Please use [email or web address] to contact on call provider

## 2023-04-23 ENCOUNTER — Inpatient Hospital Stay (HOSPITAL_COMMUNITY): Payer: MEDICAID | Admitting: Anesthesiology

## 2023-04-23 ENCOUNTER — Other Ambulatory Visit: Payer: Self-pay

## 2023-04-23 ENCOUNTER — Encounter (HOSPITAL_COMMUNITY): Admission: EM | Discharge status: Against Medical Advice | Payer: Self-pay | Source: Home / Self Care

## 2023-04-23 ENCOUNTER — Inpatient Hospital Stay (HOSPITAL_COMMUNITY): Payer: MEDICAID

## 2023-04-23 DIAGNOSIS — S82892A Other fracture of left lower leg, initial encounter for closed fracture: Secondary | ICD-10-CM

## 2023-04-23 DIAGNOSIS — F418 Other specified anxiety disorders: Secondary | ICD-10-CM

## 2023-04-23 DIAGNOSIS — S42401A Unspecified fracture of lower end of right humerus, initial encounter for closed fracture: Secondary | ICD-10-CM

## 2023-04-23 HISTORY — PX: ORIF ANKLE FRACTURE: SHX5408

## 2023-04-23 HISTORY — PX: ORIF HUMERUS FRACTURE: SHX2126

## 2023-04-23 HISTORY — PX: INCISION AND DRAINAGE OF WOUND: SHX1803

## 2023-04-23 LAB — BASIC METABOLIC PANEL WITH GFR
Anion gap: 9 (ref 5–15)
BUN: 8 mg/dL (ref 6–20)
CO2: 22 mmol/L (ref 22–32)
Calcium: 7.8 mg/dL — ABNORMAL LOW (ref 8.9–10.3)
Chloride: 106 mmol/L (ref 98–111)
Creatinine, Ser: 0.68 mg/dL (ref 0.44–1.00)
GFR, Estimated: >60 mL/min (ref >60)
Glucose, Bld: 83 mg/dL (ref 70–99)
Potassium: 3.7 mmol/L (ref 3.5–5.1)
Sodium: 137 mmol/L (ref 135–145)

## 2023-04-23 LAB — POCT I-STAT 7, (LYTES, BLD GAS, ICA,H+H)
Acid-base deficit: 5 mmol/L — ABNORMAL HIGH (ref 0.0–2.0)
Acid-base deficit: 3 mmol/L — ABNORMAL HIGH (ref 0.0–2.0)
Acid-base deficit: 4 mmol/L — ABNORMAL HIGH (ref 0.0–2.0)
Bicarbonate: 20.1 mmol/L (ref 20.0–28.0)
Bicarbonate: 22.1 mmol/L (ref 20.0–28.0)
Bicarbonate: 21.2 mmol/L (ref 20.0–28.0)
Calcium, Ion: 1.04 mmol/L — ABNORMAL LOW (ref 1.15–1.40)
Calcium, Ion: 1.06 mmol/L — ABNORMAL LOW (ref 1.15–1.40)
Calcium, Ion: 1.04 mmol/L — ABNORMAL LOW (ref 1.15–1.40)
HCT: 22 % — ABNORMAL LOW (ref 36.0–46.0)
HCT: 26 % — ABNORMAL LOW (ref 36.0–46.0)
HCT: 26 % — ABNORMAL LOW (ref 36.0–46.0)
Hemoglobin: 7.5 g/dL — ABNORMAL LOW (ref 12.0–15.0)
Hemoglobin: 8.8 g/dL — ABNORMAL LOW (ref 12.0–15.0)
Hemoglobin: 8.8 g/dL — ABNORMAL LOW (ref 12.0–15.0)
O2 Saturation: 99 %
O2 Saturation: 99 %
O2 Saturation: 98 %
Potassium: 3.5 mmol/L (ref 3.5–5.1)
Potassium: 3.9 mmol/L (ref 3.5–5.1)
Potassium: 4 mmol/L (ref 3.5–5.1)
Sodium: 138 mmol/L (ref 135–145)
Sodium: 136 mmol/L (ref 135–145)
Sodium: 136 mmol/L (ref 135–145)
TCO2: 21 mmol/L — ABNORMAL LOW (ref 22–32)
TCO2: 23 mmol/L (ref 22–32)
TCO2: 22 mmol/L (ref 22–32)
pCO2 arterial: 36.9 mmHg (ref 32–48)
pCO2 arterial: 40 mmHg (ref 32–48)
pCO2 arterial: 40.3 mmHg (ref 32–48)
pH, Arterial: 7.344 — ABNORMAL LOW (ref 7.35–7.45)
pH, Arterial: 7.349 — ABNORMAL LOW (ref 7.35–7.45)
pH, Arterial: 7.33 — ABNORMAL LOW (ref 7.35–7.45)
pO2, Arterial: 139 mmHg — ABNORMAL HIGH (ref 83–108)
pO2, Arterial: 134 mmHg — ABNORMAL HIGH (ref 83–108)
pO2, Arterial: 106 mmHg (ref 83–108)

## 2023-04-23 LAB — CBC
HCT: 25 % — ABNORMAL LOW (ref 36.0–46.0)
Hemoglobin: 8 g/dL — ABNORMAL LOW (ref 12.0–15.0)
MCH: 27 pg (ref 26.0–34.0)
MCHC: 32 g/dL (ref 30.0–36.0)
MCV: 84.5 fL (ref 80.0–100.0)
Platelets: 133 10*3/uL — ABNORMAL LOW (ref 150–400)
RBC: 2.96 MIL/uL — ABNORMAL LOW (ref 3.87–5.11)
RDW: 15.1 % (ref 11.5–15.5)
WBC: 4.3 10*3/uL (ref 4.0–10.5)
nRBC: 0 % (ref 0.0–0.2)

## 2023-04-23 LAB — CREATININE, FLUID (PLEURAL, PERITONEAL, JP DRAINAGE): Creat, Fluid: 0.6 mg/dL

## 2023-04-23 LAB — PREPARE RBC (CROSSMATCH)

## 2023-04-23 SURGERY — OPEN REDUCTION INTERNAL FIXATION (ORIF) ANKLE FRACTURE
Anesthesia: General | Site: Hip | Laterality: Right

## 2023-04-23 MED ORDER — PROPOFOL 10 MG/ML IV BOLUS
INTRAVENOUS | Status: DC | PRN
  Administered 2023-04-23: 50 mg via INTRAVENOUS

## 2023-04-23 MED ORDER — DEXAMETHASONE SODIUM PHOSPHATE 10 MG/ML IJ SOLN
INTRAMUSCULAR | Status: DC | PRN
  Administered 2023-04-23: 4 mg via INTRAVENOUS

## 2023-04-23 MED ORDER — SODIUM CHLORIDE 0.9 % IR SOLN
Status: DC | PRN
  Administered 2023-04-23: 3000 mL

## 2023-04-23 MED ORDER — FENTANYL CITRATE (PF) 250 MCG/5ML IJ SOLN
INTRAMUSCULAR | Status: AC
  Filled 2023-04-23: qty 5

## 2023-04-23 MED ORDER — 0.9 % SODIUM CHLORIDE (POUR BTL) OPTIME
TOPICAL | Status: DC | PRN
  Administered 2023-04-23: 1000 mL

## 2023-04-23 MED ORDER — METOCLOPRAMIDE HCL 5 MG PO TABS: 5.0000 mg | ORAL_TABLET | Freq: Three times a day (TID) | ORAL | Status: DC | PRN

## 2023-04-23 MED ORDER — CEFAZOLIN SODIUM-DEXTROSE 2-3 GM-%(50ML) IV SOLR
INTRAVENOUS | Status: DC | PRN
  Administered 2023-04-23 (×2): 2 g via INTRAVENOUS

## 2023-04-23 MED ORDER — KETAMINE HCL 50 MG/5ML IJ SOSY
PREFILLED_SYRINGE | INTRAMUSCULAR | Status: AC
  Filled 2023-04-23: qty 5

## 2023-04-23 MED ORDER — ALBUMIN HUMAN 5 % IV SOLN: INTRAVENOUS | Status: DC | PRN

## 2023-04-23 MED ORDER — PHENYLEPHRINE 80 MCG/ML (10ML) SYRINGE FOR IV PUSH (FOR BLOOD PRESSURE SUPPORT)
PREFILLED_SYRINGE | INTRAVENOUS | Status: DC | PRN
  Administered 2023-04-23: 160 ug via INTRAVENOUS

## 2023-04-23 MED ORDER — VANCOMYCIN HCL 1000 MG IV SOLR
INTRAVENOUS | Status: DC | PRN
  Administered 2023-04-23 (×3): 1000 mg via TOPICAL

## 2023-04-23 MED ORDER — LACTATED RINGERS IV SOLN: INTRAVENOUS | Status: DC | PRN

## 2023-04-23 MED ORDER — STERILE WATER FOR IRRIGATION IR SOLN
Status: DC | PRN
  Administered 2023-04-23: 1000 mL

## 2023-04-23 MED ORDER — PROPOFOL 10 MG/ML IV BOLUS
INTRAVENOUS | Status: AC
  Filled 2023-04-23: qty 20

## 2023-04-23 MED ORDER — ROCURONIUM BROMIDE 10 MG/ML (PF) SYRINGE
PREFILLED_SYRINGE | INTRAVENOUS | Status: AC
  Filled 2023-04-23: qty 20

## 2023-04-23 MED ORDER — CEFAZOLIN SODIUM-DEXTROSE 2-4 GM/100ML-% IV SOLN
2.0000 g | Freq: Three times a day (TID) | INTRAVENOUS | Status: AC
  Administered 2023-04-23 – 2023-04-24 (×3): 2 g via INTRAVENOUS
  Filled 2023-04-23 (×3): qty 100

## 2023-04-23 MED ORDER — TOBRAMYCIN SULFATE 1.2 G IJ SOLR
INTRAMUSCULAR | Status: DC | PRN
  Administered 2023-04-23: 1.2 g via TOPICAL

## 2023-04-23 MED ORDER — TOBRAMYCIN SULFATE 1.2 G IJ SOLR
INTRAMUSCULAR | Status: AC
  Filled 2023-04-23: qty 1.2

## 2023-04-23 MED ORDER — FENTANYL CITRATE (PF) 250 MCG/5ML IJ SOLN
INTRAMUSCULAR | Status: DC | PRN
  Administered 2023-04-23 (×2): 50 ug via INTRAVENOUS

## 2023-04-23 MED ORDER — METOCLOPRAMIDE HCL 5 MG/ML IJ SOLN: 5.0000 mg | Freq: Three times a day (TID) | INTRAMUSCULAR | Status: DC | PRN

## 2023-04-23 MED ORDER — SODIUM CHLORIDE 0.9 % IV SOLN: 10.0000 mL/h | Freq: Once | INTRAVENOUS | Status: DC

## 2023-04-23 MED ORDER — CEFAZOLIN SODIUM 1 G IJ SOLR
INTRAMUSCULAR | Status: AC
  Filled 2023-04-23: qty 20

## 2023-04-23 MED ORDER — VANCOMYCIN HCL 1000 MG IV SOLR
INTRAVENOUS | Status: AC
  Filled 2023-04-23: qty 60

## 2023-04-23 MED ORDER — PHENYLEPHRINE HCL-NACL 20-0.9 MG/250ML-% IV SOLN
INTRAVENOUS | Status: DC | PRN
  Administered 2023-04-23: 30 ug/min via INTRAVENOUS

## 2023-04-23 MED ORDER — ONDANSETRON HCL 4 MG/2ML IJ SOLN
INTRAMUSCULAR | Status: AC
  Filled 2023-04-23: qty 2

## 2023-04-23 MED ORDER — DEXAMETHASONE SODIUM PHOSPHATE 10 MG/ML IJ SOLN
INTRAMUSCULAR | Status: AC
  Filled 2023-04-23: qty 1

## 2023-04-23 MED ORDER — PHENYLEPHRINE HCL-NACL 20-0.9 MG/250ML-% IV SOLN
INTRAVENOUS | Status: AC
  Filled 2023-04-23: qty 250

## 2023-04-23 MED ORDER — KETAMINE HCL 10 MG/ML IJ SOLN
INTRAMUSCULAR | Status: DC | PRN
Start: 2023-04-23 — End: 2023-04-23
  Administered 2023-04-23: 50 mg via INTRAVENOUS

## 2023-04-23 MED ORDER — ROCURONIUM BROMIDE 10 MG/ML (PF) SYRINGE
PREFILLED_SYRINGE | INTRAVENOUS | Status: DC | PRN
Start: 2023-04-23 — End: 2023-04-23
  Administered 2023-04-23: 10 mg via INTRAVENOUS
  Administered 2023-04-23: 40 mg via INTRAVENOUS
  Administered 2023-04-23: 10 mg via INTRAVENOUS
  Administered 2023-04-23 (×2): 20 mg via INTRAVENOUS
  Administered 2023-04-23 (×2): 40 mg via INTRAVENOUS

## 2023-04-23 SURGICAL SUPPLY — 119 items
3.2 guide wire ×3 IMPLANT
6.5 canullated screw 110mm (Screw) ×3 IMPLANT
BAG COUNTER SPONGE SURGICOUNT (BAG) ×3 IMPLANT
BANDAGE ESMARK 6X9 LF (GAUZE/BANDAGES/DRESSINGS) ×3 IMPLANT
BIT DRILL 3.2 QUICK MINI 300 (DRILL) IMPLANT
BIT DRILL 5.0 QC 6.5 (BIT) IMPLANT
BIT DRILL QC 2.0 SHORT EVOS SM (DRILL) IMPLANT
BIT DRILL QC 2.5MM SHRT EVO SM (DRILL) IMPLANT
BLADE AVERAGE 25X9 (BLADE) IMPLANT
BLADE LONG MED 31X9 (MISCELLANEOUS) IMPLANT
BNDG COHESIVE 4X5 TAN STRL LF (GAUZE/BANDAGES/DRESSINGS) ×3 IMPLANT
BNDG ELASTIC 4INX 5YD STR LF (GAUZE/BANDAGES/DRESSINGS) IMPLANT
BNDG ELASTIC 4X5.8 VLCR STR LF (GAUZE/BANDAGES/DRESSINGS) IMPLANT
BNDG ELASTIC 6INX 5YD STR LF (GAUZE/BANDAGES/DRESSINGS) IMPLANT
BNDG ESMARK 4X9 LF (GAUZE/BANDAGES/DRESSINGS) IMPLANT
BNDG ESMARK 6X9 LF (GAUZE/BANDAGES/DRESSINGS) IMPLANT
BNDG GAUZE DERMACEA FLUFF 4 (GAUZE/BANDAGES/DRESSINGS) ×6 IMPLANT
BRUSH SCRUB EZ PLAIN DRY (MISCELLANEOUS) ×6 IMPLANT
CHLORAPREP W/TINT 26 (MISCELLANEOUS) ×3 IMPLANT
CLEANER TIP ELECTROSURG 2X2 (MISCELLANEOUS) ×3 IMPLANT
CORD BIPOLAR FORCEPS 12FT (ELECTRODE) ×3 IMPLANT
COVER SURGICAL LIGHT HANDLE (MISCELLANEOUS) ×3 IMPLANT
DRAIN PENROSE 12X.25 LTX STRL (MISCELLANEOUS) IMPLANT
DRAPE C-ARM 42X72 X-RAY (DRAPES) ×3 IMPLANT
DRAPE C-ARMOR (DRAPES) ×3 IMPLANT
DRAPE HALF SHEET 40X57 (DRAPES) ×3 IMPLANT
DRAPE INCISE IOBAN 66X45 STRL (DRAPES) IMPLANT
DRAPE SURG ORHT 6 SPLT 77X108 (DRAPES) ×6 IMPLANT
DRAPE U-SHAPE 47X51 STRL (DRAPES) ×3 IMPLANT
DRILL QC 2.0 SHORT EVOS SM (DRILL) ×6 IMPLANT
DRILL QC 2.5MM SHORT EVOS SM (DRILL) ×6 IMPLANT
DRSG ADAPTIC 3X8 NADH LF (GAUZE/BANDAGES/DRESSINGS) ×3 IMPLANT
DRSG AQUACEL AG ADV 3.5X14 (GAUZE/BANDAGES/DRESSINGS) IMPLANT
DRSG MEPITEL 4X7.2 (GAUZE/BANDAGES/DRESSINGS) IMPLANT
ELECT REM PT RETURN 9FT ADLT (ELECTROSURGICAL) ×3 IMPLANT
ELECTRODE REM PT RTRN 9FT ADLT (ELECTROSURGICAL) ×3 IMPLANT
EVACUATOR 1/8 PVC DRAIN (DRAIN) IMPLANT
GAUZE PAD ABD 8X10 STRL (GAUZE/BANDAGES/DRESSINGS) ×3 IMPLANT
GAUZE SPONGE 4X4 12PLY STRL (GAUZE/BANDAGES/DRESSINGS) ×6 IMPLANT
GLOVE BIO SURGEON STRL SZ 6.5 (GLOVE) ×9 IMPLANT
GLOVE BIO SURGEON STRL SZ7.5 (GLOVE) ×9 IMPLANT
GLOVE BIOGEL PI IND STRL 6.5 (GLOVE) ×3 IMPLANT
GLOVE BIOGEL PI IND STRL 7.5 (GLOVE) ×3 IMPLANT
GOWN STRL REUS W/ TWL LRG LVL3 (GOWN DISPOSABLE) ×9 IMPLANT
GOWN STRL REUS W/ TWL XL LVL3 (GOWN DISPOSABLE) ×3 IMPLANT
GUIDEWIRE 3.2 THRD (WIRE) ×3 IMPLANT
K-WIRE FX150X1.6XTROC PNT (WIRE) ×27 IMPLANT
KIT BASIN OR (CUSTOM PROCEDURE TRAY) ×3 IMPLANT
KIT TURNOVER KIT B (KITS) ×3 IMPLANT
KWIRE FX150X1.6XTROC PNT (WIRE) IMPLANT
LOOP VASCLR MAXI BLUE 18IN ST (MISCELLANEOUS) ×3 IMPLANT
LOOPS VASCLR MAXI BLUE 18IN ST (MISCELLANEOUS) ×3 IMPLANT
MANIFOLD NEPTUNE II (INSTRUMENTS) ×3 IMPLANT
NDL HYPO 21X1.5 SAFETY (NEEDLE) IMPLANT
NDL HYPO 25GX1X1/2 BEV (NEEDLE) ×3 IMPLANT
NDL HYPO 25X1 1.5 SAFETY (NEEDLE) IMPLANT
NEEDLE HYPO 21X1.5 SAFETY (NEEDLE) IMPLANT
NEEDLE HYPO 25GX1X1/2 BEV (NEEDLE) IMPLANT
NEEDLE HYPO 25X1 1.5 SAFETY (NEEDLE) IMPLANT
NS IRRIG 1000ML POUR BTL (IV SOLUTION) ×3 IMPLANT
PACK ORTHO EXTREMITY (CUSTOM PROCEDURE TRAY) ×3 IMPLANT
PACK TOTAL JOINT (CUSTOM PROCEDURE TRAY) ×3 IMPLANT
PAD ARMBOARD POSITIONER FOAM (MISCELLANEOUS) ×6 IMPLANT
PAD CAST 4YDX4 CTTN HI CHSV (CAST SUPPLIES) IMPLANT
PADDING CAST COTTON 6X4 STRL (CAST SUPPLIES) IMPLANT
PADDING CAST SYNTHETIC 4X4 STR (CAST SUPPLIES) IMPLANT
PLATE 5H L 81MM FIBULA EVOS (Plate) IMPLANT
PLATE HUM EVOS 2.7X80 3H (Plate) IMPLANT
PLATE HUM EVOS 6H R 2.7X85 (Plate) IMPLANT
PLATE TUBULAR 10H 3.5 (Plate) IMPLANT
SCREW CANN 6.5X110 (Screw) IMPLANT
SCREW CORT 2.7X16 ST EVOS (Screw) IMPLANT
SCREW CORT 2.7X16 STAR T8 EVOS (Screw) IMPLANT
SCREW CORT 2.7X17 T8 ST EVOS (Screw) IMPLANT
SCREW CORT 2.7X18 T8 ST EVOS (Screw) IMPLANT
SCREW CORT 3.5X11 ST EVOS (Screw) IMPLANT
SCREW CORT 3.5X20 ST EVOS (Screw) IMPLANT
SCREW CORT 3.5X22 ST EVOS (Screw) IMPLANT
SCREW CORT EVOS ST 3.5X12 (Screw) IMPLANT
SCREW CORT ST EVOS 2.7X14 (Screw) IMPLANT
SCREW CORT ST EVOS 2.7X46 (Screw) IMPLANT
SCREW CORT ST EVOS 2.7X55 (Screw) IMPLANT
SCREW CORT ST EVOS 3.5X46 (Screw) IMPLANT
SCREW CORT ST EVOS 3.5X60 (Screw) IMPLANT
SCREW CORT ST EVOS 3.5X65 (Screw) IMPLANT
SCREW CORTEX 3.5X24MM (Screw) IMPLANT
SCREW CTX 3.5X50MM EVOS (Screw) IMPLANT
SCREW EVOS 2.7X18 LOCK T8 (Screw) IMPLANT
SCREW LOCK 3.5X36MM EVOS (Screw) IMPLANT
SCREW LOCK ST EVOS 2.7X20 (Screw) IMPLANT
SCREW LOCK ST EVOS 2.7X34 (Screw) IMPLANT
SCREW LOCK ST EVOS 2.7X36 (Screw) IMPLANT
SCREW LOCK ST EVOS 3.5 X 55 (Screw) IMPLANT
SCREW LOCKING 3.5X75 (Screw) IMPLANT
SPLINT PLASTER CAST XFAST 5X30 (CAST SUPPLIES) IMPLANT
SPONGE T-LAP 18X18 ~~LOC~~+RFID (SPONGE) IMPLANT
STAPLER SKIN PROX 35W (STAPLE) IMPLANT
STAPLER VISISTAT 35W (STAPLE) ×3 IMPLANT
STOCKINETTE IMPERVIOUS 9X36 MD (GAUZE/BANDAGES/DRESSINGS) ×3 IMPLANT
SUCTION TUBE FRAZIER 10FR DISP (SUCTIONS) ×3 IMPLANT
SUCTION TUBE FRAZIER 8FR DISP (SUCTIONS) IMPLANT
SUT ETHILON 3 0 PS 1 (SUTURE) ×6 IMPLANT
SUT MNCRL AB 3-0 PS2 27 (SUTURE) IMPLANT
SUT MON AB 2-0 CT1 36 (SUTURE) IMPLANT
SUT MON AB-0 CT1 36 (SUTURE) IMPLANT
SUT PROLENE 0 CT (SUTURE) IMPLANT
SUT VIC AB 0 CT1 27XBRD ANBCTR (SUTURE) ×6 IMPLANT
SUT VIC AB 2-0 CT1 TAPERPNT 27 (SUTURE) ×6 IMPLANT
SYR 5ML LL (SYRINGE) IMPLANT
SYR CONTROL 10ML LL (SYRINGE) ×3 IMPLANT
TIE VASCULAR MAXI BLUE 18IN ST (MISCELLANEOUS) ×3 IMPLANT
TIP HIGH FLOW IRRIGATION COAX (MISCELLANEOUS) IMPLANT
TOWEL GREEN STERILE (TOWEL DISPOSABLE) ×6 IMPLANT
TOWEL GREEN STERILE FF (TOWEL DISPOSABLE) ×3 IMPLANT
TRAY FOLEY MTR SLVR 16FR STAT (SET/KITS/TRAYS/PACK) IMPLANT
UNDERPAD 30X36 HEAVY ABSORB (UNDERPADS AND DIAPERS) ×3 IMPLANT
WASHER CANN 12.7 STRL (Washer) ×6 IMPLANT
WATER STERILE IRR 1000ML POUR (IV SOLUTION) ×3 IMPLANT
YANKAUER SUCT BULB TIP NO VENT (SUCTIONS) IMPLANT
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
## 2023-04-23 NOTE — Plan of Care (Signed)
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
## 2023-04-23 NOTE — Transfer of Care (Signed)
 Immediate Anesthesia Transfer of Care Note  Patient: [name]  Procedure(s) Performed: OPEN REDUCTION INTERNAL FIXATION (ORIF) ANKLE FRACTURE (Left: Ankle) OPEN REDUCTION INTERNAL FIXATION (ORIF) DISTAL HUMERUS FRACTURE (Right: Arm Upper) IRRIGATION AND DEBRIDEMENT AND FIXATION OF PELVIC WOUND (Left: Hip)  Patient Location: ICU  Anesthesia Type:General  Level of Consciousness: Patient remains intubated per anesthesia plan  Airway & Oxygen Therapy: Patient remains intubated per anesthesia plan  Post-op Assessment: Report given to RN and Post -op Vital signs reviewed and stable  Post vital signs: Reviewed and stable  Last Vitals:  Vitals Value Taken Time  BP    Temp    Pulse 90 04/23/23 1637  Resp 23 04/23/23 1637  SpO2 96 % 04/23/23 1637  Vitals shown include unfiled device data.  Last Pain:  Vitals:   04/23/23 0800  TempSrc: Axillary  PainSc:          Complications: No notable events documented.
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
## 2023-04-23 NOTE — Anesthesia Procedure Notes (Signed)
 Arterial Line Insertion Start/End3/27/2025 11:40 AM, 04/23/2023 11:45 AM Performed by: Lannie Fields, DO, anesthesiologist  Patient location: Pre-op. Preanesthetic checklist: patient identified, IV checked, site marked, risks and benefits discussed, surgical consent, monitors and equipment checked, pre-op evaluation, timeout performed and anesthesia consent Lidocaine 1% used for infiltration Left, radial was placed Catheter size: 20 G Hand hygiene performed  and maximum sterile barriers used   Attempts: 1 Procedure performed using ultrasound guided technique. Following insertion, dressing applied. Post procedure assessment: normal and unchanged  Patient tolerated the procedure well with no immediate complications.

## 2023-04-24 ENCOUNTER — Encounter (HOSPITAL_COMMUNITY): Payer: Self-pay | Admitting: Student

## 2023-04-24 ENCOUNTER — Inpatient Hospital Stay (HOSPITAL_COMMUNITY): Payer: MEDICAID

## 2023-04-24 LAB — BASIC METABOLIC PANEL WITH GFR
Anion gap: 12 (ref 5–15)
BUN: 13 mg/dL (ref 6–20)
CO2: 20 mmol/L — ABNORMAL LOW (ref 22–32)
Calcium: 7.4 mg/dL — ABNORMAL LOW (ref 8.9–10.3)
Chloride: 105 mmol/L (ref 98–111)
Creatinine, Ser: 0.56 mg/dL (ref 0.44–1.00)
GFR, Estimated: >60 mL/min (ref >60)
Glucose, Bld: 95 mg/dL (ref 70–99)
Potassium: 4 mmol/L (ref 3.5–5.1)
Sodium: 137 mmol/L (ref 135–145)

## 2023-04-24 LAB — CBC
HCT: 24.1 % — ABNORMAL LOW (ref 36.0–46.0)
Hemoglobin: 8 g/dL — ABNORMAL LOW (ref 12.0–15.0)
MCH: 27.6 pg (ref 26.0–34.0)
MCHC: 33.2 g/dL (ref 30.0–36.0)
MCV: 83.1 fL (ref 80.0–100.0)
Platelets: 106 10*3/uL — ABNORMAL LOW (ref 150–400)
RBC: 2.9 MIL/uL — ABNORMAL LOW (ref 3.87–5.11)
RDW: 15 % (ref 11.5–15.5)
WBC: 3.2 10*3/uL — ABNORMAL LOW (ref 4.0–10.5)
nRBC: 0.6 % — ABNORMAL HIGH (ref 0.0–0.2)

## 2023-04-24 LAB — BPAM RBC
Blood Product Expiration Date: 202504202359
Blood Product Expiration Date: 202504202359
ISSUE DATE / TIME: 202503271210
ISSUE DATE / TIME: 202503271210
Unit Type and Rh: 6200
Unit Type and Rh: 6200

## 2023-04-24 LAB — GLUCOSE, CAPILLARY
Glucose-Capillary: 103 mg/dL — ABNORMAL HIGH (ref 70–99)
Glucose-Capillary: 103 mg/dL — ABNORMAL HIGH (ref 70–99)
Glucose-Capillary: 108 mg/dL — ABNORMAL HIGH (ref 70–99)
Glucose-Capillary: 110 mg/dL — ABNORMAL HIGH (ref 70–99)

## 2023-04-24 LAB — TYPE AND SCREEN
ABO/RH(D): A POS
Antibody Screen: NEGATIVE
Unit division: 0
Unit division: 0

## 2023-04-24 LAB — MAGNESIUM: Magnesium: 2.1 mg/dL (ref 1.7–2.4)

## 2023-04-24 LAB — PHOSPHORUS: Phosphorus: 2 mg/dL — ABNORMAL LOW (ref 2.5–4.6)

## 2023-04-24 LAB — TRIGLYCERIDES: Triglycerides: 191 mg/dL — ABNORMAL HIGH (ref <150)

## 2023-04-24 MED ORDER — OXYCODONE HCL 5 MG PO TABS
10.0000 mg | ORAL_TABLET | Freq: Four times a day (QID) | ORAL | Status: DC
  Administered 2023-04-24 – 2023-04-25 (×5): 10 mg
  Filled 2023-04-24 (×5): qty 2

## 2023-04-24 MED ORDER — CALCIUM GLUCONATE-NACL 1-0.675 GM/50ML-% IV SOLN
1.0000 g | Freq: Once | INTRAVENOUS | Status: AC
  Administered 2023-04-24: 1000 mg via INTRAVENOUS
  Filled 2023-04-24: qty 50

## 2023-04-24 MED ORDER — DEXMEDETOMIDINE HCL IN NACL 400 MCG/100ML IV SOLN
INTRAVENOUS | Status: AC
  Administered 2023-04-24: 0.4 ug/kg/h via INTRAVENOUS
  Filled 2023-04-24: qty 100

## 2023-04-24 MED ORDER — PROSOURCE TF20 ENFIT COMPATIBL EN LIQD
60.0000 mL | Freq: Every day | ENTERAL | Status: DC
  Administered 2023-04-24: 60 mL
  Filled 2023-04-24: qty 60

## 2023-04-24 MED ORDER — QUETIAPINE FUMARATE 25 MG PO TABS
50.0000 mg | ORAL_TABLET | Freq: Two times a day (BID) | ORAL | Status: DC
  Administered 2023-04-24 – 2023-04-25 (×3): 50 mg
  Filled 2023-04-24 (×3): qty 2

## 2023-04-24 MED ORDER — VITAL 1.5 CAL PO LIQD
1000.0000 mL | ORAL | Status: DC
  Administered 2023-04-24: 1000 mL

## 2023-04-24 MED ORDER — IPRATROPIUM-ALBUTEROL 0.5-2.5 (3) MG/3ML IN SOLN
3.0000 mL | RESPIRATORY_TRACT | Status: DC | PRN
  Administered 2023-04-24 – 2023-04-26 (×4): 3 mL via RESPIRATORY_TRACT
  Filled 2023-04-24 (×4): qty 3

## 2023-04-24 MED ORDER — OXYCODONE HCL 5 MG PO TABS
5.0000 mg | ORAL_TABLET | ORAL | Status: DC | PRN
  Administered 2023-04-24: 10 mg
  Filled 2023-04-24 (×2): qty 2

## 2023-04-24 MED ORDER — SENNA 8.6 MG PO TABS
1.0000 | ORAL_TABLET | Freq: Two times a day (BID) | ORAL | Status: DC
  Administered 2023-04-24 – 2023-04-25 (×3): 8.6 mg
  Filled 2023-04-24 (×3): qty 1

## 2023-04-24 MED ORDER — CLONAZEPAM 0.5 MG PO TABS
0.5000 mg | ORAL_TABLET | Freq: Two times a day (BID) | ORAL | Status: DC
  Administered 2023-04-24 – 2023-04-25 (×3): 0.5 mg
  Filled 2023-04-24 (×3): qty 1

## 2023-04-24 MED ORDER — THIAMINE MONONITRATE 100 MG PO TABS
100.0000 mg | ORAL_TABLET | Freq: Every day | ORAL | Status: DC
  Administered 2023-04-24 – 2023-04-25 (×2): 100 mg
  Filled 2023-04-24 (×2): qty 1

## 2023-04-24 MED ORDER — VITAL HIGH PROTEIN PO LIQD: 1000.0000 mL | ORAL | Status: DC

## 2023-04-24 MED ORDER — PROSOURCE TF20 ENFIT COMPATIBL EN LIQD
60.0000 mL | Freq: Two times a day (BID) | ENTERAL | Status: DC
  Administered 2023-04-24 – 2023-04-25 (×2): 60 mL
  Filled 2023-04-24 (×2): qty 60

## 2023-04-24 MED ORDER — DEXMEDETOMIDINE HCL IN NACL 400 MCG/100ML IV SOLN
0.0000 ug/kg/h | INTRAVENOUS | Status: DC
  Administered 2023-04-24 (×2): 0.8 ug/kg/h via INTRAVENOUS
  Administered 2023-04-24 – 2023-04-25 (×3): 1.2 ug/kg/h via INTRAVENOUS
  Administered 2023-04-25: 0.6 ug/kg/h via INTRAVENOUS
  Administered 2023-04-25 (×2): 1.2 ug/kg/h via INTRAVENOUS
  Administered 2023-04-25 – 2023-04-26 (×2): 0.9 ug/kg/h via INTRAVENOUS
  Administered 2023-04-26: 0.8 ug/kg/h via INTRAVENOUS
  Administered 2023-04-26: 0.9 ug/kg/h via INTRAVENOUS
  Administered 2023-04-26: 0.6 ug/kg/h via INTRAVENOUS
  Administered 2023-04-26: 0.9 ug/kg/h via INTRAVENOUS
  Administered 2023-04-27: 0.4 ug/kg/h via INTRAVENOUS
  Administered 2023-04-27: 0.6 ug/kg/h via INTRAVENOUS
  Administered 2023-04-28: 0.3 ug/kg/h via INTRAVENOUS
  Filled 2023-04-24 (×20): qty 100

## 2023-04-24 MED ORDER — ADULT MULTIVITAMIN W/MINERALS CH
1.0000 | ORAL_TABLET | Freq: Every day | ORAL | Status: DC
  Administered 2023-04-24 – 2023-04-25 (×2): 1
  Filled 2023-04-24 (×2): qty 1
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
## 2023-04-24 NOTE — Progress Notes (Signed)
..  Trauma Event Note    Called by primary RN Fleet Contras, pt very agitated, difficult to sedate despite multiple boluses of available medications. Pt notably agitated, pt does have polysubstance hx, discussed with Dr. Azucena Cecil, order for Precedex given and primary RN updated.   Last imported Vital Signs BP (!) 103/56   Pulse 95   Temp 99.6 F (37.6 C) (Axillary)   Resp (!) 23   Ht 5\' 6"  (1.676 m)   Wt 264 lb 8.8 oz (120 kg)   LMP  (LMP Unknown)   SpO2 98%   BMI 42.70 kg/m   Trending CBC Recent Labs    04/21/23 0628 04/21/23 1443 04/23/23 0920 04/23/23 1312 04/23/23 1409 04/23/23 1554  WBC 4.9 4.7 4.3  --   --   --   HGB 11.0* 10.5* 8.0* 7.5* 8.8* 8.8*  HCT 33.0* 31.7* 25.0* 22.0* 26.0* 26.0*  PLT 181 200 133*  --   --   --     Trending Coag's No results for input(s): "APTT", "INR" in the last 72 hours.  Trending BMET Recent Labs    04/21/23 0630 04/23/23 0920 04/23/23 1312 04/23/23 1409 04/23/23 1554  NA 139 137 138 136 136  K 4.4 3.7 3.5 3.9 4.0  CL 107 106  --   --   --   CO2 22 22  --   --   --   BUN 16 8  --   --   --   CREATININE 1.02* 0.68  --   --   --   GLUCOSE 152* 83  --   --   --       [name]  Trauma Response RN  Please call TRN at [phone] for further assistance.

## 2023-04-24 NOTE — Anesthesia Postprocedure Evaluation (Signed)
 Anesthesia Post Note  Patient: [name]  Procedure(s) Performed: OPEN REDUCTION INTERNAL FIXATION (ORIF) ANKLE FRACTURE (Left: Ankle) OPEN REDUCTION INTERNAL FIXATION (ORIF) DISTAL HUMERUS FRACTURE (Right: Arm Upper) IRRIGATION AND DEBRIDEMENT AND FIXATION OF PELVIC WOUND (Left: Hip)     Patient location during evaluation: ICU Anesthesia Type: General Level of consciousness: sedated Pain management: pain level controlled Vital Signs Assessment: post-procedure vital signs reviewed and stable Respiratory status: patient remains intubated per anesthesia plan Cardiovascular status: stable Postop Assessment: no apparent nausea or vomiting Anesthetic complications: no   No notable events documented.  Last Vitals:  Vitals:   04/24/23 0600 04/24/23 0700  BP: (!) 101/55 (!) 95/48  Pulse: 98 91  Resp: (!) 21 (!) 22  Temp:    SpO2: 100% 100%    Last Pain:  Vitals:   04/24/23 0400  TempSrc: Axillary  PainSc:                  [name]

## 2023-04-24 NOTE — Progress Notes (Signed)
  Inpatient Rehab Admissions Coordinator :  Per therapy recommendations patient was screened for CIR candidacy by Ottie Glazier RN MSN. Patient is not at a level to tolerate the intensity required to pursue a CIR admit . The CIR admissions team will follow and monitor for progress and place a Rehab Consult order if felt to be appropriate. Please contact me with any questions.  Ottie Glazier RN MSN Admissions Coordinator [phone]
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
## 2023-04-25 ENCOUNTER — Inpatient Hospital Stay (HOSPITAL_COMMUNITY): Payer: MEDICAID

## 2023-04-25 LAB — BASIC METABOLIC PANEL WITH GFR
Anion gap: 4 — ABNORMAL LOW (ref 5–15)
BUN: 20 mg/dL (ref 6–20)
CO2: 27 mmol/L (ref 22–32)
Calcium: 7.8 mg/dL — ABNORMAL LOW (ref 8.9–10.3)
Chloride: 107 mmol/L (ref 98–111)
Creatinine, Ser: 0.51 mg/dL (ref 0.44–1.00)
GFR, Estimated: >60 mL/min (ref >60)
Glucose, Bld: 115 mg/dL — ABNORMAL HIGH (ref 70–99)
Potassium: 3.3 mmol/L — ABNORMAL LOW (ref 3.5–5.1)
Sodium: 138 mmol/L (ref 135–145)

## 2023-04-25 LAB — PHOSPHORUS
Phosphorus: 1.7 mg/dL — ABNORMAL LOW (ref 2.5–4.6)
Phosphorus: 3.7 mg/dL (ref 2.5–4.6)

## 2023-04-25 LAB — CBC
HCT: 22.3 % — ABNORMAL LOW (ref 36.0–46.0)
Hemoglobin: 7.2 g/dL — ABNORMAL LOW (ref 12.0–15.0)
MCH: 27.5 pg (ref 26.0–34.0)
MCHC: 32.3 g/dL (ref 30.0–36.0)
MCV: 85.1 fL (ref 80.0–100.0)
Platelets: 116 10*3/uL — ABNORMAL LOW (ref 150–400)
RBC: 2.62 MIL/uL — ABNORMAL LOW (ref 3.87–5.11)
RDW: 15.3 % (ref 11.5–15.5)
WBC: 3.5 10*3/uL — ABNORMAL LOW (ref 4.0–10.5)
nRBC: 0.9 % — ABNORMAL HIGH (ref 0.0–0.2)

## 2023-04-25 LAB — GLUCOSE, CAPILLARY
Glucose-Capillary: 92 mg/dL (ref 70–99)
Glucose-Capillary: 120 mg/dL — ABNORMAL HIGH (ref 70–99)
Glucose-Capillary: 131 mg/dL — ABNORMAL HIGH (ref 70–99)
Glucose-Capillary: 115 mg/dL — ABNORMAL HIGH (ref 70–99)
Glucose-Capillary: 97 mg/dL (ref 70–99)
Glucose-Capillary: 106 mg/dL — ABNORMAL HIGH (ref 70–99)

## 2023-04-25 LAB — MAGNESIUM
Magnesium: 2.1 mg/dL (ref 1.7–2.4)
Magnesium: 2.1 mg/dL (ref 1.7–2.4)

## 2023-04-25 MED ORDER — QUETIAPINE FUMARATE 25 MG PO TABS
50.0000 mg | ORAL_TABLET | Freq: Two times a day (BID) | ORAL | Status: DC
  Administered 2023-04-25 – 2023-04-26 (×3): 50 mg via ORAL
  Filled 2023-04-25 (×4): qty 2

## 2023-04-25 MED ORDER — ADULT MULTIVITAMIN W/MINERALS CH
1.0000 | ORAL_TABLET | Freq: Every day | ORAL | Status: DC
  Administered 2023-04-26 – 2023-05-01 (×6): 1 via ORAL
  Filled 2023-04-25 (×6): qty 1

## 2023-04-25 MED ORDER — RACEPINEPHRINE HCL 2.25 % IN NEBU
0.5000 mL | INHALATION_SOLUTION | RESPIRATORY_TRACT | Status: DC | PRN
  Administered 2023-04-25 – 2023-04-26 (×2): 0.5 mL via RESPIRATORY_TRACT
  Filled 2023-04-25 (×2): qty 0.5

## 2023-04-25 MED ORDER — PANTOPRAZOLE SODIUM 40 MG PO TBEC
40.0000 mg | DELAYED_RELEASE_TABLET | Freq: Every day | ORAL | Status: DC
  Administered 2023-04-25 – 2023-04-30 (×6): 40 mg via ORAL
  Filled 2023-04-25 (×6): qty 1

## 2023-04-25 MED ORDER — POLYETHYLENE GLYCOL 3350 17 G PO PACK
17.0000 g | PACK | Freq: Every day | ORAL | Status: DC
  Administered 2023-04-27: 17 g via ORAL
  Filled 2023-04-25 (×2): qty 1

## 2023-04-25 MED ORDER — POTASSIUM PHOSPHATES 15 MMOLE/5ML IV SOLN
30.0000 mmol | Freq: Once | INTRAVENOUS | Status: AC
  Administered 2023-04-25: 30 mmol via INTRAVENOUS
  Filled 2023-04-25: qty 10

## 2023-04-25 MED ORDER — RACEPINEPHRINE HCL 2.25 % IN NEBU
0.5000 mL | INHALATION_SOLUTION | Freq: Once | RESPIRATORY_TRACT | Status: AC
  Administered 2023-04-25: 0.5 mL via RESPIRATORY_TRACT
  Filled 2023-04-25: qty 0.5

## 2023-04-25 MED ORDER — METOCLOPRAMIDE HCL 5 MG/ML IJ SOLN: 5.0000 mg | Freq: Three times a day (TID) | INTRAMUSCULAR | Status: DC | PRN

## 2023-04-25 MED ORDER — ACETAMINOPHEN 500 MG PO TABS
1000.0000 mg | ORAL_TABLET | Freq: Four times a day (QID) | ORAL | Status: DC
  Administered 2023-04-26 – 2023-05-01 (×19): 1000 mg via ORAL
  Filled 2023-04-25 (×22): qty 2

## 2023-04-25 MED ORDER — THIAMINE MONONITRATE 100 MG PO TABS
100.0000 mg | ORAL_TABLET | Freq: Every day | ORAL | Status: AC
  Administered 2023-04-26 – 2023-04-30 (×5): 100 mg via ORAL
  Filled 2023-04-25 (×5): qty 1

## 2023-04-25 MED ORDER — ONDANSETRON HCL 4 MG/2ML IJ SOLN
4.0000 mg | Freq: Four times a day (QID) | INTRAMUSCULAR | Status: DC | PRN
  Administered 2023-04-28: 4 mg via INTRAVENOUS
  Filled 2023-04-25: qty 2

## 2023-04-25 MED ORDER — CLONAZEPAM 0.5 MG PO TABS
0.5000 mg | ORAL_TABLET | Freq: Two times a day (BID) | ORAL | Status: DC
  Administered 2023-04-25 – 2023-05-01 (×12): 0.5 mg via ORAL
  Filled 2023-04-25 (×12): qty 1

## 2023-04-25 MED ORDER — DOCUSATE SODIUM 100 MG PO CAPS
100.0000 mg | ORAL_CAPSULE | Freq: Two times a day (BID) | ORAL | Status: DC
Start: 2023-04-25 — End: 2023-04-27
  Administered 2023-04-25 – 2023-04-27 (×4): 100 mg via ORAL
  Filled 2023-04-25 (×5): qty 1

## 2023-04-25 MED ORDER — SENNA 8.6 MG PO TABS
1.0000 | ORAL_TABLET | Freq: Two times a day (BID) | ORAL | Status: DC
  Administered 2023-04-25 – 2023-05-01 (×11): 8.6 mg via ORAL
  Filled 2023-04-25 (×12): qty 1

## 2023-04-25 MED ORDER — POLYETHYLENE GLYCOL 3350 17 G PO PACK
17.0000 g | PACK | Freq: Every day | ORAL | Status: DC | PRN
  Filled 2023-04-25: qty 1

## 2023-04-25 MED ORDER — ENOXAPARIN SODIUM 60 MG/0.6ML IJ SOSY
50.0000 mg | PREFILLED_SYRINGE | Freq: Two times a day (BID) | INTRAMUSCULAR | Status: DC
  Filled 2023-04-25: qty 0.6

## 2023-04-25 MED ORDER — OXYCODONE HCL 5 MG PO TABS
5.0000 mg | ORAL_TABLET | ORAL | Status: DC | PRN
  Administered 2023-04-25 – 2023-05-01 (×9): 10 mg via ORAL
  Filled 2023-04-25 (×8): qty 2

## 2023-04-25 MED ORDER — METOCLOPRAMIDE HCL 5 MG PO TABS: 5.0000 mg | ORAL_TABLET | Freq: Three times a day (TID) | ORAL | Status: DC | PRN

## 2023-04-25 MED ORDER — ONDANSETRON HCL 4 MG PO TABS
4.0000 mg | ORAL_TABLET | Freq: Four times a day (QID) | ORAL | Status: DC | PRN
  Administered 2023-04-28 – 2023-04-30 (×2): 4 mg via ORAL
  Filled 2023-04-25 (×2): qty 1

## 2023-04-25 MED ORDER — ENOXAPARIN SODIUM 30 MG/0.3ML IJ SOSY
30.0000 mg | PREFILLED_SYRINGE | Freq: Two times a day (BID) | INTRAMUSCULAR | Status: DC
  Administered 2023-04-25 – 2023-05-01 (×13): 30 mg via SUBCUTANEOUS
  Filled 2023-04-25 (×13): qty 0.3

## 2023-04-25 MED ORDER — OXYCODONE HCL 5 MG PO TABS
10.0000 mg | ORAL_TABLET | Freq: Four times a day (QID) | ORAL | Status: DC
  Administered 2023-04-26 – 2023-04-28 (×8): 10 mg via ORAL
  Filled 2023-04-25 (×9): qty 2

## 2023-04-25 MED ORDER — OXYBUTYNIN CHLORIDE 5 MG PO TABS
5.0000 mg | ORAL_TABLET | Freq: Three times a day (TID) | ORAL | Status: DC
  Administered 2023-04-25 – 2023-05-01 (×17): 5 mg via ORAL
  Filled 2023-04-25 (×20): qty 1

## 2023-04-25 NOTE — Plan of Care (Signed)
  Problem: Clinical Measurements: Goal: Respiratory complications will improve Outcome: Not Progressing   
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
## 2023-04-25 NOTE — Progress Notes (Signed)
 2045: During med pass, Patient did not having coughing with sip of water. However, she did have coughing with swallowing pills. Team made notified. Will make patient strictly NPO.   2330: Patient resting comfortably. RN went in to reeducate patient on reasoning of strict NPO during this time. Pt agrees to no oral pain meds due to coughing during swallowing earlier in shift and she stated, " I feel fine right now and don't want to over do anything." Precedex gtt is sufficient at this time.

## 2023-04-25 NOTE — Progress Notes (Signed)
 Patient complains of SOB despite breathing treatments from previous shift. Trauma RN Lewie Loron) notified and at bedside to assess patient.

## 2023-04-25 NOTE — Progress Notes (Signed)
 1630:  Patient with new onset of wheezing.  PRN duoneb given.  Wheezing decrease and patient appears more relax.    1730:  Wheezing increase again, on call trauma MD and trauma TRN notified.  Racepinephrine neb order and given.  Patient was some relieve and updated TRN.

## 2023-04-25 NOTE — Procedures (Signed)
 Extubation Procedure Note  Patient Details:   Name: [name] DOB: Sep 05, 1995 MRN: [id number]   Airway Documentation:    Vent end date: 04/25/23 Vent end time: 1155   Evaluation  O2 sats: stable throughout Complications: No apparent complications Patient did tolerate procedure well. Bilateral Breath Sounds: Diminished, Clear   Yes RT extubated patient per MD order. Cuff leak present, able to speak name and no stridor noted. Placed on 3L humidified O2. Resting comfortably. Elam Dutch 04/25/2023, 12:02 PM

## 2023-04-25 NOTE — Progress Notes (Signed)
 I was notified of ongoing wheezing. Patient examined at bedside. Currently maintaining O2 sats in 90s on 3L nasal cannula. Has audible wheezing but no stridor. Received duonebs and racemic epi earlier this evening with some improvement in symptoms. On klonopin and seroquel for anxiety, as well as precedex. Will continue to monitor respiratory status closely. Continue duonebs and racemic epi q4h prn.
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
## 2023-04-26 LAB — GLUCOSE, CAPILLARY
Glucose-Capillary: 108 mg/dL — ABNORMAL HIGH (ref 70–99)
Glucose-Capillary: 115 mg/dL — ABNORMAL HIGH (ref 70–99)
Glucose-Capillary: 83 mg/dL (ref 70–99)
Glucose-Capillary: 89 mg/dL (ref 70–99)
Glucose-Capillary: 92 mg/dL (ref 70–99)
Glucose-Capillary: 92 mg/dL (ref 70–99)

## 2023-04-26 LAB — PHOSPHORUS: Phosphorus: 3.4 mg/dL (ref 2.5–4.6)

## 2023-04-26 LAB — BASIC METABOLIC PANEL WITH GFR
Anion gap: 9 (ref 5–15)
BUN: 14 mg/dL (ref 6–20)
CO2: 25 mmol/L (ref 22–32)
Calcium: 8.3 mg/dL — ABNORMAL LOW (ref 8.9–10.3)
Chloride: 104 mmol/L (ref 98–111)
Creatinine, Ser: 0.53 mg/dL (ref 0.44–1.00)
GFR, Estimated: >60 mL/min (ref >60)
Glucose, Bld: 96 mg/dL (ref 70–99)
Potassium: 3.8 mmol/L (ref 3.5–5.1)
Sodium: 138 mmol/L (ref 135–145)

## 2023-04-26 LAB — MAGNESIUM: Magnesium: 1.9 mg/dL (ref 1.7–2.4)

## 2023-04-26 LAB — CBC
HCT: 25.2 % — ABNORMAL LOW (ref 36.0–46.0)
Hemoglobin: 8.4 g/dL — ABNORMAL LOW (ref 12.0–15.0)
MCH: 27.7 pg (ref 26.0–34.0)
MCHC: 33.3 g/dL (ref 30.0–36.0)
MCV: 83.2 fL (ref 80.0–100.0)
Platelets: 166 10*3/uL (ref 150–400)
RBC: 3.03 MIL/uL — ABNORMAL LOW (ref 3.87–5.11)
RDW: 15.4 % (ref 11.5–15.5)
WBC: 4.4 10*3/uL (ref 4.0–10.5)
nRBC: 0.7 % — ABNORMAL HIGH (ref 0.0–0.2)

## 2023-04-26 LAB — VITAMIN D 25 HYDROXY (VIT D DEFICIENCY, FRACTURES): Vit D, 25-Hydroxy: 15.67 ng/mL — ABNORMAL LOW (ref 30–100)

## 2023-04-26 MED ORDER — HYDROMORPHONE HCL 1 MG/ML IJ SOLN
0.5000 mg | INTRAMUSCULAR | Status: DC | PRN
  Administered 2023-04-26 – 2023-04-28 (×9): 0.5 mg via INTRAVENOUS
  Filled 2023-04-26 (×10): qty 1

## 2023-04-26 MED ORDER — ORAL CARE MOUTH RINSE: 15.0000 mL | OROMUCOSAL | Status: DC | PRN

## 2023-04-26 MED ORDER — METHOCARBAMOL 500 MG PO TABS
500.0000 mg | ORAL_TABLET | Freq: Four times a day (QID) | ORAL | Status: DC | PRN
  Administered 2023-04-26: 500 mg via ORAL
  Filled 2023-04-26 (×2): qty 1

## 2023-04-26 MED ORDER — HYDROCORTISONE SOD SUC (PF) 100 MG IJ SOLR
100.0000 mg | Freq: Once | INTRAMUSCULAR | Status: AC
  Administered 2023-04-26: 100 mg via INTRAVENOUS
  Filled 2023-04-26: qty 2

## 2023-04-26 MED ORDER — VITAMIN D 25 MCG (1000 UNIT) PO TABS
2000.0000 [IU] | ORAL_TABLET | Freq: Two times a day (BID) | ORAL | Status: DC
  Administered 2023-04-26 – 2023-05-01 (×11): 2000 [IU] via ORAL
  Filled 2023-04-26 (×11): qty 2

## 2023-04-26 MED ORDER — ORAL CARE MOUTH RINSE
15.0000 mL | OROMUCOSAL | Status: DC
  Administered 2023-04-26 – 2023-05-01 (×20): 15 mL via OROMUCOSAL

## 2023-04-26 NOTE — Progress Notes (Signed)
 Trauma Event Note    Notified by primary RN of ongoing wheezing since this afternoon post extubation. Pt reports shortness of breath and anxiety. BS with wheeze, wheezing to neck auscultation. RR 30s, other VSS. Attempted to reposition patient in an effort to improve s/s with no change in pt's discomfort. Notified MD of exam, plan for PRN racemic epi and duoneb treatments, utilize precedex gtt. Primary RN aware of POC.   Last imported Vital Signs BP 120/74   Pulse 92   Temp 99.2 F (37.3 C) (Axillary)   Resp 15   Ht 5\' 6"  (1.676 m)   Wt 275 lb 2.2 oz (124.8 kg)   LMP  (LMP Unknown)   SpO2 99%   BMI 44.41 kg/m   Trending CBC Recent Labs    04/23/23 0920 04/23/23 1312 04/23/23 1554 04/24/23 0558 04/25/23 0558  WBC 4.3  --   --  3.2* 3.5*  HGB 8.0*   < > 8.8* 8.0* 7.2*  HCT 25.0*   < > 26.0* 24.1* 22.3*  PLT 133*  --   --  106* 116*   < > = values in this interval not displayed.    Trending Coag's No results for input(s): "APTT", "INR" in the last 72 hours.  Trending BMET Recent Labs    04/23/23 0920 04/23/23 1312 04/23/23 1554 04/24/23 0558 04/25/23 0558  NA 137   < > 136 137 138  K 3.7   < > 4.0 4.0 3.3*  CL 106  --   --  105 107  CO2 22  --   --  20* 27  BUN 8  --   --  13 20  CREATININE 0.68  --   --  0.56 0.51  GLUCOSE 83  --   --  95 115*   < > = values in this interval not displayed.      Timberlyn Pickford O Jeanee Fabre  Trauma Response RN  Please call TRN at [phone] for further assistance.
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
## 2023-04-26 NOTE — Plan of Care (Signed)
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
## 2023-04-27 LAB — BASIC METABOLIC PANEL WITH GFR
Anion gap: 9 (ref 5–15)
BUN: 21 mg/dL — ABNORMAL HIGH (ref 6–20)
CO2: 26 mmol/L (ref 22–32)
Calcium: 8.7 mg/dL — ABNORMAL LOW (ref 8.9–10.3)
Chloride: 103 mmol/L (ref 98–111)
Creatinine, Ser: 0.44 mg/dL (ref 0.44–1.00)
GFR, Estimated: >60 mL/min (ref >60)
Glucose, Bld: 92 mg/dL (ref 70–99)
Potassium: 3.7 mmol/L (ref 3.5–5.1)
Sodium: 138 mmol/L (ref 135–145)

## 2023-04-27 LAB — GLUCOSE, CAPILLARY
Glucose-Capillary: 78 mg/dL (ref 70–99)
Glucose-Capillary: 90 mg/dL (ref 70–99)
Glucose-Capillary: 85 mg/dL (ref 70–99)
Glucose-Capillary: 78 mg/dL (ref 70–99)
Glucose-Capillary: 75 mg/dL (ref 70–99)

## 2023-04-27 LAB — CBC
HCT: 25.9 % — ABNORMAL LOW (ref 36.0–46.0)
Hemoglobin: 8.6 g/dL — ABNORMAL LOW (ref 12.0–15.0)
MCH: 27.7 pg (ref 26.0–34.0)
MCHC: 33.2 g/dL (ref 30.0–36.0)
MCV: 83.3 fL (ref 80.0–100.0)
Platelets: 202 10*3/uL (ref 150–400)
RBC: 3.11 MIL/uL — ABNORMAL LOW (ref 3.87–5.11)
RDW: 15 % (ref 11.5–15.5)
WBC: 3.8 10*3/uL — ABNORMAL LOW (ref 4.0–10.5)
nRBC: 0.8 % — ABNORMAL HIGH (ref 0.0–0.2)

## 2023-04-27 LAB — TRIGLYCERIDES: Triglycerides: 281 mg/dL — ABNORMAL HIGH (ref <150)

## 2023-04-27 MED ORDER — ENSURE ENLIVE PO LIQD
237.0000 mL | Freq: Every day | ORAL | Status: DC | PRN
  Administered 2023-04-27: 237 mL via ORAL

## 2023-04-27 MED ORDER — BOOST / RESOURCE BREEZE PO LIQD CUSTOM
1.0000 | Freq: Three times a day (TID) | ORAL | Status: DC
  Administered 2023-04-28: 1 via ORAL

## 2023-04-27 MED ORDER — ALBUMIN HUMAN 5 % IV SOLN
25.0000 g | Freq: Once | INTRAVENOUS | Status: AC
  Administered 2023-04-27: 25 g via INTRAVENOUS
  Filled 2023-04-27: qty 500

## 2023-04-27 MED ORDER — QUETIAPINE FUMARATE 50 MG PO TABS
100.0000 mg | ORAL_TABLET | Freq: Two times a day (BID) | ORAL | Status: DC
  Administered 2023-04-27 – 2023-05-01 (×7): 100 mg via ORAL
  Filled 2023-04-27 (×2): qty 2
  Filled 2023-04-27: qty 1
  Filled 2023-04-27 (×4): qty 2
  Filled 2023-04-27 (×2): qty 1

## 2023-04-27 MED ORDER — DOCUSATE SODIUM 50 MG/5ML PO LIQD
100.0000 mg | Freq: Two times a day (BID) | ORAL | Status: DC
  Administered 2023-04-28 – 2023-04-29 (×2): 100 mg via ORAL
  Filled 2023-04-27 (×7): qty 10
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
## 2023-04-27 NOTE — Progress Notes (Signed)
 Inpatient Rehab Admissions Coordinator:   Per therapy recommendations, patient was screened for CIR candidacy by Megan Salon, MS, CCC-SLP. At this time, Pt. is not at a level to tolerate the intensity of CIR ; however,   Pt. may have potential to progress to becoming a potential CIR candidate, so CIR admissions team will follow and monitor for progress and participation with therapies and place consult order if Pt. appears to be an appropriate candidate. Please contact me with any questions.    Megan Salon, MS, CCC-SLP Rehab Admissions Coordinator  [phone] (celll) [phone] (office)
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
## 2023-04-28 ENCOUNTER — Encounter (HOSPITAL_COMMUNITY): Payer: Self-pay | Admitting: Student

## 2023-04-28 LAB — CBC
HCT: 27 % — ABNORMAL LOW (ref 36.0–46.0)
Hemoglobin: 8.8 g/dL — ABNORMAL LOW (ref 12.0–15.0)
MCH: 27.2 pg (ref 26.0–34.0)
MCHC: 32.6 g/dL (ref 30.0–36.0)
MCV: 83.3 fL (ref 80.0–100.0)
Platelets: 249 10*3/uL (ref 150–400)
RBC: 3.24 MIL/uL — ABNORMAL LOW (ref 3.87–5.11)
RDW: 15.1 % (ref 11.5–15.5)
WBC: 3.5 10*3/uL — ABNORMAL LOW (ref 4.0–10.5)
nRBC: 0.9 % — ABNORMAL HIGH (ref 0.0–0.2)

## 2023-04-28 LAB — GLUCOSE, CAPILLARY
Glucose-Capillary: 75 mg/dL (ref 70–99)
Glucose-Capillary: 77 mg/dL (ref 70–99)
Glucose-Capillary: 81 mg/dL (ref 70–99)
Glucose-Capillary: 81 mg/dL (ref 70–99)
Glucose-Capillary: 92 mg/dL (ref 70–99)

## 2023-04-28 LAB — BASIC METABOLIC PANEL WITH GFR
Anion gap: 11 (ref 5–15)
BUN: 18 mg/dL (ref 6–20)
CO2: 25 mmol/L (ref 22–32)
Calcium: 8.1 mg/dL — ABNORMAL LOW (ref 8.9–10.3)
Chloride: 100 mmol/L (ref 98–111)
Creatinine, Ser: 0.55 mg/dL (ref 0.44–1.00)
GFR, Estimated: >60 mL/min (ref >60)
Glucose, Bld: 84 mg/dL (ref 70–99)
Potassium: 3.7 mmol/L (ref 3.5–5.1)
Sodium: 136 mmol/L (ref 135–145)

## 2023-04-28 MED ORDER — GABAPENTIN 300 MG PO CAPS
300.0000 mg | ORAL_CAPSULE | Freq: Three times a day (TID) | ORAL | Status: DC
  Administered 2023-04-28 – 2023-05-01 (×10): 300 mg via ORAL
  Filled 2023-04-28 (×10): qty 1

## 2023-04-28 MED ORDER — HYDROMORPHONE HCL 1 MG/ML IJ SOLN
0.5000 mg | INTRAMUSCULAR | Status: DC | PRN
  Administered 2023-04-28 – 2023-05-01 (×7): 0.5 mg via INTRAVENOUS
  Filled 2023-04-28: qty 1
  Filled 2023-04-28 (×7): qty 0.5

## 2023-04-28 MED ORDER — KETOROLAC TROMETHAMINE 15 MG/ML IJ SOLN
30.0000 mg | Freq: Four times a day (QID) | INTRAMUSCULAR | Status: DC
  Administered 2023-04-28 – 2023-05-01 (×10): 30 mg via INTRAVENOUS
  Filled 2023-04-28 (×10): qty 2

## 2023-04-28 MED ORDER — CYCLOBENZAPRINE HCL 10 MG PO TABS
10.0000 mg | ORAL_TABLET | Freq: Three times a day (TID) | ORAL | Status: DC
  Administered 2023-04-28 – 2023-05-01 (×10): 10 mg via ORAL
  Filled 2023-04-28 (×10): qty 1

## 2023-04-28 MED ORDER — DULOXETINE HCL 30 MG PO CPEP
30.0000 mg | ORAL_CAPSULE | Freq: Every day | ORAL | Status: DC
  Administered 2023-04-28 – 2023-05-01 (×4): 30 mg via ORAL
  Filled 2023-04-28 (×4): qty 1

## 2023-04-28 MED ORDER — OXYCODONE HCL ER 10 MG PO T12A
20.0000 mg | EXTENDED_RELEASE_TABLET | Freq: Two times a day (BID) | ORAL | Status: DC
  Administered 2023-04-28 – 2023-05-01 (×7): 20 mg via ORAL
  Filled 2023-04-28 (×7): qty 2

## 2023-04-28 MED ORDER — BISACODYL 10 MG RE SUPP
10.0000 mg | Freq: Once | RECTAL | Status: DC
  Filled 2023-04-28: qty 1

## 2023-04-28 MED ORDER — BISACODYL 5 MG PO TBEC
10.0000 mg | DELAYED_RELEASE_TABLET | Freq: Once | ORAL | Status: AC
  Administered 2023-04-28: 10 mg via ORAL
  Filled 2023-04-28: qty 2

## 2023-04-28 MED ORDER — POLYETHYLENE GLYCOL 3350 17 G PO PACK
17.0000 g | PACK | Freq: Three times a day (TID) | ORAL | Status: DC
Start: 2023-04-28 — End: 2023-05-01
  Administered 2023-04-28 – 2023-04-30 (×4): 17 g via ORAL
  Filled 2023-04-28 (×10): qty 1

## 2023-04-28 NOTE — Plan of Care (Signed)

## 2023-04-28 NOTE — Progress Notes (Signed)
 In room at this time to medicate and assess patient.  Patient noted to have another vape in hand at this time.  Informed patient again that she could not be vaping in room.  She again told me it was her mothers.  Stated that if it were her mothers she should have it in her purse because this is not allowed in facility.  Mother stated "I told you, you shouldn't have it it is going to cause a setback"  Mother quickly took vape placed in her purse and left the room abruptly.
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
## 2023-04-28 NOTE — Progress Notes (Signed)
 Vitals elevated at this time due to PT working with patient.

## 2023-04-28 NOTE — Progress Notes (Signed)
 Vape found in bed while repositioning patient was placed in patient chart.  Informed patient that she could not be vaping in facility.  She stated it was her mothers.  Still informed that this is not allowed on the property regardless of whose it is.

## 2023-04-28 NOTE — Plan of Care (Signed)
 Patient still continues to exhibit agitation/anxiety intermittently. Still on Precedex gtt and unable to titrate down much due to patient calling in this nurse to the room with concerns of feeling anxious. Patient has had minimal oral intake, with blood sugars trending down. Encouraged patient to increase intake to promote wound healing. ICU status maintained.    Problem: Clinical Measurements: Goal: Ability to maintain clinical measurements within normal limits will improve Outcome: Progressing   Problem: Nutrition: Goal: Adequate nutrition will be maintained Outcome: Not Progressing   Problem: Coping: Goal: Level of anxiety will decrease Outcome: Not Progressing   Problem: Elimination: Goal: Will not experience complications related to bowel motility Outcome: Not Progressing

## 2023-04-29 LAB — BASIC METABOLIC PANEL WITH GFR
Anion gap: 9 (ref 5–15)
BUN: 20 mg/dL (ref 6–20)
CO2: 25 mmol/L (ref 22–32)
Calcium: 8.3 mg/dL — ABNORMAL LOW (ref 8.9–10.3)
Chloride: 102 mmol/L (ref 98–111)
Creatinine, Ser: 0.63 mg/dL (ref 0.44–1.00)
GFR, Estimated: >60 mL/min (ref >60)
Glucose, Bld: 103 mg/dL — ABNORMAL HIGH (ref 70–99)
Potassium: 3.6 mmol/L (ref 3.5–5.1)
Sodium: 136 mmol/L (ref 135–145)

## 2023-04-29 LAB — CBC
HCT: 28.5 % — ABNORMAL LOW (ref 36.0–46.0)
Hemoglobin: 9.3 g/dL — ABNORMAL LOW (ref 12.0–15.0)
MCH: 27.5 pg (ref 26.0–34.0)
MCHC: 32.6 g/dL (ref 30.0–36.0)
MCV: 84.3 fL (ref 80.0–100.0)
Platelets: 299 10*3/uL (ref 150–400)
RBC: 3.38 MIL/uL — ABNORMAL LOW (ref 3.87–5.11)
RDW: 15.9 % — ABNORMAL HIGH (ref 11.5–15.5)
WBC: 3.6 10*3/uL — ABNORMAL LOW (ref 4.0–10.5)
nRBC: 0.8 % — ABNORMAL HIGH (ref 0.0–0.2)

## 2023-04-29 LAB — GLUCOSE, CAPILLARY
Glucose-Capillary: 101 mg/dL — ABNORMAL HIGH (ref 70–99)
Glucose-Capillary: 110 mg/dL — ABNORMAL HIGH (ref 70–99)
Glucose-Capillary: 115 mg/dL — ABNORMAL HIGH (ref 70–99)
Glucose-Capillary: 146 mg/dL — ABNORMAL HIGH (ref 70–99)
Glucose-Capillary: 79 mg/dL (ref 70–99)
Glucose-Capillary: 84 mg/dL (ref 70–99)

## 2023-04-29 MED ORDER — ENSURE ENLIVE PO LIQD
237.0000 mL | Freq: Two times a day (BID) | ORAL | Status: DC
  Administered 2023-04-29 – 2023-04-30 (×2): 237 mL via ORAL

## 2023-04-29 NOTE — Progress Notes (Signed)
 Inpatient Rehab Admissions Coordinator:   Consult received.  Progress with therapy limited by pain and pt unable to tolerate past EOB at this time.  Will follow for 1-2 more therapy sessions for progression but at this time I do not believe she would tolerate the intensity of CIR program.   Estill Dooms, PT, DPT Admissions Coordinator [phone] 04/29/23  12:46 PM '
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
## 2023-04-29 NOTE — Plan of Care (Signed)
  Problem: Education: Goal: Knowledge of General Education information will improve Description: Including pain rating scale, medication(s)/side effects and non-pharmacologic comfort measures Outcome: Progressing   Problem: Clinical Measurements: Goal: Respiratory complications will improve Outcome: Progressing Goal: Cardiovascular complication will be avoided Outcome: Progressing   Problem: Activity: Goal: Risk for activity intolerance will decrease Outcome: Progressing   Problem: Nutrition: Goal: Adequate nutrition will be maintained Outcome: Progressing   Problem: Pain Managment: Goal: General experience of comfort will improve and/or be controlled Outcome: Progressing

## 2023-04-29 NOTE — Progress Notes (Signed)
 Mobility Specialist Progress Note:   04/29/23 1625  Mobility  Activity Transferred from chair to bed  Level of Assistance +2 (takes two people)  Assistive Device MaxiMove  RUE Weight Bearing Per Provider Order NWB  RLE Weight Bearing Per Provider Order NWB  Mobility visit 1 Mobility  Mobility Specialist Start Time (ACUTE ONLY) 1612  Mobility Specialist Stop Time (ACUTE ONLY) 1625  Mobility Specialist Time Calculation (min) (ACUTE ONLY) 13 min   Pt received in chair, requesting assistance back to bed. C/o RUE pain during transfer, otherwise asx throughout. Pt left in bed with NT and RN present in room.    Leory Plowman  Mobility Specialist Please contact via [employer] office at [phone]
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
## 2023-04-30 ENCOUNTER — Encounter (HOSPITAL_COMMUNITY): Payer: Self-pay | Admitting: Student

## 2023-04-30 LAB — CBC
HCT: 27.7 % — ABNORMAL LOW (ref 36.0–46.0)
Hemoglobin: 9 g/dL — ABNORMAL LOW (ref 12.0–15.0)
MCH: 27.7 pg (ref 26.0–34.0)
MCHC: 32.5 g/dL (ref 30.0–36.0)
MCV: 85.2 fL (ref 80.0–100.0)
Platelets: 331 10*3/uL (ref 150–400)
RBC: 3.25 MIL/uL — ABNORMAL LOW (ref 3.87–5.11)
RDW: 16.5 % — ABNORMAL HIGH (ref 11.5–15.5)
WBC: 4.5 10*3/uL (ref 4.0–10.5)
nRBC: 0.4 % — ABNORMAL HIGH (ref 0.0–0.2)

## 2023-04-30 LAB — GLUCOSE, CAPILLARY
Glucose-Capillary: 116 mg/dL — ABNORMAL HIGH (ref 70–99)
Glucose-Capillary: 124 mg/dL — ABNORMAL HIGH (ref 70–99)
Glucose-Capillary: 132 mg/dL — ABNORMAL HIGH (ref 70–99)
Glucose-Capillary: 144 mg/dL — ABNORMAL HIGH (ref 70–99)
Glucose-Capillary: 81 mg/dL (ref 70–99)
Glucose-Capillary: 83 mg/dL (ref 70–99)

## 2023-04-30 MED ORDER — ENSURE MAX PROTEIN PO LIQD
11.0000 [oz_av] | Freq: Every day | ORAL | Status: DC
  Administered 2023-04-30 – 2023-05-01 (×2): 11 [oz_av] via ORAL
  Filled 2023-04-30 (×2): qty 330

## 2023-04-30 MED ORDER — BISACODYL 10 MG RE SUPP: 10.0000 mg | Freq: Once | RECTAL | Status: DC

## 2023-04-30 NOTE — Plan of Care (Signed)
  Problem: Clinical Measurements: Goal: Ability to maintain clinical measurements within normal limits will improve Outcome: Progressing Goal: Diagnostic test results will improve Outcome: Progressing   Problem: Nutrition: Goal: Adequate nutrition will be maintained Outcome: Progressing   Problem: Coping: Goal: Level of anxiety will decrease Outcome: Progressing   
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
## 2023-04-30 NOTE — Plan of Care (Signed)
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
## 2023-04-30 NOTE — TOC Progression Note (Signed)
 Transition of Care [facility]) - Progression Note    Patient Details  Name: [name] MRN: [id number] Date of Birth: 05-12-1995  [facility] CM/SW Contact  Glennon Mac, RN Phone Number: 04/30/2023, 3:30pm  Clinical Narrative:    Met with patient and her mother; patient states she does want to go to inpatient rehab.  Mom states they plan to dc to g-mother's home, and they are having a ramp built there.  Patient will have 24h assistance at discharge.  Encouraged patient to continue working with therapies, and made her aware that she needs to show more progress in order to be accepted by CIR.  She states she understands this.  Will continue to follow progress.    Expected Discharge Plan: IP Rehab Facility Barriers to Discharge: Continued Medical Work up  Expected Discharge Plan and Services   Discharge Planning Services: CM Consult   Living arrangements for the past 2 months: Mobile Home                                       Social Determinants of Health (SDOH) Interventions SDOH Screenings   Food Insecurity: Patient Unable To Answer (04/21/2023)  Housing: Patient Unable To Answer (04/21/2023)  Transportation Needs: No Transportation Needs (04/21/2023)  Utilities: Not At Risk (04/21/2023)  Social Connections: Patient Unable To Answer (04/21/2023)  Tobacco Use: High Risk (04/21/2023)    Readmission Risk Interventions     No data to display         Quintella Baton, RN, BSN  Trauma/Neuro ICU Case Manager [phone]
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
## 2023-05-01 ENCOUNTER — Other Ambulatory Visit: Payer: Self-pay

## 2023-05-01 ENCOUNTER — Encounter (HOSPITAL_COMMUNITY): Payer: Self-pay | Admitting: Student

## 2023-05-01 ENCOUNTER — Inpatient Hospital Stay (HOSPITAL_COMMUNITY)
Admission: EM | Admit: 2023-05-01 | Discharge: 2023-05-08 | DRG: 963 | Disposition: A | Discharge status: Home / Self Care | Payer: MEDICAID | Attending: Surgery | Admitting: Surgery

## 2023-05-01 DIAGNOSIS — S42401A Unspecified fracture of lower end of right humerus, initial encounter for closed fracture: Secondary | ICD-10-CM | POA: Diagnosis present

## 2023-05-01 DIAGNOSIS — S3729XA Other injury of bladder, initial encounter: Secondary | ICD-10-CM | POA: Diagnosis present

## 2023-05-01 DIAGNOSIS — S2232XA Fracture of one rib, left side, initial encounter for closed fracture: Principal | ICD-10-CM | POA: Diagnosis present

## 2023-05-01 DIAGNOSIS — F4310 Post-traumatic stress disorder, unspecified: Secondary | ICD-10-CM | POA: Diagnosis present

## 2023-05-01 DIAGNOSIS — F430 Acute stress reaction: Secondary | ICD-10-CM | POA: Diagnosis not present

## 2023-05-01 DIAGNOSIS — S36031A Moderate laceration of spleen, initial encounter: Secondary | ICD-10-CM | POA: Diagnosis present

## 2023-05-01 DIAGNOSIS — S32302A Unspecified fracture of left ilium, initial encounter for closed fracture: Secondary | ICD-10-CM | POA: Diagnosis present

## 2023-05-01 DIAGNOSIS — F411 Generalized anxiety disorder: Secondary | ICD-10-CM | POA: Diagnosis present

## 2023-05-01 DIAGNOSIS — S82852A Displaced trimalleolar fracture of left lower leg, initial encounter for closed fracture: Secondary | ICD-10-CM | POA: Insufficient documentation

## 2023-05-01 DIAGNOSIS — F17290 Nicotine dependence, other tobacco product, uncomplicated: Secondary | ICD-10-CM | POA: Diagnosis present

## 2023-05-01 DIAGNOSIS — Z79899 Other long term (current) drug therapy: Secondary | ICD-10-CM

## 2023-05-01 DIAGNOSIS — Y92410 Unspecified street and highway as the place of occurrence of the external cause: Secondary | ICD-10-CM | POA: Diagnosis not present

## 2023-05-01 DIAGNOSIS — Z6837 Body mass index (BMI) 37.0-37.9, adult: Secondary | ICD-10-CM

## 2023-05-01 DIAGNOSIS — F1110 Opioid abuse, uncomplicated: Secondary | ICD-10-CM | POA: Diagnosis present

## 2023-05-01 DIAGNOSIS — Z9151 Personal history of suicidal behavior: Secondary | ICD-10-CM

## 2023-05-01 DIAGNOSIS — R34 Anuria and oliguria: Secondary | ICD-10-CM | POA: Diagnosis present

## 2023-05-01 DIAGNOSIS — E66813 Obesity, class 3: Secondary | ICD-10-CM | POA: Diagnosis present

## 2023-05-01 DIAGNOSIS — D62 Acute posthemorrhagic anemia: Secondary | ICD-10-CM | POA: Diagnosis present

## 2023-05-01 DIAGNOSIS — J9601 Acute respiratory failure with hypoxia: Secondary | ICD-10-CM | POA: Diagnosis present

## 2023-05-01 DIAGNOSIS — Z888 Allergy status to other drugs, medicaments and biological substances status: Secondary | ICD-10-CM

## 2023-05-01 DIAGNOSIS — T1490XA Injury, unspecified, initial encounter: Secondary | ICD-10-CM | POA: Diagnosis present

## 2023-05-01 DIAGNOSIS — S32302B Unspecified fracture of left ilium, initial encounter for open fracture: Secondary | ICD-10-CM | POA: Insufficient documentation

## 2023-05-01 LAB — GLUCOSE, CAPILLARY
Glucose-Capillary: 101 mg/dL — ABNORMAL HIGH (ref 70–99)
Glucose-Capillary: 125 mg/dL — ABNORMAL HIGH (ref 70–99)
Glucose-Capillary: 94 mg/dL (ref 70–99)
Glucose-Capillary: 96 mg/dL (ref 70–99)

## 2023-05-01 LAB — URINALYSIS, ROUTINE W REFLEX MICROSCOPIC
Bilirubin Urine: NEGATIVE
Glucose, UA: NEGATIVE mg/dL
Ketones, ur: NEGATIVE mg/dL
Leukocytes,Ua: NEGATIVE
Nitrite: NEGATIVE
Protein, ur: 100 mg/dL — AB
RBC / HPF: >50 RBC/hpf (ref 0–5)
Specific Gravity, Urine: 1.032 — ABNORMAL HIGH (ref 1.005–1.030)
pH: 6 (ref 5.0–8.0)

## 2023-05-01 LAB — CBC
HCT: 29.5 % — ABNORMAL LOW (ref 36.0–46.0)
Hemoglobin: 9.2 g/dL — ABNORMAL LOW (ref 12.0–15.0)
MCH: 27.5 pg (ref 26.0–34.0)
MCHC: 31.2 g/dL (ref 30.0–36.0)
MCV: 88.1 fL (ref 80.0–100.0)
Platelets: 358 10*3/uL (ref 150–400)
RBC: 3.35 MIL/uL — ABNORMAL LOW (ref 3.87–5.11)
RDW: 16.8 % — ABNORMAL HIGH (ref 11.5–15.5)
WBC: 4.4 10*3/uL (ref 4.0–10.5)
nRBC: 0 % (ref 0.0–0.2)

## 2023-05-01 LAB — BASIC METABOLIC PANEL WITH GFR
Anion gap: 11 (ref 5–15)
BUN: 23 mg/dL — ABNORMAL HIGH (ref 6–20)
CO2: 28 mmol/L (ref 22–32)
Calcium: 8.8 mg/dL — ABNORMAL LOW (ref 8.9–10.3)
Chloride: 101 mmol/L (ref 98–111)
Creatinine, Ser: 0.57 mg/dL (ref 0.44–1.00)
GFR, Estimated: >60 mL/min (ref >60)
Glucose, Bld: 87 mg/dL (ref 70–99)
Potassium: 4.3 mmol/L (ref 3.5–5.1)
Sodium: 140 mmol/L (ref 135–145)

## 2023-05-01 MED ORDER — ENOXAPARIN SODIUM 30 MG/0.3ML IJ SOSY
30.0000 mg | PREFILLED_SYRINGE | Freq: Two times a day (BID) | INTRAMUSCULAR | Status: DC
  Filled 2023-05-01: qty 0.3

## 2023-05-01 MED ORDER — GABAPENTIN 300 MG PO CAPS
300.0000 mg | ORAL_CAPSULE | Freq: Three times a day (TID) | ORAL | Status: DC
  Administered 2023-05-01 – 2023-05-07 (×18): 300 mg via ORAL
  Filled 2023-05-01 (×19): qty 1

## 2023-05-01 MED ORDER — ACETAMINOPHEN 500 MG PO TABS
1000.0000 mg | ORAL_TABLET | Freq: Four times a day (QID) | ORAL | Status: DC
  Administered 2023-05-02 – 2023-05-07 (×18): 1000 mg via ORAL
  Filled 2023-05-01 (×22): qty 2

## 2023-05-01 MED ORDER — OXYCODONE HCL 5 MG PO TABS
5.0000 mg | ORAL_TABLET | ORAL | Status: DC | PRN
  Administered 2023-05-05 – 2023-05-06 (×4): 5 mg via ORAL
  Filled 2023-05-01 (×4): qty 1

## 2023-05-01 MED ORDER — ONDANSETRON HCL 4 MG/2ML IJ SOLN
4.0000 mg | Freq: Four times a day (QID) | INTRAMUSCULAR | Status: DC | PRN
  Administered 2023-05-06: 4 mg via INTRAVENOUS
  Filled 2023-05-01: qty 2

## 2023-05-01 MED ORDER — ONDANSETRON 4 MG PO TBDP
4.0000 mg | ORAL_TABLET | Freq: Four times a day (QID) | ORAL | Status: DC | PRN
  Administered 2023-05-04: 4 mg via ORAL
  Filled 2023-05-01: qty 1

## 2023-05-01 MED ORDER — OXYCODONE HCL 5 MG PO TABS
10.0000 mg | ORAL_TABLET | ORAL | Status: DC | PRN
  Administered 2023-05-01 – 2023-05-06 (×6): 10 mg via ORAL
  Filled 2023-05-01 (×7): qty 2

## 2023-05-01 MED ORDER — POLYETHYLENE GLYCOL 3350 17 G PO PACK: 17.0000 g | PACK | Freq: Every day | ORAL | Status: DC | PRN

## 2023-05-01 MED ORDER — DOCUSATE SODIUM 100 MG PO CAPS
100.0000 mg | ORAL_CAPSULE | Freq: Two times a day (BID) | ORAL | Status: DC
  Administered 2023-05-03 – 2023-05-05 (×4): 100 mg via ORAL
  Filled 2023-05-01 (×11): qty 1

## 2023-05-01 MED ORDER — HYDROMORPHONE HCL 1 MG/ML IJ SOLN: 0.5000 mg | INTRAMUSCULAR | Status: DC | PRN
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
## 2023-05-01 NOTE — H&P (Signed)
 Patient left [facility] [address] shortly after 5 PM today AGAINST MEDICAL ADVICE.  Shortly thereafter she presented to the [facility] emergency department to request admission there.  Patient was informed there is no trauma service available at [facility] and after several hours consented to be readmitted to the [facility] trauma service.

## 2023-05-01 NOTE — Progress Notes (Addendum)
 Inpatient Rehab Admissions Coordinator:   Stopped by to see patient and PT/OT in the room discussing d/c plans.  Pt states she would like to d/c home and therapy working on what that would look like.  I explained CIR expectations and goals of w/c level, min to mod assist.  Explained, better/faster with CIR.  She states she does have healthy blue medicaid and I will work on verifying a Tour manager.    1338: I was able to verify a [employer], Medicaid ID [id number] P.  I've updated admitting to correct in her chart.    Estill Dooms, PT, DPT Admissions Coordinator [phone] 05/01/23  1:33 PM
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
## 2023-05-01 NOTE — Progress Notes (Signed)
 OT/ PT note  OT Cancellation Note  Patient Details Name: [name] MRN: [id number] DOB: 1995/07/10   Cancelled Treatment:    Reason Eval/Treat Not Completed: Patient declined, no reason specified Pt reporting "I am tired of this hospital and just want to go home". Pt gives information to CM for AIR regarding insurance being [facility]. OT/PT encouraged participation and educationing on the benefits of helping decreas the burden of care for family. Please share with therapy any DME that pt could qualify for regarding d/c planning if declined from AIR. Recommended DME includes hoyer lift ( educated pt this will not be the same as in acute care), drop arm BSC, w/c with cushion leg rest, sliding board and home health services (educated that no insurance on file so would not qualify for therapy in home). Pt verbalized that grandmother was willing to private pay for home based therapy and described an outpatient setting. Therapy to continue to follow acutely.    Mateo Flow 05/01/2023, 12:46 PM

## 2023-05-01 NOTE — Progress Notes (Signed)
 Patient was advised of visitors being prohibited after repeat violations of a vape being found in the bed with the patient. Patient stated she will not be leaving without her mom and she wants to leave AMA. IV has been removed, tolerated well. Urinary catheter has been left in. Patient has been advised the risk of leaving AMA and has agreed to do so. AMA papers have been signed and placed in chart. Patient is now getting dressed with the assistance of her mother [name]. Bed in lowest position, call light within reach.

## 2023-05-01 NOTE — ED Notes (Signed)
 ED TO INPATIENT HANDOFF REPORT  Name/Age/Gender [name] 28 y.o. female  Code Status    Code Status Orders  (From admission, onward)           Start     Ordered   05/01/23 2149  Full code  Continuous       Question:  By:  Answer:  Other   05/01/23 2150           Code Status History     Date Active Date Inactive Code Status Order ID Comments User Context   04/20/2023 2147 05/01/2023 1849 Full Code [id number]  Kinsinger, De Blanch, MD Inpatient       Home/SNF/Other Home  Chief Complaint Trauma [T14.90XA]  Level of Care/Admitting Diagnosis ED Disposition     ED Disposition  Admit   Condition  --   Comment  Hospital Area: [facility] [[id number]]  Level of Care: Med-Surg [16]  May admit patient to [facility] if equivalent level of care is available:: No  Covid Evaluation: Asymptomatic - no recent exposure (last 10 days) testing not required  Diagnosis: Trauma [875643]  Admitting Physician: TRAUMA MD [[id number]]  Attending Physician: TRAUMA MD [[id number]]  Bed request comments: 6N  Certification:: I certify this patient will need inpatient services for at least 2 midnights  Expected Medical Readiness: 05/05/2023          Medical History Past Medical History:  Diagnosis Date   Accidental overdose 04/13/2022   Cocaine abuse (HCC)    GAD (generalized anxiety disorder) 11/16/2018   Involuntary commitment 11/18/2022   Major depressive disorder 11/16/2018   MDD (major depressive disorder) 12/09/2012   Morbid obesity with BMI of 40.0-44.9, adult (HCC)    Opioid withdrawal (HCC) 09/12/2022   Polysubstance abuse (HCC)    Severe benzodiazepine use disorder (HCC)    Severe opioid use disorder (HCC)    Substance induced mood disorder (HCC)    Tobacco abuse     Allergies Allergies  Allergen Reactions   Robaxin [Methocarbamol] Hives   Blueberry Flavoring Agent (Non-Screening) Rash    IV Location/Drains/Wounds Patient  Lines/Drains/Airways Status     Active Line/Drains/Airways     Name Placement date Placement time Site Days   Peripheral IV 05/01/23 20 G 1" Left Antecubital 05/01/23  2228  Antecubital  less than 1   Urethral Catheter C. Mitzie Na RN Non-latex 04/20/23  1926  Non-latex  11   Incision (Closed) 04/20/23 Abdomen Mid 04/20/23  2130  -- 11   Incision (Closed) 04/20/23 Groin Left 04/20/23  2131  -- 11   Incision (Closed) 04/23/23 Ankle Left 04/23/23  1235  -- 8   Incision (Closed) 04/23/23 Hip Left 04/23/23  1328  -- 8   Incision (Closed) 04/23/23 Elbow Right 04/23/23  1604  -- 8   Wound / Incision (Open or Dehisced) 04/20/23 Laceration Knee Anterior;Right 04/20/23  2200  Knee  11   Wound / Incision (Open or Dehisced) 04/20/23 Abdomen Left;Upper 04/20/23  2200  Abdomen  11   Wound / Incision (Open or Dehisced) 04/20/23 Laceration Hip Anterior;Right 04/20/23  2200  Hip  11            Labs/Imaging Results for orders placed or performed during the hospital encounter of 04/20/23 (from the past 48 hours)  Glucose, capillary     Status: None   Collection Time: 04/30/23 12:24 AM  Result Value Ref Range   Glucose-Capillary 81 70 - 99 mg/dL  Comment: Glucose reference range applies only to samples taken after fasting for at least 8 hours.  Glucose, capillary     Status: Abnormal   Collection Time: 04/30/23  4:00 AM  Result Value Ref Range   Glucose-Capillary 124 (H) 70 - 99 mg/dL    Comment: Glucose reference range applies only to samples taken after fasting for at least 8 hours.  CBC     Status: Abnormal   Collection Time: 04/30/23  7:07 AM  Result Value Ref Range   WBC 4.5 4.0 - 10.5 K/uL   RBC 3.25 (L) 3.87 - 5.11 MIL/uL   Hemoglobin 9.0 (L) 12.0 - 15.0 g/dL   HCT 40.9 (L) 81.1 - 91.4 %   MCV 85.2 80.0 - 100.0 fL   MCH 27.7 26.0 - 34.0 pg   MCHC 32.5 30.0 - 36.0 g/dL   RDW 78.2 (H) 95.6 - 21.3 %   Platelets 331 150 - 400 K/uL   nRBC 0.4 (H) 0.0 - 0.2 %    Comment: Performed at  [facility], [address] [place], [place] [address]  Glucose, capillary     Status: Abnormal   Collection Time: 04/30/23  7:32 AM  Result Value Ref Range   Glucose-Capillary 132 (H) 70 - 99 mg/dL    Comment: Glucose reference range applies only to samples taken after fasting for at least 8 hours.  Glucose, capillary     Status: Abnormal   Collection Time: 04/30/23 12:12 PM  Result Value Ref Range   Glucose-Capillary 144 (H) 70 - 99 mg/dL    Comment: Glucose reference range applies only to samples taken after fasting for at least 8 hours.  Glucose, capillary     Status: Abnormal   Collection Time: 04/30/23  4:41 PM  Result Value Ref Range   Glucose-Capillary 116 (H) 70 - 99 mg/dL    Comment: Glucose reference range applies only to samples taken after fasting for at least 8 hours.  Glucose, capillary     Status: None   Collection Time: 04/30/23  7:58 PM  Result Value Ref Range   Glucose-Capillary 83 70 - 99 mg/dL    Comment: Glucose reference range applies only to samples taken after fasting for at least 8 hours.  Glucose, capillary     Status: Abnormal   Collection Time: 05/01/23 12:13 AM  Result Value Ref Range   Glucose-Capillary 101 (H) 70 - 99 mg/dL    Comment: Glucose reference range applies only to samples taken after fasting for at least 8 hours.  Glucose, capillary     Status: None   Collection Time: 05/01/23  4:54 AM  Result Value Ref Range   Glucose-Capillary 94 70 - 99 mg/dL    Comment: Glucose reference range applies only to samples taken after fasting for at least 8 hours.  Glucose, capillary     Status: Abnormal   Collection Time: 05/01/23  7:42 AM  Result Value Ref Range   Glucose-Capillary 125 (H) 70 - 99 mg/dL    Comment: Glucose reference range applies only to samples taken after fasting for at least 8 hours.  Basic metabolic panel     Status: Abnormal   Collection Time: 05/01/23  9:35 AM  Result Value Ref Range   Sodium 140 135 - 145 mmol/L    Potassium 4.3 3.5 - 5.1 mmol/L   Chloride 101 98 - 111 mmol/L   CO2 28 22 - 32 mmol/L   Glucose, Bld 87 70 - 99  mg/dL    Comment: Glucose reference range applies only to samples taken after fasting for at least 8 hours.   BUN 23 (H) 6 - 20 mg/dL   Creatinine, Ser 6.96 0.44 - 1.00 mg/dL   Calcium 8.8 (L) 8.9 - 10.3 mg/dL   GFR, Estimated >29 >52 mL/min    Comment: (NOTE) Calculated using the CKD-EPI Creatinine Equation (2021)    Anion gap 11 5 - 15    Comment: Performed at [facility], [address] [place], [place] [address]  CBC     Status: Abnormal   Collection Time: 05/01/23  9:35 AM  Result Value Ref Range   WBC 4.4 4.0 - 10.5 K/uL   RBC 3.35 (L) 3.87 - 5.11 MIL/uL   Hemoglobin 9.2 (L) 12.0 - 15.0 g/dL   HCT 44.0 (L) 10.2 - 72.5 %   MCV 88.1 80.0 - 100.0 fL   MCH 27.5 26.0 - 34.0 pg   MCHC 31.2 30.0 - 36.0 g/dL   RDW 36.6 (H) 44.0 - 34.7 %   Platelets 358 150 - 400 K/uL   nRBC 0.0 0.0 - 0.2 %    Comment: Performed at [facility], [address] [place], [place] [address]  Glucose, capillary     Status: None   Collection Time: 05/01/23 12:23 PM  Result Value Ref Range   Glucose-Capillary 96 70 - 99 mg/dL    Comment: Glucose reference range applies only to samples taken after fasting for at least 8 hours.  Urinalysis, Routine w reflex microscopic -Urine, Catheterized; Indwelling urinary catheter     Status: Abnormal   Collection Time: 05/01/23  1:08 PM  Result Value Ref Range   Color, Urine AMBER (A) YELLOW    Comment: BIOCHEMICALS MAY BE AFFECTED BY COLOR   APPearance CLOUDY (A) CLEAR   Specific Gravity, Urine 1.032 (H) 1.005 - 1.030   pH 6.0 5.0 - 8.0   Glucose, UA NEGATIVE NEGATIVE mg/dL   Hgb urine dipstick MODERATE (A) NEGATIVE   Bilirubin Urine NEGATIVE NEGATIVE   Ketones, ur NEGATIVE NEGATIVE mg/dL   Protein, ur 638 (A) NEGATIVE mg/dL   Nitrite NEGATIVE NEGATIVE   Leukocytes,Ua NEGATIVE NEGATIVE   RBC / HPF >50 0 - 5 RBC/hpf   WBC, UA 6-10 0  - 5 WBC/hpf   Bacteria, UA FEW (A) NONE SEEN   Squamous Epithelial / HPF 0-5 0 - 5 /HPF   Mucus PRESENT    Budding Yeast PRESENT    Ca Oxalate Crys, UA PRESENT     Comment: Performed at [facility], [address], [place], [place] [address]   No results found.  Pending Labs Unresulted Labs (From admission, onward)     Start     Ordered   05/02/23 0500  CBC  Tomorrow morning,   R        05/01/23 2150   05/02/23 0500  Basic metabolic panel  Tomorrow morning,   R        05/01/23 2150   05/01/23 2150  HIV Antibody (routine testing w rflx)  (HIV Antibody (Routine testing w reflex) panel)  Once,   R        05/01/23 2150            Vitals/Pain Today's Vitals   05/01/23 1857 05/01/23 1859 05/01/23 2220  BP:  (!) 150/99   Pulse:  (!) 113   Resp:  (!) 22   Temp:  98.7 F (37.1 C)  TempSrc:  Oral   SpO2: 98% 100%   Weight:  104.3 kg   Height:  5\' 6"  (1.676 m)   PainSc:  10-Worst pain ever 8     Isolation Precautions No active isolations  Medications Medications  acetaminophen (TYLENOL) tablet 1,000 mg (has no administration in time range)  docusate sodium (COLACE) capsule 100 mg (has no administration in time range)  polyethylene glycol (MIRALAX / GLYCOLAX) packet 17 g (has no administration in time range)  ondansetron (ZOFRAN-ODT) disintegrating tablet 4 mg (has no administration in time range)    Or  ondansetron (ZOFRAN) injection 4 mg (has no administration in time range)  enoxaparin (LOVENOX) injection 30 mg (has no administration in time range)  oxyCODONE (Oxy IR/ROXICODONE) immediate release tablet 5 mg (has no administration in time range)  oxyCODONE (Oxy IR/ROXICODONE) immediate release tablet 10 mg (10 mg Oral Given 05/01/23 2220)  HYDROmorphone (DILAUDID) injection 0.5 mg (has no administration in time range)  gabapentin (NEURONTIN) capsule 300 mg (300 mg Oral Not Given 05/01/23 2219)    Mobility walks
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
## 2023-05-01 NOTE — Progress Notes (Signed)
 Patient stated "I will skip my scheduled tylenol this time because I have already taken 800mg  of ibuprofen from my mom's purse". I advised the patient that she cannot take any medications outside of what we give her here. Patient stated she did not know that, and that she will not do that again. Advised PA Lanora Manis of this occurrence.

## 2023-05-01 NOTE — ED Notes (Signed)
Pt. Given ice pack. 
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
## 2023-05-01 NOTE — ED Notes (Signed)
[facility] Called

## 2023-05-01 NOTE — Plan of Care (Signed)
  Problem: Clinical Measurements: Goal: Diagnostic test results will improve Outcome: Progressing   Problem: Nutrition: Goal: Adequate nutrition will be maintained Outcome: Progressing   Problem: Coping: Goal: Level of anxiety will decrease Outcome: Progressing   Problem: Skin Integrity: Goal: Risk for impaired skin integrity will decrease Outcome: Progressing   Problem: Health Behavior/Discharge Planning: Goal: Ability to manage health-related needs will improve Outcome: Not Progressing   Problem: Pain Managment: Goal: General experience of comfort will improve and/or be controlled Outcome: Not Progressing

## 2023-05-02 LAB — BASIC METABOLIC PANEL WITH GFR
Anion gap: 7 (ref 5–15)
BUN: 19 mg/dL (ref 6–20)
CO2: 29 mmol/L (ref 22–32)
Calcium: 8.9 mg/dL (ref 8.9–10.3)
Chloride: 103 mmol/L (ref 98–111)
Creatinine, Ser: 0.67 mg/dL (ref 0.44–1.00)
GFR, Estimated: >60 mL/min (ref >60)
Glucose, Bld: 88 mg/dL (ref 70–99)
Potassium: 4.2 mmol/L (ref 3.5–5.1)
Sodium: 139 mmol/L (ref 135–145)

## 2023-05-02 LAB — CBC
HCT: 30 % — ABNORMAL LOW (ref 36.0–46.0)
Hemoglobin: 9.3 g/dL — ABNORMAL LOW (ref 12.0–15.0)
MCH: 27.4 pg (ref 26.0–34.0)
MCHC: 31 g/dL (ref 30.0–36.0)
MCV: 88.5 fL (ref 80.0–100.0)
Platelets: 332 10*3/uL (ref 150–400)
RBC: 3.39 MIL/uL — ABNORMAL LOW (ref 3.87–5.11)
RDW: 17.2 % — ABNORMAL HIGH (ref 11.5–15.5)
WBC: 4.6 10*3/uL (ref 4.0–10.5)
nRBC: 0.4 % — ABNORMAL HIGH (ref 0.0–0.2)

## 2023-05-02 LAB — HIV ANTIBODY (ROUTINE TESTING W REFLEX): HIV Screen 4th Generation wRfx: NONREACTIVE

## 2023-05-02 MED ORDER — NICOTINE 14 MG/24HR TD PT24
14.0000 mg | MEDICATED_PATCH | Freq: Every day | TRANSDERMAL | Status: DC
  Filled 2023-05-02 (×2): qty 1

## 2023-05-02 MED ORDER — BUSPIRONE HCL 5 MG PO TABS
10.0000 mg | ORAL_TABLET | Freq: Three times a day (TID) | ORAL | Status: DC
  Administered 2023-05-02 – 2023-05-07 (×15): 10 mg via ORAL
  Filled 2023-05-02 (×17): qty 2

## 2023-05-02 MED ORDER — KETOROLAC TROMETHAMINE 15 MG/ML IJ SOLN: 30.0000 mg | Freq: Four times a day (QID) | INTRAMUSCULAR | Status: DC

## 2023-05-02 MED ORDER — ENOXAPARIN SODIUM 40 MG/0.4ML IJ SOSY
40.0000 mg | PREFILLED_SYRINGE | Freq: Two times a day (BID) | INTRAMUSCULAR | Status: DC
  Administered 2023-05-02 – 2023-05-07 (×11): 40 mg via SUBCUTANEOUS
  Filled 2023-05-02 (×11): qty 0.4

## 2023-05-02 MED ORDER — CLONAZEPAM 0.5 MG PO TABS
0.5000 mg | ORAL_TABLET | Freq: Two times a day (BID) | ORAL | Status: DC
  Filled 2023-05-02: qty 1

## 2023-05-02 MED ORDER — HYDROMORPHONE HCL 1 MG/ML IJ SOLN
0.5000 mg | Freq: Four times a day (QID) | INTRAMUSCULAR | Status: DC | PRN
  Administered 2023-05-03: 0.5 mg via INTRAVENOUS
  Filled 2023-05-02: qty 0.5

## 2023-05-02 MED ORDER — CYCLOBENZAPRINE HCL 10 MG PO TABS
10.0000 mg | ORAL_TABLET | Freq: Three times a day (TID) | ORAL | Status: DC
  Administered 2023-05-02 – 2023-05-07 (×17): 10 mg via ORAL
  Filled 2023-05-02 (×17): qty 1

## 2023-05-02 MED ORDER — BACITRACIN ZINC 500 UNIT/GM EX OINT
TOPICAL_OINTMENT | Freq: Two times a day (BID) | CUTANEOUS | Status: DC
  Administered 2023-05-02 – 2023-05-05 (×7): 31.5 via TOPICAL
  Administered 2023-05-06: 1 via TOPICAL
  Administered 2023-05-06 – 2023-05-07 (×2): 31.5 via TOPICAL
  Filled 2023-05-02 (×6): qty 28.35

## 2023-05-02 MED ORDER — OXYCODONE HCL ER 10 MG PO T12A
20.0000 mg | EXTENDED_RELEASE_TABLET | Freq: Two times a day (BID) | ORAL | Status: DC
  Administered 2023-05-02 – 2023-05-03 (×3): 20 mg via ORAL
  Filled 2023-05-02 (×3): qty 2

## 2023-05-02 MED ORDER — OXYBUTYNIN CHLORIDE 5 MG PO TABS
5.0000 mg | ORAL_TABLET | Freq: Three times a day (TID) | ORAL | Status: DC
  Administered 2023-05-02 – 2023-05-07 (×16): 5 mg via ORAL
  Filled 2023-05-02 (×18): qty 1

## 2023-05-02 MED ORDER — DULOXETINE HCL 30 MG PO CPEP
30.0000 mg | ORAL_CAPSULE | Freq: Every day | ORAL | Status: DC
  Administered 2023-05-02 – 2023-05-07 (×6): 30 mg via ORAL
  Filled 2023-05-02 (×6): qty 1

## 2023-05-02 MED ORDER — QUETIAPINE FUMARATE 50 MG PO TABS
100.0000 mg | ORAL_TABLET | Freq: Every day | ORAL | Status: DC
  Administered 2023-05-02 – 2023-05-06 (×5): 100 mg via ORAL
  Filled 2023-05-02 (×5): qty 2

## 2023-05-02 MED ORDER — BUPRENORPHINE HCL 8 MG SL SUBL
8.0000 mg | SUBLINGUAL_TABLET | Freq: Every day | SUBLINGUAL | Status: DC
  Administered 2023-05-02 – 2023-05-07 (×6): 8 mg via SUBLINGUAL
  Filled 2023-05-02 (×6): qty 1

## 2023-05-02 MED ORDER — KETOROLAC TROMETHAMINE 15 MG/ML IJ SOLN
30.0000 mg | Freq: Four times a day (QID) | INTRAMUSCULAR | Status: AC
Start: 2023-05-02 — End: 2023-05-05
  Administered 2023-05-02 – 2023-05-05 (×11): 30 mg via INTRAVENOUS
  Filled 2023-05-02 (×11): qty 2

## 2023-05-02 NOTE — Progress Notes (Signed)
 As nursing leader on call for the weekend, I was contacted by Dr. Bedelia Person regarding patient/family request to speak to an administrator in reference to vaping policy. I called the patient to review our "Tobacco and Vape Free Environment" policy with patient, during our conversation and after I reiterated vaping was prohibited and that Dr. Bedelia Person was in fact correct on that policy the patient proceeded to hang up on me. [name], and Dr. Bedelia Person updated, care team will continue to monitor and Dr. Bedelia Person to evaluate for lifting visitor restrictions on Monday (05/04/2023) however at this time due to family members continuing to provide patient access to vapes on multiple accounts, visitation is limited/restricted per MD at this time.
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
## 2023-05-02 NOTE — Plan of Care (Signed)

## 2023-05-02 NOTE — Plan of Care (Signed)

## 2023-05-02 NOTE — Progress Notes (Signed)
 Received a call from a family Crista Danford regarding No visitation policy on this patient. Per family they have allowed 2 family member from last night to stay with the patient.  I have told the family that I would clarify orders from the MD and will call back the family. Md was doing rounds on the unit and this Clinical research associate verified the visitation limitation on this patient, MD verified the order that patient cannot have any visitor, no exemption . Per MD multiple reports regarding vaping was noted on this patient. This Clinical research associate called the family member Jilda Roche that per MD we are not allowing visitor. Family demand a written policy to be emailed to her.  AC was called and made aware of family request, family phone number was given to [facility]. Family called the unit again and this writer said [facility] will give her a call and she hang up. Per Ac , she called the phone number [phone] but no answer and voicemail is not set up to receive any voicemail.

## 2023-05-02 NOTE — Progress Notes (Signed)
 Washed Pt's wounds and incisions with sterile water and soap. Placed foam dressings and Bactroban on them as well. There was an incision on the right side of the abdomen that still had a suture in it and was dehisced with serosanguineous discharge. There was also a wound on the lower right side of the abdomen that had purulent discharge.I placed a wound consult for the dehisced wound.
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
## 2023-05-03 ENCOUNTER — Encounter (HOSPITAL_COMMUNITY): Payer: Self-pay

## 2023-05-03 DIAGNOSIS — F430 Acute stress reaction: Secondary | ICD-10-CM | POA: Diagnosis not present

## 2023-05-03 LAB — URINE CULTURE: Culture: NO GROWTH

## 2023-05-03 MED ORDER — HYDROMORPHONE HCL 1 MG/ML IJ SOLN: 0.2500 mg | Freq: Four times a day (QID) | INTRAMUSCULAR | Status: DC | PRN

## 2023-05-03 MED ORDER — CHLORHEXIDINE GLUCONATE CLOTH 2 % EX PADS
6.0000 | MEDICATED_PAD | Freq: Every day | CUTANEOUS | Status: DC
  Administered 2023-05-03 – 2023-05-06 (×4): 6 via TOPICAL

## 2023-05-03 MED ORDER — OXYCODONE HCL ER 10 MG PO T12A
10.0000 mg | EXTENDED_RELEASE_TABLET | Freq: Two times a day (BID) | ORAL | Status: DC
  Administered 2023-05-03 – 2023-05-04 (×2): 10 mg via ORAL
  Filled 2023-05-03 (×2): qty 1

## 2023-05-03 MED ORDER — ENSURE ENLIVE PO LIQD
237.0000 mL | Freq: Two times a day (BID) | ORAL | Status: DC
  Administered 2023-05-03 – 2023-05-05 (×5): 237 mL via ORAL

## 2023-05-03 MED ORDER — LACTATED RINGERS IV BOLUS
1000.0000 mL | Freq: Once | INTRAVENOUS | Status: AC
  Administered 2023-05-03: 1000 mL via INTRAVENOUS

## 2023-05-03 NOTE — Plan of Care (Signed)
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
## 2023-05-03 NOTE — Consult Note (Signed)
 WOC Nurse Consult Note: Reason for Consult: drain site Wound type: drain site Pressure Injury POA: NA Measurement: see nursing flow sheet Wound bed: see nursing flow sheet Drainage (amount, consistency, odor) per nursing notes, purulent Periwound: see nursing flow sheets Dressing procedure/placement/frequency:  Cleanse wound on the lower right side of the abdomen with saline, pat dry. Cut a strip of silver hydrofiber  [id number].  tuck into drain site, as a wick for drainage, top with foam. Change daily   Alexiah Koroma [facility], CNS, CWON-AP [phone]

## 2023-05-03 NOTE — Progress Notes (Signed)
 OT Cancellation Note  Patient Details Name: [name] MRN: [id number] DOB: 09-16-1995   Cancelled Treatment:    Reason Eval/Treat Not Completed: Patient declined, no reason specified OT greeting patient, and introducing self and role. Patient stating, "I have made this abundantly clear. I do not want to work with therapy, and I do not want you checking on me anymore."  OT confirming that this means that both disciplines should sign off (OT and PT), with patient agreeing. OT will sign off at this time.   Pollyann Glen E. Basir Niven, OTR/L Acute Rehabilitation Services [phone]   Cherlyn Cushing 05/03/2023, 9:32 AM

## 2023-05-03 NOTE — Progress Notes (Addendum)
 PT Cancellation Note and Discharge  Patient Details Name: [name] MRN: [id number] DOB: 21-Mar-1995   Cancelled Treatment:    Reason Eval/Treat Not Completed: Other (comment). Pt stating to OT "I have made this abundantly clear. I do not want to work with therapy, and I do not want you checking on me anymore." PT signing off.   Ilda Foil 05/03/2023, 9:27 AM

## 2023-05-03 NOTE — Progress Notes (Signed)
 Patients Toradol order expired, made Pt aware and sent message to provider.

## 2023-05-03 NOTE — Plan of Care (Signed)
  Problem: Education: Goal: Knowledge of General Education information will improve Description: Including pain rating scale, medication(s)/side effects and non-pharmacologic comfort measures Outcome: Progressing   Problem: Clinical Measurements: Goal: Ability to maintain clinical measurements within normal limits will improve Outcome: Progressing   Problem: Activity: Goal: Risk for activity intolerance will decrease Outcome: Progressing   Problem: Nutrition: Goal: Adequate nutrition will be maintained Outcome: Progressing   Problem: Coping: Goal: Level of anxiety will decrease Outcome: Progressing   Problem: Pain Managment: Goal: General experience of comfort will improve and/or be controlled Outcome: Progressing
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
## 2023-05-04 ENCOUNTER — Inpatient Hospital Stay (HOSPITAL_COMMUNITY): Payer: MEDICAID

## 2023-05-04 MED ORDER — IOHEXOL 300 MG/ML  SOLN
50.0000 mL | Freq: Once | INTRAMUSCULAR | Status: AC | PRN
Start: 2023-05-04 — End: 2023-05-04
  Administered 2023-05-04: 50 mL

## 2023-05-04 NOTE — Progress Notes (Signed)
 cleanse wound on the lower right side of the abdomen with saline, pat dry. Cut a strip of silver hydrofiber  [id number].  tuck into drain site, as a wick for drainage, top with foam. Change daily are the orders from the wound care consult. The items needed to be ordered will have this done on 4/7.
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
## 2023-05-04 NOTE — Progress Notes (Signed)
 Orthopedic Tech Progress Note Patient Details:  CLAYTON JARMON Apr 20, 1995 [id number]  Sling for RUE is with pt belonging's at bedside. Pt does not want the sling applied at this time.  Ortho Devices Type of Ortho Device: Arm sling Ortho Device/Splint Location: For RUE Ortho Device/Splint Interventions: Ordered   Post Interventions Instructions Provided: Care of device, Adjustment of device  Lenisha Lacap Carmine Savoy 05/04/2023, 4:55 PM
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
## 2023-05-04 NOTE — Plan of Care (Signed)
  Problem: Education: Goal: Knowledge of General Education information will improve Description: Including pain rating scale, medication(s)/side effects and non-pharmacologic comfort measures Outcome: Not Progressing   Problem: Activity: Goal: Risk for activity intolerance will decrease Outcome: Not Progressing   Problem: Nutrition: Goal: Adequate nutrition will be maintained Outcome: Not Progressing   Problem: Coping: Goal: Level of anxiety will decrease Outcome: Not Progressing   Problem: Pain Managment: Goal: General experience of comfort will improve and/or be controlled Outcome: Not Progressing   Problem: Skin Integrity: Goal: Risk for impaired skin integrity will decrease Outcome: Not Progressing

## 2023-05-04 NOTE — Progress Notes (Signed)
 Removed patients staples, all were accounted for and it was washed with sterile wipe.
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
## 2023-05-04 NOTE — Discharge Instructions (Signed)
 Orthopaedic Trauma Service Discharge Instructions   General Discharge Instructions  WEIGHT BEARING STATUS:Weightbearing as tolerated right leg. Non-weightbearing left leg. Non-weightbearing right arm  RANGE OF MOTION/ACTIVITY:  Left leg - Ok for knee and hip motion as tolerated. OK to come out of black CAM boot to start gentle ankle motion as tolerated Right arm - Ok for elbow and shoulder motion as tolerated  Wound Care: Incision to left ankle, left hip and right arm may be left open to air. Once the incision is completely dry and without drainage, it may be left open to air out.   DVT/PE prophylaxis: Eliquis 2.5 mg twice daily x 30 days  Diet: as you were eating previously.  Can use over the counter stool softeners and bowel preparations, such as Miralax, to help with bowel movements.  Narcotics can be constipating.  Be sure to drink plenty of fluids  PAIN MEDICATION USE AND EXPECTATIONS  You have likely been given narcotic medications to help control your pain.  After a traumatic event that results in an fracture (broken bone) with or without surgery, it is ok to use narcotic pain medications to help control one's pain.  We understand that everyone responds to pain differently and each individual patient will be evaluated on a regular basis for the continued need for narcotic medications. Ideally, narcotic medication use should last no more than 6-8 weeks (coinciding with fracture healing).   As a patient it is your responsibility as well to monitor narcotic medication use and report the amount and frequency you use these medications when you come to your office visit.   We would also advise that if you are using narcotic medications, you should take a dose prior to therapy to maximize you participation.  IF YOU ARE ON NARCOTIC MEDICATIONS IT IS NOT PERMISSIBLE TO OPERATE A MOTOR VEHICLE (MOTORCYCLE/CAR/TRUCK/MOPED) OR HEAVY MACHINERY DO NOT MIX NARCOTICS WITH OTHER CNS (CENTRAL NERVOUS  SYSTEM) DEPRESSANTS SUCH AS ALCOHOL  POST-OPERATIVE OPIOID TAPER INSTRUCTIONS: It is important to wean off of your opioid medication as soon as possible. If you do not need pain medication after your surgery it is ok to stop day one. Opioids include: Codeine, Hydrocodone(Norco, Vicodin), Oxycodone(Percocet, oxycontin) and hydromorphone amongst others.  Long term and even short term use of opiods can cause: Increased pain response Dependence Constipation Depression Respiratory depression And more.  Withdrawal symptoms can include Flu like symptoms Nausea, vomiting And more Techniques to manage these symptoms Hydrate well Eat regular healthy meals Stay active Use relaxation techniques(deep breathing, meditating, yoga) Do Not substitute Alcohol to help with tapering If you have been on opioids for less than two weeks and do not have pain than it is ok to stop all together.  Plan to wean off of opioids This plan should start within one week post op of your fracture surgery  Maintain the same interval or time between taking each dose and first decrease the dose.  Cut the total daily intake of opioids by one tablet each day Next start to increase the time between doses. The last dose that should be eliminated is the evening dose.    STOP SMOKING OR USING NICOTINE PRODUCTS!!!!  As discussed nicotine severely impairs your body's ability to heal surgical and traumatic wounds but also impairs bone healing.  Wounds and bone heal by forming microscopic blood vessels (angiogenesis) and nicotine is a vasoconstrictor (essentially, shrinks blood vessels).  Therefore, if vasoconstriction occurs to these microscopic blood vessels they essentially disappear and are  unable to deliver necessary nutrients to the healing tissue.  This is one modifiable factor that you can do to dramatically increase your chances of healing your injury.  (This means no smoking, no nicotine gum, patches, etc)  DO NOT USE  NONSTEROIDAL ANTI-INFLAMMATORY DRUGS (NSAID'S)  Using products such as Advil (ibuprofen), Aleve (naproxen), Motrin (ibuprofen) for additional pain control during fracture healing can delay and/or prevent the healing response.  If you would like to take over the counter (OTC) medication, Tylenol (acetaminophen) is ok.  However, some narcotic medications that are given for pain control contain acetaminophen as well. Therefore, you should not exceed more than 4000 mg of tylenol in a day if you do not have liver disease.  Also note that there are may OTC medicines, such as cold medicines and allergy medicines that my contain tylenol as well.  If you have any questions about medications and/or interactions please ask your doctor/PA or your pharmacist.      ICE AND ELEVATE INJURED/OPERATIVE EXTREMITY  Using ice and elevating the injured extremity above your heart can help with swelling and pain control.  Icing in a pulsatile fashion, such as 20 minutes on and 20 minutes off, can be followed.    Do not place ice directly on skin. Make sure there is a barrier between to skin and the ice pack.    Using frozen items such as frozen peas works well as the conform nicely to the are that needs to be iced.  USE AN ACE WRAP OR TED HOSE FOR SWELLING CONTROL  In addition to icing and elevation, Ace wraps or TED hose are used to help limit and resolve swelling.  It is recommended to use Ace wraps or TED hose until you are informed to stop.    When using Ace Wraps start the wrapping distally (farthest away from the body) and wrap proximally (closer to the body)   Example: If you had surgery on your leg or thing and you do not have a splint on, start the ace wrap at the toes and work your way up to the thigh        If you had surgery on your upper extremity and do not have a splint on, start the ace wrap at your fingers and work your way up to the upper arm  IF YOU ARE IN A CAM BOOT (BLACK BOOT)  You may remove boot  periodically. Perform daily dressing changes as noted below.  Wash the liner of the boot regularly and wear a sock when wearing the boot. It is recommended that you sleep in the boot until told otherwise   CALL THE OFFICE FOR MEDICATION REFILLS OR WITH ANY QUESTIONS/CONCERNS: [phone]   VISIT OUR WEBSITE FOR ADDITIONAL INFORMATION: [email or web address]  Discharge Wound Care Instructions  Do NOT apply any ointments, solutions or lotions to pin sites or surgical wounds.  These prevent needed drainage and even though solutions like hydrogen peroxide kill bacteria, they also damage cells lining the pin sites that help fight infection.  Applying lotions or ointments can keep the wounds moist and can cause them to breakdown and open up as well. This can increase the risk for infection. When in doubt call the office.  Surgical incisions should be dressed daily.  If any drainage is noted, use one layer of adaptic or Mepitel, then gauze, Kerlix, and an ace wrap. - These dressing supplies should be available at local medical supply stores [facility], [facility], etc) as well  as local pharmacy ([facility], [facility], [facility], etc)  Once the incision is completely dry and without drainage, it may be left open to air out.  Showering may begin 36-48 hours later.  Cleaning gently with soap and water.  Traumatic wounds should be dressed daily as well.    One layer of adaptic, gauze, Kerlix, then ace wrap.  The adaptic can be discontinued once the draining has ceased    If you have a wet to dry dressing: wet the gauze with saline the squeeze as much saline out so the gauze is moist (not soaking wet), place moistened gauze over wound, then place a dry gauze over the moist one, followed by Kerlix wrap, then ace wrap.    Call office for the following: Temperature greater than 101F Persistent nausea and vomiting Severe uncontrolled pain Redness, tenderness, or signs of infection (pain, swelling,  redness, odor or green/yellow discharge around the site) Difficulty breathing, headache or visual disturbances Hives Persistent dizziness or light-headedness Extreme fatigue Any other questions or concerns you may have after discharge  In an emergency, call 911 or go to an Emergency Department at a nearby hospital  OTHER HELPFUL INFORMATION  If you had a block, it will wear off between 8-24 hrs postop typically.  This is period when your pain may go from nearly zero to the pain you would have had postop without the block.  This is an abrupt transition but nothing dangerous is happening.  You may take an extra dose of narcotic when this happens.  You should wean off your narcotic medicines as soon as you are able.  Most patients will be off or using minimal narcotics before their first postop appointment.   We suggest you use the pain medication the first night prior to going to bed, in order to ease any pain when the anesthesia wears off. You should avoid taking pain medications on an empty stomach as it will make you nauseous.  Do not drink alcoholic beverages or take illicit drugs when taking pain medications.  In most states it is against the law to drive while you are in a splint or sling.  And certainly against the law to drive while taking narcotics.  You may return to work/school in the next couple of days when you feel up to it.   Pain medication may make you constipated.  Below are a few solutions to try in this order: Decrease the amount of pain medication if you aren't having pain. Drink lots of decaffeinated fluids. Drink prune juice and/or each dried prunes  If the first 3 don't work start with additional solutions Take Colace - an over-the-counter stool softener Take Senokot - an over-the-counter laxative Take Miralax - a stronger over-the-counter  laxative    __________________________________________________________________________________________________________________________________________   PTSD Brochure [email or web address]  [facility]:  Address: [address], [place], [place] [address] Hours:  Closes 5?PM Phone: [phone]  [facility] [place]:  Located in: [facility] Address: [address], [place], [place] [address] Areas served:  [employer]:  Closes 8?PM  Phone: [phone]  Trauma Focused Therapist(s):   Dannielle Karvonen [address] [place], [place] [address]  [phone]  Jerelyn Charles [facility] [place], [place] [address]  [email or web address]  [facility] Health Counselor, [place], [facility], [facility], [facility] [phone] and Pamala Duffel [place], [place] [address] [phone]  [facility] [address]  [address] [place], [place] [address]  [phone]   [facility] [address] [place], [place] [address] [place] Phone: [phone]  Trauma Support Groups [facility] [address] [place], [place] [address]   [facility] [address] [place]   Phone: [phone]  [facility]  [phone] [address] [address] [place], [place] [address]  [facility]Emotional Abuse Survivor) Lind Guest [phone]  The [address] [place], [place] [address]  [phone]  [facility] [address] [place] [address] [phone]  Suicide and Crisis Lifeline 988 http://russell.net/  [facility] Website: [email or web address]/   24-Hour Call Center: 8142653445  Behavioral Health Crisis Line: [phone]   [facility]  Website: TripDoors.com.cy  Address: [address] [place], [place], [place] [address]  Contact: [phone]  [facility] [place] Website: [email or web address]  Address: [address] [place], [place] [address]  Contact: [phone]    ____________________________________________________________________________________________________________________  [facility], [place] [phone]  OPEN ABDOMINAL SURGERY: POST OP INSTRUCTIONS  Always review your discharge instruction sheet given to you by the facility where your surgery was performed.  IF YOU HAVE DISABILITY OR FAMILY LEAVE FORMS, YOU MUST BRING THEM TO THE OFFICE FOR PROCESSING.  PLEASE DO NOT GIVE THEM TO YOUR DOCTOR.  A prescription for pain medication may be given to you upon discharge.  Take your pain medication as prescribed, if needed.  If narcotic pain medicine is not needed, then you may take acetaminophen (Tylenol) or ibuprofen (Advil) as needed. Take your usually prescribed medications unless otherwise directed. If you need a refill on your pain medication, please contact your pharmacy. They will contact our office to request authorization.  Prescriptions will not be filled after 5pm or on week-ends. You should follow a light diet the first few days after arrival home, such as soup and crackers, pudding, etc.unless your doctor has advised otherwise. A high-fiber, low fat diet can be resumed as tolerated.   Be sure to include lots of fluids daily. Most patients will experience some swelling and bruising on the chest and neck area.  Ice packs will help.  Swelling and bruising can take several days to  resolve Most patients will experience some swelling and bruising in the area of the incision. Ice pack will help. Swelling and bruising can take several days to resolve..  It is common to experience some constipation if taking pain medication after surgery.  Increasing fluid intake and taking a stool softener will usually help or prevent this problem from occurring.  A mild laxative (Milk of Magnesia or Miralax) should be taken according to package directions if there are no bowel movements after 48 hours.  You may have steri-strips (small skin tapes) in place directly over the incision.  These strips should be left on the skin for 7-10 days.  If your surgeon used skin glue on the incision, you may shower in 24 hours.  The glue will flake off over the next 2-3 weeks.  Any sutures or staples will be removed at the office during your follow-up visit. You may find that a light gauze bandage over your incision may keep your staples from being rubbed or pulled. You may shower and replace the bandage daily. ACTIVITIES:  You may resume regular (light) daily activities beginning the next day--such as  daily self-care, walking, climbing stairs--gradually increasing activities as tolerated.  You may have sexual intercourse when it is comfortable.  Refrain from any heavy lifting or straining until approved by your doctor. You may drive when you no longer are taking prescription pain medication, you can comfortably wear a seatbelt, and you can safely maneuver your car and apply brakes Return to Work: ___________________________________ Bonita Quin should see your doctor in the office for a follow-up appointment approximately two weeks after your surgery.  Make sure that you call for this appointment within a day or two after you arrive home to insure a convenient appointment time. OTHER INSTRUCTIONS:   _____________________________________________________________ _____________________________________________________________  WHEN TO CALL YOUR DOCTOR: Fever over 101.0 Inability to urinate Nausea and/or vomiting Extreme swelling or bruising Continued bleeding from incision. Increased pain, redness, or drainage from the incision. Difficulty swallowing or breathing Muscle cramping or spasms. Numbness or tingling in hands or feet or around lips.  The clinic staff is available to answer your questions during regular business hours.  Please don't hesitate to call and ask to speak to one of the nurses if you have concerns.  For further questions, please visit [email or web address]   Information on my medicine - ELIQUIS (apixaban)  This medication education was reviewed with me or my healthcare representative as part of my discharge preparation.    Why was Eliquis prescribed for you? Eliquis was prescribed for you to reduce the risk of blood clots forming after orthopedic surgery.    What do You need to know about Eliquis? Take your Eliquis TWICE DAILY - one tablet in the morning and one tablet in the evening with or without food.  It would be best to take the dose about the same time each day.  If you have difficulty swallowing the tablet whole please discuss with your pharmacist how to take the medication safely.  Take Eliquis exactly as prescribed by your doctor and DO NOT stop taking Eliquis without talking to the doctor who prescribed the medication.  Stopping without other medication to take the place of Eliquis may increase your risk of developing a clot.  After discharge, you should have regular check-up appointments with your healthcare provider that is prescribing your Eliquis.  What do you do if you miss a dose? If a dose of ELIQUIS is not taken at the scheduled time, take it as soon as possible on the same day and twice-daily administration should be  resumed.  The dose should not be doubled to make up for a missed dose.  Do not take more than one tablet of ELIQUIS at the same time.  Important Safety Information A possible side effect of Eliquis is bleeding. You should call your healthcare provider right away if you experience any of the following: Bleeding from an injury or your nose that does not stop. Unusual colored urine (red or dark brown) or unusual colored stools (red or black). Unusual bruising for unknown reasons. A serious fall or if you hit your head (even if there is no bleeding).  Some medicines may interact with Eliquis and might increase your risk of bleeding or clotting while on Eliquis. To help avoid this, consult your healthcare provider or pharmacist prior to using any new prescription or non-prescription medications, including herbals, vitamins, non-steroidal anti-inflammatory drugs (NSAIDs) and supplements.  This website has more information on Eliquis (apixaban): http://www.eliquis.com/eliquis/home

## 2023-05-05 ENCOUNTER — Inpatient Hospital Stay (HOSPITAL_COMMUNITY): Payer: MEDICAID

## 2023-05-05 LAB — CBC
HCT: 25.7 % — ABNORMAL LOW (ref 36.0–46.0)
Hemoglobin: 7.9 g/dL — ABNORMAL LOW (ref 12.0–15.0)
MCH: 26.6 pg (ref 26.0–34.0)
MCHC: 30.7 g/dL (ref 30.0–36.0)
MCV: 86.5 fL (ref 80.0–100.0)
Platelets: 319 10*3/uL (ref 150–400)
RBC: 2.97 MIL/uL — ABNORMAL LOW (ref 3.87–5.11)
RDW: 17 % — ABNORMAL HIGH (ref 11.5–15.5)
WBC: 5.2 10*3/uL (ref 4.0–10.5)
nRBC: 0 % (ref 0.0–0.2)

## 2023-05-05 LAB — HCG, SERUM, QUALITATIVE: Preg, Serum: NEGATIVE
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
## 2023-05-05 NOTE — Progress Notes (Signed)
 Foley removed per order. Pt tolerated well.
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
## 2023-05-05 NOTE — Progress Notes (Signed)
 Orthopaedic Trauma Progress Note  SUBJECTIVE: Doing ok today. Provided patient with sling yesterday afternoon to help her avoid suing the right arm but she refused to wear it. Otherwise no new issues. Wants to go home. Patient's sister asleep at bedside.   OBJECTIVE:  Vitals:   05/05/23 0442 05/05/23 0910  BP: (!) 102/50 (!) 101/56  Pulse: (!) 104 95  Resp: 18 18  Temp: 98.4 F (36.9 C) 98 F (36.7 C)  SpO2: 92% (!) 88%    General: Sitting up in bed, NAD Left lower extremity: Sutures left groin removed. Short leg splint in place. Endorses sensation above splint and to light touch of the toes. Able to wiggle toes. Foot warm and well perfused Right upper extremity: Incision over elbow stable. Sutures removed. Swelling though the forearm, wrist, hand improving. Tolerates gentle elbow ROM, not able to get full extension. Painless wrist ROM. Fingers warm and well-perfused.  Endorses sensation to all aspects of the hand. +radial pulse.   IMAGING: Repeat imaging of pelvis, left ankle, right elbow today  LABS:  Results for orders placed or performed during the hospital encounter of 05/01/23 (from the past 24 hours)  CBC     Status: Abnormal   Collection Time: 05/05/23 10:22 AM  Result Value Ref Range   WBC 5.2 4.0 - 10.5 K/uL   RBC 2.97 (L) 3.87 - 5.11 MIL/uL   Hemoglobin 7.9 (L) 12.0 - 15.0 g/dL   HCT 43.3 (L) 29.5 - 18.8 %   MCV 86.5 80.0 - 100.0 fL   MCH 26.6 26.0 - 34.0 pg   MCHC 30.7 30.0 - 36.0 g/dL   RDW 41.6 (H) 60.6 - 30.1 %   Platelets 319 150 - 400 K/uL   nRBC 0.0 0.0 - 0.2 %    ASSESSMENT: [name] is a 28 y.o. female,   s/p IRRIGATION AND DEBRIDEMENT OF PELVIS AND CLOSURE OF HIP WOUND. OPEN DUCTION INTERNAL FIXATION LEFT PELVIS  OPEN REDUCTION INTERNAL FIXATION RIGHT SUPRACONDYLAR/INTERCONDYLAR DISTAL HUMERUS FRACTURE  OPEN REDUCTION INTERNAL FIXATION LEFT TRIMALLEOLAR ANKLE FRACTURE WITH REPAIR OF SYNDESMOSIS   CV/Blood loss: Acute blood loss anemia,  hemoglobin 7.9 today. Hemodynamically stable    PLAN: Weightbearing: NWB LLE, NWB RUE ROM:  LLE - ok for hip and knee ROM as tolerated.  OK to begin gentle ankle ROM RUE - ok for gentle elbow ROM as tolerated Incisional and dressing care: OK to leave incisions open to air Showering: OK to get incisions wet Orthopedic device(s): Splint removed, transition to CAM boot LLE Pain management: Per trauma team VTE prophylaxis: Lovenox.  SCDs ID: Ancef post op per open fracture protocol completed Foley/Lines: Foley in place.  KVO IVFs Medical co-morbidities: Opioid use disorder. Obesity Class III (based on BMI of 41.53 kg/m) Impediments to Fracture Healing: Polytrauma.  Vitamin D level 15, started on supplementation Dispo: PT/OT eval ongoing, patient not wanting to participate. Ok for d/c from ortho standpoint.  Follow - up plan: Will continue to follow patient while in hospital and plan for outpatient follow-up 2 weeks after discharge  D/C recommendations: - Eliquis 2.5 mg BID x 30 days for DVT prophylaxis - Continue 2,000 units Vit D3 supplementation BID x 90 days   Contact information:  Truitt Merle MD, Thyra Breed PA-C. After hours and holidays please check [email or web address] for group call information for [facility]   Thompson Caul, PA-C [phone] (office) [email or web address]
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
## 2023-05-05 NOTE — Plan of Care (Signed)
  Problem: Education: Goal: Knowledge of General Education information will improve Description: Including pain rating scale, medication(s)/side effects and non-pharmacologic comfort measures Outcome: Progressing   Problem: Clinical Measurements: Goal: Will remain free from infection Outcome: Progressing Goal: Diagnostic test results will improve Outcome: Progressing   Problem: Health Behavior/Discharge Planning: Goal: Ability to manage health-related needs will improve Outcome: Not Progressing
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
## 2023-05-05 NOTE — Progress Notes (Signed)
 Orthopedic Tech Progress Note Patient Details:  [name] 16-Nov-1995 [id number]  Ortho Devices Type of Ortho Device: CAM walker Ortho Device/Splint Location: LLE Ortho Device/Splint Interventions: Ordered, Application   Post Interventions Patient Tolerated: Well Instructions Provided: Care of device, Adjustment of device  [name] 05/05/2023, 1:28 PM
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
## 2023-05-06 ENCOUNTER — Telehealth (HOSPITAL_COMMUNITY): Payer: Self-pay | Admitting: Pharmacy Technician

## 2023-05-06 ENCOUNTER — Other Ambulatory Visit (HOSPITAL_COMMUNITY): Payer: Self-pay

## 2023-05-06 MED ORDER — OXYCODONE HCL 5 MG PO TABS
5.0000 mg | ORAL_TABLET | ORAL | 0 refills | Status: DC | PRN
  Filled 2023-05-06: qty 30, 5d supply, fill #0

## 2023-05-06 MED ORDER — DOCUSATE SODIUM 100 MG PO CAPS
100.0000 mg | ORAL_CAPSULE | Freq: Two times a day (BID) | ORAL | 0 refills | Status: DC
  Filled 2023-05-06: qty 10, 5d supply, fill #0

## 2023-05-06 MED ORDER — CYCLOBENZAPRINE HCL 10 MG PO TABS
10.0000 mg | ORAL_TABLET | Freq: Three times a day (TID) | ORAL | 0 refills | Status: DC
  Filled 2023-05-06: qty 30, 10d supply, fill #0

## 2023-05-06 MED ORDER — VITAMIN D 25 MCG (1000 UNIT) PO TABS
2000.0000 [IU] | ORAL_TABLET | Freq: Two times a day (BID) | ORAL | Status: DC
  Administered 2023-05-06 – 2023-05-07 (×3): 2000 [IU] via ORAL
  Filled 2023-05-06 (×3): qty 2

## 2023-05-06 MED ORDER — POLYETHYLENE GLYCOL 3350 17 GM/SCOOP PO POWD
17.0000 g | Freq: Every day | ORAL | 0 refills | Status: DC | PRN
  Filled 2023-05-06: qty 238, 14d supply, fill #0

## 2023-05-06 MED ORDER — BUPRENORPHINE HCL 8 MG SL SUBL
8.0000 mg | SUBLINGUAL_TABLET | Freq: Two times a day (BID) | SUBLINGUAL | 0 refills | Status: DC
  Filled 2023-05-06: qty 60, 30d supply, fill #0

## 2023-05-06 MED ORDER — QUETIAPINE FUMARATE 100 MG PO TABS
100.0000 mg | ORAL_TABLET | Freq: Every day | ORAL | 0 refills | Status: DC
  Filled 2023-05-06: qty 30, 30d supply, fill #0

## 2023-05-06 MED ORDER — GABAPENTIN 300 MG PO CAPS
300.0000 mg | ORAL_CAPSULE | Freq: Three times a day (TID) | ORAL | 0 refills | Status: DC
  Filled 2023-05-06: qty 90, 30d supply, fill #0

## 2023-05-06 MED ORDER — ACETAMINOPHEN 500 MG PO TABS
1000.0000 mg | ORAL_TABLET | Freq: Four times a day (QID) | ORAL | 0 refills | Status: DC
  Filled 2023-05-06: qty 30, 4d supply, fill #0

## 2023-05-06 MED ORDER — VITAMIN D3 25 MCG PO TABS
2000.0000 [IU] | ORAL_TABLET | Freq: Two times a day (BID) | ORAL | 0 refills | Status: DC
  Filled 2023-05-06: qty 180, 45d supply, fill #0

## 2023-05-06 MED ORDER — BACITRACIN ZINC 500 UNIT/GM EX OINT
TOPICAL_OINTMENT | Freq: Two times a day (BID) | CUTANEOUS | 0 refills | Status: DC
  Filled 2023-05-06: qty 28.4, 15d supply, fill #0

## 2023-05-06 MED ORDER — BUSPIRONE HCL 10 MG PO TABS
10.0000 mg | ORAL_TABLET | Freq: Three times a day (TID) | ORAL | 0 refills | Status: AC
  Filled 2023-05-06: qty 90, 30d supply, fill #0

## 2023-05-06 MED ORDER — APIXABAN 2.5 MG PO TABS
2.5000 mg | ORAL_TABLET | Freq: Two times a day (BID) | ORAL | 0 refills | Status: DC
  Filled 2023-05-06: qty 60, 30d supply, fill #0
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
## 2023-05-06 NOTE — Telephone Encounter (Signed)
 Pharmacy Patient Advocate Encounter   Received notification from CoverMyMeds that prior authorization for QUEtiapine Fumarate 100MG  tablets is required/requested.   Insurance verification completed.   The patient is insured through [facility] .   Per test claim: PA required; PA submitted to above mentioned insurance via CoverMyMeds Key/confirmation #/EOC [id number] Status is pending

## 2023-05-06 NOTE — Telephone Encounter (Signed)
 Pharmacy Patient Advocate Encounter  Received notification from [facility] that Prior Authorization for QUEtiapine Fumarate 100MG  tablets  has been APPROVED from 05/06/2023 to 05/05/2024   PA #/Case ID/Reference #: [id number]

## 2023-05-06 NOTE — Progress Notes (Signed)
 Inpatient Rehab Admissions Coordinator:   Noted therapy re-eval with recommendations for CIR.  She was able to stand with PT with mod +2, though declined any out of bed mobility with OT.  She will require significant physical assist at discharge.  We will not pursue CIR at this time as documentation indicates pt prefers discharge home and this is being set up by TOC.     Estill Dooms, PT, DPT Admissions Coordinator [phone] 05/06/23  11:37 AM
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
## 2023-05-06 NOTE — Plan of Care (Signed)
  Problem: Clinical Measurements: Goal: Ability to maintain clinical measurements within normal limits will improve Outcome: Progressing   Problem: Activity: Goal: Risk for activity intolerance will decrease Outcome: Progressing   Problem: Coping: Goal: Level of anxiety will decrease Outcome: Progressing   Problem: Pain Managment: Goal: General experience of comfort will improve and/or be controlled Outcome: Progressing   Problem: Nutrition: Goal: Adequate nutrition will be maintained Outcome: Not Progressing
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
## 2023-05-06 NOTE — Plan of Care (Signed)
   Problem: Education: Goal: Knowledge of General Education information will improve Description Including pain rating scale, medication(s)/side effects and non-pharmacologic comfort measures Outcome: Progressing

## 2023-05-06 NOTE — TOC Progression Note (Addendum)
 Transition of Care [facility]) - Progression Note    Patient Details  Name: [name] MRN: [id number] Date of Birth: 1996-01-01  [facility] CM/SW Contact  Glennon Mac, RN Phone Number: 05/06/2023, 11:50am  Clinical Narrative:    Message left with Vaughan Basta with [employer], requesting ETA on DME delivery.    Addendum: 1:33pm Additional message sent to [employer] requesting ETA on DME delivery; Vaughan Basta states that mother stated that DME will be delivered tomorrow AM, as family making space for hospital bed.  Jermaine with [employer] states delivery driver called mother again to confirm, and she refuses equipment to be delivered today. She agrees to delivery in AM.  Notified attending of delay.     Addendum: 4:05pm Per Vaughan Basta with [employer], he heard the phone call with patient's mother on speaker phone; she said "I don't have the space for the equipment to come today; it will have to come in the morning."   Expected Discharge Plan: Home/Self Care    Expected Discharge Plan and Services         Expected Discharge Date: 05/06/23               DME Arranged: Hospital bed, Wheelchair manual, 3-N-1, Other see comment Michiel Sites lift) DME Agency: [employer] Date DME Agency Contacted: 05/05/23 Time DME Agency Contacted: 1249 Representative spoke with at DME Agency: Shaune Leeks             Social Determinants of Health (SDOH) Interventions SDOH Screenings   Food Insecurity: No Food Insecurity (05/03/2023)  Housing: Low Risk  (05/03/2023)  Transportation Needs: No Transportation Needs (05/03/2023)  Utilities: Not At Risk (05/03/2023)  Social Connections: Patient Unable To Answer (04/21/2023)  Tobacco Use: High Risk (05/03/2023)    Readmission Risk Interventions     No data to display         Quintella Baton, RN, BSN  Trauma/Neuro ICU Case Manager [phone]

## 2023-05-06 NOTE — Progress Notes (Signed)
 Patient has requested PRN pain medication once this shift. She reports generalized pain. Her sister is at her bedside and assists with care needs. Pt will be going home tomorrow and sister will provide care at home. Pt is calm and cooperative with all care. She did refuse colace reporting she had BM 05/05/23. No concerns this shift.

## 2023-05-06 NOTE — TOC Progression Note (Signed)
 Transition of Care [facility]) - Progression Note    Patient Details  Name: [name] MRN: [id number] Date of Birth: 09-Apr-1995  Transition of [facility]) CM/SW Contact  Glennon Mac, RN Phone Number: 05/06/2023, 4:54 PM  Clinical Narrative:    Met with patient and her sister to finalize discharge arrangements. Patient admits that mom did decline DME because they were trying to clean and make room for the hospital bed at her grandmother's home.   [employer] did have DME available for delivery this afternoon.  Discharge address is [address] [place], [place] [address].  Will confirm this address with DME agency.   Will arrange PTAR transport in AM once confirmation of equipment delivery is received.  Explained this to patient/sister and they verbalize understanding.   Expected Discharge Plan: Home/Self Care    Expected Discharge Plan and Services         Expected Discharge Date: 05/06/23               DME Arranged: Hospital bed, Wheelchair manual, 3-N-1, Other see comment Michiel Sites lift) DME Agency: [employer] Date DME Agency Contacted: 05/05/23 Time DME Agency Contacted: 1249 Representative spoke with at DME Agency: Shaune Leeks             Social Determinants of Health (SDOH) Interventions SDOH Screenings   Food Insecurity: No Food Insecurity (05/03/2023)  Housing: Low Risk  (05/03/2023)  Transportation Needs: No Transportation Needs (05/03/2023)  Utilities: Not At Risk (05/03/2023)  Social Connections: Patient Unable To Answer (04/21/2023)  Tobacco Use: High Risk (05/03/2023)    Readmission Risk Interventions     No data to display         Quintella Baton, RN, BSN  Trauma/Neuro ICU Case Manager [phone]
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
## 2023-05-07 ENCOUNTER — Other Ambulatory Visit (HOSPITAL_COMMUNITY): Payer: Self-pay

## 2023-05-07 LAB — CBC
HCT: 25 % — ABNORMAL LOW (ref 36.0–46.0)
Hemoglobin: 7.5 g/dL — ABNORMAL LOW (ref 12.0–15.0)
MCH: 25.8 pg — ABNORMAL LOW (ref 26.0–34.0)
MCHC: 30 g/dL (ref 30.0–36.0)
MCV: 85.9 fL (ref 80.0–100.0)
Platelets: 327 10*3/uL (ref 150–400)
RBC: 2.91 MIL/uL — ABNORMAL LOW (ref 3.87–5.11)
RDW: 17 % — ABNORMAL HIGH (ref 11.5–15.5)
WBC: 3.8 10*3/uL — ABNORMAL LOW (ref 4.0–10.5)
nRBC: 0 % (ref 0.0–0.2)

## 2023-05-07 LAB — PREPARE RBC (CROSSMATCH)

## 2023-05-07 MED ORDER — MAGNESIUM HYDROXIDE 400 MG/5ML PO SUSP
30.0000 mL | Freq: Once | ORAL | Status: DC
  Filled 2023-05-07: qty 30

## 2023-05-07 MED ORDER — OXYCODONE HCL 5 MG PO TABS
10.0000 mg | ORAL_TABLET | Freq: Four times a day (QID) | ORAL | Status: DC | PRN
  Administered 2023-05-07: 10 mg via ORAL
  Filled 2023-05-07: qty 2

## 2023-05-07 MED ORDER — SODIUM CHLORIDE 0.9 % IV BOLUS: 500.0000 mL | Freq: Once | INTRAVENOUS | Status: DC

## 2023-05-07 MED ORDER — OXYCODONE HCL 5 MG PO TABS: 5.0000 mg | ORAL_TABLET | Freq: Four times a day (QID) | ORAL | Status: DC | PRN

## 2023-05-07 MED ORDER — SODIUM CHLORIDE 0.9% IV SOLUTION: Freq: Once | INTRAVENOUS | Status: AC
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
## 2023-05-07 NOTE — Plan of Care (Signed)

## 2023-05-07 NOTE — TOC Transition Note (Signed)
 Transition of Care [facility]) - Discharge Note   Patient Details  Name: [name] MRN: [id number] Date of Birth: April 23, 1995  [facility] of [facility]) CM/SW Contact:  Glennon Mac, RN Phone Number: 05/07/2023, 12:12 PM   Clinical Narrative:    Patient medically stable for discharge home with family to provide needed assistance.  Awaiting delivery of medical equipment to home prior to calling [facility] for transport.  Bedside nurse states she will let me know as soon as she hears that DME is in the home.    Final next level of care: Home/Self Care Barriers to Discharge: Barriers Resolved                         Discharge Plan and Services Additional resources added to the After Visit Summary for  na                DME Arranged: Hospital bed, Wheelchair manual, 3-N-1, Other see comment Michiel Sites lift) DME Agency: [employer] Date DME Agency Contacted: 05/05/23 Time DME Agency Contacted: 1249 Representative spoke with at DME Agency: Shaune Leeks            Social Drivers of Health (SDOH) Interventions SDOH Screenings   Food Insecurity: No Food Insecurity (05/03/2023)  Housing: Low Risk  (05/03/2023)  Transportation Needs: No Transportation Needs (05/03/2023)  Utilities: Not At Risk (05/03/2023)  Social Connections: Patient Unable To Answer (04/21/2023)  Tobacco Use: High Risk (05/03/2023)     Readmission Risk Interventions     No data to display         Quintella Baton, RN, BSN  Trauma/Neuro ICU Case Manager [phone]
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
## 2023-05-07 NOTE — TOC Transition Note (Signed)
 Transition of Care [facility]) - Discharge Note   Patient Details  Name: [name] MRN: [id number] Date of Birth: 02-24-1995  Transition of Care [facility]) CM/SW Contact:  Glennon Mac, RN Phone Number: 05/07/2023, 2:59 PM   Clinical Narrative:    Confirmed that hospital bed and hoyer lift have been delivered to patient's discharge address.  She will receive a blood transfusion prior to dc.  Bedside nurse to call [facility] at [phone] when transfusion complete.  If after 5pm, call [phone].    Discharge address is [address] [place], [place] [address].     Final next level of care: Home/Self Care Barriers to Discharge: Barriers Resolved          Discharge Plan and Services Additional resources added to the After Visit Summary for                  DME Arranged: Hospital bed, Wheelchair manual, 3-N-1, Other see comment Michiel Sites lift) DME Agency: [employer] Date DME Agency Contacted: 05/05/23 Time DME Agency Contacted: 1249 Representative spoke with at DME Agency: Shaune Leeks            Social Drivers of Health (SDOH) Interventions SDOH Screenings   Food Insecurity: No Food Insecurity (05/03/2023)  Housing: Low Risk  (05/03/2023)  Transportation Needs: No Transportation Needs (05/03/2023)  Utilities: Not At Risk (05/03/2023)  Social Connections: Patient Unable To Answer (04/21/2023)  Tobacco Use: High Risk (05/03/2023)     Readmission Risk Interventions     No data to display         Quintella Baton, RN, BSN  Trauma/Neuro ICU Case Manager [phone]

## 2023-05-07 NOTE — Progress Notes (Signed)
 Late Entry:   On 4/6 the patient had a foley cath and the indication for the cath was trauma/acute urinary retention.

## 2023-05-08 ENCOUNTER — Encounter (HOSPITAL_COMMUNITY): Payer: Self-pay | Admitting: Emergency Medicine

## 2023-05-11 LAB — TYPE AND SCREEN
ABO/RH(D): A POS
Antibody Screen: POSITIVE
DAT, IgG: NEGATIVE
Donor AG Type: NEGATIVE
Donor AG Type: NEGATIVE
Unit division: 0
Unit division: 0

## 2023-05-11 LAB — BPAM RBC
Blood Product Expiration Date: 202505012359
Blood Product Expiration Date: 202505062359
ISSUE DATE / TIME: 202504101831
Unit Type and Rh: 5100
Unit Type and Rh: 6200

## 2023-05-19 ENCOUNTER — Ambulatory Visit: Payer: Self-pay | Admitting: Student
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
## 2023-05-19 NOTE — Discharge Summary (Signed)
Patient left AMA  Diamantina Monks, MD General and Trauma Surgery [facility]

## 2023-05-20 ENCOUNTER — Encounter (HOSPITAL_COMMUNITY): Payer: Self-pay | Admitting: Student

## 2023-05-20 ENCOUNTER — Other Ambulatory Visit: Payer: Self-pay

## 2023-05-20 NOTE — Progress Notes (Signed)
 SDW call  Patient was given pre-op instructions over the phone. Patient verbalized understanding of instructions provided.     PCP -  Denies Cardiologist -  Pulmonary:    PPM/ICD - denies Device Orders - na Rep Notified - na   Chest x-ray - 04/25/23 EKG -  10*23/2025 Stress Test - ECHO -  Cardiac Cath -   Sleep Study/sleep apnea/CPAP: denies  Non diabetic  Blood Thinner Instructions: Eliquis : Last dose was suppose to be 05/19/2023, but patient took dose this morning, 05/20/2023 Aspirin Instructions:na   ERAS Protcol - clears until 0430  Anesthesia review: Yes: difficult airway   Patient denies shortness of breath, fever, cough and chest pain over the phone call  Your procedure is scheduled on  Thursday, May 21, 2023  Report to [facility] Main Entrance "A" at  0530  A.M., then check in with the Admitting office.  Call this number if you have problems the morning of surgery:  [phone]   If you have any questions prior to your surgery date call [phone]: Open Monday-Friday 8am-4pm If you experience any cold or flu symptoms such as cough, fever, chills, shortness of breath, etc. between now and your scheduled surgery, please notify us  at the above number     Remember:  Do not eat after midnight the night before your surgery  You may drink clear liquids until  0430   the morning of your surgery.   Clear liquids allowed are: Water , Non-Citrus Juices (without pulp), Carbonated Beverages, Clear Tea, Black Coffee ONLY (NO MILK, CREAM OR POWDERED CREAMER of any kind), and Gatorade   Take these medicines the morning of surgery with A SIP OF WATER :  Augmentin , Subutex , busbar, flexeril , colace, cymbalta , neurontin , atarax ,   As needed: Tylenol , oxycodone   As of today, STOP taking any Aspirin (unless otherwise instructed by your surgeon) Aleve , Naproxen , Ibuprofen , Motrin , Advil , Goody's, BC's, all herbal medications, fish oil, and all vitamins.

## 2023-05-20 NOTE — Anesthesia Preprocedure Evaluation (Signed)
 Anesthesia Evaluation  Patient identified by MRN, date of birth, ID band Patient awake    Reviewed: Allergy & Precautions, NPO status , Patient's Chart, lab work & pertinent test results  History of Anesthesia Complications (+) Emergence Delirium and history of anesthetic complications  Airway Mallampati: II  TM Distance: >3 FB     Dental  (+) Poor Dentition, Dental Advisory Given, Missing, Chipped   Pulmonary Current Smoker and Patient abstained from smoking.   Pulmonary exam normal breath sounds clear to auscultation       Cardiovascular negative cardio ROS Normal cardiovascular exam Rhythm:Regular Rate:Normal     Neuro/Psych  Headaches PSYCHIATRIC DISORDERS Anxiety Depression    ADHD   GI/Hepatic ,GERD  Medicated,,(+)     substance abuse  Hx/o polysubstance abuse- on Suboxone    Endo/Other  negative endocrine ROS    Renal/GU   negative genitourinary   Musculoskeletal  (+)  narcotic dependentInfection left hip   Abdominal  (+) + obese  Peds  Hematology  (+) Blood dyscrasia, anemia Eliquis  therapy- last dose yesterday am   Anesthesia Other Findings   Reproductive/Obstetrics                             Anesthesia Physical Anesthesia Plan  ASA: 3  Anesthesia Plan: General   Post-op Pain Management: Dilaudid  IV, Precedex , Tylenol  PO (pre-op)*, Ketamine  IV* and Lidocaine  infusion*   Induction: Intravenous  PONV Risk Score and Plan: 3 and Treatment may vary due to age or medical condition, Midazolam , Dexamethasone  and Ondansetron   Airway Management Planned: LMA and Oral ETT  Additional Equipment: None  Intra-op Plan:   Post-operative Plan: Extubation in OR  Informed Consent: I have reviewed the patients History and Physical, chart, labs and discussed the procedure including the risks, benefits and alternatives for the proposed anesthesia with the patient or authorized  representative who has indicated his/her understanding and acceptance.     Dental advisory given  Plan Discussed with: Anesthesiologist and CRNA  Anesthesia Plan Comments:        Anesthesia Quick Evaluation

## 2023-05-21 ENCOUNTER — Encounter (HOSPITAL_COMMUNITY)
Admission: RE | Disposition: A | Discharge status: Home / Self Care | Payer: Self-pay | Source: Home / Self Care | Attending: Student

## 2023-05-21 ENCOUNTER — Encounter (HOSPITAL_COMMUNITY): Payer: Self-pay | Admitting: Student

## 2023-05-21 ENCOUNTER — Ambulatory Visit (HOSPITAL_BASED_OUTPATIENT_CLINIC_OR_DEPARTMENT_OTHER): Payer: MEDICAID | Admitting: Anesthesiology

## 2023-05-21 ENCOUNTER — Other Ambulatory Visit: Payer: Self-pay

## 2023-05-21 ENCOUNTER — Ambulatory Visit (HOSPITAL_COMMUNITY): Payer: MEDICAID | Admitting: Anesthesiology

## 2023-05-21 ENCOUNTER — Ambulatory Visit (HOSPITAL_COMMUNITY)
Admission: RE | Admit: 2023-05-21 | Discharge: 2023-05-21 | Disposition: A | Discharge status: Home / Self Care | Payer: MEDICAID | Attending: Student | Admitting: Student

## 2023-05-21 DIAGNOSIS — F17210 Nicotine dependence, cigarettes, uncomplicated: Secondary | ICD-10-CM | POA: Insufficient documentation

## 2023-05-21 DIAGNOSIS — Y838 Other surgical procedures as the cause of abnormal reaction of the patient, or of later complication, without mention of misadventure at the time of the procedure: Secondary | ICD-10-CM | POA: Diagnosis not present

## 2023-05-21 DIAGNOSIS — F419 Anxiety disorder, unspecified: Secondary | ICD-10-CM | POA: Diagnosis not present

## 2023-05-21 DIAGNOSIS — F418 Other specified anxiety disorders: Secondary | ICD-10-CM | POA: Diagnosis not present

## 2023-05-21 DIAGNOSIS — Z79891 Long term (current) use of opiate analgesic: Secondary | ICD-10-CM | POA: Diagnosis not present

## 2023-05-21 DIAGNOSIS — F329 Major depressive disorder, single episode, unspecified: Secondary | ICD-10-CM | POA: Diagnosis not present

## 2023-05-21 DIAGNOSIS — D649 Anemia, unspecified: Secondary | ICD-10-CM | POA: Insufficient documentation

## 2023-05-21 DIAGNOSIS — D759 Disease of blood and blood-forming organs, unspecified: Secondary | ICD-10-CM | POA: Diagnosis not present

## 2023-05-21 DIAGNOSIS — Z7901 Long term (current) use of anticoagulants: Secondary | ICD-10-CM | POA: Diagnosis not present

## 2023-05-21 DIAGNOSIS — T8133XA Disruption of traumatic injury wound repair, initial encounter: Secondary | ICD-10-CM | POA: Diagnosis present

## 2023-05-21 DIAGNOSIS — T8131XA Disruption of external operation (surgical) wound, not elsewhere classified, initial encounter: Secondary | ICD-10-CM

## 2023-05-21 DIAGNOSIS — S7002XA Contusion of left hip, initial encounter: Secondary | ICD-10-CM | POA: Insufficient documentation

## 2023-05-21 LAB — COMPREHENSIVE METABOLIC PANEL WITH GFR
ALT: 17 U/L (ref 0–44)
AST: 20 U/L (ref 15–41)
Albumin: 3.8 g/dL (ref 3.5–5.0)
Alkaline Phosphatase: 169 U/L — ABNORMAL HIGH (ref 38–126)
Anion gap: 10 (ref 5–15)
BUN: 18 mg/dL (ref 6–20)
CO2: 27 mmol/L (ref 22–32)
Calcium: 9.5 mg/dL (ref 8.9–10.3)
Chloride: 102 mmol/L (ref 98–111)
Creatinine, Ser: 0.68 mg/dL (ref 0.44–1.00)
GFR, Estimated: >60 mL/min (ref >60)
Glucose, Bld: 99 mg/dL (ref 70–99)
Potassium: 4.7 mmol/L (ref 3.5–5.1)
Sodium: 139 mmol/L (ref 135–145)
Total Bilirubin: 0.5 mg/dL (ref 0.0–1.2)
Total Protein: 6.8 g/dL (ref 6.5–8.1)

## 2023-05-21 LAB — CBC
HCT: 34.5 % — ABNORMAL LOW (ref 36.0–46.0)
Hemoglobin: 10.4 g/dL — ABNORMAL LOW (ref 12.0–15.0)
MCH: 25.1 pg — ABNORMAL LOW (ref 26.0–34.0)
MCHC: 30.1 g/dL (ref 30.0–36.0)
MCV: 83.3 fL (ref 80.0–100.0)
Platelets: 253 10*3/uL (ref 150–400)
RBC: 4.14 MIL/uL (ref 3.87–5.11)
RDW: 15.6 % — ABNORMAL HIGH (ref 11.5–15.5)
WBC: 4.4 10*3/uL (ref 4.0–10.5)
nRBC: 0 % (ref 0.0–0.2)

## 2023-05-21 LAB — POCT PREGNANCY, URINE: Preg Test, Ur: NEGATIVE

## 2023-05-21 SURGERY — IRRIGATION AND DEBRIDEMENT POSTERIOR HIP
Anesthesia: General | Site: Hip | Laterality: Left

## 2023-05-21 MED ORDER — FENTANYL CITRATE (PF) 250 MCG/5ML IJ SOLN
INTRAMUSCULAR | Status: AC
  Filled 2023-05-21: qty 5

## 2023-05-21 MED ORDER — ONDANSETRON HCL 4 MG/2ML IJ SOLN
INTRAMUSCULAR | Status: DC | PRN
  Administered 2023-05-21: 4 mg via INTRAVENOUS

## 2023-05-21 MED ORDER — HYDROMORPHONE HCL 1 MG/ML IJ SOLN
INTRAMUSCULAR | Status: AC
  Filled 2023-05-21: qty 1

## 2023-05-21 MED ORDER — FENTANYL CITRATE (PF) 250 MCG/5ML IJ SOLN
INTRAMUSCULAR | Status: DC | PRN
Start: 2023-05-21 — End: 2023-05-21
  Administered 2023-05-21 (×5): 50 ug via INTRAVENOUS

## 2023-05-21 MED ORDER — HYDROMORPHONE HCL 1 MG/ML IJ SOLN: 0.2500 mg | INTRAMUSCULAR | Status: DC | PRN

## 2023-05-21 MED ORDER — OXYCODONE HCL 5 MG/5ML PO SOLN: 5.0000 mg | Freq: Once | ORAL | Status: AC | PRN

## 2023-05-21 MED ORDER — PHENYLEPHRINE 80 MCG/ML (10ML) SYRINGE FOR IV PUSH (FOR BLOOD PRESSURE SUPPORT)
PREFILLED_SYRINGE | INTRAVENOUS | Status: DC | PRN
  Administered 2023-05-21: 80 ug via INTRAVENOUS

## 2023-05-21 MED ORDER — PROPOFOL 10 MG/ML IV BOLUS
INTRAVENOUS | Status: AC
  Filled 2023-05-21: qty 20

## 2023-05-21 MED ORDER — APREPITANT 40 MG PO CAPS
40.0000 mg | ORAL_CAPSULE | Freq: Once | ORAL | Status: AC
  Administered 2023-05-21: 40 mg via ORAL
  Filled 2023-05-21: qty 1

## 2023-05-21 MED ORDER — PROPOFOL 10 MG/ML IV BOLUS
INTRAVENOUS | Status: DC | PRN
Start: 2023-05-21 — End: 2023-05-21
  Administered 2023-05-21: 50 mg via INTRAVENOUS
  Administered 2023-05-21: 200 mg via INTRAVENOUS

## 2023-05-21 MED ORDER — DOXYCYCLINE HYCLATE 100 MG PO CAPS: 100.0000 mg | ORAL_CAPSULE | Freq: Two times a day (BID) | ORAL | 0 refills | Status: AC

## 2023-05-21 MED ORDER — MIDAZOLAM HCL 2 MG/2ML IJ SOLN
INTRAMUSCULAR | Status: DC | PRN
  Administered 2023-05-21: 2 mg via INTRAVENOUS

## 2023-05-21 MED ORDER — ACETAMINOPHEN 10 MG/ML IV SOLN
INTRAVENOUS | Status: AC
  Filled 2023-05-21: qty 100

## 2023-05-21 MED ORDER — LACTATED RINGERS IV SOLN: INTRAVENOUS | Status: DC

## 2023-05-21 MED ORDER — LIDOCAINE 2% (20 MG/ML) 5 ML SYRINGE
INTRAMUSCULAR | Status: DC | PRN
  Administered 2023-05-21: 100 mg via INTRAVENOUS

## 2023-05-21 MED ORDER — OXYCODONE HCL 5 MG PO TABS
5.0000 mg | ORAL_TABLET | Freq: Once | ORAL | Status: AC | PRN
  Administered 2023-05-21: 5 mg via ORAL

## 2023-05-21 MED ORDER — DEXAMETHASONE SODIUM PHOSPHATE 10 MG/ML IJ SOLN
INTRAMUSCULAR | Status: DC | PRN
Start: 2023-05-21 — End: 2023-05-21
  Administered 2023-05-21: 10 mg via INTRAVENOUS

## 2023-05-21 MED ORDER — SUCCINYLCHOLINE CHLORIDE 200 MG/10ML IV SOSY
PREFILLED_SYRINGE | INTRAVENOUS | Status: DC | PRN
  Administered 2023-05-21: 140 mg via INTRAVENOUS

## 2023-05-21 MED ORDER — OXYCODONE HCL 5 MG PO TABS
ORAL_TABLET | ORAL | Status: AC
  Filled 2023-05-21: qty 1

## 2023-05-21 MED ORDER — LACTATED RINGERS IV SOLN: INTRAVENOUS | Status: DC | PRN

## 2023-05-21 MED ORDER — DROPERIDOL 2.5 MG/ML IJ SOLN: 0.6250 mg | Freq: Once | INTRAMUSCULAR | Status: DC | PRN

## 2023-05-21 MED ORDER — CEFAZOLIN SODIUM-DEXTROSE 2-4 GM/100ML-% IV SOLN
2.0000 g | INTRAVENOUS | Status: DC
  Filled 2023-05-21: qty 100

## 2023-05-21 MED ORDER — DEXMEDETOMIDINE HCL IN NACL 80 MCG/20ML IV SOLN
INTRAVENOUS | Status: DC | PRN
Start: 2023-05-21 — End: 2023-05-21
  Administered 2023-05-21 (×5): 8 ug via INTRAVENOUS

## 2023-05-21 MED ORDER — HYDROMORPHONE HCL 1 MG/ML IJ SOLN
0.2500 mg | INTRAMUSCULAR | Status: DC | PRN
  Administered 2023-05-21 (×5): 0.5 mg via INTRAVENOUS

## 2023-05-21 MED ORDER — MIDAZOLAM HCL 2 MG/2ML IJ SOLN
INTRAMUSCULAR | Status: AC
  Filled 2023-05-21: qty 2

## 2023-05-21 MED ORDER — SCOPOLAMINE 1 MG/3DAYS TD PT72
1.0000 | MEDICATED_PATCH | TRANSDERMAL | Status: DC
  Administered 2023-05-21: 1.5 mg via TRANSDERMAL
  Filled 2023-05-21: qty 1

## 2023-05-21 MED ORDER — TOBRAMYCIN SULFATE 1.2 G IJ SOLR
INTRAMUSCULAR | Status: AC
Start: 2023-05-21 — End: ?
  Filled 2023-05-21: qty 1.2

## 2023-05-21 MED ORDER — KETOROLAC TROMETHAMINE 30 MG/ML IJ SOLN
INTRAMUSCULAR | Status: DC | PRN
Start: 2023-05-21 — End: 2023-05-21
  Administered 2023-05-21: 30 mg via INTRAVENOUS

## 2023-05-21 MED ORDER — ONDANSETRON HCL 4 MG/2ML IJ SOLN: 4.0000 mg | Freq: Once | INTRAMUSCULAR | Status: DC | PRN

## 2023-05-21 MED ORDER — VANCOMYCIN HCL 1000 MG IV SOLR
INTRAVENOUS | Status: AC
  Filled 2023-05-21: qty 20

## 2023-05-21 MED ORDER — TOBRAMYCIN SULFATE 1.2 G IJ SOLR
INTRAMUSCULAR | Status: DC | PRN
  Administered 2023-05-21: 1.2 g via TOPICAL

## 2023-05-21 MED ORDER — VANCOMYCIN HCL 1000 MG IV SOLR
INTRAVENOUS | Status: DC | PRN
  Administered 2023-05-21: 1000 mg via TOPICAL

## 2023-05-21 MED ORDER — CHLORHEXIDINE GLUCONATE 0.12 % MT SOLN
15.0000 mL | Freq: Once | OROMUCOSAL | Status: AC
  Administered 2023-05-21: 15 mL via OROMUCOSAL
  Filled 2023-05-21: qty 15

## 2023-05-21 MED ORDER — ACETAMINOPHEN 10 MG/ML IV SOLN
INTRAVENOUS | Status: DC | PRN
  Administered 2023-05-21: 1000 mg via INTRAVENOUS

## 2023-05-21 MED ORDER — ORAL CARE MOUTH RINSE: 15.0000 mL | Freq: Once | OROMUCOSAL | Status: AC

## 2023-05-21 MED ORDER — 0.9 % SODIUM CHLORIDE (POUR BTL) OPTIME
TOPICAL | Status: DC | PRN
  Administered 2023-05-21: 1000 mL

## 2023-05-21 SURGICAL SUPPLY — 42 items
BAG COUNTER SPONGE SURGICOUNT (BAG) ×1 IMPLANT
BNDG COHESIVE 4X5 TAN STRL LF (GAUZE/BANDAGES/DRESSINGS) ×1 IMPLANT
BNDG GAUZE DERMACEA FLUFF 4 (GAUZE/BANDAGES/DRESSINGS) ×2 IMPLANT
BRUSH SCRUB EZ PLAIN DRY (MISCELLANEOUS) ×2 IMPLANT
CHLORAPREP W/TINT 26 (MISCELLANEOUS) ×1 IMPLANT
COVER MAYO STAND STRL (DRAPES) ×1 IMPLANT
COVER SURGICAL LIGHT HANDLE (MISCELLANEOUS) ×2 IMPLANT
DRAPE SURG 17X23 STRL (DRAPES) ×1 IMPLANT
DRAPE SURG ORHT 6 SPLT 77X108 (DRAPES) ×1 IMPLANT
DRAPE U-SHAPE 47X51 STRL (DRAPES) ×1 IMPLANT
DRSG ADAPTIC 3X8 NADH LF (GAUZE/BANDAGES/DRESSINGS) ×1 IMPLANT
DRSG MEPILEX POST OP 4X12 (GAUZE/BANDAGES/DRESSINGS) IMPLANT
ELECTRODE REM PT RTRN 9FT ADLT (ELECTROSURGICAL) IMPLANT
EVACUATOR 1/8 PVC DRAIN (DRAIN) IMPLANT
GAUZE SPONGE 4X4 12PLY STRL (GAUZE/BANDAGES/DRESSINGS) ×1 IMPLANT
GLOVE BIO SURGEON STRL SZ 6.5 (GLOVE) ×3 IMPLANT
GLOVE BIO SURGEON STRL SZ7.5 (GLOVE) ×4 IMPLANT
GLOVE BIOGEL PI IND STRL 6.5 (GLOVE) ×1 IMPLANT
GLOVE BIOGEL PI IND STRL 7.5 (GLOVE) ×1 IMPLANT
GOWN STRL REUS W/ TWL LRG LVL3 (GOWN DISPOSABLE) ×2 IMPLANT
KIT BASIN OR (CUSTOM PROCEDURE TRAY) ×1 IMPLANT
KIT TURNOVER KIT B (KITS) ×1 IMPLANT
MANIFOLD NEPTUNE II (INSTRUMENTS) ×1 IMPLANT
NS IRRIG 1000ML POUR BTL (IV SOLUTION) ×1 IMPLANT
PACK ORTHO EXTREMITY (CUSTOM PROCEDURE TRAY) ×1 IMPLANT
PAD ARMBOARD POSITIONER FOAM (MISCELLANEOUS) ×2 IMPLANT
PADDING CAST COTTON 6X4 STRL (CAST SUPPLIES) ×1 IMPLANT
SET HNDPC FAN SPRY TIP SCT (DISPOSABLE) IMPLANT
SPONGE T-LAP 18X18 ~~LOC~~+RFID (SPONGE) ×1 IMPLANT
SUT ETHILON 2 0 FS 18 (SUTURE) ×2 IMPLANT
SUT ETHILON 3 0 PS 1 (SUTURE) ×2 IMPLANT
SUT MNCRL AB 0 CT1 27 (SUTURE) IMPLANT
SUT MON AB 2-0 CT1 36 (SUTURE) ×1 IMPLANT
SUT MON AB-0 CT1 36 (SUTURE) IMPLANT
SUT PDS AB 0 CT 36 (SUTURE) IMPLANT
SWAB CULTURE ESWAB REG 1ML (MISCELLANEOUS) IMPLANT
TOWEL GREEN STERILE (TOWEL DISPOSABLE) ×2 IMPLANT
TOWEL GREEN STERILE FF (TOWEL DISPOSABLE) ×1 IMPLANT
TUBE CONNECTING 12X1/4 (SUCTIONS) ×1 IMPLANT
UNDERPAD 30X36 HEAVY ABSORB (UNDERPADS AND DIAPERS) ×1 IMPLANT
WATER STERILE IRR 1000ML POUR (IV SOLUTION) ×1 IMPLANT
YANKAUER SUCT BULB TIP NO VENT (SUCTIONS) ×1 IMPLANT

## 2023-05-21 NOTE — Discharge Instructions (Signed)
 [facility] Discharge Instructions   General Discharge Instructions  WEIGHT BEARING STATUS:Non-weightbearing left leg  RANGE OF MOTION/ACTIVITY: ok for hip and ankle motion  Wound Care: You may remove your surgical dressing on post op day 3 (Sunday 05/24/23). Incisions can be left open to air if there is no drainage. Once the incision is completely dry and without drainage, it may be left open to air out.  Showering may begin post op day 4 (Monday 05/25/23).  Clean incision gently with soap and water .  DVT/PE prophylaxis:  Eliquis   Diet: as you were eating previously.  Can use over the counter stool softeners and bowel preparations, such as Miralax , to help with bowel movements.  Narcotics can be constipating.  Be sure to drink plenty of fluids  PAIN MEDICATION USE AND EXPECTATIONS  You have likely been given narcotic medications to help control your pain.  After a traumatic event that results in an fracture (broken bone) with or without surgery, it is ok to use narcotic pain medications to help control one's pain.  We understand that everyone responds to pain differently and each individual patient will be evaluated on a regular basis for the continued need for narcotic medications. Ideally, narcotic medication use should last no more than 6-8 weeks (coinciding with fracture healing).   As a patient it is your responsibility as well to monitor narcotic medication use and report the amount and frequency you use these medications when you come to your office visit.   We would also advise that if you are using narcotic medications, you should take a dose prior to therapy to maximize you participation.  IF YOU ARE ON NARCOTIC MEDICATIONS IT IS NOT PERMISSIBLE TO OPERATE A MOTOR VEHICLE (MOTORCYCLE/CAR/TRUCK/MOPED) OR HEAVY MACHINERY DO NOT MIX NARCOTICS WITH OTHER CNS (CENTRAL NERVOUS SYSTEM) DEPRESSANTS SUCH AS ALCOHOL   POST-OPERATIVE OPIOID TAPER INSTRUCTIONS: It is important  to wean off of your opioid medication as soon as possible. If you do not need pain medication after your surgery it is ok to stop day one. Opioids include: Codeine, Hydrocodone (Norco, Vicodin), Oxycodone (Percocet, oxycontin ) and hydromorphone  amongst others.  Long term and even short term use of opiods can cause: Increased pain response Dependence Constipation Depression Respiratory depression And more.  Withdrawal symptoms can include Flu like symptoms Nausea, vomiting And more Techniques to manage these symptoms Hydrate well Eat regular healthy meals Stay active Use relaxation techniques(deep breathing, meditating, yoga) Do Not substitute Alcohol  to help with tapering If you have been on opioids for less than two weeks and do not have pain than it is ok to stop all together.  Plan to wean off of opioids This plan should start within one week post op of your fracture surgery  Maintain the same interval or time between taking each dose and first decrease the dose.  Cut the total daily intake of opioids by one tablet each day Next start to increase the time between doses. The last dose that should be eliminated is the evening dose.    STOP SMOKING OR USING NICOTINE  PRODUCTS!!!!  As discussed nicotine  severely impairs your body's ability to heal surgical and traumatic wounds but also impairs bone healing.  Wounds and bone heal by forming microscopic blood vessels (angiogenesis) and nicotine  is a vasoconstrictor (essentially, shrinks blood vessels).  Therefore, if vasoconstriction occurs to these microscopic blood vessels they essentially disappear and are unable to deliver necessary nutrients to the healing tissue.  This is one modifiable factor that you can do  to dramatically increase your chances of healing your injury.  (This means no smoking, no nicotine  gum, patches, etc)  DO NOT USE NONSTEROIDAL ANTI-INFLAMMATORY DRUGS (NSAID'S)  Using products such as Advil  (ibuprofen ), Aleve   (naproxen ), Motrin  (ibuprofen ) for additional pain control during fracture healing can delay and/or prevent the healing response.  If you would like to take over the counter (OTC) medication, Tylenol  (acetaminophen ) is ok.  However, some narcotic medications that are given for pain control contain acetaminophen  as well. Therefore, you should not exceed more than 4000 mg of tylenol  in a day if you do not have liver disease.  Also note that there are may OTC medicines, such as cold medicines and allergy medicines that my contain tylenol  as well.  If you have any questions about medications and/or interactions please ask your doctor/PA or your pharmacist.      ICE AND ELEVATE INJURED/OPERATIVE EXTREMITY  Using ice and elevating the injured extremity above your heart can help with swelling and pain control.  Icing in a pulsatile fashion, such as 20 minutes on and 20 minutes off, can be followed.    Do not place ice directly on skin. Make sure there is a barrier between to skin and the ice pack.    Using frozen items such as frozen peas works well as the conform nicely to the are that needs to be iced.  USE AN ACE WRAP OR TED HOSE FOR SWELLING CONTROL  In addition to icing and elevation, Ace wraps or TED hose are used to help limit and resolve swelling.  It is recommended to use Ace wraps or TED hose until you are informed to stop.    When using Ace Wraps start the wrapping distally (farthest away from the body) and wrap proximally (closer to the body)   Example: If you had surgery on your leg or thing and you do not have a splint on, start the ace wrap at the toes and work your way up to the thigh        If you had surgery on your upper extremity and do not have a splint on, start the ace wrap at your fingers and work your way up to the upper arm  IF YOU ARE IN A CAM BOOT (BLACK BOOT)  You may remove boot periodically. Perform daily dressing changes as noted below.  Wash the liner of the boot regularly  and wear a sock when wearing the boot. It is recommended that you sleep in the boot until told otherwise   CALL THE OFFICE FOR MEDICATION REFILLS OR WITH ANY QUESTIONS/CONCERNS: [phone]   VISIT OUR WEBSITE FOR ADDITIONAL INFORMATION: [email or web address]  Discharge Wound Care Instructions  Do NOT apply any ointments, solutions or lotions to pin sites or surgical wounds.  These prevent needed drainage and even though solutions like hydrogen peroxide kill bacteria, they also damage cells lining the pin sites that help fight infection.  Applying lotions or ointments can keep the wounds moist and can cause them to breakdown and open up as well. This can increase the risk for infection. When in doubt call the office.  Surgical incisions should be dressed daily.  If any drainage is noted, use one layer of adaptic or Mepitel, then gauze, and ABD pad with tape - These dressing supplies should be available at local medical supply stores [facility], [facility], etc) as well as Insurance claims handler ([employer], [facility], [employer], etc)  Once the incision is completely dry and without drainage, it may  be left open to air out.  Showering may begin 36-48 hours later.  Cleaning gently with soap and water .     Call office for the following: Temperature greater than 101F Persistent nausea and vomiting Severe uncontrolled pain Redness, tenderness, or signs of infection (pain, swelling, redness, odor or green/yellow discharge around the site) Difficulty breathing, headache or visual disturbances Hives Persistent dizziness or light-headedness Extreme fatigue Any other questions or concerns you may have after discharge  In an emergency, call 911 or go to an Emergency Department at a nearby hospital  OTHER HELPFUL INFORMATION  If you had a block, it will wear off between 8-24 hrs postop typically.  This is period when your pain may go from nearly zero to the pain you would have had postop without the  block.  This is an abrupt transition but nothing dangerous is happening.  You may take an extra dose of narcotic when this happens.  You should wean off your narcotic medicines as soon as you are able.  Most patients will be off or using minimal narcotics before their first postop appointment.   We suggest you use the pain medication the first night prior to going to bed, in order to ease any pain when the anesthesia wears off. You should avoid taking pain medications on an empty stomach as it will make you nauseous.  Do not drink alcoholic beverages or take illicit drugs when taking pain medications.  In most states it is against the law to drive while you are in a splint or sling.  And certainly against the law to drive while taking narcotics.  You may return to work/school in the next couple of days when you feel up to it.   Pain medication may make you constipated.  Below are a few solutions to try in this order: Decrease the amount of pain medication if you aren't having pain. Drink lots of decaffeinated fluids. Drink prune juice and/or each dried prunes  If the first 3 don't work start with additional solutions Take Colace - an over-the-counter stool softener Take Senokot - an over-the-counter laxative Take Miralax  - a stronger over-the-counter laxative

## 2023-05-21 NOTE — Anesthesia Procedure Notes (Signed)
 Procedure Name: MAC Date/Time: 05/21/2023 7:49 AM  Performed by: Rochelle Chu, CRNAPre-anesthesia Checklist: Patient identified, Emergency Drugs available, Suction available and Patient being monitored Patient Re-evaluated:Patient Re-evaluated prior to induction Oxygen Delivery Method: Circle system utilized Preoxygenation: Pre-oxygenation with 100% oxygen Induction Type: IV induction, Rapid sequence and Cricoid Pressure applied Laryngoscope Size: Mac and 3 Grade View: Grade I Tube type: Oral Tube size: 7.0 mm Number of attempts: 1 Airway Equipment and Method: Stylet and Oral airway Placement Confirmation: ETT inserted through vocal cords under direct vision, positive ETCO2 and breath sounds checked- equal and bilateral Secured at: 21 cm Tube secured with: Tape Dental Injury: Teeth and Oropharynx as per pre-operative assessment

## 2023-05-21 NOTE — Transfer of Care (Signed)
 Immediate Anesthesia Transfer of Care Note  Patient: [name]  Procedure(s) Performed: IRRIGATION AND DEBRIDEMENT LEFT HIP (Left: Hip)  Patient Location: PACU  Anesthesia Type:General  Level of Consciousness: drowsy and patient cooperative  Airway & Oxygen Therapy: Patient Spontanous Breathing  Post-op Assessment: Report given to RN, Post -op Vital signs reviewed and stable, and Patient moving all extremities X 4  Post vital signs: Reviewed and stable  Last Vitals:  Vitals Value Taken Time  BP 112/51 05/21/23 0833  Temp    Pulse 108 05/21/23 0833  Resp 21 05/21/23 0833  SpO2 97 % 05/21/23 0833    Last Pain:  Vitals:   05/21/23 0635  TempSrc:   PainSc: 8       Patients Stated Pain Goal: 5 (05/21/23 9604)  Complications: No notable events documented.

## 2023-05-21 NOTE — Interval H&P Note (Signed)
 History and Physical Interval Note:  05/21/2023 7:22 AM  [name]  has presented today for surgery, with the diagnosis of Left hip infection.  The various methods of treatment have been discussed with the patient and family. After consideration of risks, benefits and other options for treatment, the patient has consented to  Procedure(s): IRRIGATION AND DEBRIDEMENT HIP (Left) as a surgical intervention.  The patient's history has been reviewed, patient examined, no change in status, stable for surgery.  I have reviewed the patient's chart and labs.  Questions were answered to the patient's satisfaction.     Kaniyah Lisby P Kinser Fellman

## 2023-05-21 NOTE — Anesthesia Postprocedure Evaluation (Signed)
 Anesthesia Post Note  Patient: [name]  Procedure(s) Performed: IRRIGATION AND DEBRIDEMENT LEFT HIP (Left: Hip)     Patient location during evaluation: PACU Anesthesia Type: General Level of consciousness: awake and alert and oriented Pain management: pain level controlled Vital Signs Assessment: post-procedure vital signs reviewed and stable Respiratory status: spontaneous breathing, nonlabored ventilation and respiratory function stable Cardiovascular status: blood pressure returned to baseline and stable Postop Assessment: no apparent nausea or vomiting Anesthetic complications: no   No notable events documented.  Last Vitals:  Vitals:   05/21/23 0845 05/21/23 0900  BP: 110/62 111/61  Pulse: (!) 111 (!) 106  Resp: 13 15  Temp:    SpO2: 98% 98%    Last Pain:  Vitals:   05/21/23 0900  TempSrc:   PainSc: 10-Worst pain ever                 Shermaine Rivet A.
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
## 2023-05-24 LAB — AEROBIC/ANAEROBIC CULTURE W GRAM STAIN (SURGICAL/DEEP WOUND)
Gram Stain: NONE SEEN
Gram Stain: NONE SEEN

## 2023-06-11 ENCOUNTER — Emergency Department (HOSPITAL_COMMUNITY): Payer: MEDICAID

## 2023-06-11 ENCOUNTER — Emergency Department (HOSPITAL_COMMUNITY)
Admission: EM | Admit: 2023-06-11 | Discharge: 2023-06-11 | Discharge status: Against Medical Advice | Payer: MEDICAID | Attending: Emergency Medicine | Admitting: Emergency Medicine

## 2023-06-11 DIAGNOSIS — M25552 Pain in left hip: Secondary | ICD-10-CM | POA: Diagnosis present

## 2023-06-11 DIAGNOSIS — Z5321 Procedure and treatment not carried out due to patient leaving prior to being seen by health care provider: Secondary | ICD-10-CM | POA: Insufficient documentation

## 2023-06-11 DIAGNOSIS — T8149XA Infection following a procedure, other surgical site, initial encounter: Secondary | ICD-10-CM | POA: Diagnosis not present

## 2023-06-11 DIAGNOSIS — Y838 Other surgical procedures as the cause of abnormal reaction of the patient, or of later complication, without mention of misadventure at the time of the procedure: Secondary | ICD-10-CM | POA: Diagnosis not present

## 2023-06-11 LAB — CBC WITH DIFFERENTIAL/PLATELET
Abs Immature Granulocytes: 0.04 10*3/uL (ref 0.00–0.07)
Basophils Absolute: 0 10*3/uL (ref 0.0–0.1)
Basophils Relative: 0 %
Eosinophils Absolute: 0.2 10*3/uL (ref 0.0–0.5)
Eosinophils Relative: 4 %
HCT: 36.5 % (ref 36.0–46.0)
Hemoglobin: 11.2 g/dL — ABNORMAL LOW (ref 12.0–15.0)
Immature Granulocytes: 1 %
Lymphocytes Relative: 30 %
Lymphs Abs: 1.5 10*3/uL (ref 0.7–4.0)
MCH: 24.5 pg — ABNORMAL LOW (ref 26.0–34.0)
MCHC: 30.7 g/dL (ref 30.0–36.0)
MCV: 79.9 fL — ABNORMAL LOW (ref 80.0–100.0)
Monocytes Absolute: 0.3 10*3/uL (ref 0.1–1.0)
Monocytes Relative: 5 %
Neutro Abs: 2.9 10*3/uL (ref 1.7–7.7)
Neutrophils Relative %: 60 %
Platelets: 264 10*3/uL (ref 150–400)
RBC: 4.57 MIL/uL (ref 3.87–5.11)
RDW: 15.2 % (ref 11.5–15.5)
WBC: 4.8 10*3/uL (ref 4.0–10.5)
nRBC: 0 % (ref 0.0–0.2)

## 2023-06-11 LAB — COMPREHENSIVE METABOLIC PANEL WITH GFR
ALT: 13 U/L (ref 0–44)
AST: 13 U/L — ABNORMAL LOW (ref 15–41)
Albumin: 3.4 g/dL — ABNORMAL LOW (ref 3.5–5.0)
Alkaline Phosphatase: 154 U/L — ABNORMAL HIGH (ref 38–126)
Anion gap: 11 (ref 5–15)
BUN: 11 mg/dL (ref 6–20)
CO2: 28 mmol/L (ref 22–32)
Calcium: 9.7 mg/dL (ref 8.9–10.3)
Chloride: 102 mmol/L (ref 98–111)
Creatinine, Ser: 0.62 mg/dL (ref 0.44–1.00)
GFR, Estimated: >60 mL/min (ref >60)
Glucose, Bld: 102 mg/dL — ABNORMAL HIGH (ref 70–99)
Potassium: 3.8 mmol/L (ref 3.5–5.1)
Sodium: 141 mmol/L (ref 135–145)
Total Bilirubin: 0.5 mg/dL (ref 0.0–1.2)
Total Protein: 6.6 g/dL (ref 6.5–8.1)

## 2023-06-11 LAB — I-STAT CG4 LACTIC ACID, ED: Lactic Acid, Venous: 0.8 mmol/L (ref 0.5–1.9)

## 2023-06-11 LAB — HCG, SERUM, QUALITATIVE: Preg, Serum: NEGATIVE

## 2023-06-11 NOTE — ED Notes (Signed)
 Patient states she been waiting a long time and doesn't want to wait anymore so she left
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
## 2023-06-11 NOTE — ED Provider Triage Note (Signed)
 Emergency Medicine Provider Triage Evaluation Note  [name] , a 28 y.o. female  was evaluated in triage.  Pt complains of wound infection.  History of hip surgery on the left after injury ended up with infection and needed debridement and washout now taking Levaquin after completing a course of doxycycline  continues to have severe pain drainage and fevers at home.  Review of Systems  Positive: Left hip pain drainage Negative: Vomiting  Physical Exam  BP (!) 102/58 (BP Location: Left Arm)   Pulse (!) 108   Temp 97.7 F (36.5 C)   Resp 17   LMP 04/20/2023 (Approximate)   SpO2 100%  Gen:   Awake, no distress  \   Resp:  Normal effort  MSK:   Moves extremities without difficulty  Other:    Medical Decision Making  Medically screening exam initiated at 4:13 PM.  Appropriate orders placed.  CHATTIE RENTER was informed that the remainder of the evaluation will be completed by another provider, this initial triage assessment does not replace that evaluation, and the importance of remaining in the ED until their evaluation is complete.     Tama Fails, PA-C 06/11/23 1614

## 2023-06-16 ENCOUNTER — Other Ambulatory Visit: Payer: Self-pay

## 2023-06-16 ENCOUNTER — Emergency Department (HOSPITAL_COMMUNITY): Payer: MEDICAID

## 2023-06-16 ENCOUNTER — Emergency Department (HOSPITAL_COMMUNITY)
Admission: EM | Admit: 2023-06-16 | Discharge: 2023-06-17 | Disposition: A | Discharge status: Home / Self Care | Payer: MEDICAID | Attending: Emergency Medicine | Admitting: Emergency Medicine

## 2023-06-16 DIAGNOSIS — Z91040 Latex allergy status: Secondary | ICD-10-CM | POA: Diagnosis not present

## 2023-06-16 DIAGNOSIS — N3001 Acute cystitis with hematuria: Secondary | ICD-10-CM

## 2023-06-16 DIAGNOSIS — M7989 Other specified soft tissue disorders: Secondary | ICD-10-CM | POA: Diagnosis not present

## 2023-06-16 DIAGNOSIS — F17200 Nicotine dependence, unspecified, uncomplicated: Secondary | ICD-10-CM | POA: Diagnosis not present

## 2023-06-16 DIAGNOSIS — Z7901 Long term (current) use of anticoagulants: Secondary | ICD-10-CM | POA: Diagnosis not present

## 2023-06-16 DIAGNOSIS — N939 Abnormal uterine and vaginal bleeding, unspecified: Secondary | ICD-10-CM | POA: Diagnosis not present

## 2023-06-16 DIAGNOSIS — T8149XA Infection following a procedure, other surgical site, initial encounter: Secondary | ICD-10-CM | POA: Diagnosis present

## 2023-06-16 LAB — URINALYSIS, ROUTINE W REFLEX MICROSCOPIC
Bilirubin Urine: NEGATIVE
Glucose, UA: NEGATIVE mg/dL
Ketones, ur: NEGATIVE mg/dL
Leukocytes,Ua: NEGATIVE
Nitrite: NEGATIVE
Protein, ur: 100 mg/dL — AB
RBC / HPF: >50 RBC/hpf (ref 0–5)
Specific Gravity, Urine: 1.03 (ref 1.005–1.030)
pH: 5 (ref 5.0–8.0)

## 2023-06-16 LAB — CBC WITH DIFFERENTIAL/PLATELET
Abs Immature Granulocytes: 0.02 10*3/uL (ref 0.00–0.07)
Basophils Absolute: 0 10*3/uL (ref 0.0–0.1)
Basophils Relative: 1 %
Eosinophils Absolute: 0.2 10*3/uL (ref 0.0–0.5)
Eosinophils Relative: 5 %
HCT: 34 % — ABNORMAL LOW (ref 36.0–46.0)
Hemoglobin: 10.1 g/dL — ABNORMAL LOW (ref 12.0–15.0)
Immature Granulocytes: 1 %
Lymphocytes Relative: 39 %
Lymphs Abs: 1.5 10*3/uL (ref 0.7–4.0)
MCH: 23.7 pg — ABNORMAL LOW (ref 26.0–34.0)
MCHC: 29.7 g/dL — ABNORMAL LOW (ref 30.0–36.0)
MCV: 79.8 fL — ABNORMAL LOW (ref 80.0–100.0)
Monocytes Absolute: 0.3 10*3/uL (ref 0.1–1.0)
Monocytes Relative: 9 %
Neutro Abs: 1.7 10*3/uL (ref 1.7–7.7)
Neutrophils Relative %: 45 %
Platelets: 207 10*3/uL (ref 150–400)
RBC: 4.26 MIL/uL (ref 3.87–5.11)
RDW: 15 % (ref 11.5–15.5)
WBC: 3.7 10*3/uL — ABNORMAL LOW (ref 4.0–10.5)
nRBC: 0 % (ref 0.0–0.2)

## 2023-06-16 LAB — COMPREHENSIVE METABOLIC PANEL WITH GFR
ALT: 27 U/L (ref 0–44)
AST: 23 U/L (ref 15–41)
Albumin: 3.1 g/dL — ABNORMAL LOW (ref 3.5–5.0)
Alkaline Phosphatase: 143 U/L — ABNORMAL HIGH (ref 38–126)
Anion gap: 9 (ref 5–15)
BUN: 11 mg/dL (ref 6–20)
CO2: 27 mmol/L (ref 22–32)
Calcium: 9 mg/dL (ref 8.9–10.3)
Chloride: 103 mmol/L (ref 98–111)
Creatinine, Ser: 0.56 mg/dL (ref 0.44–1.00)
GFR, Estimated: >60 mL/min (ref >60)
Glucose, Bld: 88 mg/dL (ref 70–99)
Potassium: 4.1 mmol/L (ref 3.5–5.1)
Sodium: 139 mmol/L (ref 135–145)
Total Bilirubin: 0.5 mg/dL (ref 0.0–1.2)
Total Protein: 6 g/dL — ABNORMAL LOW (ref 6.5–8.1)

## 2023-06-16 LAB — HCG, SERUM, QUALITATIVE: Preg, Serum: NEGATIVE

## 2023-06-16 LAB — CULTURE, BLOOD (ROUTINE X 2): Culture: NO GROWTH

## 2023-06-16 MED ORDER — SODIUM CHLORIDE 0.9 % IV SOLN
1.0000 g | Freq: Once | INTRAVENOUS | Status: AC
  Administered 2023-06-16: 1 g via INTRAVENOUS
  Filled 2023-06-16: qty 10

## 2023-06-16 NOTE — ED Notes (Signed)
 Pt back from CT

## 2023-06-16 NOTE — ED Notes (Signed)
 Pt taken to CT.
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
## 2023-06-17 LAB — TYPE AND SCREEN
ABO/RH(D): A POS
Antibody Screen: POSITIVE
Donor AG Type: NEGATIVE
Donor AG Type: NEGATIVE
Unit division: 0
Unit division: 0

## 2023-06-17 LAB — BPAM RBC
Blood Product Expiration Date: 202506212359
Blood Product Expiration Date: 202506232359
Unit Type and Rh: 6200
Unit Type and Rh: 6200

## 2023-06-17 MED ORDER — CEPHALEXIN 500 MG PO CAPS: 500.0000 mg | ORAL_CAPSULE | Freq: Four times a day (QID) | ORAL | 0 refills | Status: DC

## 2023-06-17 NOTE — Discharge Instructions (Signed)
 Take the antibiotic Keflex  as directed.  Dr. Curtiss Dowdy is planning on seeing you tomorrow in the office.  He should call you in the morning.  Nothing to eat or drink after midnight he may need to wash this out again.  If you do not get a call from him call the office in the morning.  Also the antibiotic is helpful for your probable urinary tract infection.  As we discussed the CT scan of the abdomen and pelvis otherwise had nothing acute.  But there is evidence of infection around the left hip surgical site again.

## 2023-06-17 NOTE — ED Notes (Signed)
 Pt wheeled to the circle to her ride. All belongings with pt.

## 2023-06-17 NOTE — ED Notes (Signed)
 Pt assisted with changing pads and getting ready for DC.

## 2023-06-18 LAB — URINE CULTURE: Culture: <10000 — AB

## 2023-06-21 LAB — CULTURE, BLOOD (ROUTINE X 2)
Culture: NO GROWTH
Culture: NO GROWTH

## 2023-06-23 ENCOUNTER — Ambulatory Visit: Payer: Self-pay | Admitting: Student

## 2023-06-23 ENCOUNTER — Other Ambulatory Visit: Payer: Self-pay

## 2023-06-23 ENCOUNTER — Encounter (HOSPITAL_COMMUNITY): Payer: Self-pay | Admitting: Student
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
## 2023-06-23 NOTE — Progress Notes (Signed)
 SDW call  Patient was given pre-op instructions over the phone. Patient verbalized understanding of instructions provided.     PCP - Denies Cardiologist -  Pulmonary:    PPM/ICD - denies Device Orders - na Rep Notified - na   Chest x-ray - 06/16/2023 EKG -  11/19/2022 Stress Test - ECHO -  Cardiac Cath -   Sleep Study/sleep apnea/CPAP: denies  Non-diabetic  Blood Thinner Instructions: States her Eliquis  has been on hold Aspirin Instructions:denies   ERAS Protcol - Clears until 1610   Anesthesia review: Yes.  Difficult airway.  Was SDW add on 05/20/2023 with Dr. Yvonnie Heritage reviewing morning of surgery   Patient denies shortness of breath, fever, cough and chest pain over the phone call  Your procedure is scheduled on Wednesday Jun 24, 2023  Report to [facility] Main Entrance "A" at  0715  A.M., then check in with the Admitting office.  Call this number if you have problems the morning of surgery:  [phone]   If you have any questions prior to your surgery date call [phone]: Open Monday-Friday 8am-4pm If you experience any cold or flu symptoms such as cough, fever, chills, shortness of breath, etc. between now and your scheduled surgery, please notify us  at the above number     Remember:  Do not eat after midnight the night before your surgery  You may drink clear liquids until  0645   the morning of your surgery.   Clear liquids allowed are: Water , Non-Citrus Juices (without pulp), Carbonated Beverages, Clear Tea, Black Coffee ONLY (NO MILK, CREAM OR POWDERED CREAMER of any kind), and Gatorade   Take these medicines the morning of surgery with A SIP OF WATER :  Subutox per prescribers instructions, keflex , flexeril , hydroxazine  As needed: Zofran , oxycodone   As of today, STOP taking any Aspirin (unless otherwise instructed by your surgeon) Aleve , Naproxen , Ibuprofen , Motrin , Advil , Goody's, BC's, all herbal medications, fish oil, and all vitamins.
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
## 2023-06-24 ENCOUNTER — Inpatient Hospital Stay (HOSPITAL_COMMUNITY): Payer: MEDICAID | Admitting: Anesthesiology

## 2023-06-24 ENCOUNTER — Other Ambulatory Visit: Payer: Self-pay

## 2023-06-24 ENCOUNTER — Encounter (HOSPITAL_COMMUNITY): Payer: Self-pay | Admitting: Student

## 2023-06-24 ENCOUNTER — Inpatient Hospital Stay (HOSPITAL_COMMUNITY)
Admission: RE | Admit: 2023-06-24 | Discharge: 2023-06-26 | DRG: 857 | Disposition: A | Discharge status: Home / Self Care | Payer: MEDICAID | Attending: Student | Admitting: Student

## 2023-06-24 ENCOUNTER — Encounter (HOSPITAL_COMMUNITY)
Admission: RE | Disposition: A | Discharge status: Home / Self Care | Payer: Self-pay | Source: Home / Self Care | Attending: Student

## 2023-06-24 DIAGNOSIS — Z9102 Food additives allergy status: Secondary | ICD-10-CM | POA: Diagnosis not present

## 2023-06-24 DIAGNOSIS — F17210 Nicotine dependence, cigarettes, uncomplicated: Secondary | ICD-10-CM | POA: Diagnosis present

## 2023-06-24 DIAGNOSIS — F418 Other specified anxiety disorders: Secondary | ICD-10-CM

## 2023-06-24 DIAGNOSIS — Z7901 Long term (current) use of anticoagulants: Secondary | ICD-10-CM | POA: Diagnosis not present

## 2023-06-24 DIAGNOSIS — Z79899 Other long term (current) drug therapy: Secondary | ICD-10-CM | POA: Diagnosis not present

## 2023-06-24 DIAGNOSIS — F411 Generalized anxiety disorder: Secondary | ICD-10-CM | POA: Diagnosis present

## 2023-06-24 DIAGNOSIS — T8141XA Infection following a procedure, superficial incisional surgical site, initial encounter: Principal | ICD-10-CM | POA: Diagnosis present

## 2023-06-24 DIAGNOSIS — Z888 Allergy status to other drugs, medicaments and biological substances status: Secondary | ICD-10-CM

## 2023-06-24 DIAGNOSIS — E669 Obesity, unspecified: Secondary | ICD-10-CM

## 2023-06-24 DIAGNOSIS — Z7982 Long term (current) use of aspirin: Secondary | ICD-10-CM | POA: Diagnosis not present

## 2023-06-24 DIAGNOSIS — Y838 Other surgical procedures as the cause of abnormal reaction of the patient, or of later complication, without mention of misadventure at the time of the procedure: Secondary | ICD-10-CM | POA: Diagnosis present

## 2023-06-24 DIAGNOSIS — Z91041 Radiographic dye allergy status: Secondary | ICD-10-CM | POA: Diagnosis not present

## 2023-06-24 DIAGNOSIS — Z8249 Family history of ischemic heart disease and other diseases of the circulatory system: Secondary | ICD-10-CM | POA: Diagnosis not present

## 2023-06-24 DIAGNOSIS — E785 Hyperlipidemia, unspecified: Secondary | ICD-10-CM | POA: Diagnosis present

## 2023-06-24 DIAGNOSIS — Z825 Family history of asthma and other chronic lower respiratory diseases: Secondary | ICD-10-CM | POA: Diagnosis not present

## 2023-06-24 DIAGNOSIS — Z818 Family history of other mental and behavioral disorders: Secondary | ICD-10-CM

## 2023-06-24 DIAGNOSIS — E66813 Obesity, class 3: Secondary | ICD-10-CM | POA: Diagnosis not present

## 2023-06-24 DIAGNOSIS — T8149XA Infection following a procedure, other surgical site, initial encounter: Secondary | ICD-10-CM | POA: Diagnosis present

## 2023-06-24 DIAGNOSIS — Z6841 Body Mass Index (BMI) 40.0 and over, adult: Secondary | ICD-10-CM

## 2023-06-24 DIAGNOSIS — Z91040 Latex allergy status: Secondary | ICD-10-CM | POA: Diagnosis not present

## 2023-06-24 DIAGNOSIS — T148XXA Other injury of unspecified body region, initial encounter: Secondary | ICD-10-CM | POA: Diagnosis present

## 2023-06-24 DIAGNOSIS — L02214 Cutaneous abscess of groin: Secondary | ICD-10-CM | POA: Diagnosis present

## 2023-06-24 DIAGNOSIS — F329 Major depressive disorder, single episode, unspecified: Secondary | ICD-10-CM | POA: Diagnosis present

## 2023-06-24 DIAGNOSIS — Z833 Family history of diabetes mellitus: Secondary | ICD-10-CM

## 2023-06-24 DIAGNOSIS — L089 Local infection of the skin and subcutaneous tissue, unspecified: Principal | ICD-10-CM | POA: Diagnosis present

## 2023-06-24 HISTORY — PX: INCISION AND DRAINAGE OF WOUND: SHX1803

## 2023-06-24 LAB — POCT PREGNANCY, URINE: Preg Test, Ur: NEGATIVE

## 2023-06-24 SURGERY — IRRIGATION AND DEBRIDEMENT WOUND
Anesthesia: General | Laterality: Left

## 2023-06-24 MED ORDER — VITAMIN D 25 MCG (1000 UNIT) PO TABS
2000.0000 [IU] | ORAL_TABLET | Freq: Every day | ORAL | Status: DC
  Administered 2023-06-24 – 2023-06-26 (×3): 2000 [IU] via ORAL
  Filled 2023-06-24 (×3): qty 2

## 2023-06-24 MED ORDER — OXYCODONE HCL 5 MG PO TABS: 5.0000 mg | ORAL_TABLET | Freq: Once | ORAL | Status: DC | PRN

## 2023-06-24 MED ORDER — ONDANSETRON HCL 4 MG PO TABS: 4.0000 mg | ORAL_TABLET | Freq: Four times a day (QID) | ORAL | Status: DC | PRN

## 2023-06-24 MED ORDER — SODIUM CHLORIDE 0.9 % IV SOLN
2.0000 g | Freq: Three times a day (TID) | INTRAVENOUS | Status: DC
  Administered 2023-06-24 – 2023-06-26 (×7): 2 g via INTRAVENOUS
  Filled 2023-06-24 (×7): qty 12.5

## 2023-06-24 MED ORDER — ACETAMINOPHEN 10 MG/ML IV SOLN
INTRAVENOUS | Status: DC | PRN
  Administered 2023-06-24: 1000 mg via INTRAVENOUS

## 2023-06-24 MED ORDER — SODIUM CHLORIDE 0.9% FLUSH: 3.0000 mL | INTRAVENOUS | Status: DC | PRN

## 2023-06-24 MED ORDER — TOBRAMYCIN SULFATE 1.2 G IJ SOLR
INTRAMUSCULAR | Status: AC
  Filled 2023-06-24: qty 1.2

## 2023-06-24 MED ORDER — SODIUM CHLORIDE 0.9% FLUSH
3.0000 mL | Freq: Two times a day (BID) | INTRAVENOUS | Status: DC
  Administered 2023-06-24: 10 mL via INTRAVENOUS
  Administered 2023-06-24: 3 mL via INTRAVENOUS
  Administered 2023-06-25: 6 mL via INTRAVENOUS
  Administered 2023-06-25: 10 mL via INTRAVENOUS

## 2023-06-24 MED ORDER — SODIUM CHLORIDE 0.9 % IR SOLN
Status: DC | PRN
  Administered 2023-06-24: 3000 mL

## 2023-06-24 MED ORDER — ORAL CARE MOUTH RINSE: 15.0000 mL | Freq: Once | OROMUCOSAL | Status: AC

## 2023-06-24 MED ORDER — LIDOCAINE 2% (20 MG/ML) 5 ML SYRINGE
INTRAMUSCULAR | Status: DC | PRN
  Administered 2023-06-24: 80 mg via INTRAVENOUS

## 2023-06-24 MED ORDER — FENTANYL CITRATE (PF) 250 MCG/5ML IJ SOLN
INTRAMUSCULAR | Status: AC
  Filled 2023-06-24: qty 5

## 2023-06-24 MED ORDER — QUETIAPINE FUMARATE 100 MG PO TABS
200.0000 mg | ORAL_TABLET | Freq: Every day | ORAL | Status: DC
  Administered 2023-06-24 – 2023-06-25 (×2): 200 mg via ORAL
  Filled 2023-06-24 (×2): qty 2

## 2023-06-24 MED ORDER — METOCLOPRAMIDE HCL 5 MG/ML IJ SOLN: 5.0000 mg | Freq: Three times a day (TID) | INTRAMUSCULAR | Status: DC | PRN

## 2023-06-24 MED ORDER — CYCLOBENZAPRINE HCL 10 MG PO TABS
10.0000 mg | ORAL_TABLET | Freq: Three times a day (TID) | ORAL | Status: DC
  Administered 2023-06-24 – 2023-06-26 (×7): 10 mg via ORAL
  Filled 2023-06-24 (×7): qty 1

## 2023-06-24 MED ORDER — ONDANSETRON HCL 4 MG/2ML IJ SOLN: 4.0000 mg | Freq: Once | INTRAMUSCULAR | Status: AC

## 2023-06-24 MED ORDER — CHLORHEXIDINE GLUCONATE 0.12 % MT SOLN
15.0000 mL | Freq: Once | OROMUCOSAL | Status: AC
  Administered 2023-06-24: 15 mL via OROMUCOSAL
  Filled 2023-06-24: qty 15

## 2023-06-24 MED ORDER — VANCOMYCIN HCL 1000 MG IV SOLR
INTRAVENOUS | Status: AC
  Filled 2023-06-24: qty 20

## 2023-06-24 MED ORDER — CEFAZOLIN SODIUM-DEXTROSE 2-4 GM/100ML-% IV SOLN
2.0000 g | INTRAVENOUS | Status: AC
  Administered 2023-06-24: 2 g via INTRAVENOUS
  Filled 2023-06-24: qty 100

## 2023-06-24 MED ORDER — KETAMINE HCL 10 MG/ML IJ SOLN
INTRAMUSCULAR | Status: DC | PRN
  Administered 2023-06-24: 20 mg via INTRAVENOUS
  Administered 2023-06-24: 30 mg via INTRAVENOUS

## 2023-06-24 MED ORDER — ONDANSETRON HCL 4 MG/2ML IJ SOLN
INTRAMUSCULAR | Status: AC
  Administered 2023-06-24: 4 mg via INTRAVENOUS
  Filled 2023-06-24: qty 2

## 2023-06-24 MED ORDER — ONDANSETRON HCL 4 MG/2ML IJ SOLN
4.0000 mg | Freq: Four times a day (QID) | INTRAMUSCULAR | Status: DC | PRN
  Administered 2023-06-26 (×2): 4 mg via INTRAVENOUS
  Filled 2023-06-24: qty 2

## 2023-06-24 MED ORDER — DEXMEDETOMIDINE HCL IN NACL 80 MCG/20ML IV SOLN
INTRAVENOUS | Status: DC | PRN
  Administered 2023-06-24 (×2): 8 ug via INTRAVENOUS

## 2023-06-24 MED ORDER — LACTATED RINGERS IV SOLN: INTRAVENOUS | Status: DC

## 2023-06-24 MED ORDER — KETAMINE HCL 50 MG/5ML IJ SOSY
PREFILLED_SYRINGE | INTRAMUSCULAR | Status: AC
  Filled 2023-06-24: qty 5

## 2023-06-24 MED ORDER — PROPOFOL 10 MG/ML IV BOLUS
INTRAVENOUS | Status: DC | PRN
  Administered 2023-06-24: 200 mg via INTRAVENOUS

## 2023-06-24 MED ORDER — KETOROLAC TROMETHAMINE 15 MG/ML IJ SOLN
15.0000 mg | Freq: Four times a day (QID) | INTRAMUSCULAR | Status: DC
Start: 2023-06-24 — End: 2023-06-29
  Administered 2023-06-24 – 2023-06-26 (×10): 15 mg via INTRAVENOUS
  Filled 2023-06-24 (×10): qty 1

## 2023-06-24 MED ORDER — BUPRENORPHINE HCL 8 MG SL SUBL
8.0000 mg | SUBLINGUAL_TABLET | Freq: Two times a day (BID) | SUBLINGUAL | Status: DC
  Administered 2023-06-24 – 2023-06-26 (×5): 8 mg via SUBLINGUAL
  Filled 2023-06-24 (×5): qty 1

## 2023-06-24 MED ORDER — 0.9 % SODIUM CHLORIDE (POUR BTL) OPTIME
TOPICAL | Status: DC | PRN
  Administered 2023-06-24: 1000 mL

## 2023-06-24 MED ORDER — MIDAZOLAM HCL 2 MG/2ML IJ SOLN
INTRAMUSCULAR | Status: AC
Start: 2023-06-24 — End: ?
  Filled 2023-06-24: qty 2

## 2023-06-24 MED ORDER — DOCUSATE SODIUM 100 MG PO CAPS
100.0000 mg | ORAL_CAPSULE | Freq: Two times a day (BID) | ORAL | Status: DC
  Administered 2023-06-24 – 2023-06-26 (×5): 100 mg via ORAL
  Filled 2023-06-24 (×5): qty 1

## 2023-06-24 MED ORDER — DEXAMETHASONE SODIUM PHOSPHATE 10 MG/ML IJ SOLN
INTRAMUSCULAR | Status: DC | PRN
  Administered 2023-06-24: 10 mg via INTRAVENOUS

## 2023-06-24 MED ORDER — METOCLOPRAMIDE HCL 5 MG PO TABS
5.0000 mg | ORAL_TABLET | Freq: Three times a day (TID) | ORAL | Status: DC | PRN
  Administered 2023-06-24: 10 mg via ORAL
  Filled 2023-06-24: qty 2

## 2023-06-24 MED ORDER — MIDAZOLAM HCL 2 MG/2ML IJ SOLN
INTRAMUSCULAR | Status: DC | PRN
  Administered 2023-06-24: 2 mg via INTRAVENOUS

## 2023-06-24 MED ORDER — ACETAMINOPHEN 10 MG/ML IV SOLN
INTRAVENOUS | Status: AC
  Filled 2023-06-24: qty 100

## 2023-06-24 MED ORDER — DROPERIDOL 2.5 MG/ML IJ SOLN: 0.6250 mg | Freq: Once | INTRAMUSCULAR | Status: DC | PRN

## 2023-06-24 MED ORDER — HYDROXYZINE HCL 25 MG PO TABS
25.0000 mg | ORAL_TABLET | Freq: Three times a day (TID) | ORAL | Status: DC
  Administered 2023-06-24 – 2023-06-26 (×7): 25 mg via ORAL
  Filled 2023-06-24 (×7): qty 1

## 2023-06-24 MED ORDER — DIPHENHYDRAMINE HCL 12.5 MG/5ML PO ELIX
12.5000 mg | ORAL_SOLUTION | ORAL | Status: DC | PRN
  Administered 2023-06-26: 12.5 mg via ORAL
  Filled 2023-06-24: qty 10

## 2023-06-24 MED ORDER — FENTANYL CITRATE (PF) 250 MCG/5ML IJ SOLN
INTRAMUSCULAR | Status: DC | PRN
  Administered 2023-06-24 (×2): 50 ug via INTRAVENOUS

## 2023-06-24 MED ORDER — HYDROMORPHONE HCL 1 MG/ML IJ SOLN: 0.2500 mg | INTRAMUSCULAR | Status: DC | PRN

## 2023-06-24 MED ORDER — OXYCODONE HCL 5 MG/5ML PO SOLN: 5.0000 mg | Freq: Once | ORAL | Status: DC | PRN

## 2023-06-24 MED ORDER — ACETAMINOPHEN 500 MG PO TABS
1000.0000 mg | ORAL_TABLET | Freq: Four times a day (QID) | ORAL | Status: DC
  Administered 2023-06-24 – 2023-06-26 (×8): 1000 mg via ORAL
  Filled 2023-06-24 (×11): qty 2

## 2023-06-24 MED ORDER — ROCURONIUM BROMIDE 10 MG/ML (PF) SYRINGE
PREFILLED_SYRINGE | INTRAVENOUS | Status: DC | PRN
  Administered 2023-06-24: 70 mg via INTRAVENOUS

## 2023-06-24 MED ORDER — POLYETHYLENE GLYCOL 3350 17 G PO PACK: 17.0000 g | PACK | Freq: Every day | ORAL | Status: DC | PRN

## 2023-06-24 MED ORDER — SODIUM CHLORIDE 0.9 % IV SOLN: 12.5000 mg | INTRAVENOUS | Status: DC | PRN

## 2023-06-24 MED ORDER — ASPIRIN 325 MG PO TABS
325.0000 mg | ORAL_TABLET | Freq: Every day | ORAL | Status: DC
  Administered 2023-06-25 – 2023-06-26 (×2): 325 mg via ORAL
  Filled 2023-06-24 (×2): qty 1

## 2023-06-24 MED ORDER — ONDANSETRON HCL 4 MG/2ML IJ SOLN
INTRAMUSCULAR | Status: AC
  Filled 2023-06-24: qty 2

## 2023-06-24 MED ORDER — TOBRAMYCIN SULFATE 1.2 G IJ SOLR
INTRAMUSCULAR | Status: DC | PRN
  Administered 2023-06-24: 1.2 g

## 2023-06-24 MED ORDER — SOD CITRATE-CITRIC ACID 500-334 MG/5ML PO SOLN
ORAL | Status: AC
  Administered 2023-06-24: 30 mL via ORAL
  Filled 2023-06-24: qty 30

## 2023-06-24 MED ORDER — KETOROLAC TROMETHAMINE 30 MG/ML IJ SOLN
INTRAMUSCULAR | Status: AC
  Filled 2023-06-24: qty 1

## 2023-06-24 MED ORDER — VANCOMYCIN HCL 1000 MG IV SOLR
INTRAVENOUS | Status: DC | PRN
  Administered 2023-06-24: 1000 mg

## 2023-06-24 MED ORDER — SUGAMMADEX SODIUM 200 MG/2ML IV SOLN
INTRAVENOUS | Status: DC | PRN
  Administered 2023-06-24: 200 mg via INTRAVENOUS

## 2023-06-24 MED ORDER — LIDOCAINE IN D5W 4-5 MG/ML-% IV SOLN
INTRAVENOUS | Status: DC | PRN
Start: 2023-06-24 — End: 2023-06-24
  Administered 2023-06-24: 1.5 mg/min via INTRAVENOUS

## 2023-06-24 MED ORDER — PROPOFOL 10 MG/ML IV BOLUS
INTRAVENOUS | Status: AC
  Filled 2023-06-24: qty 20

## 2023-06-24 MED ORDER — SOD CITRATE-CITRIC ACID 500-334 MG/5ML PO SOLN: 30.0000 mL | Freq: Once | ORAL | Status: AC

## 2023-06-24 MED ORDER — KETOROLAC TROMETHAMINE 30 MG/ML IJ SOLN
INTRAMUSCULAR | Status: DC | PRN
  Administered 2023-06-24: 30 mg via INTRAVENOUS

## 2023-06-24 MED ORDER — HYDROMORPHONE HCL 1 MG/ML IJ SOLN
0.5000 mg | INTRAMUSCULAR | Status: DC | PRN
  Administered 2023-06-24 – 2023-06-25 (×2): 0.5 mg via INTRAVENOUS
  Filled 2023-06-24 (×2): qty 1

## 2023-06-24 MED ORDER — OXYCODONE HCL 5 MG PO TABS
5.0000 mg | ORAL_TABLET | ORAL | Status: DC | PRN
  Administered 2023-06-24 – 2023-06-26 (×5): 10 mg via ORAL
  Filled 2023-06-24 (×5): qty 2

## 2023-06-24 MED ORDER — NALOXONE HCL 4 MG/0.1ML NA LIQD
1.0000 | Freq: Every day | NASAL | Status: DC | PRN
  Filled 2023-06-24: qty 8

## 2023-06-24 MED ORDER — ONDANSETRON HCL 4 MG/2ML IJ SOLN
INTRAMUSCULAR | Status: DC | PRN
  Administered 2023-06-24: 4 mg via INTRAVENOUS

## 2023-06-24 MED ORDER — GABAPENTIN 300 MG PO CAPS
300.0000 mg | ORAL_CAPSULE | Freq: Three times a day (TID) | ORAL | Status: DC
  Administered 2023-06-24 – 2023-06-26 (×7): 300 mg via ORAL
  Filled 2023-06-24 (×7): qty 1

## 2023-06-24 SURGICAL SUPPLY — 43 items
BAG COUNTER SPONGE SURGICOUNT (BAG) ×1 IMPLANT
BNDG COHESIVE 4X5 TAN STRL LF (GAUZE/BANDAGES/DRESSINGS) ×1 IMPLANT
BNDG GAUZE DERMACEA FLUFF 4 (GAUZE/BANDAGES/DRESSINGS) ×2 IMPLANT
BRUSH SCRUB EZ PLAIN DRY (MISCELLANEOUS) ×2 IMPLANT
CANISTER WOUNDNEG PRESSURE 500 (CANNISTER) IMPLANT
CHLORAPREP W/TINT 26 (MISCELLANEOUS) ×1 IMPLANT
COVER MAYO STAND STRL (DRAPES) ×1 IMPLANT
COVER SURGICAL LIGHT HANDLE (MISCELLANEOUS) ×2 IMPLANT
DRAPE SURG 17X23 STRL (DRAPES) ×1 IMPLANT
DRAPE SURG ORHT 6 SPLT 77X108 (DRAPES) ×1 IMPLANT
DRAPE U-SHAPE 47X51 STRL (DRAPES) ×1 IMPLANT
DRESSING VERAFLO CLEANS CC MED (GAUZE/BANDAGES/DRESSINGS) IMPLANT
DRSG ADAPTIC 3X8 NADH LF (GAUZE/BANDAGES/DRESSINGS) ×1 IMPLANT
ELECTRODE REM PT RTRN 9FT ADLT (ELECTROSURGICAL) IMPLANT
EVACUATOR 1/8 PVC DRAIN (DRAIN) IMPLANT
GAUZE SPONGE 4X4 12PLY STRL (GAUZE/BANDAGES/DRESSINGS) ×1 IMPLANT
GLOVE BIO SURGEON STRL SZ 6.5 (GLOVE) ×3 IMPLANT
GLOVE BIO SURGEON STRL SZ7.5 (GLOVE) ×4 IMPLANT
GLOVE BIOGEL PI IND STRL 6.5 (GLOVE) ×1 IMPLANT
GLOVE BIOGEL PI IND STRL 7.5 (GLOVE) ×1 IMPLANT
GOWN STRL REUS W/ TWL LRG LVL3 (GOWN DISPOSABLE) ×2 IMPLANT
KIT BASIN OR (CUSTOM PROCEDURE TRAY) ×1 IMPLANT
KIT TURNOVER KIT B (KITS) ×1 IMPLANT
MANIFOLD NEPTUNE II (INSTRUMENTS) ×1 IMPLANT
NS IRRIG 1000ML POUR BTL (IV SOLUTION) ×1 IMPLANT
PACK ORTHO EXTREMITY (CUSTOM PROCEDURE TRAY) ×1 IMPLANT
PAD ARMBOARD POSITIONER FOAM (MISCELLANEOUS) ×2 IMPLANT
PAD NEG PRESSURE SENSATRAC (MISCELLANEOUS) IMPLANT
PADDING CAST COTTON 6X4 STRL (CAST SUPPLIES) ×1 IMPLANT
POWDER MYRIAD MORCLLS FINE 500 (Miscellaneous) IMPLANT
SET HNDPC FAN SPRY TIP SCT (DISPOSABLE) IMPLANT
SPONGE T-LAP 18X18 ~~LOC~~+RFID (SPONGE) ×1 IMPLANT
SUT ETHILON 2 0 FS 18 (SUTURE) ×2 IMPLANT
SUT ETHILON 3 0 PS 1 (SUTURE) ×2 IMPLANT
SUT MON AB 2-0 CT1 36 (SUTURE) ×1 IMPLANT
SUT PDS AB 0 CT 36 (SUTURE) IMPLANT
SWAB CULTURE ESWAB REG 1ML (MISCELLANEOUS) IMPLANT
TOWEL GREEN STERILE (TOWEL DISPOSABLE) ×2 IMPLANT
TOWEL GREEN STERILE FF (TOWEL DISPOSABLE) ×1 IMPLANT
TUBE CONNECTING 12X1/4 (SUCTIONS) ×1 IMPLANT
UNDERPAD 30X36 HEAVY ABSORB (UNDERPADS AND DIAPERS) ×1 IMPLANT
WATER STERILE IRR 1000ML POUR (IV SOLUTION) ×1 IMPLANT
YANKAUER SUCT BULB TIP NO VENT (SUCTIONS) ×1 IMPLANT

## 2023-06-24 NOTE — Anesthesia Preprocedure Evaluation (Addendum)
 Anesthesia Evaluation  Patient identified by MRN, date of birth, ID band Patient awake    Reviewed: Allergy & Precautions, NPO status , Patient's Chart, lab work & pertinent test results, reviewed documented beta blocker date and time   History of Anesthesia Complications (+) Emergence Delirium and history of anesthetic complications  Airway Mallampati: II  TM Distance: >3 FB     Dental  (+) Poor Dentition, Dental Advisory Given, Missing, Chipped   Pulmonary Current Smoker and Patient abstained from smoking.   Pulmonary exam normal breath sounds clear to auscultation       Cardiovascular negative cardio ROS Normal cardiovascular exam Rhythm:Regular Rate:Normal     Neuro/Psych  Headaches PSYCHIATRIC DISORDERS Anxiety Depression    ADHD   GI/Hepatic ,GERD  Medicated,,(+)     substance abuse  On subutex - last dose yesterday   Endo/Other  negative endocrine ROS    Renal/GU   negative genitourinary   Musculoskeletal  (+)  narcotic dependentInfection left hip- open wound   Abdominal  (+) + obese  Peds  Hematology  (+) Blood dyscrasia, anemia Eliquis  therapy- last dose yesterday am   Anesthesia Other Findings   Reproductive/Obstetrics                             Anesthesia Physical Anesthesia Plan  ASA: 3  Anesthesia Plan: General   Post-op Pain Management: Dilaudid  IV, Precedex , Tylenol  PO (pre-op)*, Ketamine  IV* and Lidocaine  infusion*   Induction: Intravenous  PONV Risk Score and Plan: 3 and Treatment may vary due to age or medical condition, Midazolam , Dexamethasone  and Ondansetron   Airway Management Planned: Oral ETT  Additional Equipment: None  Intra-op Plan:   Post-operative Plan: Extubation in OR  Informed Consent: I have reviewed the patients History and Physical, chart, labs and discussed the procedure including the risks, benefits and alternatives for the proposed  anesthesia with the patient or authorized representative who has indicated his/her understanding and acceptance.     Dental advisory given  Plan Discussed with: Anesthesiologist and CRNA  Anesthesia Plan Comments:        Anesthesia Quick Evaluation
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
## 2023-06-24 NOTE — Anesthesia Procedure Notes (Signed)
 Procedure Name: Intubation Date/Time: 06/24/2023 10:09 AM  Performed by: Alphia Jasmine, CRNAPre-anesthesia Checklist: Patient identified, Emergency Drugs available, Suction available, Timeout performed and Patient being monitored Patient Re-evaluated:Patient Re-evaluated prior to induction Oxygen Delivery Method: Circle system utilized Preoxygenation: Pre-oxygenation with 100% oxygen Induction Type: IV induction Ventilation: Mask ventilation without difficulty Laryngoscope Size: Mac and 3 Grade View: Grade I Tube type: Oral Tube size: 7.0 mm Number of attempts: 1 Airway Equipment and Method: Stylet Placement Confirmation: ETT inserted through vocal cords under direct vision, positive ETCO2, CO2 detector and breath sounds checked- equal and bilateral Secured at: 22 cm Tube secured with: Tape Dental Injury: Teeth and Oropharynx as per pre-operative assessment

## 2023-06-24 NOTE — Anesthesia Postprocedure Evaluation (Signed)
 Anesthesia Post Note  Patient: [name]  Procedure(s) Performed: IRRIGATION AND DEBRIDEMENT WOUND AND WOUND VAC PLACEMENT HIP (Left)     Patient location during evaluation: PACU Anesthesia Type: General Level of consciousness: awake and alert and oriented Pain management: pain level controlled Vital Signs Assessment: post-procedure vital signs reviewed and stable Respiratory status: spontaneous breathing, nonlabored ventilation and respiratory function stable Cardiovascular status: blood pressure returned to baseline and stable Postop Assessment: no apparent nausea or vomiting Anesthetic complications: no   No notable events documented.  Last Vitals:  Vitals:   06/24/23 1054 06/24/23 1100  BP: 115/69 117/67  Pulse: 94 93  Resp: 18 18  Temp: 36.4 C   SpO2: 100% 98%    Last Pain:  Vitals:   06/24/23 1054  TempSrc:   PainSc: Asleep                 Malasha Kleppe A.

## 2023-06-24 NOTE — Interval H&P Note (Signed)
 History and Physical Interval Note:  06/24/2023 9:11 AM  [name]  has presented today for surgery, with the diagnosis of Left hip wound drainage.  The various methods of treatment have been discussed with the patient and family. After consideration of risks, benefits and other options for treatment, the patient has consented to  Procedure(s): IRRIGATION AND DEBRIDEMENT WOUND AND WOUND VAC PLACEMENT HIP (Left) as a surgical intervention.  The patient's history has been reviewed, patient examined, no change in status, stable for surgery.  I have reviewed the patient's chart and labs.  Questions were answered to the patient's satisfaction.     Plumer Mittelstaedt P Foster Frericks

## 2023-06-24 NOTE — Plan of Care (Signed)

## 2023-06-24 NOTE — Transfer of Care (Signed)
 Immediate Anesthesia Transfer of Care Note  Patient: [name]  Procedure(s) Performed: IRRIGATION AND DEBRIDEMENT WOUND AND WOUND VAC PLACEMENT HIP (Left)  Patient Location: PACU  Anesthesia Type:General  Level of Consciousness: awake and drowsy  Airway & Oxygen Therapy: Patient Spontanous Breathing and Patient connected to face mask oxygen  Post-op Assessment: Report given to RN and Post -op Vital signs reviewed and stable  Post vital signs: Reviewed and stable  Last Vitals:  Vitals Value Taken Time  BP 115/69 06/24/23 1054  Temp 36.4 C 06/24/23 1054  Pulse 96 06/24/23 1057  Resp 17 06/24/23 1057  SpO2 100 % 06/24/23 1057  Vitals shown include unfiled device data.  Last Pain:  Vitals:   06/24/23 0752  TempSrc:   PainSc: 0-No pain      Patients Stated Pain Goal: 0 (06/24/23 0752)  Complications: No notable events documented.

## 2023-06-25 ENCOUNTER — Encounter (HOSPITAL_COMMUNITY): Payer: Self-pay | Admitting: Student

## 2023-06-25 LAB — CBC
HCT: 33.3 % — ABNORMAL LOW (ref 36.0–46.0)
Hemoglobin: 10.1 g/dL — ABNORMAL LOW (ref 12.0–15.0)
MCH: 23.6 pg — ABNORMAL LOW (ref 26.0–34.0)
MCHC: 30.3 g/dL (ref 30.0–36.0)
MCV: 77.8 fL — ABNORMAL LOW (ref 80.0–100.0)
Platelets: 226 10*3/uL (ref 150–400)
RBC: 4.28 MIL/uL (ref 3.87–5.11)
RDW: 15.1 % (ref 11.5–15.5)
WBC: 3.8 10*3/uL — ABNORMAL LOW (ref 4.0–10.5)
nRBC: 0 % (ref 0.0–0.2)

## 2023-06-25 LAB — BASIC METABOLIC PANEL WITH GFR
Anion gap: 6 (ref 5–15)
BUN: 16 mg/dL (ref 6–20)
CO2: 25 mmol/L (ref 22–32)
Calcium: 8.4 mg/dL — ABNORMAL LOW (ref 8.9–10.3)
Chloride: 107 mmol/L (ref 98–111)
Creatinine, Ser: 0.66 mg/dL (ref 0.44–1.00)
GFR, Estimated: >60 mL/min (ref >60)
Glucose, Bld: 96 mg/dL (ref 70–99)
Potassium: 4.4 mmol/L (ref 3.5–5.1)
Sodium: 138 mmol/L (ref 135–145)

## 2023-06-25 NOTE — H&P (View-Only) (Signed)
 Orthopaedic Trauma Progress Note  SUBJECTIVE: Doing okay this morning.  Notes stinging pain over the left hip incision and some pressure through the abdomen.  We discussed this is likely related to wound VAC sponge placement.  Does note that her home dose Subutex  in combination with the oxycodone  and Toradol  have been helping with her pain.  Patient tolerating diet and fluids.  No chest pain. No SOB. No nausea/vomiting. No other complaints.  Is hopeful she will be able to discharge from the hospital before the weekend  Intraoperative wound cultures from 06/24/2023 pending based on previous intraoperative cultures from 05/21/2023, patient has been started on cefepime.  OBJECTIVE:  Vitals:   06/25/23 0409 06/25/23 0735  BP: 110/70 114/73  Pulse: 66 97  Resp: 17 17  Temp: 98.4 F (36.9 C) 98.8 F (37.1 C)  SpO2: 100% 100%    Opiates Today (MME): Today's  total administered Morphine  Milligram Equivalents: 15 Opiates Yesterday (MME): Yesterday's total administered Morphine  Milligram Equivalents: 535  General: Sitting up on the edge of the bed, no acute distress.  Pleasant and cooperative Respiratory: No increased work of breathing.  Operative Extremity (left lower extremity): Wound VAC with good seal and function.  About 200 mL of bloody/serosanguineous output.  Some tenderness around the hip as expected.  Nontender through the lower leg.  Healed ankle incisions.  Ankle dorsiflexion/plantarflexion intact.  Neurovascularly intact   LABS:  Results for orders placed or performed during the hospital encounter of 06/24/23 (from the past 24 hours)  Aerobic/Anaerobic Culture w Gram Stain (surgical/deep wound)     Status: None (Preliminary result)   Collection Time: 06/24/23 10:36 AM   Specimen: Leg, Left; Tissue  Result Value Ref Range   Specimen Description TISSUE    Special Requests B    Gram Stain      RARE WBC PRESENT, PREDOMINANTLY PMN RARE GRAM POSITIVE COCCI IN PAIRS Performed at [facility], [address] [place], [place] [address]    Culture PENDING    Report Status PENDING   Aerobic/Anaerobic Culture w Gram Stain (surgical/deep wound)     Status: None (Preliminary result)   Collection Time: 06/24/23 10:37 AM   Specimen: Leg, Left; Tissue  Result Value Ref Range   Specimen Description TISSUE    Special Requests LEFT LEG    Gram Stain      NO WBC SEEN NO ORGANISMS SEEN Performed at [facility], [address] [place], [place] [address]    Culture PENDING    Report Status PENDING     ASSESSMENT: [name] is a 28 y.o. female, 1 Day Post-Op s/p IRRIGATION AND DEBRIDEMENT WOUND AND WOUND VAC PLACEMENT LEFT HIP  CV/Blood loss: Hemoglobin 10.1 this morning.  Stable.  Hemodynamically stable  PLAN: Weightbearing: WBAT LLE ROM: Okay for motion as tolerated Incisional and dressing care: Dressings left intact until follow-up  Showering: Hold off on showering Orthopedic device(s): Wound Vac: LLE  Pain management:  1. Tylenol  1000 mg q 6 hours scheduled 2. Flexeril  10 mg 3 times daily  3. Oxycodone  5-10 mg q 4 hours PRN 4. Dilaudid  0.5-1 mg q 4 hours PRN 5. Subutex  8 mg twice daily  6. Gabapentin  300 mg 3 times daily 7. Toradol  15 mg every 6 hours x 5 days VTE prophylaxis: Aspirin, SCDs ID: Continue cefepime Foley/Lines:  No foley, KVO IVFs Dispo: PT/OT today.  Plan to return to OR tomorrow 06/26/2023 for wound VAC change to the left hip.  Please keep patient n.p.o. at midnight.  Written consent will be obtained.    Follow - up plan: TBD   Contact information:  Katheryne Pane MD, Alona Jamaica PA-C. After hours and holidays please check [email or web address] for group call information for [facility]   Edilia Gordon, PA-C [phone] (office) [email or web address]
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
## 2023-06-25 NOTE — Progress Notes (Signed)
 Orthopaedic Trauma Progress Note  SUBJECTIVE: Doing okay this morning.  Notes stinging pain over the left hip incision and some pressure through the abdomen.  We discussed this is likely related to wound VAC sponge placement.  Does note that her home dose Subutex  in combination with the oxycodone  and Toradol  have been helping with her pain.  Patient tolerating diet and fluids.  No chest pain. No SOB. No nausea/vomiting. No other complaints.  Is hopeful she will be able to discharge from the hospital before the weekend  Intraoperative wound cultures from 06/24/2023 pending based on previous intraoperative cultures from 05/21/2023, patient has been started on cefepime.  OBJECTIVE:  Vitals:   06/25/23 0409 06/25/23 0735  BP: 110/70 114/73  Pulse: 66 97  Resp: 17 17  Temp: 98.4 F (36.9 C) 98.8 F (37.1 C)  SpO2: 100% 100%    Opiates Today (MME): Today's  total administered Morphine  Milligram Equivalents: 15 Opiates Yesterday (MME): Yesterday's total administered Morphine  Milligram Equivalents: 535  General: Sitting up on the edge of the bed, no acute distress.  Pleasant and cooperative Respiratory: No increased work of breathing.  Operative Extremity (left lower extremity): Wound VAC with good seal and function.  About 200 mL of bloody/serosanguineous output.  Some tenderness around the hip as expected.  Nontender through the lower leg.  Healed ankle incisions.  Ankle dorsiflexion/plantarflexion intact.  Neurovascularly intact   LABS:  Results for orders placed or performed during the hospital encounter of 06/24/23 (from the past 24 hours)  Aerobic/Anaerobic Culture w Gram Stain (surgical/deep wound)     Status: None (Preliminary result)   Collection Time: 06/24/23 10:36 AM   Specimen: Leg, Left; Tissue  Result Value Ref Range   Specimen Description TISSUE    Special Requests B    Gram Stain      RARE WBC PRESENT, PREDOMINANTLY PMN RARE GRAM POSITIVE COCCI IN PAIRS Performed at [facility], [address] [place], [place] [address]    Culture PENDING    Report Status PENDING   Aerobic/Anaerobic Culture w Gram Stain (surgical/deep wound)     Status: None (Preliminary result)   Collection Time: 06/24/23 10:37 AM   Specimen: Leg, Left; Tissue  Result Value Ref Range   Specimen Description TISSUE    Special Requests LEFT LEG    Gram Stain      NO WBC SEEN NO ORGANISMS SEEN Performed at [facility], [address] [place], [place] [address]    Culture PENDING    Report Status PENDING     ASSESSMENT: [name] is a 28 y.o. female, 1 Day Post-Op s/p IRRIGATION AND DEBRIDEMENT WOUND AND WOUND VAC PLACEMENT LEFT HIP  CV/Blood loss: Hemoglobin 10.1 this morning.  Stable.  Hemodynamically stable  PLAN: Weightbearing: WBAT LLE ROM: Okay for motion as tolerated Incisional and dressing care: Dressings left intact until follow-up  Showering: Hold off on showering Orthopedic device(s): Wound Vac: LLE  Pain management:  1. Tylenol  1000 mg q 6 hours scheduled 2. Flexeril  10 mg 3 times daily  3. Oxycodone  5-10 mg q 4 hours PRN 4. Dilaudid  0.5-1 mg q 4 hours PRN 5. Subutex  8 mg twice daily  6. Gabapentin  300 mg 3 times daily 7. Toradol  15 mg every 6 hours x 5 days VTE prophylaxis: Aspirin, SCDs ID: Continue cefepime Foley/Lines:  No foley, KVO IVFs Dispo: PT/OT today.  Plan to return to OR tomorrow 06/26/2023 for wound VAC change to the left hip.  Please keep patient n.p.o. at midnight.  Written consent will be obtained.    Follow - up plan: TBD   Contact information:  Katheryne Pane MD, Alona Jamaica PA-C. After hours and holidays please check [email or web address] for group call information for [facility]   Edilia Gordon, PA-C [phone] (office) [email or web address]
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
## 2023-06-25 NOTE — Plan of Care (Addendum)
 100 ml OP from wound vac overnight. Pain managed with PRN pain medication   Problem: Education: Goal: Knowledge of General Education information will improve Description: Including pain rating scale, medication(s)/side effects and non-pharmacologic comfort measures Outcome: Progressing   Problem: Health Behavior/Discharge Planning: Goal: Ability to manage health-related needs will improve Outcome: Progressing   Problem: Clinical Measurements: Goal: Will remain free from infection Outcome: Progressing   Problem: Activity: Goal: Risk for activity intolerance will decrease Outcome: Progressing   Problem: Elimination: Goal: Will not experience complications related to bowel motility Outcome: Progressing Goal: Will not experience complications related to urinary retention Outcome: Progressing   Problem: Pain Managment: Goal: General experience of comfort will improve and/or be controlled Outcome: Progressing   Problem: Safety: Goal: Ability to remain free from injury will improve Outcome: Progressing   Problem: Skin Integrity: Goal: Risk for impaired skin integrity will decrease Outcome: Progressing

## 2023-06-26 ENCOUNTER — Inpatient Hospital Stay (HOSPITAL_COMMUNITY): Payer: MEDICAID | Admitting: Anesthesiology

## 2023-06-26 ENCOUNTER — Encounter (HOSPITAL_COMMUNITY)
Admission: RE | Disposition: A | Discharge status: Home / Self Care | Payer: Self-pay | Source: Home / Self Care | Attending: Student

## 2023-06-26 ENCOUNTER — Other Ambulatory Visit: Payer: Self-pay

## 2023-06-26 ENCOUNTER — Other Ambulatory Visit: Payer: Self-pay | Admitting: Student

## 2023-06-26 DIAGNOSIS — Z6841 Body Mass Index (BMI) 40.0 and over, adult: Secondary | ICD-10-CM

## 2023-06-26 DIAGNOSIS — E66813 Obesity, class 3: Secondary | ICD-10-CM | POA: Diagnosis not present

## 2023-06-26 DIAGNOSIS — F418 Other specified anxiety disorders: Secondary | ICD-10-CM | POA: Diagnosis not present

## 2023-06-26 DIAGNOSIS — T8149XA Infection following a procedure, other surgical site, initial encounter: Secondary | ICD-10-CM | POA: Diagnosis not present

## 2023-06-26 HISTORY — PX: INCISION AND DRAINAGE OF WOUND: SHX1803

## 2023-06-26 LAB — CBC
HCT: 30.5 % — ABNORMAL LOW (ref 36.0–46.0)
Hemoglobin: 9.3 g/dL — ABNORMAL LOW (ref 12.0–15.0)
MCH: 24 pg — ABNORMAL LOW (ref 26.0–34.0)
MCHC: 30.5 g/dL (ref 30.0–36.0)
MCV: 78.8 fL — ABNORMAL LOW (ref 80.0–100.0)
Platelets: 190 10*3/uL (ref 150–400)
RBC: 3.87 MIL/uL (ref 3.87–5.11)
RDW: 15.6 % — ABNORMAL HIGH (ref 11.5–15.5)
WBC: 3.8 10*3/uL — ABNORMAL LOW (ref 4.0–10.5)
nRBC: 0 % (ref 0.0–0.2)

## 2023-06-26 SURGERY — IRRIGATION AND DEBRIDEMENT WOUND
Anesthesia: General | Site: Hip | Laterality: Left

## 2023-06-26 MED ORDER — ROCURONIUM BROMIDE 10 MG/ML (PF) SYRINGE
PREFILLED_SYRINGE | INTRAVENOUS | Status: DC | PRN
  Administered 2023-06-26: 60 mg via INTRAVENOUS

## 2023-06-26 MED ORDER — ONDANSETRON HCL 4 MG/2ML IJ SOLN: 4.0000 mg | Freq: Once | INTRAMUSCULAR | Status: DC | PRN

## 2023-06-26 MED ORDER — FENTANYL CITRATE (PF) 250 MCG/5ML IJ SOLN
INTRAMUSCULAR | Status: AC
  Filled 2023-06-26: qty 5

## 2023-06-26 MED ORDER — VANCOMYCIN HCL 1000 MG IV SOLR
INTRAVENOUS | Status: AC
Start: 2023-06-26 — End: ?
  Filled 2023-06-26: qty 20

## 2023-06-26 MED ORDER — ORAL CARE MOUTH RINSE: 15.0000 mL | Freq: Once | OROMUCOSAL | Status: AC

## 2023-06-26 MED ORDER — FENTANYL CITRATE (PF) 100 MCG/2ML IJ SOLN
INTRAMUSCULAR | Status: AC
  Filled 2023-06-26: qty 2

## 2023-06-26 MED ORDER — PROPOFOL 10 MG/ML IV BOLUS
INTRAVENOUS | Status: DC | PRN
  Administered 2023-06-26: 170 mg via INTRAVENOUS

## 2023-06-26 MED ORDER — 0.9 % SODIUM CHLORIDE (POUR BTL) OPTIME
TOPICAL | Status: DC | PRN
  Administered 2023-06-26: 1000 mL

## 2023-06-26 MED ORDER — CYCLOBENZAPRINE HCL 10 MG PO TABS: 10.0000 mg | ORAL_TABLET | Freq: Three times a day (TID) | ORAL | 0 refills | Status: DC | PRN

## 2023-06-26 MED ORDER — MIDAZOLAM HCL 2 MG/2ML IJ SOLN
INTRAMUSCULAR | Status: AC
  Filled 2023-06-26: qty 2

## 2023-06-26 MED ORDER — SODIUM CHLORIDE 0.9 % IR SOLN
Status: DC | PRN
  Administered 2023-06-26: 3000 mL

## 2023-06-26 MED ORDER — CHLORHEXIDINE GLUCONATE 0.12 % MT SOLN: 15.0000 mL | Freq: Once | OROMUCOSAL | Status: AC

## 2023-06-26 MED ORDER — ASPIRIN 325 MG PO TABS: 325.0000 mg | ORAL_TABLET | Freq: Every day | ORAL | 0 refills | Status: AC

## 2023-06-26 MED ORDER — PROPOFOL 10 MG/ML IV BOLUS
INTRAVENOUS | Status: AC
  Filled 2023-06-26: qty 20

## 2023-06-26 MED ORDER — LACTATED RINGERS IV SOLN: INTRAVENOUS | Status: DC

## 2023-06-26 MED ORDER — SUGAMMADEX SODIUM 200 MG/2ML IV SOLN
INTRAVENOUS | Status: DC | PRN
  Administered 2023-06-26: 200 mg via INTRAVENOUS

## 2023-06-26 MED ORDER — AMISULPRIDE (ANTIEMETIC) 5 MG/2ML IV SOLN: 10.0000 mg | Freq: Once | INTRAVENOUS | Status: DC | PRN

## 2023-06-26 MED ORDER — OXYCODONE HCL 5 MG PO TABS: 5.0000 mg | ORAL_TABLET | Freq: Four times a day (QID) | ORAL | 0 refills | Status: DC | PRN

## 2023-06-26 MED ORDER — LIDOCAINE 2% (20 MG/ML) 5 ML SYRINGE
INTRAMUSCULAR | Status: DC | PRN
  Administered 2023-06-26: 100 mg via INTRAVENOUS

## 2023-06-26 MED ORDER — CHLORHEXIDINE GLUCONATE 0.12 % MT SOLN
OROMUCOSAL | Status: AC
  Administered 2023-06-26: 15 mL via OROMUCOSAL
  Filled 2023-06-26: qty 15

## 2023-06-26 MED ORDER — MIDAZOLAM HCL 2 MG/2ML IJ SOLN
INTRAMUSCULAR | Status: DC | PRN
  Administered 2023-06-26: 2 mg via INTRAVENOUS

## 2023-06-26 MED ORDER — FENTANYL CITRATE (PF) 100 MCG/2ML IJ SOLN
25.0000 ug | INTRAMUSCULAR | Status: DC | PRN
  Administered 2023-06-26 (×2): 50 ug via INTRAVENOUS

## 2023-06-26 MED ORDER — FENTANYL CITRATE (PF) 250 MCG/5ML IJ SOLN
INTRAMUSCULAR | Status: DC | PRN
  Administered 2023-06-26: 50 ug via INTRAVENOUS
  Administered 2023-06-26 (×2): 100 ug via INTRAVENOUS

## 2023-06-26 MED ORDER — DEXAMETHASONE SODIUM PHOSPHATE 10 MG/ML IJ SOLN
INTRAMUSCULAR | Status: DC | PRN
  Administered 2023-06-26: 5 mg via INTRAVENOUS

## 2023-06-26 SURGICAL SUPPLY — 42 items
BAG COUNTER SPONGE SURGICOUNT (BAG) ×1 IMPLANT
BNDG COHESIVE 4X5 TAN STRL LF (GAUZE/BANDAGES/DRESSINGS) ×1 IMPLANT
BNDG GAUZE DERMACEA FLUFF 4 (GAUZE/BANDAGES/DRESSINGS) ×2 IMPLANT
BRUSH SCRUB EZ PLAIN DRY (MISCELLANEOUS) ×2 IMPLANT
CHLORAPREP W/TINT 26 (MISCELLANEOUS) ×1 IMPLANT
COVER MAYO STAND STRL (DRAPES) ×1 IMPLANT
COVER SURGICAL LIGHT HANDLE (MISCELLANEOUS) ×2 IMPLANT
DRAPE SURG 17X23 STRL (DRAPES) ×1 IMPLANT
DRAPE SURG ORHT 6 SPLT 77X108 (DRAPES) ×1 IMPLANT
DRAPE U-SHAPE 47X51 STRL (DRAPES) ×1 IMPLANT
DRESSING VERAFLO CLEANS CC MED (GAUZE/BANDAGES/DRESSINGS) IMPLANT
DRSG ADAPTIC 3X8 NADH LF (GAUZE/BANDAGES/DRESSINGS) ×1 IMPLANT
ELECTRODE REM PT RTRN 9FT ADLT (ELECTROSURGICAL) IMPLANT
EVACUATOR 1/8 PVC DRAIN (DRAIN) IMPLANT
GAUZE SPONGE 4X4 12PLY STRL (GAUZE/BANDAGES/DRESSINGS) ×1 IMPLANT
GLOVE BIO SURGEON STRL SZ 6.5 (GLOVE) ×3 IMPLANT
GLOVE BIO SURGEON STRL SZ7.5 (GLOVE) ×4 IMPLANT
GLOVE BIOGEL PI IND STRL 6.5 (GLOVE) ×1 IMPLANT
GLOVE BIOGEL PI IND STRL 7.5 (GLOVE) ×1 IMPLANT
GOWN STRL REUS W/ TWL LRG LVL3 (GOWN DISPOSABLE) ×2 IMPLANT
KIT BASIN OR (CUSTOM PROCEDURE TRAY) ×1 IMPLANT
KIT TURNOVER KIT B (KITS) ×1 IMPLANT
MANIFOLD NEPTUNE II (INSTRUMENTS) ×1 IMPLANT
NS IRRIG 1000ML POUR BTL (IV SOLUTION) ×1 IMPLANT
PACK ORTHO EXTREMITY (CUSTOM PROCEDURE TRAY) ×1 IMPLANT
PAD ARMBOARD POSITIONER FOAM (MISCELLANEOUS) ×2 IMPLANT
PAD NEG PRESSURE SENSATRAC (MISCELLANEOUS) IMPLANT
PADDING CAST COTTON 6X4 STRL (CAST SUPPLIES) ×1 IMPLANT
POWDER MYRIAD MORCELLS 500MG (Miscellaneous) IMPLANT
SET HNDPC FAN SPRY TIP SCT (DISPOSABLE) IMPLANT
SPONGE T-LAP 18X18 ~~LOC~~+RFID (SPONGE) ×1 IMPLANT
SUT ETHILON 2 0 FS 18 (SUTURE) ×2 IMPLANT
SUT ETHILON 3 0 PS 1 (SUTURE) ×2 IMPLANT
SUT MON AB 2-0 CT1 36 (SUTURE) ×1 IMPLANT
SUT PDS AB 0 CT 36 (SUTURE) IMPLANT
SWAB CULTURE ESWAB REG 1ML (MISCELLANEOUS) IMPLANT
TOWEL GREEN STERILE (TOWEL DISPOSABLE) ×2 IMPLANT
TOWEL GREEN STERILE FF (TOWEL DISPOSABLE) ×1 IMPLANT
TUBE CONNECTING 12X1/4 (SUCTIONS) ×1 IMPLANT
UNDERPAD 30X36 HEAVY ABSORB (UNDERPADS AND DIAPERS) ×1 IMPLANT
WATER STERILE IRR 1000ML POUR (IV SOLUTION) ×1 IMPLANT
YANKAUER SUCT BULB TIP NO VENT (SUCTIONS) ×1 IMPLANT

## 2023-06-26 NOTE — Discharge Summary (Signed)
 Orthopaedic Trauma Service (OTS) Discharge Summary   Patient ID: [name] MRN: [id number] DOB/AGE: [birth year] 27 y.o.  Admit date: 06/24/2023 Discharge date: 06/26/2023  Admission Diagnoses:Left hip drainage/Morell Lavalle lesion    Discharge Diagnoses:  Principal Problem:   Wound infection   Past Medical History:  Diagnosis Date   Accidental overdose 04/13/2022   ADHD (attention deficit hyperactivity disorder)    Anesthesia complication    woke up fighting after tonsillectomy   Anxiety    Cholecystitis    Cocaine abuse (HCC)    Complication of anesthesia    when wakes up " freaks out- like panic attack"   Depression    Dysmenorrhea    Endometriosis    GAD (generalized anxiety disorder) 11/16/2018   GERD (gastroesophageal reflux disease)    Headache    migraines   Hyperlipidemia    Hypoglycemia    Involuntary commitment 11/18/2022   Major depressive disorder 11/16/2018   MDD (major depressive disorder) 12/09/2012   Morbid obesity with BMI of 40.0-44.9, adult (HCC)    Opioid withdrawal (HCC) 09/12/2022   Polysubstance abuse (HCC)    Pregnancy complicated by subutex  maintenance, antepartum (HCC)    Severe benzodiazepine use disorder (HCC)    Severe opioid use disorder (HCC)    Substance induced mood disorder (HCC)    Syncope    Tobacco abuse    Viral warts    hand   Vision abnormalities    wears glasses     Procedures Performed:  CPT 26990-Irrigation and debridement of left hip wound x2 CPT 97606-Wound vac placement left hip  Discharged Condition: stable  Hospital Course: Patient presented to [facility] on 06/24/2023 for scheduled procedure on left hip.  Patient was taken to the operating room by Dr. Curtiss Dowdy for the above procedure without complications. Was admitted to the orthopedic service postoperatively for IV antibiotics.  She began working with physical/occupational therapy starting on postoperative day 1.  Patient taken back to  the operating room on 06/26/2023 for repeat irrigation debridement and wound VAC change to left hip.  Again patient tolerated this well. On the afternoon of 06/26/2023, the patient was tolerating diet, working well with therapies, pain well controlled, vital signs stable, dressings clean, dry, intact and felt stable for discharge to home. Patient will follow up as below and knows to call with questions or concerns.     Consults: None  Significant Diagnostic Studies:   Results for orders placed or performed during the hospital encounter of 06/24/23 (from the past week)  Pregnancy, urine POC   Collection Time: 06/24/23  7:40 AM  Result Value Ref Range   Preg Test, Ur NEGATIVE NEGATIVE  Aerobic/Anaerobic Culture w Gram Stain (surgical/deep wound)   Collection Time: 06/24/23 10:36 AM   Specimen: Leg, Left; Tissue  Result Value Ref Range   Specimen Description TISSUE    Special Requests B    Gram Stain      RARE WBC PRESENT, PREDOMINANTLY PMN RARE GRAM POSITIVE COCCI IN PAIRS    Culture      RARE PREVOTELLA BIVIA BETA LACTAMASE POSITIVE Performed at [facility], [address], [place], [place] [address]    Report Status PENDING   Aerobic/Anaerobic Culture w Gram Stain (surgical/deep wound)   Collection Time: 06/24/23 10:37 AM   Specimen: Leg, Left; Tissue  Result Value Ref Range   Specimen Description TISSUE    Special Requests LEFT LEG    Gram Stain NO WBC SEEN NO ORGANISMS  SEEN     Culture      RARE PREVOTELLA BIVIA BETA LACTAMASE POSITIVE Performed at [facility], [address], [place], [place] [address]    Report Status PENDING   Basic metabolic panel   Collection Time: 06/25/23  6:41 AM  Result Value Ref Range   Sodium 138 135 - 145 mmol/L   Potassium 4.4 3.5 - 5.1 mmol/L   Chloride 107 98 - 111 mmol/L   CO2 25 22 - 32 mmol/L   Glucose, Bld 96 70 - 99 mg/dL   BUN 16 6 - 20 mg/dL   Creatinine, Ser 2.95 0.44 - 1.00 mg/dL   Calcium  8.4 (L) 8.9 - 10.3  mg/dL   GFR, Estimated >62 >13 mL/min   Anion gap 6 5 - 15  CBC   Collection Time: 06/25/23  6:41 AM  Result Value Ref Range   WBC 3.8 (L) 4.0 - 10.5 K/uL   RBC 4.28 3.87 - 5.11 MIL/uL   Hemoglobin 10.1 (L) 12.0 - 15.0 g/dL   HCT 08.6 (L) 57.8 - 46.9 %   MCV 77.8 (L) 80.0 - 100.0 fL   MCH 23.6 (L) 26.0 - 34.0 pg   MCHC 30.3 30.0 - 36.0 g/dL   RDW 62.9 52.8 - 41.3 %   Platelets 226 150 - 400 K/uL   nRBC 0.0 0.0 - 0.2 %  CBC   Collection Time: 06/26/23  6:33 AM  Result Value Ref Range   WBC 3.8 (L) 4.0 - 10.5 K/uL   RBC 3.87 3.87 - 5.11 MIL/uL   Hemoglobin 9.3 (L) 12.0 - 15.0 g/dL   HCT 24.4 (L) 01.0 - 27.2 %   MCV 78.8 (L) 80.0 - 100.0 fL   MCH 24.0 (L) 26.0 - 34.0 pg   MCHC 30.5 30.0 - 36.0 g/dL   RDW 53.6 (H) 64.4 - 03.4 %   Platelets 190 150 - 400 K/uL   nRBC 0.0 0.0 - 0.2 %     Treatments: IV hydration, antibiotics: Ancef  and cefepime, analgesia: acetaminophen , Dilaudid , and oxycodone , therapies: PT and OT, and surgery: as above  Discharge Exam: General: Sitting up on the edge of the bed, no acute distress.  Pleasant and cooperative Respiratory: No increased work of breathing.  Operative Extremity (left lower extremity): Wound VAC with good seal and function.  About 200 mL of bloody/serosanguineous output.  Some tenderness around the hip as expected.  Nontender through the lower leg.  Healed ankle incisions.  Ankle dorsiflexion/plantarflexion intact.  Neurovascularly intact  Disposition: Discharge disposition: 01-Home or Self Care       Discharge Instructions     Call MD / Call 911   Complete by: As directed    If you experience chest pain or shortness of breath, CALL 911 and be transported to the hospital emergency room.  If you develope a fever above 101 F, pus (white drainage) or increased drainage or redness at the wound, or calf pain, call your surgeon's office.   Constipation Prevention   Complete by: As directed    Drink plenty of fluids.  Prune juice  may be helpful.  You may use a stool softener, such as Colace (over the counter) 100 mg twice a day.  Use MiraLax  (over the counter) for constipation as needed.   Diet - low sodium heart healthy   Complete by: As directed    Increase activity slowly as tolerated   Complete by: As directed    Post-operative opioid taper instructions:  Complete by: As directed    POST-OPERATIVE OPIOID TAPER INSTRUCTIONS: It is important to wean off of your opioid medication as soon as possible. If you do not need pain medication after your surgery it is ok to stop day one. Opioids include: Codeine, Hydrocodone (Norco, Vicodin), Oxycodone (Percocet, oxycontin ) and hydromorphone  amongst others.  Long term and even short term use of opiods can cause: Increased pain response Dependence Constipation Depression Respiratory depression And more.  Withdrawal symptoms can include Flu like symptoms Nausea, vomiting And more Techniques to manage these symptoms Hydrate well Eat regular healthy meals Stay active Use relaxation techniques(deep breathing, meditating, yoga) Do Not substitute Alcohol  to help with tapering If you have been on opioids for less than two weeks and do not have pain than it is ok to stop all together.  Plan to wean off of opioids This plan should start within one week post op of your joint replacement. Maintain the same interval or time between taking each dose and first decrease the dose.  Cut the total daily intake of opioids by one tablet each day Next start to increase the time between doses. The last dose that should be eliminated is the evening dose.         Allergies as of 06/26/2023       Reactions   Blueberry Fruit Extract Anaphylaxis   Contrast Media [iodinated Contrast Media] Anaphylaxis, Hives, Rash   Codeine Hives, Itching, Nausea And Vomiting   Latex Hives, Other (See Comments)   Welts, also   Omnipaque  [iohexol ] Hives, Itching, Nausea And Vomiting, Swelling    Robaxin  [methocarbamol ] Hives   Blueberry Flavoring Agent (non-screening) Rash        Medication List     STOP taking these medications    cephALEXin  500 MG capsule Commonly known as: KEFLEX    docusate sodium  100 MG capsule Commonly known as: COLACE   DULoxetine  30 MG capsule Commonly known as: CYMBALTA    Eliquis  2.5 MG Tabs tablet Generic drug: apixaban    FT Antibiotic ointment Generic drug: bacitracin        TAKE these medications    Acetaminophen  Extra Strength 500 MG Tabs Take 2 tablets (1,000 mg total) by mouth every 6 (six) hours.   aspirin 325 MG tablet Take 1 tablet (325 mg total) by mouth daily. Start taking on: Jun 27, 2023   buprenorphine  8 MG Subl SL tablet Commonly known as: SUBUTEX  Place 1 tablet (8 mg total) under the tongue 2 (two) times daily.   cyclobenzaprine  10 MG tablet Commonly known as: FLEXERIL  Take 1 tablet (10 mg total) by mouth 3 (three) times daily as needed for muscle spasms. What changed:  when to take this reasons to take this   gabapentin  300 MG capsule Commonly known as: NEURONTIN  Take 1 capsule (300 mg total) by mouth 3 (three) times daily.   hydrOXYzine  25 MG tablet Commonly known as: ATARAX  Take 1 tablet (25 mg total) by mouth 3 (three) times daily. What changed:  how much to take when to take this   naloxone  4 MG/0.1ML Liqd nasal spray kit Commonly known as: NARCAN  Place 1 spray into the nose daily as needed (overdose).   ondansetron  4 MG tablet Commonly known as: ZOFRAN  Take 4 mg by mouth every 8 (eight) hours as needed for nausea or vomiting.   oxyCODONE  5 MG immediate release tablet Commonly known as: Oxy IR/ROXICODONE  Take 1 tablet (5 mg total) by mouth every 6 (six) hours as needed for breakthrough pain (not relieved by tylenol ,  advil , robaxin ). What changed:  when to take this reasons to take this   polyethylene glycol powder 17 GM/SCOOP powder Commonly known as: GLYCOLAX /MIRALAX  Take 17 g by mouth  daily as needed (constipation).   QUEtiapine  100 MG tablet Commonly known as: SEROQUEL  Take 1 tablet (100 mg total) by mouth at bedtime. What changed: how much to take   vitamin D3 25 MCG tablet Commonly known as: CHOLECALCIFEROL  Take 2 tablets (2,000 Units total) by mouth 2 (two) times daily. What changed: when to take this               Durable Medical Equipment  (From admission, onward)           Start     Ordered   06/26/23 1520  For home use only DME Walker rolling  Once       Question Answer Comment  Walker: With 5 Inch Wheels   Patient needs a walker to treat with the following condition Weakness      06/26/23 1521   06/26/23 0953  For home use only DME Negative pressure wound device  Once       Comments: No wound vac changes needed at discharge. Plan to return to OR 07/03/23 for wound vac change  Question Answer Comment  Frequency of dressing change Other see comments   Length of need 3 Months   Dressing type Foam   Amount of suction 125 mm/Hg   Pressure application Continuous pressure   Supplies 10 canisters and 15 dressings per month for duration of therapy      06/26/23 0953            Follow-up Information     Haddix, Florentina Huntsman, MD. Go on 07/03/2023.   Specialty: Orthopedic Surgery Why: return for surgery at [facility] 07/03/23 Contact information: [address] [place] [address] [phone]                 Discharge Instructions and Plan: Patient will be discharged to home. Will continue on Levaquin for infection. Will be discharged on Aspirin for DVT prophylaxis. Patient has been provided with all the necessary DME for discharge. We will tentaively plan to have patient return to the OR on Friday 07/03/23 for wound vac change  Signed:  Edilia Gordon, PA-C ?[phone]? (phone) 06/26/2023, 9:24 PM  [facility] [address] [place] [address] [phone] (F)

## 2023-06-26 NOTE — Anesthesia Procedure Notes (Signed)
 Procedure Name: Intubation Date/Time: 06/26/2023 9:09 AM  Performed by: Emmitt Harp, CRNAPre-anesthesia Checklist: Patient identified, Emergency Drugs available, Suction available and Patient being monitored Patient Re-evaluated:Patient Re-evaluated prior to induction Oxygen Delivery Method: Circle System Utilized Preoxygenation: Pre-oxygenation with 100% oxygen Induction Type: IV induction Ventilation: Mask ventilation without difficulty Laryngoscope Size: Mac and 3 Grade View: Grade I Tube type: Oral Tube size: 7.0 mm Number of attempts: 1 Airway Equipment and Method: Stylet and Oral airway Placement Confirmation: ETT inserted through vocal cords under direct vision, positive ETCO2 and breath sounds checked- equal and bilateral Secured at: 22 cm Tube secured with: Tape Dental Injury: Teeth and Oropharynx as per pre-operative assessment

## 2023-06-26 NOTE — Transfer of Care (Signed)
 Immediate Anesthesia Transfer of Care Note  Patient: [name]  Procedure(s) Performed: IRRIGATION AND DEBRIDEMENT HIP WITH WOUND VAC CHANGE (Left: Hip)  Patient Location: PACU  Anesthesia Type:General  Level of Consciousness: awake, alert , and oriented  Airway & Oxygen Therapy: Patient Spontanous Breathing  Post-op Assessment: Report given to RN and Post -op Vital signs reviewed and stable  Post vital signs: Reviewed and stable  Last Vitals:  Vitals Value Taken Time  BP 127/75 06/26/23 0939  Temp    Pulse 96 06/26/23 0943  Resp 15 06/26/23 0943  SpO2 97 % 06/26/23 0943  Vitals shown include unfiled device data.  Last Pain:  Vitals:   06/26/23 0826  TempSrc:   PainSc: 7       Patients Stated Pain Goal: 0 (06/24/23 0752)  Complications: No notable events documented.

## 2023-06-26 NOTE — Plan of Care (Signed)
  Problem: Health Behavior/Discharge Planning: Goal: Ability to manage health-related needs will improve Outcome: Progressing   Problem: Nutrition: Goal: Adequate nutrition will be maintained Outcome: Progressing   Problem: Coping: Goal: Level of anxiety will decrease Outcome: Progressing   Problem: Safety: Goal: Ability to remain free from injury will improve Outcome: Progressing   

## 2023-06-26 NOTE — Anesthesia Preprocedure Evaluation (Addendum)
 Anesthesia Evaluation  Patient identified by MRN, date of birth, ID band Patient awake    Reviewed: Allergy & Precautions, NPO status , Patient's Chart, lab work & pertinent test results  Airway Mallampati: II  TM Distance: >3 FB Neck ROM: Full    Dental  (+) Dental Advisory Given, Poor Dentition, Missing   Pulmonary Current Smoker and Patient abstained from smoking.   Pulmonary exam normal breath sounds clear to auscultation       Cardiovascular negative cardio ROS Normal cardiovascular exam Rhythm:Regular Rate:Normal     Neuro/Psych  Headaches PSYCHIATRIC DISORDERS Anxiety Depression       GI/Hepatic ,GERD  ,,(+)     substance abuse  cocaine use and IV drug use  Endo/Other    Class 3 obesity  Renal/GU negative Renal ROS     Musculoskeletal negative musculoskeletal ROS (+)  narcotic dependentLeft hip infection   Abdominal   Peds  (+) ADHD Hematology negative hematology ROS (+)   Anesthesia Other Findings Day of surgery medications reviewed with the patient.  Reproductive/Obstetrics                             Anesthesia Physical Anesthesia Plan  ASA: 3  Anesthesia Plan: General   Post-op Pain Management: Tylenol  PO (pre-op)* and Toradol  IV (intra-op)*   Induction: Intravenous  PONV Risk Score and Plan: 2 and Midazolam , Dexamethasone  and Ondansetron   Airway Management Planned: Oral ETT  Additional Equipment:   Intra-op Plan:   Post-operative Plan: Extubation in OR  Informed Consent: I have reviewed the patients History and Physical, chart, labs and discussed the procedure including the risks, benefits and alternatives for the proposed anesthesia with the patient or authorized representative who has indicated his/her understanding and acceptance.     Dental advisory given  Plan Discussed with: CRNA  Anesthesia Plan Comments:        Anesthesia Quick Evaluation

## 2023-06-26 NOTE — Discharge Instructions (Signed)
 [facility] Discharge Instructions   General Discharge Instructions  WEIGHT BEARING STATUS:Weightbearing as tolerated left leg   RANGE OF MOTION/ACTIVITY: ok for gentle hip motion as tolerated  Wound Care: Keep wound vac in place until follow-up  DVT/PE prophylaxis: Aspirin  Diet: as you were eating previously.  Can use over the counter stool softeners and bowel preparations, such as Miralax , to help with bowel movements.  Narcotics can be constipating.  Be sure to drink plenty of fluids  PAIN MEDICATION USE AND EXPECTATIONS  You have likely been given narcotic medications to help control your pain.  After a traumatic event that results in an fracture (broken bone) with or without surgery, it is ok to use narcotic pain medications to help control one's pain.  We understand that everyone responds to pain differently and each individual patient will be evaluated on a regular basis for the continued need for narcotic medications. Ideally, narcotic medication use should last no more than 6-8 weeks (coinciding with fracture healing).   As a patient it is your responsibility as well to monitor narcotic medication use and report the amount and frequency you use these medications when you come to your office visit.   We would also advise that if you are using narcotic medications, you should take a dose prior to therapy to maximize you participation.  IF YOU ARE ON NARCOTIC MEDICATIONS IT IS NOT PERMISSIBLE TO OPERATE A MOTOR VEHICLE (MOTORCYCLE/CAR/TRUCK/MOPED) OR HEAVY MACHINERY DO NOT MIX NARCOTICS WITH OTHER CNS (CENTRAL NERVOUS SYSTEM) DEPRESSANTS SUCH AS ALCOHOL   POST-OPERATIVE OPIOID TAPER INSTRUCTIONS: It is important to wean off of your opioid medication as soon as possible. If you do not need pain medication after your surgery it is ok to stop day one. Opioids include: Codeine, Hydrocodone (Norco, Vicodin), Oxycodone (Percocet, oxycontin ) and hydromorphone  amongst others.   Long term and even short term use of opiods can cause: Increased pain response Dependence Constipation Depression Respiratory depression And more.  Withdrawal symptoms can include Flu like symptoms Nausea, vomiting And more Techniques to manage these symptoms Hydrate well Eat regular healthy meals Stay active Use relaxation techniques(deep breathing, meditating, yoga) Do Not substitute Alcohol  to help with tapering If you have been on opioids for less than two weeks and do not have pain than it is ok to stop all together.  Plan to wean off of opioids This plan should start within one week post op of your fracture surgery  Maintain the same interval or time between taking each dose and first decrease the dose.  Cut the total daily intake of opioids by one tablet each day Next start to increase the time between doses. The last dose that should be eliminated is the evening dose.    STOP SMOKING OR USING NICOTINE  PRODUCTS!!!!  As discussed nicotine  severely impairs your body's ability to heal surgical and traumatic wounds but also impairs bone healing.  Wounds and bone heal by forming microscopic blood vessels (angiogenesis) and nicotine  is a vasoconstrictor (essentially, shrinks blood vessels).  Therefore, if vasoconstriction occurs to these microscopic blood vessels they essentially disappear and are unable to deliver necessary nutrients to the healing tissue.  This is one modifiable factor that you can do to dramatically increase your chances of healing your injury.  (This means no smoking, no nicotine  gum, patches, etc)  DO NOT USE NONSTEROIDAL ANTI-INFLAMMATORY DRUGS (NSAID'S)  Using products such as Advil  (ibuprofen ), Aleve  (naproxen ), Motrin  (ibuprofen ) for additional pain control during fracture healing can delay and/or prevent the  healing response.  If you would like to take over the counter (OTC) medication, Tylenol  (acetaminophen ) is ok.  However, some narcotic medications that  are given for pain control contain acetaminophen  as well. Therefore, you should not exceed more than 4000 mg of tylenol  in a day if you do not have liver disease.  Also note that there are may OTC medicines, such as cold medicines and allergy medicines that my contain tylenol  as well.  If you have any questions about medications and/or interactions please ask your doctor/PA or your pharmacist.      ICE AND ELEVATE INJURED/OPERATIVE EXTREMITY  Using ice and elevating the injured extremity above your heart can help with swelling and pain control.  Icing in a pulsatile fashion, such as 20 minutes on and 20 minutes off, can be followed.    Do not place ice directly on skin. Make sure there is a barrier between to skin and the ice pack.    Using frozen items such as frozen peas works well as the conform nicely to the are that needs to be iced.  USE AN ACE WRAP OR TED HOSE FOR SWELLING CONTROL  In addition to icing and elevation, Ace wraps or TED hose are used to help limit and resolve swelling.  It is recommended to use Ace wraps or TED hose until you are informed to stop.    When using Ace Wraps start the wrapping distally (farthest away from the body) and wrap proximally (closer to the body)   Example: If you had surgery on your leg or thing and you do not have a splint on, start the ace wrap at the toes and work your way up to the thigh        If you had surgery on your upper extremity and do not have a splint on, start the ace wrap at your fingers and work your way up to the upper arm   CALL THE OFFICE FOR MEDICATION REFILLS OR WITH ANY QUESTIONS/CONCERNS: [phone]   VISIT OUR WEBSITE FOR ADDITIONAL INFORMATION: [email or web address]     Call office for the following: Temperature greater than 101F Persistent nausea and vomiting Severe uncontrolled pain Redness, tenderness, or signs of infection (pain, swelling, redness, odor or green/yellow discharge around the site) Difficulty breathing,  headache or visual disturbances Hives Persistent dizziness or light-headedness Extreme fatigue Any other questions or concerns you may have after discharge  In an emergency, call 911 or go to an Emergency Department at a nearby hospital  OTHER HELPFUL INFORMATION  If you had a block, it will wear off between 8-24 hrs postop typically.  This is period when your pain may go from nearly zero to the pain you would have had postop without the block.  This is an abrupt transition but nothing dangerous is happening.  You may take an extra dose of narcotic when this happens.  You should wean off your narcotic medicines as soon as you are able.  Most patients will be off or using minimal narcotics before their first postop appointment.   We suggest you use the pain medication the first night prior to going to bed, in order to ease any pain when the anesthesia wears off. You should avoid taking pain medications on an empty stomach as it will make you nauseous.  Do not drink alcoholic beverages or take illicit drugs when taking pain medications.  In most states it is against the law to drive while you are in a splint or sling.  And certainly against the law to drive while taking narcotics.  You may return to work/school in the next couple of days when you feel up to it.   Pain medication may make you constipated.  Below are a few solutions to try in this order: Decrease the amount of pain medication if you aren't having pain. Drink lots of decaffeinated fluids. Drink prune juice and/or each dried prunes  If the first 3 don't work start with additional solutions Take Colace - an over-the-counter stool softener Take Senokot - an over-the-counter laxative Take Miralax  - a stronger over-the-counter laxative
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
## 2023-06-26 NOTE — Interval H&P Note (Signed)
 History and Physical Interval Note:  06/26/2023 8:21 AM  [name]  has presented today for surgery, with the diagnosis of Left hip infection.  The various methods of treatment have been discussed with the patient and family. After consideration of risks, benefits and other options for treatment, the patient has consented to  Procedure(s): IRRIGATION AND DEBRIDEMENT HIP WITH WOUND VAC CHANGE (Left) as a surgical intervention.  The patient's history has been reviewed, patient examined, no change in status, stable for surgery.  I have reviewed the patient's chart and labs.  Questions were answered to the patient's satisfaction.     Fryda Molenda P Merritt Mccravy

## 2023-06-26 NOTE — Anesthesia Postprocedure Evaluation (Signed)
 Anesthesia Post Note  Patient: PORTLAND SARINANA  Procedure(s) Performed: IRRIGATION AND DEBRIDEMENT HIP WITH WOUND VAC CHANGE (Left: Hip)     Patient location during evaluation: PACU Anesthesia Type: General Level of consciousness: awake and alert Pain management: pain level controlled Vital Signs Assessment: post-procedure vital signs reviewed and stable Respiratory status: spontaneous breathing, nonlabored ventilation and respiratory function stable Cardiovascular status: blood pressure returned to baseline and stable Postop Assessment: no apparent nausea or vomiting Anesthetic complications: no   No notable events documented.  Last Vitals:  Vitals:   06/26/23 1015 06/26/23 1047  BP: (!) 111/58 114/66  Pulse: 76 71  Resp: 14 15  Temp: 36.5 C 36.6 C  SpO2: 98% 100%    Last Pain:  Vitals:   06/26/23 1047  TempSrc: Oral  PainSc:                  Erin Havers

## 2023-06-26 NOTE — Progress Notes (Signed)
 PT Cancellation Note  Patient Details Name: [name] MRN: [id number] DOB: 08/24/1995   Cancelled Treatment:    Reason Eval/Treat Not Completed: Patient at procedure or test/unavailable - I&D  Shirlene Doughty, PT DPT Acute Rehabilitation Services Secure Chat Preferred  Office [phone]    Sim Choquette Cydney Draft 06/26/2023, 8:20 AM
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
## 2023-06-27 LAB — AEROBIC/ANAEROBIC CULTURE W GRAM STAIN (SURGICAL/DEEP WOUND): Gram Stain: NONE SEEN

## 2023-06-29 ENCOUNTER — Ambulatory Visit: Payer: Self-pay | Admitting: Student

## 2023-06-29 ENCOUNTER — Encounter (HOSPITAL_COMMUNITY): Payer: Self-pay | Admitting: Student

## 2023-07-02 ENCOUNTER — Encounter (HOSPITAL_COMMUNITY): Payer: Self-pay | Admitting: Student

## 2023-07-02 ENCOUNTER — Other Ambulatory Visit: Payer: Self-pay

## 2023-07-02 NOTE — Progress Notes (Signed)
 SDW call  Patient was given pre-op instructions over the phone. Patient verbalized understanding of instructions provided.     PCP - denies Cardiologist -  Pulmonary:    PPM/ICD - denies Device Orders - na Rep Notified - na   Chest x-ray - 06/16/2023 EKG -  11/19/2022 Stress Test - ECHO -  Cardiac Cath -   Sleep Study/sleep apnea/CPAP: denies  Non-diabetic  Blood Thinner Instructions: Eliquis  is still on hold Aspirin  Instructions: per surgeon's instructions   ERAS Protcol - Clears until 0725   Anesthesia review: Difficult airway. Reviewed by Dr. Yvonnie Heritage 4/23 and 5/27 DOS   Patient denies shortness of breath, fever, cough and chest pain over the phone call  Your procedure is scheduled on Friday July 03, 2023  Report to [facility] Main Entrance "A" at 0755   A.M., then check in with the Admitting office.  Call this number if you have problems the morning of surgery:  [phone]   If you have any questions prior to your surgery date call [phone]: Open Monday-Friday 8am-4pm If you experience any cold or flu symptoms such as cough, fever, chills, shortness of breath, etc. between now and your scheduled surgery, please notify us  at the above number    Remember:  Do not eat after midnight the night before your surgery  You may drink clear liquids until 0725    the morning of your surgery.   Clear liquids allowed are: Water , Non-Citrus Juices (without pulp), Carbonated Beverages, Clear Tea, Black Coffee ONLY (NO MILK, CREAM OR POWDERED CREAMER of any kind), and Gatorade   Take these medicines the morning of surgery with A SIP OF WATER :  Tylenol , subutex  per prescribers instructions, gabapentin , hydroxyzine   As needed: Flexeril , oxycodone   As of today, STOP taking any Aleve  Naproxen , Ibuprofen , Motrin , Advil , Goody's, BC's, all herbal medications, fish oil, and all vitamins.

## 2023-07-03 ENCOUNTER — Ambulatory Visit (HOSPITAL_COMMUNITY): Payer: MEDICAID | Admitting: Certified Registered Nurse Anesthetist

## 2023-07-03 ENCOUNTER — Ambulatory Visit (HOSPITAL_COMMUNITY)
Admission: RE | Admit: 2023-07-03 | Discharge: 2023-07-03 | Disposition: A | Discharge status: Home / Self Care | Payer: MEDICAID | Attending: Student | Admitting: Student

## 2023-07-03 ENCOUNTER — Encounter (HOSPITAL_COMMUNITY)
Admission: RE | Disposition: A | Discharge status: Home / Self Care | Payer: Self-pay | Source: Home / Self Care | Attending: Student

## 2023-07-03 ENCOUNTER — Other Ambulatory Visit: Payer: Self-pay

## 2023-07-03 DIAGNOSIS — F418 Other specified anxiety disorders: Secondary | ICD-10-CM

## 2023-07-03 DIAGNOSIS — F411 Generalized anxiety disorder: Secondary | ICD-10-CM | POA: Insufficient documentation

## 2023-07-03 DIAGNOSIS — S71002A Unspecified open wound, left hip, initial encounter: Secondary | ICD-10-CM | POA: Diagnosis present

## 2023-07-03 HISTORY — PX: APPLICATION OF WOUND VAC: SHX5189

## 2023-07-03 LAB — POCT PREGNANCY, URINE
Preg Test, Ur: NEGATIVE
Preg Test, Ur: NEGATIVE

## 2023-07-03 SURGERY — APPLICATION, WOUND VAC
Anesthesia: General | Laterality: Left

## 2023-07-03 MED ORDER — FENTANYL CITRATE (PF) 250 MCG/5ML IJ SOLN
INTRAMUSCULAR | Status: DC | PRN
  Administered 2023-07-03 (×2): 50 ug via INTRAVENOUS

## 2023-07-03 MED ORDER — DEXAMETHASONE SODIUM PHOSPHATE 10 MG/ML IJ SOLN
INTRAMUSCULAR | Status: AC
Start: 2023-07-03 — End: ?
  Filled 2023-07-03: qty 1

## 2023-07-03 MED ORDER — MIDAZOLAM HCL 2 MG/2ML IJ SOLN
INTRAMUSCULAR | Status: DC | PRN
  Administered 2023-07-03: 2 mg via INTRAVENOUS

## 2023-07-03 MED ORDER — OXYCODONE HCL 5 MG PO TABS: 5.0000 mg | ORAL_TABLET | Freq: Four times a day (QID) | ORAL | 0 refills | Status: DC | PRN

## 2023-07-03 MED ORDER — PROPOFOL 10 MG/ML IV BOLUS
INTRAVENOUS | Status: DC | PRN
  Administered 2023-07-03: 200 mg via INTRAVENOUS
  Administered 2023-07-03: 100 mg via INTRAVENOUS

## 2023-07-03 MED ORDER — ACETAMINOPHEN 10 MG/ML IV SOLN: 1000.0000 mg | Freq: Once | INTRAVENOUS | Status: DC | PRN

## 2023-07-03 MED ORDER — FENTANYL CITRATE (PF) 250 MCG/5ML IJ SOLN
INTRAMUSCULAR | Status: AC
  Filled 2023-07-03: qty 5

## 2023-07-03 MED ORDER — OXYCODONE HCL 5 MG PO TABS: 5.0000 mg | ORAL_TABLET | Freq: Once | ORAL | Status: DC | PRN

## 2023-07-03 MED ORDER — FENTANYL CITRATE (PF) 100 MCG/2ML IJ SOLN: 25.0000 ug | INTRAMUSCULAR | Status: DC | PRN

## 2023-07-03 MED ORDER — CHLORHEXIDINE GLUCONATE 0.12 % MT SOLN
OROMUCOSAL | Status: AC
  Administered 2023-07-03: 15 mL via OROMUCOSAL
  Filled 2023-07-03: qty 15

## 2023-07-03 MED ORDER — MIDAZOLAM HCL 2 MG/2ML IJ SOLN
INTRAMUSCULAR | Status: AC
  Filled 2023-07-03: qty 2

## 2023-07-03 MED ORDER — LIDOCAINE 2% (20 MG/ML) 5 ML SYRINGE
INTRAMUSCULAR | Status: AC
  Filled 2023-07-03: qty 5

## 2023-07-03 MED ORDER — 0.9 % SODIUM CHLORIDE (POUR BTL) OPTIME
TOPICAL | Status: DC | PRN
  Administered 2023-07-03: 1000 mL

## 2023-07-03 MED ORDER — DEXMEDETOMIDINE HCL IN NACL 80 MCG/20ML IV SOLN
INTRAVENOUS | Status: DC | PRN
  Administered 2023-07-03: 12 ug via INTRAVENOUS
  Administered 2023-07-03: 8 ug via INTRAVENOUS

## 2023-07-03 MED ORDER — LIDOCAINE HCL (CARDIAC) PF 100 MG/5ML IV SOSY
PREFILLED_SYRINGE | INTRAVENOUS | Status: DC | PRN
  Administered 2023-07-03: 40 mg via INTRAVENOUS

## 2023-07-03 MED ORDER — OXYCODONE HCL 5 MG/5ML PO SOLN: 5.0000 mg | Freq: Once | ORAL | Status: DC | PRN

## 2023-07-03 MED ORDER — CEFAZOLIN SODIUM-DEXTROSE 2-4 GM/100ML-% IV SOLN
2.0000 g | INTRAVENOUS | Status: AC
  Administered 2023-07-03: 2 g via INTRAVENOUS
  Filled 2023-07-03: qty 100

## 2023-07-03 MED ORDER — ONDANSETRON HCL 4 MG/2ML IJ SOLN
INTRAMUSCULAR | Status: AC
Start: 2023-07-03 — End: ?
  Filled 2023-07-03: qty 2

## 2023-07-03 MED ORDER — DEXAMETHASONE SODIUM PHOSPHATE 10 MG/ML IJ SOLN
INTRAMUSCULAR | Status: DC | PRN
  Administered 2023-07-03: 5 mg via INTRAVENOUS

## 2023-07-03 MED ORDER — DROPERIDOL 2.5 MG/ML IJ SOLN: 0.6250 mg | Freq: Once | INTRAMUSCULAR | Status: DC | PRN

## 2023-07-03 MED ORDER — ORAL CARE MOUTH RINSE: 15.0000 mL | Freq: Once | OROMUCOSAL | Status: AC

## 2023-07-03 MED ORDER — LEVOFLOXACIN 750 MG PO TABS: 750.0000 mg | ORAL_TABLET | Freq: Every day | ORAL | 0 refills | Status: AC

## 2023-07-03 MED ORDER — ONDANSETRON HCL 4 MG/2ML IJ SOLN
INTRAMUSCULAR | Status: DC | PRN
  Administered 2023-07-03: 4 mg via INTRAVENOUS

## 2023-07-03 MED ORDER — VANCOMYCIN HCL 1000 MG IV SOLR
INTRAVENOUS | Status: DC | PRN
  Administered 2023-07-03: 1000 mg via TOPICAL

## 2023-07-03 MED ORDER — CHLORHEXIDINE GLUCONATE 0.12 % MT SOLN: 15.0000 mL | Freq: Once | OROMUCOSAL | Status: AC

## 2023-07-03 MED ORDER — LACTATED RINGERS IV SOLN: INTRAVENOUS | Status: DC

## 2023-07-03 SURGICAL SUPPLY — 23 items
BAG COUNTER SPONGE SURGICOUNT (BAG) ×1 IMPLANT
CANISTER SUCTION 3000ML PPV (SUCTIONS) ×1 IMPLANT
CANISTER WOUND CARE 500ML ATS (WOUND CARE) ×1 IMPLANT
COVER SURGICAL LIGHT HANDLE (MISCELLANEOUS) ×1 IMPLANT
DRESSING VERAFLO CLEANSE CC (GAUZE/BANDAGES/DRESSINGS) IMPLANT
ELECTRODE REM PT RTRN 9FT ADLT (ELECTROSURGICAL) ×1 IMPLANT
GAUZE XEROFORM 5X9 LF (GAUZE/BANDAGES/DRESSINGS) ×1 IMPLANT
GLOVE BIO SURGEON STRL SZ 6.5 (GLOVE) ×3 IMPLANT
GLOVE BIO SURGEON STRL SZ7.5 (GLOVE) ×4 IMPLANT
GLOVE BIOGEL PI IND STRL 6.5 (GLOVE) ×1 IMPLANT
GLOVE BIOGEL PI IND STRL 7.5 (GLOVE) ×1 IMPLANT
GOWN STRL REUS W/ TWL LRG LVL3 (GOWN DISPOSABLE) ×2 IMPLANT
KIT BASIN OR (CUSTOM PROCEDURE TRAY) ×1 IMPLANT
KIT DRSG PREVENA PLUS 7DAY 125 (MISCELLANEOUS) IMPLANT
KIT TURNOVER KIT B (KITS) ×1 IMPLANT
NS IRRIG 1000ML POUR BTL (IV SOLUTION) ×1 IMPLANT
PACK ORTHO EXTREMITY (CUSTOM PROCEDURE TRAY) ×1 IMPLANT
PAD ARMBOARD POSITIONER FOAM (MISCELLANEOUS) ×1 IMPLANT
PAD NEG PRESSURE SENSATRAC (MISCELLANEOUS) IMPLANT
TOWEL GREEN STERILE (TOWEL DISPOSABLE) ×1 IMPLANT
TOWEL GREEN STERILE FF (TOWEL DISPOSABLE) ×1 IMPLANT
TUBE CONNECTING 12X1/4 (SUCTIONS) ×1 IMPLANT
YANKAUER SUCT BULB TIP NO VENT (SUCTIONS) ×1 IMPLANT

## 2023-07-03 NOTE — Anesthesia Preprocedure Evaluation (Addendum)
 Anesthesia Evaluation  Patient identified by MRN, date of birth, ID band Patient awake    Reviewed: Allergy & Precautions, NPO status , Patient's Chart, lab work & pertinent test results  Airway Mallampati: III  TM Distance: >3 FB Neck ROM: Full    Dental  (+) Dental Advisory Given, Missing, Chipped, Poor Dentition   Pulmonary Current Smoker and Patient abstained from smoking.   breath sounds clear to auscultation       Cardiovascular negative cardio ROS  Rhythm:Regular Rate:Normal     Neuro/Psych  Headaches PSYCHIATRIC DISORDERS Anxiety Depression       GI/Hepatic Neg liver ROS,GERD  ,,  Endo/Other  negative endocrine ROS    Renal/GU negative Renal ROS     Musculoskeletal negative musculoskeletal ROS (+)    Abdominal   Peds  Hematology  (+) Blood dyscrasia, anemia   Anesthesia Other Findings   Reproductive/Obstetrics                             Anesthesia Physical Anesthesia Plan  ASA: 3  Anesthesia Plan: General   Post-op Pain Management: Tylenol  PO (pre-op)*   Induction: Intravenous  PONV Risk Score and Plan: 3 and Ondansetron , Dexamethasone  and Midazolam   Airway Management Planned: LMA  Additional Equipment: None  Intra-op Plan:   Post-operative Plan: Extubation in OR  Informed Consent: I have reviewed the patients History and Physical, chart, labs and discussed the procedure including the risks, benefits and alternatives for the proposed anesthesia with the patient or authorized representative who has indicated his/her understanding and acceptance.     Dental advisory given  Plan Discussed with: CRNA  Anesthesia Plan Comments:        Anesthesia Quick Evaluation

## 2023-07-03 NOTE — Interval H&P Note (Signed)
 History and Physical Interval Note:  07/03/2023 9:48 AM  [name]  has presented today for surgery, with the diagnosis of Left hip wound.  The various methods of treatment have been discussed with the patient and family. After consideration of risks, benefits and other options for treatment, the patient has consented to  Procedure(s): APPLICATION, WOUND VAC (Left) as a surgical intervention.  The patient's history has been reviewed, patient examined, no change in status, stable for surgery.  I have reviewed the patient's chart and labs.  Questions were answered to the patient's satisfaction.     Estie Sproule P Hanaan Gancarz

## 2023-07-03 NOTE — Discharge Instructions (Addendum)
 [facility] Discharge Instructions   General Discharge Instructions  WEIGHT BEARING STATUS:weightbearing as tolerated  RANGE OF MOTION/ACTIVITY: Hip motion as tolerated  Wound Care: Hold off on showering. Change wound vac canister as needed  DVT/PE prophylaxis: Aspirin   Diet: as you were eating previously.  Can use over the counter stool softeners and bowel preparations, such as Miralax , to help with bowel movements.  Narcotics can be constipating.  Be sure to drink plenty of fluids  PAIN MEDICATION USE AND EXPECTATIONS  You have likely been given narcotic medications to help control your pain.  After a traumatic event that results in an fracture (broken bone) with or without surgery, it is ok to use narcotic pain medications to help control one's pain.  We understand that everyone responds to pain differently and each individual patient will be evaluated on a regular basis for the continued need for narcotic medications. Ideally, narcotic medication use should last no more than 6-8 weeks (coinciding with fracture healing).   As a patient it is your responsibility as well to monitor narcotic medication use and report the amount and frequency you use these medications when you come to your office visit.   We would also advise that if you are using narcotic medications, you should take a dose prior to therapy to maximize you participation.  IF YOU ARE ON NARCOTIC MEDICATIONS IT IS NOT PERMISSIBLE TO OPERATE A MOTOR VEHICLE (MOTORCYCLE/CAR/TRUCK/MOPED) OR HEAVY MACHINERY DO NOT MIX NARCOTICS WITH OTHER CNS (CENTRAL NERVOUS SYSTEM) DEPRESSANTS SUCH AS ALCOHOL   POST-OPERATIVE OPIOID TAPER INSTRUCTIONS: It is important to wean off of your opioid medication as soon as possible. If you do not need pain medication after your surgery it is ok to stop day one. Opioids include: Codeine, Hydrocodone (Norco, Vicodin), Oxycodone (Percocet, oxycontin ) and hydromorphone  amongst others.   Long term and even short term use of opiods can cause: Increased pain response Dependence Constipation Depression Respiratory depression And more.  Withdrawal symptoms can include Flu like symptoms Nausea, vomiting And more Techniques to manage these symptoms Hydrate well Eat regular healthy meals Stay active Use relaxation techniques(deep breathing, meditating, yoga) Do Not substitute Alcohol  to help with tapering If you have been on opioids for less than two weeks and do not have pain than it is ok to stop all together.  Plan to wean off of opioids This plan should start within one week post op of your fracture surgery  Maintain the same interval or time between taking each dose and first decrease the dose.  Cut the total daily intake of opioids by one tablet each day Next start to increase the time between doses. The last dose that should be eliminated is the evening dose.    STOP SMOKING OR USING NICOTINE  PRODUCTS!!!!  As discussed nicotine  severely impairs your body's ability to heal surgical and traumatic wounds but also impairs bone healing.  Wounds and bone heal by forming microscopic blood vessels (angiogenesis) and nicotine  is a vasoconstrictor (essentially, shrinks blood vessels).  Therefore, if vasoconstriction occurs to these microscopic blood vessels they essentially disappear and are unable to deliver necessary nutrients to the healing tissue.  This is one modifiable factor that you can do to dramatically increase your chances of healing your injury.  (This means no smoking, no nicotine  gum, patches, etc)  DO NOT USE NONSTEROIDAL ANTI-INFLAMMATORY DRUGS (NSAID'S)  Using products such as Advil  (ibuprofen ), Aleve  (naproxen ), Motrin  (ibuprofen ) for additional pain control during fracture healing can delay and/or prevent the healing response.  If you would like to take over the counter (OTC) medication, Tylenol  (acetaminophen ) is ok.  However, some narcotic medications that  are given for pain control contain acetaminophen  as well. Therefore, you should not exceed more than 4000 mg of tylenol  in a day if you do not have liver disease.  Also note that there are may OTC medicines, such as cold medicines and allergy medicines that my contain tylenol  as well.  If you have any questions about medications and/or interactions please ask your doctor/PA or your pharmacist.      ICE AND ELEVATE INJURED/OPERATIVE EXTREMITY  Using ice and elevating the injured extremity above your heart can help with swelling and pain control.  Icing in a pulsatile fashion, such as 20 minutes on and 20 minutes off, can be followed.    Do not place ice directly on skin. Make sure there is a barrier between to skin and the ice pack.    Using frozen items such as frozen peas works well as the conform nicely to the are that needs to be iced.  USE AN ACE WRAP OR TED HOSE FOR SWELLING CONTROL  In addition to icing and elevation, Ace wraps or TED hose are used to help limit and resolve swelling.  It is recommended to use Ace wraps or TED hose until you are informed to stop.    When using Ace Wraps start the wrapping distally (farthest away from the body) and wrap proximally (closer to the body)   Example: If you had surgery on your leg or thing and you do not have a splint on, start the ace wrap at the toes and work your way up to the thigh        If you had surgery on your upper extremity and do not have a splint on, start the ace wrap at your fingers and work your way up to the upper arm  CALL THE OFFICE FOR MEDICATION REFILLS OR WITH ANY QUESTIONS/CONCERNS: [phone]   VISIT OUR WEBSITE FOR ADDITIONAL INFORMATION: [email or web address]    Discharge Wound Care Instructions  Do NOT apply any ointments, solutions or lotions to pin sites or surgical wounds.  These prevent needed drainage and even though solutions like hydrogen peroxide kill bacteria, they also damage cells lining the pin sites that  help fight infection.  Applying lotions or ointments can keep the wounds moist and can cause them to breakdown and open up as well. This can increase the risk for infection. When in doubt call the office.  Surgical incisions should be dressed daily.  If any drainage is noted, use one layer of adaptic or Mepitel, then gauze, Kerlix, and an ace wrap. - These dressing supplies should be available at local medical supply stores ([facility], [facility], etc) as well as Insurance claims handler ([facility], [facility], [employer], etc)  Once the incision is completely dry and without drainage, it may be left open to air out.  Showering may begin 36-48 hours later.  Cleaning gently with soap and water .  Traumatic wounds should be dressed daily as well.    One layer of adaptic, gauze, Kerlix, then ace wrap.  The adaptic can be discontinued once the draining has ceased    If you have a wet to dry dressing: wet the gauze with saline the squeeze as much saline out so the gauze is moist (not soaking wet), place moistened gauze over wound, then place a dry gauze over the moist one, followed by Kerlix wrap, then ace wrap.  Call office for the following: Temperature greater than 101F Persistent nausea and vomiting Severe uncontrolled pain Redness, tenderness, or signs of infection (pain, swelling, redness, odor or green/yellow discharge around the site) Difficulty breathing, headache or visual disturbances Hives Persistent dizziness or light-headedness Extreme fatigue Any other questions or concerns you may have after discharge  In an emergency, call 911 or go to an Emergency Department at a nearby hospital  OTHER HELPFUL INFORMATION  If you had a block, it will wear off between 8-24 hrs postop typically.  This is period when your pain may go from nearly zero to the pain you would have had postop without the block.  This is an abrupt transition but nothing dangerous is happening.  You may take an extra dose  of narcotic when this happens.  You should wean off your narcotic medicines as soon as you are able.  Most patients will be off or using minimal narcotics before their first postop appointment.   We suggest you use the pain medication the first night prior to going to bed, in order to ease any pain when the anesthesia wears off. You should avoid taking pain medications on an empty stomach as it will make you nauseous.  Do not drink alcoholic beverages or take illicit drugs when taking pain medications.  In most states it is against the law to drive while you are in a splint or sling.  And certainly against the law to drive while taking narcotics.  You may return to work/school in the next couple of days when you feel up to it.   Pain medication may make you constipated.  Below are a few solutions to try in this order: Decrease the amount of pain medication if you aren't having pain. Drink lots of decaffeinated fluids. Drink prune juice and/or each dried prunes  If the first 3 don't work start with additional solutions Take Colace - an over-the-counter stool softener Take Senokot - an over-the-counter laxative Take Miralax  - a stronger over-the-counter laxative

## 2023-07-03 NOTE — Anesthesia Procedure Notes (Signed)
 Procedure Name: LMA Insertion Date/Time: 07/03/2023 10:34 AM  Performed by: Grier Leber, CRNAPre-anesthesia Checklist: Patient identified, Emergency Drugs available, Suction available and Patient being monitored Patient Re-evaluated:Patient Re-evaluated prior to induction Oxygen Delivery Method: Circle System Utilized Preoxygenation: Pre-oxygenation with 100% oxygen Induction Type: IV induction Ventilation: Mask ventilation without difficulty LMA: LMA inserted LMA Size: 4.0 Number of attempts: 1 Placement Confirmation: positive ETCO2 Tube secured with: Tape Dental Injury: Teeth and Oropharynx as per pre-operative assessment

## 2023-07-03 NOTE — Transfer of Care (Signed)
 Immediate Anesthesia Transfer of Care Note  Patient: [name]  Procedure(s) Performed: APPLICATION, WOUND VAC (Left)  Patient Location: PACU  Anesthesia Type:General  Level of Consciousness: drowsy  Airway & Oxygen Therapy: Patient Spontanous Breathing and Patient connected to face mask oxygen  Post-op Assessment: Report given to RN and Post -op Vital signs reviewed and stable  Post vital signs: Reviewed and stable  Last Vitals:  Vitals Value Taken Time  BP 106/64 07/03/23 1100  Temp 97.9   Pulse 99   Resp 16 07/03/23 1104  SpO2 100   Vitals shown include unfiled device data.  Last Pain:  Vitals:   07/03/23 0909  TempSrc:   PainSc: 5       Patients Stated Pain Goal: 2 (07/03/23 0909)  Complications: No notable events documented.

## 2023-07-03 NOTE — Anesthesia Postprocedure Evaluation (Signed)
 Anesthesia Post Note  Patient: [name]  Procedure(s) Performed: APPLICATION, WOUND VAC (Left)     Patient location during evaluation: PACU Anesthesia Type: General Level of consciousness: awake and alert Pain management: pain level controlled Vital Signs Assessment: post-procedure vital signs reviewed and stable Respiratory status: spontaneous breathing, nonlabored ventilation, respiratory function stable and patient connected to nasal cannula oxygen Cardiovascular status: blood pressure returned to baseline and stable Postop Assessment: no apparent nausea or vomiting Anesthetic complications: no  No notable events documented.  Last Vitals:  Vitals:   07/03/23 1115 07/03/23 1130  BP: 102/67 106/66  Pulse: 97 98  Resp: 16 15  Temp:  36.5 C  SpO2: 100% 100%    Last Pain:  Vitals:   07/03/23 1130  TempSrc:   PainSc: 0-No pain                 Willian Harrow
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
## 2023-07-04 ENCOUNTER — Encounter (HOSPITAL_COMMUNITY): Payer: Self-pay | Admitting: Student

## 2023-07-05 ENCOUNTER — Ambulatory Visit: Payer: Self-pay | Admitting: Student

## 2023-07-05 NOTE — Anesthesia Preprocedure Evaluation (Signed)
 Anesthesia Evaluation  Patient identified by MRN, date of birth, ID band Patient awake    Reviewed: Allergy & Precautions, NPO status , Patient's Chart, lab work & pertinent test results  Airway Mallampati: III  TM Distance: >3 FB Neck ROM: Full    Dental no notable dental hx. (+) Dental Advisory Given, Missing, Chipped, Poor Dentition   Pulmonary Current Smoker and Patient abstained from smoking.   Pulmonary exam normal breath sounds clear to auscultation       Cardiovascular negative cardio ROS Normal cardiovascular exam Rhythm:Regular Rate:Normal     Neuro/Psych  Headaches PSYCHIATRIC DISORDERS Anxiety Depression       GI/Hepatic Neg liver ROS,GERD  ,,  Endo/Other  negative endocrine ROS    Renal/GU negative Renal ROS     Musculoskeletal negative musculoskeletal ROS (+)    Abdominal   Peds  Hematology  (+) Blood dyscrasia, anemia   Anesthesia Other Findings All: Latex codeine robaxin   Reproductive/Obstetrics                             Anesthesia Physical Anesthesia Plan  ASA: 3  Anesthesia Plan: General   Post-op Pain Management: Tylenol  PO (pre-op)*   Induction: Intravenous  PONV Risk Score and Plan: 3 and Ondansetron , Dexamethasone  and Midazolam   Airway Management Planned: LMA  Additional Equipment: None  Intra-op Plan:   Post-operative Plan: Extubation in OR  Informed Consent: I have reviewed the patients History and Physical, chart, labs and discussed the procedure including the risks, benefits and alternatives for the proposed anesthesia with the patient or authorized representative who has indicated his/her understanding and acceptance.     Dental advisory given  Plan Discussed with: CRNA and Surgeon  Anesthesia Plan Comments:        Anesthesia Quick Evaluation

## 2023-07-05 NOTE — Progress Notes (Signed)
 INSTRUCTIONS ONLY  Patient verbally denies any shortness of breath, fever, cough and chest pain during phone call   -------------  SDW INSTRUCTIONS given:  Your procedure is scheduled on Monday, June 9th.  Report to [facility] Main Entrance "A" at 0530 A.M., and check in at the Admitting office.  Call this number if you have problems the morning of surgery:  [phone]   Remember:  Do not eat after midnight the night before your surgery  You may drink clear liquids until 0430 the morning of your surgery.   Clear liquids allowed are: Water , Non-Citrus Juices (without pulp), Carbonated Beverages, Clear Tea, Black Coffee Only, and Gatorade    Take these medicines the morning of surgery with A SIP OF WATER   buprenorphine  (SUBUTEX )-per Dr's instructions cyclobenzaprine  (FLEXERIL )  gabapentin  (NEURONTIN )  hydrOXYzine  (ATARAX )  levofloxacin  (LEVAQUIN )  acetaminophen  (TYLENOL )-if needed ondansetron  (ZOFRAN )-if needed oxyCODONE -if needed  As of today, STOP taking any Aspirin  (unless otherwise instructed by your surgeon) Aleve , Naproxen , Ibuprofen , Motrin , Advil , Goody's, BC's, all herbal medications, fish oil, and all vitamins.                      Do not wear jewelry, make up, or nail polish            Do not wear lotions, powders, perfumes/colognes, or deodorant.            Do not shave 48 hours prior to surgery.  Men may shave face and neck.            Do not bring valuables to the hospital.            [facility] is not responsible for any belongings or valuables.  Do NOT Smoke (Tobacco/Vaping) 24 hours prior to your procedure If you use a CPAP at night, you may bring all equipment for your overnight stay.   Contacts, glasses, dentures or bridgework may not be worn into surgery.      For patients admitted to the hospital, discharge time will be determined by your treatment team.   Patients discharged the day of surgery will not be allowed to drive home, and someone needs to  stay with them for 24 hours.    Special instructions:   [place]- Preparing For Surgery  Before surgery, you can play an important role. Because skin is not sterile, your skin needs to be as free of germs as possible. You can reduce the number of germs on your skin by washing with CHG (chlorahexidine gluconate) Soap before surgery.  CHG is an antiseptic cleaner which kills germs and bonds with the skin to continue killing germs even after washing.    Oral Hygiene is also important to reduce your risk of infection.  Remember - BRUSH YOUR TEETH THE MORNING OF SURGERY WITH YOUR REGULAR TOOTHPASTE  Please do not use if you have an allergy to CHG or antibacterial soaps. If your skin becomes reddened/irritated stop using the CHG.  Do not shave (including legs and underarms) for at least 48 hours prior to first CHG shower. It is OK to shave your face.  Please follow these instructions carefully.   Shower the NIGHT BEFORE SURGERY and the MORNING OF SURGERY with DIAL Soap.   Pat yourself dry with a CLEAN TOWEL.  Wear CLEAN PAJAMAS to bed the night before surgery  Place CLEAN SHEETS on your bed the night of your first shower and DO NOT SLEEP WITH PETS.   Day of Surgery:  Please shower morning of surgery  Wear Clean/Comfortable clothing the morning of surgery Do not apply any deodorants/lotions.   Remember to brush your teeth WITH YOUR REGULAR TOOTHPASTE.   Questions were answered. Patient verbalized understanding of instructions.

## 2023-07-06 ENCOUNTER — Encounter (HOSPITAL_COMMUNITY): Payer: Self-pay | Admitting: Student

## 2023-07-06 ENCOUNTER — Other Ambulatory Visit: Payer: Self-pay

## 2023-07-06 ENCOUNTER — Ambulatory Visit (HOSPITAL_COMMUNITY)
Admission: RE | Admit: 2023-07-06 | Discharge: 2023-07-06 | Disposition: A | Discharge status: Home / Self Care | Payer: MEDICAID | Source: Ambulatory Visit | Attending: Student | Admitting: Student

## 2023-07-06 ENCOUNTER — Inpatient Hospital Stay (HOSPITAL_COMMUNITY): Payer: MEDICAID | Admitting: Anesthesiology

## 2023-07-06 ENCOUNTER — Encounter (HOSPITAL_COMMUNITY)
Admission: RE | Disposition: A | Discharge status: Home / Self Care | Payer: Self-pay | Source: Ambulatory Visit | Attending: Student

## 2023-07-06 DIAGNOSIS — F419 Anxiety disorder, unspecified: Secondary | ICD-10-CM | POA: Diagnosis not present

## 2023-07-06 DIAGNOSIS — T8131XA Disruption of external operation (surgical) wound, not elsewhere classified, initial encounter: Secondary | ICD-10-CM | POA: Diagnosis present

## 2023-07-06 DIAGNOSIS — F418 Other specified anxiety disorders: Secondary | ICD-10-CM

## 2023-07-06 DIAGNOSIS — F17210 Nicotine dependence, cigarettes, uncomplicated: Secondary | ICD-10-CM | POA: Diagnosis not present

## 2023-07-06 DIAGNOSIS — Y838 Other surgical procedures as the cause of abnormal reaction of the patient, or of later complication, without mention of misadventure at the time of the procedure: Secondary | ICD-10-CM | POA: Diagnosis not present

## 2023-07-06 DIAGNOSIS — S71002A Unspecified open wound, left hip, initial encounter: Secondary | ICD-10-CM | POA: Insufficient documentation

## 2023-07-06 DIAGNOSIS — F32A Depression, unspecified: Secondary | ICD-10-CM | POA: Diagnosis not present

## 2023-07-06 HISTORY — PX: APPLICATION OF WOUND VAC: SHX5189

## 2023-07-06 LAB — POCT PREGNANCY, URINE: Preg Test, Ur: NEGATIVE

## 2023-07-06 SURGERY — APPLICATION, WOUND VAC
Anesthesia: General | Site: Hip | Laterality: Left

## 2023-07-06 MED ORDER — HYDROMORPHONE HCL 1 MG/ML IJ SOLN: 0.2500 mg | INTRAMUSCULAR | Status: DC | PRN

## 2023-07-06 MED ORDER — ONDANSETRON HCL 4 MG/2ML IJ SOLN
INTRAMUSCULAR | Status: DC | PRN
  Administered 2023-07-06: 4 mg via INTRAVENOUS

## 2023-07-06 MED ORDER — LIDOCAINE 2% (20 MG/ML) 5 ML SYRINGE
INTRAMUSCULAR | Status: AC
  Filled 2023-07-06: qty 5

## 2023-07-06 MED ORDER — CHLORHEXIDINE GLUCONATE 0.12 % MT SOLN: 15.0000 mL | OROMUCOSAL | Status: AC

## 2023-07-06 MED ORDER — DEXMEDETOMIDINE HCL IN NACL 80 MCG/20ML IV SOLN
INTRAVENOUS | Status: DC | PRN
Start: 2023-07-06 — End: 2023-07-06
  Administered 2023-07-06: 12 ug via INTRAVENOUS

## 2023-07-06 MED ORDER — LIDOCAINE 2% (20 MG/ML) 5 ML SYRINGE
INTRAMUSCULAR | Status: DC | PRN
  Administered 2023-07-06: 100 mg via INTRAVENOUS

## 2023-07-06 MED ORDER — LACTATED RINGERS IV SOLN: INTRAVENOUS | Status: DC | PRN

## 2023-07-06 MED ORDER — MIDAZOLAM HCL 2 MG/2ML IJ SOLN
INTRAMUSCULAR | Status: AC
  Filled 2023-07-06: qty 2

## 2023-07-06 MED ORDER — PROPOFOL 10 MG/ML IV BOLUS
INTRAVENOUS | Status: DC | PRN
  Administered 2023-07-06: 200 mg via INTRAVENOUS

## 2023-07-06 MED ORDER — OXYCODONE HCL 5 MG/5ML PO SOLN: 5.0000 mg | Freq: Once | ORAL | Status: DC | PRN

## 2023-07-06 MED ORDER — MIDAZOLAM HCL 2 MG/2ML IJ SOLN
INTRAMUSCULAR | Status: DC | PRN
  Administered 2023-07-06: 2 mg via INTRAVENOUS

## 2023-07-06 MED ORDER — 0.9 % SODIUM CHLORIDE (POUR BTL) OPTIME
TOPICAL | Status: DC | PRN
  Administered 2023-07-06: 1000 mL

## 2023-07-06 MED ORDER — FENTANYL CITRATE (PF) 250 MCG/5ML IJ SOLN
INTRAMUSCULAR | Status: DC | PRN
Start: 2023-07-06 — End: 2023-07-06
  Administered 2023-07-06: 50 ug via INTRAVENOUS

## 2023-07-06 MED ORDER — ONDANSETRON HCL 4 MG/2ML IJ SOLN: 4.0000 mg | Freq: Once | INTRAMUSCULAR | Status: DC | PRN

## 2023-07-06 MED ORDER — VANCOMYCIN HCL 1000 MG IV SOLR
INTRAVENOUS | Status: AC
  Filled 2023-07-06: qty 20

## 2023-07-06 MED ORDER — CHLORHEXIDINE GLUCONATE 0.12 % MT SOLN
OROMUCOSAL | Status: AC
  Administered 2023-07-06: 15 mL via OROMUCOSAL
  Filled 2023-07-06: qty 15

## 2023-07-06 MED ORDER — PHENYLEPHRINE HCL (PRESSORS) 10 MG/ML IV SOLN
INTRAVENOUS | Status: DC | PRN
  Administered 2023-07-06: 80 ug via INTRAVENOUS

## 2023-07-06 MED ORDER — FENTANYL CITRATE (PF) 250 MCG/5ML IJ SOLN
INTRAMUSCULAR | Status: AC
Start: 2023-07-06 — End: ?
  Filled 2023-07-06: qty 5

## 2023-07-06 MED ORDER — PROPOFOL 10 MG/ML IV BOLUS
INTRAVENOUS | Status: AC
  Filled 2023-07-06: qty 20

## 2023-07-06 MED ORDER — OXYCODONE HCL 5 MG PO TABS: 5.0000 mg | ORAL_TABLET | Freq: Once | ORAL | Status: DC | PRN

## 2023-07-06 SURGICAL SUPPLY — 33 items
BAG COUNTER SPONGE SURGICOUNT (BAG) ×1 IMPLANT
BNDG ELASTIC 4X5.8 VLCR STR LF (GAUZE/BANDAGES/DRESSINGS) IMPLANT
BNDG GAUZE DERMACEA FLUFF 4 (GAUZE/BANDAGES/DRESSINGS) IMPLANT
CANISTER SUCTION 3000ML PPV (SUCTIONS) ×1 IMPLANT
CANISTER WOUND CARE 500ML ATS (WOUND CARE) ×1 IMPLANT
COVER SURGICAL LIGHT HANDLE (MISCELLANEOUS) ×1 IMPLANT
DRAPE HALF SHEET 40X57 (DRAPES) IMPLANT
DRAPE INCISE IOBAN 66X45 STRL (DRAPES) IMPLANT
DRAPE SURG ORHT 6 SPLT 77X108 (DRAPES) IMPLANT
DRESSING VERAFLO CLEANS CC MED (GAUZE/BANDAGES/DRESSINGS) IMPLANT
DRSG VAC GRANUFOAM LG (GAUZE/BANDAGES/DRESSINGS) IMPLANT
DRSG VAC GRANUFOAM MED (GAUZE/BANDAGES/DRESSINGS) IMPLANT
DRSG VAC GRANUFOAM SM (GAUZE/BANDAGES/DRESSINGS) IMPLANT
ELECTRODE REM PT RTRN 9FT ADLT (ELECTROSURGICAL) ×1 IMPLANT
GAUZE PAD ABD 8X10 STRL (GAUZE/BANDAGES/DRESSINGS) IMPLANT
GAUZE SPONGE 4X4 12PLY STRL (GAUZE/BANDAGES/DRESSINGS) IMPLANT
GAUZE XEROFORM 5X9 LF (GAUZE/BANDAGES/DRESSINGS) ×1 IMPLANT
GLOVE BIO SURGEON STRL SZ 6.5 (GLOVE) ×3 IMPLANT
GLOVE BIO SURGEON STRL SZ7.5 (GLOVE) ×4 IMPLANT
GLOVE BIOGEL PI IND STRL 6.5 (GLOVE) ×1 IMPLANT
GLOVE BIOGEL PI IND STRL 7.5 (GLOVE) ×1 IMPLANT
GOWN STRL REUS W/ TWL LRG LVL3 (GOWN DISPOSABLE) ×2 IMPLANT
KIT BASIN OR (CUSTOM PROCEDURE TRAY) ×1 IMPLANT
KIT TURNOVER KIT B (KITS) ×1 IMPLANT
NS IRRIG 1000ML POUR BTL (IV SOLUTION) ×1 IMPLANT
PACK ORTHO EXTREMITY (CUSTOM PROCEDURE TRAY) ×1 IMPLANT
PAD ARMBOARD POSITIONER FOAM (MISCELLANEOUS) ×1 IMPLANT
POWDER MYRIAD MORCLLS FINE 500 (Miscellaneous) IMPLANT
SET HNDPC FAN SPRY TIP SCT (DISPOSABLE) IMPLANT
TOWEL GREEN STERILE (TOWEL DISPOSABLE) ×1 IMPLANT
TOWEL GREEN STERILE FF (TOWEL DISPOSABLE) ×1 IMPLANT
TUBE CONNECTING 12X1/4 (SUCTIONS) ×1 IMPLANT
YANKAUER SUCT BULB TIP NO VENT (SUCTIONS) ×1 IMPLANT

## 2023-07-06 NOTE — Anesthesia Procedure Notes (Signed)
 Procedure Name: LMA Insertion Date/Time: 07/06/2023 7:42 AM  Performed by: Katrinka Parr, CRNAPre-anesthesia Checklist: Patient identified, Emergency Drugs available, Suction available and Patient being monitored Patient Re-evaluated:Patient Re-evaluated prior to induction Oxygen Delivery Method: Circle System Utilized Preoxygenation: Pre-oxygenation with 100% oxygen Induction Type: IV induction Ventilation: Mask ventilation without difficulty LMA: LMA inserted LMA Size: 4.0 Number of attempts: 1 Airway Equipment and Method: Bite block Placement Confirmation: positive ETCO2 Tube secured with: Tape Dental Injury: Teeth and Oropharynx as per pre-operative assessment

## 2023-07-06 NOTE — Interval H&P Note (Signed)
 History and Physical Interval Note:  07/06/2023 7:23 AM  [name]  has presented today for surgery, with the diagnosis of Wound of left hip.  The various methods of treatment have been discussed with the patient and family. After consideration of risks, benefits and other options for treatment, the patient has consented to  Procedure(s): APPLICATION, WOUND VAC (Left) as a surgical intervention.  The patient's history has been reviewed, patient examined, no change in status, stable for surgery.  I have reviewed the patient's chart and labs.  Questions were answered to the patient's satisfaction.     Darica Goren P Alisah Grandberry

## 2023-07-06 NOTE — H&P (View-Only) (Signed)
 Patient had wound vac changed on Friday and unfortunately the wound vac started to leak and did not have good seal over night. She returns today for wound vac change. No changes in history or medical problems.  Laneta Pintos, MD [facility] [phone] (office) [email or web address]

## 2023-07-06 NOTE — Op Note (Signed)
 Orthopaedic Surgery Operative Note (CSN: [id number] ) Date of Surgery: 07/06/2023  Admit Date: 07/06/2023   Diagnoses: Pre-Op Diagnoses: Left hip wound/Morel Lavalle lesion   Post-Op Diagnosis: Same  Procedures: CPT 26990-Irrigation and debridement of left hip CPT 97606-Wound vac change left hip  Surgeons : Primary: Laneta Pintos, MD  Assistant: None  Location: OR 3   Anesthesia: General   Antibiotics: None  Tourniquet time: None    Estimated Blood Loss: Minimal  Complications:* No complications entered in OR log *   Specimens:* No specimens in log *   Implants: Implant Name Type Inv. Item Serial No. Manufacturer Lot No. LRB No. Used Action  POWDER [employer] FINE 500 - [id number] Miscellaneous POWDER [employer]INE 500  [employer] [id number] Left 1 Implanted     Indications for Surgery: 28 year old female who has been dealing with a left hip wound that we have been taken multiple times for irrigation debridement with wound VAC change.  She underwent wound VAC change on Friday but unfortunately the seal did not maintained over the weekend.  She is here for wound VAC change again.  Risks and benefits were discussed with the patient and her mother.  They agreed to proceed with surgery.  Operative Findings: 1.  Repeat debridement of left hip with wound VAC placement.  Procedure: The patient was identified in the preoperative holding area. Consent was confirmed with the patient and their family and all questions were answered. The operative extremity was marked after confirmation with the patient. she was then brought back to the operating room by our anesthesia colleagues.  She was carefully transferred over to radiolucent flattop table.  She was placed under general anesthetic.  The hip was then prepped and draped in usual sterile fashion.  A timeout was performed to verify the patient, the procedure, and the extremity.  Preoperative antibiotics were  not dosed due her being on antibiotics at home.  Remove the wound VAC sponge.  Used a Cobb elevator to debride the wound area.  The size was similar to last time.  I then decided to irrigate the wound with 1 L of normal saline.  I then placed 500 mg of myriad Morcells powder to help stimulate granulation tissue.  I then placed the wound VAC back into the cavity.  I then proceeded to connected to the wound VAC machine and I got a good seal and reinforced the edges.  The patient was then awoke from anesthesia and taken to the PACU in stable condition.  Post Op Plan/Instructions: Patient will be weightbearing as tolerated to the left lower extremity.  She will discharge home from the PACU continue with antibiotics.  Probably return in about 1 week for wound VAC change in the operating room.  I was present and performed the entire surgery.  Katheryne Pane, MD [facility]

## 2023-07-06 NOTE — Progress Notes (Signed)
 Patient had wound vac changed on Friday and unfortunately the wound vac started to leak and did not have good seal over night. She returns today for wound vac change. No changes in history or medical problems.  Melinda Pintos, MD [facility] [phone] (office) [email or web address]

## 2023-07-06 NOTE — Transfer of Care (Signed)
 Immediate Anesthesia Transfer of Care Note  Patient: [name]  Procedure(s) Performed: WOUND VAC EXCHANGE (Left: Hip)  Patient Location: PACU  Anesthesia Type:General  Level of Consciousness: drowsy and patient cooperative  Airway & Oxygen Therapy: Patient Spontanous Breathing  Post-op Assessment: Report given to RN and Post -op Vital signs reviewed and stable  Post vital signs: Reviewed and stable  Last Vitals:  Vitals Value Taken Time  BP 101/47 07/06/23 0811  Temp    Pulse 89 07/06/23 0814  Resp 15 07/06/23 0814  SpO2 98 % 07/06/23 0814  Vitals shown include unfiled device data.  Last Pain:  Vitals:   07/06/23 0718  TempSrc: Oral  PainSc:       Patients Stated Pain Goal: 0 (07/06/23 0606)  Complications: No notable events documented.

## 2023-07-06 NOTE — Discharge Instructions (Signed)
 [facility] Discharge Instructions   General Discharge Instructions  WEIGHT BEARING STATUS:Weightbearing as tolerated  RANGE OF MOTION/ACTIVITY: Gentle hip motion as tolerated  Wound Care:  Maintain dressing over the hip. Change wound vac canister as needed. Avoid getting adhesive dressing over the hip wet  DVT/PE prophylaxis: Aspirin   Diet: as you were eating previously.  Can use over the counter stool softeners and bowel preparations, such as Miralax , to help with bowel movements.  Narcotics can be constipating.  Be sure to drink plenty of fluids  PAIN MEDICATION USE AND EXPECTATIONS  You have likely been given narcotic medications to help control your pain.  After a traumatic event that results in an fracture (broken bone) with or without surgery, it is ok to use narcotic pain medications to help control one's pain.  We understand that everyone responds to pain differently and each individual patient will be evaluated on a regular basis for the continued need for narcotic medications. Ideally, narcotic medication use should last no more than 6-8 weeks (coinciding with fracture healing).   As a patient it is your responsibility as well to monitor narcotic medication use and report the amount and frequency you use these medications when you come to your office visit.   We would also advise that if you are using narcotic medications, you should take a dose prior to therapy to maximize you participation.  IF YOU ARE ON NARCOTIC MEDICATIONS IT IS NOT PERMISSIBLE TO OPERATE A MOTOR VEHICLE (MOTORCYCLE/CAR/TRUCK/MOPED) OR HEAVY MACHINERY DO NOT MIX NARCOTICS WITH OTHER CNS (CENTRAL NERVOUS SYSTEM) DEPRESSANTS SUCH AS ALCOHOL   POST-OPERATIVE OPIOID TAPER INSTRUCTIONS: It is important to wean off of your opioid medication as soon as possible. If you do not need pain medication after your surgery it is ok to stop day one. Opioids include: Codeine, Hydrocodone (Norco, Vicodin),  Oxycodone (Percocet, oxycontin ) and hydromorphone  amongst others.  Long term and even short term use of opiods can cause: Increased pain response Dependence Constipation Depression Respiratory depression And more.  Withdrawal symptoms can include Flu like symptoms Nausea, vomiting And more Techniques to manage these symptoms Hydrate well Eat regular healthy meals Stay active Use relaxation techniques(deep breathing, meditating, yoga) Do Not substitute Alcohol  to help with tapering If you have been on opioids for less than two weeks and do not have pain than it is ok to stop all together.  Plan to wean off of opioids This plan should start within one week post op of your fracture surgery  Maintain the same interval or time between taking each dose and first decrease the dose.  Cut the total daily intake of opioids by one tablet each day Next start to increase the time between doses. The last dose that should be eliminated is the evening dose.    STOP SMOKING OR USING NICOTINE  PRODUCTS!!!!  As discussed nicotine  severely impairs your body's ability to heal surgical and traumatic wounds but also impairs bone healing.  Wounds and bone heal by forming microscopic blood vessels (angiogenesis) and nicotine  is a vasoconstrictor (essentially, shrinks blood vessels).  Therefore, if vasoconstriction occurs to these microscopic blood vessels they essentially disappear and are unable to deliver necessary nutrients to the healing tissue.  This is one modifiable factor that you can do to dramatically increase your chances of healing your injury.  (This means no smoking, no nicotine  gum, patches, etc)  DO NOT USE NONSTEROIDAL ANTI-INFLAMMATORY DRUGS (NSAID'S)  Using products such as Advil  (ibuprofen ), Aleve  (naproxen ), Motrin  (ibuprofen ) for additional pain control  during fracture healing can delay and/or prevent the healing response.  If you would like to take over the counter (OTC) medication,  Tylenol  (acetaminophen ) is ok.  However, some narcotic medications that are given for pain control contain acetaminophen  as well. Therefore, you should not exceed more than 4000 mg of tylenol  in a day if you do not have liver disease.  Also note that there are may OTC medicines, such as cold medicines and allergy medicines that my contain tylenol  as well.  If you have any questions about medications and/or interactions please ask your doctor/PA or your pharmacist.      ICE AND ELEVATE INJURED/OPERATIVE EXTREMITY  Using ice and elevating the injured extremity above your heart can help with swelling and pain control.  Icing in a pulsatile fashion, such as 20 minutes on and 20 minutes off, can be followed.    Do not place ice directly on skin. Make sure there is a barrier between to skin and the ice pack.    Using frozen items such as frozen peas works well as the conform nicely to the are that needs to be iced.  USE AN ACE WRAP OR TED HOSE FOR SWELLING CONTROL  In addition to icing and elevation, Ace wraps or TED hose are used to help limit and resolve swelling.  It is recommended to use Ace wraps or TED hose until you are informed to stop.    When using Ace Wraps start the wrapping distally (farthest away from the body) and wrap proximally (closer to the body)   Example: If you had surgery on your leg or thing and you do not have a splint on, start the ace wrap at the toes and work your way up to the thigh        If you had surgery on your upper extremity and do not have a splint on, start the ace wrap at your fingers and work your way up to the upper arm  CALL THE OFFICE FOR MEDICATION REFILLS OR WITH ANY QUESTIONS/CONCERNS: [phone]   VISIT OUR WEBSITE FOR ADDITIONAL INFORMATION: [email or web address]    Call office for the following: Temperature greater than 101F Persistent nausea and vomiting Severe uncontrolled pain Redness, tenderness, or signs of infection (pain, swelling, redness, odor  or green/yellow discharge around the site) Difficulty breathing, headache or visual disturbances Hives Persistent dizziness or light-headedness Extreme fatigue Any other questions or concerns you may have after discharge  In an emergency, call 911 or go to an Emergency Department at a nearby hospital

## 2023-07-06 NOTE — Anesthesia Postprocedure Evaluation (Signed)
 Anesthesia Post Note  Patient: [name]  Procedure(s) Performed: WOUND VAC EXCHANGE (Left: Hip)     Patient location during evaluation: PACU Anesthesia Type: General Level of consciousness: awake and alert Pain management: pain level controlled Vital Signs Assessment: post-procedure vital signs reviewed and stable Respiratory status: spontaneous breathing, nonlabored ventilation, respiratory function stable and patient connected to nasal cannula oxygen Cardiovascular status: blood pressure returned to baseline and stable Postop Assessment: no apparent nausea or vomiting Anesthetic complications: no  No notable events documented.  Last Vitals:  Vitals:   07/06/23 0845 07/06/23 0850  BP:  106/61  Pulse:  90  Resp:  14  Temp: 36.6 C   SpO2:  100%    Last Pain:  Vitals:   07/06/23 0845  TempSrc:   PainSc: 0-No pain                 Rosalita Combe

## 2023-07-07 ENCOUNTER — Encounter (HOSPITAL_COMMUNITY): Payer: Self-pay | Admitting: Student

## 2023-07-10 ENCOUNTER — Ambulatory Visit: Payer: Self-pay | Admitting: Student

## 2023-07-10 ENCOUNTER — Encounter (HOSPITAL_COMMUNITY): Payer: Self-pay | Admitting: Student

## 2023-07-10 NOTE — Progress Notes (Signed)
 PCP - none Cardiologist - denies  PPM/ICD - denies Device Orders -  Rep Notified -   Chest x-ray - 06/16/23 EKG - 11/19/22 Stress Test - none ECHO - none Cardiac Cath - none  Sleep Study - none CPAP -   Fasting Blood Sugar - na Checks Blood Sugar _____ times a day  Last dose of GLP1 agonist-  na GLP1 instructions:   Blood Thinner Instructions:na Aspirin  Instructions:  ERAS Protcol -no PRE-SURGERY Ensure or G2-   COVID TEST- na   Anesthesia review: no  All instructions explained to the patient's grandmother,[name], over the phone with a verbal understanding of the material. Per Ms. Turner patient does not have a working phone.The opportunity to ask questions was provided. [name] will relay instructions to her granddaughter, [name].

## 2023-07-11 ENCOUNTER — Inpatient Hospital Stay (HOSPITAL_COMMUNITY)
Admission: EM | Admit: 2023-07-11 | Discharge: 2023-07-12 | DRG: 071 | Discharge status: Against Medical Advice | Payer: MEDICAID | Attending: Internal Medicine | Admitting: Internal Medicine

## 2023-07-11 ENCOUNTER — Emergency Department (HOSPITAL_COMMUNITY): Payer: MEDICAID

## 2023-07-11 DIAGNOSIS — I959 Hypotension, unspecified: Secondary | ICD-10-CM | POA: Diagnosis present

## 2023-07-11 DIAGNOSIS — Z9102 Food additives allergy status: Secondary | ICD-10-CM

## 2023-07-11 DIAGNOSIS — F1120 Opioid dependence, uncomplicated: Secondary | ICD-10-CM | POA: Diagnosis not present

## 2023-07-11 DIAGNOSIS — Z79899 Other long term (current) drug therapy: Secondary | ICD-10-CM | POA: Diagnosis not present

## 2023-07-11 DIAGNOSIS — F17290 Nicotine dependence, other tobacco product, uncomplicated: Secondary | ICD-10-CM | POA: Diagnosis present

## 2023-07-11 DIAGNOSIS — G9340 Encephalopathy, unspecified: Secondary | ICD-10-CM | POA: Diagnosis present

## 2023-07-11 DIAGNOSIS — F1910 Other psychoactive substance abuse, uncomplicated: Secondary | ICD-10-CM | POA: Diagnosis not present

## 2023-07-11 DIAGNOSIS — Z888 Allergy status to other drugs, medicaments and biological substances status: Secondary | ICD-10-CM | POA: Diagnosis not present

## 2023-07-11 DIAGNOSIS — E785 Hyperlipidemia, unspecified: Secondary | ICD-10-CM | POA: Diagnosis present

## 2023-07-11 DIAGNOSIS — Z833 Family history of diabetes mellitus: Secondary | ICD-10-CM | POA: Diagnosis not present

## 2023-07-11 DIAGNOSIS — W19XXXA Unspecified fall, initial encounter: Secondary | ICD-10-CM | POA: Diagnosis not present

## 2023-07-11 DIAGNOSIS — Z818 Family history of other mental and behavioral disorders: Secondary | ICD-10-CM

## 2023-07-11 DIAGNOSIS — S71002A Unspecified open wound, left hip, initial encounter: Secondary | ICD-10-CM | POA: Insufficient documentation

## 2023-07-11 DIAGNOSIS — D649 Anemia, unspecified: Secondary | ICD-10-CM | POA: Diagnosis present

## 2023-07-11 DIAGNOSIS — L02416 Cutaneous abscess of left lower limb: Secondary | ICD-10-CM | POA: Insufficient documentation

## 2023-07-11 DIAGNOSIS — R410 Disorientation, unspecified: Secondary | ICD-10-CM | POA: Diagnosis present

## 2023-07-11 DIAGNOSIS — Z825 Family history of asthma and other chronic lower respiratory diseases: Secondary | ICD-10-CM | POA: Diagnosis not present

## 2023-07-11 DIAGNOSIS — F332 Major depressive disorder, recurrent severe without psychotic features: Secondary | ICD-10-CM | POA: Diagnosis present

## 2023-07-11 DIAGNOSIS — Z91041 Radiographic dye allergy status: Secondary | ICD-10-CM | POA: Diagnosis not present

## 2023-07-11 DIAGNOSIS — M79605 Pain in left leg: Secondary | ICD-10-CM | POA: Diagnosis present

## 2023-07-11 DIAGNOSIS — Z8249 Family history of ischemic heart disease and other diseases of the circulatory system: Secondary | ICD-10-CM | POA: Diagnosis not present

## 2023-07-11 DIAGNOSIS — F1990 Other psychoactive substance use, unspecified, uncomplicated: Secondary | ICD-10-CM | POA: Diagnosis present

## 2023-07-11 DIAGNOSIS — Z6841 Body Mass Index (BMI) 40.0 and over, adult: Secondary | ICD-10-CM

## 2023-07-11 DIAGNOSIS — Z7982 Long term (current) use of aspirin: Secondary | ICD-10-CM | POA: Diagnosis not present

## 2023-07-11 DIAGNOSIS — F411 Generalized anxiety disorder: Secondary | ICD-10-CM | POA: Diagnosis present

## 2023-07-11 DIAGNOSIS — S31109A Unspecified open wound of abdominal wall, unspecified quadrant without penetration into peritoneal cavity, initial encounter: Secondary | ICD-10-CM | POA: Insufficient documentation

## 2023-07-11 DIAGNOSIS — F17210 Nicotine dependence, cigarettes, uncomplicated: Secondary | ICD-10-CM | POA: Diagnosis present

## 2023-07-11 DIAGNOSIS — F17200 Nicotine dependence, unspecified, uncomplicated: Secondary | ICD-10-CM | POA: Diagnosis not present

## 2023-07-11 LAB — CBC WITH DIFFERENTIAL/PLATELET
Abs Immature Granulocytes: 0.05 10*3/uL (ref 0.00–0.07)
Basophils Absolute: 0.1 10*3/uL (ref 0.0–0.1)
Basophils Relative: 1 %
Eosinophils Absolute: 0.3 10*3/uL (ref 0.0–0.5)
Eosinophils Relative: 4 %
HCT: 27.5 % — ABNORMAL LOW (ref 36.0–46.0)
Hemoglobin: 8.9 g/dL — ABNORMAL LOW (ref 12.0–15.0)
Immature Granulocytes: 1 %
Lymphocytes Relative: 28 %
Lymphs Abs: 2 10*3/uL (ref 0.7–4.0)
MCH: 24.3 pg — ABNORMAL LOW (ref 26.0–34.0)
MCHC: 32.4 g/dL (ref 30.0–36.0)
MCV: 75.1 fL — ABNORMAL LOW (ref 80.0–100.0)
Monocytes Absolute: 0.6 10*3/uL (ref 0.1–1.0)
Monocytes Relative: 8 %
Neutro Abs: 4.1 10*3/uL (ref 1.7–7.7)
Neutrophils Relative %: 58 %
Platelets: 306 10*3/uL (ref 150–400)
RBC: 3.66 MIL/uL — ABNORMAL LOW (ref 3.87–5.11)
RDW: 20.9 % — ABNORMAL HIGH (ref 11.5–15.5)
WBC: 7 10*3/uL (ref 4.0–10.5)
nRBC: 0 % (ref 0.0–0.2)

## 2023-07-11 LAB — URINALYSIS, W/ REFLEX TO CULTURE (INFECTION SUSPECTED)
Bacteria, UA: NONE SEEN
Bilirubin Urine: NEGATIVE
Glucose, UA: NEGATIVE mg/dL
Hgb urine dipstick: NEGATIVE
Ketones, ur: NEGATIVE mg/dL
Leukocytes,Ua: NEGATIVE
Nitrite: NEGATIVE
Protein, ur: NEGATIVE mg/dL
Specific Gravity, Urine: 1.011 (ref 1.005–1.030)
pH: 5 (ref 5.0–8.0)

## 2023-07-11 LAB — COMPREHENSIVE METABOLIC PANEL WITH GFR
ALT: 38 U/L (ref 0–44)
AST: 39 U/L (ref 15–41)
Albumin: 3.5 g/dL (ref 3.5–5.0)
Alkaline Phosphatase: 152 U/L — ABNORMAL HIGH (ref 38–126)
Anion gap: 10 (ref 5–15)
BUN: 13 mg/dL (ref 6–20)
CO2: 26 mmol/L (ref 22–32)
Calcium: 9.2 mg/dL (ref 8.9–10.3)
Chloride: 99 mmol/L (ref 98–111)
Creatinine, Ser: 1.01 mg/dL — ABNORMAL HIGH (ref 0.44–1.00)
GFR, Estimated: >60 mL/min (ref >60)
Glucose, Bld: 100 mg/dL — ABNORMAL HIGH (ref 70–99)
Potassium: 3.9 mmol/L (ref 3.5–5.1)
Sodium: 135 mmol/L (ref 135–145)
Total Bilirubin: 1.8 mg/dL — ABNORMAL HIGH (ref 0.0–1.2)
Total Protein: 6.5 g/dL (ref 6.5–8.1)

## 2023-07-11 LAB — C-REACTIVE PROTEIN: CRP: 8.5 mg/dL — ABNORMAL HIGH (ref <1.0)

## 2023-07-11 LAB — RAPID URINE DRUG SCREEN, HOSP PERFORMED
Amphetamines: POSITIVE — AB
Barbiturates: NOT DETECTED
Benzodiazepines: POSITIVE — AB
Cocaine: POSITIVE — AB
Opiates: NOT DETECTED
Tetrahydrocannabinol: NOT DETECTED

## 2023-07-11 LAB — TROPONIN I (HIGH SENSITIVITY): Troponin I (High Sensitivity): <2 ng/L (ref <18)

## 2023-07-11 LAB — PROTIME-INR
INR: 1 (ref 0.8–1.2)
Prothrombin Time: 13.3 s (ref 11.4–15.2)

## 2023-07-11 LAB — LACTIC ACID, PLASMA: Lactic Acid, Venous: 1 mmol/L (ref 0.5–1.9)

## 2023-07-11 LAB — RESP PANEL BY RT-PCR (RSV, FLU A&B, COVID)  RVPGX2
Influenza A by PCR: NEGATIVE
Influenza B by PCR: NEGATIVE
Resp Syncytial Virus by PCR: NEGATIVE
SARS Coronavirus 2 by RT PCR: NEGATIVE

## 2023-07-11 LAB — ETHANOL: Alcohol, Ethyl (B): <15 mg/dL (ref <15)

## 2023-07-11 LAB — ACETAMINOPHEN LEVEL: Acetaminophen (Tylenol), Serum: <10 ug/mL — ABNORMAL LOW (ref 10–30)

## 2023-07-11 LAB — HEMOGLOBIN A1C
Hgb A1c MFr Bld: 3.8 % — ABNORMAL LOW (ref 4.8–5.6)
Mean Plasma Glucose: 62.36 mg/dL

## 2023-07-11 LAB — PREGNANCY, URINE: Preg Test, Ur: NEGATIVE

## 2023-07-11 LAB — SALICYLATE LEVEL: Salicylate Lvl: <7 mg/dL — ABNORMAL LOW (ref 7.0–30.0)

## 2023-07-11 LAB — LIPASE, BLOOD: Lipase: 25 U/L (ref 11–51)

## 2023-07-11 MED ORDER — ACETAMINOPHEN 650 MG RE SUPP: 650.0000 mg | Freq: Four times a day (QID) | RECTAL | Status: DC | PRN

## 2023-07-11 MED ORDER — FENTANYL CITRATE PF 50 MCG/ML IJ SOSY: 100.0000 ug | PREFILLED_SYRINGE | INTRAMUSCULAR | Status: DC | PRN

## 2023-07-11 MED ORDER — SODIUM CHLORIDE 0.9 % IV BOLUS
1000.0000 mL | Freq: Once | INTRAVENOUS | Status: AC
  Administered 2023-07-11: 1000 mL via INTRAVENOUS

## 2023-07-11 MED ORDER — LACTATED RINGERS IV SOLN: INTRAVENOUS | Status: DC

## 2023-07-11 MED ORDER — LEVOFLOXACIN IN D5W 500 MG/100ML IV SOLN
500.0000 mg | Freq: Once | INTRAVENOUS | Status: DC
  Filled 2023-07-11: qty 100

## 2023-07-11 MED ORDER — SODIUM CHLORIDE 0.9% FLUSH
3.0000 mL | Freq: Two times a day (BID) | INTRAVENOUS | Status: DC
  Administered 2023-07-11: 3 mL via INTRAVENOUS

## 2023-07-11 MED ORDER — ACETAMINOPHEN 325 MG PO TABS
650.0000 mg | ORAL_TABLET | Freq: Four times a day (QID) | ORAL | Status: DC | PRN
Start: 2023-07-11 — End: 2023-07-12

## 2023-07-11 MED ORDER — FENTANYL CITRATE PF 50 MCG/ML IJ SOSY
50.0000 ug | PREFILLED_SYRINGE | INTRAMUSCULAR | Status: DC | PRN
  Administered 2023-07-12: 50 ug via INTRAVENOUS
  Filled 2023-07-11 (×2): qty 1

## 2023-07-11 MED ORDER — HEPARIN SODIUM (PORCINE) 5000 UNIT/ML IJ SOLN
5000.0000 [IU] | Freq: Two times a day (BID) | INTRAMUSCULAR | Status: DC
  Filled 2023-07-11: qty 1

## 2023-07-11 NOTE — ED Notes (Signed)
 Help get patient into a gown on the monitor patient is resting with nurses at bedside did EKG shown to Dr Dolan Freiberg

## 2023-07-11 NOTE — ED Notes (Signed)
Walked patient to the bedroom patient did well patient is now back in bed with call bell in reach

## 2023-07-11 NOTE — ED Notes (Signed)
 Pt refused further lab attempts Melinda y rn aware at

## 2023-07-11 NOTE — ED Notes (Signed)
 Pt unable or unwilling to answer triage questions, questions postponed at this time.

## 2023-07-11 NOTE — ED Notes (Signed)
 Grandmother Paola Bohr [phone] would like an update asap
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
## 2023-07-11 NOTE — ED Notes (Addendum)
 Attempted to place non adherence dressing on site where wound vac dislodged ( Per Brunilda Capra MD at bedside). Pt refused dressing. Education provided to pt. Pt refused dressing screaming No and go away.

## 2023-07-11 NOTE — ED Notes (Signed)
 Phlebotomy asked to grab second set of blood cultures.

## 2023-07-11 NOTE — ED Notes (Addendum)
 Pt refused pain medication at bedside from [facility] paramedic and RN after telling provider she was in pain. Long MD aware.

## 2023-07-11 NOTE — ED Notes (Addendum)
 Pt refused to let phlebotomy stick for labs. Long MD aware via secure chat. Pt refuses to comply with equipment or keep her arm straight for lacted ringer bolus. When asked to comply by RN she responders with I wanted to go home or leave me alone.
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
## 2023-07-11 NOTE — Progress Notes (Signed)
 ORTHOPAEDIC CONSULTATION  REQUESTING PHYSICIAN: Lavanda Porter, MD  Chief Complaint: Disoriented and confused status post left hip I&D by Dr. Curtiss Dowdy  HPI: [name] is a 28 y.o. female who presented to the emergency room somnolent and confused.  She had undergone irrigation debridement wound VAC placement by Dr. Curtiss Dowdy 5 days ago.  She is scheduled for return to the OR this Monday with Dr. Curtiss Dowdy.  Unfortunately she has disconnected her wound VAC machine which is no longer attached to her hip however the sponge is still in her thigh.  She cannot give any history and is not sure what has happened to her since the surgery.  Past Medical History:  Diagnosis Date   Accidental overdose 04/13/2022   ADHD (attention deficit hyperactivity disorder)    Anesthesia complication    woke up fighting after tonsillectomy   Anxiety    Cholecystitis    Cocaine abuse (HCC)    Complication of anesthesia    when wakes up  freaks out- like panic attack   Depression    Dysmenorrhea    Endometriosis    GAD (generalized anxiety disorder) 11/16/2018   GERD (gastroesophageal reflux disease)    Headache    migraines   Hyperlipidemia    Hypoglycemia    Involuntary commitment 11/18/2022   Major depressive disorder 11/16/2018   MDD (major depressive disorder) 12/09/2012   Morbid obesity with BMI of 40.0-44.9, adult (HCC)    Opioid withdrawal (HCC) 09/12/2022   Polysubstance abuse (HCC)    Pregnancy complicated by subutex  maintenance, antepartum (HCC)    Severe benzodiazepine use disorder (HCC)    Severe opioid use disorder (HCC)    Substance induced mood disorder (HCC)    Syncope    Tobacco abuse    Viral warts    hand   Vision abnormalities    wears glasses   Past Surgical History:  Procedure Laterality Date   ADENOIDECTOMY     APPLICATION OF WOUND VAC Left 07/03/2023   Procedure: APPLICATION, WOUND VAC;  Surgeon: Laneta Pintos, MD;  Location: [facility] OR;  Service: Orthopedics;  Laterality:  Left;   APPLICATION OF WOUND VAC Left 07/06/2023   Procedure: WOUND VAC EXCHANGE;  Surgeon: Laneta Pintos, MD;  Location: [facility] OR;  Service: Orthopedics;  Laterality: Left;   BLADDER REPAIR N/A 04/20/2023   Procedure: REPAIR, BLADDER;  Surgeon: Dorrie Gaudier Alphonso Aschoff, MD;  Location: [facility] OR;  Service: General;  Laterality: N/A;   CHOLECYSTECTOMY  03/21/2011   Procedure: LAPAROSCOPIC CHOLECYSTECTOMY;  Surgeon: Rogena Class, MD;  Location: [facility] OR;  Service: General;  Laterality: N/A;   ESOPHAGOGASTRODUODENOSCOPY  09/26/2011   Procedure: ESOPHAGOGASTRODUODENOSCOPY (EGD);  Surgeon: Fortunato Ill, MD;  Location: [facility] OR;  Service: Gastroenterology;  Laterality: N/A;   INCISION AND DRAINAGE OF WOUND N/A 04/20/2023   Procedure: IRRIGATION AND DEBRIDEMENT OF PELVIS AND CLOSURE OF HIP WOUND.;  Surgeon: Dorrie Gaudier Alphonso Aschoff, MD;  Location: [facility] OR;  Service: General;  Laterality: N/A;   INCISION AND DRAINAGE OF WOUND Left 04/23/2023   Procedure: IRRIGATION AND DEBRIDEMENT AND FIXATION OF PELVIC WOUND;  Surgeon: Laneta Pintos, MD;  Location: [facility] OR;  Service: Orthopedics;  Laterality: Left;   INCISION AND DRAINAGE OF WOUND Left 06/24/2023   Procedure: IRRIGATION AND DEBRIDEMENT WOUND AND WOUND VAC PLACEMENT HIP;  Surgeon: Laneta Pintos, MD;  Location: [facility] OR;  Service: Orthopedics;  Laterality: Left;   INCISION AND DRAINAGE OF WOUND Left 06/26/2023   Procedure: IRRIGATION AND DEBRIDEMENT HIP  WITH WOUND VAC CHANGE;  Surgeon: Laneta Pintos, MD;  Location: [facility] OR;  Service: Orthopedics;  Laterality: Left;   LAPAROTOMY N/A 04/20/2023   Procedure: LAPAROTOMY, EXPLORATORY AND SPLENIC REPAIR;  Surgeon: Kinsinger, Alphonso Aschoff, MD;  Location: [facility] OR;  Service: General;  Laterality: N/A;   ORIF ANKLE FRACTURE Left 04/23/2023   Procedure: OPEN REDUCTION INTERNAL FIXATION (ORIF) ANKLE FRACTURE;  Surgeon: Laneta Pintos, MD;  Location: [facility] OR;  Service: Orthopedics;  Laterality: Left;   ORIF HUMERUS FRACTURE Right 04/23/2023    Procedure: OPEN REDUCTION INTERNAL FIXATION (ORIF) DISTAL HUMERUS FRACTURE;  Surgeon: Laneta Pintos, MD;  Location: [facility] OR;  Service: Orthopedics;  Laterality: Right;   TONSILLECTOMY AND ADENOIDECTOMY  06/2005   WISDOM TOOTH EXTRACTION     Social History   Socioeconomic History   Marital status: Single    Spouse name: Not on file   Number of children: Not on file   Years of education: Not on file   Highest education level: Not on file  Occupational History   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  Tobacco Use   Smoking status: Every Day    Current packs/day: 1.00    Average packs/day: 1 pack/day for 4.0 years (4.0 ttl pk-yrs)    Types: Cigarettes   Smokeless tobacco: Current   Tobacco comments:    0.5 PPD  Vaping Use   Vaping status: Every Day  Substance and Sexual Activity   Alcohol  use: Not Currently    Comment: socially   Drug use: Not Currently    Types: Cocaine, Fentanyl , Marijuana, Heroin, Benzodiazepines   Sexual activity: Not Currently    Birth control/protection: None  Other Topics Concern   Not on file  Social History Narrative   ** Merged History Encounter **       Repeating 9th grade (missed 40 days last year despite homebound instruction)   Social Drivers of Corporate investment banker Strain: Not on file  Food Insecurity: No Food Insecurity (06/24/2023)   Hunger Vital Sign    Worried About Running Out of Food in the Last Year: Never true    Ran Out of Food in the Last Year: Never true  Transportation Needs: No Transportation Needs (06/24/2023)   PRAPARE - Administrator, Civil Service (Medical): No    Lack of Transportation (Non-Medical): No  Physical Activity: Not on file  Stress: Not on file  Social Connections: Patient Unable To Answer (04/21/2023)   Social Connection and Isolation Panel    Frequency of Communication with Friends and Family: Patient unable to answer    Frequency of Social Gatherings with Friends and  Family: Patient unable to answer    Attends Religious Services: Patient unable to answer    Active Member of Clubs or Organizations: Patient unable to answer    Attends Banker Meetings: Patient unable to answer    Marital Status: Patient unable to answer   Family History  Problem Relation Age of Onset   Anesthesia problems Maternal Grandfather    Heart disease Maternal Grandfather    Nephrolithiasis Maternal Grandfather    Diabetes Maternal Grandfather    Mental illness Maternal Grandfather    Cholelithiasis Mother    Nephrolithiasis Mother    Depression Mother    Hypertension Mother    Miscarriages / Stillbirths Mother    Anxiety disorder Mother    Gout Father    Nephrolithiasis Maternal Grandmother    COPD Maternal  Grandmother    Heart disease Paternal Grandfather    Cholelithiasis Maternal Aunt    Depression Maternal Aunt    Learning disabilities Maternal Aunt    Bipolar disorder Sister    Allergies  Allergen Reactions   Blueberry Fruit Extract Anaphylaxis   Contrast Media [Iodinated Contrast Media] Anaphylaxis, Hives and Rash   Codeine Hives, Itching and Nausea And Vomiting   Latex Hives and Other (See Comments)    Welts, also   Omnipaque  [Iohexol ] Hives, Itching, Nausea And Vomiting and Swelling   Robaxin  [Methocarbamol ] Hives   Blueberry Flavoring Agent (Non-Screening) Rash     Positive ROS: All other systems have been reviewed and were otherwise negative with the exception of those mentioned in the HPI and as above.  Physical Exam: General: Alert, no acute distress Cardiovascular: No pedal edema Respiratory: No cyanosis, no use of accessory musculature Psychiatric: Patient is somnolent and not cooperating with motor or sensory exam  MUSCULOSKELETAL:  LLE mild serosanguineous drainage on her bed sheets.  Left thigh wound with wound VAC sponge visible.  No significant surrounding erythema, no significant swelling.  No traumatic wounds, ecchymosis,  or rash  Nontender  No knee or ankle effusion  Knee stable to varus/ valgus stress   Patient not cooperating with motor or sensory exam  DP 2+, PT 2+, No significant edema   Assessment: Principal Problem:   Fall Active Problems:   GAD (generalized anxiety disorder)   MDD (major depressive disorder), recurrent severe, without psychosis (HCC)   Severe opioid use disorder (HCC)   Tobacco use disorder  Left hip Julius Ohs lesion  Plan: Patient to be admitted to medicine for further workup of her altered mental status. concern for possible intoxication or overdose..  She is scheduled for repeat debridement of her left hip wound on Monday with Dr. Curtiss Dowdy.  Will obtain new wound VAC machine and reapply wound dressing to her left hip for now.  Currently afebrile with normal white count.  Will plan to return to OR with Dr. Curtiss Dowdy Monday.    Murleen Arms, MD  Contact information:   [id number] 7am-5pm epic message Dr. Pryor Browning, or call office for patient follow up: [phone] After hours and holidays please check [email or web address] for group call information for [facility]

## 2023-07-11 NOTE — H&P (Signed)
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 this encounter  Medications   sodium chloride  0.9 % bolus 1,000 mL   lactated ringers  infusion   levofloxacin  (LEVAQUIN ) IVPB 500 mg    Antibiotic Indication::   Cellulitis   DISCONTD: fentaNYL  (SUBLIMAZE ) injection 100 mcg   fentaNYL  (SUBLIMAZE ) injection 50 mcg   heparin injection 5,000 Units   sodium chloride  flush (NS) 0.9 % injection 3 mL   OR Linked Order Group    acetaminophen  (TYLENOL ) tablet 650 mg    acetaminophen  (TYLENOL ) suppository 650 mg     Orders Placed This Encounter  Procedures   Culture, blood (routine x 2)   Urine Culture   Resp panel by RT-PCR (RSV, Flu A&B, Covid) Anterior Nasal Swab   MRSA Next Gen by PCR, Nasal   DG Chest Portable 1 View  CT Head Wo Contrast   CT CHEST ABDOMEN PELVIS WO CONTRAST   CT Cervical Spine Wo Contrast   MR HIP LEFT WO CONTRAST   Comprehensive metabolic panel   Lipase, blood   Ethanol   CBC with Differential   Salicylate level   Acetaminophen  level   Protime-INR    Urinalysis, w/ Reflex to Culture (Infection Suspected) -Urine, Clean Catch   Urine rapid drug screen (hosp performed)   Lactic acid, plasma   Pregnancy, urine   C-reactive protein   Comprehensive metabolic panel   CBC   Hemoglobin A1c   Diet NPO time specified   ED Cardiac monitoring   Maintain IV access   Vital signs   Notify physician (specify)   Mobility Protocol: No Restrictions RN to initiate protocols based on patient's level of care   Refer to Sidebar Report Refer to ICU, Med-Surg, Progressive, and Step-Down Mobility Protocol Sidebars   Initiate Adult Central Line Maintenance and Catheter Protocol for patients with central line (CVC, PICC, Port, Hemodialysis, Trialysis)   Daily weights   Intake and Output   Do not place and if present remove PureWick   Initiate Oral Care Protocol   Initiate Carrier Fluid Protocol   RN may order General Admission PRN Orders utilizing General Admission PRN medications (through manage orders) for the following patient needs: allergy symptoms (Claritin ), cold sores (Carmex), cough (Robitussin DM), eye irritation (Liquifilm Tears), hemorrhoids (Tucks), indigestion (Maalox), minor skin irritation (Hydrocortisone  Cream), muscle pain (Ben Gay), nose irritation (saline nasal spray) and sore throat (Chloraseptic spray).   Neuro checks   Cardiac Monitoring - Continuous Indefinite   Full code   Consult to orthopedic surgery   Consult for [facility] Admission   Pulse oximetry check with vital signs   Oxygen therapy Mode or (Route): Nasal cannula; Liters Per Minute: 2; Keep O2 saturation between: greater than 92 %   Pulse oximetry, continuous   I-Stat Lactic Acid   I-Stat CG4 Lactic Acid   EKG 12-Lead   ED EKG   EKG 12-Lead   Type and screen [facility]   Saline lock IV   Admit to Inpatient (patient's expected length of stay will be greater than 2 midnights or inpatient only procedure)   Seizure precautions   VAS US  LOWER  EXTREMITY VENOUS (DVT)    Author: Lavanda Porter, MD 12 pm- 8 pm. [facility]. 07/11/2023 6:37 PM >>Please note for any concern,or critical results after hours past 8pm please contact the [facility] floor coverage provider from 7 PM- 7 AM. For on call review [email or web address], username [email or web address] and PW: your phone number<<

## 2023-07-12 ENCOUNTER — Encounter (HOSPITAL_COMMUNITY): Payer: Self-pay | Admitting: Internal Medicine

## 2023-07-12 ENCOUNTER — Other Ambulatory Visit: Payer: Self-pay

## 2023-07-12 ENCOUNTER — Encounter (HOSPITAL_COMMUNITY): Payer: MEDICAID

## 2023-07-12 DIAGNOSIS — S71002A Unspecified open wound, left hip, initial encounter: Secondary | ICD-10-CM | POA: Insufficient documentation

## 2023-07-12 DIAGNOSIS — S31109A Unspecified open wound of abdominal wall, unspecified quadrant without penetration into peritoneal cavity, initial encounter: Secondary | ICD-10-CM | POA: Insufficient documentation

## 2023-07-12 DIAGNOSIS — W19XXXA Unspecified fall, initial encounter: Secondary | ICD-10-CM | POA: Diagnosis not present

## 2023-07-12 DIAGNOSIS — F1910 Other psychoactive substance abuse, uncomplicated: Secondary | ICD-10-CM

## 2023-07-12 DIAGNOSIS — F1120 Opioid dependence, uncomplicated: Secondary | ICD-10-CM | POA: Diagnosis not present

## 2023-07-12 DIAGNOSIS — F332 Major depressive disorder, recurrent severe without psychotic features: Secondary | ICD-10-CM

## 2023-07-12 DIAGNOSIS — F411 Generalized anxiety disorder: Secondary | ICD-10-CM

## 2023-07-12 DIAGNOSIS — F17200 Nicotine dependence, unspecified, uncomplicated: Secondary | ICD-10-CM

## 2023-07-12 DIAGNOSIS — L02416 Cutaneous abscess of left lower limb: Secondary | ICD-10-CM | POA: Insufficient documentation

## 2023-07-12 LAB — URINE CULTURE: Culture: <10000 — AB

## 2023-07-12 LAB — MRSA NEXT GEN BY PCR, NASAL: MRSA by PCR Next Gen: NOT DETECTED

## 2023-07-12 LAB — TROPONIN I (HIGH SENSITIVITY): Troponin I (High Sensitivity): <2 ng/L (ref <18)

## 2023-07-12 MED ORDER — LEVOFLOXACIN IN D5W 750 MG/150ML IV SOLN
750.0000 mg | Freq: Every day | INTRAVENOUS | Status: DC
  Filled 2023-07-12: qty 150
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
## 2023-07-12 NOTE — Plan of Care (Signed)
  Problem: Pain Managment: Goal: General experience of comfort will improve and/or be controlled Outcome: Progressing   Problem: Safety: Goal: Ability to remain free from injury will improve Outcome: Progressing

## 2023-07-12 NOTE — Progress Notes (Signed)
 Patient is uncooperative and confused.but follows command.She removed her IV line and Tele monitor.

## 2023-07-12 NOTE — Progress Notes (Signed)
 Pt is refusing IV fluids and antibiotics. Pt stated over and over she does not want a IV. DR. Mae Schlossman notified via secure chat, this nurse had pt to sign AMA form and place it in the chart. Charge RN notified and took pt IV out and discontinue the tele monitor.

## 2023-07-13 ENCOUNTER — Encounter (HOSPITAL_COMMUNITY): Admission: RE | Payer: Self-pay | Source: Home / Self Care

## 2023-07-13 ENCOUNTER — Ambulatory Visit (HOSPITAL_COMMUNITY): Admission: RE | Admit: 2023-07-13 | Payer: MEDICAID | Source: Home / Self Care | Admitting: Student

## 2023-07-13 SURGERY — APPLICATION, WOUND VAC
Anesthesia: General | Laterality: Left

## 2023-07-13 MED ORDER — LACTATED RINGERS IV SOLN: INTRAVENOUS | Status: AC

## 2023-07-13 MED ORDER — CHLORHEXIDINE GLUCONATE 0.12 % MT SOLN: 15.0000 mL | OROMUCOSAL | Status: AC

## 2023-07-14 LAB — BPAM RBC
Blood Product Expiration Date: 202507202359
Blood Product Expiration Date: 202507202359
Unit Type and Rh: 5100
Unit Type and Rh: 5100

## 2023-07-14 LAB — TYPE AND SCREEN
ABO/RH(D): A POS
Antibody Screen: POSITIVE
Unit division: 0
Unit division: 0

## 2023-07-16 LAB — CULTURE, BLOOD (ROUTINE X 2)
Culture: NO GROWTH
Culture: NO GROWTH
Special Requests: ADEQUATE

## 2023-08-04 ENCOUNTER — Ambulatory Visit: Payer: Self-pay | Admitting: Student

## 2023-08-05 ENCOUNTER — Encounter (HOSPITAL_COMMUNITY): Payer: Self-pay | Admitting: Student

## 2023-08-05 ENCOUNTER — Other Ambulatory Visit: Payer: Self-pay

## 2023-08-05 NOTE — Progress Notes (Signed)
 SDW CALL  Patient was given pre-op instructions over the phone. The opportunity was given for the patient to ask questions. No further questions asked. Patient verbalized understanding of instructions given.   PCP - denies Cardiologist -denies   PPM/ICD - denies   Chest x-ray - 07/11/23 EKG - 07/13/23 Stress Test -  ECHO -  Cardiac Cath -   Sleep Study - denies  No DM  Last dose of GLP1 agonist-  n/a GLP1 instructions:  n/a  Blood Thinner Instructions:  n/a Aspirin  Instructions:   n/a  ERAS Protcol - NPO   COVID TEST-  n/a   Anesthesia review: yes - difficult airway  Patient denies shortness of breath, fever, cough and chest pain over the phone call   All instructions explained to the patient, with a verbal understanding of the material. Patient agrees to go over the instructions while at home for a better understanding.

## 2023-08-06 ENCOUNTER — Encounter (HOSPITAL_COMMUNITY): Payer: Self-pay | Admitting: Physician Assistant
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
## 2023-08-07 ENCOUNTER — Ambulatory Visit (HOSPITAL_COMMUNITY): Admission: RE | Admit: 2023-08-07 | Payer: MEDICAID | Source: Home / Self Care | Admitting: Student

## 2023-08-07 SURGERY — APPLICATION, WOUND VAC
Anesthesia: General | Laterality: Left

## 2023-08-19 ENCOUNTER — Ambulatory Visit: Payer: Self-pay | Admitting: Student

## 2023-08-24 ENCOUNTER — Encounter (HOSPITAL_COMMUNITY): Payer: Self-pay | Admitting: Student

## 2023-08-24 ENCOUNTER — Other Ambulatory Visit: Payer: Self-pay

## 2023-08-24 NOTE — Progress Notes (Signed)
 SDW CALL  Patient was given pre-op instructions over the phone. The opportunity was given for the patient to ask questions. No further questions asked. Patient verbalized understanding of instructions given.  DATE & ARRIVAL TIME- JULY 30, 7:30 Am.  PCP - NO PCP Cardiologist -   PPM/ICD - denies Device Orders - n/a Rep Notified - n/a  Chest x-ray - 07-11-23 EKG - 07-11-23 Stress Test - denies ECHO - denies Cardiac Cath - denies  Sleep Study - denies CPAP - n/a  DM -denies  Blood Thinner Instructions:denies Aspirin  Instructions:denies  ERAS Protcol -NPO   COVID TEST- n/a   Anesthesia review: Yes,  hx of difficulty airway  Patient denies shortness of breath, fever, cough and chest pain over the phone call   All instructions explained to the patient, with a verbal understanding of the material. Patient agrees to go over the instructions while at home for a better understanding.
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
## 2023-08-26 ENCOUNTER — Other Ambulatory Visit: Payer: Self-pay

## 2023-08-26 ENCOUNTER — Ambulatory Visit (HOSPITAL_COMMUNITY): Payer: MEDICAID | Admitting: Vascular Surgery

## 2023-08-26 ENCOUNTER — Ambulatory Visit (HOSPITAL_BASED_OUTPATIENT_CLINIC_OR_DEPARTMENT_OTHER): Payer: MEDICAID | Admitting: Vascular Surgery

## 2023-08-26 ENCOUNTER — Encounter (HOSPITAL_COMMUNITY)
Admission: RE | Disposition: A | Discharge status: Home / Self Care | Payer: Self-pay | Source: Home / Self Care | Attending: Student

## 2023-08-26 ENCOUNTER — Observation Stay (HOSPITAL_COMMUNITY)
Admission: RE | Admit: 2023-08-26 | Discharge: 2023-08-27 | Disposition: A | Discharge status: Home / Self Care | Payer: MEDICAID | Attending: Student | Admitting: Student

## 2023-08-26 DIAGNOSIS — X58XXXA Exposure to other specified factors, initial encounter: Secondary | ICD-10-CM | POA: Diagnosis not present

## 2023-08-26 DIAGNOSIS — Z91040 Latex allergy status: Secondary | ICD-10-CM | POA: Diagnosis not present

## 2023-08-26 DIAGNOSIS — F17220 Nicotine dependence, chewing tobacco, uncomplicated: Secondary | ICD-10-CM | POA: Insufficient documentation

## 2023-08-26 DIAGNOSIS — Z91199 Patient's noncompliance with other medical treatment and regimen due to unspecified reason: Secondary | ICD-10-CM

## 2023-08-26 DIAGNOSIS — E785 Hyperlipidemia, unspecified: Secondary | ICD-10-CM | POA: Diagnosis not present

## 2023-08-26 DIAGNOSIS — S81802A Unspecified open wound, left lower leg, initial encounter: Principal | ICD-10-CM | POA: Diagnosis present

## 2023-08-26 DIAGNOSIS — Y849 Medical procedure, unspecified as the cause of abnormal reaction of the patient, or of later complication, without mention of misadventure at the time of the procedure: Secondary | ICD-10-CM | POA: Insufficient documentation

## 2023-08-26 DIAGNOSIS — F418 Other specified anxiety disorders: Secondary | ICD-10-CM | POA: Diagnosis not present

## 2023-08-26 DIAGNOSIS — S71002D Unspecified open wound, left hip, subsequent encounter: Secondary | ICD-10-CM

## 2023-08-26 DIAGNOSIS — T8452XA Infection and inflammatory reaction due to internal left hip prosthesis, initial encounter: Secondary | ICD-10-CM | POA: Diagnosis present

## 2023-08-26 HISTORY — DX: Anemia, unspecified: D64.9

## 2023-08-26 HISTORY — PX: APPLICATION OF WOUND VAC: SHX5189

## 2023-08-26 LAB — CBC
HCT: 36.4 % (ref 36.0–46.0)
Hemoglobin: 11 g/dL — ABNORMAL LOW (ref 12.0–15.0)
MCH: 25.8 pg — ABNORMAL LOW (ref 26.0–34.0)
MCHC: 30.2 g/dL (ref 30.0–36.0)
MCV: 85.2 fL (ref 80.0–100.0)
Platelets: 239 K/uL (ref 150–400)
RBC: 4.27 MIL/uL (ref 3.87–5.11)
RDW: 15 % (ref 11.5–15.5)
WBC: 4.2 K/uL (ref 4.0–10.5)
nRBC: 0 % (ref 0.0–0.2)

## 2023-08-26 LAB — POCT PREGNANCY, URINE: Preg Test, Ur: NEGATIVE

## 2023-08-26 LAB — COMPREHENSIVE METABOLIC PANEL WITH GFR
ALT: 47 U/L — ABNORMAL HIGH (ref 0–44)
AST: 47 U/L — ABNORMAL HIGH (ref 15–41)
Albumin: 3.2 g/dL — ABNORMAL LOW (ref 3.5–5.0)
Alkaline Phosphatase: 134 U/L — ABNORMAL HIGH (ref 38–126)
Anion gap: 11 (ref 5–15)
BUN: 10 mg/dL (ref 6–20)
CO2: 22 mmol/L (ref 22–32)
Calcium: 8.9 mg/dL (ref 8.9–10.3)
Chloride: 103 mmol/L (ref 98–111)
Creatinine, Ser: 0.74 mg/dL (ref 0.44–1.00)
GFR, Estimated: >60 mL/min (ref >60)
Glucose, Bld: 90 mg/dL (ref 70–99)
Potassium: 4.2 mmol/L (ref 3.5–5.1)
Sodium: 136 mmol/L (ref 135–145)
Total Bilirubin: 0.5 mg/dL (ref 0.0–1.2)
Total Protein: 5.6 g/dL — ABNORMAL LOW (ref 6.5–8.1)

## 2023-08-26 SURGERY — APPLICATION, WOUND VAC
Anesthesia: General | Site: Hip | Laterality: Left

## 2023-08-26 MED ORDER — ONDANSETRON HCL 4 MG/2ML IJ SOLN
INTRAMUSCULAR | Status: DC | PRN
  Administered 2023-08-26: 4 mg via INTRAVENOUS

## 2023-08-26 MED ORDER — MIDAZOLAM HCL 2 MG/2ML IJ SOLN
INTRAMUSCULAR | Status: DC | PRN
  Administered 2023-08-26: 2 mg via INTRAVENOUS

## 2023-08-26 MED ORDER — ACETAMINOPHEN 500 MG PO TABS
1000.0000 mg | ORAL_TABLET | Freq: Once | ORAL | Status: AC
  Administered 2023-08-26: 1000 mg via ORAL
  Filled 2023-08-26: qty 2

## 2023-08-26 MED ORDER — MIDAZOLAM HCL 2 MG/2ML IJ SOLN
INTRAMUSCULAR | Status: AC
  Filled 2023-08-26: qty 2

## 2023-08-26 MED ORDER — PROPOFOL 10 MG/ML IV BOLUS
INTRAVENOUS | Status: DC | PRN
  Administered 2023-08-26: 250 mg via INTRAVENOUS
  Administered 2023-08-26: 50 mg via INTRAVENOUS

## 2023-08-26 MED ORDER — METOCLOPRAMIDE HCL 5 MG PO TABS: 5.0000 mg | ORAL_TABLET | Freq: Three times a day (TID) | ORAL | Status: DC | PRN

## 2023-08-26 MED ORDER — CYCLOBENZAPRINE HCL 10 MG PO TABS: 10.0000 mg | ORAL_TABLET | Freq: Three times a day (TID) | ORAL | Status: DC | PRN

## 2023-08-26 MED ORDER — CHLORHEXIDINE GLUCONATE 0.12 % MT SOLN
15.0000 mL | Freq: Once | OROMUCOSAL | Status: AC
  Administered 2023-08-26: 15 mL via OROMUCOSAL
  Filled 2023-08-26: qty 15

## 2023-08-26 MED ORDER — DEXMEDETOMIDINE HCL IN NACL 80 MCG/20ML IV SOLN
INTRAVENOUS | Status: DC | PRN
  Administered 2023-08-26 (×2): 8 ug via INTRAVENOUS
  Administered 2023-08-26: 4 ug via INTRAVENOUS

## 2023-08-26 MED ORDER — SODIUM FLUORIDE 1.1 % DT CREA: 1.0000 | TOPICAL_CREAM | Freq: Every day | DENTAL | Status: DC

## 2023-08-26 MED ORDER — DIPHENHYDRAMINE HCL 12.5 MG/5ML PO ELIX: 12.5000 mg | ORAL_SOLUTION | ORAL | Status: DC | PRN

## 2023-08-26 MED ORDER — NALOXONE HCL 4 MG/0.1ML NA LIQD: 1.0000 | Freq: Every day | NASAL | Status: DC | PRN

## 2023-08-26 MED ORDER — HYDROGEN PEROXIDE 3 % EX SOLN
CUTANEOUS | Status: DC | PRN
Start: 2023-08-26 — End: 2023-08-26
  Administered 2023-08-26: 1 via TOPICAL

## 2023-08-26 MED ORDER — VANCOMYCIN HCL 1000 MG IV SOLR
INTRAVENOUS | Status: DC | PRN
  Administered 2023-08-26: 1000 mg via TOPICAL

## 2023-08-26 MED ORDER — FENTANYL CITRATE (PF) 250 MCG/5ML IJ SOLN
INTRAMUSCULAR | Status: AC
  Filled 2023-08-26: qty 5

## 2023-08-26 MED ORDER — 0.9 % SODIUM CHLORIDE (POUR BTL) OPTIME
TOPICAL | Status: DC | PRN
  Administered 2023-08-26: 1000 mL

## 2023-08-26 MED ORDER — ONDANSETRON HCL 4 MG PO TABS: 4.0000 mg | ORAL_TABLET | Freq: Four times a day (QID) | ORAL | Status: DC | PRN

## 2023-08-26 MED ORDER — PROPOFOL 10 MG/ML IV BOLUS
INTRAVENOUS | Status: AC
  Filled 2023-08-26: qty 20

## 2023-08-26 MED ORDER — DEXAMETHASONE SODIUM PHOSPHATE 10 MG/ML IJ SOLN
INTRAMUSCULAR | Status: DC | PRN
Start: 2023-08-26 — End: 2023-08-26
  Administered 2023-08-26: 5 mg via INTRAVENOUS

## 2023-08-26 MED ORDER — METOCLOPRAMIDE HCL 5 MG/ML IJ SOLN: 5.0000 mg | Freq: Three times a day (TID) | INTRAMUSCULAR | Status: DC | PRN

## 2023-08-26 MED ORDER — FENTANYL CITRATE (PF) 250 MCG/5ML IJ SOLN
INTRAMUSCULAR | Status: DC | PRN
  Administered 2023-08-26 (×3): 50 ug via INTRAVENOUS

## 2023-08-26 MED ORDER — ACETAMINOPHEN 500 MG PO TABS: 1000.0000 mg | ORAL_TABLET | Freq: Four times a day (QID) | ORAL | Status: DC | PRN

## 2023-08-26 MED ORDER — POLYETHYLENE GLYCOL 3350 17 G PO PACK: 17.0000 g | PACK | Freq: Every day | ORAL | Status: DC | PRN

## 2023-08-26 MED ORDER — ONDANSETRON 4 MG PO TBDP: 4.0000 mg | ORAL_TABLET | Freq: Three times a day (TID) | ORAL | Status: DC | PRN

## 2023-08-26 MED ORDER — CEFAZOLIN SODIUM-DEXTROSE 2-4 GM/100ML-% IV SOLN
2.0000 g | INTRAVENOUS | Status: AC
  Administered 2023-08-26: 2 g via INTRAVENOUS
  Filled 2023-08-26: qty 100

## 2023-08-26 MED ORDER — SODIUM CHLORIDE 0.9 % IR SOLN
Status: DC | PRN
  Administered 2023-08-26: 3000 mL

## 2023-08-26 MED ORDER — ONDANSETRON HCL 4 MG/2ML IJ SOLN: 4.0000 mg | Freq: Four times a day (QID) | INTRAMUSCULAR | Status: DC | PRN

## 2023-08-26 MED ORDER — VANCOMYCIN HCL 1000 MG IV SOLR
INTRAVENOUS | Status: AC
  Filled 2023-08-26: qty 20

## 2023-08-26 MED ORDER — ORAL CARE MOUTH RINSE: 15.0000 mL | Freq: Once | OROMUCOSAL | Status: AC

## 2023-08-26 MED ORDER — HYDROXYZINE HCL 25 MG PO TABS: 25.0000 mg | ORAL_TABLET | Freq: Three times a day (TID) | ORAL | Status: DC | PRN

## 2023-08-26 MED ORDER — LIDOCAINE HCL (CARDIAC) PF 100 MG/5ML IV SOSY
PREFILLED_SYRINGE | INTRAVENOUS | Status: DC | PRN
  Administered 2023-08-26: 100 mg via INTRAVENOUS

## 2023-08-26 MED ORDER — QUETIAPINE FUMARATE 100 MG PO TABS
200.0000 mg | ORAL_TABLET | Freq: Every day | ORAL | Status: DC
  Administered 2023-08-26: 200 mg via ORAL
  Filled 2023-08-26: qty 2

## 2023-08-26 MED ORDER — POLYETHYLENE GLYCOL 3350 17 GM/SCOOP PO POWD: 17.0000 g | Freq: Every day | ORAL | Status: DC | PRN

## 2023-08-26 MED ORDER — LACTATED RINGERS IV SOLN: INTRAVENOUS | Status: DC

## 2023-08-26 MED ORDER — BUPRENORPHINE HCL 8 MG SL SUBL
8.0000 mg | SUBLINGUAL_TABLET | Freq: Two times a day (BID) | SUBLINGUAL | Status: DC
  Administered 2023-08-26 – 2023-08-27 (×2): 8 mg via SUBLINGUAL
  Filled 2023-08-26 (×2): qty 1

## 2023-08-26 SURGICAL SUPPLY — 31 items
BAG COUNTER SPONGE SURGICOUNT (BAG) ×1 IMPLANT
BNDG ELASTIC 4X5.8 VLCR STR LF (GAUZE/BANDAGES/DRESSINGS) IMPLANT
BNDG GAUZE DERMACEA FLUFF 4 (GAUZE/BANDAGES/DRESSINGS) IMPLANT
CANISTER SUCTION 3000ML PPV (SUCTIONS) ×1 IMPLANT
CANISTER WOUND CARE 500ML ATS (WOUND CARE) ×1 IMPLANT
COVER SURGICAL LIGHT HANDLE (MISCELLANEOUS) ×1 IMPLANT
DRAPE HALF SHEET 40X57 (DRAPES) IMPLANT
DRAPE INCISE IOBAN 66X45 STRL (DRAPES) IMPLANT
DRAPE SURG ORHT 6 SPLT 77X108 (DRAPES) IMPLANT
DRESSING VERAFLO CLEANS CC MED (GAUZE/BANDAGES/DRESSINGS) IMPLANT
DRSG VAC GRANUFOAM LG (GAUZE/BANDAGES/DRESSINGS) IMPLANT
DRSG VAC GRANUFOAM MED (GAUZE/BANDAGES/DRESSINGS) IMPLANT
DRSG VAC GRANUFOAM SM (GAUZE/BANDAGES/DRESSINGS) IMPLANT
ELECTRODE REM PT RTRN 9FT ADLT (ELECTROSURGICAL) ×1 IMPLANT
GAUZE PAD ABD 8X10 STRL (GAUZE/BANDAGES/DRESSINGS) IMPLANT
GAUZE SPONGE 4X4 12PLY STRL (GAUZE/BANDAGES/DRESSINGS) IMPLANT
GAUZE XEROFORM 5X9 LF (GAUZE/BANDAGES/DRESSINGS) ×1 IMPLANT
GLOVE BIO SURGEON STRL SZ 6.5 (GLOVE) ×3 IMPLANT
GLOVE BIO SURGEON STRL SZ7.5 (GLOVE) ×4 IMPLANT
GLOVE BIOGEL PI IND STRL 6.5 (GLOVE) ×1 IMPLANT
GLOVE BIOGEL PI IND STRL 7.5 (GLOVE) ×1 IMPLANT
GOWN STRL REUS W/ TWL LRG LVL3 (GOWN DISPOSABLE) ×2 IMPLANT
KIT BASIN OR (CUSTOM PROCEDURE TRAY) ×1 IMPLANT
KIT TURNOVER KIT B (KITS) ×1 IMPLANT
NS IRRIG 1000ML POUR BTL (IV SOLUTION) ×1 IMPLANT
PACK ORTHO EXTREMITY (CUSTOM PROCEDURE TRAY) ×1 IMPLANT
PAD ARMBOARD POSITIONER FOAM (MISCELLANEOUS) ×1 IMPLANT
TOWEL GREEN STERILE (TOWEL DISPOSABLE) ×1 IMPLANT
TOWEL GREEN STERILE FF (TOWEL DISPOSABLE) ×1 IMPLANT
TUBE CONNECTING 12X1/4 (SUCTIONS) ×1 IMPLANT
YANKAUER SUCT BULB TIP NO VENT (SUCTIONS) ×1 IMPLANT

## 2023-08-26 NOTE — Anesthesia Postprocedure Evaluation (Signed)
 Anesthesia Post Note  Patient: [name]  Procedure(s) Performed: APPLICATION, WOUND VAC HIP (Left: Hip)     Patient location during evaluation: PACU Anesthesia Type: General Level of consciousness: awake and alert Pain management: pain level controlled Vital Signs Assessment: post-procedure vital signs reviewed and stable Respiratory status: spontaneous breathing, nonlabored ventilation, respiratory function stable and patient connected to nasal cannula oxygen Cardiovascular status: blood pressure returned to baseline and stable Postop Assessment: no apparent nausea or vomiting Anesthetic complications: no   No notable events documented.  Last Vitals:  Vitals:   08/26/23 1230 08/26/23 1245  BP: 111/69 112/63  Pulse: 90 86  Resp: 13 (!) 9  Temp:    SpO2: 99% 99%    Last Pain:  Vitals:   08/26/23 1245  TempSrc:   PainSc: Asleep                 Thom JONELLE Peoples

## 2023-08-26 NOTE — Care Management (Signed)
 ED RNCM spoke with [employer] rep. [name] Mangan [phone] , who attempting to obtain insurance authorization.  Unable to provide an ETA, but did state because referral was placed lat in the day authourization may not be back until in the am.  ED RNCM updated  PACU RN Edsel.    Albert Gosling RN, BSN CNOR ED RN Care Manager [phone]

## 2023-08-26 NOTE — Interval H&P Note (Signed)
 History and Physical Interval Note:  08/26/2023 10:33 AM  [name] C[place]  has presented today for surgery, with the diagnosis of Left hip wound.  The various methods of treatment have been discussed with the patient and family. After consideration of risks, benefits and other options for treatment, the patient has consented to  Procedure(s): APPLICATION, WOUND VAC HIP (Left) as a surgical intervention.  The patient's history has been reviewed, patient examined, no change in status, stable for surgery.  I have reviewed the patient's chart and labs.  Questions were answered to the patient's satisfaction.     Grayce Budden P Devyon Keator

## 2023-08-26 NOTE — Op Note (Signed)
 Orthopaedic Surgery Operative Note (CSN: [id number] ) Date of Surgery: 08/26/2023  Admit Date: 08/26/2023   Diagnoses: Pre-Op Diagnoses: Left hip wound Patient noncompliance  Post-Op Diagnosis: Same  Procedures: CPT 27087-Removal of foreign body/VAC sponge left hip CPT 26990-I&D of left hip/pelvis region CPT 97605-Wound vac placement to left hip  Surgeons : Primary: Kendal Franky SQUIBB, MD  Assistant: [name], PA-C  Location: OR 3   Anesthesia: General   Antibiotics: Ancef  2g preop with 1 gm vancomycin  powder placed topically   Tourniquet time: None    Estimated Blood Loss: 30 mL  Complications:* No complications entered in OR log *   Specimens:* No specimens in log *   Implants: * No implants in log *   Indications for Surgery: 28 year old female who had a degloving injury to her left hip.  She was taken for I&D and subsequently continued to drain.  We were doing weekly wound VAC changes after another debridement.  Unfortunately she was lost to follow-up and I had her on the schedule a few weeks ago for wound VAC change but she was not able to show for that appointment.  She presents today for wound VAC change and redebridement of the left hip.  Risks and benefits were discussed with the patient and her mother.  They agreed to proceed with surgery.  Operative Findings: 1.  The previous wound VAC sponge had ingrown into the soft tissues requiring debridement and removal of the sponge.  This was done with Cobb elevator, ronguer, and knife. 2.  Irrigation using approximately 1500 mL of normal saline. 3.  Wound VAC placement to left hip.  Postdebridement size was 3 cm x 2.5 cm at the entry with undermining of 6.5 cm anteriorly and 7 cm posteriorly.  Procedure: The patient was identified in the preoperative holding area. Consent was confirmed with the patient and their family and all questions were answered. The operative extremity was marked after confirmation with the  patient. she was then brought back to the operating room by our anesthesia colleagues.  She was carefully transferred over to radiolucent flattop table.  She was placed under general anesthetic.  The left hip was then prepped and draped in usual sterile fashion.  A timeout was performed to verify the patient, the procedure, and the extremity.  Preoperative antibiotics were dosed.  For started out by trying to remove the sponge.  Unfortunately the soft tissue and ingrown into the sponge causing it to adhere significantly.  This caused very much difficult time removing the sponge.  I had to use a Cobb elevator as well as hydrogen peroxide  to loosen the sponge enough to remove it in a piecemeal fashion.  I was able to get the majority of the sponge out and then I used knife, Cobb elevator and ronguer to debride the soft tissue edges as well as the periphery of the wound with retained sponge pieces.  I was able to get the majority of the sponge pieces out.  I then used low-pressure pulsatile lavage to thoroughly irrigate the wound with approximately 1.5 L of normal saline.  A gram vancomycin  powder was placed into the wound.  A blue granular foam sponge was then placed into the wound.  The size of the wound as noted above was 2.5 x 3 cm with a depth of 7 cm with undermining of 6.5 cm anteriorly and 7 cm posteriorly.  I connected the wound VAC sponge to 125 mmHg.  The patient was then awoke from anesthesia  and taken to the PACU in stable condition.   Debridement type: Excisional Debridement  Side: left  Body Location: hip   Tools used for debridement: scalpel and rongeur and cobb elevator  Pre-debridement Wound size (cm):   Length: 3        Width: 2.5     Depth: 7 with 6.5cm of anterior undermining and 7 cm of posterior undermining  Post-debridement Wound size (cm):   Same  Debridement depth beyond dead/damaged tissue down to healthy viable tissue: yes  Tissue layer involved: skin, subcutaneous tissue,  muscle / fascia  Nature of tissue removed: Slough and Devitalized Tissue  Irrigation volume: 1.5L     Irrigation fluid type: Normal Saline   Post Op Plan/Instructions: The patient will be weightbearing as tolerated to the left lower extremity.  She will continue with the wound VAC.  We will plan to change her wound VAC likely sometime next week depending on OR availability.  She will discharge home from the PACU as long as we can get her wound VAC set up.  I was present and performed the entire surgery.  [name], PA-C did assist me throughout the case. An assistant was necessary given the difficulty in approach, maintenance of reduction and ability to instrument the fracture.   Franky Light, MD [facility]

## 2023-08-26 NOTE — Discharge Instructions (Signed)
 Orthopaedic Trauma Service Discharge Instructions   General Discharge Instructions  WEIGHT BEARING STATUS:Weightbearing as tolerated  RANGE OF MOTION/ACTIVITY: Motion as tolerated  Wound Care: Leave wound vac in place until follow-up.   DVT/PE prophylaxis: None  Diet: as you were eating previously.  Can use over the counter stool softeners and bowel preparations, such as Miralax , to help with bowel movements.  Narcotics can be constipating.  Be sure to drink plenty of fluids  PAIN MEDICATION USE AND EXPECTATIONS  You have likely been given narcotic medications to help control your pain.  After a traumatic event that results in an fracture (broken bone) with or without surgery, it is ok to use narcotic pain medications to help control one's pain.  We understand that everyone responds to pain differently and each individual patient will be evaluated on a regular basis for the continued need for narcotic medications. Ideally, narcotic medication use should last no more than 6-8 weeks (coinciding with fracture healing).   As a patient it is your responsibility as well to monitor narcotic medication use and report the amount and frequency you use these medications when you come to your office visit.   We would also advise that if you are using narcotic medications, you should take a dose prior to therapy to maximize you participation.  IF YOU ARE ON NARCOTIC MEDICATIONS IT IS NOT PERMISSIBLE TO OPERATE A MOTOR VEHICLE (MOTORCYCLE/CAR/TRUCK/MOPED) OR HEAVY MACHINERY DO NOT MIX NARCOTICS WITH OTHER CNS (CENTRAL NERVOUS SYSTEM) DEPRESSANTS SUCH AS ALCOHOL    STOP SMOKING OR USING NICOTINE  PRODUCTS!!!!  As discussed nicotine  severely impairs your body's ability to heal surgical and traumatic wounds but also impairs bone healing.  Wounds and bone heal by forming microscopic blood vessels (angiogenesis) and nicotine  is a vasoconstrictor (essentially, shrinks blood vessels).  Therefore, if  vasoconstriction occurs to these microscopic blood vessels they essentially disappear and are unable to deliver necessary nutrients to the healing tissue.  This is one modifiable factor that you can do to dramatically increase your chances of healing your injury.  (This means no smoking, no nicotine  gum, patches, etc)  ICE AND ELEVATE INJURED/OPERATIVE EXTREMITY  Using ice and elevating the injured extremity above your heart can help with swelling and pain control.  Icing in a pulsatile fashion, such as 20 minutes on and 20 minutes off, can be followed.    Do not place ice directly on skin. Make sure there is a barrier between to skin and the ice pack.    Using frozen items such as frozen peas works well as the conform nicely to the are that needs to be iced.  USE AN ACE WRAP OR TED HOSE FOR SWELLING CONTROL  In addition to icing and elevation, Ace wraps or TED hose are used to help limit and resolve swelling.  It is recommended to use Ace wraps or TED hose until you are informed to stop.    When using Ace Wraps start the wrapping distally (farthest away from the body) and wrap proximally (closer to the body)   Example: If you had surgery on your leg or thing and you do not have a splint on, start the ace wrap at the toes and work your way up to the thigh        If you had surgery on your upper extremity and do not have a splint on, start the ace wrap at your fingers and work your way up to the upper arm  CALL THE OFFICE FOR  MEDICATION REFILLS OR WITH ANY QUESTIONS/CONCERNS: [phone]   VISIT OUR WEBSITE FOR ADDITIONAL INFORMATION: [email or web address]  OTHER HELPFUL INFORMATION  Do not drink alcoholic beverages or take illicit drugs when taking pain medications.  In most states it is against the law to drive while you are in a splint or sling.  And certainly against the law to drive while taking narcotics.  You may return to work/school in the next couple of days when you feel up to it.    Pain medication may make you constipated.  Below are a few solutions to try in this order: Decrease the amount of pain medication if you aren't having pain. Drink lots of decaffeinated fluids. Drink prune juice and/or each dried prunes  If the first 3 don't work start with additional solutions Take Colace - an over-the-counter stool softener Take Senokot - an over-the-counter laxative Take Miralax  - a stronger over-the-counter laxative

## 2023-08-26 NOTE — Plan of Care (Signed)

## 2023-08-26 NOTE — Discharge Planning (Signed)
 Sam Devonshire, BSN, RN, [facility] [phone] Pt qualifies for DME [facility] Medical Equipment) Negative pressure wound device.  DME  ordered through [facility].  [name] Mangan [phone]) notified to deliver DME to pt room prior to D/C home.  RNCM faxed the following documents to [facility] as requested: Face sheet History and physical Operative note Wound measurements

## 2023-08-26 NOTE — Discharge Planning (Signed)
 [name] with [employer] confirmed receipt of documents to being authorization for DME wound vac.  Lavanda will fax orders to [name], PA for e-signature.  Report handed off to evening shift RNCM.  Lanny Donoso J. Debarah, BSN, RN, [facility]  IP Care Management  Nurse Case Manager  [facility]  Operative Services  [phone]

## 2023-08-26 NOTE — Anesthesia Procedure Notes (Signed)
 Procedure Name: LMA Insertion Date/Time: 08/26/2023 11:16 AM  Performed by: Cindie Donald CROME, CRNAPre-anesthesia Checklist: Patient identified, Emergency Drugs available, Suction available and Patient being monitored Patient Re-evaluated:Patient Re-evaluated prior to induction Oxygen Delivery Method: Circle System Utilized Preoxygenation: Pre-oxygenation with 100% oxygen Induction Type: IV induction Ventilation: Mask ventilation without difficulty LMA: LMA inserted LMA Size: 4.0 Number of attempts: 1 Airway Equipment and Method: Bite block Placement Confirmation: positive ETCO2 Tube secured with: Tape Dental Injury: Teeth and Oropharynx as per pre-operative assessment

## 2023-08-26 NOTE — Transfer of Care (Signed)
 Immediate Anesthesia Transfer of Care Note  Patient: [name]  Procedure(s) Performed: APPLICATION, WOUND VAC HIP (Left: Hip)  Patient Location: PACU  Anesthesia Type:General  Level of Consciousness: drowsy  Airway & Oxygen Therapy: Patient Spontanous Breathing and Patient connected to face mask oxygen  Post-op Assessment: Report given to RN and Post -op Vital signs reviewed and stable  Post vital signs: Reviewed and stable  Last Vitals:  Vitals Value Taken Time  BP 106/49 08/26/23 12:12  Temp    Pulse 88 08/26/23 12:14  Resp 12 08/26/23 12:14  SpO2 100 % 08/26/23 12:14  Vitals shown include unfiled device data.  Last Pain:  Vitals:   08/26/23 0847  TempSrc:   PainSc: 0-No pain         Complications: No notable events documented.

## 2023-08-27 ENCOUNTER — Encounter (HOSPITAL_COMMUNITY): Payer: Self-pay | Admitting: Student

## 2023-08-27 DIAGNOSIS — Z91199 Patient's noncompliance with other medical treatment and regimen due to unspecified reason: Secondary | ICD-10-CM

## 2023-08-27 DIAGNOSIS — T8452XA Infection and inflammatory reaction due to internal left hip prosthesis, initial encounter: Secondary | ICD-10-CM | POA: Diagnosis not present

## 2023-08-27 NOTE — Plan of Care (Signed)

## 2023-08-27 NOTE — Discharge Summary (Signed)
 Orthopaedic Trauma Service (OTS) Discharge Summary   Patient ID: [name] MRN: [id number] DOB/AGE: 1995/11/13 27 y.o.  Admit date: 08/26/2023 Discharge date: 08/27/2023  Admission Diagnoses: Left hip wound Patient noncompliance   Discharge Diagnoses:  Principal Problem:   Leg wound, left Active Problems:   Patient noncompliance   Past Medical History:  Diagnosis Date   Accidental overdose 04/13/2022   ADHD (attention deficit hyperactivity disorder)    Anemia    Anesthesia complication    woke up fighting after tonsillectomy   Anxiety    Cholecystitis    Cocaine abuse (HCC)    Complication of anesthesia    when wakes up  freaks out- like panic attack   Depression    Dysmenorrhea    Endometriosis    GAD (generalized anxiety disorder) 11/16/2018   GERD (gastroesophageal reflux disease)    Headache    migraines   Hyperlipidemia    Hypoglycemia    Involuntary commitment 11/18/2022   Major depressive disorder 11/16/2018   MDD (major depressive disorder) 12/09/2012   Morbid obesity with BMI of 40.0-44.9, adult (HCC)    Opioid withdrawal (HCC) 09/12/2022   Polysubstance abuse (HCC)    Pregnancy complicated by subutex  maintenance, antepartum (HCC)    Severe benzodiazepine use disorder (HCC)    Severe opioid use disorder (HCC)    Substance induced mood disorder (HCC)    Syncope    Tobacco abuse    Viral warts    hand   Vision abnormalities    wears glasses     Procedures Performed:  CPT 27087-Removal of foreign body/VAC sponge left hip CPT 26990-I&D of left hip/pelvis region CPT 97605-Wound vac placement to left hip  Discharged Condition: good/stable  Hospital Course: Patient presented to [facility] on [facility]/26/2023 for scheduled procedure on left lower extremity.  Patient doing operating room by [name] for the above procedure.  She tolerated this without complications.  Initial plan was for patient to discharge home from the PACU with  a home wound VAC machine unfortunately this was unable to be arranged in a timely manner on day of surgery.  Subsequently, patient was at admitted overnight for observation to allow appropriate timing to obtain needed DME. On 08/27/2023, the patient was tolerating diet, pain well controlled, vital signs stable, dressings clean, dry, intact and felt stable for discharge to home. Patient will follow up as below and knows to call with questions or concerns.     Consults: None  Significant Diagnostic Studies:   Results for orders placed or performed during the hospital encounter of 08/26/23 (from the past week)  CBC per protocol   Collection Time: 08/26/23  8:40 AM  Result Value Ref Range   WBC 4.2 4.0 - 10.5 K/uL   RBC 4.27 3.87 - 5.11 MIL/uL   Hemoglobin 11.0 (L) 12.0 - 15.0 g/dL   HCT 63.5 63.9 - 53.9 %   MCV 85.2 80.0 - 100.0 fL   MCH 25.8 (L) 26.0 - 34.0 pg   MCHC 30.2 30.0 - 36.0 g/dL   RDW 84.9 88.4 - 84.4 %   Platelets 239 150 - 400 K/uL   nRBC 0.0 0.0 - 0.2 %  Comprehensive metabolic panel per protocol   Collection Time: 08/26/23  8:40 AM  Result Value Ref Range   Sodium 136 135 - 145 mmol/L   Potassium 4.2 3.5 - 5.1 mmol/L   Chloride 103 98 - 111 mmol/L   CO2 22 22 - 32 mmol/L  Glucose, Bld 90 70 - 99 mg/dL   BUN 10 6 - 20 mg/dL   Creatinine, Ser 9.25 0.44 - 1.00 mg/dL   Calcium  8.9 8.9 - 10.3 mg/dL   Total Protein 5.6 (L) 6.5 - 8.1 g/dL   Albumin  3.2 (L) 3.5 - 5.0 g/dL   AST 47 (H) 15 - 41 U/L   ALT 47 (H) 0 - 44 U/L   Alkaline Phosphatase 134 (H) 38 - 126 U/L   Total Bilirubin 0.5 0.0 - 1.2 mg/dL   GFR, Estimated >39 >39 mL/min   Anion gap 11 5 - 15  Pregnancy, urine POC   Collection Time: 08/26/23  8:58 AM  Result Value Ref Range   Preg Test, Ur NEGATIVE NEGATIVE     Treatments: IV hydration, antibiotics: Ancef , analgesia: acetaminophen  and Subutex , and surgery: As above  Discharge Exam: General: Sitting upright edge of bed.  No acute distress.  Pleasant  and cooperative Respiratory: No increased work of breathing.  Operative Extremity (LLE): Wound VAC with good seal and function.  About 100 mL of bloody/serosanguineous output in wound VAC canister.  Some soreness over the hip but otherwise no significant tenderness at the thigh or lower leg.  Tolerates gentle hip, knee, ankle motion.  Neurovascularly at baseline.    Disposition: Discharge disposition: 01-Home or Self Care       Discharge Instructions     Call MD / Call 911   Complete by: As directed    If you experience chest pain or shortness of breath, CALL 911 and be transported to the hospital emergency room.  If you develope a fever above 101 F, pus (white drainage) or increased drainage or redness at the wound, or calf pain, call your surgeon's office.   Constipation Prevention   Complete by: As directed    Drink plenty of fluids.  Prune juice may be helpful.  You may use a stool softener, such as Colace (over the counter) 100 mg twice a day.  Use MiraLax  (over the counter) for constipation as needed.   Diet - low sodium heart healthy   Complete by: As directed    Increase activity slowly as tolerated   Complete by: As directed    Post-operative opioid taper instructions:   Complete by: As directed    POST-OPERATIVE OPIOID TAPER INSTRUCTIONS: It is important to wean off of your opioid medication as soon as possible. If you do not need pain medication after your surgery it is ok to stop day one. Opioids include: Codeine, Hydrocodone (Norco, Vicodin), Oxycodone (Percocet, oxycontin ) and hydromorphone  amongst others.  Long term and even short term use of opiods can cause: Increased pain response Dependence Constipation Depression Respiratory depression And more.  Withdrawal symptoms can include Flu like symptoms Nausea, vomiting And more Techniques to manage these symptoms Hydrate well Eat regular healthy meals Stay active Use relaxation techniques(deep breathing,  meditating, yoga) Do Not substitute Alcohol  to help with tapering If you have been on opioids for less than two weeks and do not have pain than it is ok to stop all together.  Plan to wean off of opioids This plan should start within one week post op of your joint replacement. Maintain the same interval or time between taking each dose and first decrease the dose.  Cut the total daily intake of opioids by one tablet each day Next start to increase the time between doses. The last dose that should be eliminated is the evening dose.  Allergies as of 08/27/2023       Reactions   Blueberry Fruit Extract Anaphylaxis   Contrast Media [iodinated Contrast Media] Anaphylaxis, Hives, Rash   Omnipaque  [iohexol ] Hives, Itching, Nausea And Vomiting, Swelling   Codeine Hives, Itching, Nausea And Vomiting   Latex Hives, Other (See Comments)   Welts, also   Robaxin  [methocarbamol ] Hives        Medication List     STOP taking these medications    cyclobenzaprine  10 MG tablet Commonly known as: FLEXERIL    gabapentin  300 MG capsule Commonly known as: NEURONTIN    oxyCODONE  5 MG immediate release tablet Commonly known as: Oxy IR/ROXICODONE        TAKE these medications    acetaminophen  500 MG tablet Commonly known as: TYLENOL  Take 1,000 mg by mouth every 6 (six) hours as needed for mild pain (pain score 1-3).   buprenorphine  8 MG Subl SL tablet Commonly known as: SUBUTEX  Place 1 tablet (8 mg total) under the tongue 2 (two) times daily.   Denta 5000 Plus 1.1 % Crea dental cream Generic drug: sodium fluoride  Place 1 Application onto teeth at bedtime.   hydrOXYzine  25 MG tablet Commonly known as: ATARAX  Take 25 mg by mouth every 8 (eight) hours as needed for anxiety.   naloxone  4 MG/0.1ML Liqd nasal spray kit Commonly known as: NARCAN  Place 1 spray into the nose daily as needed (overdose).   ondansetron  4 MG disintegrating tablet Commonly known as: ZOFRAN -ODT Take 4  mg by mouth every 8 (eight) hours as needed for nausea or vomiting.   polyethylene glycol powder 17 GM/SCOOP powder Commonly known as: GLYCOLAX /MIRALAX  Take 17 g by mouth daily as needed (constipation).   QUEtiapine  100 MG tablet Commonly known as: SEROQUEL  Take 200 mg by mouth at bedtime.               Durable Medical Equipment  (From admission, onward)           Start     Ordered   08/26/23 1212  For home use only DME Negative pressure wound device  Once       Comments: Wound vac sponge does not need to be changed, wound vac sponge changes will be done weekly in the OR  Wound size - 8 (L) x 2 (W) x 7 cm (D)  Question Answer Comment  Frequency of dressing change Other see comments   Length of need 3 Months   Dressing type Foam   Amount of suction 125 mm/Hg   Pressure application Continuous pressure   Supplies 10 canisters and 15 dressings per month for duration of therapy      08/26/23 1214            Follow-up Information     Haddix, Franky SQUIBB, MD. Go in 1 week(s).   Specialty: Orthopedic Surgery Why: wound vac change Contact information: [address] [place] [address] [phone]                 Discharge Instructions and Plan: Patient will be discharged to home.  No chemical DVT prophylaxis required.  Patient will continue with home [employer] wound VAC to the left lower extremity.  She has been provided with the appropriate supplies for wound VAC canister changes at home.  Will plan to have patient follow-up in 1 week for wound VAC change with [name].  Signed:  Lauraine PATRIC Moores, PA-C ?[phone]? (phone) 08/27/2023, 9:24 AM  [facility] [address] [place] [address] [phone] MAXIMINO MILLING (F)

## 2023-08-27 NOTE — Plan of Care (Signed)
  Problem: Education: Goal: Knowledge of General Education information will improve Description: Including pain rating scale, medication(s)/side effects and non-pharmacologic comfort measures 08/27/2023 0235 by Evern Monica HERO, RN Outcome: Progressing 08/26/2023 2148 by Evern Monica HERO, RN Outcome: Progressing   Problem: Health Behavior/Discharge Planning: Goal: Ability to manage health-related needs will improve 08/27/2023 0235 by Evern Monica HERO, RN Outcome: Progressing 08/26/2023 2148 by Evern Monica HERO, RN Outcome: Progressing   Problem: Clinical Measurements: Goal: Ability to maintain clinical measurements within normal limits will improve 08/27/2023 0235 by Evern Monica HERO, RN Outcome: Progressing 08/26/2023 2148 by Evern Monica HERO, RN Outcome: Progressing Goal: Will remain free from infection 08/27/2023 0235 by Evern Monica HERO, RN Outcome: Progressing 08/26/2023 2148 by Evern Monica HERO, RN Outcome: Progressing Goal: Diagnostic test results will improve 08/27/2023 0235 by Evern Monica HERO, RN Outcome: Progressing 08/26/2023 2148 by Evern Monica HERO, RN Outcome: Progressing Goal: Respiratory complications will improve 08/27/2023 0235 by Evern Monica HERO, RN Outcome: Progressing 08/26/2023 2148 by Evern Monica HERO, RN Outcome: Progressing Goal: Cardiovascular complication will be avoided 08/27/2023 0235 by Evern Monica HERO, RN Outcome: Progressing 08/26/2023 2148 by Evern Monica HERO, RN Outcome: Progressing   Problem: Activity: Goal: Risk for activity intolerance will decrease 08/27/2023 0235 by Evern Monica HERO, RN Outcome: Progressing 08/26/2023 2148 by Evern Monica HERO, RN Outcome: Progressing   Problem: Nutrition: Goal: Adequate nutrition will be maintained 08/27/2023 0235 by Evern Monica HERO, RN Outcome: Progressing 08/26/2023 2148 by Evern Monica HERO, RN Outcome: Progressing   Problem: Coping: Goal: Level of anxiety will decrease 08/27/2023  0235 by Evern Monica HERO, RN Outcome: Progressing 08/26/2023 2148 by Evern Monica HERO, RN Outcome: Progressing   Problem: Elimination: Goal: Will not experience complications related to bowel motility 08/27/2023 0235 by Evern Monica HERO, RN Outcome: Progressing 08/26/2023 2148 by Evern Monica HERO, RN Outcome: Progressing Goal: Will not experience complications related to urinary retention 08/27/2023 0235 by Evern Monica HERO, RN Outcome: Progressing 08/26/2023 2148 by Evern Monica HERO, RN Outcome: Progressing   Problem: Pain Managment: Goal: General experience of comfort will improve and/or be controlled 08/27/2023 0235 by Evern Monica HERO, RN Outcome: Progressing 08/26/2023 2148 by Evern Monica HERO, RN Outcome: Progressing   Problem: Safety: Goal: Ability to remain free from injury will improve 08/27/2023 0235 by Evern Monica HERO, RN Outcome: Progressing 08/26/2023 2148 by Evern Monica HERO, RN Outcome: Progressing   Problem: Skin Integrity: Goal: Risk for impaired skin integrity will decrease 08/27/2023 0235 by Evern Monica HERO, RN Outcome: Progressing 08/26/2023 2148 by Evern Monica HERO, RN Outcome: Progressing

## 2023-08-27 NOTE — H&P (View-Only) (Signed)
 Orthopaedic Trauma Progress Note  SUBJECTIVE: Doing okay this morning.  Has not required any medications postoperatively other than her home dose Subutex .  Just ordered breakfast.  Not having significant pain.  Awaiting delivery of home wound VAC machine.  States she has canisters at home.  No chest pain. No SOB. No nausea/vomiting. No other complaints.   OBJECTIVE:  Vitals:   08/27/23 0606 08/27/23 0820  BP: 127/73 106/64  Pulse: 85 80  Resp: 18   Temp: 97.9 F (36.6 C) 97.6 F (36.4 C)  SpO2: 96% 100%    Opiates Today (MME): Today's  total administered Morphine  Milligram Equivalents: 0 Opiates Yesterday (MME): Yesterday's total administered Morphine  Milligram Equivalents: 285  General: Sitting upright edge of bed.  No acute distress.  Pleasant and cooperative Respiratory: No increased work of breathing.  Operative Extremity (LLE): Wound VAC with good seal and function.  About 100 mL of bloody/serosanguineous output in wound VAC canister.  Some soreness over the hip but otherwise no significant tenderness at the thigh or lower leg.  Tolerates gentle hip, knee, ankle motion.  Neurovascularly at baseline.   LABS:  Results for orders placed or performed during the hospital encounter of 08/26/23 (from the past 24 hours)  Pregnancy, urine POC     Status: None   Collection Time: 08/26/23  8:58 AM  Result Value Ref Range   Preg Test, Ur NEGATIVE NEGATIVE    ASSESSMENT: [name] is a 28 y.o. female, 1 Day Post-Op s/p APPLICATION OF WOUND VAC LEFT HIP  CV/Blood loss: Hemoglobin 11.0 on admission.  Hemodynamically stable  PLAN: Weightbearing: WBAT LLE ROM: Okay for gentle hip motion as tolerated Incisional and dressing care: Dressings left intact until follow-up  Showering: Okay for bed bath.  Avoid getting in the shower with the wound VAC on. Orthopedic device(s): Wound Vac: LLE  Pain management: Continue home dose medications VTE prophylaxis: No chemical PPx needed.   SCDs ID: Perioperative ABX complete Foley/Lines:  No foley, KVO IVFs Dispo: Patient okay for discharge when she receives home [employer] wound VAC  Follow - up plan: 1 week for wound vac change   Contact information:  Franky Light MD, [name] PA-C. After hours and holidays please check [email or web address] for group call information for [facility]   Lauraine PATRIC Moores, PA-C [phone] (office) [email or web address]

## 2023-08-27 NOTE — Progress Notes (Signed)
 Orthopaedic Trauma Progress Note  SUBJECTIVE: Doing okay this morning.  Has not required any medications postoperatively other than her home dose Subutex .  Just ordered breakfast.  Not having significant pain.  Awaiting delivery of home wound VAC machine.  States she has canisters at home.  No chest pain. No SOB. No nausea/vomiting. No other complaints.   OBJECTIVE:  Vitals:   08/27/23 0606 08/27/23 0820  BP: 127/73 106/64  Pulse: 85 80  Resp: 18   Temp: 97.9 F (36.6 C) 97.6 F (36.4 C)  SpO2: 96% 100%    Opiates Today (MME): Today's  total administered Morphine  Milligram Equivalents: 0 Opiates Yesterday (MME): Yesterday's total administered Morphine  Milligram Equivalents: 285  General: Sitting upright edge of bed.  No acute distress.  Pleasant and cooperative Respiratory: No increased work of breathing.  Operative Extremity (LLE): Wound VAC with good seal and function.  About 100 mL of bloody/serosanguineous output in wound VAC canister.  Some soreness over the hip but otherwise no significant tenderness at the thigh or lower leg.  Tolerates gentle hip, knee, ankle motion.  Neurovascularly at baseline.   LABS:  Results for orders placed or performed during the hospital encounter of 08/26/23 (from the past 24 hours)  Pregnancy, urine POC     Status: None   Collection Time: 08/26/23  8:58 AM  Result Value Ref Range   Preg Test, Ur NEGATIVE NEGATIVE    ASSESSMENT: [name] is a 28 y.o. female, 1 Day Post-Op s/p APPLICATION OF WOUND VAC LEFT HIP  CV/Blood loss: Hemoglobin 11.0 on admission.  Hemodynamically stable  PLAN: Weightbearing: WBAT LLE ROM: Okay for gentle hip motion as tolerated Incisional and dressing care: Dressings left intact until follow-up  Showering: Okay for bed bath.  Avoid getting in the shower with the wound VAC on. Orthopedic device(s): Wound Vac: LLE  Pain management: Continue home dose medications VTE prophylaxis: No chemical PPx needed.   SCDs ID: Perioperative ABX complete Foley/Lines:  No foley, KVO IVFs Dispo: Patient okay for discharge when she receives home [employer] wound VAC  Follow - up plan: 1 week for wound vac change   Contact information:  Franky Light MD, [name] PA-C. After hours and holidays please check [email or web address] for group call information for [facility]   Lauraine PATRIC Moores, PA-C [phone] (office) [email or web address]

## 2023-08-27 NOTE — Progress Notes (Signed)
 Transition of Care [facility]) - Inpatient Brief Assessment   Patient Details  Name: [name] MRN: [id number] Date of Birth: July 19, 1995  [facility]) CM/SW Contact:    [name], RN Phone Number: 08/27/2023, 11:23 AM   Clinical Narrative: I met with the patient at the bedside and provided the patient with her wound vac equipment for home.  Assignment of benefits form was returned to [facility] via fax at fax [phone].  Bedside nursing is aware that wound vac equipment is at the bedside and patient plans to return home with her mother.   Transition of Care Asessment: Insurance and Status: (P) Insurance coverage has been reviewed Patient has primary care physician: (P) Yes Home environment has been reviewed: (P) from home with mother Prior level of function:: (P) Independent Prior/Current Home Services: (P) No current home services (Patient will have wound vac changes weekly in the OR per [name]) Social Drivers of Health Review: (P) SDOH reviewed interventions complete Readmission risk has been reviewed: (P) Yes Transition of care needs: (P) transition of care needs identified, TOC will continue to follow

## 2023-08-27 NOTE — TOC Progression Note (Signed)
 Transition of Care [facility]) - Progression Note    Patient Details  Name: [name] MRN: [id number] Date of Birth: [birth year]  [facility]) CM/SW Contact  [name], RN Phone Number: 08/27/2023, 9:11 AM  Clinical Narrative:    CM called and left a detailed message with [name], CM with [employer] rep - [phone].  She called back and states that the insurance have been approved and wound vac kit will be delivered to the bedside in the next 2 hours by CM.  Patient will have scheduled OR visits to have wound vac changes per [name], PA/ [name] and no home health will be needed per MD.  [facility] with IP Care management will continue to follow the patient for needs - wound vac to be delivered today to the bedside.                     Expected Discharge Plan and Services         Expected Discharge Date: 08/27/23                                     Social Drivers of Health (SDOH) Interventions SDOH Screenings   Food Insecurity: No Food Insecurity (08/27/2023)  Housing: Low Risk  (08/27/2023)  Transportation Needs: No Transportation Needs (08/27/2023)  Utilities: Not At Risk (08/27/2023)  Alcohol  Screen: Medium Risk (04/10/2017)  Depression (PHQ2-9): Low Risk  (11/24/2022)  Recent Concern: Depression (PHQ2-9) - High Risk (11/22/2022)  Social Connections: Patient Unable To Answer (07/11/2023)  Tobacco Use: High Risk (08/24/2023)    Readmission Risk Interventions    04/15/2022    6:04 PM  Readmission Risk Prevention Plan  Transportation Screening Complete  PCP or Specialist Appt within 5-7 Days Complete  Home Care Screening Complete  Medication Review (RN CM) Complete

## 2023-08-27 NOTE — Progress Notes (Signed)
 Explained discharge instructions to patient. Reviewed follow up appointment and next medication administration times. Also reviewed education. Patient verbalized having an understanding for instructions given. All belongings are in the patient's possession. IV was removed by the floor staff. Switched out her woundvac to home woundvac. No other needs verbalized. Will transport downstairs to the discharge lounge.

## 2023-09-02 ENCOUNTER — Ambulatory Visit: Payer: Self-pay | Admitting: Student

## 2023-09-03 ENCOUNTER — Ambulatory Visit: Payer: Self-pay | Admitting: Student

## 2023-09-03 ENCOUNTER — Other Ambulatory Visit: Payer: Self-pay | Admitting: Student

## 2023-09-03 ENCOUNTER — Encounter (HOSPITAL_COMMUNITY): Payer: Self-pay | Admitting: Student

## 2023-09-03 ENCOUNTER — Other Ambulatory Visit: Payer: Self-pay

## 2023-09-03 DIAGNOSIS — S31109D Unspecified open wound of abdominal wall, unspecified quadrant without penetration into peritoneal cavity, subsequent encounter: Secondary | ICD-10-CM

## 2023-09-03 NOTE — Progress Notes (Signed)
 SDW CALL  Patient was given pre-op instructions over the phone. The opportunity was given for the patient to ask questions. No further questions asked. Patient verbalized understanding of instructions given.  Date & Arrival time- September 04, 2023 PCP - NO PCP  Cardiologist -   PPM/ICD - denies Device Orders - n/a Rep Notified - n/a  Chest x-ray - 07-11-23 EKG - 07-11-23 Stress Test - denies ECHO - denies Cardiac Cath - denies  Sleep Study - denies CPAP - n/a  Dm denies  Blood Thinner Instructions: denies Aspirin  Instructions:denies  ERAS Protcol - clear liquids until 4:30 am. PRE-SURGERY Ensure or G2-   COVID TEST-    Anesthesia review: Hx of Difficulty airwat  Patient denies shortness of breath, fever, cough and chest pain over the phone call   All instructions explained to the patient, with a verbal understanding of the material. Patient agrees to go over the instructions while at home for a better understanding.

## 2023-09-03 NOTE — Anesthesia Preprocedure Evaluation (Addendum)
 Anesthesia Evaluation  Patient identified by MRN, date of birth, ID band Patient awake    Reviewed: Allergy & Precautions, NPO status , Patient's Chart, lab work & pertinent test results  History of Anesthesia Complications Negative for: history of anesthetic complications  Airway Mallampati: II  TM Distance: >3 FB Neck ROM: Full    Dental  (+) Poor Dentition, Chipped,    Pulmonary Current Smoker and Patient abstained from smoking.   Pulmonary exam normal        Cardiovascular negative cardio ROS Normal cardiovascular exam     Neuro/Psych  Headaches  Anxiety Depression       GI/Hepatic ,GERD  Controlled,,(+)     substance abuse (on Subutex )  cocaine use  Endo/Other    Class 3 obesity (BMI 45)  Renal/GU negative Renal ROS  negative genitourinary   Musculoskeletal negative musculoskeletal ROS (+)  narcotic dependent  Abdominal   Peds  Hematology  (+) Blood dyscrasia (Hgb 11.0), anemia   Anesthesia Other Findings Day of surgery medications reviewed with patient.  Reproductive/Obstetrics negative OB ROS                              Anesthesia Physical Anesthesia Plan  ASA: 2  Anesthesia Plan: General   Post-op Pain Management: Tylenol  PO (pre-op)*, Precedex , Toradol  IV (intra-op)* and Dilaudid  IV   Induction: Intravenous  PONV Risk Score and Plan: 3 and Treatment may vary due to age or medical condition, Ondansetron , Dexamethasone , Midazolam  and Scopolamine  patch - Pre-op  Airway Management Planned: LMA  Additional Equipment: None  Intra-op Plan:   Post-operative Plan: Extubation in OR  Informed Consent: I have reviewed the patients History and Physical, chart, labs and discussed the procedure including the risks, benefits and alternatives for the proposed anesthesia with the patient or authorized representative who has indicated his/her understanding and acceptance.      Dental advisory given  Plan Discussed with: CRNA  Anesthesia Plan Comments: (PAT note written 09/03/2023 by Allison Zelenak, PA-C.  )         Anesthesia Quick Evaluation
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
## 2023-09-04 ENCOUNTER — Ambulatory Visit (HOSPITAL_COMMUNITY)
Admission: RE | Admit: 2023-09-04 | Discharge: 2023-09-04 | Disposition: A | Discharge status: Home / Self Care | Payer: MEDICAID | Attending: Student | Admitting: Student

## 2023-09-04 ENCOUNTER — Other Ambulatory Visit: Payer: Self-pay

## 2023-09-04 ENCOUNTER — Encounter (HOSPITAL_COMMUNITY)
Admission: RE | Disposition: A | Discharge status: Home / Self Care | Payer: Self-pay | Source: Home / Self Care | Attending: Student

## 2023-09-04 ENCOUNTER — Ambulatory Visit (HOSPITAL_COMMUNITY): Payer: MEDICAID | Admitting: Vascular Surgery

## 2023-09-04 ENCOUNTER — Ambulatory Visit: Payer: Self-pay | Admitting: Student

## 2023-09-04 ENCOUNTER — Encounter (HOSPITAL_COMMUNITY): Payer: MEDICAID | Admitting: Vascular Surgery

## 2023-09-04 DIAGNOSIS — Z6841 Body Mass Index (BMI) 40.0 and over, adult: Secondary | ICD-10-CM

## 2023-09-04 DIAGNOSIS — S71032D Puncture wound without foreign body, left hip, subsequent encounter: Secondary | ICD-10-CM

## 2023-09-04 DIAGNOSIS — F32A Depression, unspecified: Secondary | ICD-10-CM | POA: Diagnosis not present

## 2023-09-04 DIAGNOSIS — E66813 Obesity, class 3: Secondary | ICD-10-CM | POA: Insufficient documentation

## 2023-09-04 DIAGNOSIS — F17210 Nicotine dependence, cigarettes, uncomplicated: Secondary | ICD-10-CM | POA: Diagnosis not present

## 2023-09-04 DIAGNOSIS — Z79891 Long term (current) use of opiate analgesic: Secondary | ICD-10-CM | POA: Diagnosis not present

## 2023-09-04 DIAGNOSIS — T8131XA Disruption of external operation (surgical) wound, not elsewhere classified, initial encounter: Secondary | ICD-10-CM | POA: Diagnosis present

## 2023-09-04 DIAGNOSIS — Y838 Other surgical procedures as the cause of abnormal reaction of the patient, or of later complication, without mention of misadventure at the time of the procedure: Secondary | ICD-10-CM | POA: Diagnosis not present

## 2023-09-04 DIAGNOSIS — F411 Generalized anxiety disorder: Secondary | ICD-10-CM | POA: Insufficient documentation

## 2023-09-04 DIAGNOSIS — S31109D Unspecified open wound of abdominal wall, unspecified quadrant without penetration into peritoneal cavity, subsequent encounter: Secondary | ICD-10-CM

## 2023-09-04 DIAGNOSIS — F418 Other specified anxiety disorders: Secondary | ICD-10-CM

## 2023-09-04 HISTORY — PX: INCISION AND DRAINAGE HIP: SHX1801

## 2023-09-04 HISTORY — PX: APPLICATION OF WOUND VAC: SHX5189

## 2023-09-04 LAB — POCT PREGNANCY, URINE: Preg Test, Ur: NEGATIVE

## 2023-09-04 SURGERY — APPLICATION, WOUND VAC
Anesthesia: General | Site: Hip | Laterality: Left

## 2023-09-04 MED ORDER — MIDAZOLAM HCL 2 MG/2ML IJ SOLN
INTRAMUSCULAR | Status: DC | PRN
  Administered 2023-09-04: 2 mg via INTRAVENOUS

## 2023-09-04 MED ORDER — DROPERIDOL 2.5 MG/ML IJ SOLN: 0.6250 mg | Freq: Once | INTRAMUSCULAR | Status: DC | PRN

## 2023-09-04 MED ORDER — 0.9 % SODIUM CHLORIDE (POUR BTL) OPTIME
TOPICAL | Status: DC | PRN
Start: 2023-09-04 — End: 2023-09-04
  Administered 2023-09-04: 1000 mL

## 2023-09-04 MED ORDER — SCOPOLAMINE 1 MG/3DAYS TD PT72SCOPOLAMINE 1 MG/3DAYS
1.0000 | MEDICATED_PATCH | Freq: Once | TRANSDERMAL | Status: DC
Start: 2023-09-04 — End: 2023-09-04
  Administered 2023-09-04: 1.5 mg via TRANSDERMAL
  Filled 2023-09-04: qty 1

## 2023-09-04 MED ORDER — ONDANSETRON HCL 4 MG/2ML IJ SOLN
INTRAMUSCULAR | Status: AC
  Filled 2023-09-04: qty 2

## 2023-09-04 MED ORDER — ONDANSETRON HCL 4 MG/2ML IJ SOLN
INTRAMUSCULAR | Status: DC | PRN
  Administered 2023-09-04: 4 mg via INTRAVENOUS

## 2023-09-04 MED ORDER — DEXMEDETOMIDINE HCL IN NACL 80 MCG/20ML IV SOLN
INTRAVENOUS | Status: DC | PRN
  Administered 2023-09-04: 12 ug via INTRAVENOUS
  Administered 2023-09-04: 4 ug via INTRAVENOUS

## 2023-09-04 MED ORDER — DEXAMETHASONE SODIUM PHOSPHATE 10 MG/ML IJ SOLN
INTRAMUSCULAR | Status: DC | PRN
  Administered 2023-09-04: 10 mg via INTRAVENOUS

## 2023-09-04 MED ORDER — LIDOCAINE 2% (20 MG/ML) 5 ML SYRINGE
INTRAMUSCULAR | Status: DC | PRN
  Administered 2023-09-04: 100 mg via INTRAVENOUS

## 2023-09-04 MED ORDER — CHLORHEXIDINE GLUCONATE 0.12 % MT SOLN
15.0000 mL | Freq: Once | OROMUCOSAL | Status: AC
  Administered 2023-09-04: 15 mL via OROMUCOSAL
  Filled 2023-09-04: qty 15

## 2023-09-04 MED ORDER — FENTANYL CITRATE (PF) 250 MCG/5ML IJ SOLN
INTRAMUSCULAR | Status: DC | PRN
  Administered 2023-09-04: 100 ug via INTRAVENOUS
  Administered 2023-09-04: 50 ug via INTRAVENOUS

## 2023-09-04 MED ORDER — PROPOFOL 10 MG/ML IV BOLUS
INTRAVENOUS | Status: AC
  Filled 2023-09-04: qty 20

## 2023-09-04 MED ORDER — ACETAMINOPHEN 500 MG PO TABS
1000.0000 mg | ORAL_TABLET | Freq: Once | ORAL | Status: AC
  Administered 2023-09-04: 1000 mg via ORAL
  Filled 2023-09-04: qty 2

## 2023-09-04 MED ORDER — MIDAZOLAM HCL 2 MG/2ML IJ SOLN
INTRAMUSCULAR | Status: AC
  Filled 2023-09-04: qty 2

## 2023-09-04 MED ORDER — DEXMEDETOMIDINE HCL IN NACL 80 MCG/20ML IV SOLN
INTRAVENOUS | Status: AC
  Filled 2023-09-04: qty 20

## 2023-09-04 MED ORDER — HYDROMORPHONE HCL 1 MG/ML IJ SOLN: 0.2500 mg | INTRAMUSCULAR | Status: DC | PRN

## 2023-09-04 MED ORDER — KETOROLAC TROMETHAMINE 30 MG/ML IJ SOLN
INTRAMUSCULAR | Status: AC
  Filled 2023-09-04: qty 1

## 2023-09-04 MED ORDER — DEXAMETHASONE SODIUM PHOSPHATE 10 MG/ML IJ SOLN
INTRAMUSCULAR | Status: AC
  Filled 2023-09-04: qty 1

## 2023-09-04 MED ORDER — LACTATED RINGERS IV SOLN: INTRAVENOUS | Status: DC

## 2023-09-04 MED ORDER — FENTANYL CITRATE (PF) 250 MCG/5ML IJ SOLN
INTRAMUSCULAR | Status: AC
  Filled 2023-09-04: qty 5

## 2023-09-04 MED ORDER — PROPOFOL 10 MG/ML IV BOLUS
INTRAVENOUS | Status: DC | PRN
  Administered 2023-09-04: 170 mg via INTRAVENOUS
  Administered 2023-09-04: 30 mg via INTRAVENOUS

## 2023-09-04 MED ORDER — ORAL CARE MOUTH RINSE: 15.0000 mL | Freq: Once | OROMUCOSAL | Status: AC

## 2023-09-04 MED ORDER — KETOROLAC TROMETHAMINE 30 MG/ML IJ SOLN
INTRAMUSCULAR | Status: DC | PRN
  Administered 2023-09-04: 30 mg via INTRAVENOUS

## 2023-09-04 MED ORDER — CEFAZOLIN SODIUM-DEXTROSE 2-4 GM/100ML-% IV SOLN
2.0000 g | INTRAVENOUS | Status: AC
  Administered 2023-09-04: 2 g via INTRAVENOUS
  Filled 2023-09-04: qty 100

## 2023-09-04 MED ORDER — LIDOCAINE 2% (20 MG/ML) 5 ML SYRINGE
INTRAMUSCULAR | Status: AC
  Filled 2023-09-04: qty 5

## 2023-09-04 SURGICAL SUPPLY — 36 items
BAG COUNTER SPONGE SURGICOUNT (BAG) ×2 IMPLANT
BNDG ELASTIC 4X5.8 VLCR STR LF (GAUZE/BANDAGES/DRESSINGS) IMPLANT
BNDG GAUZE DERMACEA FLUFF 4 (GAUZE/BANDAGES/DRESSINGS) IMPLANT
CANISTER SUCTION 3000ML PPV (SUCTIONS) ×2 IMPLANT
CANISTER WOUND CARE 500ML ATS (WOUND CARE) ×2 IMPLANT
COVER SURGICAL LIGHT HANDLE (MISCELLANEOUS) ×2 IMPLANT
DRAPE DERMATAC (DRAPES) IMPLANT
DRAPE HALF SHEET 40X57 (DRAPES) IMPLANT
DRAPE INCISE IOBAN 66X45 STRL (DRAPES) IMPLANT
DRAPE SURG 17X23 STRL (DRAPES) IMPLANT
DRAPE SURG ORHT 6 SPLT 77X108 (DRAPES) IMPLANT
DRESSING VERAFLO CLEANS CC MED (GAUZE/BANDAGES/DRESSINGS) IMPLANT
DRSG VAC GRANUFOAM LG (GAUZE/BANDAGES/DRESSINGS) IMPLANT
DRSG VAC GRANUFOAM MED (GAUZE/BANDAGES/DRESSINGS) IMPLANT
DRSG VAC GRANUFOAM SM (GAUZE/BANDAGES/DRESSINGS) IMPLANT
ELECTRODE REM PT RTRN 9FT ADLT (ELECTROSURGICAL) ×2 IMPLANT
GAUZE PAD ABD 8X10 STRL (GAUZE/BANDAGES/DRESSINGS) IMPLANT
GAUZE SPONGE 4X4 12PLY STRL (GAUZE/BANDAGES/DRESSINGS) IMPLANT
GAUZE XEROFORM 5X9 LF (GAUZE/BANDAGES/DRESSINGS) ×2 IMPLANT
GLOVE BIO SURGEON STRL SZ 6.5 (GLOVE) ×6 IMPLANT
GLOVE BIO SURGEON STRL SZ7.5 (GLOVE) ×8 IMPLANT
GLOVE BIOGEL PI IND STRL 6.5 (GLOVE) ×2 IMPLANT
GLOVE BIOGEL PI IND STRL 7.5 (GLOVE) ×2 IMPLANT
GLOVE SURG SS PI 6.5 STRL IVOR (GLOVE) IMPLANT
GLOVE SURG SS PI 7.5 STRL IVOR (GLOVE) IMPLANT
GOWN STRL REUS W/ TWL LRG LVL3 (GOWN DISPOSABLE) ×4 IMPLANT
KIT BASIN OR (CUSTOM PROCEDURE TRAY) ×2 IMPLANT
KIT TURNOVER KIT B (KITS) ×2 IMPLANT
NS IRRIG 1000ML POUR BTL (IV SOLUTION) ×2 IMPLANT
PACK ORTHO EXTREMITY (CUSTOM PROCEDURE TRAY) ×2 IMPLANT
PAD ARMBOARD POSITIONER FOAM (MISCELLANEOUS) ×2 IMPLANT
STAPLER SKIN PROX 35W (STAPLE) IMPLANT
TOWEL GREEN STERILE (TOWEL DISPOSABLE) ×2 IMPLANT
TOWEL GREEN STERILE FF (TOWEL DISPOSABLE) ×2 IMPLANT
TUBE CONNECTING 12X1/4 (SUCTIONS) ×2 IMPLANT
YANKAUER SUCT BULB TIP NO VENT (SUCTIONS) ×2 IMPLANT

## 2023-09-04 NOTE — Op Note (Signed)
 Orthopaedic Surgery Operative Note (CSN: [id number] ) Date of Surgery: 09/04/2023  Admit Date: 09/04/2023   Diagnoses: Pre-Op Diagnoses: Left hip wound  Post-Op Diagnosis: Same  Procedures: CPT 11042-Debridement of left hip CPT 97605-Wound vac placement left hip  Surgeons : Primary: Kendal Franky SQUIBB, MD  Assistant: [name], PA-C  Location: OR 3   Anesthesia: General   Antibiotics: Ancef  2g preop   Tourniquet time: None    Estimated Blood Loss: Minimal  Complications:* No complications entered in OR log *   Specimens:* No specimens in log *   Implants: * No implants in log *   Indications for Surgery: 28 year old female whose had a left degloving wound to her left hip that has undergone serial debridements.  She has also had wound VAC changes done.  Last 1 was done last week.  She was indicated for return trip to the operating room for wound VAC change.  Risks and benefits were discussed with the patient and her mother.  They agreed to proceed with surgery.  Operative Findings: 1.  Debridement of left subcutaneous tissue and wound with wound VAC change.  Size of the wound was 3 x 1.5 cm with anterior undermining of 4 cm and posterior undermining of 5 cm.  Procedure: The patient was identified in the preoperative holding area. Consent was confirmed with the patient and their family and all questions were answered. The operative extremity was marked after confirmation with the patient. she was then brought back to the operating room by our anesthesia colleagues.  She was placed under general static and carefully transferred over to radiolucent flattop table.  Her left hip was then prepped and draped in usual sterile fashion.  A timeout was performed to verify the patient, the procedure, and the extremity.  Preoperative antibiotics were dosed.  The previous VAC sponge was removed.  The wound bed was nice and healthy.  There was still some residual sponge that was left over  from where it had adhered from the 6 weeks that she had not had a change.  I used a Cobb elevator to debride this and removed it.  I then irrigated and proceeded to place another wound VAC sponge.  I made the measurements prior to placing the sponge which are noted above.  I connected to 125 mmHg.  Got a good seal and the patient was awoken from anesthesia and taken to the PACU in stable condition.   Debridement type: Excisional Debridement  Side: left  Body Location: Hip  Tools used for debridement: curette and rongeur  Pre-debridement Wound size (cm):   Length: 3        Width: 1.5     Depth: 4/5cm   Post-debridement Wound size (cm):  Same  Debridement depth beyond dead/damaged tissue down to healthy viable tissue: yes  Tissue layer involved: skin, subcutaneous tissue  Nature of tissue removed: Slough  Irrigation volume:     Irrigation fluid type: Normal Saline   Post Op Plan/Instructions: Patient be weightbearing as tolerated to the left lower extremity.  She will return to the operating room for another wound VAC change likely next week.  Hopefully we can start transition to outpatient office wound VAC changes.  I was present and performed the entire surgery.  [name], PA-C did assist me throughout the case. An assistant was necessary given the difficulty in approach, maintenance of reduction and ability to instrument the fracture.   Franky Kendal, MD [facility]

## 2023-09-04 NOTE — Transfer of Care (Signed)
 Immediate Anesthesia Transfer of Care Note  Patient: [name]  Procedure(s) Performed: APPLICATION, WOUND VAC LEFT HIP (Left) IRRIGATION AND DEBRIDEMENT LEFT HIP (Left: Hip)  Patient Location: PACU  Anesthesia Type:General  Level of Consciousness: drowsy, patient cooperative, and responds to stimulation  Airway & Oxygen Therapy: Patient Spontanous Breathing and Patient connected to nasal cannula oxygen  Post-op Assessment: Report given to RN, Post -op Vital signs reviewed and stable, Patient moving all extremities X 4, and Patient able to stick tongue midline  Post vital signs: Reviewed and stable  Last Vitals:  Vitals Value Taken Time  BP 92/41 09/04/23 08:12  Temp 97.8   Pulse 96 09/04/23 08:15  Resp 13 09/04/23 08:15  SpO2 99 % 09/04/23 08:15  Vitals shown include unfiled device data.  Last Pain:  Vitals:   09/04/23 0648  TempSrc:   PainSc: 0-No pain         Complications: No notable events documented.

## 2023-09-04 NOTE — Interval H&P Note (Signed)
 History and Physical Interval Note:  09/04/2023 7:18 AM  [name]  has presented today for surgery, with the diagnosis of Left hip wound.  The various methods of treatment have been discussed with the patient and family. After consideration of risks, benefits and other options for treatment, the patient has consented to  Procedure(s): APPLICATION, WOUND VAC LEFT HIP (Left) as a surgical intervention.  The patient's history has been reviewed, patient examined, no change in status, stable for surgery.  I have reviewed the patient's chart and labs.  Questions were answered to the patient's satisfaction.     Mariacristina Aday P Tayte Childers

## 2023-09-04 NOTE — Progress Notes (Signed)
 Spoke to [name] regarding ordered CBC and CMP.  He stated they were not needed due to recent labs from 08/26/23

## 2023-09-04 NOTE — Anesthesia Postprocedure Evaluation (Signed)
 Anesthesia Post Note  Patient: [name]  Procedure(s) Performed: APPLICATION, WOUND VAC LEFT HIP (Left) IRRIGATION AND DEBRIDEMENT LEFT HIP (Left: Hip)     Patient location during evaluation: PACU Anesthesia Type: General Level of consciousness: awake and alert Pain management: pain level controlled Vital Signs Assessment: post-procedure vital signs reviewed and stable Respiratory status: spontaneous breathing, nonlabored ventilation and respiratory function stable Cardiovascular status: blood pressure returned to baseline Postop Assessment: no apparent nausea or vomiting Anesthetic complications: no   No notable events documented.  Last Vitals:  Vitals:   09/04/23 0900 09/04/23 0915  BP: 103/63 104/67  Pulse: 86 88  Resp: 12 12  Temp:  36.5 C  SpO2: 100% 100%    Last Pain:  Vitals:   09/04/23 0915  TempSrc:   PainSc: 0-No pain                 Vertell Row

## 2023-09-04 NOTE — Anesthesia Procedure Notes (Signed)
 Procedure Name: LMA Insertion Date/Time: 09/04/2023 7:32 AM  Performed by: Harrold Macintosh, CRNAPre-anesthesia Checklist: Patient identified, Emergency Drugs available, Suction available, Patient being monitored and Timeout performed Patient Re-evaluated:Patient Re-evaluated prior to induction Oxygen Delivery Method: Circle system utilized Preoxygenation: Pre-oxygenation with 100% oxygen Induction Type: IV induction LMA: LMA inserted LMA Size: 4.0 Number of attempts: 1 Placement Confirmation: positive ETCO2 and breath sounds checked- equal and bilateral Tube secured with: Tape Dental Injury: Teeth and Oropharynx as per pre-operative assessment

## 2023-09-04 NOTE — Discharge Instructions (Addendum)
 [facility] Discharge Instructions   General Discharge Instructions  WEIGHT BEARING STATUS:weightbearing as tolerated  RANGE OF MOTION/ACTIVITY: Ok for gentle hip motion as tolerated  Wound Care: Continue to change wound vac canister as needed. Do not get wound wet  DVT/PE prophylaxis: None  Diet: as you were eating previously.  Can use over the counter stool softeners and bowel preparations, such as Miralax , to help with bowel movements.  Narcotics can be constipating.  Be sure to drink plenty of fluids  PAIN MEDICATION USE AND EXPECTATIONS  You have likely been given narcotic medications to help control your pain.  After a traumatic event that results in an fracture (broken bone) with or without surgery, it is ok to use narcotic pain medications to help control one's pain.  We understand that everyone responds to pain differently and each individual patient will be evaluated on a regular basis for the continued need for narcotic medications. Ideally, narcotic medication use should last no more than 6-8 weeks (coinciding with fracture healing).   As a patient it is your responsibility as well to monitor narcotic medication use and report the amount and frequency you use these medications when you come to your office visit.   We would also advise that if you are using narcotic medications, you should take a dose prior to therapy to maximize you participation.  IF YOU ARE ON NARCOTIC MEDICATIONS IT IS NOT PERMISSIBLE TO OPERATE A MOTOR VEHICLE (MOTORCYCLE/CAR/TRUCK/MOPED) OR HEAVY MACHINERY DO NOT MIX NARCOTICS WITH OTHER CNS (CENTRAL NERVOUS SYSTEM) DEPRESSANTS SUCH AS ALCOHOL   POST-OPERATIVE OPIOID TAPER INSTRUCTIONS: It is important to wean off of your opioid medication as soon as possible. If you do not need pain medication after your surgery it is ok to stop day one. Opioids include: Codeine, Hydrocodone (Norco, Vicodin), Oxycodone (Percocet, oxycontin ) and  hydromorphone  amongst others.  Long term and even short term use of opiods can cause: Increased pain response Dependence Constipation Depression Respiratory depression And more.  Withdrawal symptoms can include Flu like symptoms Nausea, vomiting And more Techniques to manage these symptoms Hydrate well Eat regular healthy meals Stay active Use relaxation techniques(deep breathing, meditating, yoga) Do Not substitute Alcohol  to help with tapering If you have been on opioids for less than two weeks and do not have pain than it is ok to stop all together.  Plan to wean off of opioids This plan should start within one week post op of your fracture surgery  Maintain the same interval or time between taking each dose and first decrease the dose.  Cut the total daily intake of opioids by one tablet each day Next start to increase the time between doses. The last dose that should be eliminated is the evening dose.    STOP SMOKING OR USING NICOTINE  PRODUCTS!!!!  As discussed nicotine  severely impairs your body's ability to heal surgical and traumatic wounds but also impairs bone healing.  Wounds and bone heal by forming microscopic blood vessels (angiogenesis) and nicotine  is a vasoconstrictor (essentially, shrinks blood vessels).  Therefore, if vasoconstriction occurs to these microscopic blood vessels they essentially disappear and are unable to deliver necessary nutrients to the healing tissue.  This is one modifiable factor that you can do to dramatically increase your chances of healing your injury.  (This means no smoking, no nicotine  gum, patches, etc)  ICE AND ELEVATE INJURED/OPERATIVE EXTREMITY  Using ice and elevating the injured extremity above your heart can help with swelling and pain control.  Icing in a  pulsatile fashion, such as 20 minutes on and 20 minutes off, can be followed.    Do not place ice directly on skin. Make sure there is a barrier between to skin and the ice pack.     Using frozen items such as frozen peas works well as the conform nicely to the are that needs to be iced.  USE AN ACE WRAP OR TED HOSE FOR SWELLING CONTROL  In addition to icing and elevation, Ace wraps or TED hose are used to help limit and resolve swelling.  It is recommended to use Ace wraps or TED hose until you are informed to stop.    When using Ace Wraps start the wrapping distally (farthest away from the body) and wrap proximally (closer to the body)   Example: If you had surgery on your leg or thing and you do not have a splint on, start the ace wrap at the toes and work your way up to the thigh        If you had surgery on your upper extremity and do not have a splint on, start the ace wrap at your fingers and work your way up to the upper arm   CALL THE OFFICE FOR MEDICATION REFILLS OR WITH ANY QUESTIONS/CONCERNS: [phone]   VISIT OUR WEBSITE FOR ADDITIONAL INFORMATION: [email or web address]    Call office for the following: Temperature greater than 101F Persistent nausea and vomiting Severe uncontrolled pain Redness, tenderness, or signs of infection (pain, swelling, redness, odor or green/yellow discharge around the site) Difficulty breathing, headache or visual disturbances Hives Persistent dizziness or light-headedness Extreme fatigue Any other questions or concerns you may have after discharge  In an emergency, call 911 or go to an Emergency Department at a nearby hospital  OTHER HELPFUL INFORMATION  Do not drink alcoholic beverages or take illicit drugs when taking pain medications.  Pain medication may make you constipated.  Below are a few solutions to try in this order: Decrease the amount of pain medication if you aren't having pain. Drink lots of decaffeinated fluids. Drink prune juice and/or each dried prunes  If the first 3 don't work start with additional solutions Take Colace - an over-the-counter stool softener Take Senokot - an over-the-counter  laxative Take Miralax  - a stronger over-the-counter laxative

## 2023-09-05 ENCOUNTER — Encounter (HOSPITAL_COMMUNITY): Payer: Self-pay | Admitting: Student

## 2023-09-09 ENCOUNTER — Other Ambulatory Visit: Payer: Self-pay

## 2023-09-09 ENCOUNTER — Encounter (HOSPITAL_COMMUNITY): Payer: Self-pay | Admitting: Student

## 2023-09-09 NOTE — Progress Notes (Addendum)
 PCP - none Cardiologist - none  Chest x-ray - 07/11/23 EKG - 07/11/23 Stress Test - n/a ECHO - n/a Cardiac Cath - n/a  ICD Pacemaker/Loop - n/a  Sleep Study -  n/a  Diabetes - n/a  Aspirin  & Blood Thinner Instructions:  n/a  NPO   Anesthesia review: Yes, hx of difficult airway  STOP now taking any Aspirin  (unless otherwise instructed by your surgeon), Aleve , Naproxen , Ibuprofen , Motrin , Advil , Goody's, BC's, all herbal medications, fish oil, and all vitamins.   Coronavirus Screening Do you have any of the following symptoms:  Cough yes/no: No Fever (>100.16F)  yes/no: No Runny nose yes/no: No Sore throat yes/no: No Difficulty breathing/shortness of breath  yes/no: No  Have you traveled in the last 14 days and where? yes/no: No  Patient verbalized understanding of instructions that were given via phone.
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
## 2023-09-11 ENCOUNTER — Encounter (HOSPITAL_COMMUNITY): Payer: Self-pay | Admitting: Student

## 2023-09-11 ENCOUNTER — Other Ambulatory Visit: Payer: Self-pay

## 2023-09-11 ENCOUNTER — Ambulatory Visit (HOSPITAL_COMMUNITY): Payer: MEDICAID | Admitting: Physician Assistant

## 2023-09-11 ENCOUNTER — Encounter (HOSPITAL_COMMUNITY)
Admission: RE | Disposition: A | Discharge status: Home / Self Care | Payer: Self-pay | Source: Home / Self Care | Attending: Student

## 2023-09-11 ENCOUNTER — Ambulatory Visit (HOSPITAL_COMMUNITY)
Admission: RE | Admit: 2023-09-11 | Discharge: 2023-09-11 | Disposition: A | Discharge status: Home / Self Care | Payer: MEDICAID | Attending: Student | Admitting: Student

## 2023-09-11 DIAGNOSIS — Z7901 Long term (current) use of anticoagulants: Secondary | ICD-10-CM | POA: Diagnosis not present

## 2023-09-11 DIAGNOSIS — Z6841 Body Mass Index (BMI) 40.0 and over, adult: Secondary | ICD-10-CM | POA: Diagnosis not present

## 2023-09-11 DIAGNOSIS — T8131XA Disruption of external operation (surgical) wound, not elsewhere classified, initial encounter: Secondary | ICD-10-CM | POA: Diagnosis present

## 2023-09-11 DIAGNOSIS — F17210 Nicotine dependence, cigarettes, uncomplicated: Secondary | ICD-10-CM | POA: Diagnosis not present

## 2023-09-11 DIAGNOSIS — E785 Hyperlipidemia, unspecified: Secondary | ICD-10-CM | POA: Insufficient documentation

## 2023-09-11 DIAGNOSIS — Y92410 Unspecified street and highway as the place of occurrence of the external cause: Secondary | ICD-10-CM | POA: Insufficient documentation

## 2023-09-11 DIAGNOSIS — S71032A Puncture wound without foreign body, left hip, initial encounter: Secondary | ICD-10-CM | POA: Diagnosis not present

## 2023-09-11 DIAGNOSIS — K219 Gastro-esophageal reflux disease without esophagitis: Secondary | ICD-10-CM | POA: Insufficient documentation

## 2023-09-11 HISTORY — PX: APPLICATION OF WOUND VAC: SHX5189

## 2023-09-11 LAB — POCT PREGNANCY, URINE: Preg Test, Ur: NEGATIVE

## 2023-09-11 SURGERY — APPLICATION, WOUND VAC
Anesthesia: General | Site: Hip | Laterality: Left

## 2023-09-11 MED ORDER — MIDAZOLAM HCL 2 MG/2ML IJ SOLN
INTRAMUSCULAR | Status: AC
  Filled 2023-09-11: qty 2

## 2023-09-11 MED ORDER — CHLORHEXIDINE GLUCONATE 0.12 % MT SOLN
15.0000 mL | Freq: Once | OROMUCOSAL | Status: AC
  Administered 2023-09-11: 15 mL via OROMUCOSAL
  Filled 2023-09-11: qty 15

## 2023-09-11 MED ORDER — VANCOMYCIN HCL 1000 MG IV SOLR
INTRAVENOUS | Status: DC | PRN
  Administered 2023-09-11: 1000 mg

## 2023-09-11 MED ORDER — 0.9 % SODIUM CHLORIDE (POUR BTL) OPTIME
TOPICAL | Status: DC | PRN
  Administered 2023-09-11: 1000 mL

## 2023-09-11 MED ORDER — ORAL CARE MOUTH RINSE: 15.0000 mL | Freq: Once | OROMUCOSAL | Status: AC

## 2023-09-11 MED ORDER — DEXAMETHASONE SODIUM PHOSPHATE 10 MG/ML IJ SOLN
INTRAMUSCULAR | Status: DC | PRN
Start: 2023-09-11 — End: 2023-09-11
  Administered 2023-09-11: 10 mg via INTRAVENOUS

## 2023-09-11 MED ORDER — LEVOFLOXACIN 750 MG PO TABS: 750.0000 mg | ORAL_TABLET | Freq: Every day | ORAL | 0 refills | Status: DC

## 2023-09-11 MED ORDER — VANCOMYCIN HCL 1000 MG IV SOLR
INTRAVENOUS | Status: AC
  Filled 2023-09-11: qty 20

## 2023-09-11 MED ORDER — OXYCODONE HCL 5 MG/5ML PO SOLN: 5.0000 mg | Freq: Once | ORAL | Status: DC | PRN

## 2023-09-11 MED ORDER — FENTANYL CITRATE (PF) 250 MCG/5ML IJ SOLN
INTRAMUSCULAR | Status: DC | PRN
  Administered 2023-09-11: 50 ug via INTRAVENOUS
  Administered 2023-09-11 (×2): 100 ug via INTRAVENOUS

## 2023-09-11 MED ORDER — LIDOCAINE 2% (20 MG/ML) 5 ML SYRINGE
INTRAMUSCULAR | Status: DC | PRN
  Administered 2023-09-11: 100 mg via INTRAVENOUS

## 2023-09-11 MED ORDER — FENTANYL CITRATE (PF) 100 MCG/2ML IJ SOLN: 25.0000 ug | INTRAMUSCULAR | Status: DC | PRN

## 2023-09-11 MED ORDER — ONDANSETRON HCL 4 MG/2ML IJ SOLN
INTRAMUSCULAR | Status: DC | PRN
  Administered 2023-09-11: 4 mg via INTRAVENOUS

## 2023-09-11 MED ORDER — DROPERIDOL 2.5 MG/ML IJ SOLN: 0.6250 mg | Freq: Once | INTRAMUSCULAR | Status: DC | PRN

## 2023-09-11 MED ORDER — LACTATED RINGERS IV SOLN: INTRAVENOUS | Status: DC

## 2023-09-11 MED ORDER — OXYCODONE HCL 5 MG PO TABS: 5.0000 mg | ORAL_TABLET | Freq: Once | ORAL | Status: DC | PRN

## 2023-09-11 MED ORDER — PROPOFOL 10 MG/ML IV BOLUS
INTRAVENOUS | Status: AC
  Filled 2023-09-11: qty 20

## 2023-09-11 MED ORDER — ACETAMINOPHEN 10 MG/ML IV SOLN: 1000.0000 mg | Freq: Once | INTRAVENOUS | Status: DC | PRN

## 2023-09-11 MED ORDER — FENTANYL CITRATE (PF) 250 MCG/5ML IJ SOLN
INTRAMUSCULAR | Status: AC
  Filled 2023-09-11: qty 5

## 2023-09-11 MED ORDER — MIDAZOLAM HCL 2 MG/2ML IJ SOLN
INTRAMUSCULAR | Status: DC | PRN
  Administered 2023-09-11: 2 mg via INTRAVENOUS

## 2023-09-11 MED ORDER — PROPOFOL 10 MG/ML IV BOLUS
INTRAVENOUS | Status: DC | PRN
  Administered 2023-09-11: 200 mg via INTRAVENOUS

## 2023-09-11 SURGICAL SUPPLY — 31 items
BAG COUNTER SPONGE SURGICOUNT (BAG) ×1 IMPLANT
BNDG ELASTIC 4X5.8 VLCR STR LF (GAUZE/BANDAGES/DRESSINGS) IMPLANT
BNDG GAUZE DERMACEA FLUFF 4 (GAUZE/BANDAGES/DRESSINGS) IMPLANT
CANISTER SUCTION 3000ML PPV (SUCTIONS) ×1 IMPLANT
CANISTER WOUND CARE 500ML ATS (WOUND CARE) ×1 IMPLANT
COVER SURGICAL LIGHT HANDLE (MISCELLANEOUS) ×1 IMPLANT
DRAPE HALF SHEET 40X57 (DRAPES) IMPLANT
DRAPE INCISE IOBAN 66X45 STRL (DRAPES) IMPLANT
DRAPE SURG 17X23 STRL (DRAPES) IMPLANT
DRAPE SURG ORHT 6 SPLT 77X108 (DRAPES) IMPLANT
DRSG VAC GRANUFOAM LG (GAUZE/BANDAGES/DRESSINGS) IMPLANT
DRSG VAC GRANUFOAM MED (GAUZE/BANDAGES/DRESSINGS) IMPLANT
DRSG VAC GRANUFOAM SM (GAUZE/BANDAGES/DRESSINGS) IMPLANT
ELECTRODE REM PT RTRN 9FT ADLT (ELECTROSURGICAL) ×1 IMPLANT
GAUZE PAD ABD 8X10 STRL (GAUZE/BANDAGES/DRESSINGS) IMPLANT
GAUZE SPONGE 4X4 12PLY STRL (GAUZE/BANDAGES/DRESSINGS) IMPLANT
GAUZE XEROFORM 5X9 LF (GAUZE/BANDAGES/DRESSINGS) ×1 IMPLANT
GLOVE BIO SURGEON STRL SZ 6.5 (GLOVE) ×3 IMPLANT
GLOVE BIO SURGEON STRL SZ7.5 (GLOVE) ×4 IMPLANT
GLOVE BIOGEL PI IND STRL 6.5 (GLOVE) ×1 IMPLANT
GLOVE BIOGEL PI IND STRL 7.5 (GLOVE) ×1 IMPLANT
GOWN STRL REUS W/ TWL LRG LVL3 (GOWN DISPOSABLE) ×2 IMPLANT
KIT BASIN OR (CUSTOM PROCEDURE TRAY) ×1 IMPLANT
KIT TURNOVER KIT B (KITS) ×1 IMPLANT
NS IRRIG 1000ML POUR BTL (IV SOLUTION) ×1 IMPLANT
PACK ORTHO EXTREMITY (CUSTOM PROCEDURE TRAY) ×1 IMPLANT
PAD ARMBOARD POSITIONER FOAM (MISCELLANEOUS) ×1 IMPLANT
TOWEL GREEN STERILE (TOWEL DISPOSABLE) ×1 IMPLANT
TOWEL GREEN STERILE FF (TOWEL DISPOSABLE) ×1 IMPLANT
TUBE CONNECTING 12X1/4 (SUCTIONS) ×1 IMPLANT
YANKAUER SUCT BULB TIP NO VENT (SUCTIONS) ×1 IMPLANT

## 2023-09-11 NOTE — Anesthesia Postprocedure Evaluation (Signed)
 Anesthesia Post Note  Patient: QUANDRA FEDORCHAK  Procedure(s) Performed: APPLICATION, WOUND VAC (Left: Hip)     Patient location during evaluation: PACU Anesthesia Type: General Level of consciousness: awake and alert Pain management: pain level controlled Vital Signs Assessment: post-procedure vital signs reviewed and stable Respiratory status: spontaneous breathing, nonlabored ventilation, respiratory function stable and patient connected to nasal cannula oxygen Cardiovascular status: blood pressure returned to baseline and stable Postop Assessment: no apparent nausea or vomiting Anesthetic complications: no   No notable events documented.  Last Vitals:  Vitals:   09/11/23 0945 09/11/23 0948  BP: 121/70 121/72  Pulse: 99 99  Resp: 16 16  Temp:  36.6 C  SpO2: 100% 100%    Last Pain:  Vitals:   09/11/23 0945  TempSrc:   PainSc: 0-No pain                 Thom JONELLE Peoples

## 2023-09-11 NOTE — Discharge Instructions (Addendum)
 [facility] Discharge Instructions   General Discharge Instructions  WEIGHT BEARING STATUS:weightbearing as tolerated  RANGE OF MOTION/ACTIVITY: unrestricted hip motion  Wound Care: Change wound vac canister as needed  DVT/PE prophylaxis: None  Diet: as you were eating previously.  Can use over the counter stool softeners and bowel preparations, such as Miralax , to help with bowel movements.  Narcotics can be constipating.  Be sure to drink plenty of fluids  IF YOU ARE ON NARCOTIC MEDICATIONS IT IS NOT PERMISSIBLE TO OPERATE A MOTOR VEHICLE (MOTORCYCLE/CAR/TRUCK/MOPED) OR HEAVY MACHINERY DO NOT MIX NARCOTICS WITH OTHER CNS (CENTRAL NERVOUS SYSTEM) DEPRESSANTS SUCH AS ALCOHOL   STOP SMOKING OR USING NICOTINE  PRODUCTS!!!!  As discussed nicotine  severely impairs your body's ability to heal surgical and traumatic wounds but also impairs bone healing.  Wounds and bone heal by forming microscopic blood vessels (angiogenesis) and nicotine  is a vasoconstrictor (essentially, shrinks blood vessels).  Therefore, if vasoconstriction occurs to these microscopic blood vessels they essentially disappear and are unable to deliver necessary nutrients to the healing tissue.  This is one modifiable factor that you can do to dramatically increase your chances of healing your injury.  (This means no smoking, no nicotine  gum, patches, etc)  ICE AND ELEVATE INJURED/OPERATIVE EXTREMITY  Using ice and elevating the injured extremity above your heart can help with swelling and pain control.  Icing in a pulsatile fashion, such as 20 minutes on and 20 minutes off, can be followed.    Do not place ice directly on skin. Make sure there is a barrier between to skin and the ice pack.    Using frozen items such as frozen peas works well as the conform nicely to the are that needs to be iced.  USE AN ACE WRAP OR TED HOSE FOR SWELLING CONTROL  In addition to icing and elevation, Ace wraps or TED hose are  used to help limit and resolve swelling.  It is recommended to use Ace wraps or TED hose until you are informed to stop.    When using Ace Wraps start the wrapping distally (farthest away from the body) and wrap proximally (closer to the body)   Example: If you had surgery on your leg or thing and you do not have a splint on, start the ace wrap at the toes and work your way up to the thigh        If you had surgery on your upper extremity and do not have a splint on, start the ace wrap at your fingers and work your way up to the upper arm  CALL THE OFFICE FOR MEDICATION REFILLS OR WITH ANY QUESTIONS/CONCERNS: [phone]   VISIT OUR WEBSITE FOR ADDITIONAL INFORMATION: [email or web address]  Call office for the following: Temperature greater than 101F Persistent nausea and vomiting Severe uncontrolled pain Redness, tenderness, or signs of infection (pain, swelling, redness, odor or green/yellow discharge around the site) Difficulty breathing, headache or visual disturbances Hives Persistent dizziness or light-headedness Extreme fatigue Any other questions or concerns you may have after discharge  In an emergency, call 911 or go to an Emergency Department at a nearby hospital  OTHER HELPFUL INFORMATION  Pain medication may make you constipated.  Below are a few solutions to try in this order: Decrease the amount of pain medication if you aren't having pain. Drink lots of decaffeinated fluids. Drink prune juice and/or each dried prunes  If the first 3 don't work start with additional solutions Take Colace - an over-the-counter stool softener  Take Senokot - an over-the-counter laxative Take Miralax  - a stronger over-the-counter laxative

## 2023-09-11 NOTE — Op Note (Signed)
 Orthopaedic Surgery Operative Note (CSN: [id number] ) Date of Surgery: 09/11/2023  Admit Date: 09/11/2023   Diagnoses: Pre-Op Diagnoses: Left hip wound  Post-Op Diagnosis: Same  Procedures: CPT 26990-Irrigation and debridement left hip wound/infection CPT 97605-Wound vac placement  Surgeons : Primary: Kendal Franky SQUIBB, MD  Assistant: [name], PA-C  Location: OR 1   Anesthesia: General   Antibiotics: 1 gm vancomycin  powder placed topically   Tourniquet time: None  Estimated Blood Loss: Minimal  Complications:* No complications entered in OR log *   Specimens: ID Type Source Tests Collected by Time Destination  A :  Body Fluid Wound AEROBIC/ANAEROBIC CULTURE W GRAM STAIN (SURGICAL/DEEP WOUND) Kendal Franky SQUIBB, MD 09/11/2023 [phone]      Implants: * No implants in log *   Indications for Surgery: 28 year old female who had a left hip wound that we have been serial we taking to the operating room for debridements as well as wound VAC changes.  Last 1 was last week.  She developed some increased drainage with discoloration and a foul smell earlier this week.  She presents for a wound VAC change risks and benefits were discussed.  She agrees to proceed with surgery and consent was obtained.  Operative Findings: 1.  Foul-smelling odor and purulent material around the wound VAC sponge that was removed.  Culture sent for micro 2.  Irrigation and debridement of wound which after debridement appeared viable and healthy without any signs of necrotic tissue 3.  Wound VAC placement to the left hip.  Procedure: The patient was identified in the preoperative holding area. Consent was confirmed with the patient and their family and all questions were answered. The operative extremity was marked after confirmation with the patient. she was then brought back to the operating room by our anesthesia colleagues.  Carefully transferred over to a regular OR table.  She was placed under general  anesthetic.  The left hip was then prepped and draped in usual sterile fashion.  A timeout was performed to verify the patient, the procedure, and the extremity.  I remove the previous sponge.  There was some purulent fluid that I expressed out of the wound that I sent for culture.  There is no foreign bodies that were present the soft tissue.  Be healthy without any necrosis.  I then used a Cobb elevator to debride the periphery of the wound.  The measurements are noted below.  I then used low-pressure pulsatile lavage to thoroughly irrigate the wound with 3 L of normal saline.  I then placed a gram of vancomycin  powder into the wound.  A blue granular foam sponge was then placed.  It was connected to 125 mmHg.  The patient was then awoke from anesthesia and taken to the PACU in stable condition.   Debridement type: Excisional Debridement  Side: left  Body Location: Hip  Tools used for debridement: Cobb elevator  Pre-debridement Wound size (cm):   Length: 3.5        Width: 1.5     Depth: 3.5cm anterior, 4 cm posterior, 6 cm central   Post-debridement Wound size (cm):   Same  Debridement depth beyond dead/damaged tissue down to healthy viable tissue: yes  Tissue layer involved: skin, subcutaneous tissue, muscle / fascia  Nature of tissue removed: Purulence  Irrigation volume: 3L     Irrigation fluid type: Normal Saline   Post Op Plan/Instructions: Patient be weightbearing as tolerated will start oral antibiotics and follow-up cultures and transition those as needed.  Will plan to return to the operating room next week for repeat debridement with wound VAC change.  I was present and performed the entire surgery.  [name], PA-C did assist me throughout the case. An assistant was necessary given the difficulty in approach, maintenance of reduction and ability to instrument the fracture.   Franky Light, MD [facility]

## 2023-09-11 NOTE — Transfer of Care (Signed)
 Immediate Anesthesia Transfer of Care Note  Patient: [name]  Procedure(s) Performed: APPLICATION, WOUND VAC (Left: Hip)  Patient Location: PACU  Anesthesia Type:General  Level of Consciousness: drowsy  Airway & Oxygen Therapy: Patient Spontanous Breathing and Patient connected to face mask oxygen  Post-op Assessment: Report given to RN and Post -op Vital signs reviewed and stable  Post vital signs: Reviewed and stable  Last Vitals:  Vitals Value Taken Time  BP 116/65 09/11/23 09:18  Temp 36.7 C 09/11/23 09:18  Pulse 104 09/11/23 09:21  Resp 17 09/11/23 09:21  SpO2 100 % 09/11/23 09:21  Vitals shown include unfiled device data.  Last Pain:  Vitals:   09/11/23 0742  TempSrc:   PainSc: 0-No pain         Complications: No notable events documented.

## 2023-09-11 NOTE — Anesthesia Procedure Notes (Signed)
 Procedure Name: LMA Insertion Date/Time: 09/11/2023 8:43 AM  Performed by: Mannie Krystal LABOR, CRNAPre-anesthesia Checklist: Patient identified, Emergency Drugs available, Suction available and Patient being monitored Patient Re-evaluated:Patient Re-evaluated prior to induction Oxygen Delivery Method: Circle system utilized Preoxygenation: Pre-oxygenation with 100% oxygen Induction Type: IV induction LMA: LMA with gastric port inserted LMA Size: 4.0 Number of attempts: 1 Placement Confirmation: positive ETCO2 and breath sounds checked- equal and bilateral Tube secured with: Tape Dental Injury: Teeth and Oropharynx as per pre-operative assessment

## 2023-09-11 NOTE — Interval H&P Note (Signed)
 History and Physical Interval Note:  09/11/2023 7:44 AM  [name]  has presented today for surgery, with the diagnosis of Left hip wound.  The various methods of treatment have been discussed with the patient and family. After consideration of risks, benefits and other options for treatment, the patient has consented to  Procedure(s): APPLICATION, WOUND VAC (Left) as a surgical intervention.  The patient's history has been reviewed, patient examined, no change in status, stable for surgery.  I have reviewed the patient's chart and labs.  Questions were answered to the patient's satisfaction.     Jonea Bukowski P Shekita Boyden

## 2023-09-12 ENCOUNTER — Encounter (HOSPITAL_COMMUNITY): Payer: Self-pay | Admitting: Student

## 2023-09-14 ENCOUNTER — Ambulatory Visit: Payer: Self-pay | Admitting: Student

## 2023-09-16 ENCOUNTER — Encounter (HOSPITAL_COMMUNITY): Payer: Self-pay | Admitting: Student

## 2023-09-16 ENCOUNTER — Other Ambulatory Visit: Payer: Self-pay

## 2023-09-16 LAB — AEROBIC/ANAEROBIC CULTURE W GRAM STAIN (SURGICAL/DEEP WOUND)

## 2023-09-16 NOTE — Progress Notes (Signed)
 PCP - none Cardiologist - none  Chest x-ray - 07/11/23 EKG - 07/11/23 Stress Test - n/a ECHO - n/a Cardiac Cath - n/a  ICD Pacemaker/Loop - n/a  Sleep Study -  n/a  Diabetes - n/a  Aspirin  & Blood Thinner Instructions:  n/a  Suboxone  - Check with prescriber for Suboxone  instructions prior to surgery.  NPO   Anesthesia review: Yes, hx of difficult airway.  [name], [name] wrote a note on 09/09/23.  [name] to update if needed.  STOP now taking any Aspirin  (unless otherwise instructed by your surgeon), Aleve , Naproxen , Ibuprofen , Motrin , Advil , Goody's, BC's, all herbal medications, fish oil, and all vitamins.   Coronavirus Screening Do you have any of the following symptoms:  Cough yes/no: No Fever (>100.40F)  yes/no: No Runny nose yes/no: No Sore throat yes/no: No Difficulty breathing/shortness of breath  yes/no: No  Have you traveled in the last 14 days and where? yes/no: No  Patient verbalized understanding of instructions that were given via phone.
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
## 2023-09-18 ENCOUNTER — Ambulatory Visit (HOSPITAL_COMMUNITY)
Admission: RE | Admit: 2023-09-18 | Discharge: 2023-09-18 | Disposition: A | Discharge status: Home / Self Care | Payer: MEDICAID | Attending: Student | Admitting: Student

## 2023-09-18 ENCOUNTER — Encounter (HOSPITAL_COMMUNITY): Payer: Self-pay | Admitting: Student

## 2023-09-18 ENCOUNTER — Ambulatory Visit (HOSPITAL_COMMUNITY): Payer: MEDICAID | Admitting: Vascular Surgery

## 2023-09-18 ENCOUNTER — Encounter (HOSPITAL_COMMUNITY)
Admission: RE | Disposition: A | Discharge status: Home / Self Care | Payer: Self-pay | Source: Home / Self Care | Attending: Student

## 2023-09-18 ENCOUNTER — Ambulatory Visit: Payer: Self-pay | Admitting: Student

## 2023-09-18 ENCOUNTER — Other Ambulatory Visit: Payer: Self-pay

## 2023-09-18 ENCOUNTER — Ambulatory Visit (HOSPITAL_BASED_OUTPATIENT_CLINIC_OR_DEPARTMENT_OTHER): Payer: MEDICAID | Admitting: Vascular Surgery

## 2023-09-18 DIAGNOSIS — T8133XD Disruption of traumatic injury wound repair, subsequent encounter: Secondary | ICD-10-CM | POA: Insufficient documentation

## 2023-09-18 DIAGNOSIS — F418 Other specified anxiety disorders: Secondary | ICD-10-CM | POA: Diagnosis not present

## 2023-09-18 DIAGNOSIS — E66813 Obesity, class 3: Secondary | ICD-10-CM

## 2023-09-18 DIAGNOSIS — S71032D Puncture wound without foreign body, left hip, subsequent encounter: Secondary | ICD-10-CM

## 2023-09-18 DIAGNOSIS — Z6841 Body Mass Index (BMI) 40.0 and over, adult: Secondary | ICD-10-CM

## 2023-09-18 DIAGNOSIS — F17210 Nicotine dependence, cigarettes, uncomplicated: Secondary | ICD-10-CM | POA: Insufficient documentation

## 2023-09-18 DIAGNOSIS — T8130XA Disruption of wound, unspecified, initial encounter: Secondary | ICD-10-CM | POA: Diagnosis present

## 2023-09-18 HISTORY — PX: APPLICATION OF WOUND VAC: SHX5189

## 2023-09-18 LAB — POCT PREGNANCY, URINE: Preg Test, Ur: NEGATIVE

## 2023-09-18 SURGERY — APPLICATION, WOUND VAC
Anesthesia: General | Laterality: Left

## 2023-09-18 MED ORDER — DEXAMETHASONE SODIUM PHOSPHATE 10 MG/ML IJ SOLN
INTRAMUSCULAR | Status: DC | PRN
  Administered 2023-09-18: 10 mg via INTRAVENOUS

## 2023-09-18 MED ORDER — VANCOMYCIN HCL 1000 MG IV SOLR
INTRAVENOUS | Status: AC
  Filled 2023-09-18: qty 20

## 2023-09-18 MED ORDER — PHENYLEPHRINE 80 MCG/ML (10ML) SYRINGE FOR IV PUSH (FOR BLOOD PRESSURE SUPPORT)
PREFILLED_SYRINGE | INTRAVENOUS | Status: DC | PRN
  Administered 2023-09-18: 80 ug via INTRAVENOUS

## 2023-09-18 MED ORDER — PROPOFOL 10 MG/ML IV BOLUS
INTRAVENOUS | Status: AC
  Filled 2023-09-18: qty 20

## 2023-09-18 MED ORDER — SODIUM CHLORIDE 0.9 % IV SOLN: 12.5000 mg | INTRAVENOUS | Status: DC | PRN

## 2023-09-18 MED ORDER — LACTATED RINGERS IV SOLN: INTRAVENOUS | Status: DC

## 2023-09-18 MED ORDER — 0.9 % SODIUM CHLORIDE (POUR BTL) OPTIME
TOPICAL | Status: DC | PRN
  Administered 2023-09-18: 1000 mL

## 2023-09-18 MED ORDER — MIDAZOLAM HCL 2 MG/2ML IJ SOLN
INTRAMUSCULAR | Status: DC | PRN
  Administered 2023-09-18: 2 mg via INTRAVENOUS

## 2023-09-18 MED ORDER — ORAL CARE MOUTH RINSE: 15.0000 mL | Freq: Once | OROMUCOSAL | Status: AC

## 2023-09-18 MED ORDER — LEVOFLOXACIN 750 MG PO TABS: 750.0000 mg | ORAL_TABLET | Freq: Every day | ORAL | 0 refills | Status: DC

## 2023-09-18 MED ORDER — MIDAZOLAM HCL 2 MG/2ML IJ SOLN
INTRAMUSCULAR | Status: AC
  Filled 2023-09-18: qty 2

## 2023-09-18 MED ORDER — LIDOCAINE 2% (20 MG/ML) 5 ML SYRINGE
INTRAMUSCULAR | Status: DC | PRN
  Administered 2023-09-18: 60 mg via INTRAVENOUS

## 2023-09-18 MED ORDER — ONDANSETRON HCL 4 MG/2ML IJ SOLN
INTRAMUSCULAR | Status: DC | PRN
  Administered 2023-09-18: 4 mg via INTRAVENOUS

## 2023-09-18 MED ORDER — PROPOFOL 10 MG/ML IV BOLUS
INTRAVENOUS | Status: DC | PRN
  Administered 2023-09-18: 200 mg via INTRAVENOUS

## 2023-09-18 MED ORDER — MEPERIDINE HCL 25 MG/ML IJ SOLN: 6.2500 mg | INTRAMUSCULAR | Status: DC | PRN

## 2023-09-18 MED ORDER — VANCOMYCIN HCL 1000 MG IV SOLR
INTRAVENOUS | Status: DC | PRN
  Administered 2023-09-18: 1000 mg

## 2023-09-18 MED ORDER — OXYCODONE HCL 5 MG/5ML PO SOLN: 5.0000 mg | Freq: Once | ORAL | Status: DC | PRN

## 2023-09-18 MED ORDER — OXYCODONE HCL 5 MG PO TABS: 5.0000 mg | ORAL_TABLET | Freq: Once | ORAL | Status: DC | PRN

## 2023-09-18 MED ORDER — SODIUM CHLORIDE 0.9 % IR SOLN
Status: DC | PRN
  Administered 2023-09-18: 3000 mL

## 2023-09-18 MED ORDER — CHLORHEXIDINE GLUCONATE 0.12 % MT SOLN
15.0000 mL | Freq: Once | OROMUCOSAL | Status: AC
  Administered 2023-09-18: 15 mL via OROMUCOSAL
  Filled 2023-09-18: qty 15

## 2023-09-18 MED ORDER — HYDROMORPHONE HCL 1 MG/ML IJ SOLN: 0.2500 mg | INTRAMUSCULAR | Status: DC | PRN

## 2023-09-18 MED ORDER — AMISULPRIDE (ANTIEMETIC) 5 MG/2ML IV SOLN: 10.0000 mg | Freq: Once | INTRAVENOUS | Status: DC | PRN

## 2023-09-18 MED ORDER — CEFAZOLIN SODIUM-DEXTROSE 2-4 GM/100ML-% IV SOLN
2.0000 g | INTRAVENOUS | Status: AC
  Administered 2023-09-18: 2 g via INTRAVENOUS
  Filled 2023-09-18: qty 100

## 2023-09-18 SURGICAL SUPPLY — 33 items
BAG COUNTER SPONGE SURGICOUNT (BAG) ×1 IMPLANT
BNDG ELASTIC 4X5.8 VLCR STR LF (GAUZE/BANDAGES/DRESSINGS) IMPLANT
BNDG GAUZE DERMACEA FLUFF 4 (GAUZE/BANDAGES/DRESSINGS) IMPLANT
CANISTER SUCTION 3000ML PPV (SUCTIONS) ×1 IMPLANT
CANISTER WOUND CARE 500ML ATS (WOUND CARE) ×1 IMPLANT
COVER SURGICAL LIGHT HANDLE (MISCELLANEOUS) ×1 IMPLANT
DRAPE HALF SHEET 40X57 (DRAPES) IMPLANT
DRAPE INCISE IOBAN 66X45 STRL (DRAPES) IMPLANT
DRAPE SURG 17X23 STRL (DRAPES) IMPLANT
DRAPE SURG ORHT 6 SPLT 77X108 (DRAPES) IMPLANT
DRESSING VERAFLO CLEANS CC MED (GAUZE/BANDAGES/DRESSINGS) IMPLANT
DRSG VAC GRANUFOAM LG (GAUZE/BANDAGES/DRESSINGS) IMPLANT
DRSG VAC GRANUFOAM MED (GAUZE/BANDAGES/DRESSINGS) IMPLANT
DRSG VAC GRANUFOAM SM (GAUZE/BANDAGES/DRESSINGS) IMPLANT
ELECTRODE REM PT RTRN 9FT ADLT (ELECTROSURGICAL) ×1 IMPLANT
GAUZE PAD ABD 8X10 STRL (GAUZE/BANDAGES/DRESSINGS) IMPLANT
GAUZE SPONGE 4X4 12PLY STRL (GAUZE/BANDAGES/DRESSINGS) IMPLANT
GAUZE XEROFORM 5X9 LF (GAUZE/BANDAGES/DRESSINGS) ×1 IMPLANT
GLOVE BIO SURGEON STRL SZ 6.5 (GLOVE) ×3 IMPLANT
GLOVE BIO SURGEON STRL SZ7.5 (GLOVE) ×4 IMPLANT
GLOVE BIOGEL PI IND STRL 6.5 (GLOVE) ×1 IMPLANT
GLOVE BIOGEL PI IND STRL 7.5 (GLOVE) ×1 IMPLANT
GOWN STRL REUS W/ TWL LRG LVL3 (GOWN DISPOSABLE) ×2 IMPLANT
KIT BASIN OR (CUSTOM PROCEDURE TRAY) ×1 IMPLANT
KIT TURNOVER KIT B (KITS) ×1 IMPLANT
NS IRRIG 1000ML POUR BTL (IV SOLUTION) ×1 IMPLANT
PACK ORTHO EXTREMITY (CUSTOM PROCEDURE TRAY) ×1 IMPLANT
PAD ARMBOARD POSITIONER FOAM (MISCELLANEOUS) ×1 IMPLANT
PAD NEG PRESSURE SENSATRAC (MISCELLANEOUS) IMPLANT
TOWEL GREEN STERILE (TOWEL DISPOSABLE) ×1 IMPLANT
TOWEL GREEN STERILE FF (TOWEL DISPOSABLE) ×1 IMPLANT
TUBE CONNECTING 12X1/4 (SUCTIONS) ×1 IMPLANT
YANKAUER SUCT BULB TIP NO VENT (SUCTIONS) ×1 IMPLANT

## 2023-09-18 NOTE — Transfer of Care (Signed)
 Immediate Anesthesia Transfer of Care Note  Patient: [name]  Procedure(s) Performed: APPLICATION, WOUND VAC HIP (Left)  Patient Location: PACU  Anesthesia Type:General  Level of Consciousness: sedated  Airway & Oxygen Therapy: Patient Spontanous Breathing  Post-op Assessment: Report given to RN and Post -op Vital signs reviewed and stable  Post vital signs: Reviewed and stable  Last Vitals:  Vitals Value Taken Time  BP 104/51 09/18/23 09:34  Temp    Pulse 97 09/18/23 09:36  Resp 15 09/18/23 09:36  SpO2 98 % 09/18/23 09:36  Vitals shown include unfiled device data.  Last Pain:  Vitals:   09/18/23 0710  TempSrc:   PainSc: 2          Complications: No notable events documented.

## 2023-09-18 NOTE — Discharge Instructions (Signed)
 [facility] Discharge Instructions   General Discharge Instructions  WEIGHT BEARING STATUS: Weightbearing as tolerated  RANGE OF MOTION/ACTIVITY: Unrestricted motion right hip  Wound Care: Maintain dressing to the right hip.  Change wound VAC canister as needed.  Okay to gently clean around your dressing with soap and water .  Would avoid fully submerging yourself in water .  Would recommend sponge/bed bath while dressing is in place.   DVT/PE prophylaxis: None  Diet: as you were eating previously.  Can use over the counter stool softeners and bowel preparations, such as Miralax , to help with bowel movements.  Narcotics can be constipating.  Be sure to drink plenty of fluids  PAIN MEDICATION USE AND EXPECTATIONS  You have likely been given narcotic medications to help control your pain.  After a traumatic event that results in an fracture (broken bone) with or without surgery, it is ok to use narcotic pain medications to help control one's pain.  We understand that everyone responds to pain differently and each individual patient will be evaluated on a regular basis for the continued need for narcotic medications. Ideally, narcotic medication use should last no more than 6-8 weeks (coinciding with fracture healing).   As a patient it is your responsibility as well to monitor narcotic medication use and report the amount and frequency you use these medications when you come to your office visit.   We would also advise that if you are using narcotic medications, you should take a dose prior to therapy to maximize you participation.  IF YOU ARE ON NARCOTIC MEDICATIONS IT IS NOT PERMISSIBLE TO OPERATE A MOTOR VEHICLE (MOTORCYCLE/CAR/TRUCK/MOPED) OR HEAVY MACHINERY DO NOT MIX NARCOTICS WITH OTHER CNS (CENTRAL NERVOUS SYSTEM) DEPRESSANTS SUCH AS ALCOHOL   POST-OPERATIVE OPIOID TAPER INSTRUCTIONS: It is important to wean off of your opioid medication as soon as possible. If you do  not need pain medication after your surgery it is ok to stop day one. Opioids include: Codeine, Hydrocodone (Norco, Vicodin), Oxycodone (Percocet, oxycontin ) and hydromorphone  amongst others.  Long term and even short term use of opiods can cause: Increased pain response Dependence Constipation Depression Respiratory depression And more.  Withdrawal symptoms can include Flu like symptoms Nausea, vomiting And more Techniques to manage these symptoms Hydrate well Eat regular healthy meals Stay active Use relaxation techniques(deep breathing, meditating, yoga) Do Not substitute Alcohol  to help with tapering If you have been on opioids for less than two weeks and do not have pain than it is ok to stop all together.  Plan to wean off of opioids This plan should start within one week post op of your fracture surgery  Maintain the same interval or time between taking each dose and first decrease the dose.  Cut the total daily intake of opioids by one tablet each day Next start to increase the time between doses. The last dose that should be eliminated is the evening dose.    STOP SMOKING OR USING NICOTINE  PRODUCTS!!!!  As discussed nicotine  severely impairs your body's ability to heal surgical and traumatic wounds but also impairs bone healing.  Wounds and bone heal by forming microscopic blood vessels (angiogenesis) and nicotine  is a vasoconstrictor (essentially, shrinks blood vessels).  Therefore, if vasoconstriction occurs to these microscopic blood vessels they essentially disappear and are unable to deliver necessary nutrients to the healing tissue.  This is one modifiable factor that you can do to dramatically increase your chances of healing your injury.  (This means no smoking, no nicotine  gum, patches,  etc)   ICE AND ELEVATE INJURED/OPERATIVE EXTREMITY  Using ice and elevating the injured extremity above your heart can help with swelling and pain control.  Icing in a pulsatile  fashion, such as 20 minutes on and 20 minutes off, can be followed.    Do not place ice directly on skin. Make sure there is a barrier between to skin and the ice pack.    Using frozen items such as frozen peas works well as the conform nicely to the are that needs to be iced.  USE AN ACE WRAP OR TED HOSE FOR SWELLING CONTROL  In addition to icing and elevation, Ace wraps or TED hose are used to help limit and resolve swelling.  It is recommended to use Ace wraps or TED hose until you are informed to stop.    When using Ace Wraps start the wrapping distally (farthest away from the body) and wrap proximally (closer to the body)   Example: If you had surgery on your leg or thing and you do not have a splint on, start the ace wrap at the toes and work your way up to the thigh        If you had surgery on your upper extremity and do not have a splint on, start the ace wrap at your fingers and work your way up to the upper arm  CALL THE OFFICE FOR MEDICATION REFILLS OR WITH ANY QUESTIONS/CONCERNS: [phone]   VISIT OUR WEBSITE FOR ADDITIONAL INFORMATION: [email or web address]    Call office for the following: Temperature greater than 101F Persistent nausea and vomiting Severe uncontrolled pain Redness, tenderness, or signs of infection (pain, swelling, redness, odor or green/yellow discharge around the site) Difficulty breathing, headache or visual disturbances Hives Persistent dizziness or light-headedness Extreme fatigue Any other questions or concerns you may have after discharge  In an emergency, call 911 or go to an Emergency Department at a nearby hospital  OTHER HELPFUL INFORMATION  You should wean off your narcotic medicines as soon as you are able.  Most patients will be off or using minimal narcotics before their first postop appointment.    Do not drink alcoholic beverages or take illicit drugs when taking pain medications.  In most states it is against the law to drive while  you are in a splint or sling.  And certainly against the law to drive while taking narcotics.   Pain medication may make you constipated.  Below are a few solutions to try in this order: Decrease the amount of pain medication if you aren't having pain. Drink lots of decaffeinated fluids. Drink prune juice and/or each dried prunes  If the first 3 don't work start with additional solutions Take Colace - an over-the-counter stool softener Take Senokot - an over-the-counter laxative Take Miralax  - a stronger over-the-counter laxative

## 2023-09-18 NOTE — Op Note (Signed)
 Orthopaedic Surgery Operative Note (CSN: [id number] ) Date of Surgery: 09/18/2023  Admit Date: 09/18/2023   Diagnoses: Pre-Op Diagnoses: Left hip wound  Post-Op Diagnosis: Same  Procedures: CPT 97605-Wound vac change left hip  Surgeons : Primary: Kendal Franky SQUIBB, MD  Assistant: [name], PA-C  Location: OR 3   Anesthesia:General   Antibiotics: Ancef  2g preop with 1 gm vancomycin  powder placed topically   Tourniquet time: None    Estimated Blood Loss: Minimal  Complications:* No complications entered in OR log *   Specimens:* No specimens in log *   Implants: * No implants in log *   Indications for Surgery: 28 year old female who has a left hip wound that has been undergoing serial debridements and wound VAC changes.  Will plan to return to the operating room for wound VAC change today and reassessment of the wound.  Risks and benefits were discussed with her and her mother.  She agreed to proceed with surgery and consent was obtained.  Operative Findings: 1.  Much improved appearance of the wound with no purulent drainage.  Wound bed appeared viable and healthy wound VAC changed with decrease in size.  Procedure: The patient was identified in the preoperative holding area. Consent was confirmed with the patient and their family and all questions were answered. The operative extremity was marked after confirmation with the patient. she was then brought back to the operating room by our anesthesia colleagues.  She placed under general anesthetic carefully transferred over to a regular or table.  The left hip was prepped and draped in usual sterile fashion.  Timeout was performed to verify the patient, the procedure, and the extremity.  Preoperative antibiotics were dosed.  The wound VAC sponge was removed.  There was some serosanguineous drainage but was not purulent did not have an odor to it.  The soft tissue within the wound bed was viable healthy and beefy red.  No  necrotic tissue.  Debrided the wound with a Cobb elevator.  I irrigated it with normal saline.  I then replaced the blue granular foam sponge.  Connected to 225 mmHg.  Good seal was obtained.  The patient was then awoke from anesthesia and taken to the PACU in stable condition.   Debridement type: Excisional Debridement  Side: left  Body Location: hip   Tools used for debridement: rongeur  Pre-debridement Wound size (cm):   Length: 3.5        Width: 1.5     Depth: 2cm anterior, 3 posterior, with 5 cm central   Post-debridement Wound size (cm):   Same  Debridement depth beyond dead/damaged tissue down to healthy viable tissue: yes  Tissue layer involved: skin, subcutaneous tissue  Nature of tissue removed: Slough  Irrigation volume: 1L     Irrigation fluid type: Normal Saline   Post Op Plan/Instructions: Patient be weightbearing as tolerated to the lower extremity.  Will return in 1 week for another wound VAC change.  Continue with antibiotics until then.  I was present and performed the entire surgery.  [name], PA-C did assist me throughout the case. An assistant was necessary given the difficulty in approach, maintenance of reduction and ability to instrument the fracture.   Franky Kendal, MD [facility]

## 2023-09-18 NOTE — Anesthesia Procedure Notes (Signed)
 Procedure Name: LMA Insertion Date/Time: 09/18/2023 8:56 AM  Performed by: Marva Lonni PARAS, CRNAPre-anesthesia Checklist: Patient identified, Emergency Drugs available, Suction available and Patient being monitored Patient Re-evaluated:Patient Re-evaluated prior to induction Oxygen Delivery Method: Circle System Utilized Preoxygenation: Pre-oxygenation with 100% oxygen Induction Type: IV induction Ventilation: Mask ventilation without difficulty LMA: LMA inserted LMA Size: 4.0 Number of attempts: 1 Airway Equipment and Method: Bite block Placement Confirmation: positive ETCO2 Tube secured with: Tape Dental Injury: Teeth and Oropharynx as per pre-operative assessment

## 2023-09-18 NOTE — Anesthesia Postprocedure Evaluation (Signed)
 Anesthesia Post Note  Patient: [name]  Procedure(s) Performed: APPLICATION, WOUND VAC HIP (Left)     Patient location during evaluation: PACU Anesthesia Type: General Level of consciousness: awake and alert Pain management: pain level controlled Vital Signs Assessment: post-procedure vital signs reviewed and stable Respiratory status: spontaneous breathing, nonlabored ventilation and respiratory function stable Cardiovascular status: blood pressure returned to baseline and stable Postop Assessment: no apparent nausea or vomiting Anesthetic complications: no   No notable events documented.  Last Vitals:  Vitals:   09/18/23 1000 09/18/23 1015  BP: 93/75 (!) 95/58  Pulse: (!) 102 100  Resp: 15   Temp:    SpO2: 99% 99%    Last Pain:  Vitals:   09/18/23 1000  TempSrc:   PainSc: 0-No pain                 Butler Levander Pinal

## 2023-09-18 NOTE — Interval H&P Note (Signed)
 History and Physical Interval Note:  09/18/2023 8:23 AM  [name] [place]  has presented today for surgery, with the diagnosis of Left hip wound/infection.  The various methods of treatment have been discussed with the patient and family. After consideration of risks, benefits and other options for treatment, the patient has consented to  Procedure(s): APPLICATION, WOUND VAC HIP (Left) as a surgical intervention.  The patient's history has been reviewed, patient examined, no change in status, stable for surgery.  I have reviewed the patient's chart and labs.  Questions were answered to the patient's satisfaction.     [name] P Taji Barretto

## 2023-09-21 ENCOUNTER — Encounter (HOSPITAL_COMMUNITY): Payer: Self-pay | Admitting: Student

## 2023-09-23 NOTE — Progress Notes (Signed)
 Spoke with Melissa at Dr. Thyra office to verify phone numbers.  Number listed states cannot be completed as dialed. Grandmother's VM is full and Aunt does not have VM set up.
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
## 2023-09-24 ENCOUNTER — Encounter (HOSPITAL_COMMUNITY): Payer: Self-pay | Admitting: Student

## 2023-09-24 NOTE — Progress Notes (Signed)
 SDW CALL  Patient was given pre-op instructions over the phone. The opportunity was given for the patient to ask questions. No further questions asked. Patient verbalized understanding of instructions given.   PCP - denies Cardiologist -denies   PPM/ICD - denies   Chest x-ray - 07/11/23 EKG - 07/13/23 Stress Test - denies ECHO - denies Cardiac Cath - denies  Sleep Study - denies  No DM  Last dose of GLP1 agonist-  n/a GLP1 instructions:  n/a  Blood Thinner Instructions: n/a Aspirin  Instructions: n/a  ERAS Protcol - NPO PRE-SURGERY Ensure or G2- n/a  COVID TEST- n/a   Anesthesia review: yes  Patient denies shortness of breath, fever, cough and chest pain over the phone call   All instructions explained to the patient, with a verbal understanding of the material. Patient agrees to go over the instructions while at home for a better understanding.   Patient reports no changes since her last visit.

## 2023-09-25 ENCOUNTER — Ambulatory Visit (HOSPITAL_BASED_OUTPATIENT_CLINIC_OR_DEPARTMENT_OTHER): Payer: MEDICAID | Admitting: Vascular Surgery

## 2023-09-25 ENCOUNTER — Ambulatory Visit (HOSPITAL_COMMUNITY): Payer: MEDICAID | Admitting: Vascular Surgery

## 2023-09-25 ENCOUNTER — Encounter (HOSPITAL_COMMUNITY)
Admission: RE | Disposition: A | Discharge status: Home / Self Care | Payer: Self-pay | Source: Home / Self Care | Attending: Student

## 2023-09-25 ENCOUNTER — Other Ambulatory Visit: Payer: Self-pay

## 2023-09-25 ENCOUNTER — Encounter (HOSPITAL_COMMUNITY): Payer: Self-pay | Admitting: Student

## 2023-09-25 ENCOUNTER — Ambulatory Visit: Payer: Self-pay | Admitting: Student

## 2023-09-25 ENCOUNTER — Ambulatory Visit (HOSPITAL_COMMUNITY)
Admission: RE | Admit: 2023-09-25 | Discharge: 2023-09-25 | Disposition: A | Discharge status: Home / Self Care | Payer: MEDICAID | Attending: Student | Admitting: Student

## 2023-09-25 DIAGNOSIS — K219 Gastro-esophageal reflux disease without esophagitis: Secondary | ICD-10-CM | POA: Diagnosis not present

## 2023-09-25 DIAGNOSIS — F17200 Nicotine dependence, unspecified, uncomplicated: Secondary | ICD-10-CM | POA: Insufficient documentation

## 2023-09-25 DIAGNOSIS — F418 Other specified anxiety disorders: Secondary | ICD-10-CM | POA: Diagnosis not present

## 2023-09-25 DIAGNOSIS — S71002A Unspecified open wound, left hip, initial encounter: Secondary | ICD-10-CM | POA: Diagnosis present

## 2023-09-25 DIAGNOSIS — Z6841 Body Mass Index (BMI) 40.0 and over, adult: Secondary | ICD-10-CM | POA: Insufficient documentation

## 2023-09-25 DIAGNOSIS — T8189XD Other complications of procedures, not elsewhere classified, subsequent encounter: Secondary | ICD-10-CM | POA: Diagnosis not present

## 2023-09-25 DIAGNOSIS — Y838 Other surgical procedures as the cause of abnormal reaction of the patient, or of later complication, without mention of misadventure at the time of the procedure: Secondary | ICD-10-CM | POA: Insufficient documentation

## 2023-09-25 HISTORY — PX: INCISION AND DRAINAGE HIP: SHX1801

## 2023-09-25 HISTORY — PX: APPLICATION OF WOUND VAC: SHX5189

## 2023-09-25 LAB — POCT PREGNANCY, URINE: Preg Test, Ur: NEGATIVE

## 2023-09-25 SURGERY — APPLICATION, WOUND VAC
Anesthesia: General | Site: Hip | Laterality: Left

## 2023-09-25 MED ORDER — VANCOMYCIN HCL 500 MG IV SOLR
INTRAVENOUS | Status: AC
Start: 2023-09-25 — End: 2023-09-25
  Filled 2023-09-25: qty 10

## 2023-09-25 MED ORDER — LEVOFLOXACIN 750 MG PO TABS: 750.0000 mg | ORAL_TABLET | Freq: Every day | ORAL | 0 refills | Status: AC

## 2023-09-25 MED ORDER — ROCURONIUM BROMIDE 10 MG/ML (PF) SYRINGE
PREFILLED_SYRINGE | INTRAVENOUS | Status: AC
  Filled 2023-09-25: qty 10

## 2023-09-25 MED ORDER — PROPOFOL 10 MG/ML IV BOLUS
INTRAVENOUS | Status: DC | PRN
  Administered 2023-09-25: 20 mg via INTRAVENOUS
  Administered 2023-09-25: 50 mg via INTRAVENOUS
  Administered 2023-09-25: 180 mg via INTRAVENOUS

## 2023-09-25 MED ORDER — VANCOMYCIN HCL 1000 MG IV SOLR
INTRAVENOUS | Status: DC | PRN
  Administered 2023-09-25: 1000 mg via TOPICAL

## 2023-09-25 MED ORDER — ONDANSETRON HCL 4 MG/2ML IJ SOLN
INTRAMUSCULAR | Status: DC | PRN
  Administered 2023-09-25: 4 mg via INTRAVENOUS

## 2023-09-25 MED ORDER — LIDOCAINE 2% (20 MG/ML) 5 ML SYRINGE
INTRAMUSCULAR | Status: AC
  Filled 2023-09-25: qty 5

## 2023-09-25 MED ORDER — FENTANYL CITRATE (PF) 250 MCG/5ML IJ SOLN
INTRAMUSCULAR | Status: DC | PRN
  Administered 2023-09-25: 50 ug via INTRAVENOUS

## 2023-09-25 MED ORDER — 0.9 % SODIUM CHLORIDE (POUR BTL) OPTIME
TOPICAL | Status: DC | PRN
  Administered 2023-09-25: 1000 mL

## 2023-09-25 MED ORDER — HYDROGEN PEROXIDE 3 % EX SOLN
CUTANEOUS | Status: DC | PRN
Start: 2023-09-25 — End: 2023-09-25
  Administered 2023-09-25: 1

## 2023-09-25 MED ORDER — PROPOFOL 10 MG/ML IV BOLUS
INTRAVENOUS | Status: AC
  Filled 2023-09-25: qty 20

## 2023-09-25 MED ORDER — CHLORHEXIDINE GLUCONATE 0.12 % MT SOLN
15.0000 mL | Freq: Once | OROMUCOSAL | Status: AC
  Administered 2023-09-25: 15 mL via OROMUCOSAL
  Filled 2023-09-25: qty 15

## 2023-09-25 MED ORDER — PHENYLEPHRINE HCL-NACL 20-0.9 MG/250ML-% IV SOLN
INTRAVENOUS | Status: AC
  Filled 2023-09-25: qty 500

## 2023-09-25 MED ORDER — PHENYLEPHRINE 80 MCG/ML (10ML) SYRINGE FOR IV PUSH (FOR BLOOD PRESSURE SUPPORT)
PREFILLED_SYRINGE | INTRAVENOUS | Status: DC | PRN
  Administered 2023-09-25: 80 ug via INTRAVENOUS
  Administered 2023-09-25: 160 ug via INTRAVENOUS
  Administered 2023-09-25: 80 ug via INTRAVENOUS
  Administered 2023-09-25: 160 ug via INTRAVENOUS
  Administered 2023-09-25 (×2): 80 ug via INTRAVENOUS
  Administered 2023-09-25: 160 ug via INTRAVENOUS

## 2023-09-25 MED ORDER — PHENYLEPHRINE HCL-NACL 20-0.9 MG/250ML-% IV SOLN
INTRAVENOUS | Status: DC | PRN
  Administered 2023-09-25: 40 ug/min via INTRAVENOUS

## 2023-09-25 MED ORDER — LIDOCAINE 2% (20 MG/ML) 5 ML SYRINGE
INTRAMUSCULAR | Status: DC | PRN
  Administered 2023-09-25: 100 mg via INTRAVENOUS

## 2023-09-25 MED ORDER — DEXAMETHASONE SODIUM PHOSPHATE 10 MG/ML IJ SOLN
INTRAMUSCULAR | Status: AC
  Filled 2023-09-25: qty 1

## 2023-09-25 MED ORDER — MIDAZOLAM HCL 2 MG/2ML IJ SOLN
INTRAMUSCULAR | Status: DC | PRN
  Administered 2023-09-25: 2 mg via INTRAVENOUS

## 2023-09-25 MED ORDER — DEXMEDETOMIDINE HCL IN NACL 80 MCG/20ML IV SOLN
INTRAVENOUS | Status: AC
  Filled 2023-09-25: qty 20

## 2023-09-25 MED ORDER — FENTANYL CITRATE (PF) 250 MCG/5ML IJ SOLN
INTRAMUSCULAR | Status: AC
  Filled 2023-09-25: qty 5

## 2023-09-25 MED ORDER — ORAL CARE MOUTH RINSE: 15.0000 mL | Freq: Once | OROMUCOSAL | Status: AC

## 2023-09-25 MED ORDER — MIDAZOLAM HCL 2 MG/2ML IJ SOLN
INTRAMUSCULAR | Status: AC
  Filled 2023-09-25: qty 2

## 2023-09-25 MED ORDER — LACTATED RINGERS IV SOLN: INTRAVENOUS | Status: DC

## 2023-09-25 MED ORDER — PROPOFOL 1000 MG/100ML IV EMUL
INTRAVENOUS | Status: AC
Start: 2023-09-25 — End: 2023-09-25
  Filled 2023-09-25: qty 100

## 2023-09-25 MED ORDER — VANCOMYCIN HCL 1000 MG IV SOLR
INTRAVENOUS | Status: AC
  Filled 2023-09-25: qty 20

## 2023-09-25 MED ORDER — ONDANSETRON HCL 4 MG/2ML IJ SOLN
INTRAMUSCULAR | Status: AC
  Filled 2023-09-25: qty 2

## 2023-09-25 MED ORDER — DEXAMETHASONE SODIUM PHOSPHATE 10 MG/ML IJ SOLN
INTRAMUSCULAR | Status: DC | PRN
  Administered 2023-09-25: 10 mg via INTRAVENOUS

## 2023-09-25 SURGICAL SUPPLY — 33 items
BAG COUNTER SPONGE SURGICOUNT (BAG) ×1 IMPLANT
BNDG ELASTIC 4X5.8 VLCR STR LF (GAUZE/BANDAGES/DRESSINGS) IMPLANT
BNDG GAUZE DERMACEA FLUFF 4 (GAUZE/BANDAGES/DRESSINGS) IMPLANT
CANISTER SUCTION 3000ML PPV (SUCTIONS) ×1 IMPLANT
CANISTER WOUND CARE 500ML ATS (WOUND CARE) ×1 IMPLANT
COVER SURGICAL LIGHT HANDLE (MISCELLANEOUS) ×1 IMPLANT
DRAPE HALF SHEET 40X57 (DRAPES) IMPLANT
DRAPE INCISE IOBAN 66X45 STRL (DRAPES) IMPLANT
DRAPE SURG 17X23 STRL (DRAPES) IMPLANT
DRAPE SURG ORHT 6 SPLT 77X108 (DRAPES) IMPLANT
DRSG VAC GRANUFOAM LG (GAUZE/BANDAGES/DRESSINGS) IMPLANT
DRSG VAC GRANUFOAM MED (GAUZE/BANDAGES/DRESSINGS) IMPLANT
DRSG VAC GRANUFOAM SM (GAUZE/BANDAGES/DRESSINGS) IMPLANT
ELECTRODE REM PT RTRN 9FT ADLT (ELECTROSURGICAL) ×1 IMPLANT
GAUZE PAD ABD 8X10 STRL (GAUZE/BANDAGES/DRESSINGS) IMPLANT
GAUZE SPONGE 4X4 12PLY STRL (GAUZE/BANDAGES/DRESSINGS) IMPLANT
GAUZE XEROFORM 5X9 LF (GAUZE/BANDAGES/DRESSINGS) ×1 IMPLANT
GLOVE BIOGEL PI IND STRL 6.5 (GLOVE) ×1 IMPLANT
GLOVE BIOGEL PI IND STRL 7.5 (GLOVE) ×1 IMPLANT
GOWN STRL REUS W/ TWL LRG LVL3 (GOWN DISPOSABLE) ×2 IMPLANT
HYDROGEN PEROXIDE 16OZ (MISCELLANEOUS) IMPLANT
KIT BASIN OR (CUSTOM PROCEDURE TRAY) ×1 IMPLANT
KIT TURNOVER KIT B (KITS) ×1 IMPLANT
MANIFOLD NEPTUNE II (INSTRUMENTS) IMPLANT
NS IRRIG 1000ML POUR BTL (IV SOLUTION) ×1 IMPLANT
PACK ORTHO EXTREMITY (CUSTOM PROCEDURE TRAY) ×1 IMPLANT
PAD ARMBOARD POSITIONER FOAM (MISCELLANEOUS) ×1 IMPLANT
SPONGE T-LAP 18X18 ~~LOC~~+RFID (SPONGE) IMPLANT
SYR BULB IRRIG 60ML STRL (SYRINGE) IMPLANT
TOWEL GREEN STERILE (TOWEL DISPOSABLE) ×1 IMPLANT
TOWEL GREEN STERILE FF (TOWEL DISPOSABLE) ×1 IMPLANT
TUBE CONNECTING 12X1/4 (SUCTIONS) ×1 IMPLANT
YANKAUER SUCT BULB TIP NO VENT (SUCTIONS) ×1 IMPLANT

## 2023-09-25 NOTE — Anesthesia Procedure Notes (Signed)
 Procedure Name: LMA Insertion Date/Time: 09/25/2023 7:40 AM  Performed by: Mannie Krystal LABOR, CRNAPre-anesthesia Checklist: Patient identified, Emergency Drugs available, Suction available, Patient being monitored and Timeout performed Patient Re-evaluated:Patient Re-evaluated prior to induction Oxygen Delivery Method: Circle system utilized Preoxygenation: Pre-oxygenation with 100% oxygen Induction Type: IV induction Ventilation: Mask ventilation without difficulty LMA: LMA inserted LMA Size: 4.0

## 2023-09-25 NOTE — Anesthesia Postprocedure Evaluation (Signed)
 Anesthesia Post Note  Patient: [name]  Procedure(s) Performed: WOUND VAC CHANGE (Left: Hip) IRRIGATION AND DEBRIDEMENT HIP (Left: Hip)     Patient location during evaluation: PACU Anesthesia Type: General Level of consciousness: awake and alert Pain management: pain level controlled Vital Signs Assessment: post-procedure vital signs reviewed and stable Respiratory status: spontaneous breathing, nonlabored ventilation, respiratory function stable and patient connected to nasal cannula oxygen Cardiovascular status: blood pressure returned to baseline and stable Postop Assessment: no apparent nausea or vomiting Anesthetic complications: no   No notable events documented.  Last Vitals:  Vitals:   09/25/23 0900 09/25/23 0915  BP: (!) 114/59 (!) 110/59  Pulse: 98 99  Resp: 15 15  Temp:  36.8 C  SpO2: 99% 98%    Last Pain:  Vitals:   09/25/23 0915  TempSrc:   PainSc: 0-No pain                 Rome Ade

## 2023-09-25 NOTE — Interval H&P Note (Signed)
 History and Physical Interval Note:  09/25/2023 7:17 AM  [name] Cedar  has presented today for surgery, with the diagnosis of Left hip wound.  The various methods of treatment have been discussed with the patient and family. After consideration of risks, benefits and other options for treatment, the patient has consented to  Procedure(s): APPLICATION, WOUND VAC (Left) as a surgical intervention.  The patient's history has been reviewed, patient examined, no change in status, stable for surgery.  I have reviewed the patient's chart and labs.  Questions were answered to the patient's satisfaction.     [name]

## 2023-09-25 NOTE — Discharge Instructions (Signed)
 [facility] Discharge Instructions   General Discharge Instructions  WEIGHT BEARING STATUS:weightbearing as tolerated  RANGE OF MOTION/ACTIVITY: OK for hip motion as tolerated  Wound Care: Maintain wound vac until follow-up. Do not get area wet. Change wound vac canister as needed  DVT/PE prophylaxis: None  Diet: as you were eating previously.  Can use over the counter stool softeners and bowel preparations, such as Miralax , to help with bowel movements.  Narcotics can be constipating.  Be sure to drink plenty of fluids  PAIN MEDICATION USE AND EXPECTATIONS  You have likely been given narcotic medications to help control your pain.  After a traumatic event that results in an fracture (broken bone) with or without surgery, it is ok to use narcotic pain medications to help control one's pain.  We understand that everyone responds to pain differently and each individual patient will be evaluated on a regular basis for the continued need for narcotic medications. Ideally, narcotic medication use should last no more than 6-8 weeks (coinciding with fracture healing).   As a patient it is your responsibility as well to monitor narcotic medication use and report the amount and frequency you use these medications when you come to your office visit.   We would also advise that if you are using narcotic medications, you should take a dose prior to therapy to maximize you participation.  IF YOU ARE ON NARCOTIC MEDICATIONS IT IS NOT PERMISSIBLE TO OPERATE A MOTOR VEHICLE (MOTORCYCLE/CAR/TRUCK/MOPED) OR HEAVY MACHINERY DO NOT MIX NARCOTICS WITH OTHER CNS (CENTRAL NERVOUS SYSTEM) DEPRESSANTS SUCH AS ALCOHOL   POST-OPERATIVE OPIOID TAPER INSTRUCTIONS: It is important to wean off of your opioid medication as soon as possible. If you do not need pain medication after your surgery it is ok to stop day one. Opioids include: Codeine, Hydrocodone (Norco, Vicodin), Oxycodone (Percocet, oxycontin )  and hydromorphone  amongst others.  Long term and even short term use of opiods can cause: Increased pain response Dependence Constipation Depression Respiratory depression And more.  Withdrawal symptoms can include Flu like symptoms Nausea, vomiting And more Techniques to manage these symptoms Hydrate well Eat regular healthy meals Stay active Use relaxation techniques(deep breathing, meditating, yoga) Do Not substitute Alcohol  to help with tapering If you have been on opioids for less than two weeks and do not have pain than it is ok to stop all together.  Plan to wean off of opioids This plan should start within one week post op of your fracture surgery  Maintain the same interval or time between taking each dose and first decrease the dose.  Cut the total daily intake of opioids by one tablet each day Next start to increase the time between doses. The last dose that should be eliminated is the evening dose.    STOP SMOKING OR USING NICOTINE  PRODUCTS!!!!  As discussed nicotine  severely impairs your body's ability to heal surgical and traumatic wounds but also impairs bone healing.  Wounds and bone heal by forming microscopic blood vessels (angiogenesis) and nicotine  is a vasoconstrictor (essentially, shrinks blood vessels).  Therefore, if vasoconstriction occurs to these microscopic blood vessels they essentially disappear and are unable to deliver necessary nutrients to the healing tissue.  This is one modifiable factor that you can do to dramatically increase your chances of healing your injury.  (This means no smoking, no nicotine  gum, patches, etc)  DO NOT USE NONSTEROIDAL ANTI-INFLAMMATORY DRUGS (NSAID'S)  Using products such as Advil  (ibuprofen ), Aleve  (naproxen ), Motrin  (ibuprofen ) for additional pain control during fracture healing  can delay and/or prevent the healing response.  If you would like to take over the counter (OTC) medication, Tylenol  (acetaminophen ) is ok.   However, some narcotic medications that are given for pain control contain acetaminophen  as well. Therefore, you should not exceed more than 4000 mg of tylenol  in a day if you do not have liver disease.  Also note that there are may OTC medicines, such as cold medicines and allergy medicines that my contain tylenol  as well.  If you have any questions about medications and/or interactions please ask your doctor/PA or your pharmacist.   USE AN ACE WRAP OR TED HOSE FOR SWELLING CONTROL  In addition to icing and elevation, Ace wraps or TED hose are used to help limit and resolve swelling.  It is recommended to use Ace wraps or TED hose until you are informed to stop.    When using Ace Wraps start the wrapping distally (farthest away from the body) and wrap proximally (closer to the body)   Example: If you had surgery on your leg or thing and you do not have a splint on, start the ace wrap at the toes and work your way up to the thigh        If you had surgery on your upper extremity and do not have a splint on, start the ace wrap at your fingers and work your way up to the upper arm  CALL THE OFFICE FOR MEDICATION REFILLS OR WITH ANY QUESTIONS/CONCERNS: [phone]   VISIT OUR WEBSITE FOR ADDITIONAL INFORMATION: [email or web address]  Call office for the following: Temperature greater than 101F Persistent nausea and vomiting Severe uncontrolled pain Redness, tenderness, or signs of infection (pain, swelling, redness, odor or green/yellow discharge around the site) Difficulty breathing, headache or visual disturbances Hives Persistent dizziness or light-headedness Extreme fatigue Any other questions or concerns you may have after discharge  In an emergency, call 911 or go to an Emergency Department at a nearby hospital  OTHER HELPFUL INFORMATION  You should wean off your narcotic medicines as soon as you are able.  Most patients will be off or using minimal narcotics before their first postop  appointment.   We suggest you use the pain medication the first night prior to going to bed, in order to ease any pain when the anesthesia wears off. You should avoid taking pain medications on an empty stomach as it will make you nauseous.  Do not drink alcoholic beverages or take illicit drugs when taking pain medications.  Pain medication may make you constipated.  Below are a few solutions to try in this order: Decrease the amount of pain medication if you aren't having pain. Drink lots of decaffeinated fluids. Drink prune juice and/or each dried prunes  If the first 3 don't work start with additional solutions Take Colace - an over-the-counter stool softener Take Senokot - an over-the-counter laxative Take Miralax  - a stronger over-the-counter laxative

## 2023-09-25 NOTE — Op Note (Signed)
 Orthopaedic Surgery Operative Note (CSN: [id number] ) Date of Surgery: 09/25/2023  Admit Date: 09/25/2023   Diagnoses: Pre-Op Diagnoses: Left hip wound  Post-Op Diagnosis: Left hip wound Persistent drainage with retained wound VAC sponge  Procedures: CPT 26990-Irrigation and debridement of left hip wound CPT 97605-Wound vac placement left hip  Surgeons : Primary: Kendal Franky SQUIBB, MD  Assistant: [name], PA-C  Location: OR 3   Anesthesia: General  Antibiotics: 1 gm vancomycin  powder placed topically   Tourniquet time: None    Estimated Blood Loss: 30 mL  Complications:* No complications entered in OR log *   Specimens:* No specimens in log *   Implants: * No implants in log *   Indications for Surgery: 28 year old female who had a left hip wound that had persistent drainage.  We have been doing serial wound VAC changes.  Unfortunately she was lost to follow-up in the middle of performing these and she returns 6 weeks after the last wound VAC.  We have debrided some of the bleed sponge that was in place.  It had adhered to the soft tissues.  We are doing serial wound VAC changes after this and her size of her wound was decreased however she returned 1 week with purulent drainage from it.  We did a debridement and started on antibiotics and it diminished in size.  However she still felt that the odor was present.  She was indicated for repeat irrigation and wound VAC change.  Operative Findings: 1.  Retained wound VAC sponge that was present with persistent drainage from this area treated with repeat debridement and removal of the foreign body. 2.  Wound VAC placement to left hip wound.  Procedure: The patient was identified in the preoperative holding area. Consent was confirmed with the patient and their family and all questions were answered. The operative extremity was marked after confirmation with the patient. she was then brought back to the operating room by our  anesthesia colleagues.  She was placed under general anesthetic carefully transferred over to a regular OR table.  The left hip was then prepped and draped in usual sterile fashion.  A timeout was performed to verify the patient, the procedure, and the extremity.  Preoperative antibiotics were not indicated.  The blue sponge was removed.  Expressible fluid was seen and deep portion of the wound.  I then probed back which identified that there was a still tracked down to the deep portion of the wound.  I then used a Kocher to grasp this tissue which still had some blue sponge.  As result I then extended the incision and opened the wound back up to be able to access that sponge.  I used a curette and Cobb elevator to debride the adhered sponge.  I was able to visualize the majority of it.  I was able to remove the sponge especially in the area that was of concern.  I then irrigated the wound with normal saline.  I placed a gram of vancomycin  powder.  I then placed a black granular foam sponge in the wound.  I connected it to 125 mmHg.  Patient was then awoke from anesthesia and taken to the PACU in stable condition.   Debridement type: Excisional Debridement  Side: left  Body Location: Hip  Tools used for debridement: scalpel, curette, and rongeur  Pre-debridement Wound size (cm):   Length: 3.5        Width: 2     Depth: 3  Post-debridement Wound size (cm):   Length: 4        Width: 2     Depth: 6   Debridement depth beyond dead/damaged tissue down to healthy viable tissue: yes  Tissue layer involved: skin, subcutaneous tissue, muscle / fascia  Nature of tissue removed: Purulence and Other: wound vac sponge  Irrigation volume: 1L     Irrigation fluid type: Normal Saline   Post Op Plan/Instructions: Patient will be weightbearing as tolerated.  Unfortunately with the retained sponge likely will need to return to the operating room next week for another look to make sure that there is no  further drainage and retained sponge.  Patient will discharge home continue antibiotics.  Return next week.  I was present and performed the entire surgery.  [name], PA-C did assist me throughout the case. An assistant was necessary given the difficulty in approach, maintenance of reduction and ability to instrument the fracture.   Franky Light, MD [facility]

## 2023-09-25 NOTE — Transfer of Care (Signed)
 Immediate Anesthesia Transfer of Care Note  Patient: [name]  Procedure(s) Performed: WOUND VAC CHANGE (Left: Hip) IRRIGATION AND DEBRIDEMENT HIP (Left: Hip)  Patient Location: PACU  Anesthesia Type:General  Level of Consciousness: awake and alert   Airway & Oxygen Therapy: Patient Spontanous Breathing and Patient connected to face mask oxygen  Post-op Assessment: Report given to RN and Post -op Vital signs reviewed and stable  Post vital signs: Reviewed and stable  Last Vitals:  Vitals Value Taken Time  BP 117/63 09/25/23 08:42  Temp 36.8 C 09/25/23 08:42  Pulse 99 09/25/23 08:42  Resp 14 09/25/23 08:45  SpO2 100 % 09/25/23 08:42  Vitals shown include unfiled device data.  Last Pain:  Vitals:   09/25/23 0842  TempSrc:   PainSc: Asleep      Patients Stated Pain Goal: 0 (09/25/23 0636)  Complications: No notable events documented.

## 2023-09-29 ENCOUNTER — Other Ambulatory Visit: Payer: Self-pay

## 2023-09-29 ENCOUNTER — Encounter (HOSPITAL_COMMUNITY): Payer: Self-pay | Admitting: Student
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
## 2023-09-29 NOTE — Progress Notes (Signed)
 SDW call  Patient was given pre-op instructions over the phone. Patient verbalized understanding of instructions provided.     PCP - denies Cardiologist -  Pulmonary:    PPM/ICD - denies Device Orders - na Rep Notified - na   Chest x-ray - 07/11/2023 EKG -  07/11/2023 Stress Test - ECHO -  Cardiac Cath -   Sleep Study/sleep apnea/CPAP: denies  Non-diabetic  Blood Thinner Instructions: denies Aspirin  Instructions:denies   ERAS Protcol - NPO   Anesthesia review: Yes. Difficult airway   Patient denies shortness of breath, fever, cough and chest pain over the phone call  Your procedure is scheduled on Wednesday September 30, 2023  Report to [facility] Main Entrance A at  0630  A.M., then check in with the Admitting office.  Call this number if you have problems the morning of surgery:  [phone]   If you have any questions prior to your surgery date call [phone]: Open Monday-Friday 8am-4pm If you experience any cold or flu symptoms such as cough, fever, chills, shortness of breath, etc. between now and your scheduled surgery, please notify us  at the above number    Remember:  Do not eat or drink after midnight the night before your surgery  Take these medicines the morning of surgery with A SIP OF WATER :  Neurontin , levaquin , Suboxone  - follow providers instructions  As needed: Tylenol , hydroxyzine , zofran   As of today, STOP taking any Aspirin  (unless otherwise instructed by your surgeon) Aleve , Naproxen , Ibuprofen , Motrin , Advil , Goody's, BC's, all herbal medications, fish oil, and all vitamins.

## 2023-09-30 ENCOUNTER — Ambulatory Visit (HOSPITAL_COMMUNITY)
Admission: RE | Admit: 2023-09-30 | Discharge: 2023-09-30 | Disposition: A | Discharge status: Home / Self Care | Payer: MEDICAID | Attending: Student | Admitting: Student

## 2023-09-30 ENCOUNTER — Ambulatory Visit (HOSPITAL_BASED_OUTPATIENT_CLINIC_OR_DEPARTMENT_OTHER): Payer: MEDICAID | Admitting: Vascular Surgery

## 2023-09-30 ENCOUNTER — Encounter (HOSPITAL_COMMUNITY): Payer: Self-pay | Admitting: Student

## 2023-09-30 ENCOUNTER — Other Ambulatory Visit: Payer: Self-pay

## 2023-09-30 ENCOUNTER — Ambulatory Visit (HOSPITAL_COMMUNITY): Payer: MEDICAID | Admitting: Vascular Surgery

## 2023-09-30 ENCOUNTER — Encounter (HOSPITAL_COMMUNITY)
Admission: RE | Disposition: A | Discharge status: Home / Self Care | Payer: Self-pay | Source: Home / Self Care | Attending: Student

## 2023-09-30 DIAGNOSIS — S71002A Unspecified open wound, left hip, initial encounter: Secondary | ICD-10-CM

## 2023-09-30 DIAGNOSIS — F17210 Nicotine dependence, cigarettes, uncomplicated: Secondary | ICD-10-CM | POA: Insufficient documentation

## 2023-09-30 DIAGNOSIS — F419 Anxiety disorder, unspecified: Secondary | ICD-10-CM | POA: Diagnosis not present

## 2023-09-30 DIAGNOSIS — Z79899 Other long term (current) drug therapy: Secondary | ICD-10-CM | POA: Insufficient documentation

## 2023-09-30 DIAGNOSIS — F32A Depression, unspecified: Secondary | ICD-10-CM | POA: Insufficient documentation

## 2023-09-30 DIAGNOSIS — K219 Gastro-esophageal reflux disease without esophagitis: Secondary | ICD-10-CM | POA: Diagnosis not present

## 2023-09-30 DIAGNOSIS — Z6841 Body Mass Index (BMI) 40.0 and over, adult: Secondary | ICD-10-CM | POA: Insufficient documentation

## 2023-09-30 DIAGNOSIS — E66813 Obesity, class 3: Secondary | ICD-10-CM | POA: Diagnosis not present

## 2023-09-30 DIAGNOSIS — F418 Other specified anxiety disorders: Secondary | ICD-10-CM | POA: Diagnosis not present

## 2023-09-30 DIAGNOSIS — X58XXXA Exposure to other specified factors, initial encounter: Secondary | ICD-10-CM | POA: Insufficient documentation

## 2023-09-30 HISTORY — PX: WOUND EXPLORATION: SHX6188

## 2023-09-30 LAB — COMPREHENSIVE METABOLIC PANEL WITH GFR
ALT: 24 U/L (ref 0–44)
AST: 29 U/L (ref 15–41)
Albumin: 3.6 g/dL (ref 3.5–5.0)
Alkaline Phosphatase: 108 U/L (ref 38–126)
Anion gap: 11 (ref 5–15)
BUN: 16 mg/dL (ref 6–20)
CO2: 27 mmol/L (ref 22–32)
Calcium: 9.1 mg/dL (ref 8.9–10.3)
Chloride: 102 mmol/L (ref 98–111)
Creatinine, Ser: 0.8 mg/dL (ref 0.44–1.00)
GFR, Estimated: >60 mL/min (ref >60)
Glucose, Bld: 84 mg/dL (ref 70–99)
Potassium: 4.2 mmol/L (ref 3.5–5.1)
Sodium: 140 mmol/L (ref 135–145)
Total Bilirubin: 0.5 mg/dL (ref 0.0–1.2)
Total Protein: 6.1 g/dL — ABNORMAL LOW (ref 6.5–8.1)

## 2023-09-30 LAB — CBC
HCT: 39.2 % (ref 36.0–46.0)
Hemoglobin: 11.9 g/dL — ABNORMAL LOW (ref 12.0–15.0)
MCH: 24.7 pg — ABNORMAL LOW (ref 26.0–34.0)
MCHC: 30.4 g/dL (ref 30.0–36.0)
MCV: 81.5 fL (ref 80.0–100.0)
Platelets: 258 K/uL (ref 150–400)
RBC: 4.81 MIL/uL (ref 3.87–5.11)
RDW: 13.5 % (ref 11.5–15.5)
WBC: 4.4 K/uL (ref 4.0–10.5)
nRBC: 0 % (ref 0.0–0.2)

## 2023-09-30 LAB — POCT PREGNANCY, URINE: Preg Test, Ur: NEGATIVE

## 2023-09-30 SURGERY — WOUND EXPLORATION
Anesthesia: General | Site: Hip | Laterality: Left

## 2023-09-30 MED ORDER — CEFAZOLIN SODIUM-DEXTROSE 2-4 GM/100ML-% IV SOLN
2.0000 g | INTRAVENOUS | Status: AC
  Administered 2023-09-30: 2 g via INTRAVENOUS
  Filled 2023-09-30: qty 100

## 2023-09-30 MED ORDER — EPHEDRINE 5 MG/ML INJ
INTRAVENOUS | Status: AC
  Filled 2023-09-30: qty 5

## 2023-09-30 MED ORDER — PROPOFOL 10 MG/ML IV BOLUS
INTRAVENOUS | Status: DC | PRN
  Administered 2023-09-30: 100 mg via INTRAVENOUS
  Administered 2023-09-30 (×2): 50 mg via INTRAVENOUS
  Administered 2023-09-30: 200 mg via INTRAVENOUS

## 2023-09-30 MED ORDER — ACETAMINOPHEN 500 MG PO TABS: 1000.0000 mg | ORAL_TABLET | Freq: Once | ORAL | Status: DC

## 2023-09-30 MED ORDER — PROPOFOL 10 MG/ML IV BOLUS
INTRAVENOUS | Status: AC
  Filled 2023-09-30: qty 20

## 2023-09-30 MED ORDER — FENTANYL CITRATE (PF) 250 MCG/5ML IJ SOLN
INTRAMUSCULAR | Status: DC | PRN
  Administered 2023-09-30 (×2): 50 ug via INTRAVENOUS

## 2023-09-30 MED ORDER — GABAPENTIN 300 MG PO CAPS: 300.0000 mg | ORAL_CAPSULE | Freq: Once | ORAL | Status: DC

## 2023-09-30 MED ORDER — OXYCODONE HCL 5 MG PO TABS: 5.0000 mg | ORAL_TABLET | Freq: Once | ORAL | Status: DC | PRN

## 2023-09-30 MED ORDER — CHLORHEXIDINE GLUCONATE 0.12 % MT SOLN: 15.0000 mL | Freq: Once | OROMUCOSAL | Status: AC

## 2023-09-30 MED ORDER — ONDANSETRON HCL 4 MG/2ML IJ SOLN
INTRAMUSCULAR | Status: DC | PRN
  Administered 2023-09-30: 4 mg via INTRAVENOUS

## 2023-09-30 MED ORDER — SODIUM CHLORIDE 0.9 % IR SOLN
Status: DC | PRN
  Administered 2023-09-30: 3000 mL

## 2023-09-30 MED ORDER — HYDROMORPHONE HCL 1 MG/ML IJ SOLN
INTRAMUSCULAR | Status: AC
  Filled 2023-09-30: qty 1

## 2023-09-30 MED ORDER — OXYCODONE HCL 5 MG/5ML PO SOLN: 5.0000 mg | Freq: Once | ORAL | Status: DC | PRN

## 2023-09-30 MED ORDER — DROPERIDOL 2.5 MG/ML IJ SOLN: 0.6250 mg | Freq: Once | INTRAMUSCULAR | Status: DC | PRN

## 2023-09-30 MED ORDER — HYDROMORPHONE HCL 1 MG/ML IJ SOLN
0.2500 mg | INTRAMUSCULAR | Status: DC | PRN
  Administered 2023-09-30 (×3): 0.5 mg via INTRAVENOUS

## 2023-09-30 MED ORDER — VANCOMYCIN HCL 1000 MG IV SOLR
INTRAVENOUS | Status: DC | PRN
  Administered 2023-09-30: 1000 mg via TOPICAL

## 2023-09-30 MED ORDER — PHENYLEPHRINE 80 MCG/ML (10ML) SYRINGE FOR IV PUSH (FOR BLOOD PRESSURE SUPPORT)
PREFILLED_SYRINGE | INTRAVENOUS | Status: DC | PRN
  Administered 2023-09-30: 80 ug via INTRAVENOUS
  Administered 2023-09-30: 160 ug via INTRAVENOUS

## 2023-09-30 MED ORDER — LACTATED RINGERS IV SOLN: INTRAVENOUS | Status: DC

## 2023-09-30 MED ORDER — 0.9 % SODIUM CHLORIDE (POUR BTL) OPTIME
TOPICAL | Status: DC | PRN
  Administered 2023-09-30: 1000 mL

## 2023-09-30 MED ORDER — DEXAMETHASONE SODIUM PHOSPHATE 10 MG/ML IJ SOLN
INTRAMUSCULAR | Status: DC | PRN
  Administered 2023-09-30: 10 mg via INTRAVENOUS

## 2023-09-30 MED ORDER — MIDAZOLAM HCL 2 MG/2ML IJ SOLN
INTRAMUSCULAR | Status: DC | PRN
  Administered 2023-09-30: 2 mg via INTRAVENOUS

## 2023-09-30 MED ORDER — PHENYLEPHRINE HCL-NACL 20-0.9 MG/250ML-% IV SOLN
INTRAVENOUS | Status: DC | PRN
  Administered 2023-09-30: 50 ug/min via INTRAVENOUS

## 2023-09-30 MED ORDER — LIDOCAINE 2% (20 MG/ML) 5 ML SYRINGE
INTRAMUSCULAR | Status: DC | PRN
  Administered 2023-09-30: 100 mg via INTRAVENOUS

## 2023-09-30 MED ORDER — ORAL CARE MOUTH RINSE: 15.0000 mL | Freq: Once | OROMUCOSAL | Status: AC

## 2023-09-30 MED ORDER — CHLORHEXIDINE GLUCONATE 0.12 % MT SOLN
OROMUCOSAL | Status: AC
  Administered 2023-09-30: 15 mL via OROMUCOSAL
  Filled 2023-09-30: qty 15

## 2023-09-30 MED ORDER — FENTANYL CITRATE (PF) 250 MCG/5ML IJ SOLN
INTRAMUSCULAR | Status: AC
  Filled 2023-09-30: qty 5

## 2023-09-30 MED ORDER — VANCOMYCIN HCL 1000 MG IV SOLR
INTRAVENOUS | Status: AC
  Filled 2023-09-30: qty 20

## 2023-09-30 MED ORDER — TOBRAMYCIN SULFATE 1.2 G IJ SOLR
INTRAMUSCULAR | Status: AC
  Filled 2023-09-30: qty 1.2

## 2023-09-30 MED ORDER — MIDAZOLAM HCL 2 MG/2ML IJ SOLN
INTRAMUSCULAR | Status: AC
  Filled 2023-09-30: qty 2

## 2023-09-30 SURGICAL SUPPLY — 41 items
BAG COUNTER SPONGE SURGICOUNT (BAG) ×1 IMPLANT
BNDG COHESIVE 4X5 TAN STRL LF (GAUZE/BANDAGES/DRESSINGS) ×1 IMPLANT
BNDG GAUZE DERMACEA FLUFF 4 (GAUZE/BANDAGES/DRESSINGS) ×2 IMPLANT
BRUSH SCRUB EZ PLAIN DRY (MISCELLANEOUS) ×2 IMPLANT
CHLORAPREP W/TINT 26 (MISCELLANEOUS) ×1 IMPLANT
COVER MAYO STAND STRL (DRAPES) ×1 IMPLANT
COVER SURGICAL LIGHT HANDLE (MISCELLANEOUS) ×2 IMPLANT
DRAPE SURG 17X23 STRL (DRAPES) ×1 IMPLANT
DRAPE SURG ORHT 6 SPLT 77X108 (DRAPES) ×1 IMPLANT
DRAPE U-SHAPE 47X51 STRL (DRAPES) ×1 IMPLANT
DRESSING PEEL AND PLC PRVNA 13 (GAUZE/BANDAGES/DRESSINGS) IMPLANT
DRSG ADAPTIC 3X8 NADH LF (GAUZE/BANDAGES/DRESSINGS) ×1 IMPLANT
ELECTRODE REM PT RTRN 9FT ADLT (ELECTROSURGICAL) IMPLANT
EVACUATOR 1/8 PVC DRAIN (DRAIN) IMPLANT
GAUZE SPONGE 4X4 12PLY STRL (GAUZE/BANDAGES/DRESSINGS) ×1 IMPLANT
GLOVE BIO SURGEON STRL SZ 6.5 (GLOVE) ×3 IMPLANT
GLOVE BIO SURGEON STRL SZ7.5 (GLOVE) ×4 IMPLANT
GLOVE BIOGEL PI IND STRL 6.5 (GLOVE) ×1 IMPLANT
GLOVE BIOGEL PI IND STRL 7.5 (GLOVE) ×1 IMPLANT
GOWN STRL REUS W/ TWL LRG LVL3 (GOWN DISPOSABLE) ×2 IMPLANT
GRAFT MATRIX 2 LAYER 5X5 (Graft) IMPLANT
KIT BASIN OR (CUSTOM PROCEDURE TRAY) ×1 IMPLANT
KIT TURNOVER KIT B (KITS) ×1 IMPLANT
MANIFOLD NEPTUNE II (INSTRUMENTS) ×1 IMPLANT
NS IRRIG 1000ML POUR BTL (IV SOLUTION) ×1 IMPLANT
PACK ORTHO EXTREMITY (CUSTOM PROCEDURE TRAY) ×1 IMPLANT
PAD ARMBOARD POSITIONER FOAM (MISCELLANEOUS) ×2 IMPLANT
PADDING CAST COTTON 6X4 STRL (CAST SUPPLIES) ×1 IMPLANT
SET HNDPC FAN SPRY TIP SCT (DISPOSABLE) IMPLANT
SPONGE T-LAP 18X18 ~~LOC~~+RFID (SPONGE) ×1 IMPLANT
SUT ETHILON 2 0 FS 18 (SUTURE) ×2 IMPLANT
SUT ETHILON 3 0 PS 1 (SUTURE) ×2 IMPLANT
SUT MON AB 2-0 CT1 36 (SUTURE) ×1 IMPLANT
SUT PDS AB 0 CT 36 (SUTURE) IMPLANT
SWAB CULTURE ESWAB REG 1ML (MISCELLANEOUS) IMPLANT
TOWEL GREEN STERILE (TOWEL DISPOSABLE) ×2 IMPLANT
TOWEL GREEN STERILE FF (TOWEL DISPOSABLE) ×1 IMPLANT
TUBE CONNECTING 12X1/4 (SUCTIONS) ×1 IMPLANT
UNDERPAD 30X36 HEAVY ABSORB (UNDERPADS AND DIAPERS) ×1 IMPLANT
WATER STERILE IRR 1000ML POUR (IV SOLUTION) ×1 IMPLANT
YANKAUER SUCT BULB TIP NO VENT (SUCTIONS) ×1 IMPLANT

## 2023-09-30 NOTE — Anesthesia Procedure Notes (Signed)
 Procedure Name: LMA Insertion Date/Time: 09/30/2023 10:52 AM  Performed by: Darlyn Rush, MDPre-anesthesia Checklist: Patient identified, Emergency Drugs available, Suction available, Patient being monitored and Timeout performed Patient Re-evaluated:Patient Re-evaluated prior to induction Oxygen Delivery Method: Circle system utilized Preoxygenation: Pre-oxygenation with 100% oxygen Induction Type: IV induction Ventilation: Mask ventilation without difficulty LMA: LMA inserted LMA Size: 4.0 Comments: LMA placed by NORVA Speed under MD/DO and CRNA supervision

## 2023-09-30 NOTE — Op Note (Signed)
 Orthopaedic Surgery Operative Note (CSN: [id number] ) Date of Surgery: 09/30/2023  Admit Date: 09/30/2023   Diagnoses: Pre-Op Diagnoses: Left hip wound  Post-Op Diagnosis: Same  Procedures: CPT 15271-Skin graft substitute placement to left hip wound CPT 97605-Wound vac placement to left hip  Surgeons : Primary: Kendal Franky SQUIBB, MD  Assistant: [name], PA-C  Location: OR 3   Anesthesia: General   Antibiotics: Ancef  2g preop with 1gm vancomycin  powder placed topically   Tourniquet time: None    Estimated Blood Loss: None  Complications:* No complications entered in OR log *   Specimens:* No specimens in log *   Implants: * No implants in log *   Indications for Surgery: Has been undergoing serial debridements and wound VAC changes to the left hip.  She presents today for recheck.  Wound drainage has diminished and foul-smelling odor has diminished as well.  She remains on antibiotics. Risks and benefits were discussed with the patient and she agrees to proceed with surgery.  Operative Findings: 1.  Clean appearing wound and soft tissue.  Placement of Myriad 5x5cm sheet and placement of the wound to assist with granulation with primary closure of wound 2.  Incisional VAC to left hip.  Procedure: The patient was identified in the preoperative holding area. Consent was confirmed with the patient and their family and all questions were answered. The operative extremity was marked after confirmation with the patient. she was then brought back to the operating room by our anesthesia colleagues.  She was carefully transferred over to radiolucent flattop table.  She was placed under general anesthetic.  The left hip was then prepped and draped in usual sterile fashion.  A timeout was performed to verify the patient, the procedure, and the extremity.  Preoperative antibiotics were dosed.  The sponge was removed.  The wound was benign-appearing and there was no purulence that was  expressible from deep.  I explored the wound and there was no soft tissue foreign bodies.  The sponge.  The removed completely.  And irrigated the wound placed a gram of vancomycin  powder.  I then placed a sheet of myriad 5x5cm into the wound.  I felt that this would help with granulation tissue and allow for primary closure.  I used 2-0 nylon to approximate the skin edges.  Her incisional wound VAC was then placed.  The patient was then awoke from anesthesia and taken to the PACU in stable condition.  Post Op Plan/Instructions: Patient will be weightbearing as tolerated to the left lower extremity.  She will continue antibiotics.  Will have her follow-up next week in the office to remove the Prevena wound VAC and monitor her wound and incision.  I was present and performed the entire surgery.  [name], PA-C did assist me throughout the case. An assistant was necessary given the difficulty in approach, maintenance of reduction and ability to instrument the fracture.   Franky Kendal, MD [facility]

## 2023-09-30 NOTE — Interval H&P Note (Signed)
 History and Physical Interval Note:  09/30/2023 9:57 AM  [name] Cedar  has presented today for surgery, with the diagnosis of Left hip wound.  The various methods of treatment have been discussed with the patient and family. After consideration of risks, benefits and other options for treatment, the patient has consented to  Procedure(s): WOUND EXPLORATION HIP (Left) as a surgical intervention.  The patient's history has been reviewed, patient examined, no change in status, stable for surgery.  I have reviewed the patient's chart and labs.  Questions were answered to the patient's satisfaction.     Eirene Rather P Meranda Dechaine

## 2023-09-30 NOTE — H&P (Signed)
 Orthopaedic H&P  Patient returns for wound VAC change and wound exploration to evaluate further infection.  Patient denies any significant fevers.  Smell has stopped.  Continues with some serosanguineous drainage.  No other changes since last week.  Physical exam wound VAC is in place since good seal.  Sensation intact to light touch.  Patient is able to move his hip and leg.  Neurovascular intact.  No new imaging  Assessment/plan: Will plan to proceed with repeat irrigation debridement with wound VAC change.  Hopefully this will last time.  Will evaluate the wound to see if there is any further foreign body in place.  Risks and benefits were discussed with the patient and she agrees to proceed with surgery patient was marked and consent was confirmed.  Franky MYRTIS Light, MD [facility] [phone] (office) [email or web address]

## 2023-09-30 NOTE — Discharge Instructions (Addendum)
 [facility] Discharge Instructions   General Discharge Instructions  WEIGHT BEARING STATUS: Weightbearing as tolerated  RANGE OF MOTION/ACTIVITY: No range of motion restrictions  Wound Care: Keep incisional wound VAC in place until follow-up.  Change canister as needed  DVT/PE prophylaxis: None  Diet: as you were eating previously.  Can use over the counter stool softeners and bowel preparations, such as Miralax , to help with bowel movements.  Narcotics can be constipating.  Be sure to drink plenty of fluids  PAIN MEDICATION USE AND EXPECTATIONS  You have likely been given narcotic medications to help control your pain.  After a traumatic event that results in an fracture (broken bone) with or without surgery, it is ok to use narcotic pain medications to help control one's pain.  We understand that everyone responds to pain differently and each individual patient will be evaluated on a regular basis for the continued need for narcotic medications. Ideally, narcotic medication use should last no more than 6-8 weeks (coinciding with fracture healing).   As a patient it is your responsibility as well to monitor narcotic medication use and report the amount and frequency you use these medications when you come to your office visit.   We would also advise that if you are using narcotic medications, you should take a dose prior to therapy to maximize you participation.  IF YOU ARE ON NARCOTIC MEDICATIONS IT IS NOT PERMISSIBLE TO OPERATE A MOTOR VEHICLE (MOTORCYCLE/CAR/TRUCK/MOPED) OR HEAVY MACHINERY DO NOT MIX NARCOTICS WITH OTHER CNS (CENTRAL NERVOUS SYSTEM) DEPRESSANTS SUCH AS ALCOHOL   POST-OPERATIVE OPIOID TAPER INSTRUCTIONS: It is important to wean off of your opioid medication as soon as possible. If you do not need pain medication after your surgery it is ok to stop day one. Opioids include: Codeine, Hydrocodone (Norco, Vicodin), Oxycodone (Percocet, oxycontin ) and  hydromorphone  amongst others.  Long term and even short term use of opiods can cause: Increased pain response Dependence Constipation Depression Respiratory depression And more.  Withdrawal symptoms can include Flu like symptoms Nausea, vomiting And more Techniques to manage these symptoms Hydrate well Eat regular healthy meals Stay active Use relaxation techniques(deep breathing, meditating, yoga) Do Not substitute Alcohol  to help with tapering If you have been on opioids for less than two weeks and do not have pain than it is ok to stop all together.  Plan to wean off of opioids This plan should start within one week post op of your fracture surgery  Maintain the same interval or time between taking each dose and first decrease the dose.  Cut the total daily intake of opioids by one tablet each day Next start to increase the time between doses. The last dose that should be eliminated is the evening dose.    STOP SMOKING OR USING NICOTINE  PRODUCTS!!!!  As discussed nicotine  severely impairs your body's ability to heal surgical and traumatic wounds but also impairs bone healing.  Wounds and bone heal by forming microscopic blood vessels (angiogenesis) and nicotine  is a vasoconstrictor (essentially, shrinks blood vessels).  Therefore, if vasoconstriction occurs to these microscopic blood vessels they essentially disappear and are unable to deliver necessary nutrients to the healing tissue.  This is one modifiable factor that you can do to dramatically increase your chances of healing your injury.  (This means no smoking, no nicotine  gum, patches, etc)  ICE AND ELEVATE INJURED/OPERATIVE EXTREMITY  Using ice and elevating the injured extremity above your heart can help with swelling and pain control.  Icing in a pulsatile  fashion, such as 20 minutes on and 20 minutes off, can be followed.    Do not place ice directly on skin. Make sure there is a barrier between to skin and the ice pack.     Using frozen items such as frozen peas works well as the conform nicely to the are that needs to be iced.  USE AN ACE WRAP OR TED HOSE FOR SWELLING CONTROL  In addition to icing and elevation, Ace wraps or TED hose are used to help limit and resolve swelling.  It is recommended to use Ace wraps or TED hose until you are informed to stop.    When using Ace Wraps start the wrapping distally (farthest away from the body) and wrap proximally (closer to the body)   Example: If you had surgery on your leg or thing and you do not have a splint on, start the ace wrap at the toes and work your way up to the thigh        If you had surgery on your upper extremity and do not have a splint on, start the ace wrap at your fingers and work your way up to the upper arm  CALL THE OFFICE FOR MEDICATION REFILLS OR WITH ANY QUESTIONS/CONCERNS: [phone]   VISIT OUR WEBSITE FOR ADDITIONAL INFORMATION: [email or web address]  Call office for the following: Temperature greater than 101F Persistent nausea and vomiting Severe uncontrolled pain Redness, tenderness, or signs of infection (pain, swelling, redness, odor or green/yellow discharge around the site) Difficulty breathing, headache or visual disturbances Hives Persistent dizziness or light-headedness Extreme fatigue Any other questions or concerns you may have after discharge  In an emergency, call 911 or go to an Emergency Department at a nearby hospital  OTHER HELPFUL INFORMATION  Do not drink alcoholic beverages or take illicit drugs when taking pain medications.  In most states it is against the law to drive while you are in a splint or sling.  And certainly against the law to drive while taking narcotics.  You may return to work/school in the next couple of days when you feel up to it.   Pain medication may make you constipated.  Below are a few solutions to try in this order: Decrease the amount of pain medication if you aren't having  pain. Drink lots of decaffeinated fluids. Drink prune juice and/or each dried prunes  If the first 3 don't work start with additional solutions Take Colace - an over-the-counter stool softener Take Senokot - an over-the-counter laxative Take Miralax  - a stronger over-the-counter laxative

## 2023-09-30 NOTE — Transfer of Care (Signed)
 Immediate Anesthesia Transfer of Care Note  Patient: [name]  Procedure(s) Performed: IRRIGATION OF LEFT HIP WOUND WITH WOUND VAC CAHANGE (Left: Hip)  Patient Location: PACU  Anesthesia Type:General  Level of Consciousness: awake, alert , and oriented  Airway & Oxygen Therapy: Patient Spontanous Breathing and Patient connected to face mask oxygen  Post-op Assessment: Report given to RN and Post -op Vital signs reviewed and stable  Post vital signs: Reviewed and stable  Last Vitals:  Vitals Value Taken Time  BP 136/113 09/30/23 11:21  Temp    Pulse 95 09/30/23 11:23  Resp 16 09/30/23 11:23  SpO2 98 % 09/30/23 11:23  Vitals shown include unfiled device data.  Last Pain:  Vitals:   09/30/23 1123  TempSrc:   PainSc: 10-Worst pain ever         Complications: No notable events documented.

## 2023-10-01 ENCOUNTER — Encounter (HOSPITAL_COMMUNITY): Payer: Self-pay | Admitting: Student

## 2023-10-01 NOTE — Anesthesia Postprocedure Evaluation (Signed)
 Anesthesia Post Note  Patient: [name]  Procedure(s) Performed: IRRIGATION OF LEFT HIP WOUND WITH WOUND VAC CAHANGE (Left: Hip)     Patient location during evaluation: PACU Anesthesia Type: General Level of consciousness: sedated and patient cooperative Pain management: pain level controlled Vital Signs Assessment: post-procedure vital signs reviewed and stable Respiratory status: spontaneous breathing Cardiovascular status: stable Anesthetic complications: no   No notable events documented.  Last Vitals:  Vitals:   09/30/23 1145 09/30/23 1200  BP: 119/69 118/70  Pulse: 88 86  Resp: 16 16  Temp:  36.4 C  SpO2: 97% 98%    Last Pain:  Vitals:   09/30/23 1140  TempSrc:   PainSc: 10-Worst pain ever                 Norleen Pope

## 2023-11-04 ENCOUNTER — Emergency Department (HOSPITAL_COMMUNITY): Payer: MEDICAID

## 2023-11-04 ENCOUNTER — Inpatient Hospital Stay (HOSPITAL_COMMUNITY)
Admission: EM | Admit: 2023-11-04 | Discharge: 2023-11-13 | DRG: 854 | Discharge status: Against Medical Advice | Payer: MEDICAID | Attending: Student | Admitting: Student

## 2023-11-04 ENCOUNTER — Other Ambulatory Visit: Payer: Self-pay

## 2023-11-04 DIAGNOSIS — F17210 Nicotine dependence, cigarettes, uncomplicated: Secondary | ICD-10-CM | POA: Diagnosis present

## 2023-11-04 DIAGNOSIS — R6520 Severe sepsis without septic shock: Secondary | ICD-10-CM | POA: Diagnosis present

## 2023-11-04 DIAGNOSIS — Z91199 Patient's noncompliance with other medical treatment and regimen due to unspecified reason: Secondary | ICD-10-CM | POA: Diagnosis not present

## 2023-11-04 DIAGNOSIS — L02412 Cutaneous abscess of left axilla: Secondary | ICD-10-CM | POA: Diagnosis not present

## 2023-11-04 DIAGNOSIS — S71002A Unspecified open wound, left hip, initial encounter: Secondary | ICD-10-CM | POA: Diagnosis not present

## 2023-11-04 DIAGNOSIS — R109 Unspecified abdominal pain: Secondary | ICD-10-CM | POA: Diagnosis present

## 2023-11-04 DIAGNOSIS — E872 Acidosis, unspecified: Secondary | ICD-10-CM | POA: Diagnosis present

## 2023-11-04 DIAGNOSIS — S31109A Unspecified open wound of abdominal wall, unspecified quadrant without penetration into peritoneal cavity, initial encounter: Secondary | ICD-10-CM | POA: Diagnosis not present

## 2023-11-04 DIAGNOSIS — Z91040 Latex allergy status: Secondary | ICD-10-CM

## 2023-11-04 DIAGNOSIS — Z79899 Other long term (current) drug therapy: Secondary | ICD-10-CM | POA: Diagnosis not present

## 2023-11-04 DIAGNOSIS — T8140XD Infection following a procedure, unspecified, subsequent encounter: Secondary | ICD-10-CM | POA: Diagnosis not present

## 2023-11-04 DIAGNOSIS — T8189XA Other complications of procedures, not elsewhere classified, initial encounter: Secondary | ICD-10-CM | POA: Diagnosis not present

## 2023-11-04 DIAGNOSIS — F430 Acute stress reaction: Secondary | ICD-10-CM | POA: Diagnosis present

## 2023-11-04 DIAGNOSIS — E785 Hyperlipidemia, unspecified: Secondary | ICD-10-CM | POA: Diagnosis present

## 2023-11-04 DIAGNOSIS — F1120 Opioid dependence, uncomplicated: Secondary | ICD-10-CM | POA: Diagnosis present

## 2023-11-04 DIAGNOSIS — A419 Sepsis, unspecified organism: Secondary | ICD-10-CM | POA: Diagnosis present

## 2023-11-04 DIAGNOSIS — Z5329 Procedure and treatment not carried out because of patient's decision for other reasons: Secondary | ICD-10-CM | POA: Diagnosis not present

## 2023-11-04 DIAGNOSIS — T8140XA Infection following a procedure, unspecified, initial encounter: Principal | ICD-10-CM

## 2023-11-04 DIAGNOSIS — Z6841 Body Mass Index (BMI) 40.0 and over, adult: Secondary | ICD-10-CM

## 2023-11-04 DIAGNOSIS — F411 Generalized anxiety disorder: Secondary | ICD-10-CM | POA: Diagnosis present

## 2023-11-04 DIAGNOSIS — F418 Other specified anxiety disorders: Secondary | ICD-10-CM | POA: Diagnosis not present

## 2023-11-04 DIAGNOSIS — F439 Reaction to severe stress, unspecified: Secondary | ICD-10-CM | POA: Diagnosis present

## 2023-11-04 DIAGNOSIS — D509 Iron deficiency anemia, unspecified: Secondary | ICD-10-CM | POA: Diagnosis present

## 2023-11-04 DIAGNOSIS — N739 Female pelvic inflammatory disease, unspecified: Secondary | ICD-10-CM | POA: Diagnosis present

## 2023-11-04 DIAGNOSIS — Z885 Allergy status to narcotic agent status: Secondary | ICD-10-CM | POA: Diagnosis not present

## 2023-11-04 DIAGNOSIS — B9562 Methicillin resistant Staphylococcus aureus infection as the cause of diseases classified elsewhere: Secondary | ICD-10-CM | POA: Diagnosis present

## 2023-11-04 DIAGNOSIS — L02416 Cutaneous abscess of left lower limb: Secondary | ICD-10-CM | POA: Diagnosis present

## 2023-11-04 DIAGNOSIS — E66813 Obesity, class 3: Secondary | ICD-10-CM | POA: Diagnosis not present

## 2023-11-04 DIAGNOSIS — Z91041 Radiographic dye allergy status: Secondary | ICD-10-CM

## 2023-11-04 DIAGNOSIS — T148XXA Other injury of unspecified body region, initial encounter: Secondary | ICD-10-CM | POA: Diagnosis present

## 2023-11-04 LAB — CBC WITH DIFFERENTIAL/PLATELET
Basophils Absolute: 0.1 K/uL (ref 0.0–0.1)
Basophils Relative: 1 %
Eosinophils Absolute: 0.5 K/uL (ref 0.0–0.5)
Eosinophils Relative: 8 %
HCT: 37.9 % (ref 36.0–46.0)
Hemoglobin: 11.7 g/dL — ABNORMAL LOW (ref 12.0–15.0)
Lymphocytes Relative: 27 %
Lymphs Abs: 1.8 K/uL (ref 0.7–4.0)
MCH: 23.4 pg — ABNORMAL LOW (ref 26.0–34.0)
MCHC: 30.9 g/dL (ref 30.0–36.0)
MCV: 75.8 fL — ABNORMAL LOW (ref 80.0–100.0)
Monocytes Absolute: 0.4 K/uL (ref 0.1–1.0)
Monocytes Relative: 6 %
Neutro Abs: 3.9 K/uL (ref 1.7–7.7)
Neutrophils Relative %: 58 %
Platelets: 348 K/uL (ref 150–400)
RBC: 5 MIL/uL (ref 3.87–5.11)
RDW: 13.5 % (ref 11.5–15.5)
WBC: 6.8 K/uL (ref 4.0–10.5)
nRBC: 0 % (ref 0.0–0.2)

## 2023-11-04 LAB — COMPREHENSIVE METABOLIC PANEL WITH GFR
ALT: 44 U/L (ref 0–44)
AST: 35 U/L (ref 15–41)
Albumin: 3.2 g/dL — ABNORMAL LOW (ref 3.5–5.0)
Alkaline Phosphatase: 140 U/L — ABNORMAL HIGH (ref 38–126)
Anion gap: 16 — ABNORMAL HIGH (ref 5–15)
BUN: 9 mg/dL (ref 6–20)
CO2: 21 mmol/L — ABNORMAL LOW (ref 22–32)
Calcium: 9.1 mg/dL (ref 8.9–10.3)
Chloride: 97 mmol/L — ABNORMAL LOW (ref 98–111)
Creatinine, Ser: 0.7 mg/dL (ref 0.44–1.00)
GFR, Estimated: >60 mL/min (ref >60)
Glucose, Bld: 98 mg/dL (ref 70–99)
Potassium: 3.8 mmol/L (ref 3.5–5.1)
Sodium: 134 mmol/L — ABNORMAL LOW (ref 135–145)
Total Bilirubin: 1.3 mg/dL — ABNORMAL HIGH (ref 0.0–1.2)
Total Protein: 7.3 g/dL (ref 6.5–8.1)

## 2023-11-04 LAB — RESP PANEL BY RT-PCR (RSV, FLU A&B, COVID)  RVPGX2
Influenza A by PCR: NEGATIVE
Influenza B by PCR: NEGATIVE
Resp Syncytial Virus by PCR: NEGATIVE
SARS Coronavirus 2 by RT PCR: NEGATIVE

## 2023-11-04 LAB — IRON AND TIBC
Iron: 10 ug/dL — ABNORMAL LOW (ref 28–170)
Saturation Ratios: 3 % — ABNORMAL LOW (ref 10.4–31.8)
TIBC: 323 ug/dL (ref 250–450)
UIBC: 313 ug/dL

## 2023-11-04 LAB — FERRITIN: Ferritin: 184 ng/mL (ref 11–307)

## 2023-11-04 LAB — I-STAT CG4 LACTIC ACID, ED
Lactic Acid, Venous: 2 mmol/L (ref 0.5–1.9)
Lactic Acid, Venous: 1 mmol/L (ref 0.5–1.9)

## 2023-11-04 LAB — PROTIME-INR
INR: 1.1 (ref 0.8–1.2)
Prothrombin Time: 14.4 s (ref 11.4–15.2)

## 2023-11-04 LAB — HCG, SERUM, QUALITATIVE: Preg, Serum: NEGATIVE

## 2023-11-04 MED ORDER — LACTATED RINGERS IV BOLUS (SEPSIS)
1000.0000 mL | Freq: Once | INTRAVENOUS | Status: AC
  Administered 2023-11-04: 1000 mL via INTRAVENOUS

## 2023-11-04 MED ORDER — SODIUM CHLORIDE 0.9 % IV SOLN
2.0000 g | Freq: Three times a day (TID) | INTRAVENOUS | Status: DC
  Administered 2023-11-04 – 2023-11-13 (×27): 2 g via INTRAVENOUS
  Filled 2023-11-04 (×28): qty 12.5

## 2023-11-04 MED ORDER — SODIUM CHLORIDE 0.9 % IV SOLN
2.0000 g | Freq: Once | INTRAVENOUS | Status: AC
  Administered 2023-11-04: 2 g via INTRAVENOUS
  Filled 2023-11-04: qty 20

## 2023-11-04 MED ORDER — LACTATED RINGERS IV BOLUS
500.0000 mL | Freq: Once | INTRAVENOUS | Status: AC
  Administered 2023-11-04: 500 mL via INTRAVENOUS

## 2023-11-04 MED ORDER — VANCOMYCIN HCL 1250 MG/250ML IV SOLN
1250.0000 mg | Freq: Two times a day (BID) | INTRAVENOUS | Status: DC
  Administered 2023-11-05 – 2023-11-09 (×9): 1250 mg via INTRAVENOUS
  Filled 2023-11-04 (×12): qty 250

## 2023-11-04 MED ORDER — ONDANSETRON HCL 4 MG/2ML IJ SOLN
4.0000 mg | Freq: Once | INTRAMUSCULAR | Status: AC
  Administered 2023-11-04: 4 mg via INTRAVENOUS
  Filled 2023-11-04: qty 2

## 2023-11-04 MED ORDER — SODIUM CHLORIDE 0.9 % IV SOLN: 2.0000 g | Freq: Once | INTRAVENOUS | Status: DC

## 2023-11-04 MED ORDER — ONDANSETRON HCL 4 MG/2ML IJ SOLN
4.0000 mg | Freq: Four times a day (QID) | INTRAMUSCULAR | Status: DC | PRN
  Administered 2023-11-04 – 2023-11-12 (×3): 4 mg via INTRAVENOUS
  Filled 2023-11-04 (×2): qty 2

## 2023-11-04 MED ORDER — METRONIDAZOLE 500 MG/100ML IV SOLN
500.0000 mg | Freq: Two times a day (BID) | INTRAVENOUS | Status: DC
  Administered 2023-11-04 – 2023-11-06 (×4): 500 mg via INTRAVENOUS
  Filled 2023-11-04 (×4): qty 100

## 2023-11-04 MED ORDER — LACTATED RINGERS IV SOLN: INTRAVENOUS | Status: DC

## 2023-11-04 MED ORDER — VANCOMYCIN HCL IN DEXTROSE 1-5 GM/200ML-% IV SOLN: 1000.0000 mg | Freq: Once | INTRAVENOUS | Status: DC

## 2023-11-04 MED ORDER — ACETAMINOPHEN 325 MG PO TABS
650.0000 mg | ORAL_TABLET | Freq: Once | ORAL | Status: AC
  Administered 2023-11-04: 650 mg via ORAL
  Filled 2023-11-04 (×2): qty 2

## 2023-11-04 MED ORDER — VANCOMYCIN HCL 2000 MG/400ML IV SOLN
2000.0000 mg | Freq: Once | INTRAVENOUS | Status: AC
  Administered 2023-11-04: 2000 mg via INTRAVENOUS
  Filled 2023-11-04: qty 400

## 2023-11-04 MED ORDER — HYDROMORPHONE HCL 1 MG/ML IJ SOLN
2.0000 mg | INTRAMUSCULAR | Status: DC | PRN
  Administered 2023-11-04 – 2023-11-05 (×7): 2 mg via INTRAVENOUS
  Filled 2023-11-04 (×7): qty 2

## 2023-11-04 MED ORDER — HYDROMORPHONE HCL 1 MG/ML IJ SOLN
1.0000 mg | Freq: Once | INTRAMUSCULAR | Status: AC
  Administered 2023-11-04: 1 mg via INTRAVENOUS
  Filled 2023-11-04: qty 1

## 2023-11-04 MED ORDER — HYDROXYZINE HCL 25 MG PO TABS
25.0000 mg | ORAL_TABLET | Freq: Three times a day (TID) | ORAL | Status: DC | PRN
  Administered 2023-11-09 – 2023-11-12 (×2): 25 mg via ORAL
  Filled 2023-11-04 (×2): qty 1

## 2023-11-04 MED ORDER — ACETAMINOPHEN 650 MG RE SUPP: 650.0000 mg | Freq: Four times a day (QID) | RECTAL | Status: DC | PRN

## 2023-11-04 MED ORDER — BUPRENORPHINE HCL-NALOXONE HCL 8-2 MG SL SUBL
1.0000 | SUBLINGUAL_TABLET | Freq: Three times a day (TID) | SUBLINGUAL | Status: DC
  Administered 2023-11-04 – 2023-11-13 (×25): 1 via SUBLINGUAL
  Filled 2023-11-04 (×26): qty 1

## 2023-11-04 MED ORDER — LACTATED RINGERS IV BOLUS (SEPSIS)
600.0000 mL | Freq: Once | INTRAVENOUS | Status: AC
  Administered 2023-11-04: 600 mL via INTRAVENOUS

## 2023-11-04 MED ORDER — GABAPENTIN 300 MG PO CAPS
300.0000 mg | ORAL_CAPSULE | Freq: Three times a day (TID) | ORAL | Status: DC
  Administered 2023-11-04 – 2023-11-13 (×25): 300 mg via ORAL
  Filled 2023-11-04 (×26): qty 1

## 2023-11-04 MED ORDER — ONDANSETRON HCL 4 MG PO TABS: 4.0000 mg | ORAL_TABLET | Freq: Four times a day (QID) | ORAL | Status: DC | PRN

## 2023-11-04 MED ORDER — ACETAMINOPHEN 325 MG PO TABS
650.0000 mg | ORAL_TABLET | Freq: Four times a day (QID) | ORAL | Status: DC | PRN
  Administered 2023-11-04 – 2023-11-05 (×3): 650 mg via ORAL
  Filled 2023-11-04 (×3): qty 2
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
## 2023-11-04 NOTE — Assessment & Plan Note (Addendum)
-   CT left hip shows large abscess left hip measuring 18.5 cm transverse by 7.2 cm AP with surrounding stranding.  No gas identified and no obvious erosive changes or osteolysis to suggest OM involving left iliac bone -Orthopedic surgery following; patient underwent further debridement of large abscess on 10/10 with [place] placement  - follow up OR cultures to help guide abx; might still need ID input depending on cultures - continue vanc/cefepime /flagyl  for now - Plan is for repeat surgery on Wednesday tentatively

## 2023-11-04 NOTE — ED Notes (Signed)
 IV flushed by RN with difficulty. Patient's infusions paused by RN and primary RN notified. Area above peripheral IV with swelling and cool to touch.

## 2023-11-04 NOTE — Assessment & Plan Note (Addendum)
-   Prior history noted of leaving AMA multiple times - noted having poor attitude/behavior to staff also since admission also  - pleasant and cooperative since surgery

## 2023-11-04 NOTE — Sepsis Progress Note (Signed)
 eLink is following this Code Sepsis.
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
## 2023-11-04 NOTE — Assessment & Plan Note (Signed)
-   Database reviewed, continue Suboxone
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
## 2023-11-04 NOTE — Assessment & Plan Note (Deleted)
 -  No home meds noted ?
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
## 2023-11-04 NOTE — Assessment & Plan Note (Signed)
Continue Atarax ?

## 2023-11-04 NOTE — ED Notes (Signed)
 At [facility] RN noted her IV site was swollen, cold and painful for pt. Kate Larock RN aware at [employer], discussed with Pharmacist Jon about plan of care going forward. 250 ml of vancomycin  given to pt from IV but time of infiltration unknown. At this time Pharmacist suggest administering  a cold compress and monitoring for further symptoms of pain, swelling or redness and to immediately contact pharmacy if these are noted.

## 2023-11-04 NOTE — Assessment & Plan Note (Addendum)
-   likely chronic and possibly some acute worsening in setting of left hip abscess, poor pain tolerance in general given opioid dependence and some psychosomatic components - not pursuing further imaging at this time - Last CT A/P from 07/11/2023 reviewed.  Mostly negative and limited evaluation of acute pelvic fracture due to motion artifact at that time
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
## 2023-11-04 NOTE — Progress Notes (Signed)
 Pharmacy Antibiotic Note  [name] is a 28 y.o. female admitted on 11/04/2023 with hip abscess.  Pharmacy has been consulted for vancomycin  dosing.  Vancomycin  2g IV x 1 given in ED  Plan: Vancomycin  1250 mg IV q 12h (eAUC 511) Monitor renal function, Cx and clinical progression to narrow Vancomycin  levels as indicated  Height: 5' 3 (160 cm) Weight: 127 kg (280 lb) IBW/kg (Calculated) : 52.4  Temp (24hrs), Avg:101.2 F (38.4 C), Min:101.1 F (38.4 C), Max:101.3 F (38.5 C)  Recent Labs  Lab 11/04/23 1349 11/04/23 1406 11/04/23 1610  WBC 6.8  --   --   CREATININE 0.70  --   --   LATICACIDVEN  --  2.0* 1.0    Estimated Creatinine Clearance: 137.1 mL/min (by C-G formula based on SCr of 0.7 mg/dL).    Allergies  Allergen Reactions   Blueberry Fruit Extract Anaphylaxis   Contrast Media [Iodinated Contrast Media] Anaphylaxis, Hives and Rash   Omnipaque  [Iohexol ] Hives, Itching, Nausea And Vomiting and Swelling   Codeine Hives, Itching and Nausea And Vomiting   Latex Hives and Other (See Comments)    Welts, also   Robaxin  [Methocarbamol ] Hives    Dorn Poot, PharmD, [facility] Clinical Pharmacist ED Pharmacist Phone # [phone] 11/04/2023 4:31 PM
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
## 2023-11-04 NOTE — Progress Notes (Signed)
 Ortho Note  Patient well known to me. We have been dealing with infected [name] lesion initially and subsequently cleared the infection but was managing the large fluid collection with vac changes. She left AMA after one of the admissions and was lost to follow up with retained wound vac sponge. We ended up being able to get the dead space managed and discontinued the wound vac. Unfortunately she developed recurrent infection and fevers spiking to 103. I have reviewed the CT scan and will likely plan for I&D on Friday. I have also reached out to Dr. Harden who may get involved next week. I will evaluate the patient tomorrow AM.  Franky MYRTIS Light, MD [facility] [phone] (office) [email or web address]

## 2023-11-05 DIAGNOSIS — S71002A Unspecified open wound, left hip, initial encounter: Secondary | ICD-10-CM

## 2023-11-05 DIAGNOSIS — R6520 Severe sepsis without septic shock: Secondary | ICD-10-CM | POA: Diagnosis not present

## 2023-11-05 DIAGNOSIS — A419 Sepsis, unspecified organism: Secondary | ICD-10-CM | POA: Diagnosis not present

## 2023-11-05 LAB — CBC
HCT: 32.5 % — ABNORMAL LOW (ref 36.0–46.0)
Hemoglobin: 10.1 g/dL — ABNORMAL LOW (ref 12.0–15.0)
MCH: 23.8 pg — ABNORMAL LOW (ref 26.0–34.0)
MCHC: 31.1 g/dL (ref 30.0–36.0)
MCV: 76.7 fL — ABNORMAL LOW (ref 80.0–100.0)
Platelets: 267 K/uL (ref 150–400)
RBC: 4.24 MIL/uL (ref 3.87–5.11)
RDW: 13.7 % (ref 11.5–15.5)
WBC: 4.8 K/uL (ref 4.0–10.5)
nRBC: 0 % (ref 0.0–0.2)

## 2023-11-05 LAB — MAGNESIUM: Magnesium: 2 mg/dL (ref 1.7–2.4)

## 2023-11-05 LAB — BASIC METABOLIC PANEL WITH GFR
Anion gap: 13 (ref 5–15)
BUN: 9 mg/dL (ref 6–20)
CO2: 22 mmol/L (ref 22–32)
Calcium: 7.9 mg/dL — ABNORMAL LOW (ref 8.9–10.3)
Chloride: 98 mmol/L (ref 98–111)
Creatinine, Ser: 0.6 mg/dL (ref 0.44–1.00)
GFR, Estimated: >60 mL/min (ref >60)
Glucose, Bld: 100 mg/dL — ABNORMAL HIGH (ref 70–99)
Potassium: 3.5 mmol/L (ref 3.5–5.1)
Sodium: 133 mmol/L — ABNORMAL LOW (ref 135–145)

## 2023-11-05 LAB — CORTISOL-AM, BLOOD: Cortisol - AM: 23.1 ug/dL — ABNORMAL HIGH (ref 6.7–22.6)

## 2023-11-05 LAB — PHOSPHORUS: Phosphorus: 2.6 mg/dL (ref 2.5–4.6)

## 2023-11-05 LAB — PROTIME-INR
INR: 1.1 (ref 0.8–1.2)
Prothrombin Time: 14.9 s (ref 11.4–15.2)

## 2023-11-05 LAB — LACTIC ACID, PLASMA: Lactic Acid, Venous: 0.8 mmol/L (ref 0.5–1.9)

## 2023-11-05 MED ORDER — SODIUM CHLORIDE 0.9% FLUSH
3.0000 mL | Freq: Two times a day (BID) | INTRAVENOUS | Status: DC
  Administered 2023-11-05 – 2023-11-13 (×10): 3 mL via INTRAVENOUS

## 2023-11-05 MED ORDER — OXYCODONE HCL 5 MG PO TABS
5.0000 mg | ORAL_TABLET | ORAL | Status: DC | PRN
  Administered 2023-11-05 – 2023-11-12 (×3): 5 mg via ORAL
  Filled 2023-11-05 (×3): qty 1

## 2023-11-05 MED ORDER — IBUPROFEN 200 MG PO TABS
600.0000 mg | ORAL_TABLET | Freq: Four times a day (QID) | ORAL | Status: DC | PRN
  Administered 2023-11-05 (×2): 600 mg via ORAL
  Filled 2023-11-05: qty 3
  Filled 2023-11-05: qty 1

## 2023-11-05 MED ORDER — OXYCODONE HCL 5 MG PO TABS
10.0000 mg | ORAL_TABLET | ORAL | Status: DC | PRN
  Administered 2023-11-05 – 2023-11-10 (×9): 10 mg via ORAL
  Filled 2023-11-05 (×10): qty 2

## 2023-11-05 MED ORDER — FAMOTIDINE 20 MG PO TABS
20.0000 mg | ORAL_TABLET | Freq: Two times a day (BID) | ORAL | Status: DC | PRN
  Administered 2023-11-06 – 2023-11-12 (×3): 20 mg via ORAL
  Filled 2023-11-05 (×3): qty 1

## 2023-11-05 MED ORDER — KETOROLAC TROMETHAMINE 15 MG/ML IJ SOLN
15.0000 mg | Freq: Four times a day (QID) | INTRAMUSCULAR | Status: AC
  Administered 2023-11-05 – 2023-11-06 (×5): 15 mg via INTRAVENOUS
  Filled 2023-11-05 (×5): qty 1

## 2023-11-05 MED ORDER — ALUM & MAG HYDROXIDE-SIMETH 200-200-20 MG/5ML PO SUSP
30.0000 mL | Freq: Once | ORAL | Status: AC
  Administered 2023-11-05: 30 mL via ORAL
  Filled 2023-11-05: qty 30

## 2023-11-05 MED ORDER — HYDROMORPHONE HCL 1 MG/ML IJ SOLN
2.0000 mg | INTRAMUSCULAR | Status: DC | PRN
  Administered 2023-11-05 – 2023-11-08 (×10): 2 mg via INTRAVENOUS
  Filled 2023-11-05 (×10): qty 2

## 2023-11-05 NOTE — H&P (View-Only) (Signed)
 Ortho Trauma Note  Patient in a ton of pain. Asking for improvement to pain regimen. Laying with leg off bed to assist with pain, cannot lie on her hip due to pain. Hip worsened and pain worsened since I saw her on Tuesday.  PE: Fluctuant and tender area over lateral hip consistent with underlying fluid collection, previous wound with minimal drainage. Pain with movement of the hip  Imaging: CT scan show large collection, possible abscess extending to pelvis  A/P Left infected hip fluid collection  Patient will need extensive I&D, will plan for tomorrow. NPO after midnight. Will need continued IV antibiotics and narrowing after cultures tomorrow. I adjusted her pain meds and added PRN oxycodone  5-10 mg and IV toradal to assist with pain.  Franky MYRTIS Light, MD [facility] [phone] (office) [email or web address]

## 2023-11-05 NOTE — Progress Notes (Signed)
 Ortho Trauma Note  Patient in a ton of pain. Asking for improvement to pain regimen. Laying with leg off bed to assist with pain, cannot lie on her hip due to pain. Hip worsened and pain worsened since I saw her on Tuesday.  PE: Fluctuant and tender area over lateral hip consistent with underlying fluid collection, previous wound with minimal drainage. Pain with movement of the hip  Imaging: CT scan show large collection, possible abscess extending to pelvis  A/P Left infected hip fluid collection  Patient will need extensive I&D, will plan for tomorrow. NPO after midnight. Will need continued IV antibiotics and narrowing after cultures tomorrow. I adjusted her pain meds and added PRN oxycodone  5-10 mg and IV toradal to assist with pain.  Franky MYRTIS Light, MD [facility] [phone] (office) [email or web address]
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
## 2023-11-05 NOTE — Plan of Care (Signed)
  Problem: Clinical Measurements: Goal: Signs and symptoms of infection will decrease Outcome: Progressing   Problem: Respiratory: Goal: Ability to maintain adequate ventilation will improve Outcome: Progressing   Problem: Education: Goal: Knowledge of General Education information will improve Description: Including pain rating scale, medication(s)/side effects and non-pharmacologic comfort measures Outcome: Progressing

## 2023-11-06 ENCOUNTER — Encounter (HOSPITAL_COMMUNITY)
Admission: EM | Discharge status: Against Medical Advice | Payer: Self-pay | Source: Home / Self Care | Attending: Internal Medicine

## 2023-11-06 ENCOUNTER — Other Ambulatory Visit: Payer: Self-pay

## 2023-11-06 ENCOUNTER — Inpatient Hospital Stay (HOSPITAL_COMMUNITY): Payer: MEDICAID | Admitting: Certified Registered"

## 2023-11-06 ENCOUNTER — Encounter (HOSPITAL_COMMUNITY): Payer: Self-pay | Admitting: Internal Medicine

## 2023-11-06 DIAGNOSIS — L02416 Cutaneous abscess of left lower limb: Secondary | ICD-10-CM

## 2023-11-06 DIAGNOSIS — Z6841 Body Mass Index (BMI) 40.0 and over, adult: Secondary | ICD-10-CM

## 2023-11-06 DIAGNOSIS — R6520 Severe sepsis without septic shock: Secondary | ICD-10-CM | POA: Diagnosis not present

## 2023-11-06 DIAGNOSIS — A419 Sepsis, unspecified organism: Secondary | ICD-10-CM | POA: Diagnosis not present

## 2023-11-06 HISTORY — PX: INCISION AND DRAINAGE ABSCESS: SHX5864

## 2023-11-06 HISTORY — PX: APPLICATION OF WOUND VAC: SHX5189

## 2023-11-06 LAB — CBC WITH DIFFERENTIAL/PLATELET
Abs Immature Granulocytes: 0.09 K/uL — ABNORMAL HIGH (ref 0.00–0.07)
Basophils Absolute: 0 K/uL (ref 0.0–0.1)
Basophils Relative: 1 %
Eosinophils Absolute: 0.2 K/uL (ref 0.0–0.5)
Eosinophils Relative: 3 %
HCT: 34.1 % — ABNORMAL LOW (ref 36.0–46.0)
Hemoglobin: 10.6 g/dL — ABNORMAL LOW (ref 12.0–15.0)
Immature Granulocytes: 2 %
Lymphocytes Relative: 18 %
Lymphs Abs: 1.1 K/uL (ref 0.7–4.0)
MCH: 23.5 pg — ABNORMAL LOW (ref 26.0–34.0)
MCHC: 31.1 g/dL (ref 30.0–36.0)
MCV: 75.6 fL — ABNORMAL LOW (ref 80.0–100.0)
Monocytes Absolute: 0.7 K/uL (ref 0.1–1.0)
Monocytes Relative: 12 %
Neutro Abs: 3.9 K/uL (ref 1.7–7.7)
Neutrophils Relative %: 64 %
Platelets: 286 K/uL (ref 150–400)
RBC: 4.51 MIL/uL (ref 3.87–5.11)
RDW: 13.9 % (ref 11.5–15.5)
Smear Review: NORMAL
WBC: 6 K/uL (ref 4.0–10.5)
nRBC: 0 % (ref 0.0–0.2)

## 2023-11-06 LAB — BASIC METABOLIC PANEL WITH GFR
Anion gap: 13 (ref 5–15)
BUN: 10 mg/dL (ref 6–20)
CO2: 22 mmol/L (ref 22–32)
Calcium: 8.2 mg/dL — ABNORMAL LOW (ref 8.9–10.3)
Chloride: 101 mmol/L (ref 98–111)
Creatinine, Ser: 0.67 mg/dL (ref 0.44–1.00)
GFR, Estimated: >60 mL/min (ref >60)
Glucose, Bld: 88 mg/dL (ref 70–99)
Potassium: 3.5 mmol/L (ref 3.5–5.1)
Sodium: 136 mmol/L (ref 135–145)

## 2023-11-06 LAB — MAGNESIUM: Magnesium: 2 mg/dL (ref 1.7–2.4)

## 2023-11-06 SURGERY — INCISION AND DRAINAGE, ABSCESS
Anesthesia: General | Site: Hip | Laterality: Left

## 2023-11-06 MED ORDER — LIDOCAINE 2% (20 MG/ML) 5 ML SYRINGE
INTRAMUSCULAR | Status: DC | PRN
  Administered 2023-11-06: 100 mg via INTRAVENOUS

## 2023-11-06 MED ORDER — METOCLOPRAMIDE HCL 5 MG/ML IJ SOLN
5.0000 mg | Freq: Three times a day (TID) | INTRAMUSCULAR | Status: DC | PRN
  Administered 2023-11-07: 10 mg via INTRAVENOUS
  Filled 2023-11-06: qty 2

## 2023-11-06 MED ORDER — METRONIDAZOLE 500 MG PO TABS
500.0000 mg | ORAL_TABLET | Freq: Two times a day (BID) | ORAL | Status: DC
  Administered 2023-11-06 – 2023-11-10 (×8): 500 mg via ORAL
  Filled 2023-11-06 (×8): qty 1

## 2023-11-06 MED ORDER — FENTANYL CITRATE (PF) 250 MCG/5ML IJ SOLN
INTRAMUSCULAR | Status: DC | PRN
  Administered 2023-11-06: 50 ug via INTRAVENOUS
  Administered 2023-11-06: 100 ug via INTRAVENOUS
  Administered 2023-11-06 (×2): 50 ug via INTRAVENOUS
  Administered 2023-11-06 (×2): 100 ug via INTRAVENOUS
  Administered 2023-11-06: 50 ug via INTRAVENOUS

## 2023-11-06 MED ORDER — HYDROMORPHONE HCL 1 MG/ML IJ SOLN
0.2500 mg | INTRAMUSCULAR | Status: DC | PRN
  Administered 2023-11-06: 0.5 mg via INTRAVENOUS

## 2023-11-06 MED ORDER — HYDROMORPHONE HCL 1 MG/ML IJ SOLN
INTRAMUSCULAR | Status: AC
  Filled 2023-11-06: qty 1

## 2023-11-06 MED ORDER — LACTATED RINGERS IV SOLN: INTRAVENOUS | Status: DC

## 2023-11-06 MED ORDER — ONDANSETRON HCL 4 MG/2ML IJ SOLN
INTRAMUSCULAR | Status: AC
  Filled 2023-11-06: qty 2

## 2023-11-06 MED ORDER — ROCURONIUM 10MG/ML (10ML) SYRINGE FOR MEDFUSION PUMP - OPTIME
INTRAVENOUS | Status: DC | PRN
  Administered 2023-11-06: 50 mg via INTRAVENOUS

## 2023-11-06 MED ORDER — ORAL CARE MOUTH RINSE: 15.0000 mL | Freq: Once | OROMUCOSAL | Status: AC

## 2023-11-06 MED ORDER — POLYETHYLENE GLYCOL 3350 17 G PO PACK: 17.0000 g | PACK | Freq: Every day | ORAL | Status: DC | PRN

## 2023-11-06 MED ORDER — OXYCODONE HCL 5 MG/5ML PO SOLN: 5.0000 mg | Freq: Once | ORAL | Status: AC | PRN

## 2023-11-06 MED ORDER — ROCURONIUM BROMIDE 10 MG/ML (PF) SYRINGE
PREFILLED_SYRINGE | INTRAVENOUS | Status: AC
Start: 2023-11-06 — End: 2023-11-06
  Filled 2023-11-06: qty 10

## 2023-11-06 MED ORDER — FENTANYL CITRATE (PF) 250 MCG/5ML IJ SOLN
INTRAMUSCULAR | Status: AC
  Filled 2023-11-06: qty 5

## 2023-11-06 MED ORDER — 0.9 % SODIUM CHLORIDE (POUR BTL) OPTIME
TOPICAL | Status: DC | PRN
Start: 2023-11-06 — End: 2023-11-06
  Administered 2023-11-06: 1000 mL

## 2023-11-06 MED ORDER — AMISULPRIDE (ANTIEMETIC) 5 MG/2ML IV SOLN: 10.0000 mg | Freq: Once | INTRAVENOUS | Status: DC | PRN

## 2023-11-06 MED ORDER — OXYCODONE HCL 5 MG PO TABS
ORAL_TABLET | ORAL | Status: AC
  Filled 2023-11-06: qty 1

## 2023-11-06 MED ORDER — HYDROMORPHONE HCL 1 MG/ML IJ SOLN
INTRAMUSCULAR | Status: DC | PRN
  Administered 2023-11-06: .5 mg via INTRAVENOUS
  Administered 2023-11-06: 1 mg via INTRAVENOUS
  Administered 2023-11-06: .5 mg via INTRAVENOUS

## 2023-11-06 MED ORDER — CHLORHEXIDINE GLUCONATE 0.12 % MT SOLN
15.0000 mL | Freq: Once | OROMUCOSAL | Status: AC
  Administered 2023-11-06: 15 mL via OROMUCOSAL

## 2023-11-06 MED ORDER — MIDAZOLAM HCL 2 MG/2ML IJ SOLN
INTRAMUSCULAR | Status: AC
  Filled 2023-11-06: qty 2

## 2023-11-06 MED ORDER — SUGAMMADEX SODIUM 200 MG/2ML IV SOLN
INTRAVENOUS | Status: DC | PRN
  Administered 2023-11-06: 400 mg via INTRAVENOUS

## 2023-11-06 MED ORDER — DEXMEDETOMIDINE HCL IN NACL 80 MCG/20ML IV SOLN
INTRAVENOUS | Status: DC | PRN
  Administered 2023-11-06: 10 ug via INTRAVENOUS
  Administered 2023-11-06: 8 ug via INTRAVENOUS
  Administered 2023-11-06: 12 ug via INTRAVENOUS
  Administered 2023-11-06: 10 ug via INTRAVENOUS

## 2023-11-06 MED ORDER — LIDOCAINE 2% (20 MG/ML) 5 ML SYRINGE
INTRAMUSCULAR | Status: AC
  Filled 2023-11-06: qty 5

## 2023-11-06 MED ORDER — ONDANSETRON HCL 4 MG/2ML IJ SOLN
INTRAMUSCULAR | Status: DC | PRN
  Administered 2023-11-06: 4 mg via INTRAVENOUS

## 2023-11-06 MED ORDER — PROPOFOL 10 MG/ML IV BOLUS
INTRAVENOUS | Status: AC
  Filled 2023-11-06: qty 20

## 2023-11-06 MED ORDER — PROPOFOL 10 MG/ML IV BOLUS
INTRAVENOUS | Status: DC | PRN
  Administered 2023-11-06: 200 mg via INTRAVENOUS

## 2023-11-06 MED ORDER — MIDAZOLAM HCL 2 MG/2ML IJ SOLN
INTRAMUSCULAR | Status: DC | PRN
  Administered 2023-11-06: 2 mg via INTRAVENOUS

## 2023-11-06 MED ORDER — DEXAMETHASONE SOD PHOSPHATE PF 10 MG/ML IJ SOLN
INTRAMUSCULAR | Status: DC | PRN
Start: 2023-11-06 — End: 2023-11-06
  Administered 2023-11-06: 10 mg via INTRAVENOUS

## 2023-11-06 MED ORDER — HYDROMORPHONE HCL 1 MG/ML IJ SOLN
INTRAMUSCULAR | Status: AC
  Filled 2023-11-06: qty 0.5

## 2023-11-06 MED ORDER — METOCLOPRAMIDE HCL 5 MG PO TABS
5.0000 mg | ORAL_TABLET | Freq: Three times a day (TID) | ORAL | Status: DC | PRN
  Administered 2023-11-08 (×2): 10 mg via ORAL
  Filled 2023-11-06 (×2): qty 2

## 2023-11-06 MED ORDER — OXYCODONE HCL 5 MG PO TABS
5.0000 mg | ORAL_TABLET | Freq: Once | ORAL | Status: AC | PRN
  Administered 2023-11-06: 5 mg via ORAL

## 2023-11-06 MED ORDER — SODIUM CHLORIDE 0.9 % IR SOLN
Status: DC | PRN
  Administered 2023-11-06 (×2): 3000 mL

## 2023-11-06 MED ORDER — DOCUSATE SODIUM 100 MG PO CAPS
100.0000 mg | ORAL_CAPSULE | Freq: Two times a day (BID) | ORAL | Status: DC
  Administered 2023-11-06 – 2023-11-08 (×2): 100 mg via ORAL
  Filled 2023-11-06 (×13): qty 1

## 2023-11-06 MED ORDER — ENOXAPARIN SODIUM 40 MG/0.4ML IJ SOSY
40.0000 mg | PREFILLED_SYRINGE | INTRAMUSCULAR | Status: DC
  Administered 2023-11-07 – 2023-11-10 (×4): 40 mg via SUBCUTANEOUS
  Filled 2023-11-06 (×4): qty 0.4

## 2023-11-06 MED ORDER — ACETAMINOPHEN 500 MG PO TABS
1000.0000 mg | ORAL_TABLET | Freq: Once | ORAL | Status: AC
  Administered 2023-11-06: 1000 mg via ORAL
  Filled 2023-11-06: qty 2

## 2023-11-06 SURGICAL SUPPLY — 44 items
BAG COUNTER SPONGE SURGICOUNT (BAG) ×2 IMPLANT
BNDG COHESIVE 4X5 TAN STRL LF (GAUZE/BANDAGES/DRESSINGS) ×2 IMPLANT
BNDG GAUZE DERMACEA FLUFF 4 (GAUZE/BANDAGES/DRESSINGS) ×4 IMPLANT
BRUSH SCRUB EZ PLAIN DRY (MISCELLANEOUS) ×4 IMPLANT
CANISTER WOUNDNEG PRESSURE 500 (CANNISTER) IMPLANT
CHLORAPREP W/TINT 26 (MISCELLANEOUS) ×2 IMPLANT
COVER MAYO STAND STRL (DRAPES) ×2 IMPLANT
COVER SURGICAL LIGHT HANDLE (MISCELLANEOUS) ×4 IMPLANT
DRAPE DERMATAC (DRAPES) IMPLANT
DRAPE SURG 17X23 STRL (DRAPES) ×2 IMPLANT
DRAPE SURG ORHT 6 SPLT 77X108 (DRAPES) ×2 IMPLANT
DRAPE U-SHAPE 47X51 STRL (DRAPES) ×2 IMPLANT
DRSG ADAPTIC 3X8 NADH LF (GAUZE/BANDAGES/DRESSINGS) ×2 IMPLANT
DRSG VAC GRANUFOAM LG (GAUZE/BANDAGES/DRESSINGS) IMPLANT
ELECTRODE REM PT RTRN 9FT ADLT (ELECTROSURGICAL) IMPLANT
EVACUATOR 1/8 PVC DRAIN (DRAIN) IMPLANT
GAUZE SPONGE 4X4 12PLY STRL (GAUZE/BANDAGES/DRESSINGS) ×2 IMPLANT
GLOVE BIO SURGEON STRL SZ 6.5 (GLOVE) ×6 IMPLANT
GLOVE BIO SURGEON STRL SZ7.5 (GLOVE) ×8 IMPLANT
GLOVE BIOGEL PI IND STRL 6.5 (GLOVE) ×2 IMPLANT
GLOVE BIOGEL PI IND STRL 7.5 (GLOVE) ×2 IMPLANT
GOWN STRL REUS W/ TWL LRG LVL3 (GOWN DISPOSABLE) ×4 IMPLANT
KIT BASIN OR (CUSTOM PROCEDURE TRAY) ×2 IMPLANT
KIT TURNOVER KIT B (KITS) ×2 IMPLANT
MANIFOLD NEPTUNE II (INSTRUMENTS) ×2 IMPLANT
PACK ORTHO EXTREMITY (CUSTOM PROCEDURE TRAY) ×2 IMPLANT
PAD ARMBOARD POSITIONER FOAM (MISCELLANEOUS) ×4 IMPLANT
PADDING CAST COTTON 6X4 STRL (CAST SUPPLIES) ×2 IMPLANT
SET HNDPC FAN SPRY TIP SCT (DISPOSABLE) IMPLANT
SOLN 0.9% NACL 1000 ML (IV SOLUTION) ×2 IMPLANT
SOLN 0.9% NACL POUR BTL 1000ML (IV SOLUTION) ×2 IMPLANT
SOLN STERILE WATER 1000 ML (IV SOLUTION) ×2 IMPLANT
SOLN STERILE WATER BTL 1000 ML (IV SOLUTION) ×2 IMPLANT
SPONGE T-LAP 18X18 ~~LOC~~+RFID (SPONGE) ×2 IMPLANT
SUT ETHILON 2 0 FS 18 (SUTURE) ×4 IMPLANT
SUT ETHILON 3 0 PS 1 (SUTURE) ×4 IMPLANT
SUT MON AB 2-0 CT1 36 (SUTURE) ×2 IMPLANT
SUT PDS AB 0 CT 36 (SUTURE) IMPLANT
SWAB CULTURE ESWAB REG 1ML (MISCELLANEOUS) IMPLANT
TOWEL GREEN STERILE (TOWEL DISPOSABLE) ×4 IMPLANT
TOWEL GREEN STERILE FF (TOWEL DISPOSABLE) ×2 IMPLANT
TUBE CONNECTING 12X1/4 (SUCTIONS) ×2 IMPLANT
UNDERPAD 30X36 HEAVY ABSORB (UNDERPADS AND DIAPERS) ×2 IMPLANT
YANKAUER SUCT BULB TIP NO VENT (SUCTIONS) ×2 IMPLANT

## 2023-11-06 NOTE — Anesthesia Procedure Notes (Signed)
 Procedure Name: Intubation Date/Time: 11/06/2023 10:13 AM  Performed by: Nanci Riis, CRNAPre-anesthesia Checklist: Patient identified, Emergency Drugs available, Suction available and Patient being monitored Patient Re-evaluated:Patient Re-evaluated prior to induction Oxygen Delivery Method: Circle system utilized Preoxygenation: Pre-oxygenation with 100% oxygen Induction Type: IV induction Ventilation: Mask ventilation without difficulty Laryngoscope Size: Miller and 3 Grade View: Grade I Tube type: Oral Tube size: 7.0 mm Number of attempts: 1 Airway Equipment and Method: Stylet Placement Confirmation: ETT inserted through vocal cords under direct vision, positive ETCO2 and breath sounds checked- equal and bilateral Secured at: 21 cm Tube secured with: Tape Dental Injury: Teeth and Oropharynx as per pre-operative assessment

## 2023-11-06 NOTE — Transfer of Care (Signed)
 Immediate Anesthesia Transfer of Care Note  Patient: [name]  Procedure(s) Performed: INCISION AND DRAINAGE, ABSCESS HIP (Left) APPLICATION, WOUND VAC (Left: Hip)  Patient Location: PACU  Anesthesia Type:General  Level of Consciousness: drowsy and patient cooperative  Airway & Oxygen Therapy: Patient Spontanous Breathing  Post-op Assessment: Report given to RN and Post -op Vital signs reviewed and stable  Post vital signs: Reviewed and stable  Last Vitals:  Vitals Value Taken Time  BP 116/78 11/06/23 11:00  Temp    Pulse 120 11/06/23 11:01  Resp 43 11/06/23 11:01  SpO2 95 % 11/06/23 11:01  Vitals shown include unfiled device data.  Last Pain:  Vitals:   11/06/23 0825  TempSrc: Oral  PainSc: 10-Worst pain ever      Patients Stated Pain Goal: 0 (11/06/23 0754)  Complications: No notable events documented.

## 2023-11-06 NOTE — Anesthesia Postprocedure Evaluation (Signed)
 Anesthesia Post Note  Patient: [name]  Procedure(s) Performed: INCISION AND DRAINAGE, ABSCESS HIP (Left) APPLICATION, WOUND VAC (Left: Hip)     Patient location during evaluation: PACU Anesthesia Type: General Level of consciousness: awake and alert Pain management: pain level controlled Vital Signs Assessment: post-procedure vital signs reviewed and stable Respiratory status: spontaneous breathing, nonlabored ventilation, respiratory function stable and patient connected to nasal cannula oxygen Cardiovascular status: blood pressure returned to baseline and stable Postop Assessment: no apparent nausea or vomiting Anesthetic complications: no   No notable events documented.  Last Vitals:  Vitals:   11/06/23 1314 11/06/23 1450  BP: (!) 106/56 113/75  Pulse:  90  Resp: 19 15  Temp:  37 C  SpO2:  95%    Last Pain:  Vitals:   11/06/23 1450  TempSrc: Oral  PainSc:                  Camron Monday L Reno Clasby

## 2023-11-06 NOTE — Interval H&P Note (Signed)
 History and Physical Interval Note:  11/06/2023 9:27 AM  [name]  has presented today for surgery, with the diagnosis of Left hip infection.  The various methods of treatment have been discussed with the patient and family. After consideration of risks, benefits and other options for treatment, the patient has consented to  Procedure(s): INCISION AND DRAINAGE, ABSCESS HIP (Left) as a surgical intervention.  The patient's history has been reviewed, patient examined, no change in status, stable for surgery.  I have reviewed the patient's chart and labs.  Questions were answered to the patient's satisfaction.     Elford Evilsizer P Ishmeal Rorie

## 2023-11-06 NOTE — Op Note (Signed)
 Orthopaedic Surgery Operative Note (CSN: [id number] ) Date of Surgery: 11/06/2023  Admit Date: 11/04/2023   Diagnoses: Pre-Op Diagnoses: Left hip abscess  Post-Op Diagnosis: Same  Procedures: CPT 26990-Incision and drainage of left hip abscess CPT 97606-Wound vac placement to left hip  Surgeons : Primary: Kendal Franky SQUIBB, MD  Assistant: [name], PA-C  Location: OR 3   Anesthesia: General   Antibiotics: Scheduled IV antibiotics   Tourniquet time: None    Estimated Blood Loss: 50 mL  Complications:* No complications entered in OR log *   Specimens: ID Type Source Tests Collected by Time Destination  A : left hip abscess A Abscess Abscess AEROBIC/ANAEROBIC CULTURE W GRAM STAIN (SURGICAL/DEEP WOUND) Kendal Franky SQUIBB, MD 11/06/2023 1028   B : left hip abscess B Abscess Abscess AEROBIC/ANAEROBIC CULTURE W GRAM STAIN (SURGICAL/DEEP WOUND) Kendal Franky SQUIBB, MD 11/06/2023 1029   C : left hip abscess C Abscess Abscess AEROBIC/ANAEROBIC CULTURE W GRAM STAIN (SURGICAL/DEEP WOUND) Kendal Franky SQUIBB, MD 11/06/2023 1029      Implants: * No implants in log *   Indications for Surgery: 28 year old female who had a large degloving injury to her left hip with a open iliac wing fracture.  She underwent I&D and ORIF initially with subsequent [place]ella [place] lesion that had developed.  We were conservatively managing this but she started to have a infection and we ended up proceeding with an I&D.  We are managing her with wound vacs for the dead space management but unfortunately she was lost to follow-up for nearly 6 weeks about 3 months ago.  The wound VAC sponge then grew into the soft tissues.  We had to take her to remove this and we were managing her wound and infection that she developed.  She appeared to have cleared her infection and improved her wound to the point where we did not have to use a wound VAC any further.  Unfortunately she developed worsening swelling and pain and high-grade  fevers.  She was admitted CT scan showed a large fluid collection in the area of her previous Nella G[place] lesion.  I felt that she was indicated for I&D of her hip.  Risks and benefits were discussed with the patient and her mother.  They agreed to proceed with surgery and consent was obtained.  Operative Findings: 1.  Large purulent fluid collection in the area of the previous Morel involved lesion.  Multiple culture sent for speciation to direct antibiotic therapy. 2.  Unable to palpate the iliac wing through the wound.  However her soft tissue envelope makes it difficult to fully appreciate the depth of the abscess.  Procedure: The patient was identified in the preoperative holding area. Consent was confirmed with the patient and their family and all questions were answered. The operative extremity was marked after confirmation with the patient. she was then brought back to the operating room by our anesthesia colleagues.  She was placed under general anesthetic and carefully transferred over to radiolucent flattop table.  A bump was placed under her operative hip.  The left lower extremity was then prepped and draped in usual sterile fashion.  A timeout was performed verifying patient, the procedure, and the extremity.  Preoperative antibiotics had already been given.  The previous wound had drained significantly.  We extended this laterally into the large fluid collection.  Here we encountered significant amount of purulent fluid.  I sent 3 culture cups for micro.  I then was able to suction the remainder  of it.  There is no visible foreign body that was present there is no retained sponge that I could visualize.  I used a Cobb elevator and rongeur to debride the soft tissues.  There is no significant necrosis of the fat or muscle.  I then used low-pressure pulsatile lavage to thoroughly irrigate the wound with 6 L of normal saline.  I then placed a large black granular foam sponge into the wound and  connected it to 125 mmHg.  Good seal was obtained.  The patient was then awoke from anesthesia and taken to the PACU in stable condition.   Debridement type: Excisional Debridement  Side: left  Body Location: Hip  Tools used for debridement: rongeur  Pre-debridement Wound size (cm):   Length: 4        Width: 2     Depth: 7   Post-debridement Wound size (cm):   Length: 10        Width: 7     Depth: 20   Debridement depth beyond dead/damaged tissue down to healthy viable tissue: yes  Tissue layer involved: skin, subcutaneous tissue, muscle / fascia  Nature of tissue removed: Purulence  Irrigation volume: 6L     Irrigation fluid type: Normal Saline   Post Op Plan/Instructions: Patient will be weightbearing as tolerated to the left lower extremity.  She will receive broad-spectrum antibiotics with hopeful narrowing of the coverage.  I will reach out to Dr. Harden to potentially have him assist with definitive management of the dead space.  We may plan for repeat wound VAC change Monday or Wednesday.  Patient will be placed on Lovenox  for DVT prophylaxis.  I was present and performed the entire surgery.  [name], PA-C did assist me throughout the case. An assistant was necessary given the difficulty in approach, maintenance of reduction and ability to instrument the fracture.   Franky Light, MD [facility]
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
## 2023-11-06 NOTE — Anesthesia Preprocedure Evaluation (Addendum)
 Anesthesia Evaluation  Patient identified by MRN, date of birth, ID band Patient awake    Reviewed: Allergy & Precautions, NPO status , Patient's Chart, lab work & pertinent test results  Airway Mallampati: III  TM Distance: >3 FB Neck ROM: Full    Dental  (+) Missing, Poor Dentition, Chipped, Dental Advisory Given   Pulmonary Current Smoker and Patient abstained from smoking.   Pulmonary exam normal breath sounds clear to auscultation       Cardiovascular negative cardio ROS Normal cardiovascular exam Rhythm:Regular Rate:Normal     Neuro/Psych  Headaches PSYCHIATRIC DISORDERS Anxiety Depression       GI/Hepatic ,GERD  ,,(+)     substance abuse (on suboxone )  cocaine use  Endo/Other    Class 4 obesity (BMI 50)  Renal/GU negative Renal ROS  negative genitourinary   Musculoskeletal negative musculoskeletal ROS (+)  narcotic dependent  Abdominal   Peds  (+) ADHD Hematology negative hematology ROS (+)   Anesthesia Other Findings   Reproductive/Obstetrics                              Anesthesia Physical Anesthesia Plan  ASA: 3  Anesthesia Plan: General   Post-op Pain Management: Tylenol  PO (pre-op)*, Precedex  and Dilaudid  IV   Induction: Intravenous  PONV Risk Score and Plan: 2 and Midazolam , Dexamethasone  and Ondansetron   Airway Management Planned: Oral ETT  Additional Equipment:   Intra-op Plan:   Post-operative Plan: Extubation in OR  Informed Consent: I have reviewed the patients History and Physical, chart, labs and discussed the procedure including the risks, benefits and alternatives for the proposed anesthesia with the patient or authorized representative who has indicated his/her understanding and acceptance.     Dental advisory given  Plan Discussed with: CRNA  Anesthesia Plan Comments:          Anesthesia Quick Evaluation

## 2023-11-06 NOTE — Progress Notes (Signed)
 Orthopedic Tech Progress Note Patient Details:  [name] 1995/01/31 [id number]  Patient ID: Geni GORMAN Cedar, female   DOB: 03/23/1995, 28 y.o.   MRN: [id number] No OHF, Weight >250 Adine MARLA Blush 11/06/2023, 12:56 PM

## 2023-11-07 ENCOUNTER — Encounter (HOSPITAL_COMMUNITY): Payer: Self-pay | Admitting: Student

## 2023-11-07 DIAGNOSIS — L02416 Cutaneous abscess of left lower limb: Secondary | ICD-10-CM | POA: Diagnosis not present

## 2023-11-07 DIAGNOSIS — R6520 Severe sepsis without septic shock: Secondary | ICD-10-CM | POA: Diagnosis not present

## 2023-11-07 DIAGNOSIS — A419 Sepsis, unspecified organism: Secondary | ICD-10-CM | POA: Diagnosis not present

## 2023-11-07 LAB — BASIC METABOLIC PANEL WITH GFR
Anion gap: 11 (ref 5–15)
BUN: 12 mg/dL (ref 6–20)
CO2: 21 mmol/L — ABNORMAL LOW (ref 22–32)
Calcium: 8.3 mg/dL — ABNORMAL LOW (ref 8.9–10.3)
Chloride: 105 mmol/L (ref 98–111)
Creatinine, Ser: 0.6 mg/dL (ref 0.44–1.00)
GFR, Estimated: >60 mL/min (ref >60)
Glucose, Bld: 201 mg/dL — ABNORMAL HIGH (ref 70–99)
Potassium: 3.5 mmol/L (ref 3.5–5.1)
Sodium: 137 mmol/L (ref 135–145)

## 2023-11-07 LAB — CBC WITH DIFFERENTIAL/PLATELET
Basophils Absolute: 0 K/uL (ref 0.0–0.1)
Basophils Relative: 0 %
Eosinophils Absolute: 0 K/uL (ref 0.0–0.5)
Eosinophils Relative: 0 %
HCT: 28.1 % — ABNORMAL LOW (ref 36.0–46.0)
Hemoglobin: 8.6 g/dL — ABNORMAL LOW (ref 12.0–15.0)
Lymphocytes Relative: 10 %
Lymphs Abs: 0.6 K/uL — ABNORMAL LOW (ref 0.7–4.0)
MCH: 23.2 pg — ABNORMAL LOW (ref 26.0–34.0)
MCHC: 30.6 g/dL (ref 30.0–36.0)
MCV: 75.9 fL — ABNORMAL LOW (ref 80.0–100.0)
Monocytes Absolute: 0.2 K/uL (ref 0.1–1.0)
Monocytes Relative: 4 %
Neutro Abs: 4.9 K/uL (ref 1.7–7.7)
Neutrophils Relative %: 86 %
Platelets: 309 K/uL (ref 150–400)
RBC: 3.7 MIL/uL — ABNORMAL LOW (ref 3.87–5.11)
RDW: 14.3 % (ref 11.5–15.5)
WBC: 5.7 K/uL (ref 4.0–10.5)
nRBC: 0 % (ref 0.0–0.2)

## 2023-11-07 LAB — MAGNESIUM: Magnesium: 2.2 mg/dL (ref 1.7–2.4)
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
## 2023-11-07 NOTE — Progress Notes (Signed)
 Orthopaedic Progress Note  S: Patient seen sitting at the bedside.  She is comfortable.  O:  Vitals:   11/07/23 0735 11/07/23 0920  BP: 109/73   Pulse: 60   Resp: 20 (!) 23  Temp: (!) 97.5 F (36.4 C)   SpO2: 99%     VAC dressing in place on the left flank.  Holding suction   Culture shows Staph aureus  Labs:  Results for orders placed or performed during the hospital encounter of 11/04/23 (from the past 24 hours)  Basic metabolic panel with GFR     Status: Abnormal   Collection Time: 11/07/23  3:34 AM  Result Value Ref Range   Sodium 137 135 - 145 mmol/L   Potassium 3.5 3.5 - 5.1 mmol/L   Chloride 105 98 - 111 mmol/L   CO2 21 (L) 22 - 32 mmol/L   Glucose, Bld 201 (H) 70 - 99 mg/dL   BUN 12 6 - 20 mg/dL   Creatinine, Ser 9.39 0.44 - 1.00 mg/dL   Calcium  8.3 (L) 8.9 - 10.3 mg/dL   GFR, Estimated >39 >39 mL/min   Anion gap 11 5 - 15  CBC with Differential/Platelet     Status: Abnormal   Collection Time: 11/07/23  3:34 AM  Result Value Ref Range   WBC 5.7 4.0 - 10.5 K/uL   RBC 3.70 (L) 3.87 - 5.11 MIL/uL   Hemoglobin 8.6 (L) 12.0 - 15.0 g/dL   HCT 71.8 (L) 63.9 - 53.9 %   MCV 75.9 (L) 80.0 - 100.0 fL   MCH 23.2 (L) 26.0 - 34.0 pg   MCHC 30.6 30.0 - 36.0 g/dL   RDW 85.6 88.4 - 84.4 %   Platelets 309 150 - 400 K/uL   nRBC 0.0 0.0 - 0.2 %   Neutrophils Relative % 86 %   Neutro Abs 4.9 1.7 - 7.7 K/uL   Lymphocytes Relative 10 %   Lymphs Abs 0.6 (L) 0.7 - 4.0 K/uL   Monocytes Relative 4 %   Monocytes Absolute 0.2 0.1 - 1.0 K/uL   Eosinophils Relative 0 %   Eosinophils Absolute 0.0 0.0 - 0.5 K/uL   Basophils Relative 0 %   Basophils Absolute 0.0 0.0 - 0.1 K/uL   WBC Morphology See Note    RBC Morphology MORPHOLOGY UNREMARKABLE    Smear Review See Note   Magnesium      Status: None   Collection Time: 11/07/23  3:34 AM  Result Value Ref Range   Magnesium  2.2 1.7 - 2.4 mg/dL    Assessment: Status post I&D left hip  Patient is doing well.  The VAC is holding  suction.  I discussed the case with [name], the plan is for repeat surgery likely on Wednesday.  This may be an extension with Dr. Harden.  In the meantime continue with IV antibiotics.  Continue to follow cultures and adjust antibiotics appropriately.  Continue the current VAC dressing.  Injuries: Left flank infection  Weightbearing: Weightbearing as tolerated  Insicional and dressing care: Continue with VAC   Pain management: Continue current regimen   ID: Antibiotics per ID, cultures growing Staph aureus     Cordella Rhein, MD, MS [facility] [phone]
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
## 2023-11-08 DIAGNOSIS — D509 Iron deficiency anemia, unspecified: Secondary | ICD-10-CM | POA: Diagnosis not present

## 2023-11-08 DIAGNOSIS — R6520 Severe sepsis without septic shock: Secondary | ICD-10-CM | POA: Diagnosis not present

## 2023-11-08 DIAGNOSIS — A419 Sepsis, unspecified organism: Secondary | ICD-10-CM | POA: Diagnosis not present

## 2023-11-08 DIAGNOSIS — L02416 Cutaneous abscess of left lower limb: Secondary | ICD-10-CM | POA: Diagnosis not present

## 2023-11-08 LAB — CBC WITH DIFFERENTIAL/PLATELET
Abs Immature Granulocytes: 0.59 K/uL — ABNORMAL HIGH (ref 0.00–0.07)
Basophils Absolute: 0.1 K/uL (ref 0.0–0.1)
Basophils Relative: 1 %
Eosinophils Absolute: 0 K/uL (ref 0.0–0.5)
Eosinophils Relative: 1 %
HCT: 28.6 % — ABNORMAL LOW (ref 36.0–46.0)
Hemoglobin: 8.8 g/dL — ABNORMAL LOW (ref 12.0–15.0)
Immature Granulocytes: 7 %
Lymphocytes Relative: 23 %
Lymphs Abs: 2 K/uL (ref 0.7–4.0)
MCH: 23 pg — ABNORMAL LOW (ref 26.0–34.0)
MCHC: 30.8 g/dL (ref 30.0–36.0)
MCV: 74.9 fL — ABNORMAL LOW (ref 80.0–100.0)
Monocytes Absolute: 0.6 K/uL (ref 0.1–1.0)
Monocytes Relative: 7 %
Neutro Abs: 5.5 K/uL (ref 1.7–7.7)
Neutrophils Relative %: 61 %
Platelets: 427 K/uL — ABNORMAL HIGH (ref 150–400)
RBC: 3.82 MIL/uL — ABNORMAL LOW (ref 3.87–5.11)
RDW: 14.5 % (ref 11.5–15.5)
Smear Review: NORMAL
WBC: 8.8 K/uL (ref 4.0–10.5)
nRBC: 0 % (ref 0.0–0.2)

## 2023-11-08 NOTE — Progress Notes (Signed)
 Pt wound vac full. Changed and disposed of full wound vac at 1830. New wound vac checked for lines and airleaks, settings kept as prior. Melinda DEL Johnson10/12/25 6:43 PM
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
## 2023-11-08 NOTE — Plan of Care (Signed)
  Problem: Fluid Volume: Goal: Hemodynamic stability will improve Outcome: Progressing   Problem: Clinical Measurements: Goal: Diagnostic test results will improve Outcome: Progressing Goal: Signs and symptoms of infection will decrease Outcome: Progressing   Problem: Education: Goal: Knowledge of General Education information will improve Description: Including pain rating scale, medication(s)/side effects and non-pharmacologic comfort measures Outcome: Progressing   Problem: Health Behavior/Discharge Planning: Goal: Ability to manage health-related needs will improve Outcome: Progressing   Problem: Clinical Measurements: Goal: Ability to maintain clinical measurements within normal limits will improve Outcome: Progressing Goal: Will remain free from infection Outcome: Progressing Goal: Diagnostic test results will improve Outcome: Progressing Goal: Respiratory complications will improve Outcome: Progressing Goal: Cardiovascular complication will be avoided Outcome: Progressing   Problem: Activity: Goal: Risk for activity intolerance will decrease Outcome: Progressing   Problem: Nutrition: Goal: Adequate nutrition will be maintained Outcome: Progressing   Problem: Coping: Goal: Level of anxiety will decrease Outcome: Progressing

## 2023-11-08 NOTE — Progress Notes (Signed)
 Orthopaedic Progress Note  S: Patient is sitting in bed resting comfortably  O:  Vitals:   11/08/23 0417 11/08/23 0823  BP: 118/68 110/63  Pulse: 99 93  Resp:    Temp: 98 F (36.7 C) 97.6 F (36.4 C)  SpO2: 100% 100%    VAC dressing in place on left flank  Cultures are showing methicillin resistant Staph aureus  Labs:  Results for orders placed or performed during the hospital encounter of 11/04/23 (from the past 24 hours)  CBC with Differential/Platelet     Status: Abnormal   Collection Time: 11/08/23  3:11 AM  Result Value Ref Range   WBC 8.8 4.0 - 10.5 K/uL   RBC 3.82 (L) 3.87 - 5.11 MIL/uL   Hemoglobin 8.8 (L) 12.0 - 15.0 g/dL   HCT 71.3 (L) 63.9 - 53.9 %   MCV 74.9 (L) 80.0 - 100.0 fL   MCH 23.0 (L) 26.0 - 34.0 pg   MCHC 30.8 30.0 - 36.0 g/dL   RDW 85.4 88.4 - 84.4 %   Platelets 427 (H) 150 - 400 K/uL   nRBC 0.0 0.0 - 0.2 %   Neutrophils Relative % 61 %   Neutro Abs 5.5 1.7 - 7.7 K/uL   Lymphocytes Relative 23 %   Lymphs Abs 2.0 0.7 - 4.0 K/uL   Monocytes Relative 7 %   Monocytes Absolute 0.6 0.1 - 1.0 K/uL   Eosinophils Relative 1 %   Eosinophils Absolute 0.0 0.0 - 0.5 K/uL   Basophils Relative 1 %   Basophils Absolute 0.1 0.0 - 0.1 K/uL   WBC Morphology MORPHOLOGY UNREMARKABLE    RBC Morphology MORPHOLOGY UNREMARKABLE    Smear Review Normal platelet morphology    Immature Granulocytes 7 %   Abs Immature Granulocytes 0.59 (H) 0.00 - 0.07 K/uL    Assessment: Status post I&D left flank infection  The patient's cultures are growing MRSA.  She will continue with IV antibiotics per infectious disease.  We will keep the vacuum dressing in place for today.  Plan for a VAC change tomorrow with [name].  Will plan for surgical interventions likely on Wednesday.     Cordella Rhein, MD, MS [facility] [phone]

## 2023-11-09 ENCOUNTER — Other Ambulatory Visit: Payer: Self-pay

## 2023-11-09 ENCOUNTER — Inpatient Hospital Stay (HOSPITAL_COMMUNITY): Payer: MEDICAID | Admitting: Certified Registered"

## 2023-11-09 ENCOUNTER — Encounter (HOSPITAL_COMMUNITY)
Admission: EM | Discharge status: Against Medical Advice | Payer: Self-pay | Source: Home / Self Care | Attending: Internal Medicine

## 2023-11-09 ENCOUNTER — Encounter (HOSPITAL_COMMUNITY): Payer: Self-pay | Admitting: Internal Medicine

## 2023-11-09 DIAGNOSIS — Z6841 Body Mass Index (BMI) 40.0 and over, adult: Secondary | ICD-10-CM

## 2023-11-09 DIAGNOSIS — E66813 Obesity, class 3: Secondary | ICD-10-CM

## 2023-11-09 DIAGNOSIS — S71002A Unspecified open wound, left hip, initial encounter: Secondary | ICD-10-CM

## 2023-11-09 DIAGNOSIS — B9562 Methicillin resistant Staphylococcus aureus infection as the cause of diseases classified elsewhere: Secondary | ICD-10-CM

## 2023-11-09 DIAGNOSIS — F418 Other specified anxiety disorders: Secondary | ICD-10-CM

## 2023-11-09 DIAGNOSIS — L02416 Cutaneous abscess of left lower limb: Secondary | ICD-10-CM | POA: Diagnosis not present

## 2023-11-09 DIAGNOSIS — A419 Sepsis, unspecified organism: Secondary | ICD-10-CM | POA: Diagnosis not present

## 2023-11-09 DIAGNOSIS — T148XXA Other injury of unspecified body region, initial encounter: Secondary | ICD-10-CM | POA: Diagnosis not present

## 2023-11-09 DIAGNOSIS — T8189XA Other complications of procedures, not elsewhere classified, initial encounter: Secondary | ICD-10-CM

## 2023-11-09 DIAGNOSIS — R6520 Severe sepsis without septic shock: Secondary | ICD-10-CM | POA: Diagnosis not present

## 2023-11-09 HISTORY — PX: INCISION AND DRAINAGE OF WOUND: SHX1803

## 2023-11-09 LAB — CBC WITH DIFFERENTIAL/PLATELET
Basophils Absolute: 0.1 K/uL (ref 0.0–0.1)
Basophils Relative: 2 %
Eosinophils Absolute: 0.2 K/uL (ref 0.0–0.5)
Eosinophils Relative: 3 %
HCT: 31.6 % — ABNORMAL LOW (ref 36.0–46.0)
Hemoglobin: 9.5 g/dL — ABNORMAL LOW (ref 12.0–15.0)
Lymphocytes Relative: 26 %
Lymphs Abs: 1.7 K/uL (ref 0.7–4.0)
MCH: 23.1 pg — ABNORMAL LOW (ref 26.0–34.0)
MCHC: 30.1 g/dL (ref 30.0–36.0)
MCV: 76.7 fL — ABNORMAL LOW (ref 80.0–100.0)
Monocytes Absolute: 0.7 K/uL (ref 0.1–1.0)
Monocytes Relative: 11 %
Neutro Abs: 3.7 K/uL (ref 1.7–7.7)
Neutrophils Relative %: 58 %
Platelets: 409 K/uL — ABNORMAL HIGH (ref 150–400)
RBC: 4.12 MIL/uL (ref 3.87–5.11)
RDW: 14.7 % (ref 11.5–15.5)
WBC: 6.4 K/uL (ref 4.0–10.5)
nRBC: 0.9 % — ABNORMAL HIGH (ref 0.0–0.2)

## 2023-11-09 LAB — CULTURE, BLOOD (ROUTINE X 2)
Culture: NO GROWTH
Culture: NO GROWTH

## 2023-11-09 LAB — VANCOMYCIN, PEAK: Vancomycin Pk: 43 ug/mL — ABNORMAL HIGH (ref 30–40)

## 2023-11-09 SURGERY — IRRIGATION AND DEBRIDEMENT WOUND
Anesthesia: General | Site: Hip | Laterality: Left

## 2023-11-09 MED ORDER — ACETAMINOPHEN 10 MG/ML IV SOLN
1000.0000 mg | Freq: Once | INTRAVENOUS | Status: DC | PRN
  Administered 2023-11-09: 1000 mg via INTRAVENOUS

## 2023-11-09 MED ORDER — ROCURONIUM BROMIDE 100 MG/10ML IV SOLN
INTRAVENOUS | Status: DC | PRN
  Administered 2023-11-09: 70 mg via INTRAVENOUS

## 2023-11-09 MED ORDER — AMISULPRIDE (ANTIEMETIC) 5 MG/2ML IV SOLN
10.0000 mg | Freq: Once | INTRAVENOUS | Status: AC
  Administered 2023-11-09: 10 mg via INTRAVENOUS

## 2023-11-09 MED ORDER — PROPOFOL 10 MG/ML IV BOLUS
INTRAVENOUS | Status: DC | PRN
  Administered 2023-11-09: 160 mg via INTRAVENOUS

## 2023-11-09 MED ORDER — HYDROMORPHONE HCL 1 MG/ML IJ SOLN
INTRAMUSCULAR | Status: AC
  Filled 2023-11-09: qty 1

## 2023-11-09 MED ORDER — OXYCODONE HCL 5 MG PO TABS: 5.0000 mg | ORAL_TABLET | Freq: Once | ORAL | Status: DC | PRN

## 2023-11-09 MED ORDER — SODIUM CHLORIDE 0.9 % IR SOLN
Status: DC | PRN
  Administered 2023-11-09: 3000 mL

## 2023-11-09 MED ORDER — OXYCODONE HCL 5 MG/5ML PO SOLN: 5.0000 mg | Freq: Once | ORAL | Status: DC | PRN

## 2023-11-09 MED ORDER — SUGAMMADEX SODIUM 200 MG/2ML IV SOLN
INTRAVENOUS | Status: DC | PRN
  Administered 2023-11-09: 500 mg via INTRAVENOUS

## 2023-11-09 MED ORDER — HYDROMORPHONE HCL 1 MG/ML IJ SOLN
0.2500 mg | INTRAMUSCULAR | Status: DC | PRN
  Administered 2023-11-09 (×2): 0.5 mg via INTRAVENOUS

## 2023-11-09 MED ORDER — ACETAMINOPHEN 10 MG/ML IV SOLN
INTRAVENOUS | Status: AC
  Filled 2023-11-09: qty 100

## 2023-11-09 MED ORDER — DEXMEDETOMIDINE HCL IN NACL 80 MCG/20ML IV SOLN
INTRAVENOUS | Status: DC | PRN
Start: 2023-11-09 — End: 2023-11-09
  Administered 2023-11-09: 20 ug via INTRAVENOUS

## 2023-11-09 MED ORDER — LIDOCAINE HCL (CARDIAC) PF 100 MG/5ML IV SOSY
PREFILLED_SYRINGE | INTRAVENOUS | Status: DC | PRN
  Administered 2023-11-09: 60 mg via INTRATRACHEAL

## 2023-11-09 MED ORDER — PROPOFOL 10 MG/ML IV BOLUS
INTRAVENOUS | Status: AC
  Filled 2023-11-09: qty 20

## 2023-11-09 MED ORDER — CHLORHEXIDINE GLUCONATE 0.12 % MT SOLN
15.0000 mL | Freq: Once | OROMUCOSAL | Status: DC
  Filled 2023-11-09: qty 15

## 2023-11-09 MED ORDER — FENTANYL CITRATE (PF) 250 MCG/5ML IJ SOLN
INTRAMUSCULAR | Status: DC | PRN
  Administered 2023-11-09: 100 ug via INTRAVENOUS
  Administered 2023-11-09: 150 ug via INTRAVENOUS

## 2023-11-09 MED ORDER — 0.9 % SODIUM CHLORIDE (POUR BTL) OPTIME
TOPICAL | Status: DC | PRN
  Administered 2023-11-09: 1000 mL

## 2023-11-09 MED ORDER — ORAL CARE MOUTH RINSE: 15.0000 mL | Freq: Once | OROMUCOSAL | Status: DC

## 2023-11-09 MED ORDER — ONDANSETRON HCL 4 MG/2ML IJ SOLN
INTRAMUSCULAR | Status: DC | PRN
  Administered 2023-11-09: 4 mg via INTRAVENOUS

## 2023-11-09 MED ORDER — VANCOMYCIN VARIABLE DOSE PER UNSTABLE RENAL FUNCTION (PHARMACIST DOSING): Status: DC

## 2023-11-09 MED ORDER — DEXAMETHASONE SOD PHOSPHATE PF 10 MG/ML IJ SOLN
INTRAMUSCULAR | Status: DC | PRN
  Administered 2023-11-09: 10 mg via INTRAVENOUS

## 2023-11-09 MED ORDER — FENTANYL CITRATE (PF) 250 MCG/5ML IJ SOLN
INTRAMUSCULAR | Status: AC
  Filled 2023-11-09: qty 5

## 2023-11-09 MED ORDER — LACTATED RINGERS IV SOLN: INTRAVENOUS | Status: DC

## 2023-11-09 MED ORDER — VANCOMYCIN HCL 1000 MG IV SOLR
INTRAVENOUS | Status: AC
  Filled 2023-11-09: qty 20

## 2023-11-09 MED ORDER — AMISULPRIDE (ANTIEMETIC) 5 MG/2ML IV SOLN
INTRAVENOUS | Status: AC
  Filled 2023-11-09: qty 4

## 2023-11-09 MED ORDER — MIDAZOLAM HCL 2 MG/2ML IJ SOLN
INTRAMUSCULAR | Status: AC
Start: 2023-11-09 — End: 2023-11-09
  Filled 2023-11-09: qty 2

## 2023-11-09 SURGICAL SUPPLY — 45 items
BAG COUNTER SPONGE SURGICOUNT (BAG) ×1 IMPLANT
BNDG COHESIVE 4X5 TAN STRL LF (GAUZE/BANDAGES/DRESSINGS) ×1 IMPLANT
BNDG GAUZE DERMACEA FLUFF 4 (GAUZE/BANDAGES/DRESSINGS) ×2 IMPLANT
BRUSH SCRUB EZ PLAIN DRY (MISCELLANEOUS) ×2 IMPLANT
CANISTER WOUNDNEG PRESSURE 500 (CANNISTER) IMPLANT
CHLORAPREP W/TINT 26 (MISCELLANEOUS) ×1 IMPLANT
COVER MAYO STAND STRL (DRAPES) ×1 IMPLANT
COVER SURGICAL LIGHT HANDLE (MISCELLANEOUS) ×2 IMPLANT
DRAPE INCISE IOBAN 66X45 STRL (DRAPES) IMPLANT
DRAPE SURG 17X23 STRL (DRAPES) ×1 IMPLANT
DRAPE SURG ORHT 6 SPLT 77X108 (DRAPES) ×1 IMPLANT
DRAPE U-SHAPE 47X51 STRL (DRAPES) ×1 IMPLANT
DRSG ADAPTIC 3X8 NADH LF (GAUZE/BANDAGES/DRESSINGS) ×1 IMPLANT
DRSG VAC GRANUFOAM LG (GAUZE/BANDAGES/DRESSINGS) IMPLANT
ELECTRODE REM PT RTRN 9FT ADLT (ELECTROSURGICAL) IMPLANT
EVACUATOR 1/8 PVC DRAIN (DRAIN) IMPLANT
GAUZE SPONGE 4X4 12PLY STRL (GAUZE/BANDAGES/DRESSINGS) ×1 IMPLANT
GLOVE BIO SURGEON STRL SZ 6.5 (GLOVE) ×3 IMPLANT
GLOVE BIO SURGEON STRL SZ7.5 (GLOVE) ×4 IMPLANT
GLOVE BIOGEL PI IND STRL 6.5 (GLOVE) ×1 IMPLANT
GLOVE BIOGEL PI IND STRL 7.5 (GLOVE) ×1 IMPLANT
GOWN STRL REUS W/ TWL LRG LVL3 (GOWN DISPOSABLE) ×2 IMPLANT
KIT BASIN OR (CUSTOM PROCEDURE TRAY) ×1 IMPLANT
KIT TURNOVER KIT B (KITS) ×1 IMPLANT
MANIFOLD NEPTUNE II (INSTRUMENTS) ×1 IMPLANT
PACK GENERAL/GYN (CUSTOM PROCEDURE TRAY) IMPLANT
PACK ORTHO EXTREMITY (CUSTOM PROCEDURE TRAY) ×1 IMPLANT
PAD ARMBOARD POSITIONER FOAM (MISCELLANEOUS) ×2 IMPLANT
PADDING CAST COTTON 6X4 STRL (CAST SUPPLIES) ×1 IMPLANT
SET HNDPC FAN SPRY TIP SCT (DISPOSABLE) IMPLANT
SOLN 0.9% NACL 1000 ML (IV SOLUTION) ×1 IMPLANT
SOLN 0.9% NACL POUR BTL 1000ML (IV SOLUTION) ×1 IMPLANT
SOLN STERILE WATER 1000 ML (IV SOLUTION) ×1 IMPLANT
SOLN STERILE WATER BTL 1000 ML (IV SOLUTION) ×1 IMPLANT
SPONGE T-LAP 18X18 ~~LOC~~+RFID (SPONGE) ×1 IMPLANT
SUT ETHILON 2 0 FS 18 (SUTURE) ×2 IMPLANT
SUT ETHILON 3 0 PS 1 (SUTURE) ×2 IMPLANT
SUT MON AB 2-0 CT1 36 (SUTURE) ×1 IMPLANT
SUT PDS AB 0 CT 36 (SUTURE) IMPLANT
SWAB CULTURE ESWAB REG 1ML (MISCELLANEOUS) IMPLANT
TOWEL GREEN STERILE (TOWEL DISPOSABLE) ×2 IMPLANT
TOWEL GREEN STERILE FF (TOWEL DISPOSABLE) ×1 IMPLANT
TUBE CONNECTING 12X1/4 (SUCTIONS) ×1 IMPLANT
UNDERPAD 30X36 HEAVY ABSORB (UNDERPADS AND DIAPERS) ×1 IMPLANT
YANKAUER SUCT BULB TIP NO VENT (SUCTIONS) ×1 IMPLANT
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
## 2023-11-09 NOTE — Transfer of Care (Signed)
 Immediate Anesthesia Transfer of Care Note  Patient: [name]  Procedure(s) Performed: IRRIGATION AND DEBRIDEMENT WOUND (Left: Hip)  Patient Location: PACU  Anesthesia Type:General  Level of Consciousness: awake, alert , and oriented  Airway & Oxygen Therapy: Patient Spontanous Breathing and Patient connected to nasal cannula oxygen  Post-op Assessment: Report given to RN and Post -op Vital signs reviewed and stable  Post vital signs: Reviewed and stable  Last Vitals:  Vitals Value Taken Time  BP 135/72 11/09/23 09:51  Temp 36.6 C 11/09/23 09:51  Pulse 88 11/09/23 09:55  Resp 7 11/09/23 09:55  SpO2 99 % 11/09/23 09:55  Vitals shown include unfiled device data.  Last Pain:  Vitals:   11/09/23 0738  TempSrc: Oral  PainSc: 7       Patients Stated Pain Goal: 0 (11/07/23 1925)  Complications: No notable events documented.
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
## 2023-11-09 NOTE — Progress Notes (Signed)
 0800 Pt's IV is infiltrated. Pt is a very hard stick. CRNA tried but not successful. Dr Leopoldo notified.

## 2023-11-09 NOTE — Anesthesia Preprocedure Evaluation (Signed)
 Anesthesia Evaluation  Patient identified by MRN, date of birth, ID band Patient awake    Reviewed: Allergy & Precautions, NPO status , Patient's Chart, lab work & pertinent test results  History of Anesthesia Complications Negative for: history of anesthetic complications  Airway Mallampati: III  TM Distance: >3 FB Neck ROM: Full    Dental  (+) Dental Advisory Given, Chipped   Pulmonary Current Smoker and Patient abstained from smoking.   breath sounds clear to auscultation       Cardiovascular negative cardio ROS  Rhythm:Regular     Neuro/Psych  Headaches PSYCHIATRIC DISORDERS Anxiety Depression       GI/Hepatic Neg liver ROS,GERD  ,,  Endo/Other    Class 3 obesity  Renal/GU negative Renal ROS     Musculoskeletal negative musculoskeletal ROS (+)    Abdominal   Peds  Hematology  (+) Blood dyscrasia, anemia Lab Results      Component                Value               Date                      WBC                      6.4                 11/09/2023                HGB                      9.5 (L)             11/09/2023                HCT                      31.6 (L)            11/09/2023                MCV                      76.7 (L)            11/09/2023                PLT                      409 (H)             11/09/2023              Anesthesia Other Findings   Reproductive/Obstetrics                              Anesthesia Physical Anesthesia Plan  ASA: 3  Anesthesia Plan:    Post-op Pain Management: Ofirmev  IV (intra-op)*, Toradol  IV (intra-op)* and Ketamine  IV*   Induction: Inhalational  PONV Risk Score and Plan: 2 and Ondansetron  and Dexamethasone   Airway Management Planned: Oral ETT  Additional Equipment: None  Intra-op Plan:   Post-operative Plan: Extubation in OR  Informed Consent: I have reviewed the patients History and Physical, chart, labs and discussed  the procedure including the risks, benefits and alternatives for the proposed anesthesia with the patient or authorized  representative who has indicated his/her understanding and acceptance.     Dental advisory given  Plan Discussed with: CRNA  Anesthesia Plan Comments:         Anesthesia Quick Evaluation

## 2023-11-09 NOTE — Interval H&P Note (Signed)
 History and Physical Interval Note:  11/09/2023 8:34 AM  [name] [place]  has presented today for surgery, with the diagnosis of LEFT HIP WOUND.  The various methods of treatment have been discussed with the patient and family. After consideration of risks, benefits and other options for treatment, the patient has consented to  Procedure(s) with comments: IRRIGATION AND DEBRIDEMENT WOUND (Left) - LEFT HIP as a surgical intervention.  The patient's history has been reviewed, patient examined, no change in status, stable for surgery.  I have reviewed the patient's chart and labs.  Questions were answered to the patient's satisfaction.     Krissia Schreier P Ameya Kutz
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
## 2023-11-09 NOTE — Progress Notes (Signed)
 PT Cancellation Note  Patient Details Name: [name] MRN: [id number] DOB: 1995-05-22   Cancelled Treatment:    Reason Eval/Treat Not Completed: (P) Patient at procedure or test/unavailable (pt at OR for I&D.) Will continue efforts per PT plan of care as schedule permits.   Connell HERO Gaylin Osoria 11/09/2023, 9:16 AM

## 2023-11-09 NOTE — Anesthesia Procedure Notes (Addendum)
 Procedure Name: Intubation Date/Time: 11/09/2023 9:20 AM  Performed by: Lynnetta Darrel Lebron Mickey., CRNAPre-anesthesia Checklist: Patient identified, Patient being monitored, Timeout performed, Emergency Drugs available and Suction available Patient Re-evaluated:Patient Re-evaluated prior to induction Oxygen Delivery Method: Circle system utilized Preoxygenation: Pre-oxygenation with 100% oxygen Induction Type: Inhalational induction Ventilation: Mask ventilation without difficulty Laryngoscope Size: Mac and 3 Grade View: Grade I Tube type: Oral Tube size: 7.0 mm Number of attempts: 1 Airway Equipment and Method: Stylet Placement Confirmation: ETT inserted through vocal cords under direct vision, positive ETCO2 and breath sounds checked- equal and bilateral Secured at: 21 cm Tube secured with: Tape Dental Injury: Teeth and Oropharynx as per pre-operative assessment

## 2023-11-09 NOTE — Consult Note (Signed)
 ORTHOPAEDIC CONSULTATION  REQUESTING PHYSICIAN: Patsy Lenis, MD  Chief Complaint: Left hip wound.  HPI: [name] is a 28 y.o. 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  Patient is status post debridement with [name] and placement of a wound VAC sponge.  Most recent wound cultures are showing MRSA.  Past Medical History:  Diagnosis Date   Accidental overdose 04/13/2022   ADHD (attention deficit hyperactivity disorder)    Anemia    Anesthesia complication    woke up fighting after tonsillectomy   Anxiety    Cholecystitis    Cocaine abuse (HCC)    Complication of anesthesia    when wakes up  freaks out- like panic attack   Depression    Dysmenorrhea    Endometriosis    GAD (generalized anxiety disorder) 11/16/2018   GERD (gastroesophageal reflux disease)    Headache    migraines   Hyperlipidemia    Hypoglycemia    Involuntary commitment 11/18/2022   Major depressive disorder 11/16/2018   MDD (major depressive disorder) 12/09/2012   Morbid obesity with BMI of 40.0-44.9, adult (HCC)    Opioid withdrawal (HCC) 09/12/2022   Polysubstance abuse (HCC)    Pregnancy complicated by subutex  maintenance, antepartum (HCC)    Severe benzodiazepine use disorder (HCC)    Severe opioid use disorder (HCC)    Substance induced mood disorder (HCC)    Syncope    Tobacco abuse    Viral warts    hand   Vision abnormalities    wears glasses   Past Surgical History:  Procedure Laterality Date   ADENOIDECTOMY     APPLICATION OF WOUND VAC Left 07/03/2023   Procedure: APPLICATION, WOUND VAC;  Surgeon: Kendal Franky SQUIBB, MD;  Location: [facility];  Service: Orthopedics;  Laterality: Left;   APPLICATION OF WOUND VAC Left 07/06/2023   Procedure: WOUND VAC EXCHANGE;  Surgeon: Kendal Franky SQUIBB, MD;  Location: [facility];  Service: Orthopedics;  Laterality: Left;   APPLICATION OF WOUND VAC Left 08/26/2023   Procedure: APPLICATION, WOUND VAC HIP;   Surgeon: Kendal Franky SQUIBB, MD;  [facility];  Service: Orthopedics;  Laterality: Left;   APPLICATION OF WOUND VAC Left 09/04/2023   Procedure: APPLICATION, WOUND VAC LEFT HIP;  Surgeon: Kendal Franky SQUIBB, MD;  [facility];  Service: Orthopedics;  Laterality: Left;   APPLICATION OF WOUND VAC Left 09/11/2023   Procedure: APPLICATION, WOUND VAC;  Surgeon: Kendal Franky SQUIBB, MD;  [facility];  Service: Orthopedics;  Laterality: Left;  WOUND VAC PLACEMENT   APPLICATION OF WOUND VAC Left 09/18/2023   Procedure: APPLICATION, WOUND VAC HIP;  Surgeon: Kendal Franky SQUIBB, MD;  [facility];  Service: Orthopedics;  Laterality: Left;   APPLICATION OF WOUND VAC Left 09/25/2023   Procedure: WOUND VAC CHANGE;  Surgeon: Kendal Franky SQUIBB, MD;  [facility];  Service: Orthopedics;  Laterality: Left;   APPLICATION OF WOUND VAC Left 11/06/2023   Procedure: APPLICATION, WOUND VAC;  Surgeon: Kendal Franky SQUIBB, MD;  [facility];  Service: Orthopedics;  Laterality: Left;   BLADDER REPAIR N/A 04/20/2023   Procedure: REPAIR, BLADDER;  Surgeon: Stevie Herlene Righter, MD;  Location: [facility] OR;  Service: General;  Laterality: N/A;   CHOLECYSTECTOMY  03/21/2011   Procedure: LAPAROSCOPIC CHOLECYSTECTOMY;  Surgeon: Vicenta DELENA Poli, MD;  [facility];  Service: General;  Laterality: N/A;   ESOPHAGOGASTRODUODENOSCOPY  09/26/2011   Procedure: ESOPHAGOGASTRODUODENOSCOPY (EGD);  Surgeon: Fairy  VEAR Gaskins, MD;  [facility];  Service: Gastroenterology;  Laterality: N/A;   INCISION AND DRAINAGE ABSCESS Left 11/06/2023   Procedure: INCISION AND DRAINAGE, ABSCESS HIP;  Surgeon: Kendal Franky SQUIBB, MD;  [facility];  Service: Orthopedics;  Laterality: Left;   INCISION AND DRAINAGE HIP Left 09/04/2023   Procedure: IRRIGATION AND DEBRIDEMENT LEFT HIP;  Surgeon: Kendal Franky SQUIBB, MD;  [facility];  Service: Orthopedics;  Laterality: Left;   INCISION AND DRAINAGE HIP Left 09/25/2023   Procedure: IRRIGATION AND DEBRIDEMENT HIP;   Surgeon: Kendal Franky SQUIBB, MD;  [facility];  Service: Orthopedics;  Laterality: Left;   INCISION AND DRAINAGE OF WOUND N/A 04/20/2023   Procedure: IRRIGATION AND DEBRIDEMENT OF PELVIS AND CLOSURE OF HIP WOUND.;  Surgeon: Stevie Herlene Righter, MD;  [facility];  Service: General;  Laterality: N/A;   INCISION AND DRAINAGE OF WOUND Left 04/23/2023   Procedure: IRRIGATION AND DEBRIDEMENT AND FIXATION OF PELVIC WOUND;  Surgeon: Kendal Franky SQUIBB, MD;  [facility];  Service: Orthopedics;  Laterality: Left;   INCISION AND DRAINAGE OF WOUND Left 06/24/2023   Procedure: IRRIGATION AND DEBRIDEMENT WOUND AND WOUND VAC PLACEMENT HIP;  Surgeon: Kendal Franky SQUIBB, MD;  [facility];  Service: Orthopedics;  Laterality: Left;   INCISION AND DRAINAGE OF WOUND Left 06/26/2023   Procedure: IRRIGATION AND DEBRIDEMENT HIP WITH WOUND VAC CHANGE;  Surgeon: Kendal Franky SQUIBB, MD;  [facility];  Service: Orthopedics;  Laterality: Left;   LAPAROTOMY N/A 04/20/2023   Procedure: LAPAROTOMY, EXPLORATORY AND SPLENIC REPAIR;  Surgeon: Kinsinger, Herlene Righter, MD;  [facility];  Service: General;  Laterality: N/A;   ORIF ANKLE FRACTURE Left 04/23/2023   Procedure: OPEN REDUCTION INTERNAL FIXATION (ORIF) ANKLE FRACTURE;  Surgeon: Kendal Franky SQUIBB, MD;  [facility];  Service: Orthopedics;  Laterality: Left;   ORIF HUMERUS FRACTURE Right 04/23/2023   Procedure: OPEN REDUCTION INTERNAL FIXATION (ORIF) DISTAL HUMERUS FRACTURE;  Surgeon: Kendal Franky SQUIBB, MD;  [facility];  Service: Orthopedics;  Laterality: Right;   TONSILLECTOMY AND ADENOIDECTOMY  06/2005   WISDOM TOOTH EXTRACTION     WOUND EXPLORATION Left 09/30/2023   Procedure: IRRIGATION OF LEFT HIP WOUND WITH WOUND VAC CAHANGE;  Surgeon: Kendal Franky SQUIBB, MD;  [facility];  Service: Orthopedics;  Laterality: Left;   Social History   Socioeconomic History   Marital status: Single    Spouse name: Not on file   Number of children: Not on file   Years of  education: Not on file   Highest education level: Not on file  Occupational History   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  Tobacco Use   Smoking status: Every Day    Current packs/day: 1.00    Average packs/day: 1 pack/day for 4.0 years (4.0 ttl pk-yrs)    Types: Cigarettes   Smokeless tobacco: Former   Tobacco comments:    0.5 PPD  Vaping Use   Vaping status: Former  Substance and Sexual Activity   Alcohol  use: Not Currently    Comment: socially   Drug use: Not Currently    Types: Cocaine, Fentanyl , Marijuana, Heroin, Benzodiazepines   Sexual activity: Not Currently    Birth control/protection: None  Other Topics Concern   Not on file  Social History Narrative   ** Merged History Encounter **       Repeating 9th grade (missed 40 days last year despite homebound instruction)   Social Drivers  of Health   Financial Resource Strain: Not on file  Food Insecurity: No Food Insecurity (11/05/2023)   Hunger Vital Sign    Worried About Running Out of Food in the Last Year: Never true    Ran Out of Food in the Last Year: Never true  Transportation Needs: No Transportation Needs (11/05/2023)   PRAPARE - Administrator, Civil Service (Medical): No    Lack of Transportation (Non-Medical): No  Physical Activity: Not on file  Stress: Not on file  Social Connections: Patient Unable To Answer (07/11/2023)   Social Connection and Isolation Panel    Frequency of Communication with Friends and Family: Patient unable to answer    Frequency of Social Gatherings with Friends and Family: Patient unable to answer    Attends Religious Services: Patient unable to answer    Active Member of Clubs or Organizations: Patient unable to answer    Attends Banker Meetings: Patient unable to answer    Marital Status: Patient unable to answer   Family History  Problem Relation Age of Onset   Anesthesia problems Maternal Grandfather    Heart disease Maternal  Grandfather    Nephrolithiasis Maternal Grandfather    Diabetes Maternal Grandfather    Mental illness Maternal Grandfather    Cholelithiasis Mother    Nephrolithiasis Mother    Depression Mother    Hypertension Mother    Miscarriages / Stillbirths Mother    Anxiety disorder Mother    Gout Father    Nephrolithiasis Maternal Grandmother    COPD Maternal Grandmother    Heart disease Paternal Grandfather    Cholelithiasis Maternal Aunt    Depression Maternal Aunt    Learning disabilities Maternal Aunt    Bipolar disorder Sister    - negative except otherwise stated in the family history section Allergies  Allergen Reactions   Blueberry Fruit Extract Anaphylaxis   Contrast Media [Iodinated Contrast Media] Anaphylaxis, Hives and Rash   Omnipaque  [Iohexol ] Hives, Itching, Nausea And Vomiting and Swelling   Codeine Hives, Itching and Nausea And Vomiting   Latex Hives and Other (See Comments)    Welts, also   Robaxin  [Methocarbamol ] Hives   Prior to Admission medications   Medication Sig Start Date End Date Taking? Authorizing Provider  acetaminophen  (TYLENOL ) 500 MG tablet Take 1,000 mg by mouth every 6 (six) hours as needed for mild pain (pain score 1-3).   Yes [provider]  gabapentin  (NEURONTIN ) 300 MG capsule Take 300 mg by mouth 3 (three) times daily. 09/04/23  Yes [provider]  hydrOXYzine  (ATARAX ) 25 MG tablet Take 25 mg by mouth every 8 (eight) hours as needed for anxiety.   Yes [provider]  ondansetron  (ZOFRAN -ODT) 4 MG disintegrating tablet Take 4 mg by mouth every 8 (eight) hours as needed for nausea or vomiting.   Yes [provider]  SUBOXONE  8-2 MG FILM Place 0.5-1 Film under the tongue See admin instructions. 1 film in the morning, 0.5 midday, and 1 film late evening 09/02/23  Yes [provider]  naloxone  (NARCAN ) nasal spray 4 mg/0.1 mL Place 1 spray into the nose daily as needed (overdose). 04/13/23   [provider]   No results found. - pertinent xrays, CT, MRI studies were reviewed and independently interpreted  Positive ROS: All other systems have been reviewed and were otherwise negative with the exception of those mentioned in the HPI and as above.  Physical Exam: General: Alert, no acute distress  Psychiatric: Patient is competent for consent with normal mood and affect Lymphatic: No axillary or cervical lymphadenopathy Cardiovascular: No pedal edema Respiratory: No cyanosis, no use of accessory musculature GI: No organomegaly, abdomen is soft and non-tender    Images:  @ENCIMAGES @  Labs:  Lab Results  Component Value Date   HGBA1C 3.8 (L) 07/11/2023   HGBA1C 5.1 08/26/2020   HGBA1C 5.0 02/14/2020   ESRSEDRATE 4 09/17/2011   CRP 8.5 (H) 07/11/2023   REPTSTATUS PENDING 11/06/2023   REPTSTATUS PENDING 11/06/2023   GRAMSTAIN  11/06/2023    ABUNDANT WBC PRESENT, PREDOMINANTLY PMN MODERATE GRAM POSITIVE COCCI IN CLUSTERS Performed at [facility], [address] [place], [place] [address]    AIMEE  11/06/2023    ABUNDANT WBC PRESENT, PREDOMINANTLY PMN FEW GRAM POSITIVE COCCI IN CLUSTERS Performed at [facility], [address], [place], [place] [address]    CULT  11/06/2023    ABUNDANT STAPHYLOCOCCUS AUREUS SUSCEPTIBILITIES PERFORMED ON PREVIOUS CULTURE WITHIN THE LAST 5 DAYS. NO ANAEROBES ISOLATED; CULTURE IN PROGRESS FOR 5 DAYS    CULT  11/06/2023    ABUNDANT METHICILLIN RESISTANT STAPHYLOCOCCUS AUREUS NO ANAEROBES ISOLATED; CULTURE IN PROGRESS FOR 5 DAYS    LABORGA METHICILLIN RESISTANT STAPHYLOCOCCUS AUREUS 11/06/2023    Lab Results  Component Value Date   ALBUMIN  3.2 (L) 11/04/2023   ALBUMIN  3.6 09/30/2023   ALBUMIN  3.2 (L) 08/26/2023        Latest Ref Rng & Units 11/09/2023    4:01 AM 11/08/2023    3:11 AM 11/07/2023    3:34 AM  CBC EXTENDED  WBC 4.0 - 10.5 K/uL 6.4  8.8  5.7   RBC 3.87 - 5.11 MIL/uL 4.12  3.82  3.70    Hemoglobin 12.0 - 15.0 g/dL 9.5  8.8  8.6   HCT 63.9 - 46.0 % 31.6  28.6  28.1   Platelets 150 - 400 K/uL 409  427  309   NEUT# 1.7 - 7.7 K/uL 3.7  5.5  4.9   Lymph# 0.7 - 4.0 K/uL 1.7  2.0  0.6     Neurologic: Patient does not have protective sensation bilateral lower extremities.   MUSCULOSKELETAL:   Skin: Examination a wound VAC dressing is in place.  Review of her surgical report shows a large deep dead space from the [facility] lavallee lesion with necrotic adipose tissue.  There is no surrounding cellulitis.  Wound cultures positive for MRSA.  Currently on vancomycin  BMI 49.6.  White cell count 6.4, hemoglobin 9.5.  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  Ideally could follow-up with wound VAC treatment and tissue grafting in the office.  Thank you for the consult and the opportunity to see Ms. Lendora Keys, MD [facility] [phone] 5:34 PM

## 2023-11-09 NOTE — Op Note (Signed)
 Orthopaedic Surgery Operative Note (CSN: [id number] ) Date of Surgery: 11/09/2023  Admit Date: 11/04/2023   Diagnoses: Pre-Op Diagnoses: Left hip abscess  Post-Op Diagnosis: Same  Procedures: CPT 11043-Irrigation and debridement of left hip abscess/wound CPT 97606-Wound vac placement  Surgeons : Primary: Kendal Franky SQUIBB, MD  Assistant: [name], PA-C  Location: OR 3   Anesthesia: General   Antibiotics: Scheduled IV antibiotics  Tourniquet time: None    Estimated Blood Loss: Minmal  Complications:* No complications entered in OR log *   Specimens:* No specimens in log *   Implants: * No implants in log *   Indications for Surgery: 28 year old female who has a complex history regarding the left hip abscess.  We underwent I&D on Friday and she was indicated for return to the operating room for wound VAC change and debridement of her abscess.  Risks and benefits were discussed with the patient and her mother.  They agreed to proceed with surgery and consent was obtained.  Operative Findings: 1.  Debridement of left hip wound a total size was 10 cm x 7 cm with 20 cm deep.  No signs of infection.  Wound VAC change to left hip.  Procedure: The patient was identified in the preoperative holding area. Consent was confirmed with the patient and their family and all questions were answered. The operative extremity was marked after confirmation with the patient. she was then brought back to the operating room by our anesthesia colleagues.  She was carefully transferred over to radiolucent flattop table.  She was placed under general anesthetic a bump was placed under her operative hip.  The left hip was then prepped and draped in usual sterile fashion.  Timeout was performed to verify the patient, the procedure, and the extremity.  Preoperative antibiotics had already been given.  I removed the black granular foam sponge.  I then explored the wound which showed no active  infection.  The soft tissue was beefy red and healthy appearing.  I used a Cobb elevator to debride the wound.  I then used pulsatile lavage to thoroughly irrigate the wound with 3 liters of normal saline.  I then placed a black large granular foam sponge into the wound and connected to 125 mmHg.  The patient was awoke from anesthesia and taken to the PACU in stable condition.   Debridement type: Excisional Debridement  Side: left  Body Location: Hip  Tools used for debridement: Cobb elevator  Pre-debridement Wound size (cm):   Length: 10        Width: 7     Depth: 20   Post-debridement Wound size (cm):  Same  Debridement depth beyond dead/damaged tissue down to healthy viable tissue: yes  Tissue layer involved: skin, subcutaneous tissue, muscle / fascia  Nature of tissue removed: Devitalized Tissue  Irrigation volume: 3L     Irrigation fluid type: Normal Saline   Post Op Plan/Instructions: Patient will be weightbearing as tolerated to the left lower extremity.  She will plan to continue with antibiotics per hospitalist service.  I have reached out to Dr. Harden who will plan to take her back on Dec 06, 2023 for reevaluation and dead space management.  I was present and performed the entire surgery.  [name], PA-C did assist me throughout the case. An assistant was necessary given the difficulty in approach, maintenance of reduction and ability to instrument the fracture.   Franky Kendal, MD [facility]
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
## 2023-11-10 ENCOUNTER — Encounter (HOSPITAL_COMMUNITY): Payer: Self-pay | Admitting: Student

## 2023-11-10 DIAGNOSIS — R6520 Severe sepsis without septic shock: Secondary | ICD-10-CM | POA: Diagnosis not present

## 2023-11-10 DIAGNOSIS — L02416 Cutaneous abscess of left lower limb: Secondary | ICD-10-CM | POA: Diagnosis not present

## 2023-11-10 DIAGNOSIS — A419 Sepsis, unspecified organism: Secondary | ICD-10-CM | POA: Diagnosis not present

## 2023-11-10 LAB — CBC WITH DIFFERENTIAL/PLATELET
Abs Immature Granulocytes: 0.69 K/uL — ABNORMAL HIGH (ref 0.00–0.07)
Basophils Absolute: 0.1 K/uL (ref 0.0–0.1)
Basophils Relative: 1 %
Eosinophils Absolute: 0 K/uL (ref 0.0–0.5)
Eosinophils Relative: 1 %
HCT: 31.4 % — ABNORMAL LOW (ref 36.0–46.0)
Hemoglobin: 9.7 g/dL — ABNORMAL LOW (ref 12.0–15.0)
Immature Granulocytes: 10 %
Lymphocytes Relative: 15 %
Lymphs Abs: 1.1 K/uL (ref 0.7–4.0)
MCH: 23.6 pg — ABNORMAL LOW (ref 26.0–34.0)
MCHC: 30.9 g/dL (ref 30.0–36.0)
MCV: 76.4 fL — ABNORMAL LOW (ref 80.0–100.0)
Monocytes Absolute: 0.2 K/uL (ref 0.1–1.0)
Monocytes Relative: 3 %
Neutro Abs: 5.1 K/uL (ref 1.7–7.7)
Neutrophils Relative %: 70 %
Platelets: 439 K/uL — ABNORMAL HIGH (ref 150–400)
RBC: 4.11 MIL/uL (ref 3.87–5.11)
RDW: 14.6 % (ref 11.5–15.5)
Smear Review: NORMAL
WBC: 7.2 K/uL (ref 4.0–10.5)
nRBC: 0 % (ref 0.0–0.2)

## 2023-11-10 LAB — SURGICAL PCR SCREEN
MRSA, PCR: NEGATIVE
Staphylococcus aureus: NEGATIVE

## 2023-11-10 LAB — VANCOMYCIN, TROUGH: Vancomycin Tr: 9 ug/mL — ABNORMAL LOW (ref 15–20)

## 2023-11-10 MED ORDER — ENOXAPARIN SODIUM 60 MG/0.6ML IJ SOSY
60.0000 mg | PREFILLED_SYRINGE | INTRAMUSCULAR | Status: DC
  Administered 2023-11-12 – 2023-11-13 (×2): 60 mg via SUBCUTANEOUS
  Filled 2023-11-10 (×2): qty 0.6

## 2023-11-10 MED ORDER — POVIDONE-IODINE 10 % EX SWAB
2.0000 | Freq: Once | CUTANEOUS | Status: AC
  Administered 2023-11-11: 2 via TOPICAL

## 2023-11-10 MED ORDER — CHLORHEXIDINE GLUCONATE 4 % EX SOLN
60.0000 mL | Freq: Once | CUTANEOUS | Status: AC
  Administered 2023-11-11: 4 via TOPICAL
  Filled 2023-11-10: qty 60

## 2023-11-10 MED ORDER — VANCOMYCIN HCL 750 MG/150ML IV SOLN
750.0000 mg | Freq: Three times a day (TID) | INTRAVENOUS | Status: DC
  Administered 2023-11-10 – 2023-11-12 (×8): 750 mg via INTRAVENOUS
  Filled 2023-11-10 (×9): qty 150

## 2023-11-10 MED ORDER — HYDROMORPHONE HCL 1 MG/ML IJ SOLN
1.0000 mg | INTRAMUSCULAR | Status: AC | PRN
Start: 2023-11-10 — End: ?
  Administered 2023-11-10: 1 mg via INTRAVENOUS
  Filled 2023-11-10: qty 1
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
## 2023-11-10 NOTE — Progress Notes (Signed)
 Orthopaedic Trauma Progress Note  SUBJECTIVE: Patient doing okay this morning.  Pain controlled.  Feeling much better than she did preoperatively on Friday, 11/06/2023.  Was seen by Dr. Harden yesterday afternoon to discuss surgical plans moving forward.  Patient eager to leave the hospital.  No chest pain. No SOB. No nausea/vomiting. No other complaints.  Mom at bedside Intraoperative cultures from 11/06/2023 growing MRSA.  OBJECTIVE:  Vitals:   11/10/23 0740 11/10/23 1147  BP: 124/62 (!) 94/52  Pulse: 72 89  Resp: 17 18  Temp: 97.8 F (36.6 C) 97.7 F (36.5 C)  SpO2:  100%    Opiates Today (MME): Today's  total administered Morphine  Milligram Equivalents: 247.5 Opiates Yesterday (MME): Yesterday's total administered Morphine  Milligram Equivalents: 815  General: Sitting up on edge of bed, no acute distress.  Pleasant and cooperative Respiratory: No increased work of breathing.  Operative Extremity (left lower extremity): Wound VAC in place to the hip/groin.  Good seal and function.  About 100 mL of serosanguineous output in the wound VAC canister.  Motor and sensory function intact distally.  Neurovascular at baseline.   LABS:  Results for orders placed or performed during the hospital encounter of 11/04/23 (from the past 24 hours)  Vancomycin , peak     Status: Abnormal   Collection Time: 11/09/23  3:53 PM  Result Value Ref Range   Vancomycin  Pk 43 (H) 30 - 40 ug/mL  CBC with Differential/Platelet     Status: Abnormal   Collection Time: 11/10/23  3:05 AM  Result Value Ref Range   WBC 7.2 4.0 - 10.5 K/uL   RBC 4.11 3.87 - 5.11 MIL/uL   Hemoglobin 9.7 (L) 12.0 - 15.0 g/dL   HCT 68.5 (L) 63.9 - 53.9 %   MCV 76.4 (L) 80.0 - 100.0 fL   MCH 23.6 (L) 26.0 - 34.0 pg   MCHC 30.9 30.0 - 36.0 g/dL   RDW 85.3 88.4 - 84.4 %   Platelets 439 (H) 150 - 400 K/uL   nRBC 0.0 0.0 - 0.2 %   Neutrophils Relative % 70 %   Neutro Abs 5.1 1.7 - 7.7 K/uL   Lymphocytes Relative 15 %   Lymphs Abs  1.1 0.7 - 4.0 K/uL   Monocytes Relative 3 %   Monocytes Absolute 0.2 0.1 - 1.0 K/uL   Eosinophils Relative 1 %   Eosinophils Absolute 0.0 0.0 - 0.5 K/uL   Basophils Relative 1 %   Basophils Absolute 0.1 0.0 - 0.1 K/uL   WBC Morphology See Note    RBC Morphology MORPHOLOGY UNREMARKABLE    Smear Review Normal platelet morphology    Immature Granulocytes 10 %   Abs Immature Granulocytes 0.69 (H) 0.00 - 0.07 K/uL  Vancomycin , trough     Status: Abnormal   Collection Time: 11/10/23  3:05 AM  Result Value Ref Range   Vancomycin  Tr 9 (L) 15 - 20 ug/mL    ASSESSMENT: [name] is a 28 y.o. female, 1 Day Post-Op s/p IRRIGATION DEBRIDEMENT LEFT HIP WOUND WITH PLACEMENT OF WOUND VAC  CV/Blood loss: Hemoglobin 9.7 this morning, stable from preop.  Hemodynamically stable  PLAN: Weightbearing: WBAT LLE ROM: Unrestricted hip motion as tolerated Incisional and dressing care: Dressings left intact until follow-up  Showering: Hold off on showering while wound VAC in place Orthopedic device(s): Wound VAC LLE Pain management:  1. Tylenol  650 mg q 6 hours scheduled 2. Oxycodone  5-10 mg q 4 hours PRN 3. Dilaudid  1 mg q 3 hours  PRN 4. Suboxone  8-2 mg 3 times daily 5. Gabapentin  300 mg 3 times daily VTE prophylaxis: Lovenox , SCDs ID: Vancomycin  and cefepime  per ID Foley/Lines:  No foley, KVO IVFs Dispo: Plan to return to OR with Dr. Harden tomorrow 11/11/2023 for repeat I&D and placement of Kerecis  Follow - up plan: TBD   Contact information:  Franky Light MD, [name] PA-C. After hours and holidays please check [email or web address] for group call information for [facility]   Lauraine PATRIC Moores, PA-C [phone] (office) [email or web address]
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
## 2023-11-10 NOTE — Progress Notes (Addendum)
 Pharmacy Antibiotic Note  [name] is a 28 y.o. female admitted on 11/04/2023 with hip abscess.  Pharmacy has been consulted for vancomycin  dosing.  Vancomycin  2g IV x 1 given in ED. Now s/p OR debridement with wound vac in place  -Vancomycin  Peak 43, vancomycin  trough 9, cAUC 582 -Ke 0.14, t1/2 5h -sCr ~bl -OR Cx: MRSA   Plan: Change vancomycin  dose to 750 mg IV q8h (eAUC 524, Cmax 33, Cmin 13) Monitor renal function, Cx and clinical progression to narrow Vancomycin  levels as indicated Follow up plans to return to OR Wednesday   Height: 5' 3 (160 cm) Weight: 127 kg (280 lb) IBW/kg (Calculated) : 52.4  Temp (24hrs), Avg:98 F (36.7 C), Min:97.7 F (36.5 C), Max:98.4 F (36.9 C)  Recent Labs  Lab 11/04/23 1349 11/04/23 1406 11/04/23 1610 11/05/23 0657 11/05/23 0658 11/06/23 0320 11/07/23 0334 11/08/23 0311 11/09/23 0401 11/09/23 1553 11/10/23 0305  WBC 6.8  --   --   --  4.8 6.0 5.7 8.8 6.4  --  7.2  CREATININE 0.70  --   --   --  0.60 0.67 0.60  --   --   --   --   LATICACIDVEN  --  2.0* 1.0 0.8  --   --   --   --   --   --   --   VANCOTROUGH  --   --   --   --   --   --   --   --   --   --  9*  VANCOPEAK  --   --   --   --   --   --   --   --   --  43*  --     Estimated Creatinine Clearance: 137.1 mL/min (by C-G formula based on SCr of 0.6 mg/dL).    Allergies  Allergen Reactions   Blueberry Fruit Extract Anaphylaxis   Contrast Media [Iodinated Contrast Media] Anaphylaxis, Hives and Rash   Omnipaque  [Iohexol ] Hives, Itching, Nausea And Vomiting and Swelling   Codeine Hives, Itching and Nausea And Vomiting   Latex Hives and Other (See Comments)    Welts, also   Robaxin  [Methocarbamol ] Hives   Antimicrobials at this Admission 10/8 Vanc>> 10/8 Cefepime >> 10/8 Flagyl  >>  Microbiology Results  10/8 BCx: ngtd 10/10 OR Abscess 3/3: abundant MRSA (S-Vanc, linezolid)  Lynwood Poplar, PharmD, BCPS Clinical Pharmacist 11/10/2023 4:06 AM
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
## 2023-11-11 ENCOUNTER — Inpatient Hospital Stay (HOSPITAL_COMMUNITY): Payer: MEDICAID | Admitting: Anesthesiology

## 2023-11-11 ENCOUNTER — Encounter (HOSPITAL_COMMUNITY): Payer: Self-pay | Admitting: Internal Medicine

## 2023-11-11 ENCOUNTER — Encounter (HOSPITAL_COMMUNITY)
Admission: EM | Discharge status: Against Medical Advice | Payer: Self-pay | Source: Home / Self Care | Attending: Internal Medicine

## 2023-11-11 DIAGNOSIS — T8140XA Infection following a procedure, unspecified, initial encounter: Secondary | ICD-10-CM

## 2023-11-11 DIAGNOSIS — L02416 Cutaneous abscess of left lower limb: Secondary | ICD-10-CM | POA: Diagnosis not present

## 2023-11-11 DIAGNOSIS — E66813 Obesity, class 3: Secondary | ICD-10-CM

## 2023-11-11 DIAGNOSIS — L02412 Cutaneous abscess of left axilla: Secondary | ICD-10-CM | POA: Diagnosis not present

## 2023-11-11 DIAGNOSIS — F430 Acute stress reaction: Secondary | ICD-10-CM | POA: Diagnosis not present

## 2023-11-11 DIAGNOSIS — A419 Sepsis, unspecified organism: Secondary | ICD-10-CM | POA: Diagnosis not present

## 2023-11-11 DIAGNOSIS — F418 Other specified anxiety disorders: Secondary | ICD-10-CM

## 2023-11-11 DIAGNOSIS — Z6841 Body Mass Index (BMI) 40.0 and over, adult: Secondary | ICD-10-CM | POA: Diagnosis not present

## 2023-11-11 DIAGNOSIS — T148XXA Other injury of unspecified body region, initial encounter: Secondary | ICD-10-CM | POA: Diagnosis not present

## 2023-11-11 DIAGNOSIS — F1120 Opioid dependence, uncomplicated: Secondary | ICD-10-CM

## 2023-11-11 DIAGNOSIS — D509 Iron deficiency anemia, unspecified: Secondary | ICD-10-CM | POA: Diagnosis not present

## 2023-11-11 HISTORY — PX: INCISION AND DRAINAGE HIP: SHX1801

## 2023-11-11 HISTORY — PX: APPLICATION OF WOUND VAC: SHX5189

## 2023-11-11 LAB — AEROBIC/ANAEROBIC CULTURE W GRAM STAIN (SURGICAL/DEEP WOUND)

## 2023-11-11 LAB — BASIC METABOLIC PANEL WITH GFR
Anion gap: 14 (ref 5–15)
BUN: 16 mg/dL (ref 6–20)
CO2: 20 mmol/L — ABNORMAL LOW (ref 22–32)
Calcium: 8.5 mg/dL — ABNORMAL LOW (ref 8.9–10.3)
Chloride: 105 mmol/L (ref 98–111)
Creatinine, Ser: 0.52 mg/dL (ref 0.44–1.00)
GFR, Estimated: >60 mL/min (ref >60)
Glucose, Bld: 90 mg/dL (ref 70–99)
Potassium: 4.2 mmol/L (ref 3.5–5.1)
Sodium: 139 mmol/L (ref 135–145)

## 2023-11-11 SURGERY — IRRIGATION AND DEBRIDEMENT HIP
Anesthesia: Choice | Site: Hip | Laterality: Left

## 2023-11-11 MED ORDER — PROPOFOL 10 MG/ML IV BOLUS
INTRAVENOUS | Status: AC
  Filled 2023-11-11: qty 20

## 2023-11-11 MED ORDER — ORAL CARE MOUTH RINSE: 15.0000 mL | Freq: Once | OROMUCOSAL | Status: AC

## 2023-11-11 MED ORDER — CHLORHEXIDINE GLUCONATE 0.12 % MT SOLN
OROMUCOSAL | Status: AC
  Administered 2023-11-11: 15 mL via OROMUCOSAL
  Filled 2023-11-11: qty 15

## 2023-11-11 MED ORDER — VANCOMYCIN HCL 1000 MG IV SOLR
INTRAVENOUS | Status: AC
Start: 2023-11-11 — End: 2023-11-11
  Filled 2023-11-11: qty 20

## 2023-11-11 MED ORDER — PROPOFOL 10 MG/ML IV BOLUS
INTRAVENOUS | Status: DC | PRN
Start: 2023-11-11 — End: 2023-11-11
  Administered 2023-11-11: 50 mg via INTRAVENOUS
  Administered 2023-11-11: 200 mg via INTRAVENOUS

## 2023-11-11 MED ORDER — VASHE WOUND IRRIGATION OPTIME
TOPICAL | Status: DC | PRN
  Administered 2023-11-11: 34 [oz_av]

## 2023-11-11 MED ORDER — CHLORHEXIDINE GLUCONATE 0.12 % MT SOLN
15.0000 mL | Freq: Once | OROMUCOSAL | Status: AC
Start: 2023-11-11 — End: 2023-11-11

## 2023-11-11 MED ORDER — FENTANYL CITRATE (PF) 250 MCG/5ML IJ SOLN
INTRAMUSCULAR | Status: AC
  Filled 2023-11-11: qty 5

## 2023-11-11 MED ORDER — MIDAZOLAM HCL 2 MG/2ML IJ SOLN
INTRAMUSCULAR | Status: AC
  Filled 2023-11-11: qty 2

## 2023-11-11 MED ORDER — SODIUM CHLORIDE 0.9 % IR SOLN
Status: DC | PRN
  Administered 2023-11-11: 1000 mL

## 2023-11-11 MED ORDER — ACETAMINOPHEN 10 MG/ML IV SOLN
INTRAVENOUS | Status: DC | PRN
  Administered 2023-11-11: 1000 mg via INTRAVENOUS

## 2023-11-11 MED ORDER — DEXMEDETOMIDINE HCL IN NACL 80 MCG/20ML IV SOLN
INTRAVENOUS | Status: DC | PRN
  Administered 2023-11-11 (×3): 8 ug via INTRAVENOUS

## 2023-11-11 MED ORDER — DEXAMETHASONE SOD PHOSPHATE PF 10 MG/ML IJ SOLN
INTRAMUSCULAR | Status: DC | PRN
  Administered 2023-11-11: 5 mg via INTRAVENOUS

## 2023-11-11 MED ORDER — TOBRAMYCIN SULFATE 1.2 G IJ SOLR
INTRAMUSCULAR | Status: AC
  Filled 2023-11-11: qty 1.2

## 2023-11-11 MED ORDER — LACTATED RINGERS IV SOLN
INTRAVENOUS | Status: DC
Start: 2023-11-11 — End: 2023-11-11

## 2023-11-11 MED ORDER — TOBRAMYCIN SULFATE 1.2 G IJ SOLR
INTRAMUSCULAR | Status: DC | PRN
  Administered 2023-11-11: 1.2 g

## 2023-11-11 MED ORDER — KETOROLAC TROMETHAMINE 30 MG/ML IJ SOLN
INTRAMUSCULAR | Status: DC | PRN
  Administered 2023-11-11: 30 mg via INTRAVENOUS

## 2023-11-11 MED ORDER — VANCOMYCIN HCL 1000 MG IV SOLR
INTRAVENOUS | Status: DC | PRN
  Administered 2023-11-11: 1000 mg

## 2023-11-11 MED ORDER — ACETAMINOPHEN 10 MG/ML IV SOLN
INTRAVENOUS | Status: AC
  Filled 2023-11-11: qty 100

## 2023-11-11 MED ORDER — LIDOCAINE 2% (20 MG/ML) 5 ML SYRINGE
INTRAMUSCULAR | Status: DC | PRN
  Administered 2023-11-11: 80 mg via INTRAVENOUS

## 2023-11-11 SURGICAL SUPPLY — 38 items
BAG COUNTER SPONGE SURGICOUNT (BAG) ×1 IMPLANT
COVER SURGICAL LIGHT HANDLE (MISCELLANEOUS) ×2 IMPLANT
DRAPE IMP U-DRAPE 54X76 (DRAPES) ×1 IMPLANT
DRAPE INCISE IOBAN 66X45 STRL (DRAPES) IMPLANT
DRAPE SURG ORHT 6 SPLT 77X108 (DRAPES) ×2 IMPLANT
DRAPE U-SHAPE 47X51 STRL (DRAPES) ×1 IMPLANT
DRSG ADAPTIC 3X8 NADH LF (GAUZE/BANDAGES/DRESSINGS) ×1 IMPLANT
DRSG MEPILEX POST OP 4X8 (GAUZE/BANDAGES/DRESSINGS) ×1 IMPLANT
DURAPREP 26ML APPLICATOR (WOUND CARE) ×1 IMPLANT
ELECT CAUTERY BLADE 6.4 (BLADE) IMPLANT
ELECTRODE REM PT RTRN 9FT ADLT (ELECTROSURGICAL) IMPLANT
GAUZE PAD ABD 8X10 STRL (GAUZE/BANDAGES/DRESSINGS) ×1 IMPLANT
GAUZE SPONGE 4X4 12PLY STRL (GAUZE/BANDAGES/DRESSINGS) ×1 IMPLANT
GLOVE BIOGEL PI IND STRL 9 (GLOVE) ×1 IMPLANT
GLOVE SURG ORTHO 9.0 STRL STRW (GLOVE) ×1 IMPLANT
GOWN STRL REUS W/ TWL XL LVL3 (GOWN DISPOSABLE) ×2 IMPLANT
GRAFT SKIN WND SURGICLOSE M95 (Tissue) IMPLANT
KIT BASIN OR (CUSTOM PROCEDURE TRAY) ×1 IMPLANT
KIT TURNOVER KIT B (KITS) ×1 IMPLANT
MANIFOLD NEPTUNE II (INSTRUMENTS) ×1 IMPLANT
PACK TOTAL JOINT (CUSTOM PROCEDURE TRAY) ×1 IMPLANT
PACK UNIVERSAL I (CUSTOM PROCEDURE TRAY) ×1 IMPLANT
PAD ARMBOARD POSITIONER FOAM (MISCELLANEOUS) ×2 IMPLANT
SET HNDPC FAN SPRY TIP SCT (DISPOSABLE) IMPLANT
SOLN 0.9% NACL 1000 ML (IV SOLUTION) ×1 IMPLANT
SOLN 0.9% NACL POUR BTL 1000ML (IV SOLUTION) ×1 IMPLANT
SOLN STERILE WATER 1000 ML (IV SOLUTION) ×1 IMPLANT
SOLN STERILE WATER BTL 1000 ML (IV SOLUTION) ×1 IMPLANT
SPONGE T-LAP 18X18 ~~LOC~~+RFID (SPONGE) ×1 IMPLANT
STAPLER SKIN PROX 35W (STAPLE) ×1 IMPLANT
SUT ETHIBOND NAB CT1 #1 30IN (SUTURE) ×1 IMPLANT
SUT ETHILON 2 0 PSLX (SUTURE) IMPLANT
SUT VIC AB 1 CTX36XBRD ANBCTR (SUTURE) ×1 IMPLANT
SUT VIC AB 2-0 CT1 TAPERPNT 27 (SUTURE) ×1 IMPLANT
SWAB CULTURE ESWAB REG 1ML (MISCELLANEOUS) IMPLANT
TOWEL GREEN STERILE (TOWEL DISPOSABLE) ×1 IMPLANT
TOWEL GREEN STERILE FF (TOWEL DISPOSABLE) ×1 IMPLANT
UNDERPAD 30X36 HEAVY ABSORB (UNDERPADS AND DIAPERS) ×1 IMPLANT

## 2023-11-11 NOTE — Op Note (Signed)
 11/11/2023  2:01 PM  PATIENT:  [name]    PRE-OPERATIVE DIAGNOSIS:  Left Hip Wound  POST-OPERATIVE DIAGNOSIS:  Same  PROCEDURE: Excisional debridement left hip with excision skin soft tissue muscle and fascia. Local tissue transfer for wound closure 15 x 10 cm. Application of Kerecis micro graft 95 cm to cover wound surface area greater than 150 cm. Application of vancomycin  powder 1 g and tobramycin  powder 1.2 g. Application of peel in place wound VAC sponge. Tissue sent for cultures.  SURGEON:  Jerona LULLA Sage, MD  PHYSICIAN ASSISTANT:None ANESTHESIA:   General  PREOPERATIVE INDICATIONS:  [name] is a  28 y.o. female with a diagnosis of Left Hip Wound who failed conservative measures and elected for surgical management.    The risks benefits and alternatives were discussed with the patient preoperatively including but not limited to the risks of infection, bleeding, nerve injury, cardiopulmonary complications, the need for revision surgery, among others, and the patient was willing to proceed.  OPERATIVE IMPLANTS:   Implant Name Type Inv. Item Serial No. Manufacturer Lot No. LRB No. Used Action  GRAFT SKIN WND SURGICLOSE M95 - [id number] Tissue GRAFT SKIN WND SURGICLOSE M95  [employer] [id number] Left 1 Implanted    @ENCIMAGES @  OPERATIVE FINDINGS: Wound margins were clear good granulation tissue there was some deep retained nonabsorbable suture this was removed.  OPERATIVE PROCEDURE: Patient was brought the operating room underwent a general anesthetic.  After adequate levels anesthesia were obtained patient's left lower extremity was prepped using DuraPrep draped into a sterile field a timeout was called.  Elliptical incision was made around the previous incision through healthy viable margins this left the wound that was 10 x 15 cm.  There is furthered excisional debridement of skin and soft tissue muscle and fascia deep within the wound.  There was large  tunneling of the wound anteriorly over the abdomen.  Electrocautery was used hemostasis the wound was irrigated with Vashe.  The wound was then covered for soft tissue reinforcement with Kerecis micro graft 95 cm and 1 g vancomycin  powder and 1.2 g of tobramycin  powder.  Local tissue transfer after undermining the edges was performed to close the wound 15 x 10 cm.  The peel and place wound VAC sponge was applied this had a good suction fit.  Patient was extubated taken the PACU in stable condition.   DISCHARGE PLANNING:  Antibiotic duration: Continue antibiotics adjust according to most recent tissue cultures  Weightbearing: Weightbearing as tolerated  Pain medication: Continue pain medication  Dressing care/ Wound VAC: Continue wound VAC for at least 1 week  Ambulatory devices: Walker  Discharge to: Discharge planning based on therapy recommendations.  Follow-up: In the office 1 week post operative.
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
## 2023-11-11 NOTE — Anesthesia Procedure Notes (Signed)
 Procedure Name: LMA Insertion Date/Time: 11/11/2023 1:08 PM  Performed by: Jolynn Mage, CRNAPre-anesthesia Checklist: Patient identified, Emergency Drugs available, Suction available and Patient being monitored Patient Re-evaluated:Patient Re-evaluated prior to induction Oxygen Delivery Method: Circle system utilized Preoxygenation: Pre-oxygenation with 100% oxygen Induction Type: IV induction Ventilation: Mask ventilation without difficulty LMA: LMA flexible inserted LMA Size: 4.0 Number of attempts: 1 Placement Confirmation: positive ETCO2 and breath sounds checked- equal and bilateral Tube secured with: Tape Dental Injury: Teeth and Oropharynx as per pre-operative assessment

## 2023-11-11 NOTE — Transfer of Care (Signed)
 Immediate Anesthesia Transfer of Care Note  Patient: [name]  Procedure(s) Performed: IRRIGATION AND DEBRIDEMENT HIP (Left: Hip) APPLICATION, WOUND VAC (Left: Hip)  Patient Location: PACU  Anesthesia Type:General  Level of Consciousness: awake, alert , patient cooperative, and responds to stimulation  Airway & Oxygen Therapy: Patient Spontanous Breathing and Patient connected to face mask oxygen  Post-op Assessment: Report given to RN and Post -op Vital signs reviewed and stable  Post vital signs: Reviewed and stable  Last Vitals:  Vitals Value Taken Time  BP 106/65 11/11/23 14:00  Temp 36.5 C 11/11/23 13:52  Pulse 73 11/11/23 14:00  Resp 14 11/11/23 14:01  SpO2 100 % 11/11/23 14:00  Vitals shown include unfiled device data.  Last Pain:  Vitals:   11/11/23 1352  TempSrc:   PainSc: Asleep      Patients Stated Pain Goal: 0 (11/07/23 1925)  Complications: No notable events documented.

## 2023-11-11 NOTE — Anesthesia Preprocedure Evaluation (Signed)
 Anesthesia Evaluation  Patient identified by MRN, date of birth, ID band Patient awake    Reviewed: Allergy & Precautions, NPO status , Patient's Chart, lab work & pertinent test results  History of Anesthesia Complications (+) history of anesthetic complications  Airway Mallampati: III  TM Distance: >3 FB Neck ROM: Full    Dental  (+) Dental Advisory Given, Chipped   Pulmonary Current Smoker and Patient abstained from smoking.   breath sounds clear to auscultation       Cardiovascular negative cardio ROS  Rhythm:Regular     Neuro/Psych  Headaches PSYCHIATRIC DISORDERS Anxiety Depression       GI/Hepatic Neg liver ROS,GERD  Medicated,,  Endo/Other    Class 3 obesity  Renal/GU negative Renal ROS     Musculoskeletal negative musculoskeletal ROS (+)    Abdominal   Peds  Hematology  (+) Blood dyscrasia, anemia Lab Results      Component                Value               Date                      WBC                      6.4                 11/09/2023                HGB                      9.5 (L)             11/09/2023                HCT                      31.6 (L)            11/09/2023                MCV                      76.7 (L)            11/09/2023                PLT                      409 (H)             11/09/2023              Anesthesia Other Findings   Reproductive/Obstetrics                              Anesthesia Physical Anesthesia Plan  ASA: 3  Anesthesia Plan:    Post-op Pain Management: Ofirmev  IV (intra-op)*, Toradol  IV (intra-op)* and Ketamine  IV*   Induction: Inhalational  PONV Risk Score and Plan: 2 and Ondansetron  and Dexamethasone   Airway Management Planned: Oral ETT and LMA  Additional Equipment: None  Intra-op Plan:   Post-operative Plan: Extubation in OR  Informed Consent: I have reviewed the patients History and Physical, chart, labs and  discussed the procedure including the risks, benefits and alternatives for the proposed anesthesia with the patient or  authorized representative who has indicated his/her understanding and acceptance.     Dental advisory given  Plan Discussed with: CRNA and Anesthesiologist  Anesthesia Plan Comments:          Anesthesia Quick Evaluation
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
## 2023-11-11 NOTE — Anesthesia Postprocedure Evaluation (Signed)
 Anesthesia Post Note  Patient: [name]  Procedure(s) Performed: IRRIGATION AND DEBRIDEMENT HIP (Left: Hip) APPLICATION, WOUND VAC (Left: Hip)     Patient location during evaluation: PACU Anesthesia Type: General Level of consciousness: awake and alert Pain management: pain level controlled Vital Signs Assessment: post-procedure vital signs reviewed and stable Respiratory status: spontaneous breathing, nonlabored ventilation, respiratory function stable and patient connected to nasal cannula oxygen Cardiovascular status: blood pressure returned to baseline and stable Postop Assessment: no apparent nausea or vomiting Anesthetic complications: no   No notable events documented.  Last Vitals:  Vitals:   11/11/23 1513 11/11/23 1947  BP: 121/67 106/64  Pulse: 61 75  Resp: 16 20  Temp: 36.4 C 36.6 C  SpO2: 100% 98%    Last Pain:  Vitals:   11/11/23 1947  TempSrc: Oral  PainSc: 0-No pain   Pain Goal: Patients Stated Pain Goal: 0 (11/07/23 1925)                 Ethelean Colla

## 2023-11-11 NOTE — Progress Notes (Signed)
 OT Cancellation Note  Patient Details Name: [name] MRN: [id number] DOB: 09-Sep-1995   Cancelled Treatment:    Reason Eval/Treat Not Completed: Patient at procedure or test/ unavailable (Patient having I&D today  OT to continue to follow)  [name] 11/11/2023, 1:18 PM

## 2023-11-11 NOTE — Interval H&P Note (Signed)
 History and Physical Interval Note:  11/11/2023 6:51 AM  [name]  has presented today for surgery, with the diagnosis of Left Hip Wound.  The various methods of treatment have been discussed with the patient and family. After consideration of risks, benefits and other options for treatment, the patient has consented to  Procedure(s) with comments: IRRIGATION AND DEBRIDEMENT HIP (Left) - LEFT HIP DEBRIDEMENT as a surgical intervention.  The patient's history has been reviewed, patient examined, no change in status, stable for surgery.  I have reviewed the patient's chart and labs.  Questions were answered to the patient's satisfaction.     Brittni Hult V Finola Rosal

## 2023-11-12 ENCOUNTER — Encounter (HOSPITAL_COMMUNITY): Payer: Self-pay | Admitting: Orthopedic Surgery

## 2023-11-12 DIAGNOSIS — R6520 Severe sepsis without septic shock: Secondary | ICD-10-CM | POA: Diagnosis not present

## 2023-11-12 DIAGNOSIS — A419 Sepsis, unspecified organism: Secondary | ICD-10-CM | POA: Diagnosis not present

## 2023-11-12 LAB — VANCOMYCIN, PEAK: Vancomycin Pk: 33 ug/mL (ref 30–40)

## 2023-11-12 LAB — VANCOMYCIN, TROUGH: Vancomycin Tr: 16 ug/mL (ref 15–20)

## 2023-11-12 MED ORDER — METRONIDAZOLE 500 MG PO TABS
500.0000 mg | ORAL_TABLET | Freq: Two times a day (BID) | ORAL | Status: DC
  Administered 2023-11-12 – 2023-11-13 (×2): 500 mg via ORAL
  Filled 2023-11-12 (×2): qty 1

## 2023-11-12 MED ORDER — VANCOMYCIN HCL IN DEXTROSE 1-5 GM/200ML-% IV SOLN
1000.0000 mg | Freq: Two times a day (BID) | INTRAVENOUS | Status: DC
  Administered 2023-11-13: 1000 mg via INTRAVENOUS
  Filled 2023-11-12: qty 200

## 2023-11-12 NOTE — Anesthesia Postprocedure Evaluation (Signed)
 Anesthesia Post Note  Patient: [name]  Procedure(s) Performed: IRRIGATION AND DEBRIDEMENT WOUND (Left: Hip)     Patient location during evaluation: PACU Anesthesia Type: General Level of consciousness: awake and alert Pain management: pain level controlled Vital Signs Assessment: post-procedure vital signs reviewed and stable Respiratory status: spontaneous breathing, nonlabored ventilation and respiratory function stable Cardiovascular status: blood pressure returned to baseline and stable Postop Assessment: no apparent nausea or vomiting Anesthetic complications: no   No notable events documented.                Adler Alton

## 2023-11-12 NOTE — Progress Notes (Signed)
 ID brief note  Results for orders placed or performed during the hospital encounter of 11/04/23  Culture, blood (Routine x 2)     Status: None   Collection Time: 11/04/23  1:49 PM   Specimen: BLOOD LEFT ARM  Result Value Ref Range Status   Specimen Description BLOOD LEFT ARM  Final   Special Requests   Final    BOTTLES DRAWN AEROBIC AND ANAEROBIC Blood Culture results may not be optimal due to an inadequate volume of blood received in culture bottles   Culture   Final    NO GROWTH 5 DAYS Performed at [facility], [address] [place], [place] [address]    Report Status 11/09/2023 FINAL  Final  Resp panel by RT-PCR (RSV, Flu A&B, Covid) Anterior Nasal Swab     Status: None   Collection Time: 11/04/23  1:53 PM   Specimen: Anterior Nasal Swab  Result Value Ref Range Status   SARS Coronavirus 2 by RT PCR NEGATIVE NEGATIVE Final   Influenza A by PCR NEGATIVE NEGATIVE Final   Influenza B by PCR NEGATIVE NEGATIVE Final    Comment: (NOTE) The Xpert Xpress SARS-CoV-2/FLU/RSV plus assay is intended as an aid in the diagnosis of influenza from Nasopharyngeal swab specimens and should not be used as a sole basis for treatment. Nasal washings and aspirates are unacceptable for Xpert Xpress SARS-CoV-2/FLU/RSV testing.  Fact Sheet for Patients: BloggerCourse.com  Fact Sheet for Healthcare Providers: SeriousBroker.it  This test is not yet approved or cleared by the [place]  FDA and has been authorized for detection and/or diagnosis of SARS-CoV-2 by FDA under an Emergency Use Authorization (EUA). This EUA will remain in effect (meaning this test can be used) for the duration of the COVID-19 declaration under Section 564(b)(1) of the Act, 21 U.S.C. section 360bbb-3(b)(1), unless the authorization is terminated or revoked.     Resp Syncytial Virus by PCR NEGATIVE NEGATIVE Final    Comment: (NOTE) Fact Sheet for  Patients: BloggerCourse.com  Fact Sheet for Healthcare Providers: SeriousBroker.it  This test is not yet approved or cleared by the [place]  FDA and has been authorized for detection and/or diagnosis of SARS-CoV-2 by FDA under an Emergency Use Authorization (EUA). This EUA will remain in effect (meaning this test can be used) for the duration of the COVID-19 declaration under Section 564(b)(1) of the Act, 21 U.S.C. section 360bbb-3(b)(1), unless the authorization is terminated or revoked.  Performed at [facility], [address] [place], [place] [address]   Culture, blood (Routine x 2)     Status: None   Collection Time: 11/04/23  6:27 PM   Specimen: BLOOD RIGHT ARM  Result Value Ref Range Status   Specimen Description BLOOD RIGHT ARM  Final   Special Requests   Final    BOTTLES DRAWN AEROBIC AND ANAEROBIC Blood Culture results may not be optimal due to an inadequate volume of blood received in culture bottles   Culture   Final    NO GROWTH 5 DAYS Performed at [facility], [address], [place], [place] [address]    Report Status 11/09/2023 FINAL  Final  Aerobic/Anaerobic Culture w Gram Stain (surgical/deep wound)     Status: None   Collection Time: 11/06/23 10:28 AM   Specimen: Abscess  Result Value Ref Range Status   Specimen Description ABSCESS  Final   Special Requests NONE  Final   Gram Stain   Final    ABUNDANT WBC PRESENT,  PREDOMINANTLY PMN FEW GRAM POSITIVE COCCI IN CLUSTERS    Culture   Final    MODERATE STAPHYLOCOCCUS AUREUS SUSCEPTIBILITIES PERFORMED ON PREVIOUS CULTURE WITHIN THE LAST 5 DAYS. NO ANAEROBES ISOLATED Performed at [facility], [address], [place], [place] [address]    Report Status 11/11/2023 FINAL  Final  Aerobic/Anaerobic Culture w Gram Stain (surgical/deep wound)     Status: None   Collection Time: 11/06/23 10:29 AM   Specimen: Abscess  Result Value Ref Range  Status   Specimen Description ABSCESS  Final   Special Requests NONE  Final   Gram Stain   Final    ABUNDANT WBC PRESENT, PREDOMINANTLY PMN MODERATE GRAM POSITIVE COCCI IN CLUSTERS    Culture   Final    ABUNDANT STAPHYLOCOCCUS AUREUS SUSCEPTIBILITIES PERFORMED ON PREVIOUS CULTURE WITHIN THE LAST 5 DAYS. NO ANAEROBES ISOLATED Performed at [facility], [address] [place] [address], [place], [place] [address]    Report Status 11/11/2023 FINAL  Final  Aerobic/Anaerobic Culture w Gram Stain (surgical/deep wound)     Status: None   Collection Time: 11/06/23 10:29 AM   Specimen: Abscess  Result Value Ref Range Status   Specimen Description ABSCESS  Final   Special Requests NONE  Final   Gram Stain   Final    ABUNDANT WBC PRESENT, PREDOMINANTLY PMN FEW GRAM POSITIVE COCCI IN CLUSTERS    Culture   Final    ABUNDANT METHICILLIN RESISTANT STAPHYLOCOCCUS AUREUS NO ANAEROBES ISOLATED Performed at [facility], [address], [place], [place] [address]    Report Status 11/11/2023 FINAL  Final   Organism ID, Bacteria METHICILLIN RESISTANT STAPHYLOCOCCUS AUREUS  Final      Susceptibility   Methicillin resistant staphylococcus aureus - MIC*    CIPROFLOXACIN >=8 RESISTANT Resistant     ERYTHROMYCIN >=8 RESISTANT Resistant     GENTAMICIN <=0.5 SENSITIVE Sensitive     OXACILLIN >=4 RESISTANT Resistant     TETRACYCLINE <=1 SENSITIVE Sensitive     VANCOMYCIN  1 SENSITIVE Sensitive     TRIMETH /SULFA  >=320 RESISTANT Resistant     CLINDAMYCIN  <=0.25 SENSITIVE Sensitive     RIFAMPIN <=0.5 SENSITIVE Sensitive     Inducible Clindamycin  NEGATIVE Sensitive     LINEZOLID 2 SENSITIVE Sensitive     * ABUNDANT METHICILLIN RESISTANT STAPHYLOCOCCUS AUREUS  Surgical pcr screen     Status: None   Collection Time: 11/10/23  5:59 PM   Specimen: Nasal Mucosa; Nasal Swab  Result Value Ref Range Status   MRSA, PCR NEGATIVE NEGATIVE Final   Staphylococcus aureus NEGATIVE NEGATIVE Final    Comment: (NOTE) The  Xpert SA Assay (FDA approved for NASAL specimens in patients 70 years of age and older), is one component of a comprehensive surveillance program. It is not intended to diagnose infection nor to guide or monitor treatment. Performed at [facility], [address], [place], [place] [address]   Aerobic/Anaerobic Culture w Gram Stain (surgical/deep wound)     Status: None (Preliminary result)   Collection Time: 11/11/23  1:41 PM   Specimen: Abscess  Result Value Ref Range Status   Specimen Description FLUID LEFT HIP  Final   Special Requests PT ON VANC TOBRAMYCIN   Final   Gram Stain NO WBC SEEN NO ORGANISMS SEEN   Final   Culture   Final    RARE STAPHYLOCOCCUS AUREUS WITHIN MIXED ORGANISMS CULTURE REINCUBATED FOR BETTER GROWTH Performed at [facility], [address] [place], [place]  [address]    Report Status PENDING  Incomplete   Continue Vancomycin , cefepime , will add po metronidazole  500mg  po bid pending cultures growth  Will see tomorrow  Annalee Orem, MD Infectious Disease Physician [facility] [address] [place], [place] [address] Phone: [phone]  Fax: [phone]

## 2023-11-12 NOTE — Progress Notes (Signed)
 Pharmacy Antibiotic Note  [name] is a 28 y.o. female admitted on 11/04/2023 with hip abscess.  Pharmacy has been consulted for vancomycin  dosing. Now s/p OR debridement with wound vac in place  -Vancomycin  Peak 33 (drawn earlier than optimal), vancomycin  trough 16, cAUC 550 -Ke 0.13, t1/2 5.4h -sCr ~bl -OR Cx: MRSA   Plan: Change vancomycin  dose to 1000 mg IV 12h (eAUC 488.5, Cmax 37, Cmin 9) Monitor renal function, Cx and clinical progression to narrow Vancomycin  levels as indicated \  Height: 5' 3 (160 cm) Weight: 127 kg (279 lb 15.8 oz) IBW/kg (Calculated) : 52.4  Temp (24hrs), Avg:98.1 F (36.7 C), Min:97.8 F (36.6 C), Max:98.6 F (37 C)  Recent Labs  Lab 11/06/23 0320 11/07/23 0334 11/08/23 0311 11/09/23 0401 11/09/23 1553 11/10/23 0305 11/11/23 1048 11/12/23 0952 11/12/23 1532  WBC 6.0 5.7 8.8 6.4  --  7.2  --   --   --   CREATININE 0.67 0.60  --   --   --   --  0.52  --   --   VANCOTROUGH  --   --   --   --   --  9*  --   --  16  VANCOPEAK  --   --   --   --  43*  --   --  33  --     Estimated Creatinine Clearance: 137.1 mL/min (by C-G formula based on SCr of 0.52 mg/dL).    Allergies  Allergen Reactions   Blueberry Fruit Extract Anaphylaxis   Contrast Media [Iodinated Contrast Media] Anaphylaxis, Hives and Rash   Omnipaque  [Iohexol ] Hives, Itching, Nausea And Vomiting and Swelling   Codeine Hives, Itching and Nausea And Vomiting   Latex Hives and Other (See Comments)    Welts, also   Robaxin  [Methocarbamol ] Hives   Antimicrobials at this Admission 10/8 Vanc>> 10/8 Cefepime >> 10/8 Flagyl  >>10/14  Microbiology Results  10/8 BCx: ngtd 10/10 OR Abscess 3/3: abundant MRSA (S-Vanc, linezolid)  Larraine Brazier, PharmD Clinical Pharmacist 11/12/2023  4:31 PM **Pharmacist phone directory can now be found on [email or web address] ([email or web address]).  Listed under [facility].
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
## 2023-11-12 NOTE — Plan of Care (Signed)
  Problem: Respiratory: Goal: Ability to maintain adequate ventilation will improve Outcome: Progressing   Problem: Clinical Measurements: Goal: Diagnostic test results will improve Outcome: Progressing Goal: Signs and symptoms of infection will decrease Outcome: Progressing   Problem: Clinical Measurements: Goal: Ability to maintain clinical measurements within normal limits will improve Outcome: Progressing Goal: Will remain free from infection Outcome: Progressing Goal: Diagnostic test results will improve Outcome: Progressing Goal: Respiratory complications will improve Outcome: Progressing Goal: Cardiovascular complication will be avoided Outcome: Progressing   Problem: Elimination: Goal: Will not experience complications related to bowel motility Outcome: Progressing Goal: Will not experience complications related to urinary retention Outcome: Progressing   Problem: Pain Managment: Goal: General experience of comfort will improve and/or be controlled Outcome: Progressing

## 2023-11-12 NOTE — Progress Notes (Signed)
 [facility]   Left hip wound s/p Excisional debridement left hip with excision skin soft tissue muscle and fascia. Local tissue transfer for wound closure 15 x 10 cm. Application of Kerecis micro graft 95 cm to cover wound surface area greater than 150 cm. Application of vancomycin  powder 1 g and tobramycin  powder 1.2 g. Application of peel in place wound VAC sponge. Tissue sent for cultures.  Culture negative to date. No leukocytosis.  Currently on Cefepime  and Vancomycin .   Pending final culture for recommendations.    Vac with good seal 150 cc OP  Antibiotic duration: Continue antibiotics adjust according to most recent tissue cultures   Weightbearing: Weightbearing as tolerated   Pain medication: Continue pain medication   Dressing care/ Wound VAC: Continue wound VAC for at least 1 week   Ambulatory devices: Walker   Discharge to: Discharge planning based on therapy recommendations.   Follow-up: In the office 1 week post operative.   Maurilio Deland Collet PA-C PA-C

## 2023-11-13 ENCOUNTER — Other Ambulatory Visit (HOSPITAL_COMMUNITY): Payer: Self-pay

## 2023-11-13 DIAGNOSIS — F430 Acute stress reaction: Secondary | ICD-10-CM | POA: Diagnosis not present

## 2023-11-13 DIAGNOSIS — F1120 Opioid dependence, uncomplicated: Secondary | ICD-10-CM | POA: Diagnosis not present

## 2023-11-13 DIAGNOSIS — F411 Generalized anxiety disorder: Secondary | ICD-10-CM

## 2023-11-13 DIAGNOSIS — Z5329 Procedure and treatment not carried out because of patient's decision for other reasons: Secondary | ICD-10-CM

## 2023-11-13 DIAGNOSIS — L02416 Cutaneous abscess of left lower limb: Secondary | ICD-10-CM | POA: Diagnosis not present

## 2023-11-13 DIAGNOSIS — T8140XD Infection following a procedure, unspecified, subsequent encounter: Secondary | ICD-10-CM

## 2023-11-13 DIAGNOSIS — A419 Sepsis, unspecified organism: Secondary | ICD-10-CM | POA: Diagnosis not present

## 2023-11-13 LAB — CBC
HCT: 30.3 % — ABNORMAL LOW (ref 36.0–46.0)
Hemoglobin: 9.2 g/dL — ABNORMAL LOW (ref 12.0–15.0)
MCH: 23.4 pg — ABNORMAL LOW (ref 26.0–34.0)
MCHC: 30.4 g/dL (ref 30.0–36.0)
MCV: 76.9 fL — ABNORMAL LOW (ref 80.0–100.0)
Platelets: 415 K/uL — ABNORMAL HIGH (ref 150–400)
RBC: 3.94 MIL/uL (ref 3.87–5.11)
RDW: 15.3 % (ref 11.5–15.5)
WBC: 5.6 K/uL (ref 4.0–10.5)
nRBC: 0 % (ref 0.0–0.2)

## 2023-11-13 LAB — RENAL FUNCTION PANEL
Albumin: 3 g/dL — ABNORMAL LOW (ref 3.5–5.0)
Anion gap: 12 (ref 5–15)
BUN: 17 mg/dL (ref 6–20)
CO2: 25 mmol/L (ref 22–32)
Calcium: 8.6 mg/dL — ABNORMAL LOW (ref 8.9–10.3)
Chloride: 101 mmol/L (ref 98–111)
Creatinine, Ser: 0.68 mg/dL (ref 0.44–1.00)
GFR, Estimated: >60 mL/min (ref >60)
Glucose, Bld: 112 mg/dL — ABNORMAL HIGH (ref 70–99)
Phosphorus: 5.1 mg/dL — ABNORMAL HIGH (ref 2.5–4.6)
Potassium: 3.9 mmol/L (ref 3.5–5.1)
Sodium: 138 mmol/L (ref 135–145)

## 2023-11-13 LAB — MAGNESIUM: Magnesium: 2.4 mg/dL (ref 1.7–2.4)

## 2023-11-13 MED ORDER — DOXYCYCLINE HYCLATE 100 MG PO TABS
100.0000 mg | ORAL_TABLET | Freq: Two times a day (BID) | ORAL | 0 refills | Status: AC
  Filled 2023-11-13: qty 68, 34d supply, fill #0

## 2023-11-13 MED ORDER — AMOXICILLIN-POT CLAVULANATE 875-125 MG PO TABS
1.0000 | ORAL_TABLET | Freq: Two times a day (BID) | ORAL | 0 refills | Status: AC
  Filled 2023-11-13: qty 68, 34d supply, fill #0
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
## 2023-11-13 NOTE — Plan of Care (Signed)
 Patient left AMA Problem: Fluid Volume: Goal: Hemodynamic stability will improve Outcome: Not Met (add Reason)   Problem: Clinical Measurements: Goal: Diagnostic test results will improve Outcome: Not Met (add Reason) Goal: Signs and symptoms of infection will decrease Outcome: Not Met (add Reason)   Problem: Respiratory: Goal: Ability to maintain adequate ventilation will improve Outcome: Not Met (add Reason)   Problem: Education: Goal: Knowledge of General Education information will improve Description: Including pain rating scale, medication(s)/side effects and non-pharmacologic comfort measures Outcome: Not Met (add Reason)   Problem: Health Behavior/Discharge Planning: Goal: Ability to manage health-related needs will improve Outcome: Not Met (add Reason)   Problem: Clinical Measurements: Goal: Ability to maintain clinical measurements within normal limits will improve Outcome: Not Met (add Reason) Goal: Will remain free from infection Outcome: Not Met (add Reason) Goal: Diagnostic test results will improve Outcome: Not Met (add Reason) Goal: Respiratory complications will improve Outcome: Not Met (add Reason) Goal: Cardiovascular complication will be avoided Outcome: Not Met (add Reason)   Problem: Activity: Goal: Risk for activity intolerance will decrease Outcome: Not Met (add Reason)   Problem: Nutrition: Goal: Adequate nutrition will be maintained Outcome: Not Met (add Reason)   Problem: Coping: Goal: Level of anxiety will decrease Outcome: Not Met (add Reason)   Problem: Elimination: Goal: Will not experience complications related to bowel motility Outcome: Not Met (add Reason) Goal: Will not experience complications related to urinary retention Outcome: Not Met (add Reason)   Problem: Pain Managment: Goal: General experience of comfort will improve and/or be controlled Outcome: Not Met (add Reason)   Problem: Safety: Goal: Ability to remain free  from injury will improve Outcome: Not Met (add Reason)   Problem: Skin Integrity: Goal: Risk for impaired skin integrity will decrease Outcome: Not Met (add Reason)

## 2023-11-13 NOTE — Progress Notes (Addendum)
 1130 patient asking to leave NOW says ABT are suppose to be oral they are not mother is at bedside says she was told yesterday that Monic could leave today with her wound vac if blood culture still dont show anything today. Risk explained to patient regarding IV meds MRSA WBC and SEPSIS patient states she is leaving today. Patient mother at bedside is trying to get patient to stay patient refusing.   1218 patient signed AMA form will wait til ABT completes that is infusing before leaving, patients mother at bedside

## 2023-11-13 NOTE — Progress Notes (Signed)
 [facility]   Left hip wound s/p Excisional debridement left hip with excision skin soft tissue muscle and fascia. Local tissue transfer for wound closure 15 x 10 cm. Application of Kerecis micro graft 95 cm to cover wound surface area greater than 150 cm. Application of vancomycin  powder 1 g and tobramycin  powder 1.2 g. Application of peel in place wound VAC sponge. Tissue sent for cultures. Rae staph aureus, pending final cultures   Culture negative to date. No leukocytosis.  Currently on Cefepime  and Vancomycin .   Pending final culture for recommendations.     Vac with good seal  150 cc bloody/SS  OP   Antibiotic duration: Continue antibiotics adjust according to most recent tissue cultures   Weightbearing: Weightbearing as tolerated   Pain medication: Continue pain medication   Dressing care/ Wound VAC: Continue wound VAC for at least 1 week.  She has a small vac system with her that we may use pending discharge date   Ambulatory devices: Walker   Discharge to: Discharge planning based on therapy recommendations.   Follow-up: In the office 1 week post operative  Maurilio Deland Collet PA-C [phone]
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
## 2023-11-13 NOTE — Progress Notes (Signed)
 Patient left AMA personal wound vac was attached  Patient and family member walked out

## 2023-11-17 LAB — AEROBIC/ANAEROBIC CULTURE W GRAM STAIN (SURGICAL/DEEP WOUND): Gram Stain: NONE SEEN

## 2023-11-20 ENCOUNTER — Telehealth: Payer: Self-pay | Admitting: Orthopedic Surgery

## 2023-11-20 NOTE — Telephone Encounter (Signed)
 I called and sw pt's mother and she said that she was not aware the pt was to come in one week for vac removal from surgery. Advised to leave the vac on and if it should alarm that the canister is full over the weekend then she can remove this and apply a dry dressing and change daily. ( I had offered appt to come in today and pt's mother is at work and unable to bring her but states that she has done this before and attended to pt's wound care and feels comfortable with this) she will follow up in the office on Monday at 10:45 and will call with any questions or concerns. Voiced understanding and is comfortable with plane.

## 2023-11-20 NOTE — Telephone Encounter (Signed)
 Patient called and said she needs a new order for the  wound vac. [phone]

## 2023-11-23 ENCOUNTER — Encounter: Payer: Self-pay | Admitting: Physician Assistant

## 2023-11-23 ENCOUNTER — Ambulatory Visit: Payer: MEDICAID | Admitting: Physician Assistant

## 2023-11-23 DIAGNOSIS — L02416 Cutaneous abscess of left lower limb: Secondary | ICD-10-CM
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
## 2023-11-26 ENCOUNTER — Encounter: Payer: MEDICAID | Admitting: Orthopedic Surgery

## 2023-11-30 ENCOUNTER — Encounter: Payer: Self-pay | Admitting: Radiology

## 2023-11-30 ENCOUNTER — Ambulatory Visit (INDEPENDENT_AMBULATORY_CARE_PROVIDER_SITE_OTHER): Payer: MEDICAID | Admitting: Physician Assistant

## 2023-11-30 ENCOUNTER — Encounter: Payer: Self-pay | Admitting: Physician Assistant

## 2023-11-30 ENCOUNTER — Other Ambulatory Visit: Payer: Self-pay

## 2023-11-30 DIAGNOSIS — M545 Low back pain, unspecified: Secondary | ICD-10-CM

## 2023-11-30 DIAGNOSIS — L02416 Cutaneous abscess of left lower limb: Secondary | ICD-10-CM
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
## 2023-12-01 ENCOUNTER — Inpatient Hospital Stay: Payer: MEDICAID | Admitting: Internal Medicine

## 2023-12-08 ENCOUNTER — Encounter: Payer: Self-pay | Admitting: Physician Assistant

## 2023-12-08 ENCOUNTER — Ambulatory Visit (INDEPENDENT_AMBULATORY_CARE_PROVIDER_SITE_OTHER): Payer: MEDICAID | Admitting: Physician Assistant

## 2023-12-08 DIAGNOSIS — L02416 Cutaneous abscess of left lower limb: Secondary | ICD-10-CM
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
## 2023-12-15 ENCOUNTER — Encounter: Payer: MEDICAID | Admitting: Physician Assistant

## 2023-12-16 ENCOUNTER — Ambulatory Visit: Payer: MEDICAID | Admitting: Infectious Diseases

## 2023-12-17 ENCOUNTER — Telehealth: Payer: Self-pay | Admitting: Orthopedic Surgery

## 2023-12-17 NOTE — Telephone Encounter (Signed)
 Patient called and said that the wound vac is discontinued and she needs verbal comfirmation. CB#[phone]  or CB#[phone]

## 2024-01-19 ENCOUNTER — Telehealth: Payer: Self-pay

## 2024-01-19 NOTE — Telephone Encounter (Signed)
 Patient mother called to ask if she could have refills on antibiotics. Has been off antibiotics for a few weeks, but since she has wound vac on would like to resume. Denies any fever, chills, pain. Spoke with Dr. Manandhar and would like for pt to continue off antibiotics at this time. Is scheduled to come in on 12/29 for follow up and will discuss at that time if any additional treatment is needed. Lorenda CHRISTELLA Code, RMA

## 2024-01-25 ENCOUNTER — Ambulatory Visit: Payer: MEDICAID | Admitting: Infectious Diseases

## 2024-01-25 ENCOUNTER — Other Ambulatory Visit: Payer: Self-pay

## 2024-01-25 ENCOUNTER — Encounter: Payer: Self-pay | Admitting: Infectious Diseases

## 2024-01-25 VITALS — BP 134/82 | HR 112 | Temp 98.6°F | Resp 16 | Ht 63.0 in | Wt 300.0 lb

## 2024-01-25 DIAGNOSIS — Z5181 Encounter for therapeutic drug level monitoring: Secondary | ICD-10-CM | POA: Diagnosis not present

## 2024-01-25 DIAGNOSIS — S71002D Unspecified open wound, left hip, subsequent encounter: Secondary | ICD-10-CM

## 2024-01-25 DIAGNOSIS — A4902 Methicillin resistant Staphylococcus aureus infection, unspecified site: Secondary | ICD-10-CM
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
## 2024-01-26 ENCOUNTER — Ambulatory Visit (INDEPENDENT_AMBULATORY_CARE_PROVIDER_SITE_OTHER): Payer: MEDICAID | Admitting: Physician Assistant

## 2024-01-26 ENCOUNTER — Encounter: Payer: Self-pay | Admitting: Physician Assistant

## 2024-01-26 DIAGNOSIS — L02416 Cutaneous abscess of left lower limb: Secondary | ICD-10-CM

## 2024-01-26 DIAGNOSIS — A4902 Methicillin resistant Staphylococcus aureus infection, unspecified site: Secondary | ICD-10-CM | POA: Insufficient documentation

## 2024-01-26 DIAGNOSIS — S71002A Unspecified open wound, left hip, initial encounter: Secondary | ICD-10-CM | POA: Insufficient documentation

## 2024-01-26 LAB — CBC
HCT: 39.7 % (ref 35.9–46.0)
Hemoglobin: 11.9 g/dL (ref 11.7–15.5)
MCH: 20.6 pg — ABNORMAL LOW (ref 27.0–33.0)
MCHC: 30 g/dL — ABNORMAL LOW (ref 31.6–35.4)
MCV: 68.6 fL — ABNORMAL LOW (ref 81.4–101.7)
MPV: 9.6 fL (ref 7.5–12.5)
Platelets: 289 Thousand/uL (ref 140–400)
RBC: 5.79 Million/uL — ABNORMAL HIGH (ref 3.80–5.10)
RDW: 17.7 % — ABNORMAL HIGH (ref 11.0–15.0)
WBC: 3.4 Thousand/uL — ABNORMAL LOW (ref 3.8–10.8)

## 2024-01-26 LAB — SEDIMENTATION RATE: Sed Rate: 6 mm/h (ref 0–20)

## 2024-01-26 LAB — C-REACTIVE PROTEIN: CRP: <3 mg/L
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
## 2024-01-29 ENCOUNTER — Ambulatory Visit: Payer: Self-pay | Admitting: Infectious Diseases

## 2024-02-09 NOTE — Procedures (Signed)
 [facility]/Long-Term/Continuous EEG  Date/Time: 02/09/2024 6:59 PM  Performed by: Sherlean Westley Mandril, MD Authorized by: Sherlean Westley Mandril, MD    CONTINUOUS EEG  REFERRING PROVIDER: Malen Ohara, PA  DATES: 02/09/24    HISTORY  29yo woman found unresponsive in bathroom and brought to ED on 02/07/24 for which EEG ordered to evaluate for seizures.   TECHNICAL SUMMARY Electrodes were placed according to the 10-20 international electrode.  Continuous recording with digital video and EEG was performed using the [employer] digital video/EEG system with >80% video. EEG was reviewed for electrographic seizures, interictal discharges, and background activity.  MEDICATIONS: Current Medications[1]  ABBREVIATIONS  LPDs: lateralized periodic discharges. Also known as PLEDs. BIPDs: bilateral, independent periodic discharges. Also known as BIPLEDs. GPDs: generalized periodic discharges. Also known as GPEDs. LRDA: lateralized rhythmic delta activity GRDA: generalized rhythmic delta activity  GSW: generalized spike-wave or generalized sharp-wave. SIRPIDs: stimulus-induced rhythmic, periodic or ictal discharges  BIRDs: brief potentially ictal rhythmic discharges   Plus (+): additional superimposed fast activity, epileptiform discharges, rhythmic delta or other activity, which renders the pattern more ictal-appearing. +F: superimposed fast activity; +R superimposed rhythmic activity; +S: intermixed sharp waves or spikes.   TERMINOLOGY (based on ACNS standardized critical care terminology 2021 version):  Background amplitudes: High >=150uV Normal >=20uV Low 10 - <20uV Suppressed <10uV  Discharge and pattern amplitudes:  Very low <20uV Low 20-49uV Medium 50-149uV High >-150uV  Sporadic epileptiform discharges:  Abundant >= 1/10s Frequent >=1/min but <1/10s Occasional >=1/h but <1/min Rare <1/h  Prevalence of pattern:  Continuous >=90% of record/epoch Abundant 50-89% of  record/epoch Frequent 10-49% of record/epoch Occasional 1-9% of record/epoch Rare <1% of record/epoch  Duration of pattern:  Very long >=1hr Long 10-58min Intermediate 1-9.9 min Brief 10-59s Very brief <10s   Highly Epileptiform bursts: >=2 spikes/sharp waves within >50% of bursts at >1hz  or BIRDs within bursts Identical bursts: initial >=0.5s of bursts appear visually similar in >90% of bursts   Ictal-Interictal Continuum (IIC): refers to an electrographic pattern that does not meet the strict criteria for an electrographic seizure but there is a reasonable chance that it can be contributing to impaired alertness, neurological symptoms, and /or neuronal injury and may warrant a diagnostic treatment trial. It is synonymous with possible electrographic seizure.   Day 2 ( 02/09/24@0700 -1650) During maximal wakefulness, the background was continuous but not well-organized consisting predominantly of delta/theta activity with superimposed faster alpha/beta frequencies ranging between 10-75 V.  No posterior dominant rhythm was identified.  Spontaneous variability and reactivity were present.    There were frequent to abundant runs of generalized medium amplitude rhythmic 1-2 Hz delta activity with frequent superimposed fast.  No definite epileptiform discharges were recorded although occasional sharply contoured generalized theta activity was seen but not definitively epileptiform.    No clinical or electrographic seizures were recorded.    Sleep spindles were seen embedded within the delta activity.   CLASSIFICATION: Abnormal (coma) -continuous slow, generalized -GRDA  IMPRESSION: There was evidence for moderate diffuse/multifocal cerebral dysfunction which is nonspecific as to etiology but can be seen with Toxic/infectious/metabolic derangement and medication effect.  There was no specific evidence for seizures during this recording although this does not completely rule out an  underlying risk for seizures and clinical correlation is required.   I personally interpreted and reported this EEG.  Christian A. Mandril, MD [facility]  Board Certified in Neurology and Epilepsy   Electronically signed by: Sherlean Westley Mandril, MD 02/09/2024 6:59 PM   Summary:  This concludes  >30 hrs of EEG Interictal:  -continuous slow, generalized -GRDA Ictal: none Impression: There was evidence for moderate diffuse/multifocal cerebral dysfunction which is nonspecific as to etiology but can be seen with Toxic/infectious/metabolic derangement and medication effect.  There was no specific evidence for seizures during this recording although this does not completely rule out an underlying risk for seizures and clinical correlation is required. If clinical concern remains for an underlying epilepsy consider repeating EEG when not sedated.   I personally interpreted and reported this EEG.  Christian A. Lawton, MD [facility]  Board Certified in Neurology and Epilepsy   Electronically signed by: Sherlean Westley Lawton, MD 02/09/2024 7:03 PM          [1]  Current Facility-Administered Medications:    acetaminophen  (TYLENOL ) tablet 650 mg, 650 mg, oral, Q6H PRN, Rafael Florin Rosu, NP, 650 mg at 02/08/24 2216   amino acids-protein hydrolysate (PROSTAT SUGAR-FREE) packet 30 mL, 30 mL, Per OG Tube, TID with meals, Norman India Louder, RD, 30 mL at 02/09/24 1701   artificial tears ophthalmic solution 1 drop, 1 drop, Both Eyes, Q6H, Nataly Bullock Osorio, PA-C, 1 drop at 02/09/24 1348   artificial tears ophthalmic solution 1 drop, 1 drop, Both Eyes, PRN, Nataly Bullock Osorio, PA-C, 1 drop at 02/07/24 2351   BisaCODYL  (DULCOLAX) suppository 10 mg, 10 mg, rectal, Daily PRN, Nataly Bullock Osorio, PA-C   buprenorphine -naloxone  (SUBOXONE ) 2-0.5 mg per tablet 2 tablet, 2 tablet, sublingual, Daily, Jennifer Michelle Labrano, PA-C, 2 tablet at 02/09/24 1658    buprenorphine -naloxone  (SUBOXONE ) 8-2 mg per sublingual tablet 1 tablet, 1 tablet, sublingual, BID, Jennifer Michelle Labrano, PA-C, 1 tablet at 02/09/24 0913   [START ON 02/10/2024] cefTRIAXone  (ROCEPHIN ) injection 2 g, 2 g, intravenous, Q24H, Jennifer Michelle Labrano, PA-C   dexmedeTOMIDine  (PRECEDEX ) infusion 400 mcg/100 mL NS (4 mcg/mL) premix, 0-1.5 mcg/kg/hr, intravenous, Continuous, Delon Rosaline Marseille, PA-C, Last Rate: 10.1 mL/hr at 02/09/24 1844, 0.3 mcg/kg/hr at 02/09/24 1844   dextrose  (D50W) 50 % injection 12.5 g, 12.5 g, intravenous, PRN, Nataly Bullock Osorio, PA-C, 12.5 g at 02/08/24 0342   dextrose  (GLUTOSE) 40 % oral gel 15 g, 15 g, oral, PRN, Nataly Bullock Osorio, PA-C   enoxaparin  (LOVENOX ) syringe 40 mg, 40 mg, subcutaneous, At Bedtime, Nataly Bullock Osorio, PA-C, 40 mg at 02/08/24 2032   famotidine  (PEPCID ) tablet 20 mg, 20 mg, enteral tube, BID, Vineet Sood, MD   fentaNYL  (SUBLIMAZE ) injection 50 mcg, 50 mcg, intravenous, Q20 Min PRN, [employer], DO, 50 mcg at 02/08/24 2050   free water  30 mL, 30 mL, Per OG Tube, Q4H SCH, Nataly Bullock Osorio, PA-C   insulin lispro (HumaLOG) injection 0-15 Units, 0-15 Units, subcutaneous, Q4H SCH, Nataly Bullock Osorio, PA-C   levETIRAcetam (KEPPRA) injection 1,000 mg, 1,000 mg, intravenous, BID, Nataly Bullock Osorio, PA-C, 1,000 mg at 02/09/24 9170   polyethylene glycol (GLYCOLAX ) packet 17 g, 17 g, oral, Daily PRN, Nataly Bullock Osorio, PA-C   propofol  (DIPRIVAN ) infusion 10 mg/mL, 0-50 mcg/kg/min, intravenous, Continuous, Stopped at 02/09/24 1103 **AND** [START ON 02/12/2024] Triglycerides, , , Q96H, Prentice Lamar Fiedler, MD   senna (SENOKOT) tablet 8.6 mg, 1 tablet, oral, Daily, Nataly Bullock Osorio, PA-C, 8.6 mg at 02/09/24 0830   Vital High Protein, , Per OG Tube, Continuous, Norman India Louder, RD, Last Rate: 25 mL/hr at 02/09/24 0457, Rate Verify at 02/09/24 0457

## 2024-02-09 NOTE — Progress Notes (Signed)
 Case Management Adult Assessment  CSN: [id number] DOB: [birth year] Service: Critical Care Location: 425/01   Social worker completed assessment based on medical record review. Patient is currently intubated in ICU and pt's hospitalization is a confidential encounter. Social worker will follow to assist with all plans.   Info & Contacts Assessment Completed: Medical Record reviewed, patient/family unavailable The patient's status at this time is:: Unable to communicate Unable to communicate related to:: Intubation Prior to admission, patient resided at: Private residence (per notes was found in a hotel) The patient's decision maker is:: Patient At discharge, will patient return to prior residence: Undetermined, CSW will continue to evaluate (comment) Barriers to education: Physical   Extended Emergency Contact Information Primary Emergency Contact: Dunford,Casey Mobile Phone: [phone] Relation: Mother Secondary Emergency Contact: Dunford,Debra  [place] Home Phone: [phone] Mobile Phone: [phone] Relation: Grandparent  Assessment Is this patient at baseline?: No Was patient independent with ADLs prior to admission?: Yes Was patient independent with mobility prior to admission?: Yes Does this patient have or need any DME?: No Does this patient have or need any home health services?: No Does this patient have or need any personal care services?: No   Social Does patient have a mental health diagnosis?: No Does patient have an acute substance use issue?: Per medical record   Discharge How will patient obtain prescription meds at discharge:: Medicaid Type of Payer Source:: Medicaid How will this patient obtain follow-up care after discharge?: PCP office How will this patient reach the discharge destination?: Family/Friends Is this a Chronic Dialysis patient?: No Patient Discharge Goals of Care: Return to Work/Prior Level of Function  Discharge  Education Discharge Plan Discussed With:: Other (comment) Education Readiness:: Acceptance Education Method:: Explanation Education Response:: Verbalizes Understanding                        Case Management Coordination Status: Coordination In-Progress    Anticipated Discharge Location: To be determined  Vann DELENA Miracle, MSW

## 2024-02-10 ENCOUNTER — Ambulatory Visit: Payer: MEDICAID | Admitting: Infectious Diseases

## 2024-02-10 NOTE — Progress Notes (Signed)
" °   02/10/24 0824  Vent Information  $Vent Daily Charge-Subs Yes  Ventilator Type [facility] Critical [facility] Ventilator  Vent Mode PRVC/PCVVG/VC+  Vent Length of Stay  Ventilation Day(s) 3  Settings  FiO2 Set (%) 40 %  Resp Rate (Set) 22  Vt (Set, mL) 420 mL  PEEP/P Low (set, cm H2O) 5 cm H20  Insp Rise Time (sec) 5 seconds  Trigger Sensitivity Flow (L/min) 2 L/min  Humidification Heat and moisture exchanger  Readings  Peak Inspiratory Pressure (cm H2O) 29 cm H2O  Mean Pressure (cm H2O) 13  Minute Ventilation (L/min) 9.2 L/min  Total Respiratory Rate (bpm) 22  Spontaneous Tidal Volume (mL) 425 mL  I:E Ratio 1;2  Dynamic Compliance (mL/cm H2O) 0 mL/cm H2O  Flow (Obs) (L/min) 60 L/min  Alarms  Insp Pressure High (cm H2O) 45 cm H2O  Insp Pressure Low (cm H2O) 16 cm H2O  PEEP Min 2 cm H2O  RR High 40  RR Low 5  MV High (L/min) 25 L/min  MV Low (L/min) 5 L/min  Apnea Interval (sec) 20 seconds  Alarm Functional and On Yes  Alarm Sound Level % Settings 100 %  Resuscitation Bag/Mask Available Yes with PEEP Valve  RT Airway Charges  $RT Airway Charge Airway management  $RT Airway Management Other airway management (comment)  ETT  7.5 mm  Placement Date/Time: 02/07/24 1847   Tube Size: 7.5 mm  Comments: 24 at the teeth  Secured at (cm) 22  Measured from Teeth  Secured Location Right  Secured by English As A Second Language Teacher in Place Yes  Cuff Pressure Method Minimum occlusion volume  Airway Suctioning/Secretions  Suction Type Endotracheal tube  Suction Device Inline;Catheter  Secretion Amount Moderate  Secretion Color Clear  Secretion Consistency Thin  Suction Tolerance Tolerated well  Extubation Screening  Extubation Screening Adult SBT  B = Both Spontaneous Awakening and Breathing Trials  Was patient receiving mechanical ventilation? Yes  Safety Screen Spontaneous Breathing Trial (SBT)  Proceed with SBT - No exclusion criteria met  Spontaneous Breathing Trial (SBT)  Outcome SBT failed  SBT Failure Criteria Respiratory rate greater than 35/min  SBT/ERT Procedure  Weaning Start Time [phone]  Weaning Stop Time 0830  Total SBT/ERT Time (Calculation) 6 Minutes  SBT/ERT Pressure Support 5 cmH2O  Tidal Volume (VT) - mL 330 mL  Resp Rate 36  RSBI Calculated Value 109.09  Post SBT/ERT Information  Post SBT/ERT Action Failed placed back on previous settings  Name of Provider Notified About SBT/ERT Outcome K.poole,PAC  Cough and Gag Reflex Present  Cuff Leak Absent   "

## 2024-02-10 NOTE — Care Plan (Signed)
 " Problem: Knowledge Deficit Goal: Patient/family/caregiver demonstrates understanding of disease process, treatment plan, medications, and discharge instructions Description: Complete learning assessment and assess knowledge base. Outcome: Not Progressing   Problem: SAFETY - MEDICAL RESTRAINT Goal: Free from restraint(s) (Restraint for Interference with Medical Device) Description: INTERVENTIONS: 1. ONCE/SHIFT or MINIMUM Q12H: Assess and document the continuing need for restraints 2. Q24H: Continued use of restraint requires LIP to perform face to face examination and written order 3. Identify and implement measures to help patient regain control Outcome: Not Progressing   Problem: Health Behavior: Goal: Knowledge of disease or condition will improve by discharge Outcome: Not Progressing Goal: Understanding of proper healthcare maintenance will be met by discharge Description: Understanding of proper healthcare maintenance will be met by discharge Outcome: Not Progressing Goal: Verbalization of signs and symptoms of infection will improve by discharge Outcome: Not Progressing   Problem: Coping: Goal: Ability to verbalize feelings will improve by discharge Outcome: Not Progressing Goal: Ability to verbalize ways to enhance comfort will improve by discharge Outcome: Not Progressing   Problem: Fluid Volume: Goal: Ability to maintain a balanced intake and output will improve by discharge Description: Ability to maintain a balanced intake and output will improve by discharge Outcome: Not Progressing   Problem: Health Behavior Goal: Understanding of treatment plan will improve by discharge Outcome: Not Progressing Goal: Compliance with treatment plan for underlying cause of condition will improve by discharge Outcome: Not Progressing Goal: Identification of resources available to assist in meeting health care needs will improve by discharge Outcome: Not Progressing Goal: Ability to  care for self will improve by discharge Outcome: Not Progressing Goal: Ability to state ways to decrease the risk of falls will improve by discharge Description: Ability to state ways to decrease the risk of falls will improve Outcome: Not Progressing Goal: Ability to state behaviors that decrease the risk for skin breakdown will improve Description: Ability to state behaviors that decrease the risk for skin breakdown will improve Outcome: Not Progressing Goal: Knowledge of disease or condition will improve Description: Knowledge of disease or condition will improve Outcome: Not Progressing   Problem: Urinary Elimination: Goal: Ability to recognize the need to void and respond appropriately will improve by discharge Outcome: Not Progressing Goal: Ability to completely empty bladder with each voiding will improve by discharge Outcome: Not Progressing Goal: Will remain free from infection by discharge Outcome: Not Progressing   Problem: Activity: Goal: Ability to ambulate will improve Description: Ability to ambulate will improve Outcome: Not Progressing Goal: Ability to safely and independently change position in bed will improve Description: Ability to safely and independently change position in bed will improve Outcome: Not Progressing Goal: Range of joint motion will improve Description: Range of joint motion will improve Outcome: Not Progressing   Problem: Bowel/Gastric: Goal: Ability to achieve a regular elimination pattern will return to normal for the patient Description: Ability to achieve a regular elimination pattern will return to normal for the patient Outcome: Not Progressing   Problem: Self-Care: Goal: Ability to perform activities of daily living will improve Description: Ability to perform activities of daily living will improve Outcome: Not Progressing   Problem: Skin Integrity: Goal: Skin integrity will improve Description: Skin integrity will  improve Outcome: Not Progressing   Problem: Tissue Perfusion: Goal: Risk of venous thrombosis will decrease by discharge Description: Risk of venous thrombosis will decrease Outcome: Not Progressing   Problem: Health Behavior: Goal: MCB Ability to state ways to decrease the risk of falls will be  met by discharge Description: Ability to state ways to decrease the risk of falls will improve by discharge Outcome: Not Progressing   "

## 2024-02-10 NOTE — Progress Notes (Signed)
" °   02/10/24 0245  Vent Information  $Vent Daily Charge-Subs Yes  Ventilator Type Hospital Critical Care Ventilator  Vent Mode PRVC/PCVVG/VC+  Vent Maintenance HME changed  Vent Length of Stay  Ventilation Day(s) 3  Settings  FiO2 Set (%) 40 %  Resp Rate (Set) 22  Vt (Set, mL) 420 mL  PEEP/P Low (set, cm H2O) 5 cm H20  Insp Rise Time (sec) 5 seconds  Trigger Sensitivity Flow (L/min) 2 L/min  Humidification Heat and moisture exchanger  I:E Ratio 1:2  Readings  Peak Inspiratory Pressure (cm H2O) 27 cm H2O  Mean Pressure (cm H2O) 12  Minute Ventilation (L/min) 12 L/min  Total Respiratory Rate (bpm) 22  I:E Ratio 1:2  Dynamic Compliance (mL/cm H2O) 19.09 mL/cm H2O  Vt (observed, mL) 424 mL  Vt Mandatory Exp (mL) 420 mL  Alarms  Insp Pressure High (cm H2O) 45 cm H2O  Insp Pressure Low (cm H2O) 16 cm H2O  RR High 40  RR Low 5  MV High (L/min) 25 L/min  MV Low (L/min) 5 L/min  Vt High (mL) 15 mL  Vt Low (mL) 2 mL  Alarm Functional and On Yes  Alarm Sound Level % Settings 100 %  Resuscitation Bag/Mask Available Yes  RT Airway Charges  $RT Airway Charge Airway management  $RT Airway Management Other airway management (comment)  Airways  Airway LDA Endotracheal tube  ETT  7.5 mm  Placement Date/Time: 02/07/24 1847   Tube Size: 7.5 mm  Comments: 24 at the teeth  Secured at (cm) 22  Measured from Teeth  Secured Location Center  Secured by English As A Second Language Teacher in Place Yes  Site Condition Cool;Dry  Cuff Leak Absent  Cuff Pressure Method Minimum occlusion volume  Respiratory Assessment  $ Assessment Charges Assess Only  Respiratory (WDL) X  Respiratory Pattern Vent assisted breaths  Chest Assessment Chest expansion symmetrical  Cough Weak  Suction ET Tube  Bilateral Breath Sounds Rhonchi  Airway Suctioning/Secretions  Suction Type Endotracheal tube  Suction Device Inline  Secretion Amount Moderate  Secretion Color Tan  Secretion Consistency Thick   Suction Tolerance Tolerated well  Suctioning Adverse Effects None   "

## 2024-02-10 NOTE — Care Plan (Signed)
" °  Problem: SAFETY - MEDICAL RESTRAINT Goal: Remains free of injury from restraints (Restraint for Interference with Medical Device) Description: INTERVENTIONS: 1. Determine that other, less restrictive measures have been tried or would not be effective before applying the restraint 2. Evaluate the patient's condition at the time of restraint application 3. Inform patient/family regarding the reason for restraint 4. Q2H: Monitor safety, psychosocial status, comfort, nutrition and hydration Outcome: Progressing Goal: Free from restraint(s) (Restraint for Interference with Medical Device) Description: INTERVENTIONS: 1. ONCE/SHIFT or MINIMUM Q12H: Assess and document the continuing need for restraints 2. Q24H: Continued use of restraint requires LIP to perform face to face examination and written order 3. Identify and implement measures to help patient regain control Outcome: Progressing   "
